# Patient Record
Sex: Female | Born: 1965
Health system: Southern US, Community
[De-identification: ages and names within clinical notes are randomized; demographics above are authoritative.]

## PROBLEM LIST (undated history)

## (undated) DIAGNOSIS — Z87442 Personal history of urinary calculi: Secondary | ICD-10-CM

## (undated) DIAGNOSIS — H269 Unspecified cataract: Secondary | ICD-10-CM

## (undated) DIAGNOSIS — N189 Chronic kidney disease, unspecified: Secondary | ICD-10-CM

## (undated) DIAGNOSIS — R112 Nausea with vomiting, unspecified: Secondary | ICD-10-CM

## (undated) DIAGNOSIS — Z973 Presence of spectacles and contact lenses: Secondary | ICD-10-CM

## (undated) DIAGNOSIS — C801 Malignant (primary) neoplasm, unspecified: Secondary | ICD-10-CM

## (undated) DIAGNOSIS — G971 Other reaction to spinal and lumbar puncture: Secondary | ICD-10-CM

## (undated) DIAGNOSIS — R55 Syncope and collapse: Secondary | ICD-10-CM

## (undated) DIAGNOSIS — K589 Irritable bowel syndrome without diarrhea: Secondary | ICD-10-CM

## (undated) DIAGNOSIS — J42 Unspecified chronic bronchitis: Secondary | ICD-10-CM

## (undated) DIAGNOSIS — E119 Type 2 diabetes mellitus without complications: Secondary | ICD-10-CM

## (undated) DIAGNOSIS — Z8739 Personal history of other diseases of the musculoskeletal system and connective tissue: Secondary | ICD-10-CM

## (undated) DIAGNOSIS — D509 Iron deficiency anemia, unspecified: Secondary | ICD-10-CM

## (undated) DIAGNOSIS — E7800 Pure hypercholesterolemia, unspecified: Secondary | ICD-10-CM

## (undated) DIAGNOSIS — F419 Anxiety disorder, unspecified: Secondary | ICD-10-CM

## (undated) DIAGNOSIS — G6181 Chronic inflammatory demyelinating polyneuritis: Secondary | ICD-10-CM

## (undated) DIAGNOSIS — G4730 Sleep apnea, unspecified: Secondary | ICD-10-CM

## (undated) DIAGNOSIS — E669 Obesity, unspecified: Secondary | ICD-10-CM

## (undated) DIAGNOSIS — R0600 Dyspnea, unspecified: Secondary | ICD-10-CM

## (undated) DIAGNOSIS — M797 Fibromyalgia: Secondary | ICD-10-CM

## (undated) DIAGNOSIS — E039 Hypothyroidism, unspecified: Secondary | ICD-10-CM

## (undated) DIAGNOSIS — I209 Angina pectoris, unspecified: Secondary | ICD-10-CM

## (undated) DIAGNOSIS — K219 Gastro-esophageal reflux disease without esophagitis: Secondary | ICD-10-CM

## (undated) DIAGNOSIS — Z9889 Other specified postprocedural states: Secondary | ICD-10-CM

## (undated) DIAGNOSIS — I10 Essential (primary) hypertension: Secondary | ICD-10-CM

## (undated) DIAGNOSIS — R000 Tachycardia, unspecified: Secondary | ICD-10-CM

## (undated) DIAGNOSIS — R011 Cardiac murmur, unspecified: Secondary | ICD-10-CM

## (undated) DIAGNOSIS — Z8719 Personal history of other diseases of the digestive system: Secondary | ICD-10-CM

## (undated) DIAGNOSIS — D134 Benign neoplasm of liver: Secondary | ICD-10-CM

## (undated) DIAGNOSIS — G43909 Migraine, unspecified, not intractable, without status migrainosus: Secondary | ICD-10-CM

## (undated) DIAGNOSIS — R809 Proteinuria, unspecified: Secondary | ICD-10-CM

## (undated) HISTORY — PX: OTHER SURGICAL HISTORY: SHX169

## (undated) HISTORY — PX: ABDOMINAL HYSTERECTOMY: SHX81

## (undated) HISTORY — PX: BACK SURGERY: SHX140

## (undated) HISTORY — PX: CYSTOSCOPY WITH URETEROSCOPY, STONE BASKETRY AND STENT PLACEMENT: SHX6378

---

## 1998-06-15 ENCOUNTER — Ambulatory Visit (HOSPITAL_COMMUNITY): Admission: RE | Admit: 1998-06-15 | Discharge: 1998-06-15 | Payer: Self-pay | Admitting: Nephrology

## 1999-01-25 ENCOUNTER — Ambulatory Visit (HOSPITAL_COMMUNITY): Admission: RE | Admit: 1999-01-25 | Discharge: 1999-01-25 | Payer: Self-pay | Admitting: Nephrology

## 1999-01-25 ENCOUNTER — Encounter: Payer: Self-pay | Admitting: Nephrology

## 1999-05-29 ENCOUNTER — Encounter: Admission: RE | Admit: 1999-05-29 | Discharge: 1999-07-24 | Payer: Self-pay | Admitting: *Deleted

## 1999-07-10 ENCOUNTER — Encounter: Payer: Self-pay | Admitting: Nephrology

## 1999-07-10 ENCOUNTER — Ambulatory Visit (HOSPITAL_COMMUNITY): Admission: RE | Admit: 1999-07-10 | Discharge: 1999-07-10 | Payer: Self-pay | Admitting: Critical Care Medicine

## 1999-08-15 ENCOUNTER — Other Ambulatory Visit: Admission: RE | Admit: 1999-08-15 | Discharge: 1999-08-15 | Payer: Self-pay | Admitting: Obstetrics and Gynecology

## 1999-11-05 ENCOUNTER — Emergency Department (HOSPITAL_COMMUNITY): Admission: EM | Admit: 1999-11-05 | Discharge: 1999-11-05 | Payer: Self-pay | Admitting: Emergency Medicine

## 1999-11-05 ENCOUNTER — Encounter: Payer: Self-pay | Admitting: Emergency Medicine

## 1999-11-13 ENCOUNTER — Ambulatory Visit (HOSPITAL_COMMUNITY): Admission: RE | Admit: 1999-11-13 | Discharge: 1999-11-13 | Payer: Self-pay | Admitting: *Deleted

## 1999-11-21 ENCOUNTER — Encounter: Admission: RE | Admit: 1999-11-21 | Discharge: 1999-12-06 | Payer: Self-pay | Admitting: *Deleted

## 2000-06-10 ENCOUNTER — Encounter: Payer: Self-pay | Admitting: Emergency Medicine

## 2000-06-10 ENCOUNTER — Emergency Department (HOSPITAL_COMMUNITY): Admission: EM | Admit: 2000-06-10 | Discharge: 2000-06-10 | Payer: Self-pay | Admitting: Emergency Medicine

## 2000-08-14 ENCOUNTER — Other Ambulatory Visit: Admission: RE | Admit: 2000-08-14 | Discharge: 2000-08-14 | Payer: Self-pay | Admitting: Obstetrics and Gynecology

## 2000-10-23 ENCOUNTER — Encounter: Payer: Self-pay | Admitting: Nephrology

## 2000-10-23 ENCOUNTER — Encounter: Admission: RE | Admit: 2000-10-23 | Discharge: 2000-10-23 | Payer: Self-pay | Admitting: Nephrology

## 2001-10-27 ENCOUNTER — Encounter: Payer: Self-pay | Admitting: Nephrology

## 2001-10-27 ENCOUNTER — Encounter: Admission: RE | Admit: 2001-10-27 | Discharge: 2001-10-27 | Payer: Self-pay | Admitting: Nephrology

## 2002-02-08 ENCOUNTER — Emergency Department (HOSPITAL_COMMUNITY): Admission: EM | Admit: 2002-02-08 | Discharge: 2002-02-08 | Payer: Self-pay | Admitting: Emergency Medicine

## 2003-07-07 ENCOUNTER — Encounter: Payer: Self-pay | Admitting: Nephrology

## 2003-07-07 ENCOUNTER — Encounter: Admission: RE | Admit: 2003-07-07 | Discharge: 2003-07-07 | Payer: Self-pay | Admitting: Nephrology

## 2004-05-10 HISTORY — PX: LAPAROSCOPIC CHOLECYSTECTOMY: SUR755

## 2004-05-26 ENCOUNTER — Encounter (INDEPENDENT_AMBULATORY_CARE_PROVIDER_SITE_OTHER): Payer: Self-pay | Admitting: Specialist

## 2004-05-26 ENCOUNTER — Ambulatory Visit (HOSPITAL_COMMUNITY): Admission: RE | Admit: 2004-05-26 | Discharge: 2004-05-27 | Payer: Self-pay | Admitting: General Surgery

## 2004-07-17 ENCOUNTER — Encounter: Admission: RE | Admit: 2004-07-17 | Discharge: 2004-07-17 | Payer: Self-pay | Admitting: Nephrology

## 2005-01-20 ENCOUNTER — Encounter: Admission: RE | Admit: 2005-01-20 | Discharge: 2005-01-20 | Payer: Self-pay | Admitting: Nephrology

## 2005-09-27 ENCOUNTER — Ambulatory Visit (HOSPITAL_COMMUNITY): Admission: RE | Admit: 2005-09-27 | Discharge: 2005-09-27 | Payer: Self-pay | Admitting: Nephrology

## 2005-10-30 ENCOUNTER — Ambulatory Visit: Payer: Self-pay | Admitting: Internal Medicine

## 2006-03-08 ENCOUNTER — Inpatient Hospital Stay (HOSPITAL_COMMUNITY): Admission: AD | Admit: 2006-03-08 | Discharge: 2006-03-10 | Payer: Self-pay | Admitting: Nephrology

## 2006-07-13 ENCOUNTER — Inpatient Hospital Stay (HOSPITAL_COMMUNITY): Admission: EM | Admit: 2006-07-13 | Discharge: 2006-07-17 | Payer: Self-pay | Admitting: Family Medicine

## 2006-07-16 ENCOUNTER — Ambulatory Visit: Payer: Self-pay | Admitting: Infectious Diseases

## 2006-09-16 ENCOUNTER — Ambulatory Visit: Payer: Self-pay | Admitting: Oncology

## 2006-11-13 ENCOUNTER — Encounter (HOSPITAL_COMMUNITY): Admission: RE | Admit: 2006-11-13 | Discharge: 2006-11-13 | Payer: Self-pay | Admitting: Endocrinology

## 2008-06-09 ENCOUNTER — Ambulatory Visit (HOSPITAL_BASED_OUTPATIENT_CLINIC_OR_DEPARTMENT_OTHER): Admission: RE | Admit: 2008-06-09 | Discharge: 2008-06-09 | Payer: Self-pay | Admitting: Neurology

## 2008-06-23 ENCOUNTER — Ambulatory Visit: Payer: Self-pay | Admitting: Pulmonary Disease

## 2008-08-05 ENCOUNTER — Encounter: Admission: RE | Admit: 2008-08-05 | Discharge: 2008-08-05 | Payer: Self-pay | Admitting: Occupational Medicine

## 2008-08-10 ENCOUNTER — Encounter: Admission: RE | Admit: 2008-08-10 | Discharge: 2008-08-10 | Payer: Self-pay | Admitting: Internal Medicine

## 2008-08-11 ENCOUNTER — Ambulatory Visit (HOSPITAL_COMMUNITY): Admission: RE | Admit: 2008-08-11 | Discharge: 2008-08-11 | Payer: Self-pay | Admitting: Nephrology

## 2008-08-12 ENCOUNTER — Encounter: Admission: RE | Admit: 2008-08-12 | Discharge: 2008-09-06 | Payer: Self-pay | Admitting: Internal Medicine

## 2009-03-14 ENCOUNTER — Encounter: Payer: Self-pay | Admitting: Cardiology

## 2009-04-04 ENCOUNTER — Encounter: Payer: Self-pay | Admitting: Cardiology

## 2009-04-09 HISTORY — PX: CARDIAC CATHETERIZATION: SHX172

## 2009-04-14 ENCOUNTER — Ambulatory Visit: Payer: Self-pay | Admitting: Cardiology

## 2009-04-14 ENCOUNTER — Encounter (INDEPENDENT_AMBULATORY_CARE_PROVIDER_SITE_OTHER): Payer: Self-pay

## 2009-04-14 DIAGNOSIS — J449 Chronic obstructive pulmonary disease, unspecified: Secondary | ICD-10-CM | POA: Insufficient documentation

## 2009-04-14 DIAGNOSIS — F341 Dysthymic disorder: Secondary | ICD-10-CM | POA: Insufficient documentation

## 2009-04-14 DIAGNOSIS — I10 Essential (primary) hypertension: Secondary | ICD-10-CM | POA: Insufficient documentation

## 2009-04-14 DIAGNOSIS — J4530 Mild persistent asthma, uncomplicated: Secondary | ICD-10-CM | POA: Insufficient documentation

## 2009-04-14 DIAGNOSIS — E119 Type 2 diabetes mellitus without complications: Secondary | ICD-10-CM | POA: Insufficient documentation

## 2009-04-14 DIAGNOSIS — R079 Chest pain, unspecified: Secondary | ICD-10-CM | POA: Insufficient documentation

## 2009-04-14 DIAGNOSIS — E669 Obesity, unspecified: Secondary | ICD-10-CM | POA: Insufficient documentation

## 2009-04-14 DIAGNOSIS — Z8679 Personal history of other diseases of the circulatory system: Secondary | ICD-10-CM | POA: Insufficient documentation

## 2009-04-14 DIAGNOSIS — E1129 Type 2 diabetes mellitus with other diabetic kidney complication: Secondary | ICD-10-CM | POA: Insufficient documentation

## 2009-04-14 DIAGNOSIS — E785 Hyperlipidemia, unspecified: Secondary | ICD-10-CM | POA: Insufficient documentation

## 2009-04-20 ENCOUNTER — Inpatient Hospital Stay (HOSPITAL_BASED_OUTPATIENT_CLINIC_OR_DEPARTMENT_OTHER): Admission: RE | Admit: 2009-04-20 | Discharge: 2009-04-20 | Payer: Self-pay | Admitting: Cardiology

## 2009-04-20 ENCOUNTER — Ambulatory Visit: Payer: Self-pay | Admitting: Cardiology

## 2009-04-28 ENCOUNTER — Ambulatory Visit: Payer: Self-pay | Admitting: Cardiology

## 2009-05-26 ENCOUNTER — Ambulatory Visit (HOSPITAL_COMMUNITY): Admission: RE | Admit: 2009-05-26 | Discharge: 2009-05-26 | Payer: Self-pay | Admitting: Rheumatology

## 2009-07-01 ENCOUNTER — Inpatient Hospital Stay (HOSPITAL_COMMUNITY): Admission: EM | Admit: 2009-07-01 | Discharge: 2009-07-04 | Payer: Self-pay | Admitting: Emergency Medicine

## 2009-07-13 ENCOUNTER — Ambulatory Visit: Payer: Self-pay | Admitting: Internal Medicine

## 2009-07-14 ENCOUNTER — Ambulatory Visit: Payer: Self-pay | Admitting: Cardiology

## 2010-02-09 ENCOUNTER — Emergency Department (HOSPITAL_COMMUNITY): Admission: EM | Admit: 2010-02-09 | Discharge: 2010-02-09 | Payer: Self-pay | Admitting: Family Medicine

## 2010-06-22 ENCOUNTER — Ambulatory Visit (HOSPITAL_COMMUNITY): Admission: RE | Admit: 2010-06-22 | Discharge: 2010-06-22 | Payer: Self-pay | Admitting: Physical Therapy

## 2010-08-02 IMAGING — CR DG CHEST 1V PORT
1 series · 1 of 1 positions shown · non-contrast
Comparison: 07/14/2006.

CLINICAL DATA: Tachycardia.  Diabetic.  High blood pressure.

PORTABLE CHEST - 1 VIEW

[AP]
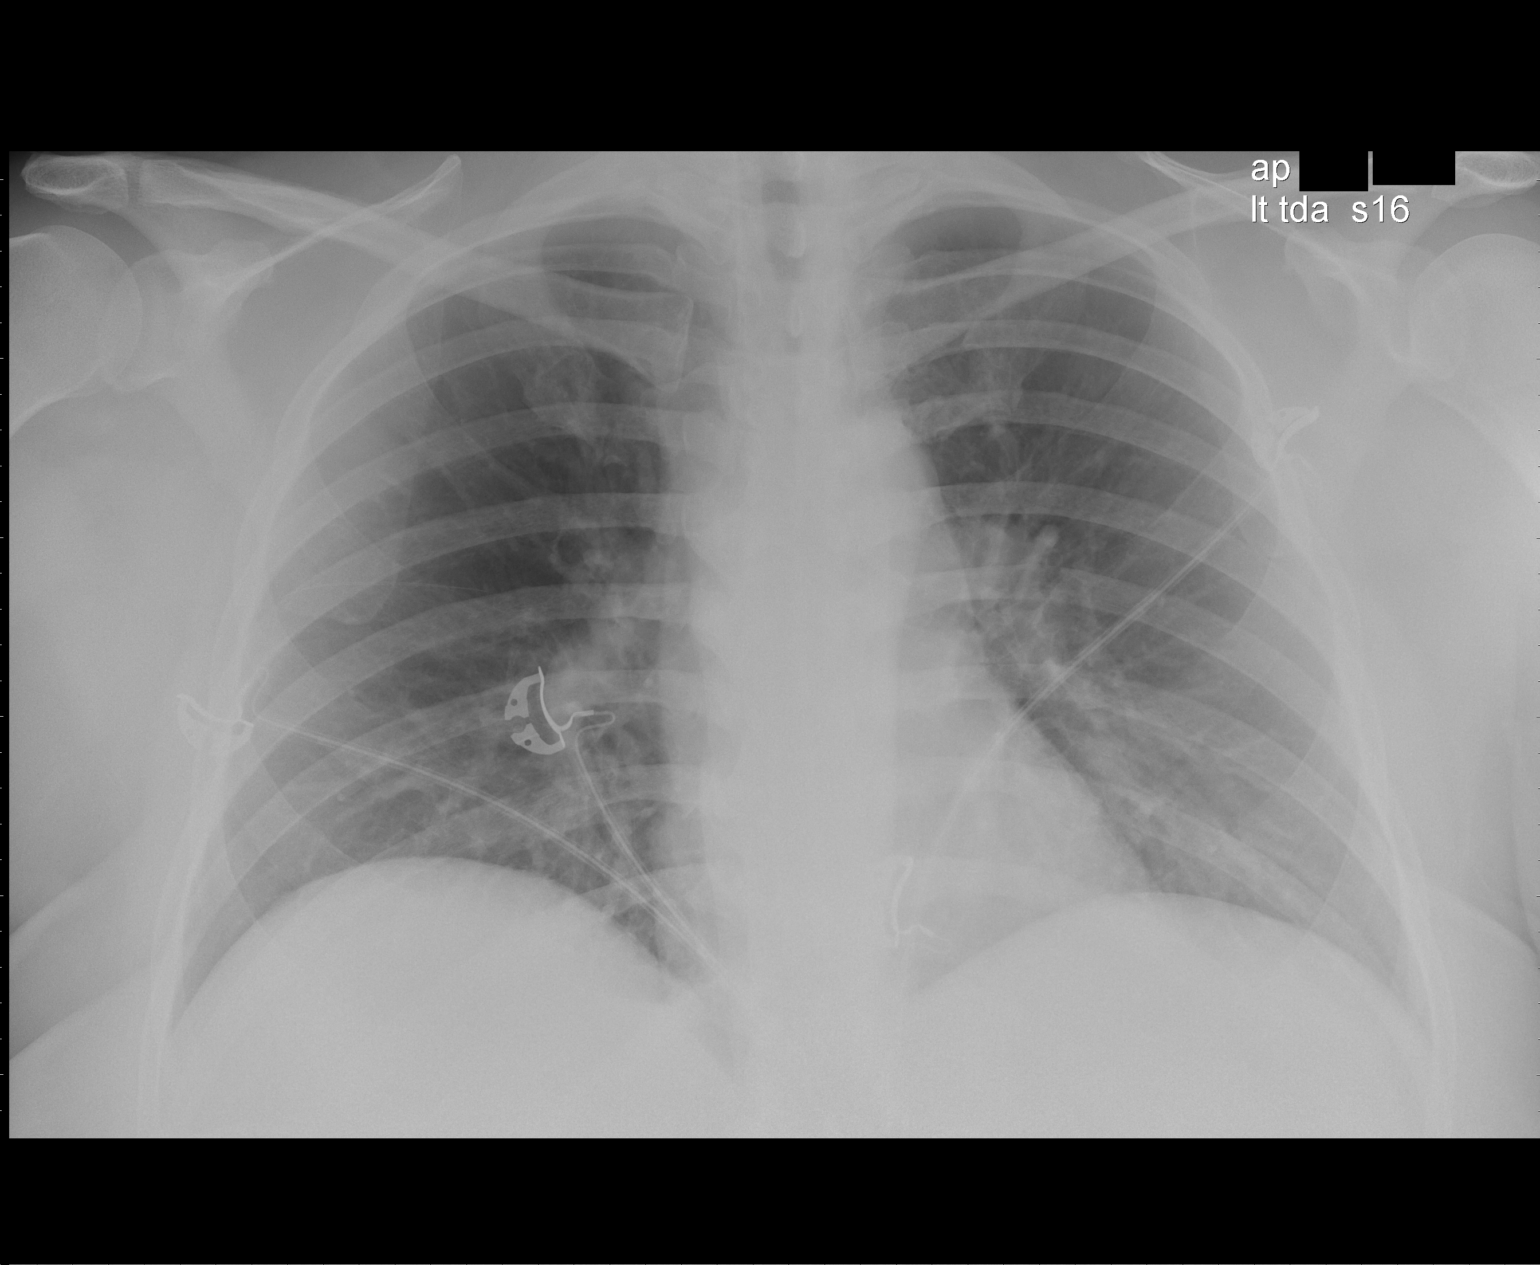

[1 of 1 positions shown; findings below may reference images not displayed]

FINDINGS: Central pulmonary vascular congestion. No infiltrate,
congestive heart failure or pneumothorax.  Heart size within
normal limits. The patient would eventually benefit from follow-up
two-view chest with cardiac leads removed.  Mild prominence of the
aortic knob.
IMPRESSION: Mild central pulmonary vascular prominence.

## 2010-09-08 ENCOUNTER — Ambulatory Visit (HOSPITAL_COMMUNITY): Admission: RE | Admit: 2010-09-08 | Discharge: 2010-09-08 | Payer: Self-pay | Admitting: Nephrology

## 2010-11-25 ENCOUNTER — Emergency Department (HOSPITAL_COMMUNITY)
Admission: EM | Admit: 2010-11-25 | Discharge: 2010-11-25 | Payer: Self-pay | Source: Home / Self Care | Admitting: Family Medicine

## 2010-11-27 ENCOUNTER — Emergency Department (HOSPITAL_COMMUNITY)
Admission: EM | Admit: 2010-11-27 | Discharge: 2010-11-27 | Payer: Self-pay | Source: Home / Self Care | Admitting: Emergency Medicine

## 2010-12-31 ENCOUNTER — Encounter: Payer: Self-pay | Admitting: Nephrology

## 2011-01-07 LAB — CONVERTED CEMR LAB
BUN: 10 mg/dL (ref 6–23)
Basophils Absolute: 0 10*3/uL (ref 0.0–0.1)
Basophils Relative: 0.6 % (ref 0.0–3.0)
CO2: 27 meq/L (ref 19–32)
Calcium: 9.6 mg/dL (ref 8.4–10.5)
Chloride: 104 meq/L (ref 96–112)
Creatinine, Ser: 0.7 mg/dL (ref 0.4–1.2)
Eosinophils Absolute: 0.1 10*3/uL (ref 0.0–0.7)
Eosinophils Relative: 1.3 % (ref 0.0–5.0)
GFR calc non Af Amer: 97.73 mL/min (ref >60)
Glucose, Bld: 60 mg/dL — ABNORMAL LOW (ref 70–99)
HCT: 34.3 % — ABNORMAL LOW (ref 36.0–46.0)
Hemoglobin: 11.4 g/dL — ABNORMAL LOW (ref 12.0–15.0)
INR: 1 (ref 0.8–1.0)
Lymphocytes Relative: 34.3 % (ref 12.0–46.0)
Lymphs Abs: 1.7 10*3/uL (ref 0.7–4.0)
MCHC: 33.3 g/dL (ref 30.0–36.0)
MCV: 79.1 fL (ref 78.0–100.0)
Monocytes Absolute: 0.5 10*3/uL (ref 0.1–1.0)
Monocytes Relative: 10 % (ref 3.0–12.0)
Neutro Abs: 2.7 10*3/uL (ref 1.4–7.7)
Neutrophils Relative %: 53.8 % (ref 43.0–77.0)
Platelets: 278 10*3/uL (ref 150.0–400.0)
Potassium: 3.5 meq/L (ref 3.5–5.1)
Prothrombin Time: 11.3 s (ref 10.9–13.3)
RBC: 4.33 M/uL (ref 3.87–5.11)
RDW: 14.6 % (ref 11.5–14.6)
Sodium: 140 meq/L (ref 135–145)
WBC: 5 10*3/uL (ref 4.5–10.5)

## 2011-01-11 NOTE — Assessment & Plan Note (Signed)
Summary: f21m   Visit Type:  Follow-up  CC:  Post-hospital.  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  Felt like she would pass out.  BP was 115/70, but HR was 150.  Had temp of 99.  Took Toprol, and called out at work.  She went to the ED at [facility] and spiked a temp to 101.8.  BP was 90/50 and 80/50, but got fluids.  Got tylenol, and fluids,  Was on 2600 on Friday until Monday.  Was on service of Dr. Harvie Junior.  She was doing well until this.  Was signed out as possibly having RMSF.  Treated with antibiotics.  Current Medications (verified): 1)  Aspirin 81 Mg Tbec (Aspirin) .... Take One Tablet By Mouth Daily 2)  Lasix 40 Mg Tabs (Furosemide) .... Take 1 Tablet By Mouth Once A Day 3)  Toprol Xl 50 Mg Xr24h-Tab (Metoprolol Succinate) .... Take 1 Tablet By Mouth Two Times A Day 4)  Prinivil 40mg  Tablet .... Take 1 Tablet By Mouth Two Times A Day 5)  Glucophage 1000 Mg Tabs (Metformin Hcl) .... Take 1 Tablet By Mouth Two Times A Day 6)  Actos 45 Mg Tabs (Pioglitazone Hcl) .... Take 1 Tablet By Mouth Once A Day 7)  Victoza 1.8 Mcg .... Subcutaneously Daily 8)  Humulin R U-500 (Concentrated) 500 Unit/ml Soln (Insulin Regular Human) .... As Directed 9)  Tricor 145 Mg Tabs (Fenofibrate) .... Take 1 Tablet By Mouth Once A Day 10)  Lovaza 1 Gm Caps (Omega-3-Acid Ethyl Esters) .... 4 Capsules Daily 11)  Crestor 20 Mg Tabs (Rosuvastatin Calcium) .... Take One Tablet By Mouth Daily. 12)  Welchol 625 Mg Tabs (Colesevelam Hcl) .... 6 Tablets Daily 13)  Vitamin D 50,000 Units Cap .... One Capsule 3 Times A Week 14)  Nexium 40 Mg Cpdr (Esomeprazole Magnesium) .... Take 1 Capsule By Mouth Once A Day 15)  Bentyl 10 Mg Caps (Dicyclomine Hcl) .... Take 1 Cap Two Times A Day or Three Times A Day 16)  Levsin 0.5 Mg/ml Soln (Hyoscyamine Sulfate) .... 2 Tablets Two Times A Day or Qid 17)  Prozac 40 Mg Caps (Fluoxetine Hcl) .... Take 1 Capsule By Mouth Once A Day 18)   Zyrtec Allergy 10 Mg Tabs (Cetirizine Hcl) .... Take 1 Tablet By Mouth Once A Day At Bedtime 19)  Symbicort 160-4.5 Mcg/act Aero (Budesonide-Formoterol Fumarate) .Marland Kitchen.. 1 Puff Two Times A Day 20)  Xopenex Hfa 45 Mcg/act Aero (Levalbuterol Tartrate) .... 2 Puffs Q. 4-6 Hours As Needed 21)  Tums Ultra 1000 1000 Mg Chew (Calcium Carbonate Antacid) .... 2 Tabs At Bedtime 22)  L-Lysine Hcl 500 Mg Caps (Lysine Hcl) .... Take 1 Capsule By Mouth Once A Day At Bedtime 23)  Centrum Silver  Tabs (Multiple Vitamins-Minerals) .... Take 1 Tablet By Mouth Once A Day At Bedtime  Allergies: 1)  ! Penicillin 2)  ! * Avelox 3)  ! Biaxin 4)  ! * Latex 5)  ! * Milk  Vital Signs:  Patient profile:   45 year old female Height:      66 inches Weight:      264 pounds BMI:     42.76 Pulse rate:   95 / minute Pulse rhythm:   regular Resp:     20 per minute BP sitting:   128 / 60  (left arm) Cuff size:   large  Vitals Entered By: Vikki Ports (July 14, 2009  2:29 PM)  Physical Exam  General:  Well developed, well nourished, in no acute distress. Head:  normocephalic and atraumatic Lungs:  Clear bilaterally to auscultation and percussion. Heart:  Non-displaced PMI, chest non-tender; regular rate and rhythm, S1, S2 without murmurs, rubs or gallops. Carotid upstroke normal, no bruit. Normal abdominal aortic size, no bruits. Femorals normal pulses, no bruits. Pedals normal pulses. No edema, no varicosities. Extremities:  No clubbing or cyanosis.   EKG  Procedure date:  07/14/2009  Findings:      NSR.  Slight QT prolongation.  Impression & Recommendations:  Problem # 1:  CHEST PAIN (ICD-786.50)  Non further.  Stable. Her updated medication list for this problem includes:    Aspirin 81 Mg Tbec (Aspirin) .Marland Kitchen... Take one tablet by mouth daily    Toprol Xl 50 Mg Xr24h-tab (Metoprolol succinate) .Marland Kitchen... Take 1 tablet by mouth two times a day    Prinivil 20 Mg Tabs (Lisinopril) .Marland Kitchen... Take one tablet by  mouth daily  Her updated medication list for this problem includes:    Aspirin 81 Mg Tbec (Aspirin) .Marland Kitchen... Take one tablet by mouth daily    Toprol Xl 50 Mg Xr24h-tab (Metoprolol succinate) .Marland Kitchen... Take 1 tablet by mouth two times a day    Prinivil 20 Mg Tabs (Lisinopril) .Marland Kitchen... Take one tablet by mouth daily  Orders: EKG w/ Interpretation (93000)  Problem # 2:  HYPERTENSION (ICD-401.9)  Meds held in hospital.  Can restart Prinivil, and then check BP daily, with slow uptitration as needed.  Suggested followup with Dr. Darrick Penna. Her updated medication list for this problem includes:    Aspirin 81 Mg Tbec (Aspirin) .Marland Kitchen... Take one tablet by mouth daily    Lasix 40 Mg Tabs (Furosemide) .Marland Kitchen... Take 1/2 tablet by mouth once a day    Toprol Xl 50 Mg Xr24h-tab (Metoprolol succinate) .Marland Kitchen... Take 1 tablet by mouth two times a day    Prinivil 20 Mg Tabs (Lisinopril) .Marland Kitchen... Take one tablet by mouth daily  Orders: EKG w/ Interpretation (93000)  Patient Instructions: 1)  Your physician recommends that you schedule a follow-up appointment as needed. 2)  Your physician has recommended you make the following change in your medication: Decrease Prinivil to 20mg  once a day, Cut current Furosemide dose in half 3)  Your physician has requested that you regularly monitor and record your blood pressure readings at home.  Please use the same machine at the same time of day to check your readings and record them to bring to your follow-up visit.

## 2011-01-11 NOTE — Letter (Signed)
Summary: Cardiac Catheterization Instructions- [facility]  [facility], Main Office  [address]   [place], [place] [address]   Phone: [phone]  Fax: [phone]     04/14/2009 MRN: [id number]  [facility] [address] [place], [place]  [address]  Dear [name],   You are scheduled for a Cardiac Catheterization on Wednesday Apr 20, 2009 with Dr.Stuckey.  Please arrive to the 1st floor of the Heart and Vascular Center at [facility] at 9:30 am on the day of your procedure. Please do not arrive before 6:30 a.m. Call the Heart and Vascular Center at [phone] if you are unable to make your appointmnet. The Code to get into the parking garage under the building is 0070. Take the elevators to the 1st floor. You must have someone to drive you home. Someone must be with you for the first 24 hours after you arrive home. Please wear clothes that are easy to get on and off and wear slip-on shoes. Do not eat or drink after midnight except water with your medications that morning. Bring all your medications and current insurance cards with you.  _x__ DO NOT take these medications before your procedure: DO NOT TAKE LASIX, ACTOS THE MORNING OF PROCEDURE. DO NOT TAKE GLUCOPHAGE THE NIGHT BEFORE, MORNING OF, OR 48 HOURS AFTER PROCEDURE. DO NOT TAKE INSULIN THE MORNING OF PROCEDURE, IF YOU TAKE INSULIN AT NIGHT TAKE HALF OF YOUR NORMAL DOSE.  __X_ Make sure you take your aspirin.  __X_ You may take ALL of your medications with water that morning. ________________________________________________________________________________________________________________________________  ___ DO NOT take ANY medications before your procedure.  ___ Pre-med instructions:  ________________________________________________________________________________________________________________________________  The usual length of stay after your procedure is 2 to 3 hours. This can  vary.  If you have any questions, please call the office at the number listed above.   Julieta Gutting, RN, BSN

## 2011-01-11 NOTE — Assessment & Plan Note (Signed)
Summary: f30m   Visit Type:  Follow-up  CC:  Chest pains and Sob.  History of Present Illness: Still has some chest pain and is short of breath.  Patient is less anxious about the test.  Study reviewed in the office today in detail anatomically.  Notices chest discomfort more when she is really stressed.  She pulled three twelves, and pain was worse at work.  Alot of it is stress related.  She started paying attention to the pattern, and noticed it was associated with this.  Now improved.  Cath revealed no significant evidence of CAD.  Current Medications (verified): 1)  Aspirin 81 Mg Tbec (Aspirin) .... Take One Tablet By Mouth Daily 2)  Lasix 40 Mg Tabs (Furosemide) .... Take 1 Tablet By Mouth Once A Day 3)  Toprol Xl 50 Mg Xr24h-Tab (Metoprolol Succinate) .... Take 1 Tablet By Mouth Two Times A Day 4)  Prinivil 40mg  Tablet .... Take 1 Tablet By Mouth Two Times A Day 5)  Glucophage 1000 Mg Tabs (Metformin Hcl) .... Take 1 Tablet By Mouth Two Times A Day 6)  Actos 45 Mg Tabs (Pioglitazone Hcl) .... Take 1 Tablet By Mouth Once A Day 7)  Victoza 1.8 Mcg .... Subcutaneously Daily 8)  Humulin R U-500 (Concentrated) 500 Unit/ml Soln (Insulin Regular Human) .... As Directed 9)  Tricor 145 Mg Tabs (Fenofibrate) .... Take 1 Tablet By Mouth Once A Day 10)  Lovaza 1 Gm Caps (Omega-3-Acid Ethyl Esters) .... 4 Capsules Daily 11)  Crestor 20 Mg Tabs (Rosuvastatin Calcium) .... Take One Tablet By Mouth Daily. 12)  Welchol 625 Mg Tabs (Colesevelam Hcl) .... 6 Tablets Daily 13)  Vitamin D 50,000 Units Cap .... One Capsule 3 Times A Week 14)  Nexium 40 Mg Cpdr (Esomeprazole Magnesium) .... Take 1 Capsule By Mouth Once A Day 15)  Bentyl 10 Mg Caps (Dicyclomine Hcl) .... Take 1 Cap Two Times A Day or Three Times A Day 16)  Levsin 0.5 Mg/ml Soln (Hyoscyamine Sulfate) .... 2 Tablets Two Times A Day or Qid 17)  Prozac 40 Mg Caps (Fluoxetine Hcl) .... Take 1 Capsule By Mouth Once A Day 18)  Zyrtec Allergy 10 Mg  Tabs (Cetirizine Hcl) .... Take 1 Tablet By Mouth Once A Day At Bedtime 19)  Symbicort 160-4.5 Mcg/act Aero (Budesonide-Formoterol Fumarate) .Marland Kitchen.. 1 Puff Two Times A Day 20)  Xopenex Hfa 45 Mcg/act Aero (Levalbuterol Tartrate) .... 2 Puffs Q. 4-6 Hours As Needed 21)  Tums Ultra 1000 1000 Mg Chew (Calcium Carbonate Antacid) .... 2 Tabs At Bedtime 22)  L-Lysine Hcl 500 Mg Caps (Lysine Hcl) .... Take 1 Capsule By Mouth Once A Day At Bedtime 23)  Centrum Silver  Tabs (Multiple Vitamins-Minerals) .... Take 1 Tablet By Mouth Once A Day At Bedtime  Allergies: 1)  ! Penicillin 2)  ! * Avelox 3)  ! Biaxin 4)  ! * Latex 5)  ! * Milk  Vital Signs:  Patient profile:   45 year old female Height:      66 inches Weight:      255.25 pounds BMI:     41.35 Pulse rate:   104 / minute Pulse rhythm:   irregular Resp:     18 per minute BP sitting:   110 / 70  (left arm) Cuff size:   large  Vitals Entered By: Vikki Ports (Apr 28, 2009 5:10 PM)  Physical Exam  General:  Well developed, well nourished, in no acute distress. Lungs:  Clear  to a and p Heart:  Non-displaced PMI, chest non-tender; regular rate and rhythm, S1, S2 without murmurs, rubs or gallops. Carotid upstroke normal, no bruit. Normal abdominal aortic size, no bruits. Femorals normal pulses, no bruits. Pedals normal pulses. No edema, no varicosities. Extremities:  No clubbing or cyanosis.  Groin stable   EKG  Procedure date:  04/28/2009  Findings:      Sinus tachycardia  Impression & Recommendations:  Problem # 1:  CHEST PAIN (ICD-786.50) Continue current medications.  Cath study with no definite evidence of CAD.  See report.  Did encourage followup with GI, discussion about various potential causes, we instructed her to reduce her [facility] use.  Encouarged regular exercise with parameters, and 2 month followup.  Followup also with Dr. Darrick Penna.  EKG w/ Interpretation (93000)  Problem # 2:  TACHYCARDIA, HX OF  (ICD-V12.50) As above. Orders: EKG w/ Interpretation (93000)  Patient Instructions: 1)  Your physician recommends that you schedule a follow-up appointment in: 2 months 2)  Your physician recommends that you continue on your current medications as directed. Please refer to the Current Medication list given to you today.  3)  Weigh daily and keep a record.  Bring to your next appt. 4)  Stop [facility]. This will help tachycardia and reflux symptoms.

## 2011-01-11 NOTE — Letter (Signed)
Summary: [facility] Cardiopulmonary Note  [facility] Cardiopulmonary Note   Imported By: Roderic Ovens 07/01/2009 13:05:01  _____________________________________________________________________  External Attachment:    Type:   Image     Comment:   External Document

## 2011-01-11 NOTE — Medication Information (Signed)
Summary: Medication List  Medication List   Imported By: Roderic Ovens 07/01/2009 12:48:28  _____________________________________________________________________  External Attachment:    Type:   Image     Comment:   External Document

## 2011-01-11 NOTE — Letter (Signed)
Summary: [facility] Note  [facility] Note   Imported By: Roderic Ovens 07/01/2009 13:02:06  _____________________________________________________________________  External Attachment:    Type:   Image     Comment:   External Document

## 2011-01-11 NOTE — Assessment & Plan Note (Signed)
Summary: np6/dx:chest pain/high risk/appt @12 :30/g   Visit Type:  Initial Consult  CC:  Chest pains- Sob- palpitations.  History of Present Illness: [name] is a 45 year old female referred by Dr. Darrick Penna for evaluation of chest pain.  She has had a history of multiple medical issues, followed by a variety of MDs.  She has a three month history of gradually progressive chest discomfort associated with exertion.  She gets on a treadmill and will have pain.  Over the holiday weekend of Easter, the pain became more intense with some radiation and shortness of breath and she was admitted to [facility].  Enzymes were negative.  CT angio was negative for PE.  Radionuclide imaging without regular stress was negative for ischemia with an EF of 63%.  Echo was normal.  They discussed a CT angio of the coronaries, but she cancelled the study.  A calcium score for CAD was 0.  She saw Dr. Darrick Penna, complained of continued symptoms, and now is referred for further evaluation.  The pain is central, with some radiation at times.  She has shortness of breath.  She denies vomiting.  The symptoms have progressed.    She demands to know why.    Current Medications (verified): 1)  Aspirin 81 Mg Tbec (Aspirin) .... Take One Tablet By Mouth Daily 2)  Lasix 40 Mg Tabs (Furosemide) .... Take 1 Tablet By Mouth Once A Day 3)  Toprol Xl 50 Mg Xr24h-Tab (Metoprolol Succinate) .... Take 1 Tablet By Mouth Two Times A Day 4)  Prinivil 40mg  Tablet .... Take 1 Tablet By Mouth Two Times A Day 5)  Glucophage 1000 Mg Tabs (Metformin Hcl) .... Take 1 Tablet By Mouth Two Times A Day 6)  Actos 45 Mg Tabs (Pioglitazone Hcl) .... Take 1 Tablet By Mouth Once A Day 7)  Victoza 1.8 Mcg .... Subcutaneously Daily 8)  Humulin R U-500 (Concentrated) 500 Unit/ml Soln (Insulin Regular Human) .... As Directed 9)  Tricor 145 Mg Tabs (Fenofibrate) .... Take 1 Tablet By Mouth Once A Day 10)  Lovaza 1 Gm Caps (Omega-3-Acid Ethyl  Esters) .... 4 Capsules Daily 11)  Crestor 20 Mg Tabs (Rosuvastatin Calcium) .... Take One Tablet By Mouth Daily. 12)  Welchol 625 Mg Tabs (Colesevelam Hcl) .... 6 Tablets Daily 13)  Vitamin D 50,000 Units Cap .... One Capsule 3 Times A Week 14)  Nexium 40 Mg Cpdr (Esomeprazole Magnesium) .... Take 1 Capsule By Mouth Once A Day 15)  Bentyl 10 Mg Caps (Dicyclomine Hcl) .... Take 1 Cap Two Times A Day or Three Times A Day 16)  Levsin 0.5 Mg/ml Soln (Hyoscyamine Sulfate) .... 2 Tablets Two Times A Day or Qid 17)  Prozac 40 Mg Caps (Fluoxetine Hcl) .... Take 1 Capsule By Mouth Once A Day 18)  Zyrtec Allergy 10 Mg Tabs (Cetirizine Hcl) .... Take 1 Tablet By Mouth Once A Day At Bedtime 19)  Symbicort 160-4.5 Mcg/act Aero (Budesonide-Formoterol Fumarate) .Marland Kitchen.. 1 Puff Two Times A Day 20)  Xopenex Hfa 45 Mcg/act Aero (Levalbuterol Tartrate) .... 2 Puffs Q. 4-6 Hours As Needed 21)  Tums Ultra 1000 1000 Mg Chew (Calcium Carbonate Antacid) .... 2 Tabs At Bedtime 22)  L-Lysine Hcl 500 Mg Caps (Lysine Hcl) .... Take 1 Capsule By Mouth Once A Day At Bedtime 23)  Centrum Silver  Tabs (Multiple Vitamins-Minerals) .... Take 1 Tablet By Mouth Once A Day At Bedtime  Allergies (verified): 1)  ! Penicillin 2)  ! * Avelox 3)  !  Biaxin 4)  ! * Latex 5)  ! 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  Review of Systems       The patient complains of weight gain, chest pain, muscle weakness, and unusual weight change.    Vital  Signs:  Patient profile:   44 year old female Height:      66 inches Weight:      253 pounds BMI:     40.98 Pulse rate:   103 / minute Pulse rhythm:   regular Resp:     18 per minute BP sitting:   122 / 50  (left arm) Cuff size:   large  Vitals Entered By: Vikki Ports (Apr 14, 2009 12:30 PM)  Physical Exam  General:  Well developed, well nourished, in no acute distress. Head:  normocephalic and atraumatic Eyes:  PERRLA/EOM intact; conjunctiva and lids normal. Lungs:  Clear bilaterally to auscultation and percussion. Heart:  Non-displaced PMI, chest non-tender; regular rate and rhythm, S1, S2 without murmurs, rubs or gallops. Carotid upstroke normal, no bruit. Normal abdominal aortic size, no bruits. Femorals normal pulses, no bruits. Pedals normal pulses. No edema, no varicosities. Pulses:  pulses normal in all 4 extremities Extremities:  No clubbing or cyanosis. Psych:  Seems anxious.   Impression & Recommendations:  Problem # 1:  CHEST PAIN (ICD-786.50) Symptoms are anginal in nature.  Occur with exertion.  Nuc study negative, calcium score normal, low likelihood of CAD, but continued chest pain, and needs to know.  Options and alternatives discussed with patient in great detail.  Based on risk factors, continued and progressive symptomatology, risks of catheterization appear appropriate.  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  Patient agreeable to proceed.   Orders: EKG w/ Interpretation (93000) Cardiac Catheterization (Cardiac Cath) TLB-PT (Protime) (85610-PTP) TLB-BMP (Basic Metabolic Panel-BMET) (80048-METABOL) TLB-CBC Platelet - w/Differential (85025-CBCD)  Problem # 2:  HYPERTENSION (ICD-401.9) Continue current medications. Her updated medication list for this problem includes:    Aspirin 81 Mg Tbec (Aspirin) .Marland Kitchen... Take one tablet by mouth daily    Lasix 40 Mg Tabs (Furosemide) .Marland Kitchen... Take 1 tablet by mouth once a  day    Toprol Xl 50 Mg Xr24h-tab (Metoprolol succinate) .Marland Kitchen... Take 1 tablet by mouth two times a day  Orders: EKG w/ Interpretation (93000) Cardiac Catheterization (Cardiac Cath) TLB-PT (Protime) (85610-PTP) TLB-BMP (Basic Metabolic Panel-BMET) (80048-METABOL) TLB-CBC Platelet - w/Differential (85025-CBCD)  Problem # 3:  HYPERLIPIDEMIA (ICD-272.4) Continue same for now. Her updated medication list for this problem includes:    Tricor 145 Mg Tabs (Fenofibrate) .Marland Kitchen... Take 1 tablet by mouth once a day    Lovaza 1 Gm Caps (Omega-3-acid ethyl esters) .Marland KitchenMarland KitchenMarland KitchenMarland Kitchen 4 capsules daily    Crestor 20 Mg Tabs (Rosuvastatin calcium) .Marland KitchenMarland KitchenMarland KitchenMarland Kitchen  Take one tablet by mouth daily.    Welchol 625 Mg Tabs (Colesevelam hcl) .Marland KitchenMarland KitchenMarland KitchenMarland Kitchen 6 tablets daily  Problem # 4:  TACHYCARDIA, HX OF (ICD-V12.50) Evaluate potential etiologies after catheterization procedure.  Patient Instructions: 1)  Your physician has requested that you have a cardiac catheterization.  Cardiac catheterization is used to diagnose and/or treat various heart conditions. Doctors may recommend this procedure for a number of different reasons. The most common reason is to evaluate chest pain. Chest pain can be a symptom of coronary artery disease (CAD), and cardiac catheterization can show whether plaque is narrowing or blocking your heart's arteries. This procedure is also used to evaluate the valves, as well as measure the blood flow and oxygen levels in different parts of your heart.  For further information please visit [email or web address]  Please follow instruction sheet, as given.

## 2011-01-11 NOTE — Letter (Signed)
Summary: [facility] Note  [facility] Note   Imported By: Roderic Ovens 07/01/2009 12:53:39  _____________________________________________________________________  External Attachment:    Type:   Image     Comment:   External Document

## 2011-01-11 NOTE — Cardiovascular Report (Signed)
Summary: Pre Cath Orders  Pre Cath Orders   Imported By: Roderic Ovens 05/23/2009 15:52:10  _____________________________________________________________________  External Attachment:    Type:   Image     Comment:   External Document

## 2011-01-11 NOTE — Letter (Signed)
Summary: [facility] Note  [facility] Note   Imported By: Roderic Ovens 07/01/2009 12:52:59  _____________________________________________________________________  External Attachment:    Type:   Image     Comment:   External Document

## 2011-02-19 LAB — GLUCOSE, CAPILLARY: Glucose-Capillary: 62 mg/dL — ABNORMAL LOW (ref 70–99)

## 2011-03-18 LAB — BASIC METABOLIC PANEL
BUN: 5 mg/dL — ABNORMAL LOW (ref 6–23)
BUN: 6 mg/dL (ref 6–23)
BUN: 8 mg/dL (ref 6–23)
CO2: 23 mEq/L (ref 19–32)
CO2: 24 mEq/L (ref 19–32)
CO2: 25 mEq/L (ref 19–32)
Calcium: 7.8 mg/dL — ABNORMAL LOW (ref 8.4–10.5)
Calcium: 8.2 mg/dL — ABNORMAL LOW (ref 8.4–10.5)
Calcium: 8.5 mg/dL (ref 8.4–10.5)
Chloride: 106 mEq/L (ref 96–112)
Chloride: 108 mEq/L (ref 96–112)
Chloride: 108 mEq/L (ref 96–112)
Creatinine, Ser: 0.61 mg/dL (ref 0.4–1.2)
Creatinine, Ser: 0.62 mg/dL (ref 0.4–1.2)
Creatinine, Ser: 0.7 mg/dL (ref 0.4–1.2)
GFR calc Af Amer: >60 mL/min (ref >60)
GFR calc Af Amer: >60 mL/min (ref >60)
GFR calc Af Amer: >60 mL/min (ref >60)
GFR calc non Af Amer: >60 mL/min (ref >60)
GFR calc non Af Amer: >60 mL/min (ref >60)
GFR calc non Af Amer: >60 mL/min (ref >60)
Glucose, Bld: 64 mg/dL — ABNORMAL LOW (ref 70–99)
Glucose, Bld: 68 mg/dL — ABNORMAL LOW (ref 70–99)
Glucose, Bld: 94 mg/dL (ref 70–99)
Potassium: 3.2 mEq/L — ABNORMAL LOW (ref 3.5–5.1)
Potassium: 3.4 mEq/L — ABNORMAL LOW (ref 3.5–5.1)
Potassium: 3.7 mEq/L (ref 3.5–5.1)
Sodium: 136 mEq/L (ref 135–145)
Sodium: 137 mEq/L (ref 135–145)
Sodium: 139 mEq/L (ref 135–145)

## 2011-03-18 LAB — URINE MICROSCOPIC-ADD ON

## 2011-03-18 LAB — PREGNANCY, URINE: Preg Test, Ur: NEGATIVE

## 2011-03-18 LAB — CBC
HCT: 27.8 % — ABNORMAL LOW (ref 36.0–46.0)
HCT: 29.3 % — ABNORMAL LOW (ref 36.0–46.0)
HCT: 30.3 % — ABNORMAL LOW (ref 36.0–46.0)
HCT: 30.9 % — ABNORMAL LOW (ref 36.0–46.0)
Hemoglobin: 10.3 g/dL — ABNORMAL LOW (ref 12.0–15.0)
Hemoglobin: 9.3 g/dL — ABNORMAL LOW (ref 12.0–15.0)
Hemoglobin: 9.7 g/dL — ABNORMAL LOW (ref 12.0–15.0)
Hemoglobin: 9.9 g/dL — ABNORMAL LOW (ref 12.0–15.0)
MCHC: 32.7 g/dL (ref 30.0–36.0)
MCHC: 33.1 g/dL (ref 30.0–36.0)
MCHC: 33.3 g/dL (ref 30.0–36.0)
MCHC: 33.3 g/dL (ref 30.0–36.0)
MCV: 74.1 fL — ABNORMAL LOW (ref 78.0–100.0)
MCV: 74.6 fL — ABNORMAL LOW (ref 78.0–100.0)
MCV: 74.7 fL — ABNORMAL LOW (ref 78.0–100.0)
MCV: 74.8 fL — ABNORMAL LOW (ref 78.0–100.0)
Platelets: 172 10*3/uL (ref 150–400)
Platelets: 174 10*3/uL (ref 150–400)
Platelets: 185 10*3/uL (ref 150–400)
Platelets: 234 10*3/uL (ref 150–400)
RBC: 3.72 MIL/uL — ABNORMAL LOW (ref 3.87–5.11)
RBC: 3.96 MIL/uL (ref 3.87–5.11)
RBC: 4.05 MIL/uL (ref 3.87–5.11)
RBC: 4.14 MIL/uL (ref 3.87–5.11)
RDW: 17.8 % — ABNORMAL HIGH (ref 11.5–15.5)
RDW: 17.9 % — ABNORMAL HIGH (ref 11.5–15.5)
RDW: 18.3 % — ABNORMAL HIGH (ref 11.5–15.5)
RDW: 18.4 % — ABNORMAL HIGH (ref 11.5–15.5)
WBC: 3.5 10*3/uL — ABNORMAL LOW (ref 4.0–10.5)
WBC: 3.7 10*3/uL — ABNORMAL LOW (ref 4.0–10.5)
WBC: 5.6 10*3/uL (ref 4.0–10.5)
WBC: 9.8 10*3/uL (ref 4.0–10.5)

## 2011-03-18 LAB — GLUCOSE, CAPILLARY
Glucose-Capillary: 106 mg/dL — ABNORMAL HIGH (ref 70–99)
Glucose-Capillary: 107 mg/dL — ABNORMAL HIGH (ref 70–99)
Glucose-Capillary: 111 mg/dL — ABNORMAL HIGH (ref 70–99)
Glucose-Capillary: 123 mg/dL — ABNORMAL HIGH (ref 70–99)
Glucose-Capillary: 127 mg/dL — ABNORMAL HIGH (ref 70–99)
Glucose-Capillary: 149 mg/dL — ABNORMAL HIGH (ref 70–99)
Glucose-Capillary: 170 mg/dL — ABNORMAL HIGH (ref 70–99)
Glucose-Capillary: 48 mg/dL — ABNORMAL LOW (ref 70–99)
Glucose-Capillary: 49 mg/dL — ABNORMAL LOW (ref 70–99)
Glucose-Capillary: 55 mg/dL — ABNORMAL LOW (ref 70–99)
Glucose-Capillary: 62 mg/dL — ABNORMAL LOW (ref 70–99)
Glucose-Capillary: 67 mg/dL — ABNORMAL LOW (ref 70–99)
Glucose-Capillary: 77 mg/dL (ref 70–99)
Glucose-Capillary: 77 mg/dL (ref 70–99)
Glucose-Capillary: 79 mg/dL (ref 70–99)
Glucose-Capillary: 83 mg/dL (ref 70–99)
Glucose-Capillary: 86 mg/dL (ref 70–99)
Glucose-Capillary: 86 mg/dL (ref 70–99)
Glucose-Capillary: 86 mg/dL (ref 70–99)

## 2011-03-18 LAB — POCT I-STAT, CHEM 8
BUN: 12 mg/dL (ref 6–23)
Calcium, Ion: 1.12 mmol/L (ref 1.12–1.32)
Chloride: 102 mEq/L (ref 96–112)
Creatinine, Ser: 0.5 mg/dL (ref 0.4–1.2)
Glucose, Bld: 140 mg/dL — ABNORMAL HIGH (ref 70–99)
HCT: 32 % — ABNORMAL LOW (ref 36.0–46.0)
Hemoglobin: 10.9 g/dL — ABNORMAL LOW (ref 12.0–15.0)
Potassium: 3.7 mEq/L (ref 3.5–5.1)
Sodium: 135 mEq/L (ref 135–145)
TCO2: 21 mmol/L (ref 0–100)

## 2011-03-18 LAB — LIPID PANEL
Cholesterol: 162 mg/dL (ref 0–200)
HDL: 56 mg/dL (ref >39)
LDL Cholesterol: 85 mg/dL (ref 0–99)
Total CHOL/HDL Ratio: 2.9 RATIO
Triglycerides: 103 mg/dL (ref <150)
VLDL: 21 mg/dL (ref 0–40)

## 2011-03-18 LAB — IRON AND TIBC
Iron: 10 ug/dL — ABNORMAL LOW (ref 42–135)
Saturation Ratios: 3 % — ABNORMAL LOW (ref 20–55)
TIBC: 359 ug/dL (ref 250–470)
UIBC: 349 ug/dL

## 2011-03-18 LAB — TSH: TSH: 0.857 u[IU]/mL (ref 0.350–4.500)

## 2011-03-18 LAB — COMPREHENSIVE METABOLIC PANEL
ALT: 32 U/L (ref 0–35)
AST: 47 U/L — ABNORMAL HIGH (ref 0–37)
Albumin: 3.2 g/dL — ABNORMAL LOW (ref 3.5–5.2)
Alkaline Phosphatase: 39 U/L (ref 39–117)
BUN: 11 mg/dL (ref 6–23)
CO2: 23 mEq/L (ref 19–32)
Calcium: 8.4 mg/dL (ref 8.4–10.5)
Chloride: 104 mEq/L (ref 96–112)
Creatinine, Ser: 0.71 mg/dL (ref 0.4–1.2)
GFR calc Af Amer: >60 mL/min (ref >60)
GFR calc non Af Amer: >60 mL/min (ref >60)
Glucose, Bld: 140 mg/dL — ABNORMAL HIGH (ref 70–99)
Potassium: 3.7 mEq/L (ref 3.5–5.1)
Sodium: 133 mEq/L — ABNORMAL LOW (ref 135–145)
Total Bilirubin: 1.1 mg/dL (ref 0.3–1.2)
Total Protein: 6 g/dL (ref 6.0–8.3)

## 2011-03-18 LAB — DIFFERENTIAL
Basophils Absolute: 0 10*3/uL (ref 0.0–0.1)
Basophils Relative: 0 % (ref 0–1)
Eosinophils Absolute: 0 10*3/uL (ref 0.0–0.7)
Eosinophils Relative: 0 % (ref 0–5)
Lymphocytes Relative: 1 % — ABNORMAL LOW (ref 12–46)
Lymphs Abs: 0.1 10*3/uL — ABNORMAL LOW (ref 0.7–4.0)
Monocytes Absolute: 0 10*3/uL — ABNORMAL LOW (ref 0.1–1.0)
Monocytes Relative: 0 % — ABNORMAL LOW (ref 3–12)
Neutro Abs: 9.7 10*3/uL — ABNORMAL HIGH (ref 1.7–7.7)
Neutrophils Relative %: 99 % — ABNORMAL HIGH (ref 43–77)

## 2011-03-18 LAB — URINALYSIS, ROUTINE W REFLEX MICROSCOPIC
Bilirubin Urine: NEGATIVE
Glucose, UA: NEGATIVE mg/dL
Hgb urine dipstick: NEGATIVE
Ketones, ur: NEGATIVE mg/dL
Leukocytes, UA: NEGATIVE
Nitrite: NEGATIVE
Protein, ur: 30 mg/dL — AB
Specific Gravity, Urine: 1.025 (ref 1.005–1.030)
Urobilinogen, UA: 0.2 mg/dL (ref 0.0–1.0)
pH: 6 (ref 5.0–8.0)

## 2011-03-18 LAB — CULTURE, BLOOD (ROUTINE X 2)
Culture: NO GROWTH
Culture: NO GROWTH

## 2011-03-18 LAB — HIV ANTIBODY (ROUTINE TESTING W REFLEX): HIV: NONREACTIVE

## 2011-03-18 LAB — HEMOCCULT GUIAC POC 1CARD (OFFICE): Fecal Occult Bld: NEGATIVE

## 2011-03-18 LAB — DRUGS OF ABUSE SCREEN W/O ALC, ROUTINE URINE
Amphetamine Screen, Ur: NEGATIVE
Barbiturate Quant, Ur: NEGATIVE
Benzodiazepines.: NEGATIVE
Cocaine Metabolites: NEGATIVE
Creatinine,U: 150.4 mg/dL
Marijuana Metabolite: NEGATIVE
Methadone: NEGATIVE
Opiate Screen, Urine: POSITIVE — AB
Phencyclidine (PCP): NEGATIVE
Propoxyphene: POSITIVE — AB

## 2011-03-18 LAB — PROPOXYPHENE, CONFIRMATION
Propoxyphene Metabolite: 2600 ng/mL
Propoxyphene or Metab. GC/MS: NEGATIVE

## 2011-03-18 LAB — METHYLMALONIC ACID, SERUM: Methylmalonic Acid, Quantitative: 181 nmol/L (ref 87–318)

## 2011-03-18 LAB — URINE CULTURE
Colony Count: NO GROWTH
Culture: NO GROWTH
Special Requests: NEGATIVE

## 2011-03-18 LAB — PROTIME-INR
INR: 1.2 (ref 0.00–1.49)
Prothrombin Time: 15.1 seconds (ref 11.6–15.2)

## 2011-03-18 LAB — CORTISOL: Cortisol, Plasma: 13.2 ug/dL

## 2011-03-18 LAB — RETICULOCYTES
RBC.: 4.02 MIL/uL (ref 3.87–5.11)
Retic Count, Absolute: 52.3 10*3/uL (ref 19.0–186.0)
Retic Ct Pct: 1.3 % (ref 0.4–3.1)

## 2011-03-18 LAB — VITAMIN B12: Vitamin B-12: 273 pg/mL (ref 211–911)

## 2011-03-18 LAB — PATHOLOGIST SMEAR REVIEW

## 2011-03-18 LAB — D-DIMER, QUANTITATIVE: D-Dimer, Quant: 1.4 ug/mL-FEU — ABNORMAL HIGH (ref 0.00–0.48)

## 2011-03-18 LAB — CRYPTOCOCCAL ANTIGEN: Crypto Ag: NEGATIVE

## 2011-03-18 LAB — SEDIMENTATION RATE: Sed Rate: 19 mm/hr (ref 0–22)

## 2011-03-18 LAB — APTT: aPTT: 31 seconds (ref 24–37)

## 2011-03-18 LAB — HEPATITIS PANEL, ACUTE
HCV Ab: NEGATIVE
Hep A IgM: NEGATIVE
Hep B C IgM: NEGATIVE
Hepatitis B Surface Ag: NEGATIVE

## 2011-03-18 LAB — ANA: Anti Nuclear Antibody(ANA): NEGATIVE

## 2011-03-18 LAB — FERRITIN: Ferritin: 56 ng/mL (ref 10–291)

## 2011-03-18 LAB — RPR: RPR Ser Ql: NONREACTIVE

## 2011-03-18 LAB — FOLATE: Folate: 11.7 ng/mL

## 2011-03-18 LAB — TECHNOLOGIST SMEAR REVIEW: Path Review: INCREASED

## 2011-03-18 LAB — LIPASE, BLOOD: Lipase: 16 U/L (ref 11–59)

## 2011-03-20 LAB — POCT I-STAT GLUCOSE
Glucose, Bld: 54 mg/dL — ABNORMAL LOW (ref 70–99)
Operator id: 221371

## 2011-04-24 NOTE — Procedures (Signed)
[name], [name]               ACCOUNT NO.:  [id number]   MEDICAL RECORD NO.:  [id number]          PATIENT TYPE:  OUT   LOCATION:  SLEEP CENTER                 FACILITY:  [facility]   PHYSICIAN:  Barbaraann Share, MD,FCCPDATE OF BIRTH:  1967-11-[birth year]   DATE OF STUDY:  06/09/2008                            NOCTURNAL POLYSOMNOGRAM   REFERRING PHYSICIAN:   LOCATION:  Sleep lab.   REFERRING PHYSICIAN:  Melvyn Novas, M.D.   INDICATION FOR STUDY:  Hypersomnia with sleep apnea.   EPWORTH SLEEPINESS SCORE:  6.   SLEEP ARCHITECTURE:  The patient had a total sleep time of 286 minutes  with decreased quantity of REM and no slow wave sleep noted.  Sleep  onset latency was normal at 24 minutes, and REM onset was very prolonged  at 267 minutes.  Sleep efficiency was decreased at 78%.   RESPIRATORY DATA:  Patient was found to have 30 hypopneas and no apneas  for an apnea-hypopnea index of 6 events per hour.  She was also noted to  have 19 respiratory-effort-related arousals, giving her a Respiratory  Disturbance Index of 10 events per hour.  The events were not  positional, and there was moderate-to-loud snoring noted throughout.   OXYGEN DATA:  There was O2 desaturation as low as 82% with the patient's  obstructive events.   CARDIAC DATA:  No clinically significant arrhythmias were noted.   MOVEMENT-PARASOMNIA:  The patient was found to have 48 periodic limb  movements with 2 per hour, resulting in arousal or awakening.  There  were no abnormal behaviors noted.   IMPRESSIONS-RECOMMENDATIONS:  1. Mild obstructive sleep apnea/hypopnea syndrome with an apnea-      hypopnea index of 6 events per hour and an Respiratory Disturbance      Index of 10 events per hour.  There was oxygen desaturation as low      as 88%.  Treatment for this degree of sleep apnea can include      weight loss alone, if applicable, upper airway surgery,      oral appliance, and also CPAP.  2. Small numbers of  periodic limb movements with minimal sleep      disruption.      Barbaraann Share, MD,FCCP  Diplomate, American Board of Sleep  Medicine  Electronically Signed     [facility]/MEDQ  D:  06/23/2008 21:18:35  T:  06/23/2008 21:54:25  Job:  [id number]

## 2011-04-24 NOTE — Discharge Summary (Signed)
NAMESHLEY, DOLBY               ACCOUNT NO.:  [id number]   MEDICAL RECORD NO.:  [id number]          PATIENT TYPE:  INP   LOCATION:  3741                         FACILITY:  [facility]   PHYSICIAN:  Alvester Morin, M.D.  DATE OF BIRTH:  Apr 20, 1966   DATE OF ADMISSION:  07/01/2009  DATE OF DISCHARGE:  07/04/2009                               DISCHARGE SUMMARY   DISCHARGE DIAGNOSES:  1. Hypotension/fever/tachycardia, unclear etiology, possibly RMSF.  2. Diabetes mellitus.  3. Anemia.  4. Hyperlipidemia.  5. Hypertension.  6. Fibromyalgia.   DISCHARGE MEDICATIONS:  1. Doxycycline 100 mg by mouth twice daily for 7 days.  2. Valtrex 1000 mg by mouth twice daily for 7 days.  3. Aspirin 81 mg by mouth daily.  4. Toprol-XL 50 mg by mouth twice daily.  5. Actos 45 mg by mouth daily.  6. Metformin 1000 mg by mouth twice daily.  7. Victoza 1.8 mcg injected daily.  8. WelChol 6 tablets by mouth daily.  9. Lovaza 4 tablets by mouth daily.  10.Crestor 70 mg by mouth daily.  11.Prozac 40 mg by mouth at bedtime.  12.Trizir ? 145 mg by mouth daily.  13.Vicodin 5/325 every 6 hours as needed for pain.  14.Phenergan 12.5 mg as needed for nausea.  15.Insulin pump.  16.Beutyl 10 mg by mouth twice daily.  17.L-Lysine 500 mg by mouth daily.  18.Symbicort 2 puffs twice daily.  19.Xopenex as needed.  20.Levacet 2 tablets twice daily.  21.Multivitamin one tablet daily.   DISPOSITION AND FOLLOWUP:  The patient was discharged to home in stable  condition.  She is to follow up with Dr. Nedra Hai, her primary care Sudie Bandel  on August 3 at 11:00 a.m.Marland Kitchen  At this time, a basic metabolic panel should  be checked as the patient had mild hypokalemia during hospitalization.  Her tachycardia should be evaluated, and it should be determined whether  her Lasix and Prinivil should be restarted at that time.   No procedures were performed on this patient.   No consults were done on this patient.   ADMISSION HISTORY  AND PHYSICAL:  Ms. Alford is a 45 year old female  with a past medical history of diabetes mellitus complicated by a  history of nephrotic syndrome, history of chest pain with a negative  cath, hypertension, hyperlipidemia and two prior hospitalizations  secondary to viral illnesses who presented to the ED after going to  work this morning.  She is a Engineer, civil (consulting) in [place] and noted she was in  her usual state of health until several hours ago when she arrived to  the floor and started feeling like she was going to faint.  At that time  she took her blood sugar and it was in the 150s, she subsequently took  her blood pressure and heart rate and noted that her heart rate was  noted to be in the 150s, so she was brought to the emergency department.  She does note some nausea when she started feeling faint and was febrile  in the ED to 101.7, but has not had any fevers prior.  She denies cough,  shortness of breath, chest pain, abdominal pain, headache, rashes,  dysuria, increased urinary frequency, sick contacts, or other problems.  She was diagnosed with sinusitis about 4 days ago and was started on  doxycycline at that time but feels that her sinusitis symptoms have  subsequently resolved 2 days ago.   ADMISSION MEDICATIONS:  1. Doxycycline 100 mg b.i.d. started 4 days ago.  2. Aspirin 81 mg by mouth daily.  3. Toprol-XL 50 mg b.i.d.  4. Prinivil 40 mg b.i.d.  5. Lasix 40 mg daily.  6. Actos 45 mg daily.  7. Metformin 1000 mg b.i.d.  8. Victoza 1.8 mcg subcutaneous daily.  9. Insulin pump - Humulin U 500.  10.WelChol 6 tablets daily.  11.Lovaza 4 tablets by mouth daily.  12.Trizir? 145 mg by mouth daily.  13.Crestor 20 mg by mouth daily.  14.Prozac 40 mg by mouth at bedtime.  15.Vicodin as needed for pain.  16.Phenergan as needed for nausea.  17.Beutyl 10 mg by mouth b.i.d.  18.Levsin 2 tablets by mouth twice daily.  19.A multivitamin once daily.  20.L-Lysine 500 mg by mouth  daily.  21.Symbicort 2 puffs twice daily.  22.Xopenex as needed.   REVIEW OF SYSTEMS:  As per HPI.   PHYSICAL EXAMINATION:  ADMISSION VITALS:  Temperature 101.0, blood  pressure 141/66, pulse 138, respiration rate 20, oxygen saturation 90%  on room air.  GENERAL:  She is alert and oriented x3, obese, mildly diaphoretic,  pleasant, and in no acute distress.  EYES:  PERRLA, anicteric.  ENT:  MMM.  NECK:  No lymphadenopathy, no nuchal rigidity.  LUNGS:  Normal effort.  Clear to auscultation bilaterally.  HEART:  Regular rate and rhythm, distant, no rubs, murmurs or gallop.  GI:  Obese, soft, nontender, nondistended, positive bowel sounds.  EXTREMITIES:  No edema, no calf tenderness.  SKIN:  Mildly diaphoretic, no rashes or abscesses noted anywhere.  LYMPH:  No axillary, supraclavicular or cervical lymphadenopathy.  No  inguinal lymphadenopathy as well.  NEUROLOGIC:  Cranial nerves II through XII intact, strength intact.  PSYCHIATRIC:  Appropriate.   LABORATORY DATA:  Complete metabolic panel:  Sodium 133, potassium 3.7,  chloride 104, bicarb 23, BUN 11, creatinine 0.71, glucose 140, bilirubin  1.1, alk phos 39, AST 47, ALT 32, protein 6.0, albumin 3.2, calcium 8.4.  Complete blood count:  White blood cells 9.8, hemoglobin 9.9, platelets  234, ANC 9.7, MCV 75.  Urine pregnancy negative.  Urinalysis showed 30  micrograms of protein.  A chest x-ray showed pulmonary vascular  prominence and no acute pulmonary disease.   HOSPITAL COURSE:  1. Hypotension/fever/tachycardia.  The etiology of her presenting      symptoms is still unclear at discharge.  Given her symptoms, sepsis      was high on the differential, however, no source of infection could      be found as the urinalysis showed no signs of infection, and her      chest x-ray showed no signs of pneumonia, and her skin did not have      any abscesses or open wounds visible.  She was initially      hypertensive when she arrived at  the emergency department at 140      systolic, and then progressively became hypotensive to 80/60      following 3 liters of fluid, however, after she was hydrated and      was admitted to the intensive care unit, her blood pressures  remained normal with systolics above 100.  The patient has known      hepatic cysts/adenomas, and it was thought that perhaps one of      these had burst causing an infection.  However, this was highly      unlikely given the lack of abdominal pain and the fact that her      abdomen was completely nontender to palpation.  Pulmonary embolism      was also considered to be an etiology of this patient's      presentation.  A D-dimer was obtained and was positive at 1.4.  A      CT angiogram was performed and did not demonstrate a large      pulmonary embolism.  However, the distal branches were unable to be      evaluated properly given motion artifacts.  She later developed a      headache and given her related symptoms and an unclear source of      infection, [facility] spotted fever was considered as a      diagnosis.  She was already on doxycycline for her sinusitis 4 days      prior to admission, and this was continued throughout      hospitalization.  The patient was discharged on doxycycline for      another 7 days to complete a 2-week course.   The patient remained tachycardiac throughout hospitalization.  She was  initially in the 150s and towards the end of hospitalization was in the  low 100s.  It is unclear why she continued to be tachycardiac given the  fact that she was hydrated well and remained afebrile following  admission to the intensive care unit.  She was continued on her Toprol  and her tachycardia was thought to be associated perhaps with [facility] spotted fever.  She will have close followup next week with her  primary care Levorn Oleski, at which time, an evaluation of her tachycardia  is appropriate.  1. Anemia.  The patient  has a history of menorrhagia.  Her MCV was 75      suggesting iron deficiency anemia.  An anemia panel further      supported this with normal B12 and normal folate levels as well as      normal total iron binding capacity and normal ferritin.  Her iron      and her percent saturation were low consistent with iron deficiency      anemia.  A pathology smear suggested that we consider beta      thalassemia, however, iron deficiency anemia due to menorrhagia is      most likely.  [name] hemoglobin remained stable in the 9-10      range throughout hospitalization, and no further workup was done.  2. Diabetes mellitus.  The patient was continued on her insulin pump      per her outpatient regimen.  She did have a few episodes of      hypoglycemia which may be related to this infection.  Overall,      however, her blood sugar remained in good control throughout      hospitalization.  3. Hypertension.  Her hypertensive medications were all held given her      initial presentation of hypotension.  On discharge, her Toprol was      continued given her tachycardia, however, her Lasix and Prinivil      were held.  It is suggested  to Dr. Nedra Hai that on followup, he      evaluate whether or not she should be restarted on these      medications.  4. Hyperlipidemia.  The patient was continued on her home regimen of      Crestor.  5. Fibromyalgia.  Vicodin was continued during this hospitalization to      control her pain.  Her pain was well controlled throughout her      hospitalization.  6. Mouth lesions (herpetic).  On the day of discharge the patient      complained of painful mouth lesions that she said were consistent      with her last outbreak of herpes.  She is allergic to ACYCLOVIR and      said that Valtrex worked well for her last time.  She was      discharged on 1000 mg of Valtrex b.i.d. for 7 days.   DISCHARGE LABORATORY DATA AND VITAL:  Discharged Vitals:  Temperature  97.7, blood  pressure 121/73, pulse 99, respirations 18, oxygen  saturation 94% on room air.  Discharge labs:  Sodium 139, potassium 3.4, chloride 108, bicarb 25, BUN  6, creatinine 0.61, glucose 94, calcium 8.5.  CBC:  White blood cells  3.5, hemoglobin 9.7, platelet count 185.      Silvestre Gunner, MD  Electronically Signed      Alvester Morin, M.D.  Electronically Signed    AR/MEDQ  D:  07/04/2009  T:  07/05/2009  Job:  [id number]   cc:   Simone Curia

## 2011-04-24 NOTE — Cardiovascular Report (Signed)
[name], [name]               ACCOUNT NO.:  [id number]   MEDICAL RECORD NO.:  [id number]          PATIENT TYPE:  OIB   LOCATION:  1963                         FACILITY:  [facility]   PHYSICIAN:  Arturo Morton. Riley Kill, MD, FACCDATE OF BIRTH:  1966/10/25   DATE OF PROCEDURE:  04/20/2009  DATE OF DISCHARGE:                            CARDIAC CATHETERIZATION   INDICATIONS:  [name] is a 45 year old female referred by Dr.  Darrick Penna for evaluation of chest pain.  She presents with a fairly  extensive workup, was continued to have some discomfort.  She is  familiar with medical things.  She was scheduled to have a CT angio of  the coronaries, but cancelled the study at [facility] where the  echocardiogram was unremarkable.  The current study was done to assess  coronary anatomy after risks, benefits, and alternatives were discussed  with the patient in detail.   PROCEDURES:  1. Left heart catheterization.  2. Selective coronary arteriography.  3. Selective left ventriculography.   DESCRIPTION OF PROCEDURE:  The patient was brought to the  Catheterization Laboratory and prepped and draped in the usual fashion.  Through an anterior puncture, the femoral artery was easily entered and  a 4-French sheath was then placed.  Following this, views of the left  and right coronaries were obtained in multiple angiographic projections.  Central aortic and left ventricular pressures were measured with  pigtail.  Ventriculography was then performed in the RAO projection.  There were no major complications.  The patient tolerated the procedure  well and was taken to the holding area in satisfactory clinical  condition.   HEMODYNAMIC DATA:  1. The central aortic pressure was 123/79, mean 100.  2. Left ventricular pressure 118/15 with an LVEDP of 27.   ANGIOGRAPHIC DATA:  1. Ventriculography was done in the RAO projection.  Overall systolic      function was vigorous.  No definite wall  motion abnormalities were      visualized.  There was a tiny inferior diverticulum.  2. The right coronary artery is without significant calcification.  It      was a moderate-sized vessel with a right ventricular branch, a      posterior descending, and a fairly small posterolateral system.  It      supplies approximately one-half of the inferior wall.  The RCA      appears to be smooth throughout without significant focal      narrowing.  3. The left main is free of critical disease.  4. The left anterior descending artery courses to the apex.  There is      one major diagonal and one smaller diagonal branch.  There is a      proximal septal perforator.  The LAD and its branches appear to be      free of significant disease.  5. There is a small ramus intermedius with minimal irregularity at the      proximal mid junction, but no significant focal stenosis.  6. The circumflex consists predominantly of a moderate-sized first      marginal and  a very large second marginal branch that goes out to      the lateral wall and bifurcates.  The circumflex and its branches      appear to be free of significant disease.   CONCLUSIONS:  1. Preserved overall left ventricular systolic function with minimally      elevated left ventricular end-diastolic pressure.  2. No significant areas of high-grade focal coronary obstruction.   DISPOSITION:  The etiology of the patient's chest pain remains  uncertain.  However, her symptoms are improved.  We will see her back in  the office and review her findings.      Arturo Morton. Riley Kill, MD, [facility]  Electronically Signed     TDS/MEDQ  D:  04/20/2009  T:  04/21/2009  Job:  [id number]   cc:   Fayrene Fearing L. Deterding, M.D.

## 2011-04-27 NOTE — Op Note (Signed)
[name], [name]                 ACCOUNT NO.:  [id number]   MEDICAL RECORD NO.:  [id number]                   PATIENT TYPE:  OIB   LOCATION:  2875                                 FACILITY:  [facility]   PHYSICIAN:  Sharlet Salina T. Hoxworth, M.D.          DATE OF BIRTH:  Oct 03, 1966   DATE OF PROCEDURE:  05/26/2004  DATE OF DISCHARGE:                                 OPERATIVE REPORT   PREOPERATIVE DIAGNOSIS:  Biliary dyskinesia.   POSTOPERATIVE DIAGNOSIS:  Biliary dyskinesia.   OPERATION PERFORMED:  Laparoscopic cholecystectomy.   SURGEON:  Lorne Skeens. Hoxworth, M.D.   ASSISTANT:  Gabrielle Dare. Janee Morn, M.D.   ANESTHESIA:  General.   INDICATIONS FOR PROCEDURE:  [name] is a 45 year old female with  multiple medical problems and known stable multiple hepatic adenomas who  presents with persistent right upper quadrant abdominal pain and nausea.  This has been a new symptom for her, has been significant and persistent  over a number of weeks.  She has had an extensive work-up including negative  gallbladder ultrasound, HIDA scan showing decreased ejection fraction of the  gallbladder and MRI of the liver showing stable hepatic adenomas with no  evidence of bleed or other complications.  After discussion of treatment  options extensively in the office, we have elected to proceed with  laparoscopic cholecystectomy in an effort to relieve her pain with a  presumed diagnosis of biliary dyskinesia.  The nature of the procedure, its  indications and risks of bleeding and infection and increased risk of  bleeding with her hepatic adenomas as well as increased risk for her  comorbidities of hypertension and diabetes were discussed and understood.  We elected to proceed with this procedure at this time.   DESCRIPTION OF PROCEDURE:  The patient was brought to the operating room and  placed in supine position on the operating table and general endotracheal  anesthesia was induced.   She received preoperative antibiotics.  The abdomen  was widely sterilely prepped and draped.  Local anesthesia was used to  infiltrate the trocar sites prior to the incision.  A 1 cm incision was made  above the umbilicus and dissection carried down to the midline fascia above  the umbilicus which was sharply incised for 1 cm and the peritoneum entered  under direct vision.  Through a mattress suture of 2-0 Vicryl, the Hasson  trocar was placed and the pneumoperitoneum was established.  Under direct  vision, a 10 mm trocar was placed in the subxiphoid area and two 5 mm  trocars in the right subcostal margin.  The gallbladder was visualized.  It  was not acutely inflamed but did have some omental adhesions.  The liver  showed extensive fatty infiltration but there was no evidence of any masses  on external exam of the liver.  The fundus of the gallbladder was grasped  and omental adhesions taken down with cautery.  The infundibulum was exposed  and retracted inferolaterally.  Fibrofatty  tissue was stripped off the neck  of the gallbladder toward the porta hepatis.  The distal gallbladder and  Calot's triangle was thoroughly dissected.  The cystic duct was identified  coursing up onto the gallbladder and the cystic duct gallbladder junction,  completely dissected.  The cystic duct was quite small.  It was clipped at  the gallbladder junction.  It was opened and there was a tiny amount of bile  but the lumen was very small and would not accept the [facility] catheter and a  cholangiogram was attempted with the catheter being significantly bigger  then the lumen of the cystic duct.  At this point the cystic duct was triply  clipped proximally and divided.  The cystic artery had been identified  clearly coursing up onto the gallbladder and was divided between two  proximal and one distal clip.  The gallbladder was dissected free from its  bed.  The posterior branch of the cystic artery was controlled  with clips.  The gallbladder was removed intact from the gallbladder bed.  Complete  hemostasis was obtained with cautery and a small Surgicel pack.  The  gallbladder was withdrawn from the midline incision.  The abdomen was  irrigated and suction hemostasis assured.  The mattress suture was secured  at the midline incision.  Trocars removed under direct vision.  All CO2  evacuated.  Skin incisions were closed with interrupted subcuticular 4-0  Monocryl and Steri-Strips.  Sponge and needle counts were correct.  Dry  sterile dressings were applied.  The patient was then transferred to the  recovery room in good condition.                                               Lorne Skeens. Hoxworth, M.D.    Tory Emerald  D:  05/26/2004  T:  05/28/2004  Job:  [id number]

## 2011-04-27 NOTE — Discharge Summary (Signed)
[name], [name]       ACCOUNT NO.:  [id number]   MEDICAL RECORD NO.:  [id number]          PATIENT TYPE:  INP   LOCATION:  5013                         FACILITY:  [facility]   PHYSICIAN:  James L. Deterding, M.D.DATE OF BIRTH:  [birth year]   DATE OF ADMISSION:  03/08/2006  DATE OF DISCHARGE:  03/10/2006                                 DISCHARGE SUMMARY   ADMITTING DIAGNOSES:  1.  Suspected urosepsis.  2.  Insulin-dependent diabetes mellitus.  3.  Hypertension.  4.  Proteinuria with nephrotic syndrome,  5.  Obesity.  6.  Hyperlipidemia.  7.  Plantar fasciitis.  8.  Fibromyalgia.  9.  Status post recent kidney stone extraction from right ureter.   DISCHARGE DIAGNOSES:  1.  Fever, nausea, vomiting and diarrhea suspect secondary to acute viral      gastroenteritis, workup negative for sepsis.  2.  Status post recent right ureteral stone extraction with stenting. No      evidence of recurrent stone obstruction or infection. Cultures negative.  3.  Proteinuria with nephrotic syndrome.  4.  Insulin-dependent diabetes mellitus.  5.  Hypertension.  6.  Hyperlipidemia.  7.  Fibromyalgia.  8.  Plantar fasciitis.  9.  Obesity   BRIEF HISTORY:  A 45 year old white female with diabetic nephropathy and  severe nephrotic syndrome along with severe hypertension, obesity,  fibromyalgia, hyperlipidemia is now admitted with severe flank pain, nausea,  vomiting and fever.  Approximately 1 week ago, the patient had right flank  pain and was found to have a right ureteral obstructing stone.  She was  given oral and IV contrast at the time.  She underwent stone extraction with  stenting and feels that she has passed her stent. She felt well post  procedure until 2 days prior to admission when she started having flank  pain, fevers, sore throat, sores in her mouth, nausea and vomiting.  Her  primary physician put her on doxycycline.  Her symptoms continued to worsen.  Her temperature was  102 at home and 101 on admission.  She denies skin  rashes, itching, ear aches, URI.  She does have myalgias, arthralgias, and a  sore throat.   LABS ON ADMISSION:  Sodium 132, potassium 3.9, chloride 99, CO2 20, glucose  218, BUN 10, creatinine 0.9.  AST 43, ALT 36, total bilirubin 0.8, alk phos  43, calcium 8.0, white count 8700 with 87% segs, 6% lymphocytes, 4%  monocytes, hemoglobin 12.0, platelet 233,000, phosphorus 2.6.  Urinalysis is  yellow, clear, negative nitrite, negative leukocyte esterase.  0-2 white  blood cells and no red blood cells per high-power field.   HOSPITAL COURSE:  The patient was admitted, placed on her usual medications,  given gentle IV hydration and started on vancomycin and Fortaz empirically  after urine and blood cultures were obtained.  CT of the abdomen and pelvis  showed no evidence of urinary tract calculi or hydronephrosis, no acute  abnormal findings seen other than diffuse fatty infiltration of the liver  with multiple lesions as seen on prior MRI evaluation (she has a history of  hepatic adenomas related to estrogen), there was no perinephric  stranding.  Fevers defervesced and her maximum temperature recorded here in the hospital  was 99.3.  Blood cultures remained negative.  Urine culture revealed 5000  colonies of insignificant growth.  Nausea, vomiting, diarrhea, resolved  along with her flank pain.  IV antibiotics were stopped after 48 hours.  The  patient improved with IV hydration and symptomatic relief of her nausea and  vomiting.  On day of discharge, she was improved to baseline.  She was  afebrile.  She was instructed to continue her usual home medicines except to  hold Lasix and Prinivil for 2 days and then resume on April 3 if she was  feeling at her baseline.  She was instructed to stop her doxycycline.  She  was given Phenergan 25 mg 1 every 6 hours p.r.n. nausea prescription. On the  day of discharge, the patient's white count was  5400, hemoglobin 11.0,  platelets 231,000.  Sodium 139, potassium 3.1, chloride 105, CO2 24, glucose  85, BUN 6, creatinine 0.7, calcium 8.1.  She was told to call Dr.  Deterding's office for a follow-up appointment.   DISCHARGE MEDICATIONS:  1.  Glucophage 1000 mg b.i.d.  2.  Crestor 20 mg every evening.  3.  Lasix 80 mg daily to be held x2 more days.  4.  Prinivil 20 mg daily to be held 2 more days and then resume.  5.  Aspirin 81 mg daily.  6.  Prozac 40 mg daily.  7.  Claritin 10 mg q.h.s.  8.  Xanax 0.5 mg q.h.s.  9.  Insulin pump use as instructed.  10. Multivitamin 1 daily.  11. Foltx 1 daily.      [name], P.A.    ______________________________  Llana Aliment. Deterding, M.D.    RRK/MEDQ  D:  03/10/2006  T:  03/11/2006  Job:  [id number]   cc:   Simone Curia  Fax: [phone]   W. Danella Maiers, M.D.  Fax: [phone]   Veverly Fells. Altheimer, M.D.  Fax: [phone]   Cliffton Asters, M.D.  Fax: [phone]

## 2011-04-27 NOTE — H&P (Signed)
[name], [name]       ACCOUNT NO.:  [id number]   MEDICAL RECORD NO.:  [id number]          PATIENT TYPE:  OUT   LOCATION:  MRI                          FACILITY:  [facility]   PHYSICIAN:  James L. Deterding, M.D.DATE OF BIRTH:  Aug 20, 1966   DATE OF ADMISSION:  09/27/2005  DATE OF DISCHARGE:  09/27/2005                                HISTORY & PHYSICAL   ADMISSION DIAGNOSIS:  Sepsis.   HISTORY OF PRESENT ILLNESS:  This is a 45 year old female who we followed  along at [facility] for diabetic nephropathy with severe  nephrotic syndrome.  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  At this  time, she is admitted for severe flank pain.  Approximately one week ago she  had severe flank pain and was found to have an obstructing stone in her  right ureter.  She was given contrast oral and IV at that time, and the  contrast has not gone through that kidney, and had not gone through the  bowel.  On Monday, she had a stent and a stone removal, apparently a  basketing by Dr. __________ in [place].  She felt good for two days, and  then on Wednesday, starting having flank pain, fevers, but also had a sore  throat and sores in her mouth.  She saw her primary care physician who put  her on doxycycline.  She has gotten worse instead of better, more flank  pain.  Still has a sore throat, but no cough.  She has had worsening flank  pain and abdominal pain and nausea.  She has not had any diarrhea.  She has  ongoing chills.  Had a temperature of 102 at home, and is 101 here.  She has  had no skin rash, itching.  She does ache in her muscles and joints.  She  has had no earaches, but she does have the sore throat.   MEDICATIONS AT HOME:  1.  Insulin pump with __________.  2.  Crestor 20 mg daily.  3.  Glucophage 1 g b.i.d.  4.  Lasix 160 mg b.i.d.  5.  Baby aspirin once daily.  6.  Prinivil 40 mg  b.i.d.  7.  Prozac 40 mg daily.  8.  Zyrtec 10 mg h.s.  9.  Darvocet-N 100 one b.i.d. p.r.n.  10. Flexeril 10 mg q.h.s. p.r.n.  11. Tylenol p.r.n.  12. Januvia 100 mg daily.  13. Hydrocodone p.r.n.  14. Percocet 5/325 mg p.r.n.  15. Magic mouthwash q.4h.  16. Doxycycline 100 mg daily.  17. Vitamin B complex.  18. One Source vitamin and Biotin vitamin once a day.   ALLERGIES:  1.  PENICILLIN.  2.  FLORABID.  3.  BIAXIN.  4.  ELAVIL.   PAST MEDICAL HISTORY:  As listed above.   PAST SURGICAL HISTORY:  1.  Cholecystectomy about a year ago.  2.  She had biopsy-proven hepatic adenoma.  She has had followup MRI both      here in [place] and at [place].  3.  Wisdom teeth pulled.  4.  EGD in the  past.   REVIEW OF SYSTEMS:  HEENT:  The sore throat, film in her mouth.  No  earaches.  Just feels bad.  NECK:  Soreness in the neck.  PULMONARY:  No  cough or sputum production.  No asthma or hay fever.  GASTROINTESTINAL:  Just nausea, no diarrhea.  She has the right flank and right abdominal pain.  NEUROLOGIC:  Negative.  MUSCULOSKELETAL:  Aches in her feet as usual for  plantar fasciitis.  Diffuse aches and pains with fever.  SKIN:  Negative.   SOCIAL HISTORY:  She works as a Charity fundraiser at [employer].  She is married.  Does  not have any children.   FAMILY HISTORY:  Father has renal disease from nephrosclerosis.  Father also  had severe cardiac disease.   OBJECTIVE:  GENERAL:  This is a grossly obese female who appears  significantly ill.  She has a plethoric malar rash.  VITAL SIGNS:  Temperature is 100.9, blood pressure is 126/86 supine, going  to 124/60 standing.  Heart rate goes from 120 to 140.  HEENT:  Whitish film in her mouth.  Ears unremarkable.  NECK:  Significant posterior and anterior cervical adenopathy that is  tender.  No thyromegaly.  LUNGS:  No wheezes, rhonchi, or rales.  Slightly decreased resonance to  percussion.  Normal expansion, normal breath sounds.  ABDOMEN:   Obese, positive bowel sounds, tender right upper quadrant.  Liver  is down 3 cm and irregular.  There is no active skin rash, but she does have  the malar changes on her face.  It seems this is a little bit of acne.  MUSCULOSKELETAL:  Tenderness in her feet.  Otherwise, no significant  changes.  NEUROLOGIC:  Cranial nerves II-XII intact.  Motor is 5/5 and symmetric.  Deep tendon reflexes are 2+/4+.  Toes are downgoing.   She cannot make urine despite a lot of fluid intake today.   ASSESSMENT:  1.  Sepsis, probably urine in origin.  Use intravenous antibiotics,      intravenous fluids.  Imaging of her right kidney.  Cannot rule out the      fact that she may have acute renal failure at this point from dye volume      depletion, as well as having an ACE inhibitor.  2.  Diabetes.  Tight control.  3.  Hypertension.  Not an issue at this point.  4.  Proteinuria.  We will get back.  That is a chronic issue.  5.  Obesity.  6.  __________.  7.  Hyperlipidemia.  8.  Plantar fascitis.   PLAN:  1.  Admit.  2.  Intravenous fluids.  3.  Intravenous antibiotics.  4.  Cultures.  5.  Image her kidney.           ______________________________  Llana Aliment Deterding, M.D.     JLD/MEDQ  D:  03/08/2006  T:  03/09/2006  Job:  [id number]

## 2011-04-27 NOTE — Discharge Summary (Signed)
[name], [name]       ACCOUNT NO.:  [id number]   MEDICAL RECORD NO.:  [id number]          PATIENT TYPE:  INP   LOCATION:  3732                         FACILITY:  [facility]   PHYSICIAN:  Maree Krabbe, M.D.DATE OF BIRTH:  1966-05-15   DATE OF ADMISSION:  07/13/2006  DATE OF DISCHARGE:  07/17/2006                                 DISCHARGE SUMMARY   DISCHARGE DIAGNOSES:  1. Volume depletion.  2. Viral syndrome.  3. Diabetic nephropathy.  4. Insulin-dependent diabetes mellitus.  5. Hypertension.  6. Hypokalemia.  7. Hypophosphatemia.   CONSULTATIONS:  Dr. Lenn Sink, Montez Hageman., with infectious disease   HISTORY OF PRESENT ILLNESS:  This is a 45 year old Caucasian female who  presented in the emergency department today complaining of nausea, vomiting  and some lightheadedness with standing over the past 24 hours.  The patient  is under great stress with the failing health of her father.  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  The patient has  diabetic nephropathy and is currently on high-dose Lasix therapy at home.  She is being admitted for further evaluation.  The patient denies any cough,  dysuria or diarrhea at this time.  Her creatinine in April2007 was 0.8.   ADMISSION LABORATORY DATA:  BNP less than 30.  WBC 12.3, hemoglobin 13.7,  hematocrit 39.8, platelet count 278.  Sodium 135, potassium 3.2, chloride  98, CO2 23, glucose 132, BUN 19, creatinine 1.3, total bilirubin 1.0,  alkaline phos 44, SGOT 66, SGPT 52, total protein 6.3, albumin 3.5, and  calcium 7.9.  UA had 250 of urine glucose, 15 ketones, small blood, 30 of  protein.  Micro revealed few squamous epithelials and some hyaline casts.   DIAGNOSTIC RADIOLOGICAL EXAMINATIONS:  August5,2007, two-view chest x-ray,  impression:  1. No pneumonia.  2. Scarring versus atelectasis left base.   HOSPITAL COURSE:  #1 - VOLUME DEPLETION.  The patient was admitted which was  felt  to be at that time secondary to volume depletion due to over-diuresis  at home and/or polyuria secondary to her diabetes.  The patient was  initially started on IV fluid for rehydration.  A urine culture was also  obtained which was negative for infection.  The patient seemed to tolerate  IV fluid hydration well; however, approximately 36 hours after her admission  she began to have new bilateral lower extremity edema and it was felt at  that time that the patient may be getting fluid overloaded.  Consequently,  the IV fluid therapy was stopped at that time and she was restarted on low-  dose diuretic therapy which she seemed to tolerate well without difficulty  and had gradual resolution of the pitting lower extremity edema   #2 - VIRAL SYNDROME.  The patient at the time of admission did have some  nausea and vomiting.  During her hospital stay she also became febrile and  complained of a sore throat with presence of oral blisters.  The patient  states she had previous episode in the fall of 2006 which at that time she  was diagnosed with a viral syndrome after  extensive workup.  The patient was  pancultured with symptom onset.  As stated above, the urine culture remained  negative.  Preliminary blood culture results were also negative.  A chest x-  ray was performed at that time as well with results as stated above.  The  patient was started on Rocephin therapy empirically and infectious disease  was consulted.  During the patient's hospital stay she had gradual  resolution in her fever.  Antibiotic therapy was discontinued.  Infectious  disease felt that the oral blisters were stress-induced aphthous ulcers and  recommended Mycelex troches for that.  No further intervention was done.  At  the time of discharge the patient's white blood cell count had also returned  to normal as well.   #3 - DIABETIC NEPHROPATHY.  During the patient's hospitalization her BUN and  serum creatinine levels  remained stable.  Please refer to #1 for further  information.   #4 - INSULIN-DEPENDENT DIABETES MELLITUS.  The patient continued using her  insulin pump throughout her hospitalization.  The patient did have some  episodes of hypoglycemia which was felt secondary to her decreased p.o.  intake due to mouth pain.  The patient simply adjusted her insulin pump  accordingly and the hypoglycemia episodes did resolve.  The patient  otherwise remained stable.   #5 - HYPERTENSION.  Upon admission the patient did have some hypotension and  tachycardia which was felt secondary to volume depletion.  Again, as stated  above, the patient received some rehydration with IV fluids and her diuretic  and beta blocker therapy were initially held.  [name] blood pressure  did improve as well as her heart rate and she was gradually restarted on her  beta blocker as well as low-dose diuretic therapy which the patient  tolerated well and without difficulty.  At the time of discharge the  patient's heart rate was in the low 100s and systolic blood pressure ranged  from 120-150.   #6 - HYPOKALEMIA.  [name] potassium level was decreased at the time  of admission and remained slightly low throughout her hospital stay.  The  patient received p.o. supplementation which she tolerated well and will be  discharged for continuous daily p.o. supplementation.  At the time of  discharge the patient's potassium was 3.3.   #7 - HYPOPHOSPHATEMIA.  [name] phosphorus level was also found to be  decreased as well during her hospital stay.  She was encouraged to increase  her dairy product intake the patient was agreeable.  She does like cheese.  The patient was not placed on any supplementation and this will be followed  up closely as an outpatient.   DISCHARGE MEDICATIONS:  1. Aspirin 81 mg daily.  2. Multivitamin one p.o. daily.  3. Nexium 40 mg one p.o. daily. 4. Vitamin B complex one p.o. daily.   5. Crestor 20 mg one p.o. daily.  6. Toprol-XL 25 mg p.o. daily.  7. Glucophage 1000 mg one p.o. b.i.d.  8. [place] 100 mg one p.o. daily.  9. Xanax 0.5 mg one p.o. q.h.s. p.r.n.  10.Prozac 40 mg one p.o. daily.  11.Flexeril 10 mg one p.o. q.h.s. p.r.n.  12.Darvocet one p.o. q.6h. p.r.n. pain.  13.Lasix 20 mg one p.o. daily.  14.K-Dur 40 mEq one p.o. daily.  15.Mycelex troches 10 mg one p.o. up to five times p.r.n.  16.The patient has been instructed to hold her Prinivil therapy at this      time.  DISCHARGE INSTRUCTIONS:  The patient will resume her diabetic diet.  She  will report to [facility] office Friday, August10,2007, at  11 a.m. for blood pressure, heart rate, weight check, as well as a CBC and  renal profile collection.  The patient then has one followup appointment on  Friday, August17,2007, at 8:30 a.m. with the nurse practitioner, and then  she also has one followup appointment on September4,2007, at 3:15 p.m. with  her primary nephrologist, Dr. Darrick Penna.  The patient has been instructed to  call [facility] sooner should she begin to have any  problems.  The patient will also be set up with an immunology clinic for  further evaluation of this viral syndrome.  The office will let her know  when and where the appointment has been scheduled.      Tracey P. Sherrod, NP      Maree Krabbe, M.D.  Electronically Signed    TPS/MEDQ  D:  07/19/2006  T:  07/19/2006  Job:  [id number]   cc:   Fayrene Fearing L. Deterding, M.D.  Rockey Situ. Flavia Shipper., M.D.

## 2011-08-08 ENCOUNTER — Other Ambulatory Visit (HOSPITAL_COMMUNITY): Payer: Self-pay | Admitting: Neurology

## 2011-08-08 DIAGNOSIS — IMO0002 Reserved for concepts with insufficient information to code with codable children: Secondary | ICD-10-CM

## 2011-08-10 ENCOUNTER — Ambulatory Visit (HOSPITAL_COMMUNITY)
Admission: RE | Admit: 2011-08-10 | Discharge: 2011-08-10 | Disposition: A | Discharge status: Home / Self Care | Payer: 59 | Source: Ambulatory Visit | Attending: Neurology | Admitting: Neurology

## 2011-08-10 DIAGNOSIS — M79609 Pain in unspecified limb: Secondary | ICD-10-CM | POA: Insufficient documentation

## 2011-08-10 DIAGNOSIS — M545 Low back pain, unspecified: Secondary | ICD-10-CM | POA: Insufficient documentation

## 2011-08-10 DIAGNOSIS — M5126 Other intervertebral disc displacement, lumbar region: Secondary | ICD-10-CM | POA: Insufficient documentation

## 2011-08-10 DIAGNOSIS — IMO0002 Reserved for concepts with insufficient information to code with codable children: Secondary | ICD-10-CM | POA: Insufficient documentation

## 2011-08-11 HISTORY — PX: LUMBAR DISC SURGERY: SHX700

## 2011-08-22 ENCOUNTER — Ambulatory Visit (HOSPITAL_COMMUNITY): Payer: 59

## 2011-08-22 ENCOUNTER — Ambulatory Visit (HOSPITAL_COMMUNITY)
Admission: RE | Admit: 2011-08-22 | Discharge: 2011-08-23 | Disposition: A | Discharge status: Home / Self Care | Payer: 59 | Source: Ambulatory Visit | Attending: Neurosurgery | Admitting: Neurosurgery

## 2011-08-22 ENCOUNTER — Inpatient Hospital Stay (HOSPITAL_COMMUNITY): Payer: 59

## 2011-08-22 DIAGNOSIS — Z01818 Encounter for other preprocedural examination: Secondary | ICD-10-CM | POA: Insufficient documentation

## 2011-08-22 DIAGNOSIS — G43909 Migraine, unspecified, not intractable, without status migrainosus: Secondary | ICD-10-CM | POA: Insufficient documentation

## 2011-08-22 DIAGNOSIS — E119 Type 2 diabetes mellitus without complications: Secondary | ICD-10-CM | POA: Insufficient documentation

## 2011-08-22 DIAGNOSIS — K589 Irritable bowel syndrome without diarrhea: Secondary | ICD-10-CM | POA: Insufficient documentation

## 2011-08-22 DIAGNOSIS — G4700 Insomnia, unspecified: Secondary | ICD-10-CM | POA: Insufficient documentation

## 2011-08-22 DIAGNOSIS — I10 Essential (primary) hypertension: Secondary | ICD-10-CM | POA: Insufficient documentation

## 2011-08-22 DIAGNOSIS — R5382 Chronic fatigue, unspecified: Secondary | ICD-10-CM | POA: Insufficient documentation

## 2011-08-22 DIAGNOSIS — Z9641 Presence of insulin pump (external) (internal): Secondary | ICD-10-CM | POA: Insufficient documentation

## 2011-08-22 DIAGNOSIS — M5126 Other intervertebral disc displacement, lumbar region: Secondary | ICD-10-CM | POA: Insufficient documentation

## 2011-08-22 DIAGNOSIS — Z7982 Long term (current) use of aspirin: Secondary | ICD-10-CM | POA: Insufficient documentation

## 2011-08-22 DIAGNOSIS — Z01812 Encounter for preprocedural laboratory examination: Secondary | ICD-10-CM | POA: Insufficient documentation

## 2011-08-22 DIAGNOSIS — IMO0001 Reserved for inherently not codable concepts without codable children: Secondary | ICD-10-CM | POA: Insufficient documentation

## 2011-08-22 DIAGNOSIS — E785 Hyperlipidemia, unspecified: Secondary | ICD-10-CM | POA: Insufficient documentation

## 2011-08-22 DIAGNOSIS — K219 Gastro-esophageal reflux disease without esophagitis: Secondary | ICD-10-CM | POA: Insufficient documentation

## 2011-08-22 DIAGNOSIS — G2581 Restless legs syndrome: Secondary | ICD-10-CM | POA: Insufficient documentation

## 2011-08-22 DIAGNOSIS — Z01810 Encounter for preprocedural cardiovascular examination: Secondary | ICD-10-CM | POA: Insufficient documentation

## 2011-08-22 DIAGNOSIS — G9332 Myalgic encephalomyelitis/chronic fatigue syndrome: Secondary | ICD-10-CM | POA: Insufficient documentation

## 2011-08-22 DIAGNOSIS — Z6841 Body Mass Index (BMI) 40.0 and over, adult: Secondary | ICD-10-CM | POA: Insufficient documentation

## 2011-08-22 DIAGNOSIS — E669 Obesity, unspecified: Secondary | ICD-10-CM | POA: Insufficient documentation

## 2011-08-22 DIAGNOSIS — Z794 Long term (current) use of insulin: Secondary | ICD-10-CM | POA: Insufficient documentation

## 2011-08-22 LAB — GLUCOSE, CAPILLARY
Glucose-Capillary: 44 mg/dL — CL (ref 70–99)
Glucose-Capillary: 70 mg/dL (ref 70–99)
Glucose-Capillary: 59 mg/dL — ABNORMAL LOW (ref 70–99)
Glucose-Capillary: 72 mg/dL (ref 70–99)
Glucose-Capillary: 57 mg/dL — ABNORMAL LOW (ref 70–99)
Glucose-Capillary: 79 mg/dL (ref 70–99)
Glucose-Capillary: 74 mg/dL (ref 70–99)
Glucose-Capillary: 50 mg/dL — ABNORMAL LOW (ref 70–99)
Glucose-Capillary: 110 mg/dL — ABNORMAL HIGH (ref 70–99)
Glucose-Capillary: 70 mg/dL (ref 70–99)

## 2011-08-22 LAB — BASIC METABOLIC PANEL
BUN: 26 mg/dL — ABNORMAL HIGH (ref 6–23)
CO2: 27 mEq/L (ref 19–32)
Calcium: 9.7 mg/dL (ref 8.4–10.5)
Chloride: 102 mEq/L (ref 96–112)
Creatinine, Ser: 0.73 mg/dL (ref 0.50–1.10)
GFR calc Af Amer: >60 mL/min (ref >60)
GFR calc non Af Amer: >60 mL/min (ref >60)
Glucose, Bld: 84 mg/dL (ref 70–99)
Potassium: 4 mEq/L (ref 3.5–5.1)
Sodium: 140 mEq/L (ref 135–145)

## 2011-08-22 LAB — HCG, SERUM, QUALITATIVE: Preg, Serum: NEGATIVE

## 2011-08-22 LAB — CBC
HCT: 42.3 % (ref 36.0–46.0)
Hemoglobin: 14.1 g/dL (ref 12.0–15.0)
MCH: 29.2 pg (ref 26.0–34.0)
MCHC: 33.3 g/dL (ref 30.0–36.0)
MCV: 87.6 fL (ref 78.0–100.0)
Platelets: 227 10*3/uL (ref 150–400)
RBC: 4.83 MIL/uL (ref 3.87–5.11)
RDW: 14.7 % (ref 11.5–15.5)
WBC: 15.1 10*3/uL — ABNORMAL HIGH (ref 4.0–10.5)

## 2011-08-22 LAB — SURGICAL PCR SCREEN
MRSA, PCR: POSITIVE — AB
Staphylococcus aureus: POSITIVE — AB

## 2011-08-23 LAB — POCT I-STAT GLUCOSE
Glucose, Bld: 57 mg/dL — ABNORMAL LOW (ref 70–99)
Glucose, Bld: 96 mg/dL (ref 70–99)
Operator id: 156951
Operator id: 230421

## 2011-08-23 LAB — GLUCOSE, CAPILLARY
Glucose-Capillary: 133 mg/dL — ABNORMAL HIGH (ref 70–99)
Glucose-Capillary: 171 mg/dL — ABNORMAL HIGH (ref 70–99)

## 2011-08-26 NOTE — Op Note (Signed)
[name], [name]       ACCOUNT NO.:  [id number]  MEDICAL RECORD NO.:  [id number]  LOCATION:  3031                         FACILITY:  [facility]  PHYSICIAN:  Cristi Loron, M.D.DATE OF BIRTH:  [birth year]  DATE OF PROCEDURE:  08/22/2011 DATE OF DISCHARGE:                              OPERATIVE REPORT   BRIEF HISTORY:  The patient is a 45 year old white female who has developed severe back and left leg pain with left foot drop.  She has worked up with a lumbar MRI which demonstrated large herniated disk at L5-S1 on the left.  I discussed the various treatment with the patient and recommended she undergo a left L5-S1 diskectomy to decompress the left L5 as well as left S1 nerve root.  The patient has weighed the risks, benefits and alternatives of surgery and decided to proceed with the operation.  PREOPERATIVE DIAGNOSES:  Left L5-S1 herniated nucleus pulposus, spinal stenosis, lumbar radiculopathy/myelopathy, lumbago.  POSTOPERATIVE DIAGNOSES:  Left L5-S1 herniated nucleus pulposus, spinal stenosis, lumbar radiculopathy/myelopathy, lumbago.  PROCEDURE:  Left L5 hemilaminectomy with left L5-S1 diskectomy to decompress the left L5 as well as left S1 nerve root using microdissection.  SURGEON:  Cristi Loron, MD  ASSISTANT:  Stefani Dama, MD  ANESTHESIA:  General endotracheal.  ESTIMATED BLOOD LOSS:  75 mL.  SPECIMENS:  None.  DRAINS:  None.  COMPLICATIONS:  None.  DESCRIPTION OF PROCEDURE:  The patient was brought to the operating room by the anesthesia team.  General endotracheal anesthesia was induced. The patient was turned to prone position on Wilson frame.  Lumbosacral region was then prepared with Betadine scrub and Betadine solution. Sterile drapes were applied.  I then injected the area to be incised with Marcaine with epinephrine solution.  I then used scalpel to make a linear midline incision over the L5-S1 interspace.  I used  the electrocautery to perform a left-sided subperiosteal section exposing left spinous process and lamina of L5 and upper sacrum.  We obtained intraoperative radiograph to confirm location and inserted the [facility] retractor for exposure.  We then used high-speed drill to perform a left L5 laminotomy.  I completed left L5 hemilaminectomy with Kerrison punch removing L4-5 ligamentum flavum and L5-S1 ligamentum flavum.  We then used microdissection to identify and free up the thecal sac in the left L5 and S1 nerve roots.  We encountered a huge ruptured disks sandwiched between the L5 and S1 nerve roots.  We used microdissection to free up the disk herniation and removed it in multiple fragments using the pituitary forceps decompressing the left L5 and S1 nerve roots.  We then inspected the L5-S1 intervertebral disk.  There was large hole in the annulus.  We therefore incised at the annulus and performed a partial intervertebral bursectomy using pituitary forceps and Epstein curettes. After we satisfied with the intervertebral bursectomy,  we used osteophyte tool to remove some redundant posterior longitudinal ligament from the vertebral endplates L5-S1 further decompressing the neural elements.  We then obtained hemostasis using bipolar electrocautery.  We irrigated the wound out with bacitracin solution.  We then palpated along the ventral surface of the thecal sac along the exit route of the left L5 as well as left S1 nerve  roots and noted they were well decompressed.  I then removed the retractor and reapproximated the patient's thoracolumbar fascia with interrupted #1 Vicryl suture, subcutaneous tissue with 2-0 Vicryl suture and skin with Steri-Strips and Benzoin.  The wound was then coated with bacitracin ointment and sterile dressing was applied.  Drapes were removed and the patient was subsequently returned to supine position where she was extubated by the anesthesia team and  transported to post anesthesia care unit in stable condition.  All sponge, instrument, and needle counts were correct at the end of this case.     Cristi Loron, M.D.     JDJ/MEDQ  D:  08/22/2011  T:  08/23/2011  Job:  [id number]  Electronically Signed by Tressie Stalker M.D. on 08/26/2011 09:05:34 AM

## 2011-08-28 ENCOUNTER — Ambulatory Visit: Payer: Commercial Managed Care - PPO

## 2011-11-03 ENCOUNTER — Emergency Department (HOSPITAL_COMMUNITY)
Admission: EM | Admit: 2011-11-03 | Discharge: 2011-11-03 | Disposition: A | Discharge status: Home / Self Care | Payer: 59 | Source: Home / Self Care | Attending: Family Medicine | Admitting: Family Medicine

## 2011-11-03 ENCOUNTER — Encounter: Payer: Self-pay | Admitting: Emergency Medicine

## 2011-11-03 DIAGNOSIS — J069 Acute upper respiratory infection, unspecified: Secondary | ICD-10-CM

## 2011-11-03 DIAGNOSIS — J40 Bronchitis, not specified as acute or chronic: Secondary | ICD-10-CM

## 2011-11-03 HISTORY — DX: Fibromyalgia: M79.7

## 2011-11-03 HISTORY — DX: Essential (primary) hypertension: I10

## 2011-11-03 HISTORY — DX: Proteinuria, unspecified: R80.9

## 2011-11-03 HISTORY — DX: Gastro-esophageal reflux disease without esophagitis: K21.9

## 2011-11-03 NOTE — ED Notes (Signed)
Cough, sorethroat, runny nose, chest soreness, headache

## 2011-11-03 NOTE — ED Provider Notes (Signed)
History     CSN: [id number] Arrival date & time: 11/03/2011  9:10 AM   First MD Initiated Contact with Patient 11/03/11 [phone]      Chief Complaint  Patient presents with  . URI    (Consider location/radiation/quality/duration/timing/severity/associated sxs/prior treatment) HPI Comments: [name] presents for evaluation of week hx of cough, sore throat, congestion, and nasal congestion. She denies any fever. She already takes Xopenex, [employer], Symbicort. She has already had a Z-pak this week as well. Her husband is sick with similar symptoms. She has been using her Xopenex every 6 hrs as needed but not consistently.   Patient is a 45 y.o. female presenting with URI. The history is provided by the patient.  URI The primary symptoms include fatigue, sore throat, cough, wheezing and myalgias. Primary symptoms do not include fever. The current episode started more than 1 week ago. This is a new problem. The problem has not changed since onset. The cough began more than 1 week ago. The cough is productive. The sputum is clear.  Symptoms associated with the illness include congestion and rhinorrhea.    No past medical history on file.  No past surgical history on file.  No family history on file.  History  Substance Use Topics  . Smoking status: Not on file  . Smokeless tobacco: Not on file  . Alcohol Use: Not on file    OB History    No data available      Review of Systems  Constitutional: Positive for fatigue. Negative for fever.  HENT: Positive for congestion, sore throat and rhinorrhea.   Eyes: Negative.   Respiratory: Positive for cough and wheezing.   Genitourinary: Negative.   Musculoskeletal: Positive for myalgias.  Neurological: Negative.     Allergies  Clarithromycin; Latex; Milk-related compounds; Moxifloxacin; and Penicillins  Home Medications  No current outpatient prescriptions on file.  BP 130/85  Pulse 87  Temp(Src) 97.2 F (36.2 C) (Oral)  Resp 19   SpO2 100%  Physical Exam  Constitutional: She is oriented to person, place, and time. She appears well-developed and well-nourished.  HENT:  Head: Normocephalic and atraumatic.  Right Ear: Tympanic membrane and external ear normal.  Left Ear: Tympanic membrane and external ear normal.  Nose: Nose normal.  Mouth/Throat: Uvula is midline, oropharynx is clear and moist and mucous membranes are normal.  Eyes: EOM are normal. Pupils are equal, round, and reactive to light.  Neck: Normal range of motion.  Cardiovascular: Normal rate and regular rhythm.   Pulmonary/Chest: Effort normal. She has wheezes in the right lower field, the left upper field and the left lower field. She has no rhonchi.  Neurological: She is alert and oriented to person, place, and time.  Skin: Skin is warm and dry.    ED Course  Procedures (including critical care time)  Labs Reviewed - No data to display No results found.   No diagnosis found.    MDM  Already had azithromycin, treated for atypicals; advised continued use of albuterol for bronchitis; no suspicion for pneumonia        Richardo Priest, MD 11/03/11 [phone]

## 2012-11-11 ENCOUNTER — Emergency Department (HOSPITAL_COMMUNITY)
Admission: EM | Admit: 2012-11-11 | Discharge: 2012-11-11 | Disposition: A | Discharge status: Home / Self Care | Payer: 59 | Source: Home / Self Care | Attending: Emergency Medicine | Admitting: Emergency Medicine

## 2012-11-11 ENCOUNTER — Encounter (HOSPITAL_COMMUNITY): Payer: Self-pay

## 2012-11-11 DIAGNOSIS — B9789 Other viral agents as the cause of diseases classified elsewhere: Secondary | ICD-10-CM

## 2012-11-11 DIAGNOSIS — B349 Viral infection, unspecified: Secondary | ICD-10-CM

## 2012-11-11 MED ORDER — ONDANSETRON HCL 4 MG PO TABS: 4.0000 mg | ORAL_TABLET | Freq: Four times a day (QID) | ORAL | Status: DC

## 2012-11-11 MED ORDER — ONDANSETRON 4 MG PO TBDP
ORAL_TABLET | ORAL | Status: AC
  Filled 2012-11-11: qty 2

## 2012-11-11 MED ORDER — ONDANSETRON 4 MG PO TBDP
8.0000 mg | ORAL_TABLET | Freq: Once | ORAL | Status: AC
  Administered 2012-11-11: 8 mg via ORAL

## 2012-11-11 NOTE — ED Provider Notes (Signed)
History     CSN: [id number]  Arrival date & time 11/11/12  1731   First MD Initiated Contact with Patient 11/11/12 1831      Chief Complaint  Patient presents with  . Nausea    (Consider location/radiation/quality/duration/timing/severity/associated sxs/prior treatment) Patient is a 46 y.o. female presenting with URI. The history is provided by the patient.  URI The primary symptoms include fatigue, headaches, sore throat and nausea. Primary symptoms do not include abdominal pain. The current episode started today. This is a new problem.  The onset of the illness is associated with exposure to sick contacts. Symptoms associated with the illness include chills and congestion. The following treatments were addressed: Acetaminophen was ineffective. Risk factors for severe complications from URI include diabetes mellitus.  Seen and evaluated by endocrinologist who manages her diabetes today with no change in medications.    Past Medical History  Diagnosis Date  . Diabetes mellitus   . Hypertension   . GERD (gastroesophageal reflux disease)   . Fibromyalgia   . Proteinuria     Past Surgical History  Procedure Date  . Cholecystectomy   . Abdominal hysterectomy   . Lumbar disc surgery     No family history on file.  History  Substance Use Topics  . Smoking status: Never Smoker   . Smokeless tobacco: Not on file  . Alcohol Use: No    OB History    Grav Para Term Preterm Abortions TAB SAB Ect Mult Living                  Review of Systems  Constitutional: Positive for chills and fatigue. Negative for activity change and appetite change.  HENT: Positive for congestion and sore throat.   Respiratory: Positive for shortness of breath.   Gastrointestinal: Positive for nausea. Negative for abdominal pain.  Neurological: Positive for headaches.  All other systems reviewed and are negative.    Allergies  Clarithromycin; Latex; Milk-related compounds; Moxifloxacin; and  Penicillins  Home Medications   Current Outpatient Rx  Name  Route  Sig  Dispense  Refill  . ALPRAZOLAM 0.5 MG PO TABS   Oral   Take 0.5 mg by mouth at bedtime as needed.         . ASPIRIN 81 MG PO TABS   Oral   Take 81 mg by mouth daily.           . BUDESONIDE-FORMOTEROL FUMARATE 160-4.5 MCG/ACT IN AERO   Inhalation   Inhale 1 puff into the lungs 2 (two) times daily.           . CYCLOBENZAPRINE HCL 10 MG PO TABS   Oral   Take 10 mg by mouth 3 (three) times daily as needed.         Marland Kitchen DICYCLOMINE HCL 10 MG PO CAPS   Oral   Take 10 mg by mouth 2 (two) times daily.           . ERGOCALCIFEROL 50000 UNITS PO CAPS   Oral   Take 50,000 Units by mouth 3 (three) times a week.           . ESOMEPRAZOLE MAGNESIUM 40 MG PO CPDR   Oral   Take 40 mg by mouth 2 (two) times daily.           Marland Kitchen FLUTICASONE PROPIONATE 50 MCG/ACT NA SUSP   Nasal   Place 2 sprays into the nose daily.           Marland Kitchen  FUROSEMIDE 40 MG PO TABS   Oral   Take 40 mg by mouth 2 (two) times daily.           Marland Kitchen HYDROCODONE-ACETAMINOPHEN 7.5-500 MG PO TABS   Oral   Take 1 tablet by mouth every 6 (six) hours as needed.         . INSULIN REGULAR HUMAN (CONC) 500 UNIT/ML [place] SOLN   Subcutaneous   Inject 2 Units into the skin. 2.0 units /hour 0500-2359, 1.5units [id number]          . LEVALBUTEROL TARTRATE 45 MCG/ACT IN AERO   Inhalation   Inhale 1-2 puffs into the lungs every 4 (four) hours as needed.           Marland Kitchen LISINOPRIL 40 MG PO TABS   Oral   Take 40 mg by mouth daily.           Marland Kitchen GLUCOPHAGE PO   Oral   Take 1,000 mg by mouth 2 (two) times daily.           Marland Kitchen METOPROLOL SUCCINATE ER 50 MG PO TB24   Oral   Take 50 mg by mouth daily.           . MULTIVITAMIN PO   Oral   Take by mouth daily.         Marland Kitchen PHENYLEPH-PROMETHAZINE-COD 5-6.25-10 MG/5ML PO SYRP   Oral   Take by mouth as needed. 1 teaspoon every 8 hours as needed for pain         . PROMETHAZINE HCL 25 MG PO  TABS   Oral   Take 25 mg by mouth every 6 (six) hours as needed.         Marland Kitchen ROSUVASTATIN CALCIUM 40 MG PO TABS   Oral   Take 40 mg by mouth daily.         Marland Kitchen ONDANSETRON HCL 4 MG PO TABS   Oral   Take 1 tablet (4 mg total) by mouth every 6 (six) hours.   12 tablet   0   . ROSUVASTATIN CALCIUM 20 MG PO TABS   Oral   Take 20 mg by mouth daily.             BP 139/78  Pulse 125  Temp 98.6 F (37 C) (Oral)  Resp 20  SpO2 98%  Physical Exam  Nursing note and vitals reviewed. Constitutional: She is oriented to person, place, and time. Vital signs are normal. She appears well-developed and well-nourished. She is active and cooperative.  HENT:  Head: Normocephalic.  Right Ear: External ear normal.  Left Ear: External ear normal.  Mouth/Throat: Oropharynx is clear and moist.       Left tm fluid  Eyes: Conjunctivae normal are normal. Pupils are equal, round, and reactive to light. Right eye exhibits no discharge. Left eye exhibits no discharge. No scleral icterus.  Neck: Trachea normal and normal range of motion. Neck supple.  Cardiovascular: Regular rhythm, normal heart sounds, intact distal pulses and normal pulses.  Tachycardia present.   Pulmonary/Chest: Effort normal and breath sounds normal.  Abdominal: Soft. Bowel sounds are increased. There is no tenderness. There is no rebound and no CVA tenderness.  Lymphadenopathy:       Head (right side): No submental, no submandibular, no tonsillar, no preauricular, no posterior auricular and no occipital adenopathy present.       Head (left side): No submental, no submandibular, no tonsillar, no preauricular, no posterior auricular and no occipital  adenopathy present.    She has no cervical adenopathy.  Neurological: She is alert and oriented to person, place, and time. No cranial nerve deficit or sensory deficit.  Skin: Skin is warm and dry. No rash noted.  Psychiatric: She has a normal mood and affect. Her speech is normal and  behavior is normal. Judgment and thought content normal. Cognition and memory are normal.    ED Course  Procedures (including critical care time)  Labs Reviewed - No data to display No results found.   1. Acute viral syndrome       MDM  Increase fluids, zofran for nausea, follow up with pcp as needed.       Johnsie Kindred, NP 11/19/12 1303

## 2012-11-11 NOTE — ED Notes (Signed)
Patient complains of nausea, headache, body aches and fatigue , sx started this morning

## 2012-11-18 ENCOUNTER — Ambulatory Visit (INDEPENDENT_AMBULATORY_CARE_PROVIDER_SITE_OTHER): Payer: Self-pay | Admitting: Family Medicine

## 2012-11-18 DIAGNOSIS — E119 Type 2 diabetes mellitus without complications: Secondary | ICD-10-CM

## 2012-11-18 NOTE — Progress Notes (Signed)
Patient presents today for 3 month DM follow-up as part of the employer-sponsored Link to Wellness program.  Medications, insulin regimen, and glucose monitoring have been reviewed.  I have also discussed with patient lifestyle interventions including diet and exercise.  Details of this visit may be found in [employer] documenting program through [employer] [facility]).  Patient has set a series of personal goals and will follow-up in 3 months for further review of DM.

## 2012-11-19 NOTE — ED Provider Notes (Signed)
Medical screening examination/treatment/procedure(s) were performed by non-physician practitioner and as supervising physician I was immediately available for consultation/collaboration.  Alvera Tourigny, M.D.   Dayna Geurts C Haakon Titsworth, MD 11/19/12 2024 

## 2012-11-26 ENCOUNTER — Encounter: Payer: Self-pay | Admitting: Family Medicine

## 2012-11-26 NOTE — Progress Notes (Signed)
Patient ID: [name], female   DOB: 25-Feb-1966, 46 y.o.   MRN: [id number] Reviewed: Agree with documentation and management.

## 2012-12-24 ENCOUNTER — Encounter (HOSPITAL_COMMUNITY): Payer: Self-pay | Admitting: Emergency Medicine

## 2012-12-24 ENCOUNTER — Emergency Department (HOSPITAL_COMMUNITY)
Admission: EM | Admit: 2012-12-24 | Discharge: 2012-12-24 | Disposition: A | Discharge status: Home / Self Care | Payer: 59 | Attending: Emergency Medicine | Admitting: Emergency Medicine

## 2012-12-24 DIAGNOSIS — M7989 Other specified soft tissue disorders: Secondary | ICD-10-CM

## 2012-12-24 DIAGNOSIS — I10 Essential (primary) hypertension: Secondary | ICD-10-CM | POA: Insufficient documentation

## 2012-12-24 DIAGNOSIS — L03119 Cellulitis of unspecified part of limb: Secondary | ICD-10-CM

## 2012-12-24 DIAGNOSIS — E119 Type 2 diabetes mellitus without complications: Secondary | ICD-10-CM | POA: Insufficient documentation

## 2012-12-24 DIAGNOSIS — Z794 Long term (current) use of insulin: Secondary | ICD-10-CM | POA: Insufficient documentation

## 2012-12-24 DIAGNOSIS — M79609 Pain in unspecified limb: Secondary | ICD-10-CM

## 2012-12-24 DIAGNOSIS — Z7982 Long term (current) use of aspirin: Secondary | ICD-10-CM | POA: Insufficient documentation

## 2012-12-24 DIAGNOSIS — K219 Gastro-esophageal reflux disease without esophagitis: Secondary | ICD-10-CM | POA: Insufficient documentation

## 2012-12-24 DIAGNOSIS — Z8739 Personal history of other diseases of the musculoskeletal system and connective tissue: Secondary | ICD-10-CM | POA: Insufficient documentation

## 2012-12-24 DIAGNOSIS — L02419 Cutaneous abscess of limb, unspecified: Secondary | ICD-10-CM | POA: Insufficient documentation

## 2012-12-24 LAB — CBC WITH DIFFERENTIAL/PLATELET
Basophils Absolute: 0 10*3/uL (ref 0.0–0.1)
Basophils Relative: 0 % (ref 0–1)
Eosinophils Absolute: 0.1 10*3/uL (ref 0.0–0.7)
Eosinophils Relative: 1 % (ref 0–5)
HCT: 42.4 % (ref 36.0–46.0)
Hemoglobin: 14.5 g/dL (ref 12.0–15.0)
Lymphocytes Relative: 36 % (ref 12–46)
Lymphs Abs: 2.9 10*3/uL (ref 0.7–4.0)
MCH: 29.8 pg (ref 26.0–34.0)
MCHC: 34.2 g/dL (ref 30.0–36.0)
MCV: 87.1 fL (ref 78.0–100.0)
Monocytes Absolute: 0.7 10*3/uL (ref 0.1–1.0)
Monocytes Relative: 8 % (ref 3–12)
Neutro Abs: 4.4 10*3/uL (ref 1.7–7.7)
Neutrophils Relative %: 54 % (ref 43–77)
Platelets: 279 10*3/uL (ref 150–400)
RBC: 4.87 MIL/uL (ref 3.87–5.11)
RDW: 13.8 % (ref 11.5–15.5)
WBC: 8 10*3/uL (ref 4.0–10.5)

## 2012-12-24 LAB — BASIC METABOLIC PANEL
BUN: 14 mg/dL (ref 6–23)
CO2: 27 mEq/L (ref 19–32)
Calcium: 10.2 mg/dL (ref 8.4–10.5)
Chloride: 102 mEq/L (ref 96–112)
Creatinine, Ser: 0.67 mg/dL (ref 0.50–1.10)
GFR calc Af Amer: >90 mL/min (ref >90)
GFR calc non Af Amer: >90 mL/min (ref >90)
Glucose, Bld: 76 mg/dL (ref 70–99)
Potassium: 3.8 mEq/L (ref 3.5–5.1)
Sodium: 142 mEq/L (ref 135–145)

## 2012-12-24 MED ORDER — SULFAMETHOXAZOLE-TRIMETHOPRIM 800-160 MG PO TABS: 1.0000 | ORAL_TABLET | Freq: Two times a day (BID) | ORAL | Status: DC

## 2012-12-24 NOTE — ED Notes (Signed)
Pt given discharge paperwork; pt verbalized understanding of discharge; prescriptions discussed with pt; pt had no additional questions; e-signature obtained;

## 2012-12-24 NOTE — ED Notes (Signed)
Ultrasound at bedside

## 2012-12-24 NOTE — ED Provider Notes (Signed)
History   This chart was scribed for non-physician practitioner working with Flint Melter, MD by Gerlean Ren, ED Scribe. This patient was seen in room TR11C/TR11C and the patient's care was started at 6:09 PM.    CSN: [id number]  Arrival date & time 12/24/12  1646   First MD Initiated Contact with Patient 12/24/12 1808      Chief Complaint  Patient presents with  . Leg Pain     The history is provided by the patient. No language interpreter was used.   [name] is a 47 y.o. female with h/o HTN, DM, and fibromyalgia who presents to the Emergency Department complaining of left lower leg redness with associated pain and swelling over area of redness.  Pt has had ongoing infection in left great toe but has no associated left foot pain.  Pt reported to physician for leg pain and was told it was infection related to infection in left great toe.  Pt then reported to podiatrist Dr. Almedia Balls that she has seen regularly for infection in left great toe and was told that leg pain may be related to DVT and was sent here.  No pain in left thigh.  Pt denies any problems with right leg, chest pain, back pain, or associated dyspnea.  Pt had cath within past 5 years for recurrent chest pain and was tachycardic at time.  Pt denies tobacco and alcohol use.   Past Medical History  Diagnosis Date  . Diabetes mellitus   . Hypertension   . GERD (gastroesophageal reflux disease)   . Fibromyalgia   . Proteinuria     Past Surgical History  Procedure Date  . Cholecystectomy   . Abdominal hysterectomy   . Lumbar disc surgery     History reviewed. No pertinent family history.  History  Substance Use Topics  . Smoking status: Never Smoker   . Smokeless tobacco: Not on file  . Alcohol Use: No    No OB history provided.   Review of Systems  Respiratory: Negative for shortness of breath.   Cardiovascular: Negative for chest pain.  Gastrointestinal: Negative for abdominal pain.    Musculoskeletal: Negative for back pain.       Left lower extremity pain and redness, worse over calf of the leg.   Neurological: Negative for numbness.    Allergies  Clarithromycin; Latex; Milk-related compounds; Moxifloxacin; and Penicillins  Home Medications   Current Outpatient Rx  Name  Route  Sig  Dispense  Refill  . ALPRAZOLAM 0.5 MG PO TABS   Oral   Take 0.5 mg by mouth at bedtime as needed.         . ASPIRIN 81 MG PO TABS   Oral   Take 81 mg by mouth daily.           . BUDESONIDE-FORMOTEROL FUMARATE 160-4.5 MCG/ACT IN AERO   Inhalation   Inhale 1 puff into the lungs 2 (two) times daily.           . CYCLOBENZAPRINE HCL 10 MG PO TABS   Oral   Take 10 mg by mouth 3 (three) times daily as needed.         Marland Kitchen DICYCLOMINE HCL 10 MG PO CAPS   Oral   Take 10 mg by mouth 2 (two) times daily.           . ERGOCALCIFEROL 50000 UNITS PO CAPS   Oral   Take 50,000 Units by mouth 3 (three) times a week.           Marland Kitchen  ESOMEPRAZOLE MAGNESIUM 40 MG PO CPDR   Oral   Take 40 mg by mouth 2 (two) times daily.           Marland Kitchen FLUTICASONE PROPIONATE 50 MCG/ACT NA SUSP   Nasal   Place 2 sprays into the nose daily.           . FUROSEMIDE 40 MG PO TABS   Oral   Take 40 mg by mouth 2 (two) times daily.           Marland Kitchen HYDROCODONE-ACETAMINOPHEN 7.5-500 MG PO TABS   Oral   Take 1 tablet by mouth every 6 (six) hours as needed.         . INSULIN REGULAR HUMAN (CONC) 500 UNIT/ML [place] SOLN   Subcutaneous   Inject 2 Units into the skin. 2.0 units /hour 0500-2359, 1.5units [id number]          . LEVALBUTEROL TARTRATE 45 MCG/ACT IN AERO   Inhalation   Inhale 1-2 puffs into the lungs every 4 (four) hours as needed.           Marland Kitchen LISINOPRIL 40 MG PO TABS   Oral   Take 40 mg by mouth daily.           Marland Kitchen GLUCOPHAGE PO   Oral   Take 1,000 mg by mouth 2 (two) times daily.           Marland Kitchen METOPROLOL SUCCINATE ER 50 MG PO TB24   Oral   Take 50 mg by mouth daily.            . MULTIVITAMIN PO   Oral   Take by mouth daily.         Marland Kitchen ONDANSETRON HCL 4 MG PO TABS   Oral   Take 1 tablet (4 mg total) by mouth every 6 (six) hours.   12 tablet   0   . PHENYLEPH-PROMETHAZINE-COD 5-6.25-10 MG/5ML PO SYRP   Oral   Take by mouth as needed. 1 teaspoon every 8 hours as needed for pain         . PROMETHAZINE HCL 25 MG PO TABS   Oral   Take 25 mg by mouth every 6 (six) hours as needed.         Marland Kitchen ROSUVASTATIN CALCIUM 20 MG PO TABS   Oral   Take 20 mg by mouth daily.           Marland Kitchen ROSUVASTATIN CALCIUM 40 MG PO TABS   Oral   Take 40 mg by mouth daily.           BP 160/119  Pulse 127  Temp 98.4 F (36.9 C) (Oral)  Resp 22  SpO2 97%  Physical Exam  Nursing note and vitals reviewed. Constitutional: She is oriented to person, place, and time. She appears well-developed and well-nourished. No distress.  HENT:  Head: Normocephalic and atraumatic.  Eyes: EOM are normal.  Neck: Neck supple. No tracheal deviation present.  Cardiovascular: Regular rhythm and normal heart sounds.        Tachycardic  Pulmonary/Chest: Effort normal and breath sounds normal. No respiratory distress. She has no wheezes.  Abdominal: Soft. There is no tenderness.  Musculoskeletal: Normal range of motion.       Left lower extremity circumferential redness of distal third, no warmth, mild swelling, distal pulses intact, no redness or swelling noted to the foot, generalized tenderness of left lower leg worst posteriorly   Neurological: She is alert and oriented to person, place,  and time.  Skin: Skin is warm and dry.  Psychiatric: She has a normal mood and affect. Her behavior is normal.    ED Course  Procedures (including critical care time) DIAGNOSTIC STUDIES: Oxygen Saturation is 97% on room air, adequate by my interpretation.    COORDINATION OF CARE: 6:16 PM- Patient informed of clinical course, understands medical decision-making process, and agrees with  plan.   Results for orders placed during the hospital encounter of 12/24/12  CBC WITH DIFFERENTIAL      Component Value Range   WBC 8.0  4.0 - 10.5 K/uL   RBC 4.87  3.87 - 5.11 MIL/uL   Hemoglobin 14.5  12.0 - 15.0 g/dL   HCT 40.9  81.1 - 91.4 %   MCV 87.1  78.0 - 100.0 fL   MCH 29.8  26.0 - 34.0 pg   MCHC 34.2  30.0 - 36.0 g/dL   RDW 78.2  95.6 - 21.3 %   Platelets 279  150 - 400 K/uL   Neutrophils Relative 54  43 - 77 %   Neutro Abs 4.4  1.7 - 7.7 K/uL   Lymphocytes Relative 36  12 - 46 %   Lymphs Abs 2.9  0.7 - 4.0 K/uL   Monocytes Relative 8  3 - 12 %   Monocytes Absolute 0.7  0.1 - 1.0 K/uL   Eosinophils Relative 1  0 - 5 %   Eosinophils Absolute 0.1  0.0 - 0.7 K/uL   Basophils Relative 0  0 - 1 %   Basophils Absolute 0.0  0.0 - 0.1 K/uL  BASIC METABOLIC PANEL      Component Value Range   Sodium 142  135 - 145 mEq/L   Potassium 3.8  3.5 - 5.1 mEq/L   Chloride 102  96 - 112 mEq/L   CO2 27  19 - 32 mEq/L   Glucose, Bld 76  70 - 99 mg/dL   BUN 14  6 - 23 mg/dL   Creatinine, Ser 0.86  0.50 - 1.10 mg/dL   Calcium 57.8  8.4 - 46.9 mg/dL   GFR calc non Af Amer >90  >90 mL/min   GFR calc Af Amer >90  >90 mL/min      No diagnosis found. 1. Lower extremity cellulitis   MDM  Doppler negative for DVT. Dr. Effie Shy in to see patient. Will treat for cellulitis with Septra DS. She declines pain medications. At the patient's request, discussed findings with Dr. Ralene Cork who is aware of disposition.  I personally performed the services described in this documentation, which was scribed in my presence. The recorded information has been reviewed and is accurate.         Arnoldo Hooker, PA-C 12/24/12 2051

## 2012-12-24 NOTE — Progress Notes (Signed)
VASCULAR LAB PRELIMINARY  PRELIMINARY  PRELIMINARY  PRELIMINARY  Left lower extremity venous duplex completed.    Preliminary report:  Left:  No evidence of DVT, superficial thrombosis, or Baker's cyst.  [name], RVS 12/24/2012, 7:07 PM

## 2012-12-24 NOTE — ED Notes (Signed)
Pt c/o left leg pain and swelling; pt sent here by PCP for eval for DVT

## 2012-12-24 NOTE — ED Notes (Signed)
[name] is a 47 y.o. female was being evaluated for her leg swelling, and redness. She has an ulcer on her left great toe. That is being treated with local wound care. She's never had a previously. She denies fever, chills, nausea, vomiting, weakness, or dizziness.  Evaluation: Left lower leg swollen, as compared to the right. There is indistinct, erythema anteriorly, semi-confluent, from the area above the sock to the upper shin. No proximal streaking. Left great toe has a bandage, on an ulcer. No left foot, swelling. Patient has tachycardia that has improved, since arrival.  Medical decision making- left lower leg cellulitis, likely from toe source. The patient, stable for discharge, and can be treated with oral medications. She will require leg elevation, and close followup.   Medical screening examination/treatment/procedure(s) were conducted as a shared visit with non-physician practitioner(s) and myself.  I personally evaluated the patient during the encounter     Flint Melter, MD 12/24/12 [phone]

## 2013-01-24 ENCOUNTER — Other Ambulatory Visit: Payer: Self-pay

## 2013-03-10 ENCOUNTER — Ambulatory Visit (INDEPENDENT_AMBULATORY_CARE_PROVIDER_SITE_OTHER): Payer: Self-pay | Admitting: Family Medicine

## 2013-03-10 VITALS — BP 132/92 | Wt 280.0 lb

## 2013-03-10 DIAGNOSIS — E119 Type 2 diabetes mellitus without complications: Secondary | ICD-10-CM

## 2013-03-10 NOTE — Progress Notes (Signed)
Patient presents today for 3 month DM follow-up as part of the employer-sponsored Link to Wellness program. Current regimen includes Humulin U-500 and Metformin. Patient had recent follow-up with Dr. Leslie Dales on 03/02/13. At this time, no new medications were added. There were small adjustments made to patients sensitivity factors. No changes to basal rates at this time. Patient was given information on new Dexcom CGM, as MD would like her to consider using this. She will consider this and will be in touch with Dr. Leslie Dales regarding this possibility.   Diabetes Assessment: MD managing Diabetes Dr. Leslie Dales, endo; checks feet daily Pt is being monitored by a podiatrist every 3 weeks for an ulcer; takes an aspirin a day; takes medications as prescribed; uses glucometer; checks blood glucose 3-4 times a day; Lowest CBG 57; Highest CBG 202; A1c -  Other Diabetes History: Patient continues using Humulin U-500 via pump. No pump setting changes have been made at this time. Patient did have an adjustment made to sensitivity factor in an effort to reduce hypoglycemic episodes. She reports this has been effective. Patient did not bring meter today but continues testing 3-4 times daily. She did bring printed report of glucose readings. Readings range from 57-202. Patient has had multiple hypoglycemic episodes over the previous month, mainly in the 60s. She continues using Verio IQ meter and finds this helpful for tracking trends, given she is not using a CGM at this time. MD has requested that patient increase testing to at least 6 times per day and patient will make an effort to do this. In the past, patient has had difficulty remembering to bolus for each meal. Her pump has now been programmed to audibly remind her to bolus if she has not do so. This has helped her better adhere to bolus schedule.   Patient has had significant issues with slow-healing toe ulcer. This ulcer started in November and has been difficult  to treat. She is now finishing up a course of Septra DS and toe is improving but not 100% healed. Patient is being followed closely by Dr. Ralene Cork for this infection.   Lifestyle Factors: Exercise - Very little routine exercise at this time due to non-healing wound on great toe, and multiple bouts of cellulitis over the past three months. Patient has also been struggling with myopathies in her arms and legs. Despite these struggles, patient continues using the [place] for 15 min daily. Would like to start walking outside track at [facility] if MD will approve. Dr. Ralene Cork is managing foot at this time.   Diet - Patient is making efforts to eat a more balanced diet including small lunches. These are prepackaged lunches that come in healthy combinations such as [place], cheese, and almonds or carrots, PB, and pretzels OR Hummus, pita chips, and apples. She continues to eat very little breakfast. For supper, patient tries to eat at home with her husband, but depending on her work schedule this could be as late as 9 pm. On these late nights, she and her husband are attempting to choose healthier and lighter options vs pasta, spaghetti, etc.   Assessment: Pt returns today with very similar review of DM. Her biggest challenge at this time is treatment of diabetic foot ulcer. She is being followed closely by MD at this time. Patient has made improvements to diet and is attempting to eat more regular meals. Patient has been unable to exercise due to foot ulcer and as a result has gained ~20 lbs. She is very  discouraged by this and is hopeful that her MD will release her for mild exercise soon. We will follow-up in 3 months.  Plan: 1) Continue to monitor foot closely  2) Attempt to increase exercise as tolerated and as approved by MD 3) Continue to eat healthy snacks throughout the work day 4) Attempt to increase testing to 6 times daily per Dr. Leslie Dales. 5) Follow-up in 3 months on Tuesday July 1st @ 4:00  pm

## 2013-03-25 NOTE — Progress Notes (Signed)
Patient ID: [name], female   DOB: 1966/07/28, 47 y.o.   MRN: [id number] ATTENDING PHYSICIAN NOTE: I have reviewed the chart and agree with the plan as detailed above. Denny Levy MD Pager [phone]

## 2013-06-09 ENCOUNTER — Ambulatory Visit (INDEPENDENT_AMBULATORY_CARE_PROVIDER_SITE_OTHER): Payer: 59 | Admitting: Family Medicine

## 2013-06-09 VITALS — BP 138/88 | Wt 279.0 lb

## 2013-06-09 DIAGNOSIS — E119 Type 2 diabetes mellitus without complications: Secondary | ICD-10-CM

## 2013-06-10 NOTE — Progress Notes (Signed)
Patient presents today for 3 mo diabetes follow-up as part of the employer-sponsored Link to Wellness program. Current DM regimen includes Humulin U-500 via pump and Metformin. Patient's diabetes is currently managed by Dr. Leslie Dales and she was last seen this past June. No med changes were made at this time. She will follow-up again in 3 months. A1c at this time was 7.0 via MD office.   Diabetes Assessment: Type of Diabetes: Type 2; Sees Diabetes provider 3 times per year; MD managing Diabetes Dr. Leslie Dales, endo; checks feet daily Pt is being monitored by a podiatrist every 3 weeks for an ulcer. MD reports it is healing appropriately. ; takes an aspirin a day; takes medications as prescribed; uses glucometer; checks blood glucose 3-4 times a day; Lowest CBG 57; Highest CBG 202; Other Diabetes History: Current med regimen includes Humilin U-500 via pump and Metformin 1000 mg twice daily. ISF was recently decreased from 50 to 45 to reduce hypoglycemia. Episodes have resolved and patient is pleased. No changes to basal rate or carb ratio at this time. Patient does maintain good medication compliance. Patient did not bring meter today but is currently testing 3-4 times per day. Per patient recall, glucose is running in 90s fasting with highest reading of 162 and lowest of 68. She does test more regularly if on steroids or if receiving steroid injection for joint pain. Glucose monitoring occurs fasting/pre-prandial/post-prandial/bedtime/when symptomatic. Hypoglycemia frequency is rare after adjustment to ISF. Patient does demonstrate appropriate correction of hypoglycemia. Patient reports signs and symptoms of neuropathy including numbness/tingling/burning and symptoms of foot infection. She continues to struggle with right great toe infection and is still followed closely by Dr. Ralene Cork. She is now on Septra and is hoping to see considerable healing soon. The wound is improved, but not yet closed. Patient has been  instructed to limit time on her feet. Patient is not due for yearly eye exam.  Lifestyle Factors: Exercise - Very little routine exercise at this time due to non-healing wound on great toe. Patient has also been struggling with arthritis pains in multipe joints. Patient was using her gazelle at home despite toe wound, but has been instructed by MD to limit use of this foot as the activity was re-opening the wound. We have discussed sitercise exercises including resistance bands and free weights. Patient reports she had not thought of this as an option and will try this very soon.  Diet - Patient has made efforts to eat a much more balanced diet. She is now drinking a protein shake on way to work, having a mid morning snack, and is now packing a lunch. Lunch often consists of a sandwich or chef boyardee (which could be improved upon, but we will discuss t his later). She is also consuming a mid day snack and preparing a healthier supper with more vegetables.   Assessment: Pt returns today with very similar review of DM. Her biggest challenge continues to be treatment of diabetic foot ulcer. She is being followed closely by MD at this time. Patient has made improvements to diet and is attempting to eat more regular meals. Patient has been unable to exercise due to foot ulcer and continues to struggle with this. She is very discouraged by this and is hopeful that her MD will release her for mild exercise soon. We will follow-up in 3 months.  Plan: 1) Continue making healthy dietary choices 2) Attempt to resume mild exercise, try exercise band 3) Continue testing regularly 4) Follow-up in 3  months on Tuesday October 7th @ 3:00 pm

## 2013-07-17 ENCOUNTER — Other Ambulatory Visit (HOSPITAL_COMMUNITY): Payer: Self-pay | Admitting: Nephrology

## 2013-07-17 DIAGNOSIS — D134 Benign neoplasm of liver: Secondary | ICD-10-CM

## 2013-07-28 ENCOUNTER — Ambulatory Visit (HOSPITAL_COMMUNITY)
Admission: RE | Admit: 2013-07-28 | Discharge: 2013-07-28 | Disposition: A | Discharge status: Home / Self Care | Payer: 59 | Source: Ambulatory Visit | Attending: Nephrology | Admitting: Nephrology

## 2013-07-28 ENCOUNTER — Other Ambulatory Visit: Payer: Self-pay | Admitting: Nephrology

## 2013-07-28 DIAGNOSIS — D134 Benign neoplasm of liver: Secondary | ICD-10-CM

## 2013-07-28 MED ORDER — GADOBENATE DIMEGLUMINE 529 MG/ML IV SOLN: 20.0000 mL | Freq: Once | INTRAVENOUS | Status: DC

## 2013-08-03 ENCOUNTER — Ambulatory Visit
Admission: RE | Admit: 2013-08-03 | Discharge: 2013-08-03 | Disposition: A | Discharge status: Home / Self Care | Payer: 59 | Source: Ambulatory Visit | Attending: Nephrology | Admitting: Nephrology

## 2013-08-03 ENCOUNTER — Other Ambulatory Visit: Payer: Self-pay | Admitting: Nephrology

## 2013-08-03 DIAGNOSIS — D134 Benign neoplasm of liver: Secondary | ICD-10-CM

## 2013-08-03 MED ORDER — GADOBENATE DIMEGLUMINE 529 MG/ML IV SOLN
20.0000 mL | Freq: Once | INTRAVENOUS | Status: AC | PRN
  Administered 2013-08-03: 20 mL via INTRAVENOUS

## 2013-08-31 NOTE — Progress Notes (Signed)
Patient ID: [name], female   DOB: 11/13/1966, 47 y.o.   MRN: [id number] ATTENDING PHYSICIAN NOTE: I have reviewed the chart and agree with the plan as detailed above. Denny Levy MD Pager [phone]

## 2013-10-06 ENCOUNTER — Ambulatory Visit (INDEPENDENT_AMBULATORY_CARE_PROVIDER_SITE_OTHER): Payer: 59

## 2013-10-06 VITALS — BP 126/83 | HR 111 | Temp 98.3°F | Resp 20

## 2013-10-06 DIAGNOSIS — E1140 Type 2 diabetes mellitus with diabetic neuropathy, unspecified: Secondary | ICD-10-CM

## 2013-10-06 DIAGNOSIS — L02619 Cutaneous abscess of unspecified foot: Secondary | ICD-10-CM

## 2013-10-06 DIAGNOSIS — L97509 Non-pressure chronic ulcer of other part of unspecified foot with unspecified severity: Secondary | ICD-10-CM

## 2013-10-06 DIAGNOSIS — E1149 Type 2 diabetes mellitus with other diabetic neurological complication: Secondary | ICD-10-CM

## 2013-10-06 DIAGNOSIS — L03119 Cellulitis of unspecified part of limb: Secondary | ICD-10-CM

## 2013-10-06 DIAGNOSIS — E1142 Type 2 diabetes mellitus with diabetic polyneuropathy: Secondary | ICD-10-CM

## 2013-10-06 MED ORDER — ONDANSETRON HCL 4 MG PO TABS: 4.0000 mg | ORAL_TABLET | Freq: Three times a day (TID) | ORAL | Status: DC | PRN

## 2013-10-06 MED ORDER — SULFAMETHOXAZOLE-TRIMETHOPRIM 800-160 MG PO TABS: 1.0000 | ORAL_TABLET | Freq: Two times a day (BID) | ORAL | Status: DC

## 2013-10-06 NOTE — Progress Notes (Signed)
  Subjective:    Patient ID: [name], female    DOB: [birth year], 47 y.o.   MRN: [id number]  HPI Comments: "My toe has flared back up with this callus"  1st toe left - medial  N - throbbing L - 1st toe left D - couple days O - sudden C - toe started to hurt few days ago, went back into boot, kicked hospital bed at work and now it has          opened up, draining, red, swollen, anterior shin has redness, worse A - walking T - wrapping, silvadene and bandage, continued boot wear      Review of Systems  Constitutional: Positive for fever and fatigue.       Temp this AM: 99.5, better with Lortab  Musculoskeletal: Positive for myalgias.       Fibromyalgia  All other systems reviewed and are negative.       Objective:   Physical Exam  Constitutional: She is oriented to person, place, and time. She appears well-developed and well-nourished.  Cardiovascular:  Pulses:      Dorsalis pedis pulses are 2+ on the right side, and 2+ on the left side.       Posterior tibial pulses are 1+ on the right side, and 1+ on the left side.  Capillary refill time 3 seconds all digits. Skin temperature warm turgor normal + edema bilateral. No varicosities noted.  Musculoskeletal:  Rectus foot type noted bilateral mild flexible digital contractures 2 through 5 bilateral. Hallux interphalangeus left with history of ulceration.  Neurological: She is alert and oriented to person, place, and time. She has normal strength and normal reflexes.  Epicritic sensations diminished on [facility] testing consistent with diabetic neuropathy. Patient has acute pain from an injury trauma to the left great  Skin: Skin is warm and dry. No cyanosis. Nails show no clubbing.  Skin color pigment normal hair growth absent there is an erythematous blistered lesion hallux IP joint left secondary to contusion. Diabetic ulceration of left hallux secondary to neuropathy. Is also some areas cellulitis increased  temperature of the left shin history of recurrence of cellulitis. On debridement there is approximately a 1.5 x 1.8 cm ulceration down to dermal subdermal junction left hallux IPJ  Psychiatric: She has a normal mood and affect. Her behavior is normal.          Assessment & Plan:  Diabetes with neuropathy and neuropathic ulceration proximal the associated contusion or trauma to the left hallux toe as well. The ulcer site is debrided Silvadene and gauze dressing is applied and will continue with Silvadene gauze dressings daily prescriptions for Septra DS and Zofran are called in for patient's this time to recheck in 2 weeks for followup. Maintain dressing changes as instructed. Patient to contact the visiting changes or exacerbations were to occur or any fever chills were to initiate.  Alvan Dame DPM

## 2013-10-06 NOTE — Patient Instructions (Addendum)
Instructions for Wound Care  The most important step to healing a foot wound is to reduce the pressure on your foot - it is extremely important to stay off your foot as much as possible and wear the shoe/boot as instructed.  Cleanse your foot with saline wash or warm soapy water (dial antibacterial soap or similar).  Blot dry.  Apply prescribed medication to your wound and cover with gauze and a bandage.  May hold bandage in place with Coban (self sticky wrap), Ace bandage or tape.  You may find dressing supplies at your local [employer], [employer], drug store or medical supply store.  Your prescribed topical medication is :  Silvadene Cream (twice daily)  [employer] is a mail order medical supply company that we use to provide some of our would care products.  If we use their service of you, you will receive the product by mail.  If you have not received the medication in 3 business days, please call our office.  If you notice any foul odor, increase in pain, pus, increased swelling, red streaks or generalized redness occurring in your foot or leg-Call our office immediately to be seen.  This may be a sign of a limb or life threatening infection that will need prompt attention.  Kahlel Peake, DPM  [facility]  [phone] [place]  [phone] [place] 

## 2013-10-15 ENCOUNTER — Other Ambulatory Visit: Payer: Self-pay

## 2013-10-20 ENCOUNTER — Ambulatory Visit (INDEPENDENT_AMBULATORY_CARE_PROVIDER_SITE_OTHER): Payer: 59

## 2013-10-20 VITALS — BP 138/84 | HR 88 | Temp 97.5°F | Resp 16

## 2013-10-20 DIAGNOSIS — E1140 Type 2 diabetes mellitus with diabetic neuropathy, unspecified: Secondary | ICD-10-CM

## 2013-10-20 DIAGNOSIS — E1142 Type 2 diabetes mellitus with diabetic polyneuropathy: Secondary | ICD-10-CM

## 2013-10-20 DIAGNOSIS — E1149 Type 2 diabetes mellitus with other diabetic neurological complication: Secondary | ICD-10-CM

## 2013-10-20 DIAGNOSIS — L02619 Cutaneous abscess of unspecified foot: Secondary | ICD-10-CM

## 2013-10-20 DIAGNOSIS — L03119 Cellulitis of unspecified part of limb: Secondary | ICD-10-CM

## 2013-10-20 DIAGNOSIS — L97509 Non-pressure chronic ulcer of other part of unspecified foot with unspecified severity: Secondary | ICD-10-CM

## 2013-10-20 MED ORDER — DOXYCYCLINE HYCLATE 100 MG PO TABS: 100.0000 mg | ORAL_TABLET | Freq: Two times a day (BID) | ORAL | Status: DC

## 2013-10-20 NOTE — Patient Instructions (Signed)
Instructions for Wound Care  The most important step to healing a foot wound is to reduce the pressure on your foot - it is extremely important to stay off your foot as much as possible and wear the shoe/boot as instructed.  Cleanse your foot with saline wash or warm soapy water (dial antibacterial soap or similar).  Blot dry.  Apply prescribed medication to your wound and cover with gauze and a bandage.  May hold bandage in place with Coban (self sticky wrap), Ace bandage or tape.  You may find dressing supplies at your local [employer], [employer], drug store or medical supply store.  Your prescribed topical medication is :  Silvadene Cream (twice daily)  [employer] is a mail order medical supply company that we use to provide some of our would care products.  If we use their service of you, you will receive the product by mail.  If you have not received the medication in 3 business days, please call our office.  If you notice any foul odor, increase in pain, pus, increased swelling, red streaks or generalized redness occurring in your foot or leg-Call our office immediately to be seen.  This may be a sign of a limb or life threatening infection that will need prompt attention.  Tristyn Demarest, DPM  [facility]  [phone] [place]  [phone] [place] 

## 2013-10-20 NOTE — Progress Notes (Signed)
  Subjective:    Patient ID: [name], female    DOB: 1966/06/09, 47 y.o.   MRN: [id number] "It's better than it was last time but I know he has to trim it."  HPI patient had flareups also some localized cellulitis ulceration of her left great toe seems to be improving since her last visit to    Review of Systems     Objective:   Physical Exam Neurovascular status is intact pedal pulses palpable bilateral continues to have some pain and some ecchymosis of her left shin area consistent with history of cellulitis in the left leg. The hallux itself is reduced swelling pain or edema no discharge or drainage noted at this time. The result ulceration no active discharge or drainage keratoses is debrided down to the dermal level area this time suggested maintain Silvadene and gauze dressing as there is some slight pinpoint bleeding debridement treated with some Silvadene and gauze dressing was reapplied maintained at this time.       Assessment & Plan:  Healing a resolving ulceration or diabetic ulceration of left great toe. Patient's neuropathy and vascular status unchanged. Still some history of cellulitis left leg which continue to be somewhat tender to touch and palpation although the temperature is back to normal. At this time suggested continues antibiotics however because of the side effects patient has severe nausea with the Septra DS and Zofran and we'll switch over to doxycycline which she has used successfully in the past prescription for doxycycline 100 mg twice a day x10 days is given at this time followup in the next 10 days or less keep appointment starting made in the ask your office for followup.  Alvan Dame DPM

## 2013-10-29 ENCOUNTER — Ambulatory Visit (INDEPENDENT_AMBULATORY_CARE_PROVIDER_SITE_OTHER): Payer: 59

## 2013-10-29 ENCOUNTER — Ambulatory Visit: Payer: Self-pay

## 2013-10-29 VITALS — BP 113/68 | HR 97 | Resp 18

## 2013-10-29 DIAGNOSIS — L97509 Non-pressure chronic ulcer of other part of unspecified foot with unspecified severity: Secondary | ICD-10-CM

## 2013-10-29 DIAGNOSIS — M79609 Pain in unspecified limb: Secondary | ICD-10-CM

## 2013-10-29 DIAGNOSIS — E1140 Type 2 diabetes mellitus with diabetic neuropathy, unspecified: Secondary | ICD-10-CM

## 2013-10-29 DIAGNOSIS — E1149 Type 2 diabetes mellitus with other diabetic neurological complication: Secondary | ICD-10-CM

## 2013-10-29 DIAGNOSIS — E1142 Type 2 diabetes mellitus with diabetic polyneuropathy: Secondary | ICD-10-CM

## 2013-10-29 MED ORDER — GABAPENTIN 300 MG PO CAPS: 300.0000 mg | ORAL_CAPSULE | Freq: Every day | ORAL | Status: DC

## 2013-10-29 NOTE — Progress Notes (Signed)
  Subjective:    Patient ID: [name], female    DOB: November 13, 1966, 47 y.o.   MRN: [id number]  HPI My left toe is doing better and my leg is what is hurting and the bone feels like a deep throbing pain   Review of Systems no changes or. symptoms noted other than continued pain left leg no increased temperature     Objective:   Physical Exam Neurovascular status intact and unchanged pedal pulses palpable epicritic and proprioceptive sensations diminished on [facility] testing to the forefoot and digits. Still concerned hyperesthesia to the mid leg left with some edema is wearing her compression stockings as instructed however by the end of the day is having a lot of pain burning and stinging sensation in the leg consistent with her neuropathy again patient had fibromyalgia has had neuropathy secondary back surgery as well as diabetic neuropathy. Patient is also history cellulitis of his left leg there is no fever no chills no ascending cellulitis or lymphangitis. The ulcer distal aspect of the left great toe is down to about 2-3 mm to dermal level only no purulent discharge or drainage no ascending cellulitis noted       Assessment & Plan:  Diabetic neuropathic ulcer left hallux is debrided Silvadene and gauze dressing applied continue with Silvadene dressing changes daily as instructed. Patient is also prescribed gabapentin 3 mg each bedtime as a trial to help with a diabetic neuropathic pain monitor for any changes or exacerbations of her left leg as far as edema temperature change or discolorations. Recheck in 2-3 weeks for followup patient is completing her and bike regimen and we'll and a gabapentin at this time 18 Silvadene gauze dressing changes to the left hallux ulcer  Alvan Dame DPM

## 2013-10-29 NOTE — Patient Instructions (Signed)
Diabetes and Foot Care Diabetes may cause you to have problems because of poor blood supply (circulation) to your feet and legs. This may cause the skin on your feet to become thinner, break easier, and heal more slowly. Your skin may become dry, and the skin may peel and crack. You may also have nerve damage in your legs and feet causing decreased feeling in them. You may not notice minor injuries to your feet that could lead to infections or more serious problems. Taking care of your feet is one of the most important things you can do for yourself.  HOME CARE INSTRUCTIONS  Wear shoes at all times, even in the house. Do not go barefoot. Bare feet are easily injured.  Check your feet daily for blisters, cuts, and redness. If you cannot see the bottom of your feet, use a mirror or ask someone for help.  Wash your feet with warm water (do not use hot water) and mild soap. Then pat your feet and the areas between your toes until they are completely dry. Do not soak your feet as this can dry your skin.  Apply a moisturizing lotion or petroleum jelly (that does not contain alcohol and is unscented) to the skin on your feet and to dry, brittle toenails. Do not apply lotion between your toes.  Trim your toenails straight across. Do not dig under them or around the cuticle. File the edges of your nails with an emery board or nail file.  Do not cut corns or calluses or try to remove them with medicine.  Wear clean socks or stockings every day. Make sure they are not too tight. Do not wear knee-high stockings since they may decrease blood flow to your legs.  Wear shoes that fit properly and have enough cushioning. To break in new shoes, wear them for just a few hours a day. This prevents you from injuring your feet. Always look in your shoes before you put them on to be sure there are no objects inside.  Do not cross your legs. This may decrease the blood flow to your feet.  If you find a minor scrape,  cut, or break in the skin on your feet, keep it and the skin around it clean and dry. These areas may be cleansed with mild soap and water. Do not cleanse the area with peroxide, alcohol, or iodine.  When you remove an adhesive bandage, be sure not to damage the skin around it.  If you have a wound, look at it several times a day to make sure it is healing.  Do not use heating pads or hot water bottles. They may burn your skin. If you have lost feeling in your feet or legs, you may not know it is happening until it is too late.  Make sure your health care provider performs a complete foot exam at least annually or more often if you have foot problems. Report any cuts, sores, or bruises to your health care provider immediately. SEEK MEDICAL CARE IF:   You have an injury that is not healing.  You have cuts or breaks in the skin.  You have an ingrown nail.  You notice redness on your legs or feet.  You feel burning or tingling in your legs or feet.  You have pain or cramps in your legs and feet.  Your legs or feet are numb.  Your feet always feel cold. SEEK IMMEDIATE MEDICAL CARE IF:   There is increasing redness,   swelling, or pain in or around a wound.  There is a red line that goes up your leg.  Pus is coming from a wound.  You develop a fever or as directed by your health care provider.  You notice a bad smell coming from an ulcer or wound. Document Released: 11/23/2000 Document Revised: 07/29/2013 Document Reviewed: 05/05/2013 [employer] Patient Information 2014 [employer].  

## 2013-11-10 ENCOUNTER — Ambulatory Visit (INDEPENDENT_AMBULATORY_CARE_PROVIDER_SITE_OTHER): Payer: Self-pay | Admitting: Family Medicine

## 2013-11-10 VITALS — BP 146/86 | Wt 269.0 lb

## 2013-11-10 DIAGNOSIS — E119 Type 2 diabetes mellitus without complications: Secondary | ICD-10-CM

## 2013-11-10 NOTE — Progress Notes (Signed)
Patient presents today for 3 month diabetes follow-up as part of the employer-sponsored Link to Wellness program. Current diabetes regimen includes Humulin U-500 and Metformin. Patient also continues on daily ASA, ACEi, and statin. Most recent MD follow-up was June 2014. She is due for a follow-up with endocrinologist, but has not been able to schedule an appt yet. Will do so soon.   Patient has had a couple of med changes since last visit. These include increase in Metoprolol dose due to tachycardia. Addition of gabapentin for worsened neuropathy. And recent doxycycline course for cellulitis flare.   Diabetes Assessment: Type of Diabetes: Type 2; Sees Diabetes provider 3 times per year; MD managing Diabetes Dr. Leslie Dales, endo; takes an aspirin a day; takes medications as prescribed; uses glucometer; checks blood glucose 3-4 times a day; checks feet daily Pt is being monitored by a podiatrist every 2-3 weeks for recurrent ulcer of toe, and recurrent lower extremity cellulitis; A1c 8.1 Other Diabetes History: Current med regimen includes Humilin U-500 via pump and Metformin 1000 mg twice daily. ISF was remains 1:50 during day and 1:45 overnight to reduce hypoglycemia at night starting at 10 pm. Carb ratio of 1:50. Nighttime basal decreased to 1.9 to prevent AM hypo. Daytime rate is 2.0 units of U-500 per hour. Patient is comfortable adjusting basal rates and bolus dosing and will make adjustments between now and endocrinologist visit. Patient does maintain good medication compliance. Patient did not bring meter today but is currently testing 3-4 times per day. Glucose monitoring occurs fasting/pre-prandial/post-prandial/bedtime/when symptomatic. Hypoglycemia frequency is rare. Patient does demonstrate appropriate correction of hypoglycemia. Patient reports signs and symptoms of neuropathy including numbness/tingling/burning and symptoms of foot infection. She continues to struggle with right great toe  infection and is still followed closely by Dr. Ralene Cork. Toe wound has "closed at this time", but is still monitored closely by MD. At this time she is struggling with cellulitis and is currently being treated with doxycycline. Redness remains but pain and warmth have resolved. She will complete doxy course in ~2 days. Patient is also now wearing compression stockings and reports that these help relieve pain in feet and lower legs. However, she does report intense pain in left lower leg upon removal of stocking. Pain lasts for about 15 minutes and then decreases to a throb that remains until replacing the stocking. Patient also reports worsening neuropathy that is now extending up lower leg. She has been prescribed gabapentin for this. Patient is not due for yearly eye exam.  Lifestyle Factors: Exercise - No recent exercise due to cellulitis of lower extremety. MD, Dr. Ralene Cork continues to see patient regularly and will release her for mild exercise when ready. Patient is hopeful this will be soon as she is struggling with inability to exercise and lose weight. She is hopeful he will release her for mild walking. She would like to walk track at [facility]. She does have appropriate shoes for walking and has two pair that she alternates.  Diet - Patient has made efforts to eat a much more balanced diet. She is now a small serving of grits or oatmeal for breakfast, having a mid morning snack, and is now packing a lunch. Lunch often consists of a sandwich meat/cheese roll. She has stopped eating chef boyardee, which I am glad of. She is also consuming a mid day snack and preparing a healthier supper with more vegetables. Patient does report eating more beans lately, but is watching portion size. She limits corn to twice per  week. Patients husband does not like lettuce, so they are now trying to eat more spinach salads. Patient has reduced cereal including honey nut cheerios. Patient is now snacking on Almonds (roasted or  sea salt) and pre-cut vegetable (brocolli, snap peas, cauliflower, carrots, etc) as a snack. Patient is also trying to eat yogurt daily in order to replace flora potentially altered by frequent antibiotics.  Assessment: Pt returns today with very similar review of DM. Her biggest challenge continues to be treatment of diabetic foot ulcer and cellulitis. These things greatly limit her mobility. She is being followed closely by MD at this time. Patient has made improvements to diet and is attempting to eat more regular meals. She is very discouraged by this and is hopeful that her MD will release her for mild exercise soon. We will follow-up in 3 months.  Plan: 1) Continue to make healthy dietary choices, increase vegetables 2) After talking with MD, attempt to resume mild exercise 3) Continue testing regularly, especially after meals in order to adjust  4) Follow-up with Dr. Leslie Dales when schedule allows 5) Follow-up with [facility] in 3 months on Tuesday March 3rd @ 9:30 am

## 2013-11-12 ENCOUNTER — Ambulatory Visit (INDEPENDENT_AMBULATORY_CARE_PROVIDER_SITE_OTHER): Payer: 59

## 2013-11-12 VITALS — BP 112/64 | HR 102 | Resp 18

## 2013-11-12 DIAGNOSIS — E1140 Type 2 diabetes mellitus with diabetic neuropathy, unspecified: Secondary | ICD-10-CM

## 2013-11-12 DIAGNOSIS — M79609 Pain in unspecified limb: Secondary | ICD-10-CM

## 2013-11-12 DIAGNOSIS — E1142 Type 2 diabetes mellitus with diabetic polyneuropathy: Secondary | ICD-10-CM

## 2013-11-12 DIAGNOSIS — E1149 Type 2 diabetes mellitus with other diabetic neurological complication: Secondary | ICD-10-CM

## 2013-11-12 DIAGNOSIS — L97509 Non-pressure chronic ulcer of other part of unspecified foot with unspecified severity: Secondary | ICD-10-CM

## 2013-11-12 NOTE — Progress Notes (Signed)
   Subjective:    Patient ID: [name], female    DOB: [birth year], 47 y.o.   MRN: [id number]  HPI it is better and i did finish my doxycycline and it is good at night the pain is better with the neurontin On my left foot    Review of Systems no new changes or findings at this time     Objective:   Physical Exam Neurovascular status is intact and unchanged. The ulcer of the left hallux has resolved completely there is no discharge or drainage some mild hemorrhage keratoses is debrided with no active bleeding or discharge. Patient continues to have profound neuropathy with paresthesias the Neurontin has been helping patient been taking gabapentin 3 mg each bedtime. Patient also maintain compression stockings monitoring her work activities. Maintain appropriate accommodative shoes and insoles at all times. The area of cellulitis in the left shin has improved no increased temperature no breakdown in the skin noted current time       Assessment & Plan:  Assessment this time is resolved in stable ulceration left great toe secondary to diabetic neuropathy and arthropathy. Patient also has some back or lumbar radiculopathy symptoms which may be contributing to muscle weakness of her left foot and leg as well. We'll continue to monitor for any gait abnormalities and difficulties recheck in one month for long-term followup the hallux site is cleansed and maintain Silvadene and gauze wrap to keep the area cushioned and prevent further breakdown.  Alvan Dame DPM

## 2013-11-12 NOTE — Patient Instructions (Signed)
Diabetes and Foot Care Diabetes may cause you to have problems because of poor blood supply (circulation) to your feet and legs. This may cause the skin on your feet to become thinner, break easier, and heal more slowly. Your skin may become dry, and the skin may peel and crack. You may also have nerve damage in your legs and feet causing decreased feeling in them. You may not notice minor injuries to your feet that could lead to infections or more serious problems. Taking care of your feet is one of the most important things you can do for yourself.  HOME CARE INSTRUCTIONS  Wear shoes at all times, even in the house. Do not go barefoot. Bare feet are easily injured.  Check your feet daily for blisters, cuts, and redness. If you cannot see the bottom of your feet, use a mirror or ask someone for help.  Wash your feet with warm water (do not use hot water) and mild soap. Then pat your feet and the areas between your toes until they are completely dry. Do not soak your feet as this can dry your skin.  Apply a moisturizing lotion or petroleum jelly (that does not contain alcohol and is unscented) to the skin on your feet and to dry, brittle toenails. Do not apply lotion between your toes.  Trim your toenails straight across. Do not dig under them or around the cuticle. File the edges of your nails with an emery board or nail file.  Do not cut corns or calluses or try to remove them with medicine.  Wear clean socks or stockings every day. Make sure they are not too tight. Do not wear knee-high stockings since they may decrease blood flow to your legs.  Wear shoes that fit properly and have enough cushioning. To break in new shoes, wear them for just a few hours a day. This prevents you from injuring your feet. Always look in your shoes before you put them on to be sure there are no objects inside.  Do not cross your legs. This may decrease the blood flow to your feet.  If you find a minor scrape,  cut, or break in the skin on your feet, keep it and the skin around it clean and dry. These areas may be cleansed with mild soap and water. Do not cleanse the area with peroxide, alcohol, or iodine.  When you remove an adhesive bandage, be sure not to damage the skin around it.  If you have a wound, look at it several times a day to make sure it is healing.  Do not use heating pads or hot water bottles. They may burn your skin. If you have lost feeling in your feet or legs, you may not know it is happening until it is too late.  Make sure your health care provider performs a complete foot exam at least annually or more often if you have foot problems. Report any cuts, sores, or bruises to your health care provider immediately. SEEK MEDICAL CARE IF:   You have an injury that is not healing.  You have cuts or breaks in the skin.  You have an ingrown nail.  You notice redness on your legs or feet.  You feel burning or tingling in your legs or feet.  You have pain or cramps in your legs and feet.  Your legs or feet are numb.  Your feet always feel cold. SEEK IMMEDIATE MEDICAL CARE IF:   There is increasing redness,   swelling, or pain in or around a wound.  There is a red line that goes up your leg.  Pus is coming from a wound.  You develop a fever or as directed by your health care provider.  You notice a bad smell coming from an ulcer or wound. Document Released: 11/23/2000 Document Revised: 07/29/2013 Document Reviewed: 05/05/2013 [employer] Patient Information 2014 [employer].  

## 2014-01-21 ENCOUNTER — Ambulatory Visit (INDEPENDENT_AMBULATORY_CARE_PROVIDER_SITE_OTHER): Payer: 59

## 2014-01-21 VITALS — BP 109/71 | HR 81 | Resp 16

## 2014-01-21 DIAGNOSIS — L97509 Non-pressure chronic ulcer of other part of unspecified foot with unspecified severity: Secondary | ICD-10-CM

## 2014-01-21 DIAGNOSIS — E1149 Type 2 diabetes mellitus with other diabetic neurological complication: Secondary | ICD-10-CM

## 2014-01-21 DIAGNOSIS — E1142 Type 2 diabetes mellitus with diabetic polyneuropathy: Secondary | ICD-10-CM

## 2014-01-21 DIAGNOSIS — E1140 Type 2 diabetes mellitus with diabetic neuropathy, unspecified: Secondary | ICD-10-CM

## 2014-01-21 NOTE — Patient Instructions (Signed)
ANTIBACTERIAL SOAP INSTRUCTIONS  THE DAY AFTER PROCEDURE  Please follow the instructions your doctor has marked.   Shower as usual. Before getting out, place a drop of antibacterial liquid soap (Dial) on a wet, clean washcloth.  Gently wipe washcloth over affected area.  Afterward, rinse the area with warm water.  Blot the area dry with a soft cloth and cover with antibiotic ointment (neosporin, polysporin, bacitracin) and band aid or gauze and tape  Place 3-4 drops of antibacterial liquid soap in a quart of warm tap water.  Submerge foot into water for 20 minutes.  If bandage was applied after your procedure, leave on to allow for easy lift off, then remove and continue with soak for the remaining time.  Next, blot area dry with a soft cloth and cover with a bandage.  Apply other medications as directed by your doctor, after cleansing apply Silvadene and gauze dressing daily

## 2014-01-21 NOTE — Progress Notes (Signed)
   Subjective:    Patient ID: [name], female    DOB: [birth year], 48 y.o.   MRN: [id number]  HPI The base of the big toe on the back side has a crack in it that came up this morning and no draining on the left     Review of Systems no new changes or findings     Objective:   Physical Exam Neurovascular status is intact with pedal pulses palpable DP postal for PT plus one over 4 left foot patient developed some keratoses and a skin fissure and very superficial ulcer down to dermal subdermal junction of the hallux sulcus area plantar left great toe. There was some slight bloody drainage that you notice this morning and call immediately to be seen. There is no increased temperature no ascending cellulitis no infection no purulent discharge or drainage no malodor. Patient continues to have profound diabetic neuropathy absent sensation of the toe there is history of ulceration there is some mild hemorrhage a keratosis around the hallux IP joint plantar left hallux however no other active ulcer noted except a small skin fissure proximally 3 mm x 1 mm. At this time the keratosis around the ulcer site is debrided Silvadene and gauze dressings applied and will maintain Silvadene gauze dressings daily reappointed 3 for follow       Assessment & Plan:  Diabetes with peripheral neuropathy and ulcer of hallux actually skin fissure secondary dry callused skin. Will initiate Silvadene and gauze dressings daily debridement keratoses recheck in 2-3 for followup contact us with any exacerbations fever chills or any other symptomology were to develop requiring antibiotic next [name] DPM

## 2014-02-04 ENCOUNTER — Ambulatory Visit: Payer: 59

## 2014-02-23 ENCOUNTER — Ambulatory Visit (INDEPENDENT_AMBULATORY_CARE_PROVIDER_SITE_OTHER): Payer: 59 | Admitting: Podiatrist

## 2014-02-23 ENCOUNTER — Encounter: Payer: Self-pay | Admitting: Podiatrist

## 2014-02-23 VITALS — BP 125/86 | HR 86 | Temp 97.9°F | Resp 18

## 2014-02-23 DIAGNOSIS — E1142 Type 2 diabetes mellitus with diabetic polyneuropathy: Secondary | ICD-10-CM

## 2014-02-23 DIAGNOSIS — E1149 Type 2 diabetes mellitus with other diabetic neurological complication: Secondary | ICD-10-CM

## 2014-02-23 DIAGNOSIS — L97509 Non-pressure chronic ulcer of other part of unspecified foot with unspecified severity: Secondary | ICD-10-CM

## 2014-02-23 DIAGNOSIS — E1140 Type 2 diabetes mellitus with diabetic neuropathy, unspecified: Secondary | ICD-10-CM

## 2014-02-23 MED ORDER — CLINDAMYCIN HCL 300 MG PO CAPS: 300.0000 mg | ORAL_CAPSULE | Freq: Three times a day (TID) | ORAL | Status: DC

## 2014-02-23 NOTE — Progress Notes (Signed)
   Chief Complaint  Patient presents with  . Foot Ulcer    left big toe     HPI: Patient is 48 y.o. female who presents today for a new onset of an ulceration on the left great toe.  [name] states she was at a conference for 2 days where she sat for 8 hours in her compression stockings each day. She states after this conference she noticed pain in her left big toe. She took off the compression stockings and a Band-Aid covering the previous fissure and stated that a lot of fluid came out of the toe and she had a blood blister.. She states that the toe became red and swollen and draining She became concerned and contacted our office.     Physical Exam Vascular status continues to reveal palpable pedal pulses at 2/4 DP and PT left. Capillary refill time is within normal limits left. Neurological sensation is decreased bilateral profound peripheral neuropathy especially to the left great toe.. Dermatological examination reveals an ulceration on the plantar medial aspect of the left hallux. A blister has also occurred on the dorsal and plantar aspect of the toe which tunnels superficially and plantarly. Once debrided pus is found to be within the tunneling which is easily removed. The central area of ulceration measures 6 mm in diameter and has a fibro granular base. No probing to bone, no active drainage, no redness beyond the toe itself. No cellulitis or lymphangitis present.  Assessment: Abscess, ulceration left great toe  Plan: A thorough debridement was carried out today without complication. The area was cleansed with wound wash and Iodosorb and a dry sterile compressive dressing was applied. The patient was instructed to be out of work for the next 4 days in order to elevate her foot consistently as a preventative for cellulitis which she has experienced multiple times. I also started her on clindamycin to start taking today. She be seen back in one week for recheck if she notices any increase in  redness, swelling or worsening of the ulceration she is instructed to call immediately.

## 2014-03-03 ENCOUNTER — Ambulatory Visit (INDEPENDENT_AMBULATORY_CARE_PROVIDER_SITE_OTHER): Payer: 59

## 2014-03-03 VITALS — BP 119/78 | HR 90 | Resp 18

## 2014-03-03 DIAGNOSIS — L97509 Non-pressure chronic ulcer of other part of unspecified foot with unspecified severity: Secondary | ICD-10-CM

## 2014-03-03 DIAGNOSIS — E1140 Type 2 diabetes mellitus with diabetic neuropathy, unspecified: Secondary | ICD-10-CM

## 2014-03-03 DIAGNOSIS — M79609 Pain in unspecified limb: Secondary | ICD-10-CM

## 2014-03-03 NOTE — Progress Notes (Signed)
   Subjective:    Patient ID: [name], female    DOB: 10/31/1966, 48 y.o.   MRN: [id number]  HPI I was here last week and Dr. Valentina Lucks trimmed on it and it feels like a 6 out of 10 and there is some discoloration on my leg again and throbs in my big toe    Review of Systems  Constitutional: Negative.   Cardiovascular: Positive for leg swelling.  Skin: Positive for wound.  Neurological: Positive for numbness.  All other systems reviewed and are negative.       Objective:   Physical Exam Asked her status is intact DP pulse +2 for PT posterior were for bilateral Refill time 3 seconds all digits neurologically grossly diminished epicritic and profound loss of sensation noted patient is a peripheral neuropathy there is neurologically there is some dry scaling skin in blistering ulceration with some loose fragments in layers the skin and the plantar and plantar medial aspect of left hallux at the IP joint. This is a residual blistering which occurred there's some mild bloody drainage no purulent discharge or drainage noted this time there is no increased temperature no ascending cellulitis lymphangitis the leg is also normal temperature this is improved since patient been on clindamycin antibiotic prescribed by Dr. Osie Bond at last visit. There still some tenderness and bruising sensation with throbbing of her left great toe there is no deep sinus tract does not probe down to bone or capsule the ulcers on the dermal subdermal junction only less than half centimeter in diameter plantar medial aspect of the hallux IP joint.       Assessment & Plan:  The superficial ulcer blister with resolving cellulitis left great toe with continued antibiotics osseous and no further pain recommend maintain Darco shoe to offload continue daily dressing changes Silvadene and gauze dressing daily after cleansing with soap and water medial and occasional Epson salt soaks. Patient recheck in 2 weeks for  followup complete her antibiotic regimen as instructed and offload may return to work next coming Monday. Recheck in 2 weeks for followup  [name] DPM

## 2014-03-03 NOTE — Patient Instructions (Signed)
ANTIBACTERIAL SOAP INSTRUCTIONS  THE DAY AFTER PROCEDURE  Please follow the instructions your doctor has marked.   Shower as usual. Before getting out, place a drop of antibacterial liquid soap (Dial) on a wet, clean washcloth.  Gently wipe washcloth over affected area.  Afterward, rinse the area with warm water.  Blot the area dry with a soft cloth and cover with antibiotic ointment (neosporin, polysporin, bacitracin) and band aid or gauze and tape  Place 3-4 drops of antibacterial liquid soap in a quart of warm tap water.  Submerge foot into water for 20 minutes.  If bandage was applied after your procedure, leave on to allow for easy lift off, then remove and continue with soak for the remaining time.  Next, blot area dry with a soft cloth and cover with a bandage.  After cleansing thoroughly and drying apply Silvadene and gauze dressing as instructed daily.    Maintain Darco surgical shoe until completely resolved, suggest likely return to work activities this coming Monday 03/08/14

## 2014-03-16 ENCOUNTER — Ambulatory Visit (INDEPENDENT_AMBULATORY_CARE_PROVIDER_SITE_OTHER): Payer: Self-pay | Admitting: Family Medicine

## 2014-03-16 VITALS — BP 138/86 | Wt 262.0 lb

## 2014-03-16 DIAGNOSIS — E119 Type 2 diabetes mellitus without complications: Secondary | ICD-10-CM

## 2014-03-17 ENCOUNTER — Ambulatory Visit (INDEPENDENT_AMBULATORY_CARE_PROVIDER_SITE_OTHER): Payer: 59

## 2014-03-17 VITALS — BP 104/71 | HR 99 | Resp 18

## 2014-03-17 DIAGNOSIS — M797 Fibromyalgia: Secondary | ICD-10-CM

## 2014-03-17 DIAGNOSIS — E1142 Type 2 diabetes mellitus with diabetic polyneuropathy: Secondary | ICD-10-CM

## 2014-03-17 DIAGNOSIS — E1140 Type 2 diabetes mellitus with diabetic neuropathy, unspecified: Secondary | ICD-10-CM

## 2014-03-17 DIAGNOSIS — IMO0001 Reserved for inherently not codable concepts without codable children: Secondary | ICD-10-CM

## 2014-03-17 DIAGNOSIS — E1149 Type 2 diabetes mellitus with other diabetic neurological complication: Secondary | ICD-10-CM

## 2014-03-17 DIAGNOSIS — M79609 Pain in unspecified limb: Secondary | ICD-10-CM

## 2014-03-17 DIAGNOSIS — L97509 Non-pressure chronic ulcer of other part of unspecified foot with unspecified severity: Secondary | ICD-10-CM

## 2014-03-17 MED ORDER — AMMONIUM LACTATE 12 % EX CREA: TOPICAL_CREAM | CUTANEOUS | Status: DC | PRN

## 2014-03-17 NOTE — Progress Notes (Signed)
   Subjective:    Patient ID: [name], female    DOB: [birth year], 48 y.o.   MRN: [id number]  HPI it does look better but it still hurts on my left big toe    Review of Systems nothing systemic changes or findings at this time.     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 Neurovascular status is unchanged pedal pulses are palpable DP +2/4 PT plus one over 4. Capillary refill time 3 seconds all digits blistering have resolved patient profound loss of epicritic sensation Harris having pain throbbing aching pain of her left great toe patient does have fibromyalgia this may be contributing factor to her persistent symptomology which is exacerbated by the recent infection the toe. The second infection is resolved there is very superficial ulceration and skin fissure down to dermal level only does not go to subcutaneous tissue level this time this is debrided of any eschar tissue and keratoses from the hallux plantar and medial surface of the is debrided down and dermal level Silvadene and gauze dressing is applied and will be maintained patient continues to have fissuring and dry no skin at this time dispensed a prescription for Lac-Hydrin cream to be applied to 3 times daily as instructed help with the dry skin secondary diabetes neuropathy.       Assessment & Plan:  Ulcer site is improving still ulcer down to dermal level debridement at this time Silvadene gauze dressing applied recheck in 2-3 for further followup will initiate topical  Lac-Hydrin application to help prevent further breakdown and dry and fissuring the skin reappointed 3 weeks next progress [name] DP

## 2014-03-17 NOTE — Patient Instructions (Signed)
ANTIBACTERIAL SOAP INSTRUCTIONS  THE DAY AFTER PROCEDURE  Please follow the instructions your doctor has marked.   Shower as usual. Before getting out, place a drop of antibacterial liquid soap (Dial) on a wet, clean washcloth.  Gently wipe washcloth over affected area.  Afterward, rinse the area with warm water.  Blot the area dry with a soft cloth and cover with antibiotic ointment (neosporin, polysporin, bacitracin) and band aid or gauze and tape  Place 3-4 drops of antibacterial liquid soap in a quart of warm tap water.  Submerge foot into water for 20 minutes.  If bandage was applied after your procedure, leave on to allow for easy lift off, then remove and continue with soak for the remaining time.  Next, blot area dry with a soft cloth and cover with a bandage. After washing and drying thoroughly applying Silvadene and gauze dressing as instructed

## 2014-03-17 NOTE — Progress Notes (Signed)
Patient presents today for 3 month diabetes follow-up as part of the employer-sponsored [facility] program. Current diabetes regimen includes Humulin U-500 via pump and Metformin. Patient also continues on daily ASA, ACEi, and statin. Most recent MD follow-up was June 2014 with Dr. Elyse Hsu, endo. Patient has a pending appt for this week with MD. No med changes this time.  Of note, patient recently finished a course of clindamycin for treatment of toe ulcer. Left great toe is infected and has been lanced and drained. Patient has a recurring infection on that specific toe. She is now wearing an open toe surgical shoe until infection clears. Patient is very disappointed that infection has returned once again. She reports the toe "reopened" while she was attending a conference.   Also,paitent is now experiencing a local skin reaction. These present at red welps and occur randomly, lasting from several minutes to several hours. No itching or pain associated, only redness.  Diabetes Assessment: Type of Diabetes: Type 2; Sees Diabetes provider 3 times per year; MD managing Diabetes Dr. Elyse Hsu, endo; takes an aspirin a day; takes medications as prescribed; uses glucometer; checks feet daily Pt is being monitored by a podiatrist every 2-3 weeks for recurrent ulcer of toe, and recurrent lower extremity cellulitis; checks blood glucose 2 times a day; A1c 8.5 (prev 8.1) Other Diabetes History: Current med regimen includes Humilin U-500 via pump and Metformin 1000 mg twice daily. ISF was remains 1:50 during day and 1:45 overnight to reduce hypoglycemia at night starting at 10 pm. Carb ratio of 1:50. Patient remains comfortable adjusting basal rates and bolus dosing and will make adjustments as needed. However, I have recommended patient begin testing more often so she can more accurately adjust her basal rates. Patient does maintain good medication compliance. Patient did not bring meter today but is currently  testing 2-3 times per day, she does admit to not testing as regularly as she should. She is very frustrated with her DM care and often just doesn't take the time to manage it as she should. Glucose monitoring occurs fasting/pre-prandial/post-prandial/bedtime/when symptomatic. Hypoglycemia frequency is rare. Patient does demonstrate appropriate correction of hypoglycemia. Patient reports signs and symptoms of neuropathy including numbness/tingling/burning and symptoms of foot infection. She continues to struggle with left great toe infection and is still followed closely by Dr. Blenda Mounts. Toe wound opened at a recent conference and patient underwent debridement of the toe recently, completed a course of antibiotic, and is wearing a surgical shoe until total healing occurs. Patient is not due for yearly eye exam. A1c has worsened at this time and is now 8.5 (prev 8.1).  Lifestyle Factors: Exercise - No recent exercise due to left toe ulcer. Patient has been instructed to remain off foot as much as possible until complete healing occurs. MD, Dr. Blenda Mounts continues to see patient regularly and will release her for mild exercise when able. Patient continues to be frustrated with her inability to exercise. I have encouraged her to try chair exercises for now.  Diet - Patient has made some dietary improvements. She is now eating 3 meals per day plus two small snacks between meals. She is now eating a protein shake at the beginning of her work day (14 carbs per serving). She often has a mid-morning snack of either an applesauce or jello cup.  Lunch consists of a [place] sandwich (eats only half of the bread). She has a mid-afternoon snack of either jello cup or craisins. Supper is often a meat and  two vegetables, often chicken tenders with spinach, collards, beans, brussel sprouts, asparagus, etc.   Assessment: Pt returns today with very similar review of DM. A1c has increased from 8.1 to 8.5. Her biggest challenge  continues to be treatment of diabetic foot ulcer. I am confident her infection will not adequately clear without improvement in diabetes management. At this time patient is very frustrated with her recurrent infection and limited mobility. She is being followed closely by podiatrist at this time. Patient has made improvements to diet and is attempting to eat more regular meals. We will follow-up in 3 months.  Plan: 1) Continue making healthy choices  2) Attempt to try chair exercises twice per week 3) Continue testing and attempt to increase to testing before each meal 4) Follow-up with [place] on 15th 5) Follow-up in 3 months with [facility] on Tuesday July 7th @ 4:00 pm

## 2014-03-30 ENCOUNTER — Emergency Department (INDEPENDENT_AMBULATORY_CARE_PROVIDER_SITE_OTHER)
Admission: EM | Admit: 2014-03-30 | Discharge: 2014-03-30 | Disposition: A | Discharge status: Home / Self Care | Payer: Worker's Compensation | Source: Home / Self Care | Attending: Emergency Medicine | Admitting: Emergency Medicine

## 2014-03-30 ENCOUNTER — Encounter (HOSPITAL_COMMUNITY): Payer: Self-pay | Admitting: Emergency Medicine

## 2014-03-30 DIAGNOSIS — S335XXA Sprain of ligaments of lumbar spine, initial encounter: Secondary | ICD-10-CM

## 2014-03-30 DIAGNOSIS — S39012A Strain of muscle, fascia and tendon of lower back, initial encounter: Secondary | ICD-10-CM

## 2014-03-30 DIAGNOSIS — Y9210 Unspecified residential institution as the place of occurrence of the external cause: Secondary | ICD-10-CM

## 2014-03-30 DIAGNOSIS — Y93F2 Activity, caregiving, lifting: Secondary | ICD-10-CM

## 2014-03-30 MED ORDER — KETOROLAC TROMETHAMINE 60 MG/2ML IM SOLN
60.0000 mg | Freq: Once | INTRAMUSCULAR | Status: AC
  Administered 2014-03-30: 60 mg via INTRAMUSCULAR

## 2014-03-30 MED ORDER — KETOROLAC TROMETHAMINE 60 MG/2ML IM SOLN
INTRAMUSCULAR | Status: AC
  Filled 2014-03-30: qty 2

## 2014-03-30 MED ORDER — OXYCODONE-ACETAMINOPHEN 5-325 MG PO TABS: ORAL_TABLET | ORAL | Status: DC

## 2014-03-30 MED ORDER — CYCLOBENZAPRINE HCL 5 MG PO TABS: 5.0000 mg | ORAL_TABLET | Freq: Three times a day (TID) | ORAL | Status: DC | PRN

## 2014-03-30 MED ORDER — MELOXICAM 15 MG PO TABS: 15.0000 mg | ORAL_TABLET | Freq: Every day | ORAL | Status: DC

## 2014-03-30 NOTE — ED Provider Notes (Signed)
Chief Complaint   Chief Complaint  Patient presents with  . 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 She felt a sudden onset of a steering pain in the lower midline lumbar spine. This did not radiate into her legs. The pain was worse if she would stand up from a sitting position and better if she would walk. She denies any numbness, tingling, weakness in her legs. She's had no bladder or bowel dysfunction or saddle anesthesia. She has had a history of back surgery in the past with an L5-S1 disc done by Dr. Arnoldo Morale to 3 years ago. She's also had a bulging disc and had epidurals.  Review of Systems   Other than as noted above, the patient denies any of the following symptoms: Systemic:  No fever, chills, or unexplained weight loss. GI:  No abdominal painor incontinence of bowel. GU:  No dysuria, frequency, urgency, or hematuria. No incontinence of urine or urinary retention.  M-S:  No neck pain or arthritis. Neuro:  No paresthesias, headache, saddle anesthesia, muscular weakness, or progressive neurological deficit.  [place]   Past medical history, family history, social history, meds, and allergies were reviewed. Specifically, there is no history of cancer, major trauma, osteoporosis, immunosuppression, or HIV infection. She's allergic to penicillin. She is on many medications including alprazolam, aspirin, Lipitor, Flexeril, until, vitamin D, Flonase, Lasix, Neurontin, insulin, metoprolol, metformin, meloxicam, lisinopril, Zofran, and promethazine. She has a history of diabetes, hypertension, gastroesophageal reflux, hyperlipidemia, allergies, IBS, and fibromyalgia.  Physical Examination    Vital signs:  BP 166/65  Pulse 118  Temp(Src) 97.8 F (36.6 C) (Oral)  Resp 17  SpO2 100% General:  Alert, oriented, in no distress. Abdomen:  Soft,  non-tender.  No organomegaly or mass.  No pulsatile midline abdominal mass or bruit. Back:  Lower back was tender to palpation bilaterally at the midline. Her back had almost 0 range of motion with pain in all directions. Straight leg raising was negative. Neuro:  Normal muscle strength, sensations and DTRs. Extremities: Pedal pulses were full, there was no edema. Skin:  Clear, warm and dry.  No rash.   [facility]   Given Toradol 60 mg IM.    Assessment   The encounter diagnosis was Lumbar strain.  Plan     1.  Meds:  The following meds were prescribed:   Discharge Medication List as of 03/30/2014  7:02 PM    START taking these medications   Details  !! cyclobenzaprine (FLEXERIL) 5 MG tablet Take 1 tablet (5 mg total) by mouth 3 (three) times daily as needed for muscle spasms., Starting 03/30/2014, Until Discontinued, Normal    meloxicam (MOBIC) 15 MG tablet Take 1 tablet (15 mg total) by mouth daily., Starting 03/30/2014, Until Discontinued, Normal    oxyCODONE-acetaminophen (PERCOCET) 5-325 MG per tablet 1 to 2 tablets every 6 hours as needed for pain., Print     !! - Potential duplicate medications found. Please discuss with provider.      2.  Patient Education/Counseling:  The patient was given appropriate handouts, self care instructions, and instructed in symptomatic relief. The patient was encouraged to try to be as active as possible and given some exercises to do followed by moist heat. No work until released by occupational medicine. This is a workers comp case.  3.  Follow up:  The patient was told to follow up here if no better in 3 to 4 days, or sooner if becoming worse in any way, and given some red flag symptoms such as worsening pain or new neurological symptoms which would prompt immediate return.  Follow up with occupational medicine in 2 days.     Harden Mo, MD 03/30/14 [phone]

## 2014-03-30 NOTE — Discharge Instructions (Signed)
Do exercises twice daily followed by moist heat for 15 minutes. ° ° ° ° ° °Try to be as active as possible. ° °If no better in 2 weeks, follow up with orthopedist. ° ° °

## 2014-03-30 NOTE — ED Notes (Signed)
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

## 2014-04-01 ENCOUNTER — Ambulatory Visit: Payer: 59

## 2014-04-14 ENCOUNTER — Ambulatory Visit (INDEPENDENT_AMBULATORY_CARE_PROVIDER_SITE_OTHER): Payer: 59

## 2014-04-14 VITALS — BP 113/70 | HR 100 | Resp 18

## 2014-04-14 DIAGNOSIS — M79609 Pain in unspecified limb: Secondary | ICD-10-CM

## 2014-04-14 DIAGNOSIS — L97509 Non-pressure chronic ulcer of other part of unspecified foot with unspecified severity: Secondary | ICD-10-CM

## 2014-04-14 DIAGNOSIS — E1140 Type 2 diabetes mellitus with diabetic neuropathy, unspecified: Secondary | ICD-10-CM

## 2014-04-14 NOTE — Patient Instructions (Signed)
ANTIBACTERIAL SOAP INSTRUCTIONS  THE DAY AFTER PROCEDURE  Please follow the instructions your doctor has marked.   Shower as usual. Before getting out, place a drop of antibacterial liquid soap (Dial) on a wet, clean washcloth.  Gently wipe washcloth over affected area.  Afterward, rinse the area with warm water.  Blot the area dry with a soft cloth and cover with antibiotic ointment (neosporin, polysporin, bacitracin) and band aid or gauze and tape  Place 3-4 drops of antibacterial liquid soap in a quart of warm tap water.  Submerge foot into water for 20 minutes.  If bandage was applied after your procedure, leave on to allow for easy lift off, then remove and continue with soak for the remaining time.  Next, blot area dry with a soft cloth and cover with a bandage.  Apply other medications as directed by your doctor, such as cortisporin otic solution (eardrops) or neosporin antibiotic ointment  Continued cleansing daily and applying Silvadene and gauze dressing as instructed

## 2014-04-14 NOTE — Progress Notes (Signed)
   Subjective:    Patient ID: [name], female    DOB: [birth year], 48 y.o.   MRN: [id number]  HPI I hurt my back at work and my toe looks bad and there is no draining on my left big toe    Review of Systems no systemic changes or findings noted    Objective:   Physical Exam No new changes or findings patient does have intact vascular status DP +2/4 PT one over 4 recently did injure her back in last active is wearing her Darco shoe the left hallux IP joint does have a hemorrhage a keratoses with ulceration to a pinpoint ulcer spots overall a centimeter in half and maceration with the proximal the half centimeter ulceration central portion plantar medial IP joint of the left great toe. No pain or symptomology profound neuropathy there is no apparent discharge drainage no malodor relatively superficial ulceration of the dermal subdermal junction only. No other changes in health or status or medications at this time has been applying Lac-Hydrin cream and with good success improvement we'll continue to do so       Assessment & Plan:  Assessment this time diabetic neuropathic ulcer down and dermal level ulcer is debrided Silvadene gauze dressing applied recheck in 2-3 for further followup continued lotion for the remaining skin areas as instructed  [name] DPM

## 2014-04-20 ENCOUNTER — Ambulatory Visit: Payer: PRIVATE HEALTH INSURANCE | Attending: Family Medicine

## 2014-04-20 DIAGNOSIS — M545 Low back pain, unspecified: Secondary | ICD-10-CM | POA: Insufficient documentation

## 2014-04-20 DIAGNOSIS — M6281 Muscle weakness (generalized): Secondary | ICD-10-CM | POA: Diagnosis not present

## 2014-04-20 DIAGNOSIS — IMO0001 Reserved for inherently not codable concepts without codable children: Secondary | ICD-10-CM | POA: Insufficient documentation

## 2014-04-28 ENCOUNTER — Ambulatory Visit (INDEPENDENT_AMBULATORY_CARE_PROVIDER_SITE_OTHER): Payer: 59

## 2014-04-28 ENCOUNTER — Ambulatory Visit: Payer: PRIVATE HEALTH INSURANCE | Attending: Internal Medicine | Admitting: Physical Therapy

## 2014-04-28 VITALS — BP 113/75 | HR 102 | Resp 18

## 2014-04-28 DIAGNOSIS — M545 Low back pain, unspecified: Secondary | ICD-10-CM | POA: Diagnosis not present

## 2014-04-28 DIAGNOSIS — IMO0001 Reserved for inherently not codable concepts without codable children: Secondary | ICD-10-CM | POA: Insufficient documentation

## 2014-04-28 DIAGNOSIS — E1140 Type 2 diabetes mellitus with diabetic neuropathy, unspecified: Secondary | ICD-10-CM

## 2014-04-28 DIAGNOSIS — M6281 Muscle weakness (generalized): Secondary | ICD-10-CM | POA: Insufficient documentation

## 2014-04-28 DIAGNOSIS — L97509 Non-pressure chronic ulcer of other part of unspecified foot with unspecified severity: Secondary | ICD-10-CM

## 2014-04-28 DIAGNOSIS — E1149 Type 2 diabetes mellitus with other diabetic neurological complication: Secondary | ICD-10-CM

## 2014-04-28 DIAGNOSIS — L03119 Cellulitis of unspecified part of limb: Secondary | ICD-10-CM

## 2014-04-28 DIAGNOSIS — L02619 Cutaneous abscess of unspecified foot: Secondary | ICD-10-CM

## 2014-04-28 MED ORDER — CLINDAMYCIN HCL 300 MG PO CAPS: 300.0000 mg | ORAL_CAPSULE | Freq: Three times a day (TID) | ORAL | Status: DC

## 2014-04-28 NOTE — Patient Instructions (Signed)
ANTIBACTERIAL SOAP INSTRUCTIONS  THE DAY AFTER PROCEDURE  Please follow the instructions your doctor has marked.   Shower as usual. Before getting out, place a drop of antibacterial liquid soap (Dial) on a wet, clean washcloth.  Gently wipe washcloth over affected area.  Afterward, rinse the area with warm water.  Blot the area dry with a soft cloth and cover with antibiotic ointment (neosporin, polysporin, bacitracin) and band aid or gauze and tape  Place 3-4 drops of antibacterial liquid soap in a quart of warm tap water.  Submerge foot into water for 20 minutes.  If bandage was applied after your procedure, leave on to allow for easy lift off, then remove and continue with soak for the remaining time.  Next, blot area dry with a soft cloth and cover with a bandage.  Apply other medications as directed by your doctor, such as cortisporin otic solution (eardrops) or neosporin antibiotic ointment  After soaking dried thoroughly apply Silvadene gauze wrap to the left great toe ulcer

## 2014-04-28 NOTE — Progress Notes (Signed)
   Subjective:    Patient ID: [name], female    DOB: 1966/06/27, 48 y.o.   MRN: [id number]  HPI It has been acting up again and now it goes up my leg and is red color on my big toe on left great toe and is draining    Review of Systems no new systemic changes or findings noted     Objective:   Physical Exam  neurovascular status is intact and unchanged pedal pulses palpable DP postal for PT plus one over 4 bilateral Refill time 3 seconds there is a blistered or fissure skin lesion plantar hallux IP joint left great toe with some mild serous and blood discharge drainage patient cases started hurting about 2 days ago she is also having some redness and tenderness in her anterior left shin in the past is an issue cellulitis affecting her left leg and initial hallmarks were pain prior to any edema erythema or cellulitis developing. As a precaution patient placed back on a regimen of antibiotic clindamycin 100 mg 3 times a day x10 days. The ulcer site is debrided this time down to some pinpoint bleeding to with lumicain Silvadene and gauze dressing wrapped maintain Silvadene gauze wraps daily as instructed recheck in one to 2 weeks for followup and reevaluation contact us immediately if there's any exacerbations fever chills malodor root or worsening of the wound.       Assessment & Plan:  Assessment this time his diabetes with profound peripheral neuropathy recurrent ulceration of the left hallux ulcer or possibly early cellulitis of the hallux is identified at this time down to the dermal subdermal junction small and a half centimeter ulceration down to subcutaneous tissue level hallux IP joint of the ulcer is debrided Silvadene gauze dressing applied following lumicain treatment patient will recheck in 2 weeks for followup and reevaluation prescription for clindamycin given x10 days 150 mg  [name] DPM

## 2014-04-29 ENCOUNTER — Ambulatory Visit: Payer: PRIVATE HEALTH INSURANCE | Admitting: Rehabilitation

## 2014-04-29 DIAGNOSIS — IMO0001 Reserved for inherently not codable concepts without codable children: Secondary | ICD-10-CM | POA: Diagnosis not present

## 2014-05-05 ENCOUNTER — Ambulatory Visit: Payer: PRIVATE HEALTH INSURANCE | Admitting: Physical Therapy

## 2014-05-05 DIAGNOSIS — IMO0001 Reserved for inherently not codable concepts without codable children: Secondary | ICD-10-CM | POA: Diagnosis not present

## 2014-05-06 ENCOUNTER — Encounter: Payer: Self-pay | Admitting: Physical Therapy

## 2014-05-12 ENCOUNTER — Ambulatory Visit (INDEPENDENT_AMBULATORY_CARE_PROVIDER_SITE_OTHER): Payer: 59

## 2014-05-12 VITALS — BP 138/87 | HR 92 | Resp 18

## 2014-05-12 DIAGNOSIS — M79609 Pain in unspecified limb: Secondary | ICD-10-CM

## 2014-05-12 DIAGNOSIS — L97509 Non-pressure chronic ulcer of other part of unspecified foot with unspecified severity: Secondary | ICD-10-CM

## 2014-05-12 DIAGNOSIS — E1140 Type 2 diabetes mellitus with diabetic neuropathy, unspecified: Secondary | ICD-10-CM

## 2014-05-12 DIAGNOSIS — E1149 Type 2 diabetes mellitus with other diabetic neurological complication: Secondary | ICD-10-CM

## 2014-05-12 DIAGNOSIS — IMO0002 Reserved for concepts with insufficient information to code with codable children: Secondary | ICD-10-CM

## 2014-05-12 NOTE — Patient Instructions (Signed)
ANTIBACTERIAL SOAP INSTRUCTIONS  THE DAY AFTER PROCEDURE  Please follow the instructions your doctor has marked.   Shower as usual. Before getting out, place a drop of antibacterial liquid soap (Dial) on a wet, clean washcloth.  Gently wipe washcloth over affected area.  Afterward, rinse the area with warm water.  Blot the area dry with a soft cloth and cover with antibiotic ointment (neosporin, polysporin, bacitracin) and band aid or gauze and tape  Place 3-4 drops of antibacterial liquid soap in a quart of warm tap water.  Submerge foot into water for 20 minutes.  If bandage was applied after your procedure, leave on to allow for easy lift off, then remove and continue with soak for the remaining time.  Next, blot area dry with a soft cloth and cover with a bandage.  Apply other medications as directed by your doctor, such as cortisporin otic solution (eardrops) or neosporin antibiotic ointment  Cleanse the ulcer or wound site with antibacterial soap and warm water dried thoroughly apply Silvadene and gauze dressing as instructed

## 2014-05-12 NOTE — Progress Notes (Deleted)
° °  Subjective:  ° ° Patient ID: [name], female    DOB: 04/30/1966, 47 y.o.   MRN: [id number] ° °HPI ° ° ° °Review of Systems ° °   °Objective:  ° Physical Exam ° ° ° ° °   °Assessment & Plan:  ° °

## 2014-05-12 NOTE — Progress Notes (Signed)
   Subjective:    Patient ID: [name], female    DOB: February 14, 1966, 48 y.o.   MRN: [id number]  HPI I have a new blister on the inside of the left foot and has been there since monday and my big toe on my left foot is no better looks worse     Review of Systems no new systemic changes or findings to     Objective:   Physical Exam Neurovascular status is intact with pedal pulses palpable and unchanged since last visit DP and PT palpable pulses bilateral epicritic and proprioceptive sensations diminished on [employer] testing confirmed to the left forefoot there is new blister over the medial talonavicular area on the left foot posse since he was wearing the boot her Cam Walker may cause irritation from the strap of the half centimeter diameter blister which is not opened no secondary infection noted patient case with dark however there is no hyperpigmentation no hemorrhage identified no discharge or drainage. You sterile Kling blister over the talonavicular joint. The hallux of left foot continues to have ulceration proximally 0.5 cm x 0.8 cm ulceration with some surrounding hemorrhage a keratoses and maceration and does appear to be any proud discharge drainage no malodor noted no ascending cellulitis or lymphangitis. It clean and dry ulcer which seems because they're consolidating slowly at this time some hemorrhage a keratosis surrounding the periphery the ulcer is debrided patient completed her antibiotics last couple days of clindamycin left with best to complete her in a bike regimen as instructed and monitor for any changes any recurrence or exacerbations contact us immediately       Assessment & Plan:  Assessment new blister left foot a medial talonavicular area will maintain Neosporin her Silvadene and gauze bandage to protect the area and avoid pressure on the navicular bone.  Assessment #2 is continued ulceration of left hallux appears to be stable and closings down  to 0.5 ice or funny in diameter down to subcutaneous dermal junction mild serous drainage is noted no malodor no secondary infection Silvadene gauze dressings applied and will continue change daily as instructed with cleansing with soap and water recheck in 2-3 weeks for further followup maintain moderate activities whenever possible contact us if any exacerbation occurs  [name] DPM

## 2014-05-17 ENCOUNTER — Ambulatory Visit: Payer: PRIVATE HEALTH INSURANCE | Attending: Internal Medicine

## 2014-05-17 DIAGNOSIS — IMO0001 Reserved for inherently not codable concepts without codable children: Secondary | ICD-10-CM | POA: Insufficient documentation

## 2014-05-17 DIAGNOSIS — M6281 Muscle weakness (generalized): Secondary | ICD-10-CM | POA: Insufficient documentation

## 2014-05-17 DIAGNOSIS — M545 Low back pain, unspecified: Secondary | ICD-10-CM | POA: Insufficient documentation

## 2014-05-19 ENCOUNTER — Ambulatory Visit: Payer: PRIVATE HEALTH INSURANCE

## 2014-05-20 ENCOUNTER — Other Ambulatory Visit: Payer: Self-pay

## 2014-05-24 ENCOUNTER — Ambulatory Visit: Payer: PRIVATE HEALTH INSURANCE | Admitting: Rehabilitation

## 2014-05-26 ENCOUNTER — Other Ambulatory Visit: Payer: Self-pay

## 2014-05-27 ENCOUNTER — Ambulatory Visit: Payer: PRIVATE HEALTH INSURANCE | Admitting: Physical Therapy

## 2014-05-27 ENCOUNTER — Ambulatory Visit (INDEPENDENT_AMBULATORY_CARE_PROVIDER_SITE_OTHER): Payer: 59

## 2014-05-27 VITALS — BP 128/84 | HR 79 | Resp 18

## 2014-05-27 DIAGNOSIS — E1140 Type 2 diabetes mellitus with diabetic neuropathy, unspecified: Secondary | ICD-10-CM

## 2014-05-27 DIAGNOSIS — L97509 Non-pressure chronic ulcer of other part of unspecified foot with unspecified severity: Secondary | ICD-10-CM

## 2014-05-27 MED ORDER — CLINDAMYCIN HCL 300 MG PO CAPS: 300.0000 mg | ORAL_CAPSULE | Freq: Three times a day (TID) | ORAL | Status: DC

## 2014-05-27 NOTE — Progress Notes (Deleted)
° °  Subjective:    Patient ID: [name], female    DOB: [birth year], 48 y.o.   MRN: [id number]  HPI    Review of Systems     Objective:   Physical Exam        Assessment & Plan:

## 2014-05-27 NOTE — Patient Instructions (Signed)
ANTIBACTERIAL SOAP INSTRUCTIONS  THE DAY AFTER PROCEDURE  Please follow the instructions your doctor has marked.   Shower as usual. Before getting out, place a drop of antibacterial liquid soap (Dial) on a wet, clean washcloth.  Gently wipe washcloth over affected area.  Afterward, rinse the area with warm water.  Blot the area dry with a soft cloth and cover with antibiotic ointment (neosporin, polysporin, bacitracin) and band aid or gauze and tape  Place 3-4 drops of antibacterial liquid soap in a quart of warm tap water.  Submerge foot into water for 20 minutes.  If bandage was applied after your procedure, leave on to allow for easy lift off, then remove and continue with soak for the remaining time.  Next, blot area dry with a soft cloth and cover with a bandage.  Apply other medications as directed by your doctor, such as cortisporin otic solution (eardrops) or neosporin antibiotic ointment  Right foot thoroughly after washing apply Silvadene and gauze dressing

## 2014-05-27 NOTE — Progress Notes (Signed)
   Subjective:    Patient ID: [name], female    DOB: May 20, 1966, 48 y.o.   MRN: [id number]  HPI It is worse and this place on the side of my left foot is no better      Review of Systems no new findings or systemic changes noted     Objective:   Physical Exam Patient presents time for followup ulcerations on the hallux IP joint left great toe distal proxy 1 cm ulceration with some surrounding maceration and hyperhidrosis is been draining more in the last couple days or week patient's left foot leg may have some increased warmth in temperature has had history of cellulitis in her left leg and the area of redness at the midshin area on the left leg is somewhat warm to touch at this time patient cases had been improving when she was on clindamycin and she finished that over a week ago and since that time she may if having some recurrence of the redness and in increase in drainage the last couple days. The ulcer site shows no malodor there is no ascending cellulitis or lymphangitis noted at this time patient has not had any fevers or chills no other changes in activity is noted patient wearing appropriate coming shoes also continues to have irritation over the medial navicular tuberosity dispensed some doughnut pads or Shields to protect the area from rubbing against her shoes.       Assessment & Plan:  Assessment this time his diabetes with history peripheral neuropathy ulceration left great toe the ulcer is cleansed with all cleansed Iodosorb and gauze dressing applied to help with drying the area will resume Silvadene and gauze dressings daily and new prescription for clindamycin 300 mg 3 times a day x10 days is issued at this time recheck in 2 weeks for followup contact us visiting changes or exacerbations in the interim with Silvadene gauze dressings daily as instructed  [name] DPM

## 2014-05-31 ENCOUNTER — Ambulatory Visit: Payer: PRIVATE HEALTH INSURANCE | Admitting: Physical Therapy

## 2014-06-02 ENCOUNTER — Encounter: Payer: Self-pay | Admitting: Physical Therapy

## 2014-06-07 ENCOUNTER — Ambulatory Visit: Payer: PRIVATE HEALTH INSURANCE

## 2014-06-08 ENCOUNTER — Ambulatory Visit: Payer: PRIVATE HEALTH INSURANCE | Admitting: Rehabilitation

## 2014-06-10 ENCOUNTER — Ambulatory Visit: Payer: PRIVATE HEALTH INSURANCE | Attending: Internal Medicine | Admitting: Rehabilitation

## 2014-06-10 ENCOUNTER — Ambulatory Visit: Payer: 59

## 2014-06-10 DIAGNOSIS — M545 Low back pain, unspecified: Secondary | ICD-10-CM | POA: Insufficient documentation

## 2014-06-10 DIAGNOSIS — M6281 Muscle weakness (generalized): Secondary | ICD-10-CM | POA: Insufficient documentation

## 2014-06-10 DIAGNOSIS — IMO0001 Reserved for inherently not codable concepts without codable children: Secondary | ICD-10-CM | POA: Insufficient documentation

## 2014-06-15 ENCOUNTER — Ambulatory Visit (INDEPENDENT_AMBULATORY_CARE_PROVIDER_SITE_OTHER): Payer: Self-pay | Admitting: Family Medicine

## 2014-06-15 VITALS — BP 138/86 | Wt 262.0 lb

## 2014-06-15 DIAGNOSIS — E118 Type 2 diabetes mellitus with unspecified complications: Secondary | ICD-10-CM

## 2014-06-16 ENCOUNTER — Ambulatory Visit: Payer: PRIVATE HEALTH INSURANCE

## 2014-06-16 NOTE — Progress Notes (Signed)
Patient presents today for 3 month diabetes follow-up as part of the employer-sponsored [employer] program. Current diabetes regimen includes Humulin U-500 via pump, Metformin, and Invokana (added at last endo visit). Patient also continues on daily ASA, ACEi, and statin. MD would like to switch pt to Invokamet with coupon card to reduce pill burden. Patient will finish current supply of Invokana and Metformin and will then consider this switch. Most recent MD follow-up was today, July 2015 with Dr. Forde Dandy, endo. Patient has a pending appt for follow-up with Dr. Forde Dandy. Patient started Invokana per Dr. Forde Dandy in June and is tolerating well. Patient has now transitioned to endo care with Dr. Forde Dandy. Patient also recently started a new medtronic pump and CGM, issued by Dr. Elyse Hsu.   Paitent continues experiencing the same skin reaction, presenting as red welps and occur randomly, lasting from several minutes to several hours. No itching or pain associated, only redness. Dr. Forde Dandy recommended she have rheumatologist look at this as it may be an autoimmune reaction. She has appt schedule with Dr. Ouida Sills in August. Pt has upcoming appts with Dr. Jimmy Footman (nephrologist). She had recent GI appt with Dr. Lyndel Safe at which time dicyclomine was added for IBS. She is also due for colonoscopy (8 years since last).  Diabetes Assessment: Type of Diabetes: Type 2; takes an aspirin a day; takes medications as prescribed; uses glucometer; checks feet daily Pt is being monitored by a podiatrist every 2-3 weeks for recurrent ulcer of toe, and recurrent lower extremity cellulitis. ; 14 day CBG average 131; Highest CBG 377; Lowest CBG 81; MD managing Diabetes Dr. Forde Dandy, endo; checks blood glucose 3-4 times a day; A1c 7.0 (prev 8.5)  Other Diabetes History: Current med regimen includes Humulin U-500 via pump, Metformin 1,000 twice daily, and Invokana 100 mg daily. Invokana was added by Dr. Forde Dandy in June. Patient is tolerating  well and will switch to Invokamet when current Invokana supply runs out, this is to reduce pill burden. Patient also recently started on new Medtronic pump and CGM, which she enjoys using. She did have an allergic reaction to the drape covering over CGM transmitter and is working to try other coverings that she can tolerate better. Pt wears CGM sensor for about 4 days at a time. Each time a new sensor is placed, pateint is required to calibrate it by testing manual glucose at 2, 4, 8, and then q12 hours. If this is done properly, the CGM will read every 5 minutes. Pt has found the sensor helpful for detecting hypoglycemia and is also able to use sensor readings for pre-meal glucose. While wearing the sensor, she is testing only 2-3 times daily. She also has ability to utilize dual wave bolusing on new pump and finds this helpful. Patient does maintain good medication compliance. Patient did bring meter today and is now using Contour Next glucometer. Hypoglycemia frequency is rare. Patient does demonstrate appropriate correction of hypoglycemia. She continues struggling with left great toe ulcer (see explanation below). Patient is due for yearly eye exam and is scheduled for July. A1c has improved significantly as of today's appt with Dr. Forde Dandy. After being on Invokana for approximately 4 weeks, A1c has decreased from 8.5 to 7.0. Patient and MD are very pleased with improvement.  Patient recently finished clindamycin for treatment of toe ulcer. Left great toe remains open and toenail has recently fallen off, otherwise toe seems to be healing appropriately and appears healthy according to Dr. Forde Dandy. Tissue is pink and  draining. Patient continues to struggle with this and is disappointed that she will not be able to paint her toenails this summer or wear her pretty summer sandals. She is embarrassed by her toes, especially the ulcerated toe. She will follow-up with Dr. Blenda Mounts (podiatrist) soon, and needs to  reschedule appt after office had to cancel current appt.   Lifestyle Factors: Diet - Patient continues to improve diet. She has noticed a smaller appetite since starting Invokana, to the point that she sometimes forgets to eat while at work. She continues packing lunch, unless she is running behind schedule and in that case she will get chicken salad from cafeteria. If she packs lunch, she eats rev wraps, craising, jello cup, fruit (banana or red plum), almonds or walnuts. Beverages include water (sometimes with flavor pack) and one diet mtn dew per day. She is now trying to eat a green vegetable with every supper including asparagus, brocolli, brussel sprouts, etc. Patient did discover that corn causes glucose to spike with PPG up to 225 after eating corn.  Exercise - Patient has been limited in exercise due to toe ulcer. MD has instructed her to stay off feet as much as possible to encourage healing. Patient is frustrated with weight gain and says she feels much better when she is able to walk. 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 Her toe has handled this well and she wants to start mild, slow walking a few times per week. She will monitor toe closely for signs of worsened infection. She will discuss with Dr. Blenda Mounts. Also, patient recently suffered back injury on the job and has been attending physical therapy twice weekly and doing assigned back strengthening exercises 2-3 times daily while at work. She will continue this.   Assessment: Pt returns today with improved control of DM. A1c has decreased from 8.5 to 7.0. Her biggest challenge continues to be treatment of diabetic foot ulcer, but she was given a good report at today's endo visit. I am hopeful her infection will clear with improvement in diabetes management and improved A1c. At this time patient is very frustrated with her recurrent infection and limited mobility. She is being followed closely by podiatrist at this time. Patient continues  to make improvements to diet. She also wants to start mild exercise including slow pace walking in an effort at weight loss. We will follow-up in 3 months.  Plan: 1) Continue making healthy dietary choices, continue increasing vegetables 2) Attempt mild walking as much as tolerated, monitor foot closely 3) Continue testing regularly 4) Follow-up with [facility] in 3 months on Tuesday October 6th @ 8:30 am

## 2014-06-17 ENCOUNTER — Ambulatory Visit (INDEPENDENT_AMBULATORY_CARE_PROVIDER_SITE_OTHER): Payer: 59

## 2014-06-17 VITALS — BP 136/87 | HR 99 | Resp 18

## 2014-06-17 DIAGNOSIS — M79606 Pain in leg, unspecified: Secondary | ICD-10-CM

## 2014-06-17 DIAGNOSIS — E1140 Type 2 diabetes mellitus with diabetic neuropathy, unspecified: Secondary | ICD-10-CM

## 2014-06-17 DIAGNOSIS — L97509 Non-pressure chronic ulcer of other part of unspecified foot with unspecified severity: Secondary | ICD-10-CM

## 2014-06-17 NOTE — Patient Instructions (Signed)
ANTIBACTERIAL SOAP INSTRUCTIONS  THE DAY AFTER PROCEDURE  Please follow the instructions your doctor has marked.   Shower as usual. Before getting out, place a drop of antibacterial liquid soap (Dial) on a wet, clean washcloth.  Gently wipe washcloth over affected area.  Afterward, rinse the area with warm water.  Blot the area dry with a soft cloth and cover with antibiotic ointment (neosporin, polysporin, bacitracin) and band aid or gauze and tape  Place 3-4 drops of antibacterial liquid soap in a quart of warm tap water.  Submerge foot into water for 20 minutes.  If bandage was applied after your procedure, leave on to allow for easy lift off, then remove and continue with soak for the remaining time.  Next, blot area dry with a soft cloth and cover with a bandage.  Apply other medications as directed by your doctor, such as cortisporin otic solution (eardrops) or neosporin antibiotic ointment  Continue with Silvadene and dressing changes

## 2014-06-17 NOTE — Progress Notes (Signed)
   Subjective:    Patient ID: [name], female    DOB: 08-30-1966, 48 y.o.   MRN: [id number]  HPI my left big toe is doing ok and the big toenail on the right came off on Friday and there is some swelling on my left leg Patient had a recent contusion to her right great toe which developed hematoma and eventual avulsion of the nail the nailbed appears to be clean slight eschar noted no discharge or drainage of the right great toe however left hallux these have ulceration proximal the centimeter in diameter sub-hallux IP joint also a slight skin tear the nailbed of the left hallux is noted possibly due to Band-Aid irritation   Review of Systems no new findings or systemic changes noted     Objective:   Physical Exam Lower extremity objective findings unchanged pedal pulses palpable DP postal for PT +2/4 bilateral capillary fill time 3 seconds all digits epicritic and proprioceptive sensations grossly diminished on [employer] testing. Ulcer down to subcutaneous tissue with a good pink granular base however some macerated keratosis around the periphery which is debrided at this time of the left great toe mild serous drainage is noted has no increased pain no fever no chills no other systemic signs of infection has completed her antibiotic regimen appears to be clean and stable at this time. She switched over to see Dr. Forde Dandy as her endocrinologist and they've modified to adjust her medications and are going to do some additional adjustments to her medications the future her A1c is improved from an 8 down to a low 7 and hopefully will continue down into the 6 range       Assessment & Plan:  Assessment diabetes with peripheral neuropathy ulceration left great toe contusion to right hallux appears to be healing well no signs of infection noted left hallux is debrided Silvadene and gauze dressing applied recheck in 2-3 weeks for followup discuss possibilities of using a bio healing  agent such as at the fracture/skin graft substitute type product to speed the healing of this ulcer we'll obtain authorization is in the interim and if not completely resolved are healed patient may be candidate for epi-fix application at next visit  [name] DPM

## 2014-06-18 ENCOUNTER — Encounter: Payer: Self-pay | Admitting: Physical Therapy

## 2014-06-22 ENCOUNTER — Encounter: Payer: Self-pay | Admitting: Physical Therapy

## 2014-06-22 ENCOUNTER — Ambulatory Visit: Payer: PRIVATE HEALTH INSURANCE | Admitting: Rehabilitation

## 2014-06-24 ENCOUNTER — Ambulatory Visit: Payer: PRIVATE HEALTH INSURANCE | Admitting: Rehabilitation

## 2014-06-29 ENCOUNTER — Ambulatory Visit: Payer: PRIVATE HEALTH INSURANCE | Admitting: Physical Therapy

## 2014-07-01 ENCOUNTER — Ambulatory Visit (INDEPENDENT_AMBULATORY_CARE_PROVIDER_SITE_OTHER): Payer: 59 | Admitting: Podiatrist

## 2014-07-01 ENCOUNTER — Ambulatory Visit (INDEPENDENT_AMBULATORY_CARE_PROVIDER_SITE_OTHER): Payer: 59

## 2014-07-01 VITALS — BP 118/78 | HR 107 | Resp 16

## 2014-07-01 DIAGNOSIS — S92919A Unspecified fracture of unspecified toe(s), initial encounter for closed fracture: Secondary | ICD-10-CM

## 2014-07-01 DIAGNOSIS — S92912A Unspecified fracture of left toe(s), initial encounter for closed fracture: Secondary | ICD-10-CM

## 2014-07-01 DIAGNOSIS — E119 Type 2 diabetes mellitus without complications: Secondary | ICD-10-CM

## 2014-07-01 NOTE — Progress Notes (Signed)
   Subjective:  Patient presents today for a complaint of severe pain to the left foot.  She states she doesn't believe it is from the ulceration on the great toe but that she developed severe discomfort when she puts her foot down.  She states it just started today and she denies any trauma or injury to the foot.  She has a history of cellulitus and states it does not appear to her to be cellulitis.  Objective: The patient is favoring her left foot and appears to be in pain with ambulation.  Profound peripheral neuropathy is present.  And vascular status is intact.  No streaking or lymphangitis on the foot or up the leg is noted.  Ulceration is present but not infected left hallux.  It appears stable at 2cm in diameter with a red, granular base.  No pin point area of pain is noted.  Generalized pain and discomfort with weight bearing is subjectively noted.    xrays reveal what appears to be a fracture of the proximal phalanx of the left hallux and distal portion of the proximal phalanx of the 2nd toe.  Possible fracture but not definitive is on the 5th metatarsal neck as well.  Assessment:  Concern for new onset fractures left foot in the absence of injury and presence of diabetes-- ? Early charcot?  Plan:  Dressed the toe ulceration with medihoney and a dressing and dispensed a tube of medihoney for her to use on the ulceration to see how it does.  A short air fracture walker was dispensed and she was instructed to do thorough skin inspections to make sure the boot is not rubbing her skin and causing any type of pressure injury.  She is instructed to wear a thick heavy sock with the boot to prevent injury.  We will order an MRI to figure out the extent of the foot injury and to assess for fractures.  At this time no antibiotics are necessary.  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  I will see her back on Tuesday to make sure the foot is improving over the weekend.

## 2014-07-02 ENCOUNTER — Telehealth: Payer: Self-pay | Admitting: *Deleted

## 2014-07-02 DIAGNOSIS — S92912A Unspecified fracture of left toe(s), initial encounter for closed fracture: Secondary | ICD-10-CM

## 2014-07-02 DIAGNOSIS — E11610 Type 2 diabetes mellitus with diabetic neuropathic arthropathy: Secondary | ICD-10-CM

## 2014-07-02 NOTE — Telephone Encounter (Signed)
Order was put in for MRI and faxed to [place].

## 2014-07-02 NOTE — Telephone Encounter (Signed)
Message copied by Lolita Rieger on Fri Jul 02, 2014  1:31 PM ------      Message from: Bronson Ing      Created: Thu Jul 01, 2014  3:01 PM      Regarding: mri       Can you order an MRI for this patient-- left foot.  ? Fracture of hallux and 2nd toe and 5th met?  ? Charcot changes ------

## 2014-07-05 ENCOUNTER — Ambulatory Visit: Payer: PRIVATE HEALTH INSURANCE | Admitting: Rehabilitation

## 2014-07-07 ENCOUNTER — Other Ambulatory Visit: Payer: Self-pay

## 2014-07-07 ENCOUNTER — Ambulatory Visit
Admission: RE | Admit: 2014-07-07 | Discharge: 2014-07-07 | Disposition: A | Discharge status: Home / Self Care | Payer: 59 | Source: Ambulatory Visit | Attending: Podiatrist | Admitting: Podiatrist

## 2014-07-07 DIAGNOSIS — S92912A Unspecified fracture of left toe(s), initial encounter for closed fracture: Secondary | ICD-10-CM

## 2014-07-07 DIAGNOSIS — E11610 Type 2 diabetes mellitus with diabetic neuropathic arthropathy: Secondary | ICD-10-CM

## 2014-07-08 ENCOUNTER — Ambulatory Visit (INDEPENDENT_AMBULATORY_CARE_PROVIDER_SITE_OTHER): Payer: 59

## 2014-07-08 VITALS — BP 117/72 | HR 93 | Resp 18

## 2014-07-08 DIAGNOSIS — E1140 Type 2 diabetes mellitus with diabetic neuropathy, unspecified: Secondary | ICD-10-CM

## 2014-07-08 DIAGNOSIS — M79606 Pain in leg, unspecified: Secondary | ICD-10-CM

## 2014-07-08 DIAGNOSIS — L97509 Non-pressure chronic ulcer of other part of unspecified foot with unspecified severity: Secondary | ICD-10-CM

## 2014-07-08 DIAGNOSIS — M109 Gout, unspecified: Secondary | ICD-10-CM

## 2014-07-08 DIAGNOSIS — M10072 Idiopathic gout, left ankle and foot: Secondary | ICD-10-CM

## 2014-07-08 DIAGNOSIS — E11610 Type 2 diabetes mellitus with diabetic neuropathic arthropathy: Secondary | ICD-10-CM

## 2014-07-08 DIAGNOSIS — M79609 Pain in unspecified limb: Secondary | ICD-10-CM

## 2014-07-08 NOTE — Progress Notes (Signed)
   Subjective:    Patient ID: [name], female    DOB: [birth year], 48 y.o.   MRN: [id number]  HPI  I AM FEELING A LOT BETTER WITH THE LEFT FOOT AND THE BOOT HAS HELPED A LOT BUT IT STILL HURTS SOME TO PUT WEIGHT ON IT    Review of Systems no new findings or systemic changes noted     Objective:   Physical Exam 48 year old white female well-developed considerably overweight oriented x3 presents this time for followup of pain possible stress fractures of the phalanges of the hallux and second toe were noted on previous x-rays and MRI no acute fractures were identified on those x-rays however also cannot rule out Charcot arthropathy or changes. Indicates her foot got painful and she was sleeping walker and heard in the foot ankle and lower leg. It was red for a while the lump no redness no increased temperature currently that seems to resolve patient cases been feeling better since she's been the fracture boot. Lower extremity objective findings unchanged pedal pulses palpable DP postal for PT one over 4 bilateral Refill time 3 seconds all digits epicritic sensations grossly diminished on [employer] testing there is normal plantar response DTRs not elicited dermatologically skin color pigment normal hair growth absent results are about half by 1 cm ulceration hallux IP joint left great toe with some mild keratoses no maceration no purulence no discharge drainage no ascending cellulitis noted MRI ruled out no signs of osteomyelitis were noted diffuse muscle atrophy of fat atrophy of the muscle was noted and mild peripheral edema was noted. Patient had blood work done recently with her primary physician Dr. Alfonse Ras has not had an arthritis panel possible uric acid level was checked      Assessment & Plan:  Assessment possible fractures of the phalanges of the left foot secondary osteopenia and diabetes with peripheral neuropathy is may be an early Charcot-type change cannot be  ruled out however also need to consider the possibility of a gouty attack or gouty arthropathy. At this time continues to have ulceration which is debrided times dermal subcutaneous junction Silvadene and gauze dressing reapplied maintain Silvadene gauze dressing daily patient indicates the boot as a been causing irritation to her anterior shin and we substituted boot from a low boot to high flow BK boot. Maintain air fracture boot for the next 3-4 weeks followup in 3 weeks for further ulcer care and reevaluation. At this time also did provide patient with the lab request for arthritis panel including uric acid levels. Recheck in 3 weeks for followup

## 2014-07-08 NOTE — Patient Instructions (Signed)

## 2014-07-09 ENCOUNTER — Ambulatory Visit: Payer: PRIVATE HEALTH INSURANCE | Admitting: Physical Therapy

## 2014-07-09 LAB — ARTHRITIS PANEL: Anti Nuclear Antibody(ANA): NEGATIVE

## 2014-07-12 ENCOUNTER — Encounter: Payer: Self-pay | Admitting: Family Medicine

## 2014-07-12 NOTE — Progress Notes (Signed)
Patient ID: [name], female   DOB: [birth year], 48 y.o.   MRN: [id number] Reviewed: Agree with our Pharmacologist's documentation and management.

## 2014-07-12 NOTE — Progress Notes (Signed)
Patient ID: [name], female   DOB: 14-Mar-1966, 48 y.o.   MRN: [id number] Reviewed: Agree with our Pharmacologist's documentation and management.

## 2014-07-14 NOTE — Progress Notes (Signed)
Called patient and left a message on cell phone and stated that her uric acid was elevated and that dr Blenda Mounts wanted to keep you on the gout diet and if any changes or no improvement to call our office and to let your medical doctor know as well. lisa

## 2014-07-29 ENCOUNTER — Ambulatory Visit (INDEPENDENT_AMBULATORY_CARE_PROVIDER_SITE_OTHER): Payer: 59

## 2014-07-29 VITALS — BP 127/100 | HR 78 | Resp 18

## 2014-07-29 DIAGNOSIS — E1142 Type 2 diabetes mellitus with diabetic polyneuropathy: Secondary | ICD-10-CM

## 2014-07-29 DIAGNOSIS — L97509 Non-pressure chronic ulcer of other part of unspecified foot with unspecified severity: Secondary | ICD-10-CM

## 2014-07-29 DIAGNOSIS — E1140 Type 2 diabetes mellitus with diabetic neuropathy, unspecified: Secondary | ICD-10-CM

## 2014-07-29 DIAGNOSIS — E1149 Type 2 diabetes mellitus with other diabetic neurological complication: Secondary | ICD-10-CM

## 2014-07-29 NOTE — Progress Notes (Signed)
   Subjective:    Patient ID: [name], female    DOB: [birth year], 48 y.o.   MRN: [id number]  HPI I AM DOING BETTER ON MY LEFT BIG TOE AND I HAVE BEEN USING ROSE BUD CREAM ON IT    Review of Systems no new findings or systemic changes noted     Objective:   Physical Exam Lower extremity objective findings as follows vascular status is intact pedal pulses palpable DP +2/4 bilateral PT one over 4. Capillary refill time 3 seconds notable neuropathy distal forefoot digits the ulcer of left great toe is greatly improved is actually 2 small pinpoint areas of about a millimeter down to dermal level only of hemorrhage a keratoses there is new epithelialization of the majority the ulcer began to 1 mm pinpoint areas at the IP joint of the left great toe noted no pain or discomfort also recently bumped her right great toe against the bed and Dr. nail off a slight laceration the right hallux nail bed which is treated with peroxide Silvadene gauze dressing being applied at this time. The ulcer is debrided again down to dermal level only and Silvadene and gauze dressing reapplied and we maintained for at least a couple of weeks. Remainder of exam unremarkable patient has been wearing the boot up until today she takes first day she went without the air fracture walker for possible fracture of phalanges on her toes on the theater been feeling much better no pain no discomfort on range of motion palpation or shoe wear she can return to work without her BK boot at this time fractures or contusion the toes have resolved at this time suggested return to regular work activities as previously left.       Assessment & Plan:  Assessment improving resolving ulcer left great toe done to the 21 mm spots dermal level these are debrided Silvadene and gauze dressing reapplied. Recheck in 3-4 weeks for further followup and ulcer check also at this time discontinue the PT fracture boot and Silvadene gauze dressing  applied to the laceration the right hallux nail bed monitor for any changes or exacerbations no new signs of infection no cellulitis no other complaints patient appears to be functioning better maintain crocs around the house not going barefoot or wearing flip-flop reevaluate in 4 weeks  [name] DPM

## 2014-07-29 NOTE — Patient Instructions (Signed)
ANTIBACTERIAL SOAP INSTRUCTIONS  THE DAY AFTER PROCEDURE  Please follow the instructions your doctor has marked.   Shower as usual. Before getting out, place a drop of antibacterial liquid soap (Dial) on a wet, clean washcloth.  Gently wipe washcloth over affected area.  Afterward, rinse the area with warm water.  Blot the area dry with a soft cloth and cover with antibiotic ointment (neosporin, polysporin, bacitracin) and band aid or gauze and tape  Place 3-4 drops of antibacterial liquid soap in a quart of warm tap water.  Submerge foot into water for 20 minutes.  If bandage was applied after your procedure, leave on to allow for easy lift off, then remove and continue with soak for the remaining time.  Next, blot area dry with a soft cloth and cover with a bandage.  Apply other medications as directed by your doctor, such as cortisporin otic solution (eardrops) or neosporin antibiotic ointment  Wash with soap and water as instructed dried thoroughly apply Silvadene and gauze dressing daily

## 2014-08-05 ENCOUNTER — Encounter: Payer: Self-pay | Admitting: Family Medicine

## 2014-08-05 NOTE — Progress Notes (Signed)
Patient ID: [name], female   DOB: 05-03-1966, 48 y.o.   MRN: [id number] Reviewed: Agree with the documentation and management by [facility].

## 2014-10-14 ENCOUNTER — Ambulatory Visit (INDEPENDENT_AMBULATORY_CARE_PROVIDER_SITE_OTHER): Payer: Self-pay | Admitting: Family Medicine

## 2014-10-14 VITALS — BP 122/86 | Wt 262.0 lb

## 2014-10-14 DIAGNOSIS — E118 Type 2 diabetes mellitus with unspecified complications: Secondary | ICD-10-CM

## 2014-10-20 NOTE — Progress Notes (Signed)
Patient presents today for 3 month diabetes follow-up as part of the employer-sponsored [employer] program. Current diabetes regimen includes Humulin U-500 via pump, and Invokamet. Patient also continues on daily ASA, ACEi, and statin. Most recent MD follow-up was Sept 2015 with Dr. Forde Dandy, endo. Patient has a pending appt for follow-up with Dr. Forde Dandy. No med changes or pump setting changes at last endo appt. Dr. Forde Dandy did order a thyroid ultrasound revealing nodules of various sizes according to patient but MD has not discussed results with patient yet. Also, of note, patient has recently suffered a viral flare presenting as severe fatigue, nausea/vomiting, etc and lasting approximately 5-7 days. These flares occur 2-3 times per year and are of unknown origin. Symptoms have now resolved and patient is back to normal.  Left great toe ulcer has healed and is no longer open or draining. Toenail has not returned, but patient is aware this will take several more months to determine if toenail will regrow. She is able to exercise again and has had no antibiotic courses since June. Patient will follow-up with Dr. Blenda Mounts in late November.  Patient is responding well to dicyclomine, last prescribed by Dr. Lyndel Safe. Dr. Lyndel Safe has ordered a routine colonoscopy, although patient has not yet scheduled appt but plans to soon.  Patient continues being followed by Dr. Ouida Sills for fibromyalgia and unknown rash/hives. No answers at this time regarding rash and hives, MD believes it is an unknown histamine response and does not believe it is inflammatory given normal sed rate. Patient has been taking low dose naltrexone compound and feels it has helped reduce fatigue and helps her improve sleep patterns.  Diabetes Assessment: Type of Diabetes: Type 2; Sees Diabetes provider 3 times per year; takes an aspirin a day; takes medications as prescribed; uses glucometer; checks feet daily Pt is being monitored by a podiatrist  every 2-3 weeks for recurrent ulcer of toe, and recurrent lower extremity cellulitis. ; 14 day CBG average 131; MD managing Diabetes Dr. Forde Dandy, endo; checks blood glucose 3-4 times a day; Highest CBG 255; Lowest CBG 47 Other Diabetes History: Current med regimen includes Humulin U-500 via pump and Invokamet 50/1000 mg twice daily. Patient was previously taking individual Invokana and Metformin but wanted to reduce number of pills per day so will now switch to Invokamet. She has been provided samples by her MD but will get a prescription once samples run out. Patient continues on [employer] pump. She was using CGM but is no longer doing so (except on occasion) due to allergic reaction to the drape covering over CGM transmitter. Pt has found the sensor helpful for detecting hypoglycemia and would like to use it regularly again once she finds an adhesive covering that does not cause skin irritation. She will contact [employer] to inquire. While not wearing the sensor, she is testing 3-4 times daily. Per patient report glucose is generally low 100s, with highest reading of 225. Pt did not bring meter today and is still using [employer]. She has had two hypoglycemic episodes recently, both occurring at night (1:30 pm - 47, and 2:45 - 52), these were easily corrected with juice. Patient does maintain good medication compliance. Patient is not due for yearly eye exam and had one recently in July 2015. A1c remains improved and was 7.0 at Dr. Baldwin Crown follow-up in Sept.   Lifestyle Factors: Diet - Appetite remains the same but patient has noticed she is full much faster, especially after starting daily low dose naltrexone  for fibromyalgia. She is attempting to maintain healthy snack choices including apple sauce, greek yogurt, apple slices, ham and cheese slices, almonds. She continues eating at least one serving of veg each night at supper. Beverages include water (sometimes with flavor pack) and one diet mtn dew per  day.  Exercise - Patient is now exercising several times per week since toe ulcer has healed. She walks the [employer] walking path two laps three times per week with husband. 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   Assessment: Pt returns today under good glycemic control with A1c of 7.0. Diabetic foot ulcer has resolved after several months of treatment and patient has been able to resume exercise. Patient continues to struggle with fibromyalgia and fatigue and is being treated for this. She continues being followed by several specialists. Patient continues to make improvements to diet. We will follow-up in 3 months.  Plan: 1) Continue making healthy dietary choices, continue increasing vegetables 2) Continue walking regularly, at least three times per week 3) Weight loss goal of 4 lbs over next three months 3) Continue testing regularly 4) Follow-up with [facility] in 3 months on Tues February 2nd @ 8:30 am

## 2014-11-11 ENCOUNTER — Ambulatory Visit (INDEPENDENT_AMBULATORY_CARE_PROVIDER_SITE_OTHER): Payer: 59

## 2014-11-11 VITALS — BP 129/75 | HR 102 | Resp 18

## 2014-11-11 DIAGNOSIS — M79606 Pain in leg, unspecified: Secondary | ICD-10-CM

## 2014-11-11 DIAGNOSIS — L97521 Non-pressure chronic ulcer of other part of left foot limited to breakdown of skin: Secondary | ICD-10-CM

## 2014-11-11 DIAGNOSIS — E11610 Type 2 diabetes mellitus with diabetic neuropathic arthropathy: Secondary | ICD-10-CM

## 2014-11-11 DIAGNOSIS — E1140 Type 2 diabetes mellitus with diabetic neuropathy, unspecified: Secondary | ICD-10-CM

## 2014-11-11 MED ORDER — CLINDAMYCIN HCL 300 MG PO CAPS: 300.0000 mg | ORAL_CAPSULE | Freq: Three times a day (TID) | ORAL | Status: DC

## 2014-11-11 NOTE — Progress Notes (Signed)
   Subjective:    Patient ID: [name], female    DOB: 02-23-1966, 48 y.o.   MRN: [id number]  HPI MY LEFT BIG TOE IS PURPLE COLOR AND HURTS AND CAME UP YESTERDAY AND THERE IS NO DRAINING    Review of Systems no new findings or systemic changes noted     Objective:   Physical Exam 48 year old female since this time is nearly 4 months since she was last seen does have history of diabetes with history of chronic ulceration of the left great toe this time having pain in the left toe and foot for foot dorsal the foot with aching sensation patient is developed appears to be like a blood blood blister distal tuft of the left hallux proximally 3 x 8 mm in overall diameter physical down to the dermal subdermal junction partial-thickness skin only does not go down to fat or subcutaneous tissue. There is localized erythema of the toe only ascending psoas lymphangitis no fever or chills no other systemic signs of infection however patient has had his episodes of cellulitis extending from the hallux cannot rule out a recent contusion or bump on the toe which is really exacerbated a blister/superficial ulcer of the left great toe. Remainder the exam unremarkable pedal pulses are palpable DP +2 PT plus one over 4 Refill time 3 seconds mild +1 edema on the left no other open wounds no ulcers no discharge or drainage noted the ulcers are debrided at this time and Silvadene and gauze dressing are applied.       Assessment & Plan:  Assessment diabetes history peripheral neuropathy and neuritis and neuralgia effecting both lower extremities with Charcot-type changes. Patient has a 3 by a millimeter ulceration which is debrided down to the dermal level full-thickness of the skin is identified at this time mild serous bloody drainage is noted no culture obtained at this time is extremely contaminated patient is placed empirically on clindamycin 3 mg 3 times a day reappointed in one to 2 weeks for  follow-up and nail check her toe or ulcer check. Maintain antibiotic regimen as instructed offload the foot whenever possible. Patient does work as a Marine scientist at [employer] and will be off the next couple of days allowing this to heal or resolve. Contact us visiting exacerbations in the interim.  [name] DPM

## 2014-11-11 NOTE — Patient Instructions (Signed)
ANTIBACTERIAL SOAP INSTRUCTIONS  THE DAY AFTER PROCEDURE  Please follow the instructions your doctor has marked.   Shower as usual. Before getting out, place a drop of antibacterial liquid soap (Dial) on a wet, clean washcloth.  Gently wipe washcloth over affected area.  Afterward, rinse the area with warm water.  Blot the area dry with a soft cloth and cover with antibiotic ointment (neosporin, polysporin, bacitracin) and band aid or gauze and tape  Place 3-4 drops of antibacterial liquid soap in a quart of warm tap water.  Submerge foot into water for 20 minutes.  If bandage was applied after your procedure, leave on to allow for easy lift off, then remove and continue with soak for the remaining time.  Next, blot area dry with a soft cloth and cover with a bandage.  Apply other medications as directed by your doctor, such as cortisporin otic solution (eardrops) or neosporin antibiotic ointment  T Silvadene and gauze dressing changes daily until resolved

## 2014-11-29 ENCOUNTER — Encounter: Payer: Self-pay | Admitting: Family Medicine

## 2014-11-29 ENCOUNTER — Ambulatory Visit: Payer: 59

## 2014-11-29 NOTE — Progress Notes (Signed)
Patient ID: [name], female   DOB: 04-Oct-1966, 48 y.o.   MRN: [id number] Reviewed: Agree with the documentation and management of our [place].

## 2014-12-28 ENCOUNTER — Other Ambulatory Visit: Payer: Self-pay | Admitting: Endocrinology

## 2014-12-28 DIAGNOSIS — E041 Nontoxic single thyroid nodule: Secondary | ICD-10-CM

## 2014-12-29 ENCOUNTER — Other Ambulatory Visit: Payer: Self-pay | Admitting: Endocrinology

## 2014-12-29 DIAGNOSIS — E042 Nontoxic multinodular goiter: Secondary | ICD-10-CM

## 2014-12-30 ENCOUNTER — Ambulatory Visit
Admission: RE | Admit: 2014-12-30 | Discharge: 2014-12-30 | Disposition: A | Discharge status: Home / Self Care | Payer: PRIVATE HEALTH INSURANCE | Source: Ambulatory Visit | Attending: Endocrinology | Admitting: Endocrinology

## 2014-12-30 ENCOUNTER — Other Ambulatory Visit (HOSPITAL_COMMUNITY)
Admission: RE | Admit: 2014-12-30 | Discharge: 2014-12-30 | Disposition: A | Discharge status: Home / Self Care | Payer: 59 | Source: Ambulatory Visit | Attending: Interventional Radiology | Admitting: Interventional Radiology

## 2014-12-30 DIAGNOSIS — E041 Nontoxic single thyroid nodule: Secondary | ICD-10-CM | POA: Insufficient documentation

## 2014-12-30 DIAGNOSIS — E042 Nontoxic multinodular goiter: Secondary | ICD-10-CM

## 2015-01-12 HISTORY — PX: COLONOSCOPY: SHX174

## 2015-01-26 ENCOUNTER — Ambulatory Visit (INDEPENDENT_AMBULATORY_CARE_PROVIDER_SITE_OTHER): Payer: Self-pay | Admitting: Surgery

## 2015-02-06 ENCOUNTER — Emergency Department (INDEPENDENT_AMBULATORY_CARE_PROVIDER_SITE_OTHER): Payer: 59

## 2015-02-06 ENCOUNTER — Emergency Department (HOSPITAL_COMMUNITY)
Admission: EM | Admit: 2015-02-06 | Discharge: 2015-02-06 | Disposition: A | Discharge status: Home / Self Care | Payer: 59 | Source: Home / Self Care | Attending: Emergency Medicine | Admitting: Emergency Medicine

## 2015-02-06 ENCOUNTER — Encounter (HOSPITAL_COMMUNITY): Payer: Self-pay | Admitting: Emergency Medicine

## 2015-02-06 DIAGNOSIS — S80212A Abrasion, left knee, initial encounter: Secondary | ICD-10-CM | POA: Diagnosis not present

## 2015-02-06 DIAGNOSIS — S8002XA Contusion of left knee, initial encounter: Secondary | ICD-10-CM

## 2015-02-06 DIAGNOSIS — S93402A Sprain of unspecified ligament of left ankle, initial encounter: Secondary | ICD-10-CM

## 2015-02-06 DIAGNOSIS — S63501A Unspecified sprain of right wrist, initial encounter: Secondary | ICD-10-CM | POA: Diagnosis not present

## 2015-02-06 NOTE — ED Notes (Signed)
Fell yesterday.  Stepped off curb and stepped on bottle cap and fell: left knee and left ankle and right hand. These areas "throb".  She also has soreness in right chest and neck, right knee and ankle.  Bruising to right hand, bilateral knees

## 2015-02-06 NOTE — ED Provider Notes (Signed)
CSN: [id number]     Arrival date & time 02/06/15  1617 History   First MD Initiated Contact with Patient 02/06/15 1705     Chief Complaint  Patient presents with  . Fall   (Consider location/radiation/quality/duration/timing/severity/associated sxs/prior Treatment) HPI Comments: Patient states she stumbled stepping down a curb yesterday and fell. Injured right wrist, left knee and left ankle. Has been treating areas of discomfort with ice, elevation and pain medication at home with little improvement. Denies head injury or LOC.   The history is provided by the patient.    Past Medical History  Diagnosis Date  . Diabetes mellitus   . Hypertension   . GERD (gastroesophageal reflux disease)   . Fibromyalgia   . Proteinuria    Past Surgical History  Procedure Laterality Date  . Cholecystectomy    . Abdominal hysterectomy    . Lumbar disc surgery     No family history on file. History  Substance Use Topics  . Smoking status: Never Smoker   . Smokeless tobacco: Never Used  . Alcohol Use: No   OB History    No data available     Review of Systems  All other systems reviewed and are negative.   Allergies  Clarithromycin; Latex; Milk-related compounds; Moxifloxacin; and Penicillins  Home Medications   Prior to Admission medications   Medication Sig Start Date End Date Taking? Authorizing Provider  ALPRAZolam Duanne Moron) 0.5 MG tablet Take 0.5 mg by mouth at bedtime as needed. For anxiety    Historical Provider, MD  ammonium lactate (LAC-HYDRIN) 12 % cream Apply topically as needed for dry skin. 03/17/14   [name], DPM  aspirin 81 MG tablet Take 81 mg by mouth daily.      Historical Provider, MD  atorvastatin (LIPITOR) 20 MG tablet Take 20 mg by mouth daily.    Historical Provider, MD  BAYER CONTOUR NEXT TEST test strip  08/06/14   Historical Provider, MD  clindamycin (CLEOCIN) 300 MG capsule Take 1 capsule (300 mg total) by mouth 3 (three) times daily. 11/11/14   [name], DPM  cyclobenzaprine (FLEXERIL) 10 MG tablet Take 10 mg by mouth 3 (three) times daily as needed. For muscle spasms    Historical Provider, MD  cyclobenzaprine (FLEXERIL) 5 MG tablet Take 1 tablet (5 mg total) by mouth 3 (three) times daily as needed for muscle spasms. 03/30/14   Harden Mo, MD  D3-50 50000 UNITS capsule  10/14/14   Historical Provider, MD  dicyclomine (BENTYL) 10 MG capsule Take 10 mg by mouth 2 (two) times daily.      Historical Provider, MD  ergocalciferol (VITAMIN D2) 50000 UNITS capsule Take 50,000 Units by mouth 3 (three) times a week.      Historical Provider, MD  fluticasone (FLONASE) 50 MCG/ACT nasal spray Place 2 sprays into the nose daily.      Historical Provider, MD  furosemide (LASIX) 40 MG tablet Take 40 mg by mouth 2 (two) times daily.      Historical Provider, MD  gabapentin (NEURONTIN) 300 MG capsule TAKE 1 CAPSULE (300 MG TOTAL) BY MOUTH AT BEDTIME.    [name], DPM  HYDROcodone-acetaminophen (LORTAB 7.5) 7.5-500 MG per tablet Take 1 tablet by mouth every 6 (six) hours as needed. For pain    Historical Provider, MD  insulin regular human CONCENTRATED (HUMULIN R) 500 UNIT/ML SOLN injection Inject 2 Units into the skin. 2.0 units /hour 3557-3220, 1.5units 2542-7062     Historical Provider, MD  INVOKAMET 50-1000 MG TABS  10/20/14   Historical Provider, MD  lisinopril (PRINIVIL,ZESTRIL) 40 MG tablet Take 40 mg by mouth 2 (two) times daily.     Historical Provider, MD  meloxicam (MOBIC) 15 MG tablet Take 1 tablet (15 mg total) by mouth daily. 03/30/14   Harden Mo, MD  MetFORMIN HCl (GLUCOPHAGE PO) Take 1,000 mg by mouth 2 (two) times daily.      Historical Provider, MD  metoprolol (TOPROL-XL) 50 MG 24 hr tablet Take 50 mg by mouth 2 (two) times daily.     Historical Provider, MD  Multiple Vitamins-Minerals (MULTIVITAMIN PO) Take 2 tablets by mouth daily. gummies    Historical Provider, MD  ondansetron (ZOFRAN) 4 MG tablet Take 1 tablet (4 mg total)  by mouth every 6 (six) hours. 11/11/12   Awilda Metro, NP  ondansetron (ZOFRAN) 4 MG tablet Take 1 tablet (4 mg total) by mouth every 8 (eight) hours as needed for nausea. 10/06/13   [name], DPM  OVER THE COUNTER MEDICATION Take 3 tablets by mouth daily. [place]    Historical Provider, MD  oxyCODONE-acetaminophen (PERCOCET) 5-325 MG per tablet 1 to 2 tablets every 6 hours as needed for pain. 03/30/14   Harden Mo, MD  pantoprazole (PROTONIX) 40 MG tablet Take 40 mg by mouth daily.    Historical Provider, MD  promethazine (PHENERGAN) 25 MG tablet Take 25 mg by mouth every 6 (six) hours as needed.    Historical Provider, MD   BP 126/88 mmHg  Pulse 92  Temp(Src) 97.4 F (36.3 C) (Oral)  Resp 16  SpO2 96% Physical Exam  Constitutional: She is oriented to person, place, and time. She appears well-developed and well-nourished. No distress.  +obese  HENT:  Head: Normocephalic and atraumatic.  Cardiovascular: Normal rate.   Pulmonary/Chest: Effort normal.  Musculoskeletal:       Right wrist: She exhibits decreased range of motion, tenderness, bony tenderness and swelling. She exhibits no crepitus and no deformity.       Left ankle: She exhibits decreased range of motion, swelling and ecchymosis. She exhibits no deformity, no laceration and normal pulse. Tenderness. Lateral malleolus tenderness found. No head of 5th metatarsal and no proximal fibula tenderness found. Achilles tendon normal.       Arms:      Legs:      Feet:  Outlined area is region of moderate discomfort with ROM and associated moderate STS  Neurological: She is alert and oriented to person, place, and time.  Skin: Skin is warm and dry.  +intact  Psychiatric: She has a normal mood and affect. Her behavior is normal.  Nursing note and vitals reviewed.   ED Course  Procedures (including critical care time) Labs Review Labs Reviewed - No data to display  Imaging Review Dg Wrist Complete  Right  02/06/2015   CLINICAL DATA:  Fall.  Right wrist pain  EXAM: RIGHT WRIST - COMPLETE 3+ VIEW  COMPARISON:  02/09/2010  FINDINGS: No fracture identified. Scapholunate angle 66 degrees. No obvious scapholunate separation.  IMPRESSION: 1. Borderline elevated scapholunate angle, which could reflect a tear of the scapholunate ligament. Otherwise negative exam.   Electronically Signed   By: Van Clines M.D.   On: 02/06/2015 18:31   Dg Ankle Complete Left  02/06/2015   CLINICAL DATA:  Status post fall while coming out of store; left ankle pain. Initial encounter.  EXAM: LEFT ANKLE COMPLETE - 3+ VIEW  COMPARISON:  None.  FINDINGS: There is no evidence of acute fracture or dislocation. Multiple small osseous fragments arising about the medial malleolus likely reflect remote injury. A small osseous fragment adjacent to the base of fifth metatarsal may reflect remote injury. Plantar and posterior calcaneal spurs are seen. The ankle mortise is intact; the interosseous space is within normal limits. No talar tilt or subluxation is seen.  The joint spaces are preserved. Mild lateral soft tissue swelling is noted.  IMPRESSION: No evidence of acute fracture or dislocation.   Electronically Signed   By: Garald Balding M.D.   On: 02/06/2015 18:34   Dg Knee Complete 4 Views Left  02/06/2015   CLINICAL DATA:  Status post fall while coming out of store. Left knee pain. Initial encounter.  EXAM: LEFT KNEE - COMPLETE 4+ VIEW  COMPARISON:  None.  FINDINGS: There is no evidence of fracture or dislocation. The joint spaces are preserved. No significant degenerative change is seen; the patellofemoral joint is grossly unremarkable in appearance.  No significant joint effusion is seen. The visualized soft tissues are normal in appearance.  IMPRESSION: No evidence of fracture or dislocation.   Electronically Signed   By: Garald Balding M.D.   On: 02/06/2015 18:35     MDM   1. Contusion of left knee, initial encounter    2. Abrasion of left knee, initial encounter   3. Sprain of left ankle, initial encounter   4. Sprain of right wrist, initial encounter    The films of your left knee and left ankle were without evidence of fracture or dislocation. The films of your right wrist were without evidence of bony injury, however, they do suggest the possibility of a torn ligament in your wrist. Please wear ankle and wrist splint during they day for comfort and ice and elevated injured extremities as much as possible. You may use your existing pain medication at home as prescribed. I would advise close follow up with Dr. Burney Gauze or the orthopedic hand surgeon of your choice with regard to your wrist injury. And if left knee and ankle pain do not improve over the next 1-2 weeks, please follow up with Dr. Alain Marion (general orthopedics) or the general orthopedist of your choice for further evaluation.     Lutricia Feil, [place] 02/06/15 1946

## 2015-02-06 NOTE — Discharge Instructions (Signed)
The films of your left knee and left ankle were without evidence of fracture or dislocation. The films of your right wrist were without evidence of bony injury, however, they do suggest the possibility of a torn ligament in your wrist. Please wear ankle and wrist splint during they day for comfort and ice and elevated injured extremities as much as possible. You may use your existing pain medication at home as prescribed. I would advise close follow up with Dr. Burney Gauze or the orthopedic hand surgeon of your choice with regard to your wrist injury. And if left knee and ankle pain do not improve over the next 1-2 weeks, please follow up with Dr. Alain Marion (general orthopedics) or the general orthopedist of your choice for further evaluation.   Abrasion An abrasion is a cut or scrape of the skin. Abrasions do not extend through all layers of the skin and most heal within 10 days. It is important to care for your abrasion properly to prevent infection. CAUSES  Most abrasions are caused by falling on, or gliding across, the ground or other surface. When your skin rubs on something, the outer and inner layer of skin rubs off, causing an abrasion. DIAGNOSIS  Your caregiver will be able to diagnose an abrasion during a physical exam.  TREATMENT  Your treatment depends on how large and deep the abrasion is. Generally, your abrasion will be cleaned with water and a mild soap to remove any dirt or debris. An antibiotic ointment may be put over the abrasion to prevent an infection. A bandage (dressing) may be wrapped around the abrasion to keep it from getting dirty.  You may need a tetanus shot if:  You cannot remember when you had your last tetanus shot.  You have never had a tetanus shot.  The injury broke your skin. If you get a tetanus shot, your arm may swell, get red, and feel warm to the touch. This is common and not a problem. If you need a tetanus shot and you choose not to have one, there is a rare  chance of getting tetanus. Sickness from tetanus can be serious.  HOME CARE INSTRUCTIONS   If a dressing was applied, change it at least once a day or as directed by your caregiver. If the bandage sticks, soak it off with warm water.   Wash the area with water and a mild soap to remove all the ointment 2 times a day. Rinse off the soap and pat the area dry with a clean towel.   Reapply any ointment as directed by your caregiver. This will help prevent infection and keep the bandage from sticking. Use gauze over the wound and under the dressing to help keep the bandage from sticking.   Change your dressing right away if it becomes wet or dirty.   Only take over-the-counter or prescription medicines for pain, discomfort, or fever as directed by your caregiver.   Follow up with your caregiver within 24-48 hours for a wound check, or as directed. If you were not given a wound-check appointment, look closely at your abrasion for redness, swelling, or pus. These are signs of infection. SEEK IMMEDIATE MEDICAL CARE IF:   You have increasing pain in the wound.   You have redness, swelling, or tenderness around the wound.   You have pus coming from the wound.   You have a fever or persistent symptoms for more than 2-3 days.  You have a fever and your symptoms suddenly get  worse.  You have a bad smell coming from the wound or dressing.  MAKE SURE YOU:   Understand these instructions.  Will watch your condition.  Will get help right away if you are not doing well or get worse. Document Released: 09/05/2005 Document Revised: 11/12/2012 Document Reviewed: 10/30/2011 [facility] Patient Information 2015 [place], [place]. This information is not intended to replace advice given to you by your health care provider. Make sure you discuss any questions you have with your health care provider.  Ankle Sprain An ankle sprain is an injury to the strong, fibrous tissues (ligaments) that hold the  bones of your ankle joint together.  CAUSES An ankle sprain is usually caused by a fall or by twisting your ankle. Ankle sprains most commonly occur when you step on the outer edge of your foot, and your ankle turns inward. People who participate in sports are more prone to these types of injuries.  SYMPTOMS   Pain in your ankle. The pain may be present at rest or only when you are trying to stand or walk.  Swelling.  Bruising. Bruising may develop immediately or within 1 to 2 days after your injury.  Difficulty standing or walking, particularly when turning corners or changing directions. DIAGNOSIS  Your caregiver will ask you details about your injury and perform a physical exam of your ankle to determine if you have an ankle sprain. During the physical exam, your caregiver will press on and apply pressure to specific areas of your foot and ankle. Your caregiver will try to move your ankle in certain ways. An X-ray exam may be done to be sure a bone was not broken or a ligament did not separate from one of the bones in your ankle (avulsion fracture).  TREATMENT  Certain types of braces can help stabilize your ankle. Your caregiver can make a recommendation for this. Your caregiver may recommend the use of medicine for pain. If your sprain is severe, your caregiver may refer you to a surgeon who helps to restore function to parts of your skeletal system (orthopedist) or a physical therapist. [place] ice to your injury for 1-2 days or as directed by your caregiver. Applying ice helps to reduce inflammation and pain.  Put ice in a plastic bag.  Place a towel between your skin and the bag.  Leave the ice on for 15-20 minutes at a time, every 2 hours while you are awake.  Only take over-the-counter or prescription medicines for pain, discomfort, or fever as directed by your caregiver.  Elevate your injured ankle above the level of your heart as much as possible for 2-3  days.  If your caregiver recommends crutches, use them as instructed. Gradually put weight on the affected ankle. Continue to use crutches or a cane until you can walk without feeling pain in your ankle.  If you have a plaster splint, wear the splint as directed by your caregiver. Do not rest it on anything harder than a pillow for the first 24 hours. Do not put weight on it. Do not get it wet. You may take it off to take a shower or bath.  You may have been given an elastic bandage to wear around your ankle to provide support. If the elastic bandage is too tight (you have numbness or tingling in your foot or your foot becomes cold and blue), adjust the bandage to make it comfortable.  If you have an air splint, you may  blow more air into it or let air out to make it more comfortable. You may take your splint off at night and before taking a shower or bath. Wiggle your toes in the splint several times per day to decrease swelling. SEEK MEDICAL CARE IF:   You have rapidly increasing bruising or swelling.  Your toes feel extremely cold or you lose feeling in your foot.  Your pain is not relieved with medicine. SEEK IMMEDIATE MEDICAL CARE IF:  Your toes are numb or blue.  You have severe pain that is increasing. MAKE SURE YOU:   Understand these instructions.  Will watch your condition.  Will get help right away if you are not doing well or get worse. Document Released: 11/26/2005 Document Revised: 08/20/2012 Document Reviewed: 12/08/2011 [facility] Patient Information 2015 [place], [place]. This information is not intended to replace advice given to you by your health care provider. Make sure you discuss any questions you have with your health care provider.  Contusion A contusion is a deep bruise. Contusions are the result of an injury that caused bleeding under the skin. The contusion may turn blue, purple, or yellow. Minor injuries will give you a painless contusion, but more severe  contusions may stay painful and swollen for a few weeks.  CAUSES  A contusion is usually caused by a blow, trauma, or direct force to an area of the body. SYMPTOMS   Swelling and redness of the injured area.  Bruising of the injured area.  Tenderness and soreness of the injured area.  Pain. DIAGNOSIS  The diagnosis can be made by taking a history and physical exam. An X-ray, CT scan, or MRI may be needed to determine if there were any associated injuries, such as fractures. TREATMENT  Specific treatment will depend on what area of the body was injured. In general, the best treatment for a contusion is resting, icing, elevating, and applying cold compresses to the injured area. Over-the-counter medicines may also be recommended for pain control. Ask your caregiver what the best treatment is for your contusion. HOME CARE INSTRUCTIONS   Put ice on the injured area.  Put ice in a plastic bag.  Place a towel between your skin and the bag.  Leave the ice on for 15-20 minutes, 3-4 times a day, or as directed by your health care provider.  Only take over-the-counter or prescription medicines for pain, discomfort, or fever as directed by your caregiver. Your caregiver may recommend avoiding anti-inflammatory medicines (aspirin, ibuprofen, and naproxen) for 48 hours because these medicines may increase bruising.  Rest the injured area.  If possible, elevate the injured area to reduce swelling. SEEK IMMEDIATE MEDICAL CARE IF:   You have increased bruising or swelling.  You have pain that is getting worse.  Your swelling or pain is not relieved with medicines. MAKE SURE YOU:   Understand these instructions.  Will watch your condition.  Will get help right away if you are not doing well or get worse. Document Released: 09/05/2005 Document Revised: 12/01/2013 Document Reviewed: 10/01/2011 [facility] Patient Information 2015 [place], [place]. This information is not intended to replace  advice given to you by your health care provider. Make sure you discuss any questions you have with your health care provider.  Wrist Splint A wrist splint is a brace that holds your wrist in a fixed position. It can be used to stabilize your wrist so that broken bones and sprains can heal faster, with less pain. It can also help to  relieve pressure on the nerve that runs down the middle of your arm (median nerve). Splints are available in drugstores without a prescription. They are also available by prescription from orthopedic and medical supply stores. Custom splints made from lightweight materials can be made by physical or occupational therapist. [place] your splint as instructed by your caregiver. It may be worn while you sleep.  Your caregiver may instruct you how to perform certain exercises at home. These exercises help maintain muscle strength in your hand and wrist. They also help to maintain motion in your fingers. SEEK MEDICAL CARE IF:  You start to lose feeling in your hand or fingers.  Your skin or fingernails turn blue or gray, or they feel cold. MAKE SURE YOU:   Understand these instructions.  Will watch your condition.  Will get help right away if you are not doing well or get worse. Document Released: 11/08/2006 Document Revised: 02/18/2012 Document Reviewed: 03/09/2014 [facility] Patient Information 2015 [place], [place]. This information is not intended to replace advice given to you by your health care provider. Make sure you discuss any questions you have with your health care provider.

## 2015-02-14 NOTE — Pre-Procedure Instructions (Signed)
[name]  02/14/2015   Your procedure is scheduled on:  March 11  Report to [facility] at [phone].  Call this number if you have problems the morning of surgery: [phone]   Remember:   Do not eat food or drink liquids after midnight.   Take these medicines the morning of surgery with A SIP OF WATER: dexlansoprazole (Dexilant), Flonase nasal spray, Metoprolol (Toprol-XL), Ondansetron (Zofran) if needed, Hydrocodone-acetaminiophen (norco) if needed  Stop taking Mobic,  Asprin, Ibuprofen, Aleve, BC's, Goody's, Herbal medications, and Fish Oil  Do not wear jewelry, make-up or nail polish.  Do not wear lotions, powders, or perfumes. You may wear deodorant.  Do not shave 48 hours prior to surgery. Men may shave face and neck.  Do not bring valuables to the hospital.  [facility] is not responsible  for any belongings or valuables.               Contacts, dentures or bridgework may not be worn into surgery.  Leave suitcase in the car. After surgery it may be brought to your room.  For patients admitted to the hospital, discharge time is determined by your  treatment team.               Patients discharged the day of surgery will not be allowed to drive home.    Special Instructions:  - Preparing for Surgery  Before surgery, you can play an important role.  Because skin is not sterile, your skin needs to be as free of germs as possible.  You can reduce the number of germs on you skin by washing with CHG (chlorahexidine gluconate) soap before surgery.  CHG is an antiseptic cleaner which kills germs and bonds with the skin to continue killing germs even after washing.  Please DO NOT use if you have an allergy to CHG or antibacterial soaps.  If your skin becomes reddened/irritated stop using the CHG and inform your nurse when you arrive at Short Stay.  Do not shave (including legs and underarms) for at least 48 hours prior to the first CHG shower.   You may shave your face.  Please follow these instructions carefully:   1.  Shower with CHG Soap the night before surgery and the morning of Surgery.  2.  If you choose to wash your hair, wash your hair first as usual with your  normal shampoo.  3.  After you shampoo, rinse your hair and body thoroughly to remove the  Shampoo.  4.  Use CHG as you would any other liquid soap.  You can apply chg directly  to the skin and wash gently with scrungie or a clean washcloth.  5.  Apply the CHG Soap to your body ONLY FROM THE NECK DOWN.  Do not use on open wounds or open sores.  Avoid contact with your eyes,  ears, mouth and genitals (private parts).  Wash genitals (private parts)  with your normal soap.  6.  Wash thoroughly, paying special attention to the area where your surgery will be performed.  7.  Thoroughly rinse your body with warm water from the neck down.  8.  DO NOT shower/wash with your normal soap after using and rinsing off  the CHG Soap.  9.  Pat yourself dry with a clean towel.            10.  Wear clean pajamas.  11.  Place clean sheets on your bed the night of your first shower and do not        sleep with pets.  Day of Surgery  Do not apply any lotions/deoderants the morning of surgery.  Please wear clean clothes to the hospital/surgery center.      Please read over the following fact sheets that you were given: Pain Booklet, Coughing and Deep Breathing and Surgical Site Infection Prevention

## 2015-02-15 ENCOUNTER — Encounter (HOSPITAL_COMMUNITY): Payer: Self-pay

## 2015-02-15 ENCOUNTER — Encounter (HOSPITAL_COMMUNITY)
Admission: RE | Admit: 2015-02-15 | Discharge: 2015-02-15 | Disposition: A | Discharge status: Home / Self Care | Payer: 59 | Source: Ambulatory Visit | Attending: Surgery | Admitting: Surgery

## 2015-02-15 ENCOUNTER — Ambulatory Visit (HOSPITAL_COMMUNITY)
Admission: RE | Admit: 2015-02-15 | Discharge: 2015-02-15 | Disposition: A | Discharge status: Home / Self Care | Payer: 59 | Source: Ambulatory Visit | Attending: Anesthesiology | Admitting: Anesthesiology

## 2015-02-15 DIAGNOSIS — K449 Diaphragmatic hernia without obstruction or gangrene: Secondary | ICD-10-CM | POA: Diagnosis not present

## 2015-02-15 DIAGNOSIS — Z01811 Encounter for preprocedural respiratory examination: Secondary | ICD-10-CM

## 2015-02-15 DIAGNOSIS — E119 Type 2 diabetes mellitus without complications: Secondary | ICD-10-CM | POA: Diagnosis not present

## 2015-02-15 DIAGNOSIS — Z01812 Encounter for preprocedural laboratory examination: Secondary | ICD-10-CM | POA: Insufficient documentation

## 2015-02-15 DIAGNOSIS — K219 Gastro-esophageal reflux disease without esophagitis: Secondary | ICD-10-CM | POA: Insufficient documentation

## 2015-02-15 DIAGNOSIS — I10 Essential (primary) hypertension: Secondary | ICD-10-CM | POA: Diagnosis not present

## 2015-02-15 DIAGNOSIS — Z01810 Encounter for preprocedural cardiovascular examination: Secondary | ICD-10-CM | POA: Diagnosis not present

## 2015-02-15 DIAGNOSIS — Z01818 Encounter for other preprocedural examination: Secondary | ICD-10-CM | POA: Insufficient documentation

## 2015-02-15 HISTORY — DX: Other specified postprocedural states: R11.2

## 2015-02-15 HISTORY — DX: Personal history of other diseases of the digestive system: Z87.19

## 2015-02-15 HISTORY — DX: Other specified postprocedural states: Z98.890

## 2015-02-15 HISTORY — DX: Angina pectoris, unspecified: I20.9

## 2015-02-15 LAB — CBC
HCT: 42.4 % (ref 36.0–46.0)
Hemoglobin: 14.3 g/dL (ref 12.0–15.0)
MCH: 31.4 pg (ref 26.0–34.0)
MCHC: 33.7 g/dL (ref 30.0–36.0)
MCV: 93.2 fL (ref 78.0–100.0)
Platelets: 238 10*3/uL (ref 150–400)
RBC: 4.55 MIL/uL (ref 3.87–5.11)
RDW: 13.1 % (ref 11.5–15.5)
WBC: 8.6 10*3/uL (ref 4.0–10.5)

## 2015-02-15 LAB — SURGICAL PCR SCREEN
MRSA, PCR: POSITIVE — AB
Staphylococcus aureus: POSITIVE — AB

## 2015-02-15 LAB — BASIC METABOLIC PANEL
Anion gap: 13 (ref 5–15)
BUN: 13 mg/dL (ref 6–23)
CO2: 22 mmol/L (ref 19–32)
Calcium: 9.4 mg/dL (ref 8.4–10.5)
Chloride: 103 mmol/L (ref 96–112)
Creatinine, Ser: 0.86 mg/dL (ref 0.50–1.10)
GFR calc Af Amer: >90 mL/min (ref >90)
GFR calc non Af Amer: 79 mL/min — ABNORMAL LOW (ref >90)
Glucose, Bld: 161 mg/dL — ABNORMAL HIGH (ref 70–99)
Potassium: 4.1 mmol/L (ref 3.5–5.1)
Sodium: 138 mmol/L (ref 135–145)

## 2015-02-15 NOTE — Progress Notes (Addendum)
Anesthesia Chart Review:  Pt is 49 year old female scheduled for total thyroidectomy on 02/18/2015 with Dr. Harlow Asa.   PMH includes: HTN, DM, GERD. Never smoker. BMI 44.  Medications include: insulin pump. Per DM coordinator, pt to stop pump when she arrives [facility] and she will be placed on insulin drip.   Preoperative labs reviewed.  Glucose 161.   Chest x-ray reviewed. No acute abnormalities.   EKG: NSR. Inferior infarct, age undetermined. Anteroseptal infarct, age undetermined. Appears unchanged from previous tracing dated 08/23/2011.  Cardiac cath 04/20/2009: 1.Preserved overall left ventricular systolic function with minimally  elevated left ventricular end-diastolic pressure. 2. No significant areas of high-grade focal coronary obstruction.  If no changes, I anticipate pt can proceed with surgery as scheduled.   Willeen Cass, FNP-BC [facility]/Anesthesiology Phone: [phone] 02/15/2015 4:27 PM

## 2015-02-15 NOTE — Progress Notes (Signed)
Quick Note:  Pre-operative chest x-ray is acceptable for scheduled surgery.  Dakwan Pridgen M. Haydon Dorris, MD, FACS [facility] Office: [phone]   ______ 

## 2015-02-15 NOTE — Progress Notes (Signed)
Mupirocin script called into [facility]

## 2015-02-15 NOTE — Progress Notes (Signed)
Quick Note:  EKG is acceptable for scheduled surgery.  Nobie Alleyne M. Tayvia Faughnan, MD, FACS [facility] Office: [phone]   ______ 

## 2015-02-15 NOTE — Progress Notes (Signed)
PCP is Dr Forde Dandy, he also treats her DM. States that she saw a Cardiologist years ago Dr Hyman Hopes.  Card cath and stress test noted from 2010. Denies having a recent EKG or CXR. Denies ever having an echo.Reports her last HgbA1c was 8.0. She has an insulin pump U500 basel rate. [name] DM coor called and informed. [name] recommends turning the pump off when she gets here on DOS and starting her on an insulin Drip. Pt reports her CBGs run 100 or less in the morning.

## 2015-02-17 MED ORDER — SODIUM CHLORIDE 0.9 % IV SOLN
1500.0000 mg | INTRAVENOUS | Status: AC
  Administered 2015-02-18: 1500 mg via INTRAVENOUS
  Filled 2015-02-17: qty 1500

## 2015-02-18 ENCOUNTER — Encounter (HOSPITAL_COMMUNITY)
Admission: RE | Disposition: A | Discharge status: Home / Self Care | Payer: Self-pay | Source: Ambulatory Visit | Attending: Surgery

## 2015-02-18 ENCOUNTER — Encounter (HOSPITAL_COMMUNITY): Payer: Self-pay | Admitting: Surgery

## 2015-02-18 ENCOUNTER — Observation Stay (HOSPITAL_COMMUNITY)
Admission: RE | Admit: 2015-02-18 | Discharge: 2015-02-19 | Disposition: A | Discharge status: Home / Self Care | Payer: 59 | Source: Ambulatory Visit | Attending: Surgery | Admitting: Surgery

## 2015-02-18 ENCOUNTER — Ambulatory Visit (HOSPITAL_COMMUNITY): Payer: 59 | Admitting: Anesthesiology

## 2015-02-18 ENCOUNTER — Ambulatory Visit (HOSPITAL_COMMUNITY): Payer: 59 | Admitting: Emergency Medicine

## 2015-02-18 DIAGNOSIS — M797 Fibromyalgia: Secondary | ICD-10-CM | POA: Diagnosis not present

## 2015-02-18 DIAGNOSIS — Z91040 Latex allergy status: Secondary | ICD-10-CM | POA: Insufficient documentation

## 2015-02-18 DIAGNOSIS — J449 Chronic obstructive pulmonary disease, unspecified: Secondary | ICD-10-CM | POA: Insufficient documentation

## 2015-02-18 DIAGNOSIS — D440 Neoplasm of uncertain behavior of thyroid gland: Principal | ICD-10-CM | POA: Insufficient documentation

## 2015-02-18 DIAGNOSIS — Z87442 Personal history of urinary calculi: Secondary | ICD-10-CM | POA: Diagnosis not present

## 2015-02-18 DIAGNOSIS — Z881 Allergy status to other antibiotic agents status: Secondary | ICD-10-CM | POA: Diagnosis not present

## 2015-02-18 DIAGNOSIS — Z91018 Allergy to other foods: Secondary | ICD-10-CM | POA: Insufficient documentation

## 2015-02-18 DIAGNOSIS — G43909 Migraine, unspecified, not intractable, without status migrainosus: Secondary | ICD-10-CM | POA: Insufficient documentation

## 2015-02-18 DIAGNOSIS — I10 Essential (primary) hypertension: Secondary | ICD-10-CM | POA: Insufficient documentation

## 2015-02-18 DIAGNOSIS — Z90710 Acquired absence of both cervix and uterus: Secondary | ICD-10-CM | POA: Diagnosis not present

## 2015-02-18 DIAGNOSIS — Z86010 Personal history of colonic polyps: Secondary | ICD-10-CM | POA: Diagnosis not present

## 2015-02-18 DIAGNOSIS — E039 Hypothyroidism, unspecified: Secondary | ICD-10-CM | POA: Diagnosis not present

## 2015-02-18 DIAGNOSIS — K219 Gastro-esophageal reflux disease without esophagitis: Secondary | ICD-10-CM | POA: Insufficient documentation

## 2015-02-18 DIAGNOSIS — R1310 Dysphagia, unspecified: Secondary | ICD-10-CM | POA: Insufficient documentation

## 2015-02-18 DIAGNOSIS — Z9049 Acquired absence of other specified parts of digestive tract: Secondary | ICD-10-CM | POA: Diagnosis not present

## 2015-02-18 DIAGNOSIS — E785 Hyperlipidemia, unspecified: Secondary | ICD-10-CM | POA: Diagnosis not present

## 2015-02-18 DIAGNOSIS — Z880 Allergy status to penicillin: Secondary | ICD-10-CM | POA: Insufficient documentation

## 2015-02-18 DIAGNOSIS — F1590 Other stimulant use, unspecified, uncomplicated: Secondary | ICD-10-CM | POA: Diagnosis not present

## 2015-02-18 DIAGNOSIS — Z794 Long term (current) use of insulin: Secondary | ICD-10-CM | POA: Insufficient documentation

## 2015-02-18 DIAGNOSIS — E119 Type 2 diabetes mellitus without complications: Secondary | ICD-10-CM | POA: Insufficient documentation

## 2015-02-18 DIAGNOSIS — E041 Nontoxic single thyroid nodule: Secondary | ICD-10-CM | POA: Diagnosis present

## 2015-02-18 HISTORY — PX: THYROIDECTOMY: SHX17

## 2015-02-18 HISTORY — DX: Hypothyroidism, unspecified: E03.9

## 2015-02-18 HISTORY — DX: Migraine, unspecified, not intractable, without status migrainosus: G43.909

## 2015-02-18 HISTORY — DX: Unspecified chronic bronchitis: J42

## 2015-02-18 HISTORY — PX: TOTAL THYROIDECTOMY: SHX2547

## 2015-02-18 HISTORY — DX: Pure hypercholesterolemia, unspecified: E78.00

## 2015-02-18 HISTORY — DX: Type 2 diabetes mellitus without complications: E11.9

## 2015-02-18 HISTORY — DX: Iron deficiency anemia, unspecified: D50.9

## 2015-02-18 HISTORY — DX: Personal history of other diseases of the musculoskeletal system and connective tissue: Z87.39

## 2015-02-18 LAB — GLUCOSE, CAPILLARY
Glucose-Capillary: 125 mg/dL — ABNORMAL HIGH (ref 70–99)
Glucose-Capillary: 128 mg/dL — ABNORMAL HIGH (ref 70–99)
Glucose-Capillary: 124 mg/dL — ABNORMAL HIGH (ref 70–99)
Glucose-Capillary: 146 mg/dL — ABNORMAL HIGH (ref 70–99)
Glucose-Capillary: 161 mg/dL — ABNORMAL HIGH (ref 70–99)
Glucose-Capillary: 175 mg/dL — ABNORMAL HIGH (ref 70–99)
Glucose-Capillary: 149 mg/dL — ABNORMAL HIGH (ref 70–99)
Glucose-Capillary: 140 mg/dL — ABNORMAL HIGH (ref 70–99)

## 2015-02-18 SURGERY — THYROIDECTOMY
Anesthesia: General | Site: Neck

## 2015-02-18 MED ORDER — NEOSTIGMINE METHYLSULFATE 10 MG/10ML IV SOLN
INTRAVENOUS | Status: DC | PRN
  Administered 2015-02-18: 4 mg via INTRAVENOUS

## 2015-02-18 MED ORDER — MIDAZOLAM HCL 5 MG/5ML IJ SOLN
INTRAMUSCULAR | Status: DC | PRN
  Administered 2015-02-18 (×2): 1 mg via INTRAVENOUS

## 2015-02-18 MED ORDER — MIDAZOLAM HCL 2 MG/2ML IJ SOLN
INTRAMUSCULAR | Status: AC
  Filled 2015-02-18: qty 2

## 2015-02-18 MED ORDER — LACTATED RINGERS IV SOLN: INTRAVENOUS | Status: DC

## 2015-02-18 MED ORDER — LEVOTHYROXINE SODIUM 88 MCG PO TABS: 88.0000 ug | ORAL_TABLET | Freq: Every day | ORAL | Status: DC

## 2015-02-18 MED ORDER — KCL IN DEXTROSE-NACL 20-5-0.45 MEQ/L-%-% IV SOLN
INTRAVENOUS | Status: DC
  Administered 2015-02-18: 18:00:00 via INTRAVENOUS
  Filled 2015-02-18 (×2): qty 1000

## 2015-02-18 MED ORDER — LIDOCAINE HCL (CARDIAC) 20 MG/ML IV SOLN
INTRAVENOUS | Status: DC | PRN
  Administered 2015-02-18: 100 mg via INTRAVENOUS

## 2015-02-18 MED ORDER — HYDROMORPHONE HCL 1 MG/ML IJ SOLN: 1.0000 mg | INTRAMUSCULAR | Status: DC | PRN

## 2015-02-18 MED ORDER — FENTANYL CITRATE 0.05 MG/ML IJ SOLN
INTRAMUSCULAR | Status: AC
  Filled 2015-02-18: qty 5

## 2015-02-18 MED ORDER — GABAPENTIN 300 MG PO CAPS
300.0000 mg | ORAL_CAPSULE | Freq: Every day | ORAL | Status: DC
  Administered 2015-02-18: 300 mg via ORAL
  Filled 2015-02-18: qty 1

## 2015-02-18 MED ORDER — HYDROMORPHONE HCL 1 MG/ML IJ SOLN
0.2500 mg | INTRAMUSCULAR | Status: DC | PRN
  Administered 2015-02-18: 0.25 mg via INTRAVENOUS

## 2015-02-18 MED ORDER — PROMETHAZINE HCL 25 MG/ML IJ SOLN: 6.2500 mg | INTRAMUSCULAR | Status: DC | PRN

## 2015-02-18 MED ORDER — PROPOFOL 10 MG/ML IV BOLUS
INTRAVENOUS | Status: DC | PRN
  Administered 2015-02-18: 200 mg via INTRAVENOUS

## 2015-02-18 MED ORDER — GLYCOPYRROLATE 0.2 MG/ML IJ SOLN
INTRAMUSCULAR | Status: DC | PRN
  Administered 2015-02-18: 0.6 mg via INTRAVENOUS

## 2015-02-18 MED ORDER — SODIUM CHLORIDE 0.9 % IV SOLN: INTRAVENOUS | Status: DC

## 2015-02-18 MED ORDER — INSULIN REGULAR HUMAN 100 UNIT/ML IJ SOLN
250.0000 [IU] | INTRAMUSCULAR | Status: DC | PRN
  Administered 2015-02-18: .6 [IU]/h via INTRAVENOUS

## 2015-02-18 MED ORDER — SODIUM CHLORIDE 0.9 % IV SOLN
INTRAVENOUS | Status: DC
  Filled 2015-02-18: qty 2.5

## 2015-02-18 MED ORDER — ACETAMINOPHEN 325 MG PO TABS: 650.0000 mg | ORAL_TABLET | ORAL | Status: DC | PRN

## 2015-02-18 MED ORDER — ONDANSETRON HCL 4 MG/2ML IJ SOLN
4.0000 mg | Freq: Four times a day (QID) | INTRAMUSCULAR | Status: DC | PRN
  Administered 2015-02-18: 4 mg via INTRAVENOUS
  Filled 2015-02-18: qty 2

## 2015-02-18 MED ORDER — OXYCODONE HCL 5 MG PO TABS: 5.0000 mg | ORAL_TABLET | Freq: Four times a day (QID) | ORAL | Status: DC | PRN

## 2015-02-18 MED ORDER — LACTATED RINGERS IV SOLN
INTRAVENOUS | Status: DC
  Administered 2015-02-18: 08:00:00 via INTRAVENOUS

## 2015-02-18 MED ORDER — ROCURONIUM BROMIDE 50 MG/5ML IV SOLN
INTRAVENOUS | Status: AC
  Filled 2015-02-18: qty 1

## 2015-02-18 MED ORDER — LIDOCAINE HCL (CARDIAC) 20 MG/ML IV SOLN
INTRAVENOUS | Status: AC
  Filled 2015-02-18: qty 5

## 2015-02-18 MED ORDER — DEXTROSE 50 % IV SOLN
25.0000 mL | INTRAVENOUS | Status: DC | PRN
  Filled 2015-02-18: qty 50

## 2015-02-18 MED ORDER — FENTANYL CITRATE 0.05 MG/ML IJ SOLN
INTRAMUSCULAR | Status: DC | PRN
  Administered 2015-02-18 (×5): 50 ug via INTRAVENOUS

## 2015-02-18 MED ORDER — PHENYLEPHRINE HCL 10 MG/ML IJ SOLN
INTRAMUSCULAR | Status: DC | PRN
  Administered 2015-02-18: 40 ug via INTRAVENOUS
  Administered 2015-02-18 (×4): 80 ug via INTRAVENOUS

## 2015-02-18 MED ORDER — INSULIN REGULAR BOLUS VIA INFUSION
0.0000 [IU] | Freq: Three times a day (TID) | INTRAVENOUS | Status: DC
  Administered 2015-02-19: 1.1 [IU] via INTRAVENOUS
  Filled 2015-02-18: qty 10

## 2015-02-18 MED ORDER — 0.9 % SODIUM CHLORIDE (POUR BTL) OPTIME
TOPICAL | Status: DC | PRN
  Administered 2015-02-18: 1000 mL

## 2015-02-18 MED ORDER — HYDROMORPHONE HCL 1 MG/ML IJ SOLN
INTRAMUSCULAR | Status: AC
  Filled 2015-02-18: qty 1

## 2015-02-18 MED ORDER — PHENYLEPHRINE 40 MCG/ML (10ML) SYRINGE FOR IV PUSH (FOR BLOOD PRESSURE SUPPORT)
PREFILLED_SYRINGE | INTRAVENOUS | Status: AC
  Filled 2015-02-18: qty 10

## 2015-02-18 MED ORDER — FLUTICASONE PROPIONATE 50 MCG/ACT NA SUSP
1.0000 | Freq: Every day | NASAL | Status: DC
  Administered 2015-02-18: 1 via NASAL
  Filled 2015-02-18: qty 16

## 2015-02-18 MED ORDER — ALPRAZOLAM 0.5 MG PO TABS: 0.5000 mg | ORAL_TABLET | Freq: Every evening | ORAL | Status: DC | PRN

## 2015-02-18 MED ORDER — ONDANSETRON HCL 4 MG/2ML IJ SOLN
INTRAMUSCULAR | Status: DC | PRN
  Administered 2015-02-18: 4 mg via INTRAVENOUS

## 2015-02-18 MED ORDER — ROCURONIUM BROMIDE 100 MG/10ML IV SOLN
INTRAVENOUS | Status: DC | PRN
  Administered 2015-02-18: 50 mg via INTRAVENOUS
  Administered 2015-02-18: 10 mg via INTRAVENOUS

## 2015-02-18 MED ORDER — PHENYLEPHRINE HCL 10 MG/ML IJ SOLN
10.0000 mg | INTRAVENOUS | Status: DC | PRN
  Administered 2015-02-18: 15 ug/min via INTRAVENOUS

## 2015-02-18 MED ORDER — CALCIUM CARBONATE-VITAMIN D 500-200 MG-UNIT PO TABS: 2.0000 | ORAL_TABLET | Freq: Three times a day (TID) | ORAL | Status: DC

## 2015-02-18 MED ORDER — HYDROCODONE-ACETAMINOPHEN 5-325 MG PO TABS
1.0000 | ORAL_TABLET | ORAL | Status: DC | PRN
  Administered 2015-02-18: 1 via ORAL
  Administered 2015-02-19: 2 via ORAL
  Filled 2015-02-18: qty 2
  Filled 2015-02-18: qty 1

## 2015-02-18 MED ORDER — HEMOSTATIC AGENTS (NO CHARGE) OPTIME
TOPICAL | Status: DC | PRN
Start: 2015-02-18 — End: 2015-02-18
  Administered 2015-02-18: 1 via TOPICAL

## 2015-02-18 MED ORDER — ARTIFICIAL TEARS OP OINT
TOPICAL_OINTMENT | OPHTHALMIC | Status: AC
  Filled 2015-02-18: qty 3.5

## 2015-02-18 MED ORDER — ONDANSETRON HCL 4 MG PO TABS
4.0000 mg | ORAL_TABLET | Freq: Four times a day (QID) | ORAL | Status: DC | PRN
  Administered 2015-02-18: 4 mg via ORAL
  Filled 2015-02-18: qty 1

## 2015-02-18 MED ORDER — CALCIUM CARBONATE 1250 (500 CA) MG PO TABS
1.0000 | ORAL_TABLET | Freq: Three times a day (TID) | ORAL | Status: DC
  Administered 2015-02-18 – 2015-02-19 (×2): 500 mg via ORAL
  Filled 2015-02-18 (×2): qty 1

## 2015-02-18 MED ORDER — LACTATED RINGERS IV SOLN
INTRAVENOUS | Status: DC | PRN
  Administered 2015-02-18: 09:00:00 via INTRAVENOUS

## 2015-02-18 MED ORDER — SCOPOLAMINE 1 MG/3DAYS TD PT72
MEDICATED_PATCH | TRANSDERMAL | Status: AC
  Administered 2015-02-18: 1.5 mg
  Filled 2015-02-18: qty 1

## 2015-02-18 MED ORDER — DEXTROSE-NACL 5-0.45 % IV SOLN: INTRAVENOUS | Status: DC

## 2015-02-18 MED ORDER — LISINOPRIL 40 MG PO TABS
40.0000 mg | ORAL_TABLET | Freq: Two times a day (BID) | ORAL | Status: DC
  Administered 2015-02-18 (×2): 40 mg via ORAL
  Filled 2015-02-18: qty 2
  Filled 2015-02-18: qty 1

## 2015-02-18 MED ORDER — FUROSEMIDE 40 MG PO TABS
40.0000 mg | ORAL_TABLET | Freq: Two times a day (BID) | ORAL | Status: DC
  Administered 2015-02-18 – 2015-02-19 (×2): 40 mg via ORAL
  Filled 2015-02-18 (×2): qty 1

## 2015-02-18 MED ORDER — SCOPOLAMINE 1 MG/3DAYS TD PT72
1.0000 | MEDICATED_PATCH | TRANSDERMAL | Status: DC
  Administered 2015-02-18: 1 via TRANSDERMAL
  Filled 2015-02-18: qty 1

## 2015-02-18 MED ORDER — INSULIN PUMP
Freq: Three times a day (TID) | SUBCUTANEOUS | Status: DC
  Administered 2015-02-18: 1.9 via SUBCUTANEOUS
  Filled 2015-02-18: qty 1

## 2015-02-18 MED ORDER — METOPROLOL SUCCINATE ER 50 MG PO TB24
50.0000 mg | ORAL_TABLET | Freq: Two times a day (BID) | ORAL | Status: DC
  Administered 2015-02-18: 100 mg via ORAL
  Filled 2015-02-18: qty 2

## 2015-02-18 MED ORDER — SUCCINYLCHOLINE CHLORIDE 20 MG/ML IJ SOLN
INTRAMUSCULAR | Status: DC | PRN
  Administered 2015-02-18: 140 mg via INTRAVENOUS

## 2015-02-18 SURGICAL SUPPLY — 51 items
ATTRACTOMAT 16X20 MAGNETIC DRP (DRAPES) ×2 IMPLANT
BLADE SURG 10 STRL SS (BLADE) ×2 IMPLANT
BLADE SURG 15 STRL LF DISP TIS (BLADE) ×1 IMPLANT
BLADE SURG 15 STRL SS (BLADE) ×2
BLADE SURG ROTATE 9660 (MISCELLANEOUS) IMPLANT
CANISTER SUCTION 2500CC (MISCELLANEOUS) ×2 IMPLANT
CHLORAPREP W/TINT 10.5 ML (MISCELLANEOUS) ×2 IMPLANT
CLIP TI MEDIUM 24 (CLIP) ×2 IMPLANT
CLIP TI WIDE RED SMALL 24 (CLIP) ×2 IMPLANT
CONT SPEC 4OZ CLIKSEAL STRL BL (MISCELLANEOUS) IMPLANT
COVER SURGICAL LIGHT HANDLE (MISCELLANEOUS) ×2 IMPLANT
CRADLE DONUT ADULT HEAD (MISCELLANEOUS) ×2 IMPLANT
DRAPE PED LAPAROTOMY (DRAPES) ×2 IMPLANT
DRAPE UTILITY XL STRL (DRAPES) ×2 IMPLANT
ELECT CAUTERY BLADE 6.4 (BLADE) ×2 IMPLANT
ELECT REM PT RETURN 9FT ADLT (ELECTROSURGICAL) ×2
ELECTRODE REM PT RTRN 9FT ADLT (ELECTROSURGICAL) ×1 IMPLANT
GAUZE SPONGE 4X4 12PLY STRL (GAUZE/BANDAGES/DRESSINGS) ×2 IMPLANT
GAUZE SPONGE 4X4 16PLY XRAY LF (GAUZE/BANDAGES/DRESSINGS) ×2 IMPLANT
GLOVE BIOGEL PI IND STRL 7.0 (GLOVE) IMPLANT
GLOVE BIOGEL PI IND STRL 8 (GLOVE) IMPLANT
GLOVE BIOGEL PI IND STRL 8.5 (GLOVE) IMPLANT
GLOVE BIOGEL PI INDICATOR 7.0 (GLOVE) ×1
GLOVE BIOGEL PI INDICATOR 8 (GLOVE) ×1
GLOVE BIOGEL PI INDICATOR 8.5 (GLOVE) ×1
GLOVE SURG ORTHO 8.0 STRL STRW (GLOVE) ×1 IMPLANT
GLOVE SURG SS PI 7.0 STRL IVOR (GLOVE) ×1 IMPLANT
GLOVE SURG SS PI 8.0 STRL IVOR (GLOVE) ×3 IMPLANT
GOWN STRL REUS W/ TWL LRG LVL3 (GOWN DISPOSABLE) ×2 IMPLANT
GOWN STRL REUS W/ TWL XL LVL3 (GOWN DISPOSABLE) ×1 IMPLANT
GOWN STRL REUS W/TWL LRG LVL3 (GOWN DISPOSABLE) ×4
GOWN STRL REUS W/TWL XL LVL3 (GOWN DISPOSABLE) ×2
HEMOSTAT SURGICEL 2X4 FIBR (HEMOSTASIS) ×2 IMPLANT
KIT BASIN OR (CUSTOM PROCEDURE TRAY) ×2 IMPLANT
KIT ROOM TURNOVER OR (KITS) ×2 IMPLANT
LIQUID BAND (GAUZE/BANDAGES/DRESSINGS) ×2 IMPLANT
NS IRRIG 1000ML POUR BTL (IV SOLUTION) ×2 IMPLANT
PACK SURGICAL SETUP 50X90 (CUSTOM PROCEDURE TRAY) ×2 IMPLANT
PAD ARMBOARD 7.5X6 YLW CONV (MISCELLANEOUS) ×2 IMPLANT
PENCIL BUTTON HOLSTER BLD 10FT (ELECTRODE) ×2 IMPLANT
SHEARS HARMONIC 9CM CVD (BLADE) ×2 IMPLANT
SPECIMEN JAR MEDIUM (MISCELLANEOUS) IMPLANT
SPONGE INTESTINAL PEANUT (DISPOSABLE) ×2 IMPLANT
SUT MNCRL AB 4-0 PS2 18 (SUTURE) ×2 IMPLANT
SUT SILK 2 0 (SUTURE) ×2
SUT SILK 2-0 18XBRD TIE 12 (SUTURE) ×1 IMPLANT
SUT VIC AB 3-0 SH 18 (SUTURE) ×2 IMPLANT
SYR BULB 3OZ (MISCELLANEOUS) ×2 IMPLANT
TOWEL OR 17X24 6PK STRL BLUE (TOWEL DISPOSABLE) ×2 IMPLANT
TOWEL OR 17X26 10 PK STRL BLUE (TOWEL DISPOSABLE) ×2 IMPLANT
TUBE CONNECTING 12X1/4 (SUCTIONS) ×2 IMPLANT

## 2015-02-18 NOTE — Progress Notes (Addendum)
Dr. Algis Liming had paged the Diabetes Coordinator on call to have patient start the insulin pump once in room on 6N.  Spoke with patient in room [phone] and got the settings on her pump: (U 500 insulin in pump) Basal rates: 12:00 am 1.85 units/hr.   5:30 am 2.00 units/hr. Total: 47.2 units  (U 500 insulin)  CHO ratio: 1:50 Sensitivity: 12:00 am =50   9:30 pm = 45 Blood glucose target range=90-115 mg/dl  Patient alert, but nauseated.  Site of insertion catheter is Left lower abdomen. Site was changed last PM. Insulin pump started today at 1:45 pm. Continuing IV insulin drip at 0.6 units/hr for one more hour before discontinuing drip.  Insulin pump order set entered and patient contract to be signed by patient.  Requests a Regular diet because she will count her CHO. Will continue to follow while in hospital. Harvel Ricks RN BSN CDE

## 2015-02-18 NOTE — Progress Notes (Signed)
Spoke with Dr Oletta Lamas and informed her of DM coor recommendations of stopping insulin pump and starting glucostabilzer.  New orders received.

## 2015-02-18 NOTE — Anesthesia Postprocedure Evaluation (Signed)
  Anesthesia Post-op Note  Patient: [name]  Procedure(s) Performed: Procedure(s): TOTAL THYROIDECTOMY (N/A)  Patient Location: PACU  Anesthesia Type:General  Level of Consciousness: awake, alert  and oriented  Airway and Oxygen Therapy: Patient Spontanous Breathing and Patient connected to nasal cannula oxygen  Post-op Pain: mild  Post-op Assessment: Post-op Vital signs reviewed, Patient's Cardiovascular Status Stable, Respiratory Function Stable, RESPIRATORY FUNCTION UNSTABLE and Pain level controlled  Post-op Vital Signs: stable  Last Vitals:  Filed Vitals:   02/18/15 1328  BP: 125/69  Pulse: 90  Temp: 36.4 C  Resp: 16    Complications: No apparent anesthesia complications

## 2015-02-18 NOTE — Interval H&P Note (Signed)
History and Physical Interval Note:  02/18/2015 9:26 AM  [name]  has presented today for surgery, with the diagnosis of THYROID NEOPLASM OF UNCERTAIN BEHAVIOR.  The various methods of treatment have been discussed with the patient and family. After consideration of risks, benefits and other options for treatment, the patient has consented to    Procedure(s): TOTAL THYROIDECTOMY (N/A) as a surgical intervention .    [name] history has been reviewed, patient examined, no change in status, stable for surgery.  I have reviewed the patient's chart and labs.  Questions were answered to the patient's satisfaction.    [name], MD, [facility] Office: [place]

## 2015-02-18 NOTE — Progress Notes (Signed)
Arabella Merles, RN provided pt with peppermint and orange-ginger aromatherapy to help decrease post op nausea. Provided information on how to self-administer. Will monitor for any questions patient might have.

## 2015-02-18 NOTE — Anesthesia Procedure Notes (Signed)
Procedure Name: Intubation Date/Time: 02/18/2015 9:50 AM Performed by: Scheryl Darter Pre-anesthesia Checklist: Patient identified, Emergency Drugs available, Suction available, Patient being monitored and Timeout performed Patient Re-evaluated:Patient Re-evaluated prior to inductionOxygen Delivery Method: Circle system utilized Preoxygenation: Pre-oxygenation with 100% oxygen Intubation Type: Rapid sequence and IV induction Ventilation: Mask ventilation without difficulty Laryngoscope Size: Miller and 2 Grade View: Grade I Tube type: Oral Tube size: 7.5 mm Number of attempts: 1 Airway Equipment and Method: Stylet Placement Confirmation: ETT inserted through vocal cords under direct vision,  positive ETCO2 and breath sounds checked- equal and bilateral Secured at: 22 cm Tube secured with: Tape Dental Injury: Teeth and Oropharynx as per pre-operative assessment

## 2015-02-18 NOTE — Anesthesia Preprocedure Evaluation (Signed)
Anesthesia Evaluation  Patient identified by MRN, date of birth, ID band Patient awake    Reviewed: Allergy & Precautions, NPO status   History of Anesthesia Complications (+) PONV  Airway Mallampati: II  TM Distance: >3 FB Neck ROM: Full    Dental   Pulmonary COPD breath sounds clear to auscultation        Cardiovascular hypertension, + angina Rhythm:Regular Rate:Normal     Neuro/Psych    GI/Hepatic hiatal hernia, History noted. CE   Endo/Other  diabetes  Renal/GU Renal disease     Musculoskeletal   Abdominal   Peds  Hematology   Anesthesia Other Findings   Reproductive/Obstetrics                             Anesthesia Physical Anesthesia Plan  ASA: III  Anesthesia Plan: General   Post-op Pain Management:    Induction: Intravenous  Airway Management Planned: Oral ETT  Additional Equipment:   Intra-op Plan:   Post-operative Plan: Possible Post-op intubation/ventilation  Informed Consent: I have reviewed the patients History and Physical, chart, labs and discussed the procedure including the risks, benefits and alternatives for the proposed anesthesia with the patient or authorized representative who has indicated his/her understanding and acceptance.   Dental advisory given  Plan Discussed with: CRNA and Anesthesiologist  Anesthesia Plan Comments:         Anesthesia Quick Evaluation

## 2015-02-18 NOTE — Consult Note (Signed)
[facility] Medical Consultation  [name] Villarreal:[id number] DOB: [birth year] DOA: 02/18/2015 PCP: [name], MD   Requesting physician: Dr. Darene Lamer Date of consultation: 02/18/2015 Reason for consultation: Postoperative evaluation and management of IDDM.  Impression/Recommendations Principal Problem:   Neoplasm of uncertain behavior of thyroid gland, right lobe    1. IDDM/DM2 controlled: Patient is on insulin pump at home. Preoperatively, surgeons discussed with the diabetes coordinator and patient was transitioned to glucose stabilized her/insulin drip. Her CBGs intra-and immediate postoperatively have been well controlled (124-146). Patient is being resumed on a regular consistency diet. As discussed with patient's PCP, will resume insulin pump-requested diabetes coordinator to assist. Check A1C 2. Thyroid nodules: Suspicion for malignancy. Status post thyroidectomy. Follow pathology results. Management per primary service. 3. Essential hypertension: Controlled. Continue home medications-lisinopril and metoprolol. 4. Hyperlipidemia: Continue statins. 5. Hypothyroid: Continue levothyroxin  TRH will followup again tomorrow. Please contact me if I can be of assistance in the meanwhile. Thank you for this consultation.  Chief Complaint: None  HPI:  49 year old female patient with history of IDDM/DM2 on insulin pump, bilateral thyroid nodules, recent FNAC of right thyroid nodule showed cytologic atypia, HTN, GERD, HLD, electively underwent total thyroidectomy to evaluate and treat for malignancy, on 02/18/15. TRH consulted for postoperative DM/insulin management. Patient seen in PACU. Complained of obvious throat pain but denied any other complaints. Denied chest pain or dyspnea. Tolerating ice chips and sips of water. Recently sustained a mechanical fall and sprained her right wrist which is in a splint.  Review of Systems:  All systems reviewed and  apart from history of presenting illness, are negative.  Past Medical History  Diagnosis Date  . Diabetes mellitus   . Hypertension   . GERD (gastroesophageal reflux disease)   . Fibromyalgia   . Proteinuria   . PONV (postoperative nausea and vomiting)   . Anginal pain     chest pain  heart cath done  . Kidney stones   . Headache     migraines  . History of hiatal hernia    Past Surgical History  Procedure Laterality Date  . Cholecystectomy    . Abdominal hysterectomy    . Lumbar disc surgery    . Cardiac catheterization    . Colonoscopy    . Esophagogastroduodenoscopy     Social History:  reports that she has never smoked. She has never used smokeless tobacco. She reports that she does not drink alcohol or use illicit drugs. IADL  Allergies  Allergen Reactions  . Adhesive [Tape]   . Clarithromycin     REACTION: GI upset  . Latex     REACTION: rash  . Milk-Related Compounds     REACTION: GI upset- diarrhea  . Moxifloxacin     REACTION: GI upset  . Penicillins     REACTION: Sob- Palpitations- swelling   History reviewed. No pertinent family history.  Prior to Admission medications   Medication Sig Start Date End Date Taking? Authorizing Provider  ALPRAZolam [name]) 0.5 MG tablet Take 0.5 mg by mouth at bedtime as needed. For anxiety   Yes Historical Provider, MD  aspirin EC 81 MG tablet Take 81 mg by mouth daily.   Yes Historical Provider, MD  atorvastatin (LIPITOR) 20 MG tablet Take 20 mg by mouth daily.   Yes Historical Provider, MD  cetirizine (ZYRTEC) 10 MG tablet Take 10 mg by mouth at bedtime.   Yes Historical Provider, MD  cyclobenzaprine (FLEXERIL) 10 MG tablet Take 10 mg  by mouth 3 (three) times daily as needed.    Yes Historical Provider, MD  cyclobenzaprine (FLEXERIL) 10 MG tablet Take 10 mg by mouth at bedtime.   Yes Historical Provider, MD  D3-50 50000 UNITS capsule Take 50,000 Units by mouth once a week. Saturday 10/14/14  Yes Historical Provider, MD   dexlansoprazole (DEXILANT) 60 MG capsule Take 60 mg by mouth daily.   Yes Historical Provider, MD  fluticasone (FLONASE) 50 MCG/ACT nasal spray Place 1 spray into the nose daily.    Yes Historical Provider, MD  furosemide (LASIX) 40 MG tablet Take 40 mg by mouth 2 (two) times daily.     Yes Historical Provider, MD  gabapentin (NEURONTIN) 300 MG capsule TAKE 1 CAPSULE (300 MG TOTAL) BY MOUTH AT BEDTIME.   Yes [name], DPM  HYDROcodone-acetaminophen (NORCO) 7.5-325 MG per tablet Take 1 tablet by mouth every 6 (six) hours as needed for moderate pain.   Yes Historical Provider, MD  HYDROcodone-acetaminophen (NORCO) 7.5-325 MG per tablet Take 1 tablet by mouth at bedtime.   Yes Historical Provider, MD  ibuprofen (ADVIL,MOTRIN) 200 MG tablet Take 600-800 mg by mouth every 8 (eight) hours as needed for moderate pain.   Yes Historical Provider, MD  insulin regular human CONCENTRATED (HUMULIN R) 500 UNIT/ML SOLN injection Inject 2 Units into the skin. 2.0 units /hour 0500-2359, 1.5units 0737-1062    Yes Historical Provider, MD  INVOKAMET 50-1000 MG TABS Take 1 tablet by mouth 2 (two) times daily.  10/20/14  Yes Historical Provider, MD  lisinopril (PRINIVIL,ZESTRIL) 40 MG tablet Take 40 mg by mouth 2 (two) times daily.    Yes Historical Provider, MD  metoprolol (TOPROL-XL) 50 MG 24 hr tablet Take 50-100 mg by mouth 2 (two) times daily. Takes 50 mg in the morning and 100 mg in the evening   Yes Historical Provider, MD  Multiple Vitamins-Minerals (MULTIVITAMIN PO) Take 2 tablets by mouth daily. gummies   Yes Historical Provider, MD  ammonium lactate (LAC-HYDRIN) 12 % cream Apply topically as needed for dry skin. 03/17/14   [name], DPM  BAYER CONTOUR NEXT TEST test strip  08/06/14   Historical Provider, MD  calcium-vitamin D (OSCAL WITH D) 500-200 MG-UNIT per tablet Take 2 tablets by mouth 3 (three) times daily. 02/18/15   Jenna Gemma, MD  clindamycin (CLEOCIN) 300 MG capsule Take 1 capsule (300 mg  total) by mouth 3 (three) times daily. Patient not taking: Reported on 02/14/2015 11/11/14   [name], DPM  cyclobenzaprine (FLEXERIL) 5 MG tablet Take 1 tablet (5 mg total) by mouth 3 (three) times daily as needed for muscle spasms. Patient not taking: Reported on 02/14/2015 03/30/14   Harden Mo, MD  dicyclomine (BENTYL) 10 MG capsule Take 10 mg by mouth 2 (two) times daily as needed for spasms (For IBS).     Historical Provider, MD  levothyroxine (SYNTHROID) 88 MCG tablet Take 1 tablet (88 mcg total) by mouth daily before breakfast. 02/18/15   Jenna Gemma, MD  meloxicam (MOBIC) 15 MG tablet Take 1 tablet (15 mg total) by mouth daily. Patient not taking: Reported on 02/14/2015 03/30/14   Harden Mo, MD  ondansetron (ZOFRAN) 4 MG tablet Take 1 tablet (4 mg total) by mouth every 6 (six) hours. Patient not taking: Reported on 02/14/2015 11/11/12   Awilda Metro, NP  ondansetron (ZOFRAN) 4 MG tablet Take 1 tablet (4 mg total) by mouth every 8 (eight) hours as needed for nausea. Patient not taking: Reported  on 02/14/2015 10/06/13   [name], DPM  ondansetron (ZOFRAN-ODT) 4 MG disintegrating tablet Take 4 mg by mouth every 8 (eight) hours as needed for nausea or vomiting.    Historical Provider, MD  oxyCODONE (OXY IR/ROXICODONE) 5 MG immediate release tablet Take 1-2 tablets (5-10 mg total) by mouth every 6 (six) hours as needed for moderate pain. 02/18/15   Jenna Gemma, MD  oxyCODONE-acetaminophen (PERCOCET) 5-325 MG per tablet 1 to 2 tablets every 6 hours as needed for pain. Patient not taking: Reported on 02/14/2015 03/30/14   Harden Mo, MD   Physical Exam: Blood pressure 125/69, pulse 90, temperature 97.6 F (36.4 C), temperature source Oral, resp. rate 16, height 5\' 6"  (1.676 m), weight 122.925 kg (271 lb), SpO2 96 %. Filed Vitals:   02/18/15 1328  BP: 125/69  Pulse: 90  Temp: 97.6 F (36.4 C)  Resp: 16     General:  Moderately built and obese young female sitting up  comfortably on gurney and in no obvious distress.  Eyes: PERTLA  ENT: No acute findings.  Neck: Horizontal crease line surgical wound at base of neck-clean and dry. No JVD or carotid bruit. Supple.  Cardiovascular: S1 and S2 heard, RRR. No JVD, murmurs, clicks or pedal edema.  Respiratory: Clear to auscultation. No increased work of breathing.  Abdomen: Obese/nondistended, soft and nontender. Normal bowel sounds heard. No organomegaly or masses appreciated.  Skin: No acute findings.  Musculoskeletal: No acute findings  Psychiatric: Pleasant and cooperative.  Neurologic: Alert and oriented 4. No focal deficits.  Labs on Admission:  Basic Metabolic Panel:  Recent Labs Lab 02/15/15 0940  NA 138  K 4.1  CL 103  CO2 22  GLUCOSE 161*  BUN 13  CREATININE 0.86  CALCIUM 9.4   Liver Function Tests: No results for input(s): AST, ALT, ALKPHOS, BILITOT, PROT, ALBUMIN in the last 168 hours. No results for input(s): LIPASE, AMYLASE in the last 168 hours. No results for input(s): AMMONIA in the last 168 hours. CBC:  Recent Labs Lab 02/15/15 0940  WBC 8.6  HGB 14.3  HCT 42.4  MCV 93.2  PLT 238   Cardiac Enzymes: No results for input(s): CKTOTAL, CKMB, CKMBINDEX, TROPONINI in the last 168 hours. BNP: Invalid input(s): POCBNP CBG:  Recent Labs Lab 02/18/15 0730 02/18/15 0828 02/18/15 0928 02/18/15 1205  GLUCAP 128* 125* 124* 146*    Radiological Exams on Admission: No results found.  EKG: None seen in epic for today.  Time spent: 60 minutes.  [facility] Pager [phone].  If 7PM-7AM, please contact night-coverage [email or web address] Password [email or web address] 02/18/2015, 1:34 PM

## 2015-02-18 NOTE — Op Note (Signed)
Procedure Note  Pre-operative Diagnosis:  Thyroid neoplasm of uncertain behavior, bilateral thyroid nodules  Post-operative Diagnosis:  same  Surgeon:  [name], MD, FACS  Assistant:  Jackolyn Confer, MD, FACS   Procedure:  Total thyroidectomy  Anesthesia:  General  Estimated Blood Loss:  minimal  Drains: none         Specimen: thyroid to pathology  Indications:  The patient is a 49 year old female who presents with a thyroid nodule. Patient is referred by Dr. Carrolyn Meiers. Patient has bilateral thyroid nodules. These were diagnosed several years ago as an incidental finding on CT scan. She underwent fine-needle aspiration biopsy at [facility] with reportedly benign cytopathology. Patient has recently developed dysphagia. She has undergone evaluation by her gastroenterologist and found to have significant reflux. Ultrasound however demonstrated bilateral thyroid nodules with a dominant nodule on the right measuring 2.9 cm and the dominant nodule on the left measuring 3.7 cm. Right function is normal with a TSH level of 1.55. The patient underwent ultrasound-guided fine-needle aspiration biopsy on December 30, 2014. The right thyroid nodule shows cytologic atypia, Bethesda category 3, consistent with a follicular lesion of undetermined significance with micro-follicles and nuclear overlap. The left thyroid nodule was a benign follicular nodule measuring 3.7 cm. Patient is now referred to discuss thyroidectomy for definitive diagnosis of the right thyroid nodule and for possible relief of her dysphagia symptoms.   Procedure Details: Procedure was done in OR #2 at the [facility].  The patient was brought to the operating room and placed in a supine position on the operating room table.  Following administration of general anesthesia, the patient was positioned and then prepped and draped in the usual aseptic fashion.  After ascertaining that an adequate level of  anesthesia had been achieved, a Kocher incision was made with #15 blade.  Dissection was carried through subcutaneous tissues and platysma. Hemostasis was achieved with the electrocautery.  Skin flaps were elevated cephalad and caudad from the thyroid notch to the sternal notch.  The Mahorner self-retaining retractor was placed for exposure.  Strap muscles were incised in the midline and dissection was begun on the left side.  Strap muscles were reflected laterally.  Left thyroid lobe was enlarged and multinodular.  The left lobe was gently mobilized with blunt dissection.  Superior pole vessels were dissected out and divided individually between small and medium Ligaclips with the Harmonic scalpel.  The thyroid lobe was rolled anteriorly.  Branches of the inferior thyroid artery were divided between small Ligaclips with the Harmonic scalpel.  Inferior venous tributaries were divided between Ligaclips.  Both the superior and inferior parathyroid glands were identified and preserved on their vascular pedicles.  The recurrent laryngeal nerve was identified and preserved along its course.  The ligament of Gwenlyn Found was released with the electrocautery and the gland was mobilized onto the anterior trachea. Isthmus was mobilized across the midline.  There was large pyramidal lobe present. This was dissected off of the thyroid cartilage and resected with the isthmus.  Dry pack was placed in the left neck.  Next, the right thyroid lobe was gently mobilized with blunt dissection.  Right thyroid lobe was also multinodular.  Superior pole vessels were dissected out and divided between small and medium Ligaclips with the Harmonic scalpel.  Superior parathyroid was identified and preserved.  Inferior venous tributaries were divided between medium Ligaclips with the Harmonic scalpel.  The right thyroid lobe was rolled anteriorly and the branches of the  inferior thyroid artery divided between small Ligaclips.  The right recurrent  laryngeal nerve was identified and preserved along its course.  The ligament of Gwenlyn Found was released with the electrocautery.  The right thyroid lobe was mobilized onto the anterior trachea and the remainder of the thyroid was dissected off the anterior trachea and the thyroid was completely excised.  A single suture was used to mark the left superior pole. A double suture was used to mark the right superior pole.  The entire thyroid gland was submitted to pathology for review.  The neck was irrigated with warm saline.  Fibular was placed throughout the operative field.  Strap muscles were reapproximated in the midline with interrupted 3-0 Vicryl sutures.  Platysma was closed with interrupted 3-0 Vicryl sutures.  Skin was closed with a running 4-0 Monocryl subcuticular suture.  Wound was washed and dried and topical glue (Dermabond) was applied to the skin as dressing.  The patient was awakened from anesthesia and brought to the recovery room.  The patient tolerated the procedure well.   [name], MD, [facility] Office: [phone]

## 2015-02-18 NOTE — Transfer of Care (Signed)
Immediate Anesthesia Transfer of Care Note  Patient: [name]  Procedure(s) Performed: Procedure(s): TOTAL THYROIDECTOMY (N/A)  Patient Location: PACU  Anesthesia Type:General  Level of Consciousness: awake, alert , oriented and sedated  Airway & Oxygen Therapy: Patient Spontanous Breathing and Patient connected to nasal cannula oxygen  Post-op Assessment: Report given to RN, Post -op Vital signs reviewed and stable and Patient moving all extremities  Post vital signs: Reviewed and stable  Last Vitals:  Filed Vitals:   02/18/15 0723  BP: 126/71  Pulse: 91  Temp: 36.3 C  Resp: 18    Complications: No apparent anesthesia complications

## 2015-02-18 NOTE — H&P (Signed)
General Surgery [facility]  [name] 01/26/2015 10:44 AM Location: [facility] Patient #: [id number] DOB: [birth year] Married / Language: [name] / Race: White Female  History of Present Illness Jenna Regal MD; 01/26/2015 11:17 AM) Patient words: thyroid.  The patient is a 49 year old female who presents with a thyroid nodule. Patient is referred by Dr. Carrolyn Villarreal. Patient has bilateral thyroid nodules. These were diagnosed several years ago as an incidental finding on CT scan. She underwent fine-needle aspiration biopsy at [facility] with reportedly benign cytopathology. Patient has recently developed dysphagia. She has undergone evaluation by her gastroenterologist and found to have significant reflux. Ultrasound however demonstrated bilateral thyroid nodules with a dominant nodule on the right measuring 2.9 cm and the dominant nodule on the left measuring 3.7 cm. Right function is normal with a TSH level of 1.55. The patient underwent ultrasound-guided fine-needle aspiration biopsy on December 30, 2014. The right thyroid nodule shows cytologic atypia, Bethesda category 3, consistent with a follicular lesion of undetermined significance with micro-follicles and nuclear overlap. The left thyroid nodule was a benign follicular nodule measuring 3.7 cm. Patient is now referred to discuss thyroidectomy for definitive diagnosis of the right thyroid nodule and for possible relief of her dysphagia symptoms. Patient has no prior history of thyroid disease other than thyroid nodules. She has never been on thyroid medication. There is no immediate family history of thyroid disease or thyroid cancer. There is no family history of other endocrinopathies. Patient denies tremor. She denies palpitations.   Other Problems Elbert Ewings, CMA; 01/26/2015 10:44 AM) Diabetes Mellitus Gastroesophageal Reflux Disease High blood pressure Kidney  Stone Migraine Headache Other disease, cancer, significant illness  Past Surgical History Elbert Ewings, CMA; 01/26/2015 10:44 AM) Colon Polyp Removal - Colonoscopy Gallbladder Surgery - Laparoscopic Hysterectomy (not due to cancer) - Partial Oral Surgery Spinal Surgery - Lower Back  Diagnostic Studies History Elbert Ewings, CMA; 01/26/2015 10:44 AM) Colonoscopy within last year Mammogram within last year Pap Smear 1-5 years ago  Allergies Elbert Ewings, CMA; 01/26/2015 10:46 AM) Clarithromycin *CHEMICALS* Moxifloxacin HCl *Fluoroquinolones** Penicillin G Benzathine & Proc *PENICILLINS* Latex Milk  Medication History Elbert Ewings, CMA; 01/26/2015 10:52 AM) Xanax (0.5MG  Tablet, Oral) Active. Lac-Hydrin (12% Cream, External) Active. Aspirin EC (81MG  Tablet DR, Oral) Active. [employer] (In Vitro) Active. Flexeril (10MG  Tablet, Oral) Active. D3-50 (50000UNIT Capsule, Oral) Active. Bentyl (10MG  Capsule, Oral) Active. Flonase Allergy Relief (50MCG/ACT Suspension, Nasal) Active. Lasix (40MG  Tablet, Oral) Active. Neurontin (300MG  Capsule, Oral) Active. Lortab (7.5-325MG  Tablet, Oral) Active. HumuLIN R U-500 (CONCENTRATED) (500UNIT/ML Solution, Subcutaneous) Active. Invokamet (50-1000MG  Tablet, Oral) Active. Lisinopril (40MG  Tablet, Oral) Active. MetFORMIN & Diet Manage Prod (500MG  Misc, Oral) Active. Metoprolol Tartrate (50MG  Tablet, Oral) Active. Multivitamins (Oral) Active.  Social History Elbert Ewings, [place]; 01/26/2015 10:44 AM) Caffeine use Carbonated beverages. No alcohol use No drug use Tobacco use Never smoker.  Family History Elbert Ewings, [place]; 01/26/2015 10:44 AM) Arthritis Father, Mother. Heart disease in female family member before age 60 Heart disease in female family member before age 80 Hypertension Brother. Kidney Disease Father. Migraine Headache Mother. Prostate Cancer Father. Respiratory Condition  Mother. Thyroid problems Family Members In General.  Pregnancy / Birth History Elbert Ewings, [place]; 01/26/2015 10:44 AM) Age at menarche 9 years. Gravida 0 Para 0  Review of Systems Elbert Ewings CMA; 01/26/2015 10:44 AM) General Present- Appetite Loss. Not Present- Chills, Fatigue, Fever, Night Sweats, Weight Gain and Weight Loss. Skin Not Present-  Change in Wart/Mole, Dryness, Hives, Jaundice, New Lesions, Non-Healing Wounds, Rash and Ulcer. HEENT Present- Hoarseness and Sore Throat. Not Present- Earache, Hearing Loss, Nose Bleed, Oral Ulcers, Ringing in the Ears, Seasonal Allergies, Sinus Pain, Visual Disturbances, Wears glasses/contact lenses and Yellow Eyes. Respiratory Not Present- Bloody sputum, Chronic Cough, Difficulty Breathing, Snoring and Wheezing. Breast Not Present- Breast Mass, Breast Pain, Nipple Discharge and Skin Changes. Cardiovascular Not Present- Chest Pain, Difficulty Breathing Lying Down, Leg Cramps, Palpitations, Rapid Heart Rate, Shortness of Breath and Swelling of Extremities. Gastrointestinal Present- Difficulty Swallowing and Indigestion. Not Present- Abdominal Pain, Bloating, Bloody Stool, Change in Bowel Habits, Chronic diarrhea, Constipation, Excessive gas, Gets full quickly at meals, Hemorrhoids, Nausea, Rectal Pain and Vomiting. Female Genitourinary Not Present- Frequency, Nocturia, Painful Urination, Pelvic Pain and Urgency. Musculoskeletal Not Present- Back Pain, Joint Pain, Joint Stiffness, Muscle Pain, Muscle Weakness and Swelling of Extremities. Neurological Not Present- Decreased Memory, Fainting, Headaches, Numbness, Seizures, Tingling, Tremor, Trouble walking and Weakness. Psychiatric Not Present- Anxiety, Bipolar, Change in Sleep Pattern, Depression, Fearful and Frequent crying. Endocrine Not Present- Cold Intolerance, Excessive Hunger, Hair Changes, Heat Intolerance, Hot flashes and New Diabetes. Hematology Not Present- Easy Bruising, Excessive  bleeding, Gland problems, HIV and Persistent Infections.   Vitals Elbert Ewings CMA; 01/26/2015 10:53 AM) 01/26/2015 10:52 AM Weight: 271 lb Height: 66in Body Surface Area: 2.39 m Body Mass Index: 43.74 kg/m Temp.: 96.16F(Temporal)  Pulse: 72 (Regular)  Resp.: 16 (Unlabored)  BP: 130/72 (Sitting, Left Arm, Standard)    Physical Exam Jenna Regal MD; 01/26/2015 11:18 AM)  General - appears comfortable, no distress; not diaphorectic  HEENT - normocephalic; sclerae clear, gaze conjugate; mucous membranes moist, dentition good; voice normal  Neck - symmetric on extension; no palpable anterior or posterior cervical adenopathy; dominant nodule left thyroid lobe, approximately 3 cm, mobile with swallowing, nontender; right thyroid lobe with fullness but not a discrete nodule palpable on exam  Chest - clear bilaterally with rhonchi, rales, or wheeze  Cor - regular rhythm with normal rate; no significant murmur  Ext - non-tender without significant edema or lymphedema  Neuro - grossly intact; no tremor    Assessment & Plan Jenna Regal MD; 01/26/2015 11:20 AM)  NEOPLASM OF UNCERTAIN BEHAVIOR OF THYROID GLAND (237.4  D44.0)  I discussed the above findings at length with the patient and her husband. We reviewed her ultrasound report and her cytopathology report. I provided them with written literature regarding thyroid surgery to review at home.  Patient has bilateral dominant thyroid nodules which may be contributing somewhat to her symptoms of dysphagia. However, the right thyroid nodule on fine-needle aspiration biopsy has atypia and is consistent with a follicular lesion of undetermined significance with nuclear changes. After lengthy discussion, I have recommended total thyroidectomy for management of bilateral thyroid nodules and for definitive diagnosis of the right thyroid nodule with atypia. We have discussed risk and benefits of the procedure. We have discussed  the potential for recurrent laryngeal nerve injury and injury to parathyroid glands. We have discussed the hospital stay to be anticipated. There is approximately a 20-25% risk of malignancy which would require subsequent treatment with radioactive iodine. Patient and her husband understand these issues and wish to proceed with surgery in the near future. We will make arrangements for her procedure.  The risks and benefits of the procedure have been discussed at length with the patient. The patient understands the proposed procedure, potential alternative treatments, and the course of recovery to be expected. All  of the patient's questions have been answered at this time. The patient wishes to proceed with surgery.  Jenna Regal, MD, [facility] Office: [phone]

## 2015-02-19 DIAGNOSIS — D440 Neoplasm of uncertain behavior of thyroid gland: Secondary | ICD-10-CM | POA: Diagnosis not present

## 2015-02-19 LAB — BASIC METABOLIC PANEL
Anion gap: 8 (ref 5–15)
BUN: 10 mg/dL (ref 6–23)
CO2: 32 mmol/L (ref 19–32)
Calcium: 9.3 mg/dL (ref 8.4–10.5)
Chloride: 98 mmol/L (ref 96–112)
Creatinine, Ser: 0.92 mg/dL (ref 0.50–1.10)
GFR calc Af Amer: 85 mL/min — ABNORMAL LOW (ref >90)
GFR calc non Af Amer: 73 mL/min — ABNORMAL LOW (ref >90)
Glucose, Bld: 101 mg/dL — ABNORMAL HIGH (ref 70–99)
Potassium: 3.6 mmol/L (ref 3.5–5.1)
Sodium: 138 mmol/L (ref 135–145)

## 2015-02-19 LAB — GLUCOSE, CAPILLARY
Glucose-Capillary: 155 mg/dL — ABNORMAL HIGH (ref 70–99)
Glucose-Capillary: 109 mg/dL — ABNORMAL HIGH (ref 70–99)

## 2015-02-19 MED ORDER — ACETAMINOPHEN 325 MG PO TABS: 650.0000 mg | ORAL_TABLET | ORAL | Status: DC | PRN

## 2015-02-19 MED ORDER — LEVOTHYROXINE SODIUM 88 MCG PO TABS: 88.0000 ug | ORAL_TABLET | Freq: Every day | ORAL | Status: DC

## 2015-02-19 NOTE — Progress Notes (Signed)
Utilization Review completed.  

## 2015-02-19 NOTE — Discharge Summary (Signed)
Physician Discharge Summary  [name] QZR:[id number] DOB: 1966/05/15 DOA: 02/18/2015  PCP: [name], MD  Consultation:  Admit date: 02/18/2015 Discharge date: 02/19/2015  Recommendations for Outpatient Follow-up:   Follow-up Information    Follow up with [name], MD. Schedule an appointment as soon as possible for a visit in 2 weeks.   Specialty:  General Surgery   Contact information:   [address] [place] [address] [phone]      Discharge Diagnoses:  Thyroid neoplasm of uncertain behavior, bilateral thyroid nodules    Surgical Procedure: total thyroidectomy---Dr. Harlow Asa   Discharge Condition: stable Disposition: home  Diet recommendation: regular  Filed Weights   02/18/15 0723 02/18/15 0738  Weight: 271 lb (122.925 kg) 271 lb (122.925 kg)    Filed Vitals:   02/19/15 0633  BP: 118/60  Pulse: 88  Temp: 98 F (36.7 C)  Resp: 16      Hospital Course:  The patient underwent an elective total thyroidectomy.  She tolerated the procedure well and was transferred to the floor.  She remained stable overnight.  On POD#1 the patient was tolerating a diet, ambulating, pain well controlled and voiding.  She was therefore felt stable for discharge.  We reviewed her home meds.  I gave her an Rx for synthroid because her pharmacy is closed on the weekends.  Medication risks, benefits and therapeutic alternatives were reviewed with the patient.  She verbalizes understanding. Warning signs that warrant immediate attention were discussed.  She has a follow up scheduled with Dr. Harlow Asa.  She was encouraged to call with questions or concerns.    Physical Exam: General appearance: alert and oriented. Calm and cooperative No acute distress. VSS. Afebrile.  Resp: clear to auscultation bilaterally  Neck: incision-no drainage, dermabond in place.  Cardio: S1S1 RRR without murmurs or gallops. No edema. GI: soft round and nontender. +BS  x4 quadrants. No organomegaly, hernias or masses.     Discharge Instructions     Medication List    STOP taking these medications        clindamycin 300 MG capsule  Commonly known as:  CLEOCIN     meloxicam 15 MG tablet  Commonly known as:  MOBIC     ondansetron 4 MG disintegrating tablet  Commonly known as:  ZOFRAN-ODT     oxyCODONE-acetaminophen 5-325 MG per tablet  Commonly known as:  PERCOCET      TAKE these medications        acetaminophen 325 MG tablet  Commonly known as:  TYLENOL  Take 2 tablets (650 mg total) by mouth every 4 (four) hours as needed for mild pain, fever or headache.     ALPRAZolam 0.5 MG tablet  Commonly known as:  XANAX  Take 0.5 mg by mouth at bedtime as needed. For anxiety     ammonium lactate 12 % cream  Commonly known as:  LAC-HYDRIN  Apply topically as needed for dry skin.     aspirin EC 81 MG tablet  Take 81 mg by mouth daily.     atorvastatin 20 MG tablet  Commonly known as:  LIPITOR  Take 20 mg by mouth daily.     BAYER CONTOUR NEXT TEST test strip  Generic drug:  glucose blood     calcium-vitamin D 500-200 MG-UNIT per tablet  Commonly known as:  OSCAL WITH D  Take 2 tablets by mouth 3 (three) times daily.     cetirizine 10 MG tablet  Commonly known  as:  ZYRTEC  Take 10 mg by mouth at bedtime.     D3-50 50000 UNITS capsule  Generic drug:  Cholecalciferol  Take 50,000 Units by mouth once a week. Saturday     dexlansoprazole 60 MG capsule  Commonly known as:  DEXILANT  Take 60 mg by mouth daily.     dicyclomine 10 MG capsule  Commonly known as:  BENTYL  Take 10 mg by mouth 2 (two) times daily as needed for spasms (For IBS).     cyclobenzaprine 10 MG tablet  Commonly known as:  FLEXERIL  Take 10 mg by mouth at bedtime.     FLEXERIL 10 MG tablet  Generic drug:  cyclobenzaprine  Take 10 mg by mouth 3 (three) times daily as needed.     cyclobenzaprine 5 MG tablet  Commonly known as:  FLEXERIL  Take 1 tablet (5  mg total) by mouth 3 (three) times daily as needed for muscle spasms.     fluticasone 50 MCG/ACT nasal spray  Commonly known as:  FLONASE  Place 1 spray into the nose daily.     furosemide 40 MG tablet  Commonly known as:  LASIX  Take 40 mg by mouth 2 (two) times daily.     gabapentin 300 MG capsule  Commonly known as:  NEURONTIN  TAKE 1 CAPSULE (300 MG TOTAL) BY MOUTH AT BEDTIME.     HUMULIN R 500 UNIT/ML Soln injection  Generic drug:  insulin regular human CONCENTRATED  Inject 2 Units into the skin. 2.0 units /hour 0500-2359, 1.5units 0000-0459     HYDROcodone-acetaminophen 7.5-325 MG per tablet  Commonly known as:  NORCO  Take 1 tablet by mouth every 6 (six) hours as needed for moderate pain.     HYDROcodone-acetaminophen 7.5-325 MG per tablet  Commonly known as:  NORCO  Take 1 tablet by mouth at bedtime.     ibuprofen 200 MG tablet  Commonly known as:  ADVIL,MOTRIN  Take 600-800 mg by mouth every 8 (eight) hours as needed for moderate pain.     INVOKAMET 50-1000 MG Tabs  Generic drug:  Canagliflozin-Metformin HCl  Take 1 tablet by mouth 2 (two) times daily.     levothyroxine 88 MCG tablet  Commonly known as:  SYNTHROID  Take 1 tablet (88 mcg total) by mouth daily before breakfast.     lisinopril 40 MG tablet  Commonly known as:  PRINIVIL,ZESTRIL  Take 40 mg by mouth 2 (two) times daily.     metoprolol succinate 50 MG 24 hr tablet  Commonly known as:  TOPROL-XL  Take 50-100 mg by mouth 2 (two) times daily. Takes 50 mg in the morning and 100 mg in the evening     MULTIVITAMIN PO  Take 2 tablets by mouth daily. gummies     ondansetron 4 MG tablet  Commonly known as:  ZOFRAN  Take 1 tablet (4 mg total) by mouth every 6 (six) hours.     ondansetron 4 MG tablet  Commonly known as:  ZOFRAN  Take 1 tablet (4 mg total) by mouth every 8 (eight) hours as needed for nausea.     oxyCODONE 5 MG immediate release tablet  Commonly known as:  Oxy IR/ROXICODONE  Take 1-2  tablets (5-10 mg total) by mouth every 6 (six) hours as needed for moderate pain.           Follow-up Information    Follow up with [name], MD. Schedule an appointment as soon as possible for  a visit in 2 weeks.   Specialty:  General Surgery   Contact information:   [address] [place] [address] [phone]        The results of significant diagnostics from this hospitalization (including imaging, microbiology, ancillary and laboratory) are listed below for reference.    Significant Diagnostic Studies: Dg Chest 2 View  02/15/2015   CLINICAL DATA:  Preoperative assessment for thyroidectomy, history hypertension, type 2 diabetes, GERD, hiatal hernia  EXAM: CHEST  2 VIEW  COMPARISON:  11/25/2012  FINDINGS: Normal heart size, mediastinal contours, and pulmonary vascularity.  Lungs clear.  No pleural effusion or pneumothorax.  Bones unremarkable.  IMPRESSION: No acute abnormalities.   Electronically Signed   By: Lavonia Dana M.D.   On: 02/15/2015 10:00   Dg Wrist Complete Right  02/06/2015   CLINICAL DATA:  Fall.  Right wrist pain  EXAM: RIGHT WRIST - COMPLETE 3+ VIEW  COMPARISON:  02/09/2010  FINDINGS: No fracture identified. Scapholunate angle 66 degrees. No obvious scapholunate separation.  IMPRESSION: 1. Borderline elevated scapholunate angle, which could reflect a tear of the scapholunate ligament. Otherwise negative exam.   Electronically Signed   By: Van Clines M.D.   On: 02/06/2015 18:31   Dg Ankle Complete Left  02/06/2015   CLINICAL DATA:  Status post fall while coming out of store; left ankle pain. Initial encounter.  EXAM: LEFT ANKLE COMPLETE - 3+ VIEW  COMPARISON:  None.  FINDINGS: There is no evidence of acute fracture or dislocation. Multiple small osseous fragments arising about the medial malleolus likely reflect remote injury. A small osseous fragment adjacent to the base of fifth metatarsal may reflect remote injury. Plantar and posterior  calcaneal spurs are seen. The ankle mortise is intact; the interosseous space is within normal limits. No talar tilt or subluxation is seen.  The joint spaces are preserved. Mild lateral soft tissue swelling is noted.  IMPRESSION: No evidence of acute fracture or dislocation.   Electronically Signed   By: Garald Balding M.D.   On: 02/06/2015 18:34   Dg Knee Complete 4 Views Left  02/06/2015   CLINICAL DATA:  Status post fall while coming out of store. Left knee pain. Initial encounter.  EXAM: LEFT KNEE - COMPLETE 4+ VIEW  COMPARISON:  None.  FINDINGS: There is no evidence of fracture or dislocation. The joint spaces are preserved. No significant degenerative change is seen; the patellofemoral joint is grossly unremarkable in appearance.  No significant joint effusion is seen. The visualized soft tissues are normal in appearance.  IMPRESSION: No evidence of fracture or dislocation.   Electronically Signed   By: Garald Balding M.D.   On: 02/06/2015 18:35    Microbiology: Recent Results (from the past 240 hour(s))  Surgical pcr screen     Status: Abnormal   Collection Time: 02/15/15  9:40 AM  Result Value Ref Range Status   MRSA, PCR POSITIVE (A) NEGATIVE Final   Staphylococcus aureus POSITIVE (A) NEGATIVE Final    Comment:        The Xpert SA Assay (FDA approved for NASAL specimens in patients over 15 years of age), is one component of a comprehensive surveillance program.  Test performance has been validated by [facility] for patients greater than or equal to 85 year old. It is not intended to diagnose infection nor to guide or monitor treatment.      Labs: Basic Metabolic Panel:  Recent Labs Lab 02/15/15 0940 02/19/15 0600  NA  138 138  K 4.1 3.6  CL 103 98  CO2 22 32  GLUCOSE 161* 101*  BUN 13 10  CREATININE 0.86 0.92  CALCIUM 9.4 9.3   Liver Function Tests: No results for input(s): AST, ALT, ALKPHOS, BILITOT, PROT, ALBUMIN in the last 168 hours. No results for  input(s): LIPASE, AMYLASE in the last 168 hours. No results for input(s): AMMONIA in the last 168 hours. CBC:  Recent Labs Lab 02/15/15 0940  WBC 8.6  HGB 14.3  HCT 42.4  MCV 93.2  PLT 238   Cardiac Enzymes: No results for input(s): CKTOTAL, CKMB, CKMBINDEX, TROPONINI in the last 168 hours. BNP: BNP (last 3 results) No results for input(s): BNP in the last 8760 hours.  ProBNP (last 3 results) No results for input(s): PROBNP in the last 8760 hours.  CBG:  Recent Labs Lab 02/18/15 1448 02/18/15 1659 02/18/15 2148 02/19/15 0145 02/19/15 0715  GLUCAP 175* 149* 140* 155* 109*    Principal Problem:   Neoplasm of uncertain behavior of thyroid gland, right lobe    Signed:  Dariella Gillihan, ANP-BC

## 2015-02-19 NOTE — Discharge Instructions (Signed)
[facility]  THYROID & PARATHYROID SURGERY:  POST-OP INSTRUCTIONS  Always review your discharge instruction sheet from the facility where your surgery was performed.  A prescription for pain medication may be given to you upon discharge.  Take your pain medication as prescribed.  If narcotic pain medicine is not needed, then you may take acetaminophen (Tylenol) or ibuprofen (Advil) as needed.  Take your usually prescribed medications unless otherwise directed.  If you need a refill on your pain medication, please contact your pharmacy. They will contact our office to request authorization.  Prescriptions will not be processed after 5 pm or on weekends.  Start with a light diet upon arrival home, such as soup and crackers or toast.  Be sure to drink plenty of fluids daily.  Resume your normal diet the day after surgery.  Most patients will experience some swelling and bruising on the chest and neck area.  Ice packs will help.  Swelling and bruising can take several days to resolve.   It is common to experience some constipation after surgery.  Increasing fluid intake and taking a stool softener will usually help or prevent this problem.  A mild laxative (Milk of Magnesia or Miralax) should be taken according to package directions if there has been no bowel movement after 48 hours.  If you have steri-strips and a gauze dressing over your incision, you may remove the gauze bandage on the second day after surgery, and you may shower at that time.  Leave your steri-strips (small skin tapes) in place directly over the incision.  These strips should remain on the skin for 7-10 days and then be removed.  You may get them wet in the shower and pat them dry.  If you have Dermabond (topical glue) over your incision, you may shower at any time following your procedure.  Leave the glue in place for 10-14 days.  When it begins to flake off around the edges, you may remove it.  You may  resume regular (light) daily activities beginning the next day--such as daily self-care, walking, climbing stairs--gradually increasing activities as tolerated.  You may have sexual intercourse when it is comfortable.  Refrain from any heavy lifting or straining until approved by your doctor.  You may drive when you no longer are taking prescription pain medication, you can comfortably wear a seatbelt, and you can safely maneuver your car and apply brakes.  You should see your doctor in the office for a follow-up appointment approximately two to three weeks after your surgery.  Make sure that you call for this appointment within a day or two after you arrive home to insure a convenient appointment time.  WHEN TO CALL YOUR DOCTOR: -- Fever greater than 101.5 -- Inability to urinate -- Nausea and/or vomiting - persistent -- Extreme swelling or bruising -- Continued bleeding from incision -- Increased pain, redness, or drainage from the incision -- Difficulty swallowing or breathing -- Muscle cramping or spasms -- Numbness or tingling in hands or around lips  The clinic staff is available to answer your questions during regular business hours.  Please dont hesitate to call and ask to speak to one of the nurses if you have concerns.  [name], MD, [facility] Office: [phone]  Website: [email or web address]

## 2015-02-19 NOTE — Progress Notes (Signed)
Discharge instructions gone over with patient. Home medications gone over. Patient will take her medications due at 10:00 a.m., today,  at home.  Prescriptions given. Follow up appointment to be made. Signs and symptoms of tetany gone over. Diet, activity, and incisional care gone over. Reasons to call the doctor discussed. Patient verbalized understanding of instructions. She has My chart already established. Patient was discharged.

## 2015-02-20 ENCOUNTER — Encounter (HOSPITAL_COMMUNITY): Payer: Self-pay | Admitting: Surgery

## 2015-02-21 LAB — HEMOGLOBIN A1C
Hgb A1c MFr Bld: 7.7 % — ABNORMAL HIGH (ref 4.8–5.6)
Mean Plasma Glucose: 174 mg/dL

## 2015-03-01 ENCOUNTER — Other Ambulatory Visit (HOSPITAL_COMMUNITY): Payer: Self-pay | Admitting: Endocrinology

## 2015-03-01 ENCOUNTER — Other Ambulatory Visit: Payer: Self-pay | Admitting: Endocrinology

## 2015-03-01 DIAGNOSIS — C73 Malignant neoplasm of thyroid gland: Secondary | ICD-10-CM

## 2015-03-02 ENCOUNTER — Encounter (HOSPITAL_COMMUNITY)
Admission: RE | Admit: 2015-03-02 | Discharge: 2015-03-02 | Disposition: A | Discharge status: Home / Self Care | Payer: 59 | Source: Ambulatory Visit | Attending: Endocrinology | Admitting: Endocrinology

## 2015-03-02 DIAGNOSIS — C73 Malignant neoplasm of thyroid gland: Secondary | ICD-10-CM | POA: Insufficient documentation

## 2015-03-02 MED ORDER — THYROTROPIN ALFA 1.1 MG IM SOLR
0.9000 mg | INTRAMUSCULAR | Status: AC
Start: 2015-03-02 — End: 2015-03-02
  Administered 2015-03-02: 0.9 mg via INTRAMUSCULAR

## 2015-03-03 ENCOUNTER — Other Ambulatory Visit (HOSPITAL_COMMUNITY): Payer: Self-pay | Admitting: Endocrinology

## 2015-03-03 ENCOUNTER — Encounter (HOSPITAL_COMMUNITY)
Admission: RE | Admit: 2015-03-03 | Discharge: 2015-03-03 | Disposition: A | Discharge status: Home / Self Care | Payer: 59 | Source: Ambulatory Visit | Attending: Endocrinology | Admitting: Endocrinology

## 2015-03-03 DIAGNOSIS — C73 Malignant neoplasm of thyroid gland: Secondary | ICD-10-CM | POA: Diagnosis not present

## 2015-03-03 MED ORDER — THYROTROPIN ALFA 1.1 MG IM SOLR
0.9000 mg | INTRAMUSCULAR | Status: AC
Start: 2015-03-03 — End: 2015-03-03
  Administered 2015-03-03: 0.9 mg via INTRAMUSCULAR

## 2015-03-04 ENCOUNTER — Encounter (HOSPITAL_COMMUNITY)
Admission: RE | Admit: 2015-03-04 | Discharge: 2015-03-04 | Disposition: A | Discharge status: Home / Self Care | Payer: 59 | Source: Ambulatory Visit | Attending: Endocrinology | Admitting: Endocrinology

## 2015-03-04 DIAGNOSIS — C73 Malignant neoplasm of thyroid gland: Secondary | ICD-10-CM | POA: Diagnosis not present

## 2015-03-04 LAB — HCG, SERUM, QUALITATIVE: Preg, Serum: NEGATIVE

## 2015-03-04 MED ORDER — SODIUM IODIDE I 131 CAPSULE
77.5000 | Freq: Once | INTRAVENOUS | Status: AC | PRN
  Administered 2015-03-04: 77.5 via ORAL

## 2015-03-14 ENCOUNTER — Encounter (HOSPITAL_COMMUNITY)
Admission: RE | Admit: 2015-03-14 | Discharge: 2015-03-14 | Disposition: A | Discharge status: Home / Self Care | Payer: 59 | Source: Ambulatory Visit | Attending: Endocrinology | Admitting: Endocrinology

## 2015-03-14 DIAGNOSIS — C73 Malignant neoplasm of thyroid gland: Secondary | ICD-10-CM | POA: Diagnosis not present

## 2015-03-24 ENCOUNTER — Encounter (HOSPITAL_COMMUNITY): Payer: Self-pay

## 2015-03-24 ENCOUNTER — Ambulatory Visit (HOSPITAL_COMMUNITY)
Admission: RE | Admit: 2015-03-24 | Discharge: 2015-03-24 | Disposition: A | Discharge status: Home / Self Care | Payer: 59 | Source: Ambulatory Visit | Attending: Endocrinology | Admitting: Endocrinology

## 2015-03-24 ENCOUNTER — Other Ambulatory Visit (HOSPITAL_COMMUNITY): Payer: Self-pay | Admitting: Endocrinology

## 2015-03-24 DIAGNOSIS — R1112 Projectile vomiting: Secondary | ICD-10-CM | POA: Diagnosis not present

## 2015-03-24 HISTORY — DX: Malignant (primary) neoplasm, unspecified: C80.1

## 2015-03-24 MED ORDER — IOHEXOL 300 MG/ML  SOLN
100.0000 mL | Freq: Once | INTRAMUSCULAR | Status: AC | PRN
  Administered 2015-03-24: 100 mL via INTRAVENOUS

## 2015-04-13 ENCOUNTER — Ambulatory Visit: Payer: Self-pay | Admitting: Pharmacist

## 2015-04-13 ENCOUNTER — Ambulatory Visit (INDEPENDENT_AMBULATORY_CARE_PROVIDER_SITE_OTHER): Payer: Self-pay | Admitting: Family Medicine

## 2015-04-13 DIAGNOSIS — E118 Type 2 diabetes mellitus with unspecified complications: Secondary | ICD-10-CM

## 2015-04-13 NOTE — Progress Notes (Signed)
Subjective:  Patient is a 49 yo female with type 2 diabetes who presents today for 3 month follow-up as part of the employer-sponsored [facility] program. Current diabetes regimen includes Humulin U-500 via pump (approximately 60 units per day) and Invokamet 50-1000 mg twice daily.  Patient also continues on daily ASA, ACEi, and statin. Most recent MD follow-up was April 2016.  Patient has a pending appt for May 2016.    Patient has had significant health changes since last visit including a fall in late February injuring left knee, left ankle, and right wrist.   In March patient underwent thyroidectomy and has healed well, but is having a very difficult time regulating thyroid levels since the surgery.  She is working closely with Dr. Forde Dandy, endo to manage this issue.  Patient has had full work up, several dose adjustments to thryroid supplement, and a series of labs and continues to have dyspnea and tachycardia upon exertion, cramping severe at times, and severe and frequent nausea and vomiting.  She will follow-up with endo office again today.     Levothyroxine 88 mcg daily 3/11-3/23 Levothyroxine 125 mcg daily started 3/24 Increased to Levothyroxine 150 mcg (combination of Synthroid 125 mcg samples + Tirosint 25 mcg samples) Increased to levothyroxine 175 mcg (combination of Synthroid samples 125 mcg + Tirosint 50 mcg samples) Will start Levothyroxine 200 mcg on 5/9   Diabetes Assessment:  Changes to diabetes regimen include increased use of bolus insulin via pump as a result of uncontrolled glucose.  No changes to basal rates at this time given several other factors that could be affecting glucose readings.  Patient does maintain good medication compliance.  Most recent A1c was 7.1% (April 2016) which is slightly above goal of less than 7%.  Weight has increased slightly since last visit, likely due to patient holding several doses of furosemide.  Patient did not bring meter today but is  currently testing 1-6 times per day (4-6 times per day normally, but less often lately due to illness and patient lack of motivation).  We have discussed using CGM at this time to relieve burden of finger sticks and to improve glucose control.  Hypoglycemia is occassional and has been as low as 74.  Patient does demonstrate appropriate correction of hypoglycemia.  Glucose has been as high as 400s recently.  Patient reports signs and symptoms of neuropathy including numbness and tingling in hands and feet.  No sores at this time.    Lifestyle Assessment:  Diet - Less than optimal due to severe and frequent nausea vomitting and diarrhea since thyroidectomy.  This is being evaluated and patient is working with endo to regulate thyroid levels.  She will attempt to make healthy dietary choices despite these issues.    Exercise - Walking daily, takes breaks often, walks at slow pace due to recent surgery and unregulated thyroid.  Patient will continue and increase as tolerated.    Plan and Goals: 1)  Continue testing regularly, consider wearing CGM in an effort to reduce finger sticks and for better glucose control 2)  Speak with Dr. Forde Dandy or PA regarding switch to brand Synthroid and consider this option for next prescription 3)  Great job with exercise despite your thyroid issues, continue as tolerated 4)  Follow-up in 3 months on Wednesday August 10 @ 4:30 pm   Tilman Neat, PharmD [facility] [place]  [phone]

## 2015-04-13 NOTE — Patient Instructions (Signed)
1)  Continue testing regularly, consider wearing CGM in an effort to reduce finger sticks and for better glucose control 2)  Speak with Dr. Forde Dandy or PA regarding switch to brand Synthroid and consider this option for next prescription 3)  Great job with exercise despite your thyroid issues, continue as tolerated 4)  Follow-up in 3 months on Wednesday August 10 @ 4:30 pm  Great to see you today hope you feel better soon!

## 2015-04-21 NOTE — Progress Notes (Signed)
ATTENDING PHYSICIAN NOTE: I have reviewed the chart and agree with the plan as detailed above. Sopheap Basic MD Pager [phone]  

## 2015-05-18 ENCOUNTER — Encounter: Payer: Self-pay | Admitting: Podiatry

## 2015-05-18 ENCOUNTER — Ambulatory Visit (INDEPENDENT_AMBULATORY_CARE_PROVIDER_SITE_OTHER): Payer: 59

## 2015-05-18 ENCOUNTER — Ambulatory Visit (INDEPENDENT_AMBULATORY_CARE_PROVIDER_SITE_OTHER): Payer: 59 | Admitting: Podiatry

## 2015-05-18 VITALS — BP 128/76 | HR 95 | Resp 18

## 2015-05-18 DIAGNOSIS — R52 Pain, unspecified: Secondary | ICD-10-CM

## 2015-05-18 DIAGNOSIS — S93602A Unspecified sprain of left foot, initial encounter: Secondary | ICD-10-CM

## 2015-05-19 NOTE — Progress Notes (Signed)
Subjective:     Patient ID: [name], female   DOB: Feb 24, 1966, 49 y.o.   MRN: [id number]  HPI  This patient presents to my office with severe pain in her left foot.  She states she has been treated successfully by Dr. Blenda Mounts for closing of ulcer on her left foot.  She says her left foot is swollen and painful making it difficult to walk without a limp.  She has had history of multiple medical problems preventing her from returning to work for months and now her foot is painful.   Review of Systems     Objective:   Physical Exam Objective: Review of past medical history, medications, social history and allergies were performed.  Vascular: Dorsalis pedis and posterior tibial pulses were palpable B/L, capillary refill was  WNL B/L, temperature gradient wasdiminished and she has cold purplish color feet left foot.   Skin:  No signs of symptoms of infection or ulcers on both feet.  Previous ulcer has healed on big toe.  Nails: appear healthy with no signs of mycosis or infections  Sensory: Semmes Weinstein monifilament WNL   Orthopedic: Orthopedic evaluation demonstrates all joints distal to ankle have full ROM without crepitus, muscle power WNL B/L.  Patient has pain and swelling at the 4,5 metabase left foot.  Pain to her left heel is noted.      Assessment:     Foot Sprain     Plan:     ROV  X-ray taken .  Patient was given a cam walker and told her to wear the walker to help relieve her pain.

## 2015-06-06 ENCOUNTER — Other Ambulatory Visit: Payer: Self-pay

## 2015-06-06 ENCOUNTER — Ambulatory Visit: Payer: 59 | Admitting: Podiatry

## 2015-06-23 ENCOUNTER — Other Ambulatory Visit (HOSPITAL_COMMUNITY): Payer: Self-pay | Admitting: Orthopedic Surgery

## 2015-06-23 DIAGNOSIS — M25572 Pain in left ankle and joints of left foot: Secondary | ICD-10-CM

## 2015-07-04 ENCOUNTER — Ambulatory Visit (HOSPITAL_COMMUNITY)
Admission: RE | Admit: 2015-07-04 | Discharge: 2015-07-04 | Disposition: A | Discharge status: Home / Self Care | Payer: 59 | Source: Ambulatory Visit | Attending: Orthopedic Surgery | Admitting: Orthopedic Surgery

## 2015-07-04 DIAGNOSIS — M25572 Pain in left ankle and joints of left foot: Secondary | ICD-10-CM

## 2015-07-04 DIAGNOSIS — M79672 Pain in left foot: Secondary | ICD-10-CM | POA: Insufficient documentation

## 2015-07-04 DIAGNOSIS — M7989 Other specified soft tissue disorders: Secondary | ICD-10-CM | POA: Diagnosis not present

## 2015-07-20 ENCOUNTER — Ambulatory Visit: Payer: Self-pay | Admitting: Pharmacist

## 2015-07-21 ENCOUNTER — Other Ambulatory Visit: Payer: Self-pay

## 2015-07-21 HISTORY — PX: ESOPHAGOGASTRODUODENOSCOPY: SHX1529

## 2015-08-04 ENCOUNTER — Ambulatory Visit: Payer: Self-pay | Admitting: Pharmacist

## 2015-09-14 ENCOUNTER — Ambulatory Visit: Payer: Self-pay | Admitting: Pharmacist

## 2015-09-14 ENCOUNTER — Encounter: Payer: Self-pay | Admitting: Pharmacist

## 2015-09-14 VITALS — BP 122/80 | Wt 270.0 lb

## 2015-09-14 DIAGNOSIS — E118 Type 2 diabetes mellitus with unspecified complications: Secondary | ICD-10-CM

## 2015-09-14 NOTE — Progress Notes (Unsigned)
Subjective:  Patient is a 49 yo female with type 2 diabetes who presents today for 3 month follow-up as part of the employer-sponsored [facility] program. Current diabetes regimen includes Humulin U-500 via pump (approximately 60 units per day) and Invokamet 50-1000 mg twice daily.  Patient also continues on daily ASA, ACEi, and statin. Most recent MD follow-up was Oct 2016.    Most recent appt with Dr. Forde Dandy included labwork.  Pt was hypotensive that day resulting in instruction to reduce lisinopril from 40 mg twice daily to 20 mg once daily.   Patient has had significant health changes since last visit including a fall in late February injuring left knee, left ankle, and right wrist.   In March patient underwent thyroidectomy and had a very difficult time regulating thyroid levels but has since been able to get this under control with help of endo.   Patient has also had several issues, starting in Feb, with esophageal stricture requiring EGD and dilation twice, most recently in August.  Also, patient will be starting PT for ankle this month as a result of previous ankle injury and continued problems with this foot.    Levothyroxine 88 mcg daily 3/11-3/23 Levothyroxine 125 mcg daily started 3/24 Increased to Levothyroxine 150 mcg (combination of Synthroid 125 mcg samples + Tirosint 25 mcg samples) Increased to levothyroxine 175 mcg (combination of Synthroid samples 125 mcg + Tirosint 50 mcg samples) Started Levothyroxine 200 mcg on 5/9 Currently on Levothyroxine 125 mcg - two tabs daily  Physicians: Doran Durand (ortho), [place] (PCP and endo), [employer] (podiatrist), [place] (renal)  Lab Report as of 08/30/15: TC-164, TG-297, HDL-50, LDL-55 BUN-20, SCr-0.75, Na-138, CO2-27, Ca-10.1, AST and ALT-both elevated   Diabetes Assessment:     Changes to diabetes regimen include minor adjustment to basal dosing, no change to bolus dosing.  Patient does maintain good medication compliance.  Most  recent A1c was 7.5% (Oct 2016 with Dr. Forde Dandy) which is improved vs 7.8% previously, but remaining above goal of less than 7%.  Weight has remained unchanged since last visit.  Patient did not bring meter today but is currently testing 2-3 times per day on average.  She is testing less often because she is bolusing less often due to dietary changes.  Patient did try CGM for several days but had significant trouble keeping sensors in place and is working with DME supplier to get a new set.  Glucose is 100-170 on average per pt report. Hypoglycemia is occassional.  Patient does demonstrate appropriate correction of hypoglycemia.  Glucose was elevated significantly for approximately two weeks after steroid injection to injured foot/ankle, but this has since resolved.  Patient reports signs and symptoms of neuropathy including numbness and tingling in hands and feet.  She continues to struggle with neuropathy in feet.  Recently suffered a left great toe injury but this has healed appropriately.  She does have a split in her left food under next to last toe due to difficulty walking on this injured foot.  She is being followed by ortho and podiatry.    Lifestyle Assessment:  Diet - Less than optimal due to significant taste disturbances that have developed recently along with esophageal stricture/dilation.  She has been on a largely "soft foods" diet due to esophageal issues, but has recently started trying more solid food (since dilation).  She also has significant taste disturbances and reports an ash tray taste accompanied by texture issues with certain meats that she cannot tolerate.  She is  supplementing with diabetic friendly protein shakes.    Exercise - Pt is on walking restrictions due to multiple issues with left foot/ankle.  She is allowed to use a pedal machine.  She is also using exercise band and chair exercises.  She is unable to tolerate much activity due to lack of stamina, but she is hoping this  will be improved as ankle heals and she does PT.     Plan and Goals: 1)  Continue testing regularly, consider wearing CGM in an effort to reduce finger sticks and for better glucose control 2)  Great job with exercise despite your thyroid issues, continue as tolerated 3)  Continue to work toward a healthy diet 4)  Follow-up in 3 months on Wednesday January 11th @ 10:00 am  Great to see you today!  Tilman Neat, PharmD [facility]  [phone]

## 2015-09-14 NOTE — Patient Instructions (Signed)
1)  Continue testing regularly, consider wearing CGM in an effort to reduce finger sticks and for better glucose control 2)  Great job with exercise despite your thyroid issues, continue as tolerated 3)  Continue to work toward a healthy diet 4)  Follow-up in 3 months on Wednesday January 11th @ 10:00 am  Great to see you today!

## 2015-09-26 ENCOUNTER — Ambulatory Visit (INDEPENDENT_AMBULATORY_CARE_PROVIDER_SITE_OTHER): Payer: 59 | Admitting: Podiatry

## 2015-09-26 ENCOUNTER — Encounter: Payer: Self-pay | Admitting: Podiatry

## 2015-09-26 VITALS — BP 105/67 | HR 83 | Resp 14

## 2015-09-26 DIAGNOSIS — L97521 Non-pressure chronic ulcer of other part of left foot limited to breakdown of skin: Secondary | ICD-10-CM | POA: Diagnosis not present

## 2015-09-26 NOTE — Progress Notes (Signed)
Patient ID: [name], female   DOB: 01/20/1966, 49 y.o.   MRN: [id number]  Subjective: 49 year old female presents the office today for concerns of a possible wound the bottom of her left big toe couple weeks. She is unsure of how the wound started. She denies any surrounding redness or red streaks. Denies any drainage or purulence. At the last appointment she also has followed up with Dr. Doran Durand for left foot pain. She will be starting physical therapy today. She is still under the treatment Dr. Doran Durand for the left foot pain which is significantly improved she states. No other complaints at this time in no acute changes since last appointment.  Objective: AAO 3, NAD DP pulses palpable 2/4, PT pulses 1/4, CRT less than 3 seconds Protective sensation decreased with Sims once the monofilament On the plantar aspect of left hallux is a superficial abrasion-type lesion with a hyperkeratotic tissue surrounding the area. Upon debridement there is no drainage or purulence. There is no swelling erythema. There is no probing, undermining, tunneling the wound appears to be very superficial. There are areas of fluctuance or crepitus. No malodor. No other open lesions or pre-ulcer lesions identified bilaterally. The left hallux toenail has pretty fallen off his scabs overlying the area. The nail on the right hallux toenail is very short. The nails otherwise appear to be well manicured and there is no surrounding redness or drainage. No areas of tenderness to bilateral lower extremity. No overlying edema, erythema, increase in warmth. No calf Compression, swelling, warmth, erythema.  Assessment: 49 year old female left plantar hallux ulceration, superficial without signs of infection  Plan: -Treatment options discussed including all alternatives, risks, and complications -Lesion debridement of hyperkeratotic tissue plantar left hallux. Continue with antibiotic ointment dressing changes daily.  Monitor for any clinical signs or symptoms of infection and directed to call the office immediately should any occur or go to the ER. -continue follow Dr. Doran Durand in physical therapy for left foot pain as she is currently under the treatment of them. -Follow-up in 3 weeks for wound check or sooner if any problems arise. In the meantime, encouraged to call the office with any questions, concerns, change in symptoms.   Celesta Gentile, DPM

## 2015-10-03 ENCOUNTER — Ambulatory Visit: Payer: 59 | Attending: Orthopedic Surgery | Admitting: Physical Therapy

## 2015-10-03 DIAGNOSIS — M25572 Pain in left ankle and joints of left foot: Secondary | ICD-10-CM | POA: Insufficient documentation

## 2015-10-03 DIAGNOSIS — M79672 Pain in left foot: Secondary | ICD-10-CM | POA: Diagnosis present

## 2015-10-03 DIAGNOSIS — R296 Repeated falls: Secondary | ICD-10-CM | POA: Diagnosis present

## 2015-10-03 DIAGNOSIS — R262 Difficulty in walking, not elsewhere classified: Secondary | ICD-10-CM | POA: Insufficient documentation

## 2015-10-03 DIAGNOSIS — Z9181 History of falling: Secondary | ICD-10-CM

## 2015-10-03 NOTE — Patient Instructions (Signed)
Plantar Flexion: Isometric   Press left foot into ball or rolled pillow against wall. Hold _5___ seconds. Relax. Repeat __10__ times per set. Do __2__ sets per session. Do _2-3__ sessions per day.  http://orth.exer.us/0   Inversion: Isometric   Press inner borders of feet into ball or rolled pillow between feet. Hold _5___ seconds. Relax. Repeat _10___ times per set. Do _2___ sets per session. Do __2-3__ sessions per day.  http://orth.exer.us/6   Pillow Squeeze (Isometric Dorsiflexion)   Lying with pillow between feet, one foot on top, squeeze feet together, bringing bottom foot up while pushing top one down. Hold _5___ seconds. Repeat with other foot on top. Repeat _10 x 2___ times. Do _2-3___ sessions per day.  http://[email or web address].exer.us/421   Eversion: Isometric   Press outer border of right foot into ball or rolled pillow against wall. Hold __5__ seconds. Relax. Repeat __10__ times per set. Do 2___ sets per session. Do __2-3__ sessions per day.  http://orth.exer.us/4   Copyright  VHI. All rights reserved.  Use cane to walk for safety and to decrease risk of fall on left week ankle,  Gastroc / Heel Cord Stretch - Seated With Towel   Sit on floor, towel around ball of foot. Gently pull foot in toward body, stretching heel cord and calf. Hold for 30-60___ seconds. Repeat on involved leg. Repeat _2-3__ times. Do 2-3__ times per day.        Voncille Lo, PT 10/03/2015 12:17 PM Phone: [phone] Fax: [phone]

## 2015-10-03 NOTE — Therapy (Signed)
[place], [place], [address] Phone: [phone]   Fax:  [phone]  Physical Therapy Evaluation  Patient Details  Name: [name] MRN: [id number] Date of Birth: 04-01-1966 Referring Provider: Wylene Simmer MD  Encounter Date: 10/03/2015      PT End of Session - 10/03/15 1228    Visit Number 1   Number of Visits 16   Date for PT Re-Evaluation 11/28/15   Authorization Type UMR   PT Start Time 1140   PT Stop Time 1230   PT Time Calculation (min) 50 min   Activity Tolerance Patient tolerated treatment well   Behavior During Therapy [facility] for tasks assessed/performed      Past Medical History  Diagnosis Date  . Hypertension   . GERD (gastroesophageal reflux disease)   . Fibromyalgia   . Proteinuria   . PONV (postoperative nausea and vomiting)   . Anginal pain ([place])     chest pain  heart cath done  . History of hiatal hernia   . Kidney stones   . High cholesterol   . Chronic bronchitis ([place])     "get it q yr"  . Hypothyroidism   . Type II diabetes mellitus ([place])   . Iron deficiency anemia     "comes and goes"  . Migraine     "@ least once/month" (02/18/2015)  . History of gout   . Cancer [facility])     Past Surgical History  Procedure Laterality Date  . Lumbar disc surgery  08/2011  . Colonoscopy    . Esophagogastroduodenoscopy    . Total thyroidectomy  02/18/2015  . Back surgery    . Cystoscopy with ureteroscopy, stone basketry and stent placement  ~ 2006  . Abdominal hysterectomy  ~2012    "lap"  . Cardiac catheterization  04/2009    "clean"  . Laparoscopic cholecystectomy  05/2004  . Thyroidectomy N/A 02/18/2015    Procedure: TOTAL THYROIDECTOMY;  Surgeon: [name], MD;  Location: [facility];  Service: General;  Laterality: N/A;    There were no vitals filed for this visit.  Visit Diagnosis:  Pain in left foot  Left ankle pain  Difficulty walking  Falls infrequently -  due to left ankle instablity      Subjective Assessment - 10/03/15 1143    Subjective I originally felll the last weekend in February and hurtt  right hand and left ankle/foot.  I have had thyroidectomy ( February 18, 2015) in between and regulating thyroid.  I was cleared to walk by Dr. Alfonso Ramus.   I was trying to walk for 1 and 1/2 mile.  but I  keep tripping in my home because my ankle rolls over.  I am weak   Pertinent History DM, thyroidectomy in March 2016, HBP, GERD, Fibromyalgia   Limitations Walking;Standing;House hold activities;Other (comment)  working as a Marine scientist,  Inpatient rehab   How long can you sit comfortably? unlimited   How long can you stand comfortably? 15 min   How long can you walk comfortably? 15 min   Patient Stated Goals walk without pain, return to work,  start exercise.   Currently in Pain? Yes   Pain Score 5    Pain Location Ankle   Pain Orientation Left   Pain Descriptors / Indicators Aching;Sore;Throbbing  spongy feeling   Pain Type Chronic pain   Pain Onset Other (comment)  February 2016   Aggravating Factors  standing. moving   Pain Relieving  Factors elevate, ice   Multiple Pain Sites Yes   Pain Score 6   Pain Location Foot  ball of foot   Pain Orientation Left   Pain Descriptors / Indicators Aching;Sharp   Pain Type Chronic pain   Pain Onset More than a month ago   Aggravating Factors  stand up , transfers            [facility] PT Assessment - 10/03/15 1147    Assessment   Medical Diagnosis Left ankle instability and pain and synovitis.   also left great toe laceration of great toe   Referring Provider hewitt, John MD   Onset Date/Surgical Date 02/05/15  begain walking for exercise June and foot pain increased   Hand Dominance Right   Prior Therapy none  Pt initially had eval at [facility]   Precautions   Precautions Fall   Required Braces or Orthoses Other Brace/Splint   Other Brace/Splint soft high ankle brace   Restrictions    Weight Bearing Restrictions No   Balance Screen   Has the patient fallen in the past 6 months Yes  last time on wednesday and rolled ankle   How many times? 3   Has the patient had a decrease in activity level because of a fear of falling?  No   Is the patient reluctant to leave their home because of a fear of falling?  No   Home Environment   Living Environment Private residence   Living Arrangements Spouse/significant other   Home Access Stairs to enter   Entrance Stairs-Number of Steps 3  husband stands with her to go up and down steps.   Entrance Stairs-Rails None   Home Layout One level   Prior Function   Level of Independence Independent   Cognition   Overall Cognitive Status Within Functional Limits for tasks assessed   Observation/Other Assessments   Skin Integrity Laceration on left big toe from  walking    Lower Extremity Functional Scale  67% limitation   Observation/Other Assessments-Edema    Edema Circumferential   around heads of metatarsal   Circumferential Edema   Circumferential - Right 22 cm   Circumferential - Left  24 cm   Posture/Postural Control   Posture Comments pes planus   AROM   Right Ankle Dorsiflexion 9   Right Ankle Plantar Flexion 55   Right Ankle Inversion 21   Right Ankle Eversion 15   Left Ankle Dorsiflexion -10   Left Ankle Plantar Flexion 39   Left Ankle Inversion 21   Left Ankle Eversion 10   Strength   Right Hip Flexion 4-/5   Right Hip ABduction 4-/5   Left Hip Flexion 4-/5   Left Hip ABduction 4-/5   Left Ankle Dorsiflexion 4/5   Left Ankle Plantar Flexion 2/5   Palpation   Palpation comment Pt with tenderness over metatarsal head of 4th and 5th digits.     Transfers   Transfers Sit to Stand   Sit to Stand With upper extremity assist;6: Modified independent (Device/Increase time)   Ambulation/Gait   Assistive device None   Gait Pattern Decreased weight shift to left;Antalgic   Ambulation Surface Level   Gait velocity 1.76  ft/sec    Pre-Gait Activities SLS  Right 12/20, Left 0/20                   [facility] Adult PT Treatment/Exercise - 10/03/15 1147    Knee/Hip Exercises: Stretches   Other Knee/Hip Stretches Dorsiflexion  with towel in long sitting for increased dorsiflexion   Ankle Exercises: Supine   Isometrics All planes with ball/pillow 10 x each                PT Education - 10/03/15 1231    Education provided Yes   Education Details Pt educated on POC, explanation of finding.  use of cane necessity for prevention of falls, and HEP for ankle isometrics   Person(s) Educated Patient   Methods Explanation;Demonstration;Verbal cues;Handout   Comprehension Verbalized understanding;Returned demonstration          PT Short Term Goals - 10/03/15 1815    PT SHORT TERM GOAL #1   Title Pt will be independent with initial HEP for ankle    Time 4   Period Weeks   Status New   PT SHORT TERM GOAL #2   Title Pt will utilize a straight cane for safety with ambulation to reduce falls to zero   Time 4   Period Weeks   Status New   PT SHORT TERM GOAL #3   Title Pt will be able to regain AROM of Left dorsiflexion to begin standing strengthening exercises/ weight shifting with UE support   Time 4   Period Weeks   Status New           PT Long Term Goals - 10/03/15 1816    PT LONG TERM GOAL #1   Title Pt will be independent with advanced HEP to address core and LE strength   Time 8   Period Weeks   Status New   PT LONG TERM GOAL #2   Title Pt will reduce LEFS limiation from 67 % to at leat 35% or less   Time 8   Period Weeks   Status New   PT LONG TERM GOAL #3   Title Pt will be able to perform heel raises bilaterally 25 /25 to show increase Plantarflexion strength   Time 8   Period Weeks   Status New   PT LONG TERM GOAL #4   Title Pt will be 2/10 or less in left lower extremity for all functional activities.   Time 8   Period Weeks   Status New   PT LONG TERM GOAL #5    Title Pt will negotiate steps independently without use of assistive device   Time 8   Period Weeks   PT LONG TERM GOAL #6   Title Pt will be able to return to walking program for 1 mile in order to return to work as rehabilitation nurse   Time 8   Period Weeks   Status New   PT LONG TERM GOAL #7   Title Pt will utilized orthotic to improve pes planus and provide at least 50% increased comfort with standing activites for longer than 1 hour   Time 8   Period Weeks   Status New               Plan - 10/03/15 1228    Clinical Impression Statement 49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  Pt is morbidly obese and had fall in February 2016 and thyroidectomy in March 2016 .  Pt began walking program for weight loss and then developed friction sore on Great toe and  pain in  metatarsal heads of left 4th and 5th digits. Pt  presents with decreased AROM of left ankle as well as strtength deficits.  Pt has received on  cortisone injection and recently bought  studier shoes with better arch support.  Pt may benefti from  more solid orthotic if over the counter versions do not  maiintain arch for pes planus bil feet.  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  Pt states she fell last week when  left ankle " rolled over "as she was walking.  Pt  demonstrates core weakness and LE deconditioning due to time in CAM walking boot  x 2  and hard cast. as well as ankle instability and pain.  Pt will benefit  from skilled PT to address these deficits and use modalities, manual therapy to address pain issues    Pt will benefit from skilled therapeutic intervention in order to improve on the following deficits Abnormal gait;Decreased balance;Decreased mobility;Decreased endurance;Decreased range of motion;Decreased  strength;Increased edema;Difficulty walking;Increased fascial restricitons;Postural dysfunction;Improper body mechanics;Pain;Obesity   Rehab Potential Good   PT Frequency 2x / week   PT Duration 8 weeks   PT Treatment/Interventions ADLs/Self Care Home Management;Cryotherapy;Electrical Stimulation;Iontophoresis 4mg /ml Dexamethasone;Moist Heat;Therapeutic exercise;Functional mobility training;Stair training;Gait training;Ultrasound;Neuromuscular re-education;Patient/family education;Orthotic Fit/Training;Manual techniques;Taping;Dry needling   PT Next Visit Plan progress ankle stability in standing and holdin onto counter for safety.   PT Home Exercise Plan Isometric exercise,  use a cane for walking    Consulted and Agree with Plan of Care Patient         Problem List Patient Active Problem List   Diagnosis Date Noted  . Neoplasm of uncertain behavior of thyroid gland, right lobe 02/18/2015  . DIABETES MELLITUS, TYPE II 04/14/2009  . DIABETES MELLITUS, WITH RENAL COMPLICATIONS 67/89/3810  . HYPERLIPIDEMIA 04/14/2009  . OBESITY 04/14/2009  . ANXIETY DEPRESSION 04/14/2009  . HYPERTENSION 04/14/2009  . COPD 04/14/2009  . CHEST PAIN 04/14/2009  . TACHYCARDIA, HX OF 04/14/2009    Voncille Lo, PT 10/03/2015 6:22 PM Phone: [phone] Fax: [phone]  By signing I understand that I am ordering/authorizing the use of Iontophoresis using 4 mg/mL of dexamethasone as a component of this plan of care. [place], [place], [address] Phone: [phone]   Fax:  [phone]  Name: [name] MRN: [id number] Date of Birth: Sep 19, 1966

## 2015-10-06 ENCOUNTER — Ambulatory Visit: Payer: 59 | Admitting: Physical Therapy

## 2015-10-06 DIAGNOSIS — M25572 Pain in left ankle and joints of left foot: Secondary | ICD-10-CM

## 2015-10-06 DIAGNOSIS — R262 Difficulty in walking, not elsewhere classified: Secondary | ICD-10-CM

## 2015-10-06 DIAGNOSIS — M79672 Pain in left foot: Secondary | ICD-10-CM

## 2015-10-06 DIAGNOSIS — Z9181 History of falling: Secondary | ICD-10-CM

## 2015-10-06 NOTE — Therapy (Signed)
[name], [place], [address] Phone: [phone]   Fax:  [phone]  Physical Therapy Treatment  Patient Details  Name: [name] MRN: [id number] Date of Birth: [birth year] Referring Provider: Wylene Simmer MD  Encounter Date: 10/06/2015      PT End of Session - 10/06/15 1229    Visit Number 2   Number of Visits 16   Date for PT Re-Evaluation 11/28/15   Authorization Type UMR   PT Start Time 1147   PT Stop Time 1230   PT Time Calculation (min) 43 min   Activity Tolerance Patient tolerated treatment well   Behavior During Therapy [name] for tasks assessed/performed      Past Medical History  Diagnosis Date  . Hypertension   . GERD (gastroesophageal reflux disease)   . Fibromyalgia   . Proteinuria   . PONV (postoperative nausea and vomiting)   . Anginal pain ([place])     chest pain  heart cath done  . History of hiatal hernia   . Kidney stones   . High cholesterol   . Chronic bronchitis ([place])     "get it q yr"  . Hypothyroidism   . Type II diabetes mellitus ([place])   . Iron deficiency anemia     "comes and goes"  . Migraine     "@ least once/month" (02/18/2015)  . History of gout   . Cancer [facility])     Past Surgical History  Procedure Laterality Date  . Lumbar disc surgery  08/2011  . Colonoscopy    . Esophagogastroduodenoscopy    . Total thyroidectomy  02/18/2015  . Back surgery    . Cystoscopy with ureteroscopy, stone basketry and stent placement  ~ 2006  . Abdominal hysterectomy  ~2012    "lap"  . Cardiac catheterization  04/2009    "clean"  . Laparoscopic cholecystectomy  05/2004  . Thyroidectomy N/A 02/18/2015    Procedure: TOTAL THYROIDECTOMY;  Surgeon: [name], MD;  Location: [place];  Service: General;  Laterality: N/A;    There were no vitals filed for this visit.  Visit Diagnosis:  Pain in left foot  Left ankle pain  Difficulty walking  Falls infrequently       Subjective Assessment - 10/06/15 1149    Subjective Pt presents to clinic with cane and wearing high ankle brace   Pertinent History DM, thyroidectomy in March 2016, HBP, GERD, Fibromyalgia   Limitations Walking;Standing;House hold activities;Other (comment)   How long can you sit comfortably? unlimited   How long can you stand comfortably? 15 min   How long can you walk comfortably? 15 min   Patient Stated Goals walk without pain, return to work,  start exercise.   Pain Score 5    Pain Location Ankle   Pain Orientation Left   Pain Descriptors / Indicators Aching;Sore;Throbbing   Pain Type Chronic pain   Pain Score 3   Pain Location Foot   Pain Orientation Left   Pain Descriptors / Indicators Aching;Sharp   Pain Type Chronic pain   Pain Onset More than a month ago                         [facility] Adult PT Treatment/Exercise - 10/06/15 1205    Ambulation/Gait   Stairs Yes   Stairs Assistance 6: Modified independent (Device/Increase time)   Number of Stairs 4   Height of Stairs 6   Gait Comments  instruction on 6 inch step with gait education with straight point cane   Lumbar Exercises: Supine   Clam 15 reps   Clam Limitations with red t band    Bridge 15 reps   Bridge Limitations Pt unable to lift full AROM in supine   Other Supine Lumbar Exercises supine hooklying  ball squeeze with proper breathing/ transabdomiis x 15   Knee/Hip Exercises: Stretches   Other Knee/Hip Stretches Dorsiflexion with towel in long sitting for increased dorsiflexion   Knee/Hip Exercises: Standing   Lateral Step Up 15 reps;Hand Hold: 2  hand hold at counter   Lateral Step Up Limitations VC for technique   Forward Step Up 15 reps;Hand Hold: 2   Forward Step Up Limitations VC for technique   Other Standing Knee Exercises wt shifting side to side and diagonal x15   Other Standing Knee Exercises sit to stand with limited UE support x 15 chair in front.    Ankle Exercises: Standing    Heel Raises 15 reps  bil hand hold   Heel Raises Limitations pt raises 1/4 inche only   Toe Raise 15 reps  bil                PT Education - 10/06/15 1225    Education provided Yes   Education Details progress exercise, gait training for stairs and cane    Person(s) Educated Patient   Methods Explanation;Demonstration;Verbal cues;Handout   Comprehension Verbalized understanding;Returned demonstration          PT Short Term Goals - 10/06/15 1148    PT SHORT TERM GOAL #1   Title Pt will be independent with initial HEP for ankle    Time 4   Period Weeks   Status On-going   PT SHORT TERM GOAL #2   Title Pt will utilize a straight cane for safety with ambulation to reduce falls to zero   Time 4   Period Weeks   Status On-going   PT SHORT TERM GOAL #3   Title Pt will be able to regain AROM of Left dorsiflexion to begin standing strengthening exercises/ weight shifting with UE support   Time 4   Period Weeks   Status On-going           PT Long Term Goals - 10/06/15 1148    PT LONG TERM GOAL #1   Title Pt will be independent with advanced HEP to address core and LE strength   Time 8   Period Weeks   Status On-going   PT LONG TERM GOAL #2   Title Pt will reduce LEFS limiation from 67 % to at leat 35% or less   Time 8   Period Weeks   Status On-going   PT LONG TERM GOAL #3   Title Pt will be able to perform heel raises bilaterally 25 /25 to show increase Plantarflexion strength   Time 8   Period Weeks   Status On-going   PT LONG TERM GOAL #4   Title Pt will be 2/10 or less in left lower extremity for all functional activities.   Time 8   Period Weeks   Status On-going   PT LONG TERM GOAL #5   Title Pt will negotiate steps independently without use of assistive device   Time 8   Period Weeks   Status On-going   PT LONG TERM GOAL #6   Title Pt will be able to return to walking program for 1 mile in order to  return to work as rehabilitation nurse   Time  8   Period Weeks   Status On-going   PT [place] #7   Title Pt will utilized orthotic to improve pes planus and provide at least 50% increased comfort with standing activites for longer than 1 hour   Time 8   Period Weeks   Status On-going               Plan - 10/06/15 1202    Clinical Impression Statement Pt returns after evaluation and ankle 5/10 and foot 3/10.  Pt using straight cane and using high ankle brace.  Pt is able to rise from sitting with reduced UE support but does need it.  Pt educated on gait for stairs with cane.  Pt demonstrates poor core control and unable to perform bridge in supine for full  AROM.  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   Pt will benefit from skilled therapeutic intervention in order to improve on the following deficits Abnormal gait;Decreased balance;Decreased mobility;Decreased endurance;Decreased range of motion;Decreased strength;Increased edema;Difficulty walking;Increased fascial restricitons;Postural dysfunction;Improper body mechanics;Pain;Obesity   Rehab Potential Good   PT Frequency 2x / week   PT Duration 8 weeks   PT Next Visit Plan Progress Core exercises and ankle stability exercise   PT Home Exercise Plan bridging, supine clamshell, review home exercises   Consulted and Agree with Plan of Care Patient        Problem List Patient Active Problem List   Diagnosis Date Noted  . Neoplasm of uncertain behavior of thyroid gland, right lobe 02/18/2015  . DIABETES MELLITUS, TYPE II 04/14/2009  . DIABETES MELLITUS, WITH RENAL COMPLICATIONS 78/67/5449  . HYPERLIPIDEMIA 04/14/2009  . OBESITY 04/14/2009  . ANXIETY DEPRESSION 04/14/2009  . HYPERTENSION 04/14/2009  . COPD 04/14/2009  . CHEST PAIN 04/14/2009  . TACHYCARDIA, HX OF 04/14/2009  Voncille Lo, PT 10/06/2015 1:08 PM Phone: [phone] Fax: [facility] [place], [place], [address] Phone: [phone]   Fax:  [phone]  Name: [name] MRN: [id number] Date of Birth: 06/13/1966

## 2015-10-06 NOTE — Patient Instructions (Signed)
Pelvic Squeeze / Protected Hip: Adduction / Abduction    Adduct( bring hips together and squeeze ball 15 x , then push knees out with red t band 15 x.  Bring belly button to spine and exhale to engage abdominals. Hold _3__ seconds. Repeat _15__ times. Do 1-2___ sessions per day.  Copyright  VHI. All rights reserved.  Bridge    Lie back, legs bent. Inhale, pressing hips up. Keeping ribs in, lengthen lower back. Exhale, rolling down along spine from top. Repeat _15 x2___ times. Do __1-2__ sessions per day.  http://pm.exer.us/55   Copyright  VHI. All rights reserved.   Voncille Lo, PT 10/06/2015 12:25 PM Phone: [phone] Fax: [phone]

## 2015-10-14 ENCOUNTER — Ambulatory Visit: Payer: 59 | Attending: Orthopedic Surgery | Admitting: Physical Therapy

## 2015-10-14 DIAGNOSIS — Z9181 History of falling: Secondary | ICD-10-CM

## 2015-10-14 DIAGNOSIS — M25572 Pain in left ankle and joints of left foot: Secondary | ICD-10-CM | POA: Diagnosis present

## 2015-10-14 DIAGNOSIS — M79672 Pain in left foot: Secondary | ICD-10-CM | POA: Insufficient documentation

## 2015-10-14 DIAGNOSIS — R262 Difficulty in walking, not elsewhere classified: Secondary | ICD-10-CM | POA: Insufficient documentation

## 2015-10-14 DIAGNOSIS — R600 Localized edema: Secondary | ICD-10-CM | POA: Insufficient documentation

## 2015-10-14 DIAGNOSIS — R296 Repeated falls: Secondary | ICD-10-CM | POA: Diagnosis present

## 2015-10-14 NOTE — Patient Instructions (Signed)
Weight shift forward and back/ side to side  X 10 2 x per day Perform circles, DF/PF, circles each way x 10 each, 2 x per day

## 2015-10-14 NOTE — Therapy (Signed)
[place], [place], [address] Phone: [phone]   Fax:  [phone]  Physical Therapy Treatment  Patient Details  Name: [name] MRN: [id number] Date of Birth: 03/08/1966 Referring Provider: Wylene Simmer MD  Encounter Date: 10/14/2015      PT End of Session - 10/14/15 1037    Visit Number 3   Number of Visits 16   Date for PT Re-Evaluation 11/28/15   PT Start Time 0930   PT Stop Time 1030   PT Time Calculation (min) 60 min      Past Medical History  Diagnosis Date  . Hypertension   . GERD (gastroesophageal reflux disease)   . Fibromyalgia   . Proteinuria   . PONV (postoperative nausea and vomiting)   . Anginal pain ([place])     chest pain  heart cath done  . History of hiatal hernia   . Kidney stones   . High cholesterol   . Chronic bronchitis ([place])     "get it q yr"  . Hypothyroidism   . Type II diabetes mellitus ([place])   . Iron deficiency anemia     "comes and goes"  . Migraine     "@ least once/month" (02/18/2015)  . History of gout   . Cancer [facility])     Past Surgical History  Procedure Laterality Date  . Lumbar disc surgery  08/2011  . Colonoscopy    . Esophagogastroduodenoscopy    . Total thyroidectomy  02/18/2015  . Back surgery    . Cystoscopy with ureteroscopy, stone basketry and stent placement  ~ 2006  . Abdominal hysterectomy  ~2012    "lap"  . Cardiac catheterization  04/2009    "clean"  . Laparoscopic cholecystectomy  05/2004  . Thyroidectomy N/A 02/18/2015    Procedure: TOTAL THYROIDECTOMY;  Surgeon: [name], MD;  Location: [place];  Service: General;  Laterality: N/A;    There were no vitals filed for this visit.  Visit Diagnosis:  No diagnosis found.      Subjective Assessment - 10/14/15 0937    Subjective Hurts a lot when I do exercises without brace. Thinks that her brace is causing the open places. Patient is very frustrated with her progress and  upset that her ankle won't do what she wants it to do. She has a blister on medial distal leg as well as on open wound on lateral side. She will see MD monday to discuss other bracing options.    Pain Score 3    Pain Location Ankle   Pain Orientation Left   Pain Descriptors / Indicators Aching   Aggravating Factors  standing   Pain Relieving Factors ice, elevate                         [facility] Adult PT Treatment/Exercise - 10/14/15 1139    Knee/Hip Exercises: Stretches   Other Knee/Hip Stretches Passive ankle stretching 4 way   Knee/Hip Exercises: Standing   Forward Step Up 15 reps;Hand Hold: 2   Other Standing Knee Exercises wt shifting side to side and forward and back   Modalities   Modalities Cryotherapy   Cryotherapy   Number Minutes Cryotherapy 15 Minutes   Cryotherapy Location Ankle   Type of Cryotherapy Ice pack  attempted vaso, did not feel cold so DC   Ankle Exercises: Standing   Heel Raises 15 reps  bil hand hold   Toe Raise 15  reps  bil   Ankle Exercises: Seated   Ankle Circles/Pumps AROM;Both;15 reps   BAPS Sitting;Level 2   BAPS Limitations tactile cues required                PT Education - 10/14/15 1152    Education provided Yes   Education Details Weight shifts, AROM   Person(s) Educated Patient   Methods Explanation   Comprehension Verbalized understanding;Returned demonstration          PT Short Term Goals - 10/06/15 1148    PT SHORT TERM GOAL #1   Title Pt will be independent with initial HEP for ankle    Time 4   Period Weeks   Status On-going   PT SHORT TERM GOAL #2   Title Pt will utilize a straight cane for safety with ambulation to reduce falls to zero   Time 4   Period Weeks   Status On-going   PT SHORT TERM GOAL #3   Title Pt will be able to regain AROM of Left dorsiflexion to begin standing strengthening exercises/ weight shifting with UE support   Time 4   Period Weeks   Status On-going           PT  Long Term Goals - 10/06/15 1148    PT LONG TERM GOAL #1   Title Pt will be independent with advanced HEP to address core and LE strength   Time 8   Period Weeks   Status On-going   PT LONG TERM GOAL #2   Title Pt will reduce LEFS limiation from 67 % to at leat 35% or less   Time 8   Period Weeks   Status On-going   PT LONG TERM GOAL #3   Title Pt will be able to perform heel raises bilaterally 25 /25 to show increase Plantarflexion strength   Time 8   Period Weeks   Status On-going   PT LONG TERM GOAL #4   Title Pt will be 2/10 or less in left lower extremity for all functional activities.   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 She reports difficulty with isometrics. Instructed pt in AROM exercises with verbal  and tactile cues. Also instucted pt in standing weight shifting side to side and forward and back with good DF/PF ROM. Advised pt to continue AROM and weight shifting as part of HEP.    PT Next Visit Plan Pt may be ready for 4 way ankle, continue standing exercises as tolerated.         Problem List Patient  Active Problem List   Diagnosis Date Noted  . Neoplasm of uncertain behavior of thyroid gland, right lobe 02/18/2015  . DIABETES MELLITUS, TYPE II 04/14/2009  . DIABETES  MELLITUS, WITH RENAL COMPLICATIONS 42/87/6811  . HYPERLIPIDEMIA 04/14/2009  . OBESITY 04/14/2009  . ANXIETY DEPRESSION 04/14/2009  . HYPERTENSION 04/14/2009  . COPD 04/14/2009  . CHEST PAIN 04/14/2009  . TACHYCARDIA, HX OF 04/14/2009    Dorene Ar, PTA 10/14/2015, 11:54 AM  [facility] [address] [place], [place], [address] Phone: [phone]   Fax:  [phone]  Name: [name] MRN: [id number] Date of Birth: 1966-02-19

## 2015-10-17 ENCOUNTER — Ambulatory Visit (INDEPENDENT_AMBULATORY_CARE_PROVIDER_SITE_OTHER): Payer: 59 | Admitting: Podiatry

## 2015-10-17 ENCOUNTER — Encounter: Payer: Self-pay | Admitting: Podiatry

## 2015-10-17 ENCOUNTER — Ambulatory Visit: Payer: 59 | Admitting: Physical Therapy

## 2015-10-17 VITALS — BP 114/63 | HR 88 | Resp 18

## 2015-10-17 DIAGNOSIS — E1140 Type 2 diabetes mellitus with diabetic neuropathy, unspecified: Secondary | ICD-10-CM | POA: Diagnosis not present

## 2015-10-17 DIAGNOSIS — Z9181 History of falling: Secondary | ICD-10-CM

## 2015-10-17 DIAGNOSIS — L97521 Non-pressure chronic ulcer of other part of left foot limited to breakdown of skin: Secondary | ICD-10-CM | POA: Diagnosis not present

## 2015-10-17 DIAGNOSIS — M79672 Pain in left foot: Secondary | ICD-10-CM

## 2015-10-17 DIAGNOSIS — M25572 Pain in left ankle and joints of left foot: Secondary | ICD-10-CM

## 2015-10-17 DIAGNOSIS — R262 Difficulty in walking, not elsewhere classified: Secondary | ICD-10-CM

## 2015-10-17 NOTE — Progress Notes (Signed)
Patient ID: [name], female   DOB: [birth year], 49 y.o.   MRN: [id number]  Subjective: 49 year old female presents the office they for follow-up with vibration of ulceration left big toe. She states that she's been doing well and she has been applying antibiotic ointment followed by a dressing. She has been wearing an ankle brace on left side for which she was being treated for left foot pain with Dr. Doran Durand. She's been undergoing physical therapy. The brace and the left side appears to be rubbing the skin. No other complaints at this time in no acute changes. She denies any systemic complaints such as fevers, chills, nausea, vomiting. No calf pain, chest pain, shortness of breath.  Objective: AAO 3, NAD DP/PT pulses palpable, CRT less than 3 seconds Protective sensation decreased with Simms Weinstein monofilament On the plantar aspect of left hallux is a hyperkeratotic lesion. Upon debridement there is 2 small pinpoint openings. There is no probing, undermining, tunneling. There is no surrounding erythema, ascending cellulitis, fluctuance, crepitus, malodor, drainage/purulence. No edema to the hallux. On the lateral aspect of the ankle there does appear to be a superficial abrasion for the brace is rubbing. There is no swelling erythema or signs of infection. No other open lesions or pre-ulcerative lesions. There is no pain with calf compression, swelling, warmth, erythema.  Assessment: 49 year old female left healing plantar hallux ulceration  Plan: Lesion was sharply debrided without Complications. There is 2 small openings. Recommended continued antibiotic ointment and a dressing change daily. Recommended to wear thick sock with her brace. Hold off on wearing the brace for now until the skin heals. It has not healed within the next week or so to call the office or to discuss with Dr. Doran Durand. Monitor for signs or symptoms of infection. Follow-up with me in 4 weeks or sooner if any  problems are to arise. Call any questions or concerns.  Celesta Gentile, DPM

## 2015-10-17 NOTE — Therapy (Signed)
[place], [place], [address] Phone: [phone]   Fax:  [phone]  Physical Therapy Treatment  Patient Details  Name: [name] MRN: [id number] Date of Birth: 03-Aug-1966 Referring Provider: Wylene Simmer MD  Encounter Date: 10/17/2015      PT End of Session - 10/17/15 1052    Visit Number 4   Number of Visits 16   Date for PT Re-Evaluation 11/28/15   Authorization Type UMR   PT Start Time 31      Past Medical History  Diagnosis Date  . Hypertension   . GERD (gastroesophageal reflux disease)   . Fibromyalgia   . Proteinuria   . PONV (postoperative nausea and vomiting)   . Anginal pain ([place])     chest pain  heart cath done  . History of hiatal hernia   . Kidney stones   . High cholesterol   . Chronic bronchitis ([place])     "get it q yr"  . Hypothyroidism   . Type II diabetes mellitus ([place])   . Iron deficiency anemia     "comes and goes"  . Migraine     "@ least once/month" (02/18/2015)  . History of gout   . Cancer [facility])     Past Surgical History  Procedure Laterality Date  . Lumbar disc surgery  08/2011  . Colonoscopy    . Esophagogastroduodenoscopy    . Total thyroidectomy  02/18/2015  . Back surgery    . Cystoscopy with ureteroscopy, stone basketry and stent placement  ~ 2006  . Abdominal hysterectomy  ~2012    "lap"  . Cardiac catheterization  04/2009    "clean"  . Laparoscopic cholecystectomy  05/2004  . Thyroidectomy N/A 02/18/2015    Procedure: TOTAL THYROIDECTOMY;  Surgeon: [name], MD;  Location: [facility];  Service: General;  Laterality: N/A;    There were no vitals filed for this visit.  Visit Diagnosis:  Pain in left foot  Left ankle pain  Difficulty walking  Falls infrequently      Subjective Assessment - 10/17/15 1054    Currently in Pain? Yes   Pain Score 4    Pain Location Ankle   Pain Orientation Left   Pain Descriptors / Indicators  Aching   Aggravating Factors  standing   Pain Relieving Factors ice elevate                         [facility] Adult PT Treatment/Exercise - 10/17/15 1057    Modalities   Modalities Cryotherapy   Cryotherapy   Number Minutes Cryotherapy 15 Minutes   Cryotherapy Location Ankle   Type of Cryotherapy --  vaso no pressure   Ankle Exercises: Seated   Ankle Circles/Pumps AROM;Both;15 reps   Other Seated Ankle Exercises seated towel inversion/eversion,    Other Seated Ankle Exercises seated pronation/supination AROM on rocker baord and on floor, Heel raises, toe raises seated, left sidelying ankle inversion with DF, Pro stretch DF/PF rocking.                   PT Short Term Goals - 10/17/15 1053    PT SHORT TERM GOAL #1   Title Pt will be independent with initial HEP for ankle    Time 4   Period Weeks   Status Achieved   PT SHORT TERM GOAL #2   Title Pt will utilize a straight cane for safety with ambulation to reduce  falls to zero   Time 4   Period Weeks   Status On-going   PT SHORT TERM GOAL #3   Title Pt will be able to regain AROM of Left dorsiflexion to begin standing strengthening exercises/ weight shifting with UE support   Time 4   Period Weeks   Status Partially Met           PT Long Term Goals - 10/06/15 1148    PT LONG TERM GOAL #1   Title Pt will be independent with advanced HEP to address core and LE strength   Time 8   Period Weeks   Status On-going   PT LONG TERM GOAL #2   Title Pt will reduce LEFS limiation from 67 % to at leat 35% or less   Time 8   Period Weeks   Status On-going   PT LONG TERM GOAL #3   Title Pt will be able to perform heel raises bilaterally 25 /25 to show increase Plantarflexion strength   Time 8   Period Weeks   Status On-going   PT LONG TERM GOAL #4   Title Pt will be 2/10 or less in left lower extremity for all functional activities.   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 MD asked pt to remove brace for the day and wear wool socks with brace for cushioning due to open would on lateral lower leg. Instructed pt in seated AROM exercises with most difficulty with inversion pronation as well as difficulty maintaining neutral during DF/PF exercises. She walks in ER and is trying to correct. Vaso used for cryotherapy at end of session due to pain increase to 6.5/10.    PT Next Visit Plan focus seated AROM, maybe ready for resisted 4 way ankle, standing exercises as tolerated.         Problem List Patient Active Problem List   Diagnosis Date Noted  . Neoplasm of uncertain behavior of thyroid gland, right lobe 02/18/2015  . DIABETES MELLITUS, TYPE II 04/14/2009  . DIABETES MELLITUS, WITH RENAL COMPLICATIONS 29/24/4628  . HYPERLIPIDEMIA 04/14/2009  . OBESITY 04/14/2009  . ANXIETY DEPRESSION 04/14/2009  . HYPERTENSION 04/14/2009  . COPD 04/14/2009  . CHEST PAIN 04/14/2009  . TACHYCARDIA, HX OF 04/14/2009    Dorene Ar, PTA 10/17/2015, 12:04 PM  [place], [place], [address] Phone: [phone]   Fax:  [phone]  Name: [name] MRN: [id number] Date of Birth: Jun 12, 1966

## 2015-10-19 ENCOUNTER — Ambulatory Visit: Payer: 59 | Admitting: Physical Therapy

## 2015-10-19 DIAGNOSIS — M79672 Pain in left foot: Secondary | ICD-10-CM | POA: Diagnosis not present

## 2015-10-19 DIAGNOSIS — Z9181 History of falling: Secondary | ICD-10-CM

## 2015-10-19 DIAGNOSIS — R262 Difficulty in walking, not elsewhere classified: Secondary | ICD-10-CM

## 2015-10-19 DIAGNOSIS — M25572 Pain in left ankle and joints of left foot: Secondary | ICD-10-CM

## 2015-10-19 NOTE — Patient Instructions (Signed)
   Copyright  VHI. All rights reserved.  Eversion: Resisted   With right foot in tubing loop, hold tubing around other foot to resist and turn foot out. Repeat _10___ times per set. Do __2__ sets per session. Do _2___ sessions per day.  http://orth.exer.us/14   Copyright  VHI. All rights reserved.  Plantar Flexion: Resisted   Anchor behind, tubing around left foot, press down. Repeat _10___ times per set. Do _2___ sets per session. Do ___2_ sessions per day.  http://orth.exer.us/10   Copyright  VHI. All rights reserved.  Dorsiflexion: Resisted   Facing anchor, tubing around left foot, pull toward face.  Repeat __10__ times per set. Do __2__ sets per session. Do _2___ sessions per day.  http://orth.exer.us/8   Copyright  VHI. All rights reserved.

## 2015-10-20 NOTE — Therapy (Signed)
[place], [place], [address] Phone: [phone]   Fax:  [phone]  Physical Therapy Treatment  Patient Details  Name: [name] MRN: [id number] Date of Birth: [birth year] Referring Provider: Wylene Simmer MD  Encounter Date: 10/19/2015      PT End of Session - 10/19/15 0934    Visit Number 5   Number of Visits 16   Date for PT Re-Evaluation 11/28/15   Authorization Type UMR   PT Start Time 0930   PT Stop Time 1015   PT Time Calculation (min) 45 min      Past Medical History  Diagnosis Date  . Hypertension   . GERD (gastroesophageal reflux disease)   . Fibromyalgia   . Proteinuria   . PONV (postoperative nausea and vomiting)   . Anginal pain ([place])     chest pain  heart cath done  . History of hiatal hernia   . Kidney stones   . High cholesterol   . Chronic bronchitis ([place])     "get it q yr"  . Hypothyroidism   . Type II diabetes mellitus ([place])   . Iron deficiency anemia     "comes and goes"  . Migraine     "@ least once/month" (02/18/2015)  . History of gout   . Cancer [facility])     Past Surgical History  Procedure Laterality Date  . Lumbar disc surgery  08/2011  . Colonoscopy    . Esophagogastroduodenoscopy    . Total thyroidectomy  02/18/2015  . Back surgery    . Cystoscopy with ureteroscopy, stone basketry and stent placement  ~ 2006  . Abdominal hysterectomy  ~2012    "lap"  . Cardiac catheterization  04/2009    "clean"  . Laparoscopic cholecystectomy  05/2004  . Thyroidectomy N/A 02/18/2015    Procedure: TOTAL THYROIDECTOMY;  Surgeon: [name], MD;  Location: [place];  Service: General;  Laterality: N/A;    There were no vitals filed for this visit.  Visit Diagnosis:  Left ankle pain  Difficulty walking  Pain in left foot  Falls infrequently                       [facility] Adult PT Treatment/Exercise - 10/19/15 0947    Knee/Hip Exercises:  Stretches   Other Knee/Hip Stretches Passive knee to opposite shoulder/ IR hip stretching   Knee/Hip Exercises: Standing   Heel Raises 10 reps   Heel Raises Limitations toe raises x 10, then left only x 10   Rocker Board 2 minutes   SLS with bil UE support    Manual Therapy   Manual Therapy Joint mobilization;Passive ROM;Taping   Joint Mobilization gentle proximal and distal fibular mobs   Passive ROM Passive pronation/eversion stretching, DF stretching   McConnell Peroneal tendon tape   Ankle Exercises: Supine   T-Band yellow band x 15 each excluding inversion for now- focused eversion/pronation                  PT Short Term Goals - 10/19/15 0948    PT SHORT TERM GOAL #1   Title Pt will be independent with initial HEP for ankle    Time 4   Period Weeks   Status Achieved   PT SHORT TERM GOAL #2   Title Pt will utilize a straight cane for safety with ambulation to reduce falls to zero   Time 4   Period Weeks  Status Achieved   PT SHORT TERM GOAL #3   Title Pt will be able to regain AROM of Left dorsiflexion to begin standing strengthening exercises/ weight shifting with UE support   Time 4   Period Weeks   Status Partially Met           PT Long Term Goals - 10/06/15 1148    PT LONG TERM GOAL #1   Title Pt will be independent with advanced HEP to address core and LE strength   Time 8   Period Weeks   Status On-going   PT LONG TERM GOAL #2   Title Pt will reduce LEFS limiation from 67 % to at leat 35% or less   Time 8   Period Weeks   Status On-going   PT LONG TERM GOAL #3   Title Pt will be able to perform heel raises bilaterally 25 /25 to show increase Plantarflexion strength   Time 8   Period Weeks   Status On-going   PT LONG TERM GOAL #4   Title Pt will be 2/10 or less in left lower extremity for all functional activities.   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 She also reports new knee pain. She attributes the pain to her trying to improve her gait pattern and decreasing compensations with her HEP. At rest her ankle sits in supination / inversion. She has pain with active and passive pronation/ eversion. Trial of kinesiotape and issued yellow band ankle for HEP. Will send prescription to Dr Doran Durand for signature to get orthotics.    PT Next Visit Plan check yellow band exercises, check ROM and strength, check for signed orthotic order.         Problem List Patient Active Problem List   Diagnosis Date Noted  . Neoplasm of uncertain behavior of thyroid gland, right lobe 02/18/2015  . DIABETES MELLITUS, TYPE II 04/14/2009  . DIABETES MELLITUS, WITH RENAL COMPLICATIONS 61/22/4497  . HYPERLIPIDEMIA 04/14/2009  . OBESITY 04/14/2009  . ANXIETY DEPRESSION 04/14/2009  . HYPERTENSION 04/14/2009  . COPD 04/14/2009  . CHEST PAIN 04/14/2009  . TACHYCARDIA, HX OF 04/14/2009    Dorene Ar, PTA 10/20/2015, 9:50 AM  [place], [place], [address] Phone: [phone]   Fax:  [phone]  Name: [name] MRN: [id number] Date of Birth: [birth year]

## 2015-10-24 ENCOUNTER — Ambulatory Visit: Payer: 59 | Admitting: Physical Therapy

## 2015-10-24 DIAGNOSIS — M79672 Pain in left foot: Secondary | ICD-10-CM

## 2015-10-24 DIAGNOSIS — M25572 Pain in left ankle and joints of left foot: Secondary | ICD-10-CM

## 2015-10-24 DIAGNOSIS — R262 Difficulty in walking, not elsewhere classified: Secondary | ICD-10-CM

## 2015-10-24 DIAGNOSIS — Z9181 History of falling: Secondary | ICD-10-CM

## 2015-10-24 NOTE — Therapy (Signed)
[place], [place], [address] Phone: [phone]   Fax:  [phone]  Physical Therapy Treatment  Patient Details  Name: [name] MRN: [id number] Date of Birth: Oct 01, 1966 Referring Provider: Wylene Simmer MD  Encounter Date: 10/24/2015      PT End of Session - 10/24/15 1205    Visit Number 6   Number of Visits 16   Date for PT Re-Evaluation 11/28/15   PT Start Time 1100   PT Stop Time 1200   PT Time Calculation (min) 60 min      Past Medical History  Diagnosis Date  . Hypertension   . GERD (gastroesophageal reflux disease)   . Fibromyalgia   . Proteinuria   . PONV (postoperative nausea and vomiting)   . Anginal pain ([place])     chest pain  heart cath done  . History of hiatal hernia   . Kidney stones   . High cholesterol   . Chronic bronchitis ([place])     "get it q yr"  . Hypothyroidism   . Type II diabetes mellitus ([place])   . Iron deficiency anemia     "comes and goes"  . Migraine     "@ least once/month" (02/18/2015)  . History of gout   . Cancer [facility])     Past Surgical History  Procedure Laterality Date  . Lumbar disc surgery  08/2011  . Colonoscopy    . Esophagogastroduodenoscopy    . Total thyroidectomy  02/18/2015  . Back surgery    . Cystoscopy with ureteroscopy, stone basketry and stent placement  ~ 2006  . Abdominal hysterectomy  ~2012    "lap"  . Cardiac catheterization  04/2009    "clean"  . Laparoscopic cholecystectomy  05/2004  . Thyroidectomy N/A 02/18/2015    Procedure: TOTAL THYROIDECTOMY;  Surgeon: [name], MD;  Location: [place];  Service: General;  Laterality: N/A;    There were no vitals filed for this visit.  Visit Diagnosis:  Left ankle pain  Difficulty walking  Pain in left foot  Falls infrequently      Subjective Assessment - 10/24/15 1349    Subjective Patient continues to be frustrated with her pain and lack of ROM in her ankle. She  said she went to the doctor and he is ordering an MRI of her ankle. She said that the taping was effective in reducing her pain a little.   Currently in Pain? Yes   Pain Score 4    Pain Location Ankle   Pain Type Chronic pain   Aggravating Factors  standing, AROM   Pain Relieving Factors Ice            [facility] PT Assessment - 10/24/15 0001    AROM   Left Ankle Dorsiflexion 6   Left Ankle Plantar Flexion 42   Strength   Left Ankle Dorsiflexion 4-/5   Left Ankle Plantar Flexion 2/5                     [facility] Adult PT Treatment/Exercise - 10/24/15 1404    Ankle Exercises: Stretches   Other Stretch dorsiflexor stretch 3 x 30 seconds,  evertor stretch 3 x 30 seconds invertor stretch 3 x 30 seconds   Ankle Exercises: Supine   T-Band yellow band 4way series x 15 each (excluding inversion for now) - focused eversion/pronation   Ankle Exercises: Seated   Ankle Circles/Pumps AROM;Both;15 reps   BAPS Sitting;Level 2  BAPS Limitations 3 minutes dorsiflexion and plantar flexion; 3 minutes eversion and inversion   Ankle Exercises: Standing   Heel Raises 10 reps 2 sets with bilateral hand hold bil hand hold;with weight shift to left foot   Cryotherapy Left ankle 15 minutes Vaso with no pressure                   PT Short Term Goals - 10/24/15 1401    PT SHORT TERM GOAL #1   Title Pt will be independent with initial HEP for ankle    Time 4   Period Weeks   Status Achieved   PT SHORT TERM GOAL #2   Title Pt will utilize a straight cane for safety with ambulation to reduce falls to zero   Time 4   Period Weeks   Status Achieved   PT SHORT TERM GOAL #3   Title Pt will be able to regain AROM of Left dorsiflexion to begin standing strengthening exercises/ weight shifting with UE support   Time 4   Period Weeks   Status Achieved           PT Long Term Goals - 10/24/15 1402    PT LONG TERM GOAL #1   Title Pt will be independent with advanced HEP to  address core and LE strength   Time 8   Period Weeks   Status On-going   PT LONG TERM GOAL #2   Title Pt will reduce LEFS limiation from 67 % to at leat 35% or less   Time 8   Period Weeks   Status On-going   PT LONG TERM GOAL #3   Title Pt will be able to perform heel raises bilaterally 25 /25 to show increase Plantarflexion strength   Time 8   Period Weeks   Status On-going   PT LONG TERM GOAL #4   Title Pt will be 2/10 or less in left lower extremity for all functional activities.   Time 8   Period Weeks   Status On-going   PT LONG TERM GOAL #5   Title Pt will negotiate steps independently without use of assistive device   Time 8   Period Weeks   Status On-going   PT LONG TERM GOAL #6   Time 8   Period Weeks   Status On-going   PT LONG TERM GOAL #7   Title Pt will utilized orthotic to improve pes planus and provide at least 50% increased comfort with standing activites for longer than 1 hour   Time 8   Period Weeks   Status On-going               Plan - 10/24/15 1353    Clinical Impression Statement Patient continues to present with lateral ankle pain, but today it was reduced to a 4/10. Patient is compliant with HEP at home and is able to perform the yellow band multiplanar ankle exercises with the help of her husband at home. [name] ROM is improving as she has gone from -10 degrees L ankle dorsiflexion to 6 degrees (and as a result has met STG # 3) and from 39 degrees L ankle plantar flexion to 42 degrees . She was given a green band to take home for dorsiflexion and plantarflexion exercises to increase her strength and progress.   PT Next Visit Plan check green band exercises and tolerance to increased resistance, work on isolating left foot during bilateral standing strengthening (heel raises, etc.)  Problem List Patient Active Problem List   Diagnosis Date Noted  . Neoplasm of uncertain behavior of thyroid gland, right lobe 02/18/2015  .  DIABETES MELLITUS, TYPE II 04/14/2009  . DIABETES MELLITUS, WITH RENAL COMPLICATIONS 87/57/9728  . HYPERLIPIDEMIA 04/14/2009  . OBESITY 04/14/2009  . ANXIETY DEPRESSION 04/14/2009  . HYPERTENSION 04/14/2009  . COPD 04/14/2009  . CHEST PAIN 04/14/2009  . TACHYCARDIA, HX OF 04/14/2009   Laury Axon, [place] 10/24/2015 2:08 PM PHONE:[phone] [facility] [place], [place], [address] Phone: [phone]   Fax:  [phone]  Name: [name] MRN: [id number] Date of Birth: [birth year]

## 2015-10-27 ENCOUNTER — Other Ambulatory Visit (HOSPITAL_COMMUNITY): Payer: Self-pay | Admitting: Orthopedic Surgery

## 2015-10-27 DIAGNOSIS — M25572 Pain in left ankle and joints of left foot: Secondary | ICD-10-CM

## 2015-10-28 ENCOUNTER — Ambulatory Visit: Payer: 59 | Admitting: Physical Therapy

## 2015-10-28 DIAGNOSIS — M79672 Pain in left foot: Secondary | ICD-10-CM | POA: Diagnosis not present

## 2015-10-28 DIAGNOSIS — Z9181 History of falling: Secondary | ICD-10-CM

## 2015-10-28 DIAGNOSIS — R262 Difficulty in walking, not elsewhere classified: Secondary | ICD-10-CM

## 2015-10-28 DIAGNOSIS — M25572 Pain in left ankle and joints of left foot: Secondary | ICD-10-CM

## 2015-10-28 NOTE — Therapy (Signed)
[place], [place], [address] Phone: [phone]   Fax:  [phone]  Physical Therapy Treatment  Patient Details  Name: [name] MRN: [id number] Date of Birth: 1966-03-28 Referring Provider: Wylene Simmer MD  Encounter Date: 10/28/2015      PT End of Session - 10/28/15 1021    Visit Number 7   Number of Visits 16   Date for PT Re-Evaluation 11/28/15   PT Start Time 1019   PT Stop Time 1100   PT Time Calculation (min) 41 min      Past Medical History  Diagnosis Date  . Hypertension   . GERD (gastroesophageal reflux disease)   . Fibromyalgia   . Proteinuria   . PONV (postoperative nausea and vomiting)   . Anginal pain ([place])     chest pain  heart cath done  . History of hiatal hernia   . Kidney stones   . High cholesterol   . Chronic bronchitis ([place])     "get it q yr"  . Hypothyroidism   . Type II diabetes mellitus ([place])   . Iron deficiency anemia     "comes and goes"  . Migraine     "@ least once/month" (02/18/2015)  . History of gout   . Cancer [facility])     Past Surgical History  Procedure Laterality Date  . Lumbar disc surgery  08/2011  . Colonoscopy    . Esophagogastroduodenoscopy    . Total thyroidectomy  02/18/2015  . Back surgery    . Cystoscopy with ureteroscopy, stone basketry and stent placement  ~ 2006  . Abdominal hysterectomy  ~2012    "lap"  . Cardiac catheterization  04/2009    "clean"  . Laparoscopic cholecystectomy  05/2004  . Thyroidectomy N/A 02/18/2015    Procedure: TOTAL THYROIDECTOMY;  Surgeon: [name], MD;  Location: [place];  Service: General;  Laterality: N/A;    There were no vitals filed for this visit.  Visit Diagnosis:  Left ankle pain  Difficulty walking  Pain in left foot  Falls infrequently      Subjective Assessment - 10/28/15 1027    Subjective I am doing better today   Currently in Pain? Yes   Pain Score 4    Pain Location  Ankle   Pain Orientation Left   Pain Descriptors / Indicators Sore   Aggravating Factors  walking prolonged                         [facility] Adult PT Treatment/Exercise - 10/28/15 1037    Manual Therapy   Manual Therapy Soft tissue mobilization;Taping   Soft tissue mobilization along peroneals lateral leg avoiding open wounds   Kinesiotex Facilitate Muscle   Kinesiotix   Facilitate Muscle  peroneal tendon tape as previos with clear skin bandaid applied over wound   Ankle Exercises: Seated   Heel Raises 20 reps   Toe Raise 20 reps   Other Seated Ankle Exercises seated inv ever with yellow band x 20 each                   PT Short Term Goals - 10/24/15 1401    PT SHORT TERM GOAL #1   Title Pt will be independent with initial HEP for ankle    Time 4   Period Weeks   Status Achieved   PT SHORT TERM GOAL #2   Title Pt will utilize a  straight cane for safety with ambulation to reduce falls to zero   Time 4   Period Weeks   Status Achieved   PT SHORT TERM GOAL #3   Title Pt will be able to regain AROM of Left dorsiflexion to begin standing strengthening exercises/ weight shifting with UE support   Time 4   Period Weeks   Status Achieved           PT Long Term Goals - 10/24/15 1402    PT LONG TERM GOAL #1   Title Pt will be independent with advanced HEP to address core and LE strength   Time 8   Period Weeks   Status On-going   PT LONG TERM GOAL #2   Title Pt will reduce LEFS limiation from 67 % to at leat 35% or less   Time 8   Period Weeks   Status On-going   PT LONG TERM GOAL #3   Title Pt will be able to perform heel raises bilaterally 25 /25 to show increase Plantarflexion strength   Time 8   Period Weeks   Status On-going   PT LONG TERM GOAL #4   Title Pt will be 2/10 or less in left lower extremity for all functional activities.   Time 8   Period Weeks   Status On-going   PT LONG TERM GOAL #5   Title Pt will negotiate steps  independently without use of assistive device   Time 8   Period Weeks   Status On-going   PT LONG TERM GOAL #6   Time 8   Period Weeks   Status On-going   PT LONG TERM GOAL #7   Title Pt will utilized orthotic to improve pes planus and provide at least 50% increased comfort with standing activites for longer than 1 hour   Time 8   Period Weeks   Status On-going               Plan - 10/28/15 1204    Clinical Impression Statement Having MRI on Monday. Independent with HEP. Trial of lateral lower leg STW to tender areas and tape reapplied.    PT Home Exercise Plan asses benefit of tabe and manual, did she have MRI?        Problem List Patient Active Problem List   Diagnosis Date Noted  . Neoplasm of uncertain behavior of thyroid gland, right lobe 02/18/2015  . DIABETES MELLITUS, TYPE II 04/14/2009  . DIABETES MELLITUS, WITH RENAL COMPLICATIONS [id number]  . HYPERLIPIDEMIA 04/14/2009  . OBESITY 04/14/2009  . ANXIETY DEPRESSION 04/14/2009  . HYPERTENSION 04/14/2009  . COPD 04/14/2009  . CHEST PAIN 04/14/2009  . TACHYCARDIA, HX OF 04/14/2009    Dorene Ar, PTA 10/28/2015, 12:07 PM  [place], [place], [address] Phone: [phone]   Fax:  [phone]  Name: [name] MRN: [id number] Date of Birth: 1966-09-28

## 2015-10-31 ENCOUNTER — Ambulatory Visit: Payer: 59 | Admitting: Physical Therapy

## 2015-10-31 ENCOUNTER — Ambulatory Visit (HOSPITAL_COMMUNITY)
Admission: RE | Admit: 2015-10-31 | Discharge: 2015-10-31 | Disposition: A | Discharge status: Home / Self Care | Payer: 59 | Source: Ambulatory Visit | Attending: Orthopedic Surgery | Admitting: Orthopedic Surgery

## 2015-10-31 DIAGNOSIS — R262 Difficulty in walking, not elsewhere classified: Secondary | ICD-10-CM

## 2015-10-31 DIAGNOSIS — M722 Plantar fascial fibromatosis: Secondary | ICD-10-CM | POA: Insufficient documentation

## 2015-10-31 DIAGNOSIS — M25572 Pain in left ankle and joints of left foot: Secondary | ICD-10-CM

## 2015-10-31 DIAGNOSIS — M79672 Pain in left foot: Secondary | ICD-10-CM | POA: Diagnosis not present

## 2015-10-31 DIAGNOSIS — Z9181 History of falling: Secondary | ICD-10-CM

## 2015-10-31 DIAGNOSIS — M25372 Other instability, left ankle: Secondary | ICD-10-CM | POA: Diagnosis not present

## 2015-10-31 DIAGNOSIS — R600 Localized edema: Secondary | ICD-10-CM

## 2015-10-31 NOTE — Patient Instructions (Signed)

## 2015-10-31 NOTE — Therapy (Signed)
[place], [place], [address] Phone: [phone]   Fax:  [phone]  Physical Therapy Treatment  Patient Details  Name: [name] MRN: [id number] Date of Birth: [birth year] Referring Provider: Wylene Simmer MD  Encounter Date: 10/31/2015      PT End of Session - 10/31/15 1244    Visit Number 8   Number of Visits 16   Date for PT Re-Evaluation 11/28/15   Authorization Type UMR   PT Start Time 1140   PT Stop Time 1235   PT Time Calculation (min) 55 min   Activity Tolerance Patient tolerated treatment well   Behavior During Therapy [facility] for tasks assessed/performed      Past Medical History  Diagnosis Date  . Hypertension   . GERD (gastroesophageal reflux disease)   . Fibromyalgia   . Proteinuria   . PONV (postoperative nausea and vomiting)   . Anginal pain ([place])     chest pain  heart cath done  . History of hiatal hernia   . Kidney stones   . High cholesterol   . Chronic bronchitis ([place])     "get it q yr"  . Hypothyroidism   . Type II diabetes mellitus ([place])   . Iron deficiency anemia     "comes and goes"  . Migraine     "@ least once/month" (02/18/2015)  . History of gout   . Cancer [facility])     Past Surgical History  Procedure Laterality Date  . Lumbar disc surgery  08/2011  . Colonoscopy    . Esophagogastroduodenoscopy    . Total thyroidectomy  02/18/2015  . Back surgery    . Cystoscopy with ureteroscopy, stone basketry and stent placement  ~ 2006  . Abdominal hysterectomy  ~2012    "lap"  . Cardiac catheterization  04/2009    "clean"  . Laparoscopic cholecystectomy  05/2004  . Thyroidectomy N/A 02/18/2015    Procedure: TOTAL THYROIDECTOMY;  Surgeon: [name], MD;  Location: [place];  Service: General;  Laterality: N/A;    There were no vitals filed for this visit.  Visit Diagnosis:  Left ankle pain  Difficulty walking  Pain in left foot  Falls  infrequently  Localized edema      Subjective Assessment - 10/31/15 1142    Subjective I am going tonight at 9 PM for MRI.  At first I didn't think the tape help   Pertinent History DM, thyroidectomy in March 2016, HBP, GERD, Fibromyalgia   How long can you sit comfortably? unlimited   How long can you stand comfortably? 30 min   How long can you walk comfortably? 30 min   Diagnostic tests MRI scheduled   Patient Stated Goals walk without pain, return to work,  start exercise.   Currently in Pain? Yes   Pain Score 4   after treatment 5/10 with mobilization   Pain Location Ankle   Pain Orientation Left   Pain Descriptors / Indicators Sore   Pain Type Chronic pain   Pain Onset More than a month ago   Pain Frequency Intermittent   Aggravating Factors  walking longer than 30 minutes   Pain Relieving Factors ICE   Pain Score 0   Pain Location Foot   Pain Orientation Left   Pain Type Chronic pain            [facility] PT Assessment - 10/31/15 0001    AROM   Left Ankle Dorsiflexion 6  Left Ankle Plantar Flexion 42   Left Ankle Inversion --   Left Ankle Eversion --                     [facility] Adult PT Treatment/Exercise - 10/31/15 1139    Knee/Hip Exercises: Stretches   Other Knee/Hip Stretches IT band stretch in supine with strap for 30 seconds  x2   Knee/Hip Exercises: Machines for Strengthening   Other Machine stationary bike 7 minutes    Knee/Hip Exercises: Standing   Heel Raises 15 reps   Heel Raises Limitations toe raises x 10, then left only x 5   Lateral Step Up 15 reps;Hand Hold: 2  hand hold at counter   Lateral Step Up Limitations VC for technique   Forward Step Up 15 reps;Hand Hold: 2   Other Standing Knee Exercises wt shifting side to side and forward and back plus SLS on left with Right hip flex with add and abd for wt shifting in SLS ankle x 10   Iontophoresis   Type of Iontophoresis Dexamethasone   Location Left anterior ankle   Dose 1cc    Time 4-6 hours   Manual Therapy   Manual Therapy Soft tissue mobilization;Taping   Joint Mobilization Ankle talocrural and DF/PF   IN/EV grade 3    Soft tissue mobilization along peroneals lateral leg avoiding open wounds   Kinesiotex Facilitate Muscle   Kinesiotix   Facilitate Muscle  peroneal tendon tape as previos with clear skin bandaid applied over wound   Ankle Exercises: Seated   Heel Raises 20 reps   Toe Raise 20 reps   Ankle Exercises: Stretches   Other Educational psychologist board 20 times PF/DF                PT Education - 10/31/15 1207    Education provided Yes   Education Details Iontophoresis instructions.  Reviewed standing gastroc/soeus, Educated on interim over the [employer]) Educated Patient   Methods Explanation   Comprehension Verbalized understanding          PT Short Term Goals - 10/24/15 1401    PT SHORT TERM GOAL #1   Title Pt will be independent with initial HEP for ankle    Time 4   Period Weeks   Status Achieved   PT SHORT TERM GOAL #2   Title Pt will utilize a straight cane for safety with ambulation to reduce falls to zero   Time 4   Period Weeks   Status Achieved   PT SHORT TERM GOAL #3   Title Pt will be able to regain AROM of Left dorsiflexion to begin standing strengthening exercises/ weight shifting with UE support   Time 4   Period Weeks   Status Achieved           PT Long Term Goals - 10/24/15 1402    PT LONG TERM GOAL #1   Title Pt will be independent with advanced HEP to address core and LE strength   Time 8   Period Weeks   Status On-going   PT LONG TERM GOAL #2   Title Pt will reduce LEFS limiation from 67 % to at leat 35% or less   Time 8   Period Weeks   Status On-going   PT LONG TERM GOAL #3   Title Pt will be able to perform heel raises bilaterally 25 /25 to show increase Plantarflexion strength   Time 8  Period Weeks   Status On-going   PT LONG TERM GOAL #4   Title Pt will be 2/10 or  less in left lower extremity for all functional activities.   Time 8   Period Weeks   Status On-going   PT LONG TERM GOAL #5   Title Pt will negotiate steps independently without use of assistive device   Time 8   Period Weeks   Status On-going   PT LONG TERM GOAL #6   Time 8   Period Weeks   Status On-going   PT LONG TERM GOAL #7   Title Pt will utilized orthotic to improve pes planus and provide at least 50% increased comfort with standing activites for longer than 1 hour   Time 8   Period Weeks   Status On-going               Plan - 10/31/15 1220    Clinical Impression Statement Pt having MRI on Monday.  Independent with HEP.  given instruction for iontophoreisis patch and IT band stretch in supine.  Pt given order from MD for custom orthotics order from MD to have for records.  Pt educated on over the counter interim orthotics.  Will conitnue to progress with standing strengthening after MRI.   Pt will benefit from skilled therapeutic intervention in order to improve on the following deficits Abnormal gait;Decreased balance;Decreased mobility;Decreased endurance;Decreased range of motion;Decreased strength;Increased edema;Difficulty walking;Increased fascial restricitons;Postural dysfunction;Improper body mechanics;Pain;Obesity   Rehab Potential Good   PT Frequency 2x / week   PT Duration 8 weeks   PT Treatment/Interventions ADLs/Self Care Home Management;Cryotherapy;Electrical Stimulation;Iontophoresis 4mg /ml Dexamethasone;Moist Heat;Therapeutic exercise;Functional mobility training;Stair training;Gait training;Ultrasound;Neuromuscular re-education;Patient/family education;Orthotic Fit/Training;Manual techniques;Taping;Dry needling   PT Next Visit Plan check green band exercises and tolerance to increased resistance, work on isolating left foot during bilateral standing strengthening (heel raises, etc.)  Assess ionto and goals after MRI taken   PT Home Exercise Plan added IT  band verbally and had pt demonstrate with strap        Problem List Patient Active Problem List   Diagnosis Date Noted  . Neoplasm of uncertain behavior of thyroid gland, right lobe 02/18/2015  . DIABETES MELLITUS, TYPE II 04/14/2009  . DIABETES MELLITUS, WITH RENAL COMPLICATIONS [id number]  . HYPERLIPIDEMIA 04/14/2009  . OBESITY 04/14/2009  . ANXIETY DEPRESSION 04/14/2009  . HYPERTENSION 04/14/2009  . COPD 04/14/2009  . CHEST PAIN 04/14/2009  . TACHYCARDIA, HX OF 04/14/2009    Voncille Lo, PT 10/31/2015 12:44 PM Phone: [phone] Fax: [facility] [address] [place], [place], [address] Phone: [phone]   Fax:  [phone]  Name: [name] MRN: [id number] Date of Birth: 22-May-1966

## 2015-11-02 ENCOUNTER — Ambulatory Visit: Payer: 59 | Admitting: Physical Therapy

## 2015-11-02 DIAGNOSIS — R262 Difficulty in walking, not elsewhere classified: Secondary | ICD-10-CM

## 2015-11-02 DIAGNOSIS — R600 Localized edema: Secondary | ICD-10-CM

## 2015-11-02 DIAGNOSIS — M79672 Pain in left foot: Secondary | ICD-10-CM | POA: Diagnosis not present

## 2015-11-02 DIAGNOSIS — M25572 Pain in left ankle and joints of left foot: Secondary | ICD-10-CM

## 2015-11-02 DIAGNOSIS — Z9181 History of falling: Secondary | ICD-10-CM

## 2015-11-02 NOTE — Therapy (Signed)
[place], [place], [address] Phone: [phone]   Fax:  [phone]  Physical Therapy Treatment  Patient Details  Name: INETTA FRANSSEN MRN: [id number] Date of Birth: 1966/08/15 Referring Provider: Wylene Simmer MD  Encounter Date: 11/02/2015      PT End of Session - 11/02/15 0934    Visit Number 9   Number of Visits 16   Date for PT Re-Evaluation 11/28/15   PT Start Time 0930   PT Stop Time 1030   PT Time Calculation (min) 60 min   Activity Tolerance Patient tolerated treatment well   Behavior During Therapy [facility] for tasks assessed/performed      Past Medical History  Diagnosis Date  . Hypertension   . GERD (gastroesophageal reflux disease)   . Fibromyalgia   . Proteinuria   . PONV (postoperative nausea and vomiting)   . Anginal pain ([place])     chest pain  heart cath done  . History of hiatal hernia   . Kidney stones   . High cholesterol   . Chronic bronchitis ([place])     "get it q yr"  . Hypothyroidism   . Type II diabetes mellitus ([place])   . Iron deficiency anemia     "comes and goes"  . Migraine     "@ least once/month" (02/18/2015)  . History of gout   . Cancer [facility])     Past Surgical History  Procedure Laterality Date  . Lumbar disc surgery  08/2011  . Colonoscopy    . Esophagogastroduodenoscopy    . Total thyroidectomy  02/18/2015  . Back surgery    . Cystoscopy with ureteroscopy, stone basketry and stent placement  ~ 2006  . Abdominal hysterectomy  ~2012    "lap"  . Cardiac catheterization  04/2009    "clean"  . Laparoscopic cholecystectomy  05/2004  . Thyroidectomy N/A 02/18/2015    Procedure: TOTAL THYROIDECTOMY;  Surgeon: [name], MD;  Location: [facility];  Service: General;  Laterality: N/A;    There were no vitals filed for this visit.  Visit Diagnosis:  Left ankle pain  Difficulty walking  Pain in left foot  Falls infrequently  Localized  edema                       [facility] Adult PT Treatment/Exercise - 11/02/15 1023    Knee/Hip Exercises: Stretches   Other Knee/Hip Stretches active IT band stretch in supine hold/relax for 30 seconds  x3   Iontophoresis   Type of Iontophoresis Dexamethasone   Location Left anterior ankle   Dose 1cc   Time 4-6 hours   Vasopneumatic   Number Minutes Vasopneumatic  15 minutes   Vasopnuematic Location  Ankle   Vasopneumatic Pressure Medium   Vasopneumatic Temperature  32   Ankle Exercises: Stretches   Other Stretch dorsiflexor stretch 3 x 30 seconds,  eversion and inversion stretch 3 x 30 second    Other Stretch Rocker board x 25 DF/PF   Ankle Exercises: Standing   Step Ups Heel Raises X 10 with forward weight shift 10 reps  bil hand hold; 2 sets, with weight shift to left foot   Toe Raise  SLS with vectors Weight sifting 15 reps  bil All 4 direction x 10 rt (at counter) X 10 (at counter)    Ankle Exercises: Seated   Heel Raises 20 reps   Toe Raise 20 reps  PT Short Term Goals - 10/24/15 1401    PT SHORT TERM GOAL #1   Title Pt will be independent with initial HEP for ankle    Time 4   Period Weeks   Status Achieved   PT SHORT TERM GOAL #2   Title Pt will utilize a straight cane for safety with ambulation to reduce falls to zero   Time 4   Period Weeks   Status Achieved   PT SHORT TERM GOAL #3   Title Pt will be able to regain AROM of Left dorsiflexion to begin standing strengthening exercises/ weight shifting with UE support   Time 4   Period Weeks   Status Achieved           PT Long Term Goals - 10/24/15 1402    PT LONG TERM GOAL #1   Title Pt will be independent with advanced HEP to address core and LE strength   Time 8   Period Weeks   Status On-going   PT LONG TERM GOAL #2   Title Pt will reduce LEFS limiation from 67 % to at leat 35% or less   Time 8   Period Weeks   Status On-going   PT LONG TERM GOAL  #3   Title Pt will be able to perform heel raises bilaterally 25 /25 to show increase Plantarflexion strength   Time 8   Period Weeks   Status On-going   PT LONG TERM GOAL #4   Title Pt will be 2/10 or less in left lower extremity for all functional activities.   Time 8   Period Weeks   Status On-going   PT LONG TERM GOAL #5   Title Pt will negotiate steps independently without use of assistive device   Time 8   Period Weeks   Status On-going   PT LONG TERM GOAL #6   Time 8   Period Weeks   Status On-going   PT LONG TERM GOAL #7   Title Pt will utilized orthotic to improve pes planus and provide at least 50% increased comfort with standing activites for longer than 1 hour   Time 8   Period Weeks   Status On-going               Plan - 11/02/15 0939    Clinical Impression Statement PT still has not recieved results of MRI from Monday. Pt. is making progress with excercises and is now able to get some clearance with her right heal in standing with heal raises and weight shifting. Tx. comtinues to focus on stretching and strengthening with more emphasis now being placed on CKC excercises.   PT Next Visit Plan check green band exercises and tolerance to increased resistance, work on isolating left foot during bilateral standing strengthening (heel raises, etc.)  Assess ionto and goals after MRI taken        Problem List Patient Active Problem List   Diagnosis Date Noted  . Neoplasm of uncertain behavior of thyroid gland, right lobe 02/18/2015  . DIABETES MELLITUS, TYPE II 04/14/2009  . DIABETES MELLITUS, WITH RENAL COMPLICATIONS [id number]  . HYPERLIPIDEMIA 04/14/2009  . OBESITY 04/14/2009  . ANXIETY DEPRESSION 04/14/2009  . HYPERTENSION 04/14/2009  . COPD 04/14/2009  . CHEST PAIN 04/14/2009  . TACHYCARDIA, HX OF 04/14/2009   Laury Axon, [place] 11/02/2015 10:47 AM [phone] [facility]  [place], [place], [address] Phone: [phone]   Fax:  [phone]  Name: KIMLA ISGRO MRN: [id number] Date of Birth: 01-Dec-1966

## 2015-11-07 ENCOUNTER — Ambulatory Visit: Payer: 59 | Admitting: Podiatry

## 2015-11-10 ENCOUNTER — Ambulatory Visit: Payer: 59 | Attending: Orthopedic Surgery | Admitting: Physical Therapy

## 2015-11-10 DIAGNOSIS — R296 Repeated falls: Secondary | ICD-10-CM | POA: Insufficient documentation

## 2015-11-10 DIAGNOSIS — M25572 Pain in left ankle and joints of left foot: Secondary | ICD-10-CM | POA: Insufficient documentation

## 2015-11-10 DIAGNOSIS — R262 Difficulty in walking, not elsewhere classified: Secondary | ICD-10-CM | POA: Insufficient documentation

## 2015-11-10 DIAGNOSIS — M79672 Pain in left foot: Secondary | ICD-10-CM | POA: Insufficient documentation

## 2015-11-10 DIAGNOSIS — Z9181 History of falling: Secondary | ICD-10-CM

## 2015-11-10 DIAGNOSIS — R600 Localized edema: Secondary | ICD-10-CM | POA: Diagnosis present

## 2015-11-10 NOTE — Therapy (Signed)
[place], [place], [address] Phone: [phone]   Fax:  [phone]  Physical Therapy Treatment  Patient Details  Name: [name] MRN: [id number] Date of Birth: 04-01-1966 Referring Provider: Wylene Simmer MD  Encounter Date: 11/10/2015      PT End of Session - 11/10/15 1035    Visit Number 10   Number of Visits 16   Date for PT Re-Evaluation 11/28/15   PT Start Time [id number]   PT Stop Time 1115   PT Time Calculation (min) 60 min      Past Medical History  Diagnosis Date  . Hypertension   . GERD (gastroesophageal reflux disease)   . Fibromyalgia   . Proteinuria   . PONV (postoperative nausea and vomiting)   . Anginal pain ([place])     chest pain  heart cath done  . History of hiatal hernia   . Kidney stones   . High cholesterol   . Chronic bronchitis ([place])     "get it q yr"  . Hypothyroidism   . Type II diabetes mellitus ([place])   . Iron deficiency anemia     "comes and goes"  . Migraine     "@ least once/month" (02/18/2015)  . History of gout   . Cancer [facility])     Past Surgical History  Procedure Laterality Date  . Lumbar disc surgery  08/2011  . Colonoscopy    . Esophagogastroduodenoscopy    . Total thyroidectomy  02/18/2015  . Back surgery    . Cystoscopy with ureteroscopy, stone basketry and stent placement  ~ 2006  . Abdominal hysterectomy  ~2012    "lap"  . Cardiac catheterization  04/2009    "clean"  . Laparoscopic cholecystectomy  05/2004  . Thyroidectomy N/A 02/18/2015    Procedure: TOTAL THYROIDECTOMY;  Surgeon: [name], MD;  Location: [place];  Service: General;  Laterality: N/A;    There were no vitals filed for this visit.  Visit Diagnosis:  Left ankle pain  Difficulty walking  Pain in left foot  Falls infrequently  Localized edema      Subjective Assessment - 11/10/15 1037    Subjective My MRI said that I have a ligament tear and a bunch of other  issues.   Currently in Pain? Yes   Pain Score 3    Pain Location Ankle                         [facility] Adult PT Treatment/Exercise - 11/10/15 1248    Self-Care   Self-Care Other Self-Care Comments   Other Self-Care Comments  Discussed MRI and looked at pictures of what her diagnoses was. Also talked about conservative management of the issues   Vasopneumatic   Number Minutes Vasopneumatic  15 minutes   Vasopnuematic Location  Ankle   Vasopneumatic Pressure Medium   Vasopneumatic Temperature  32   Ankle Exercises: Standing   SLS SLS with vectors all 4 directions x10 rt (at counter); weight shifting x 10 (at counter)   Heel Raises 10 reps  bil hand hold; 2 sets, with weight shift to left foot   Toe Raise 15 reps  bil   Other Standing Ankle Exercises Rocker board x 25 DF/PF;   Other Standing Ankle Exercises Heel and toe raises x20 with weight shift over to left foot x20   Ankle Exercises: Stretches   Other Stretch dorsiflexor stretch 3 x 30 seconds, eversion  and inversion stretch 3 x 30 second    Other Stretch Rocker board x 20 DF/PF                  PT Short Term Goals - 10/24/15 1401    PT SHORT TERM GOAL #1   Title Pt will be independent with initial HEP for ankle    Time 4   Period Weeks   Status Achieved   PT SHORT TERM GOAL #2   Title Pt will utilize a straight cane for safety with ambulation to reduce falls to zero   Time 4   Period Weeks   Status Achieved   PT SHORT TERM GOAL #3   Title Pt will be able to regain AROM of Left dorsiflexion to begin standing strengthening exercises/ weight shifting with UE support   Time 4   Period Weeks   Status Achieved           PT Long Term Goals - 10/24/15 1402    PT LONG TERM GOAL #1   Title Pt will be independent with advanced HEP to address core and LE strength   Time 8   Period Weeks   Status On-going   PT LONG TERM GOAL #2   Title Pt will reduce LEFS limiation from 67 % to at leat 35% or  less   Time 8   Period Weeks   Status On-going   PT LONG TERM GOAL #3   Title Pt will be able to perform heel raises bilaterally 25 /25 to show increase Plantarflexion strength   Time 8   Period Weeks   Status On-going   PT LONG TERM GOAL #4   Title Pt will be 2/10 or less in left lower extremity for all functional activities.   Time 8   Period Weeks   Status On-going   PT LONG TERM GOAL #5   Title Pt will negotiate steps independently without use of assistive device   Time 8   Period Weeks   Status On-going   PT LONG TERM GOAL #6   Time 8   Period Weeks   Status On-going   PT LONG TERM GOAL #7   Title Pt will utilized orthotic to improve pes planus and provide at least 50% increased comfort with standing activites for longer than 1 hour   Time 8   Period Weeks   Status On-going               Plan - 11/10/15 1026    Clinical Impression Statement got results of MRI and has a ligament tear. Pt. is concerned about tightening a ligament that is already torn. Pt. also has several other issues that were revealed by the MRI but conservative treatment was recommended by the physician.  Discussed with her what  conservative treatment meant and reviewed the anatomy of the ankle with her so she could understand the doctors information. PT given signed script for custom orthotics.    PT Next Visit Plan , work on isolating left foot during bilateral standing strengthening (heel raises, etc.), continue ankle strengthening and CKC there ex and stretching        Problem List Patient Active Problem List   Diagnosis Date Noted  . Neoplasm of uncertain behavior of thyroid gland, right lobe 02/18/2015  . DIABETES MELLITUS, TYPE II 04/14/2009  . DIABETES MELLITUS, WITH RENAL COMPLICATIONS [id number]  . HYPERLIPIDEMIA 04/14/2009  . OBESITY 04/14/2009  . ANXIETY DEPRESSION 04/14/2009  . HYPERTENSION 04/14/2009  .  COPD 04/14/2009  . CHEST PAIN 04/14/2009  . TACHYCARDIA, HX OF  04/14/2009   Laury Axon, SPTA 11/10/2015 12:55 PM PHONE:[phone] [phone]  Hessie Diener, PTA 11/10/2015 2:36 PM Phone: [phone] Fax: [phone]  During this treatment session, the therapist was present, participating in and directing the treatment.  [place], [place], [address] Phone: [phone]   Fax:  [phone]  Name: SEERIT VILE MRN: [id number] Date of Birth: [birth year]

## 2015-11-11 ENCOUNTER — Encounter: Payer: Self-pay | Admitting: Podiatry

## 2015-11-11 ENCOUNTER — Ambulatory Visit (INDEPENDENT_AMBULATORY_CARE_PROVIDER_SITE_OTHER): Payer: 59 | Admitting: Podiatry

## 2015-11-11 ENCOUNTER — Ambulatory Visit: Payer: 59 | Admitting: Physical Therapy

## 2015-11-11 VITALS — BP 123/72 | HR 96 | Resp 12

## 2015-11-11 DIAGNOSIS — E1140 Type 2 diabetes mellitus with diabetic neuropathy, unspecified: Secondary | ICD-10-CM | POA: Diagnosis not present

## 2015-11-11 DIAGNOSIS — Z9181 History of falling: Secondary | ICD-10-CM

## 2015-11-11 DIAGNOSIS — R262 Difficulty in walking, not elsewhere classified: Secondary | ICD-10-CM

## 2015-11-11 DIAGNOSIS — R600 Localized edema: Secondary | ICD-10-CM

## 2015-11-11 DIAGNOSIS — L97521 Non-pressure chronic ulcer of other part of left foot limited to breakdown of skin: Secondary | ICD-10-CM

## 2015-11-11 DIAGNOSIS — M79672 Pain in left foot: Secondary | ICD-10-CM

## 2015-11-11 DIAGNOSIS — M25572 Pain in left ankle and joints of left foot: Secondary | ICD-10-CM | POA: Diagnosis not present

## 2015-11-11 NOTE — Therapy (Signed)
[place], [place], [address] Phone: [phone]   Fax:  [phone]  Physical Therapy Treatment  Patient Details  Name: [name] MRN: [id number] Date of Birth: 07-31-1966 Referring Provider: Wylene Simmer MD  Encounter Date: 11/11/2015      PT End of Session - 11/11/15 1223    Visit Number 11   Number of Visits 16   Date for PT Re-Evaluation 11/28/15   Authorization Type UMR   PT Start Time 0931   PT Stop Time 1015   PT Time Calculation (min) 44 min   Activity Tolerance Patient tolerated treatment well   Behavior During Therapy [facility] for tasks assessed/performed      Past Medical History  Diagnosis Date  . Hypertension   . GERD (gastroesophageal reflux disease)   . Fibromyalgia   . Proteinuria   . PONV (postoperative nausea and vomiting)   . Anginal pain ([place])     chest pain  heart cath done  . History of hiatal hernia   . Kidney stones   . High cholesterol   . Chronic bronchitis ([place])     "get it q yr"  . Hypothyroidism   . Type II diabetes mellitus ([place])   . Iron deficiency anemia     "comes and goes"  . Migraine     "@ least once/month" (02/18/2015)  . History of gout   . Cancer [facility])     Past Surgical History  Procedure Laterality Date  . Lumbar disc surgery  08/2011  . Colonoscopy    . Esophagogastroduodenoscopy    . Total thyroidectomy  02/18/2015  . Back surgery    . Cystoscopy with ureteroscopy, stone basketry and stent placement  ~ 2006  . Abdominal hysterectomy  ~2012    "lap"  . Cardiac catheterization  04/2009    "clean"  . Laparoscopic cholecystectomy  05/2004  . Thyroidectomy N/A 02/18/2015    Procedure: TOTAL THYROIDECTOMY;  Surgeon: [name], MD;  Location: [place];  Service: General;  Laterality: N/A;    There were no vitals filed for this visit.  Visit Diagnosis:  Difficulty walking  Pain in left foot  Falls infrequently  Localized edema       Subjective Assessment - 11/11/15 1216    Subjective Doing okay I guess, I need to get this foot going. Last time she saw the doctor they decided to continue with conservative treatment.   Limitations Walking;Standing;House hold activities;Other (comment)   How long can you sit comfortably? unlimited   How long can you stand comfortably? 45 minutes in the morning   How long can you walk comfortably? 45 minutes   Patient Stated Goals walk without pain, return to work                         [facility] Adult PT Treatment/Exercise - 11/11/15 0001    Ambulation/Gait   Gait Comments Antalgic pattern, decreased stance time on LLE   Self-Care   Other Self-Care Comments  education on orthotics answering question regarding off the shelf vs custom. Discussing use of ankle brace with activity. To use as directed by physician   Knee/Hip Exercises: Aerobic   Nustep L1X 6 min   Knee/Hip Exercises: Standing   Heel Raises Both;Other (comment)  unable to perform   Lateral Step Up 2 sets;10 reps  hand hold at counter   Lateral Step Up Limitations VC for technique   Ankle  Exercises: Stretches   Other Stretch dorsiflexion 5X 30 seconds   Ankle Exercises: Supine   T-Band blue band resist plantar flexion 1X15   Other Supine Ankle Exercises active ankle eversion/inversion - verbal and tactile cues needed for eversion motion   Other Supine Ankle Exercises attempted band resisted EV, unable to perform due to inability to perform motion consistently                PT Education - 11/11/15 1222    Education provided Yes   Education Details ankle brace use, custom vs off the shelf orthotics and purpose of orthotics   Person(s) Educated Patient   Methods Explanation   Comprehension Verbalized understanding          PT Short Term Goals - 10/24/15 1401    PT SHORT TERM GOAL #1   Title Pt will be independent with initial HEP for ankle    Time 4   Period Weeks   Status Achieved    PT SHORT TERM GOAL #2   Title Pt will utilize a straight cane for safety with ambulation to reduce falls to zero   Time 4   Period Weeks   Status Achieved   PT SHORT TERM GOAL #3   Title Pt will be able to regain AROM of Left dorsiflexion to begin standing strengthening exercises/ weight shifting with UE support   Time 4   Period Weeks   Status Achieved           PT Long Term Goals - 10/24/15 1402    PT LONG TERM GOAL #1   Title Pt will be independent with advanced HEP to address core and LE strength   Time 8   Period Weeks   Status On-going   PT LONG TERM GOAL #2   Title Pt will reduce LEFS limiation from 67 % to at leat 35% or less   Time 8   Period Weeks   Status On-going   PT LONG TERM GOAL #3   Title Pt will be able to perform heel raises bilaterally 25 /25 to show increase Plantarflexion strength   Time 8   Period Weeks   Status On-going   PT LONG TERM GOAL #4   Title Pt will be 2/10 or less in left lower extremity for all functional activities.   Time 8   Period Weeks   Status On-going   PT LONG TERM GOAL #5   Title Pt will negotiate steps independently without use of assistive device   Time 8   Period Weeks   Status On-going   PT LONG TERM GOAL #6   Time 8   Period Weeks   Status On-going   PT LONG TERM GOAL #7   Title Pt will utilized orthotic to improve pes planus and provide at least 50% increased comfort with standing activites for longer than 1 hour   Time 8   Period Weeks   Status On-going               Plan - 11/11/15 1224    Clinical Impression Statement Patient with decreased strenght and ROM through the Lt ankle which has impacted her functional mobiity and gait. Currently the patient is unable to perform body weight heel raises and has poor motor control regarding ankle eversion. She is motivated and cooperative during session and confirms that she is working on he HEP regularly at home. Gait remains antalgic and decreased in  velocity. Patent is appropriate to continue  with PT sessions to address these defecits to increase her functional tolerance and safety with mobility.    PT Next Visit Plan continue with CKC exercises as tolerated, reinforce motor control for ankle eversion and progress with band exercises as able.    PT Home Exercise Plan Work on resisted band exercises with emphasis on eversion, stepup technique.    Consulted and Agree with Plan of Care Patient        Problem List Patient Active Problem List   Diagnosis Date Noted  . Neoplasm of uncertain behavior of thyroid gland, right lobe 02/18/2015  . DIABETES MELLITUS, TYPE II 04/14/2009  . DIABETES MELLITUS, WITH RENAL COMPLICATIONS [id number]  . HYPERLIPIDEMIA 04/14/2009  . OBESITY 04/14/2009  . ANXIETY DEPRESSION 04/14/2009  . HYPERTENSION 04/14/2009  . COPD 04/14/2009  . CHEST PAIN 04/14/2009  . TACHYCARDIA, HX OF 04/14/2009    Linard Millers, PT 11/11/2015, 12:31 PM  [place], [place], [address] Phone: [phone]   Fax:  [phone]  Name: AVERLEIGH BIRCH MRN: [id number] Date of Birth: 09-03-1966

## 2015-11-15 ENCOUNTER — Ambulatory Visit: Payer: 59 | Admitting: Physical Therapy

## 2015-11-15 DIAGNOSIS — R262 Difficulty in walking, not elsewhere classified: Secondary | ICD-10-CM

## 2015-11-15 DIAGNOSIS — Z9181 History of falling: Secondary | ICD-10-CM

## 2015-11-15 DIAGNOSIS — M25572 Pain in left ankle and joints of left foot: Secondary | ICD-10-CM | POA: Diagnosis not present

## 2015-11-15 DIAGNOSIS — R600 Localized edema: Secondary | ICD-10-CM

## 2015-11-15 DIAGNOSIS — M79672 Pain in left foot: Secondary | ICD-10-CM

## 2015-11-15 NOTE — Therapy (Signed)
[place], [place], [address] Phone: [phone]   Fax:  [phone]  Physical Therapy Treatment  Patient Details  Name: [name] MRN: [id number] Date of Birth: Mar 22, 1966 Referring Provider: Wylene Simmer MD  Encounter Date: 11/15/2015      PT End of Session - 11/15/15 0937    Visit Number 12   Number of Visits 16   PT Start Time 0930   PT Stop Time 1030   PT Time Calculation (min) 60 min      Past Medical History  Diagnosis Date  . Hypertension   . GERD (gastroesophageal reflux disease)   . Fibromyalgia   . Proteinuria   . PONV (postoperative nausea and vomiting)   . Anginal pain ([place])     chest pain  heart cath done  . History of hiatal hernia   . Kidney stones   . High cholesterol   . Chronic bronchitis ([place])     "get it q yr"  . Hypothyroidism   . Type II diabetes mellitus ([place])   . Iron deficiency anemia     "comes and goes"  . Migraine     "@ least once/month" (02/18/2015)  . History of gout   . Cancer [facility])     Past Surgical History  Procedure Laterality Date  . Lumbar disc surgery  08/2011  . Colonoscopy    . Esophagogastroduodenoscopy    . Total thyroidectomy  02/18/2015  . Back surgery    . Cystoscopy with ureteroscopy, stone basketry and stent placement  ~ 2006  . Abdominal hysterectomy  ~2012    "lap"  . Cardiac catheterization  04/2009    "clean"  . Laparoscopic cholecystectomy  05/2004  . Thyroidectomy N/A 02/18/2015    Procedure: TOTAL THYROIDECTOMY;  Surgeon: [name], MD;  Location: [place];  Service: General;  Laterality: N/A;    There were no vitals filed for this visit.  Visit Diagnosis:  Difficulty walking  Pain in left foot  Falls infrequently  Localized edema  Left ankle pain      Subjective Assessment - 11/15/15 0934    Subjective The injection hasn't helped the pain at all. Going to talk to the doctor today. Went to the [facility] but  had to get a medical release/clearance from the Dr. Forde Dandy.   Currently in Pain? Yes   Pain Score 4    Pain Location Ankle   Pain Orientation Left   Pain Descriptors / Indicators Throbbing   Pain Type Chronic pain   Pain Onset More than a month ago   Pain Frequency Intermittent   Aggravating Factors  walking, standing   Pain Relieving Factors brace, motrin                         [facility] Adult PT Treatment/Exercise - 11/15/15 1308    Knee/Hip Exercises: Aerobic   Nustep L1X 9 min   Knee/Hip Exercises: Standing   Heel Raises 15 reps   Heel Raises Limitations toe raises x 10, then left only x 5   Lateral Step Up 15 reps;Hand Hold: 2  Hand hold on bar   Lateral Step Up Limitations VC for technique   Forward Step Up 15 reps;Hand Hold: 2   Other Standing Knee Exercises wt shifting side to side and forward and back x 2 minutes each   Vasopneumatic   Number Minutes Vasopneumatic  15 minutes   Vasopnuematic Location  Ankle  Vasopneumatic Pressure Medium   Vasopneumatic Temperature  32   Ankle Exercises: Standing   SLS SLS with vectors all 4 directions x10 rt (at counter); weight shifting x 10 (at counter)   Heel Raises 10 reps  bil hand hold; 2 sets, with weight shift to left foot   Toe Raise 15 reps  bil   Other Standing Ankle Exercises Rocker board x 40 DF/PF;   Other Standing Ankle Exercises Heel and toe raises x20 with weight shift over to left foot x20   Ankle Exercises: Seated   Heel Raises 20 reps   Toe Raise 20 reps   Ankle Exercises: Stretches   Other Stretch dorsiflexor stretch 3 x 30 seconds, eversion and inversion stretch 3 x 30 second    Other Stretch Rocker board x 20 DF/PF                  PT Short Term Goals - 11/15/15 1036    PT SHORT TERM GOAL #1   Title Pt will be independent with initial HEP for ankle    Time 4   Period Weeks   Status Achieved   PT SHORT TERM GOAL #2   Title Pt will utilize a straight cane for safety with  ambulation to reduce falls to zero   Time 4   Period Weeks   Status Achieved   PT SHORT TERM GOAL #3   Title Pt will be able to regain AROM of Left dorsiflexion to begin standing strengthening exercises/ weight shifting with UE support   Time 4   Period Weeks   Status Achieved           PT Long Term Goals - 11/15/15 1037    PT LONG TERM GOAL #1   Title Pt will be independent with advanced HEP to address core and LE strength   Time 8   Period Weeks   Status On-going   PT LONG TERM GOAL #2   Title Pt will reduce LEFS limiation from 67 % to at leat 35% or less   Time 8   Period Weeks   Status On-going   PT LONG TERM GOAL #3   Title Pt will be able to perform heel raises bilaterally 25 /25 to show increase Plantarflexion strength   Time 8   Period Weeks   Status On-going   PT LONG TERM GOAL #4   Title Pt will be 2/10 or less in left lower extremity for all functional activities.   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 She is motivated and compliant with  HEP but still cannot do a full heel raise, and has poor motor control of the ankle secondary to physiological limitations and pain. Patient would benefit from therapy but progress continues to be slow.   PT Next Visit Plan continue with CKC  exercises as tolerated, reinforce motor control for ankle eversion and progress with band exercises as able.         Problem List Patient Active Problem List   Diagnosis Date Noted  . Neoplasm of uncertain behavior of thyroid gland, right lobe 02/18/2015  . DIABETES MELLITUS, TYPE II 04/14/2009  . DIABETES MELLITUS, WITH RENAL COMPLICATIONS [id number]  . HYPERLIPIDEMIA 04/14/2009  . OBESITY 04/14/2009  . ANXIETY DEPRESSION 04/14/2009  . HYPERTENSION 04/14/2009  . COPD 04/14/2009  . CHEST PAIN 04/14/2009  . TACHYCARDIA, HX OF 04/14/2009   Laury Axon, [place] 11/15/2015 1:11 PM PHONE:[phone] [place], [place], [address] Phone: [phone]   Fax:  [phone]  Name: [name] MRN: [id number] Date of Birth: 03/20/1966

## 2015-11-17 ENCOUNTER — Ambulatory Visit: Payer: 59 | Admitting: Physical Therapy

## 2015-11-17 DIAGNOSIS — Z9181 History of falling: Secondary | ICD-10-CM

## 2015-11-17 DIAGNOSIS — M25572 Pain in left ankle and joints of left foot: Secondary | ICD-10-CM | POA: Diagnosis not present

## 2015-11-17 DIAGNOSIS — R262 Difficulty in walking, not elsewhere classified: Secondary | ICD-10-CM

## 2015-11-17 DIAGNOSIS — R600 Localized edema: Secondary | ICD-10-CM

## 2015-11-17 DIAGNOSIS — M79672 Pain in left foot: Secondary | ICD-10-CM

## 2015-11-17 NOTE — Patient Instructions (Signed)
Continue doing HEP.  Voncille Lo, PT 11/17/2015 8:17 AM Phone: [phone] Fax: [phone]

## 2015-11-17 NOTE — Therapy (Signed)
[place], [place], [address] Phone: [phone]   Fax:  [phone]  Physical Therapy Treatment  Patient Details  Name: [name] MRN: [id number] Date of Birth: [birth year] Referring Provider: Wylene Simmer MD  Encounter Date: 11/17/2015      PT End of Session - 11/17/15 0759    Visit Number 13   Number of Visits 16   Date for PT Re-Evaluation 11/28/15   Authorization Type UMR   PT Start Time 0800   PT Stop Time 0846   PT Time Calculation (min) 46 min   Activity Tolerance Patient tolerated treatment well   Behavior During Therapy [facility] for tasks assessed/performed      Past Medical History  Diagnosis Date  . Hypertension   . GERD (gastroesophageal reflux disease)   . Fibromyalgia   . Proteinuria   . PONV (postoperative nausea and vomiting)   . Anginal pain ([place])     chest pain  heart cath done  . History of hiatal hernia   . Kidney stones   . High cholesterol   . Chronic bronchitis ([place])     "get it q yr"  . Hypothyroidism   . Type II diabetes mellitus ([place])   . Iron deficiency anemia     "comes and goes"  . Migraine     "@ least once/month" (02/18/2015)  . History of gout   . Cancer [facility])     Past Surgical History  Procedure Laterality Date  . Lumbar disc surgery  08/2011  . Colonoscopy    . Esophagogastroduodenoscopy    . Total thyroidectomy  02/18/2015  . Back surgery    . Cystoscopy with ureteroscopy, stone basketry and stent placement  ~ 2006  . Abdominal hysterectomy  ~2012    "lap"  . Cardiac catheterization  04/2009    "clean"  . Laparoscopic cholecystectomy  05/2004  . Thyroidectomy N/A 02/18/2015    Procedure: TOTAL THYROIDECTOMY;  Surgeon: [name], MD;  Location: [place];  Service: General;  Laterality: N/A;    There were no vitals filed for this visit.  Visit Diagnosis:  Difficulty walking  Pain in left foot  Falls infrequently  Localized  edema  Left ankle pain      Subjective Assessment - 11/17/15 0802    Subjective I am discouraged because the injection did not work,  I need to talk to [place] about scheduling a surgery..  unable to stand for 45 min in order to shop and I must sit down.  Sometimes my pain is 7/10 but shoots up to 10/10 especially later in the day.  I am  frustrated with my pain not getting better.  I really want to return to the job I love.   Pertinent History DM, thyroidectomy in March 2016, HBP, GERD, Fibromyalgia   Limitations Walking;Standing;House hold activities;Other (comment)   How long can you sit comfortably? unlimited   How long can you stand comfortably? 45 minutes in the morning max before elevating   How long can you walk comfortably? 45 minutes   Diagnostic tests MRI  sinus tarsi syndrome and torn ATFL on left   Currently in Pain? Yes   Pain Score 7   can go to 10/10 while doing functional activities   Pain Location Ankle   Pain Orientation Left   Pain Descriptors / Indicators Throbbing   Pain Type Chronic pain   Pain Onset More than a month ago   Pain Frequency Intermittent  Pain Score 0            [facility] PT Assessment - 11/17/15 0807    Observation/Other Assessments   Lower Extremity Functional Scale  69% limtation  67% limitation   AROM   Left Ankle Dorsiflexion 6   Left Ankle Plantar Flexion 48   Left Ankle Inversion 21   Left Ankle Eversion 10   Strength   Right Hip Flexion 5/5   Right Hip ABduction 4/5   Left Hip Flexion 4-/5   Left Hip ABduction 4-/5   Left Ankle Dorsiflexion 4-/5   Left Ankle Plantar Flexion 3-/5  in standing.  pt compensates with momentum and UE support   Palpation   Palpation comment Pt with tendernees over left ATFL ligament                      [facility] Adult PT Treatment/Exercise - 11/17/15 0807    Ambulation/Gait   Stairs Yes   Stairs Assistance 6: Modified independent (Device/Increase time)   Stair Management Technique  Two rails  step to pattern and then step over step for 16 steps   Gait Comments antalgic gait using straight cane   Knee/Hip Exercises: Stretches   Other Knee/Hip Stretches DF stretch with band 2 x 30 seconds   Knee/Hip Exercises: Aerobic   Nustep L5X 9 min   Knee/Hip Exercises: Machines for Strengthening   Cybex Leg Press bil PF with one plate x 15 and then sLs with left x 10 with no plates   Knee/Hip Exercises: Standing   Heel Raises 15 reps  x2   Heel Raises Limitations toe raises x 10, left limited in range   Lateral Step Up 15 reps;Hand Hold: 2  hand hold at counter   Lateral Step Up Limitations VC for technique   Forward Step Up 15 reps;Hand Hold: 2   Functional Squat 15 reps   Functional Squat Limitations to 60 degrees bil VC for execution   Other Standing Knee Exercises wt shifting side to side and forward and back plus SLS on left with Right hip flex with add and abd for wt shifting in SLS ankle x 10   Vasopneumatic   Number Minutes Vasopneumatic  15 minutes   Vasopnuematic Location  Ankle   Vasopneumatic Pressure Medium   Vasopneumatic Temperature  32   Ankle Exercises: Standing   SLS SLS with vectors all 4 directions x10 rt (at counter); weight shifting x 10 (at counter)   Heel Raises 10 reps  bil hand hold; 2 sets, with weight shift to left foot   Toe Raise 15 reps  bil   Other Standing Ankle Exercises Rocker board x 25 DF/PF;   Other Standing Ankle Exercises Heel and toe raises x20 with weight shift over to left foot x20   Ankle Exercises: Seated   Heel Raises 20 reps   Toe Raise 20 reps   Ankle Exercises: Stretches   Other Stretch dorsiflexor stretch 3 x 30 seconds, eversion and inversion stretch 3 x 30 second    Other Stretch Rocker board x 20 DF/PF                PT Education - 11/17/15 0817    Education provided Yes   Education Details reviewed HEP . and discussed need for core exercise for proximal stability.     Person(s) Educated Patient    Methods Explanation;Demonstration;Verbal cues   Comprehension Verbalized understanding;Returned demonstration  PT Short Term Goals - 11/15/15 1036    PT SHORT TERM GOAL #1   Title Pt will be independent with initial HEP for ankle    Time 4   Period Weeks   Status Achieved   PT SHORT TERM GOAL #2   Title Pt will utilize a straight cane for safety with ambulation to reduce falls to zero   Time 4   Period Weeks   Status Achieved   PT SHORT TERM GOAL #3   Title Pt will be able to regain AROM of Left dorsiflexion to begin standing strengthening exercises/ weight shifting with UE support   Time 4   Period Weeks   Status Achieved           PT Long Term Goals - 11/17/15 0905    PT LONG TERM GOAL #1   Title Pt will be independent with advanced HEP to address core and LE strength   Time 8   Period Weeks   Status On-going   PT LONG TERM GOAL #2   Title Pt will reduce LEFS limiation from 67 % to at leat 35% or less   Baseline Pt LEFS 69%   Time 8   Period Weeks   Status On-going   PT LONG TERM GOAL #3   Title Pt will be able to perform heel raises bilaterally 25 /25 to show increase Plantarflexion strength   Baseline PT with left PF weakness 3-/5  unable to clear full AROM in standing   Time 8   Period Weeks   Status On-going   PT LONG TERM GOAL #4   Title Pt will be 2/10 or less in left lower extremity for all functional activities.   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 Pain limitation   Time 8   Period Weeks   Status On-going   PT LONG TERM GOAL #7   Title Pt will utilized orthotic to improve pes planus and provide at least 50% increased comfort with standing activites for longer than 1 hour   Baseline Pt still with pain but utilizes OTC orthotic   Time 8   Period Weeks   Status Partially Met               Plan - 11/17/15 0844    Clinical Impression Statement Pt continues iwth decreased strength and ROM and progress is slow and pt is now at pain from 7/10 to 10/10 wiith functional activities, negotiating stairs etc.   Pain at Left  ATFL and sinus tarsi . Pt with 3-/5 plantarflex and pt utlizes cane for ambulation but still exhibits antalgic gait.. AROM of L Dorsiflexion is 6 and PF is 48. 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 Doran Durand  for possible surgical interventrion.  Pt  is compliant with all exercise but  it would be difficult to return to job as Patent examiner.  Pt desperately wants to return to work which she loves.  Will  continue for 3 more visits to advance HEP and return pt to Dr Doran Durand for further workup.    Pt will benefit from skilled therapeutic intervention in order to improve on the following deficits Abnormal gait;Decreased balance;Decreased mobility;Decreased endurance;Decreased range of motion;Decreased strength;Increased edema;Difficulty walking;Increased fascial restricitons;Postural dysfunction;Improper body mechanics;Pain;Obesity   PT Next Visit Plan PLease given core clams/ abd and hip extension / abdominal exercise for proximal stability for HEP for DC  pt sent for further  workup by DR. Hewitt/    PT Home Exercise Plan Note to Dr. Doran Durand and LEFS done.  Continue  HEP and prepare for DC and return to DR Doran Durand . 3 visits left for advancing HEP   Consulted and Agree with Plan of Care Patient        Problem List Patient Active Problem List   Diagnosis Date Noted  . Neoplasm of uncertain behavior of thyroid gland, right lobe 02/18/2015  . DIABETES MELLITUS, TYPE II 04/14/2009  . DIABETES MELLITUS, WITH RENAL COMPLICATIONS 44/92/5241  . HYPERLIPIDEMIA 04/14/2009  . OBESITY 04/14/2009  . ANXIETY DEPRESSION 04/14/2009  . HYPERTENSION 04/14/2009  . COPD 04/14/2009  . CHEST PAIN 04/14/2009  . TACHYCARDIA, HX OF 04/14/2009   Voncille Lo, PT 11/17/2015 9:09 AM Phone: [phone] Fax: [address] [place], [place], [address] Phone: [phone]   Fax:  [phone]  Name: [name] MRN: [id number] Date of Birth: 11/19/1966

## 2015-11-17 NOTE — Progress Notes (Signed)
Patient ID: [name], female   DOB: [birth year], 49 y.o.   MRN: [id number]  Subjective: 49 year old female presents the office they for follow-up evaluation of ulceration left big toe. She states that since last appointment her left big toe has been looking much better. She denies any drainage or pus. Denies any swelling or redness or red streaks. She is still undergoing physical therapy and she is being treated by Dr. Doran Durand for left ankle pain. She states that she is concerned surgical intervention with upper [place] for left ankle.  She denies any systemic complaints such as fevers, chills, nausea, vomiting. No calf pain, chest pain, shortness of breath.  Objective: AAO 3, NAD DP/PT pulses palpable, CRT less than 3 seconds Protective sensation decreased with Simms Weinstein monofilament On the plantar aspect of left hallux is a hyperkeratotic lesion. Upon debridement there is a. Small superficial pinpoint granular wound present. There is no probing, undermining, tunneling. There is no edema, erythema, increased warmth of the hallux. There is no other open lesions or pre-ulcer lesions identified bilaterally. There is no conical signs of infection at this time. The area on the ankle from with the brace is rubbing has healed there is no other open lesions identified at this time. There does continue be tenderness along the lateral aspect of the ankle course the ATFL mostly. There is no specific area pinpoint bony tenderness or pain the vibratory sensation. No pain with calf compression swelling, warmth, erythema.  Assessment: 49 year old female left healing plantar hallux ulceration  Plan:  -Treatment options discussed including all alternatives, risks, and complications -Hyperkeratotic lesions/wound left hallux debridement without complication. Continued antibiotic ointment over on the area daily and a dressing. Monitor for any clinical signs or symptoms of infection and directed to call  the office immediately should any occur or go to the ER. -Continue physical therapy and following up with Dr. Doran Durand for left ankle pain. -Monitor for any clinical signs or symptoms of infection and directed to call the office immediately should any occur or go to the ER. -Follow-up in 4 weeks or sooner if any problems arise. In the meantime, encouraged to call the office with any questions, concerns, change in symptoms.   Celesta Gentile, DPM

## 2015-11-18 ENCOUNTER — Ambulatory Visit: Payer: 59 | Admitting: Podiatry

## 2015-11-22 ENCOUNTER — Ambulatory Visit: Payer: 59 | Admitting: Physical Therapy

## 2015-11-22 DIAGNOSIS — R262 Difficulty in walking, not elsewhere classified: Secondary | ICD-10-CM

## 2015-11-22 DIAGNOSIS — M25572 Pain in left ankle and joints of left foot: Secondary | ICD-10-CM | POA: Diagnosis not present

## 2015-11-22 DIAGNOSIS — Z9181 History of falling: Secondary | ICD-10-CM

## 2015-11-22 DIAGNOSIS — R600 Localized edema: Secondary | ICD-10-CM

## 2015-11-22 DIAGNOSIS — M79672 Pain in left foot: Secondary | ICD-10-CM

## 2015-11-22 NOTE — Patient Instructions (Signed)
   PELVIC TILT  Lie on back, legs bent. Exhale, tilting top of pelvis back, pubic bone up, to flatten lower back. Inhale, rolling pelvis opposite way, top forward, pubic bone down, arch in back. Repeat __10__ times. Do __2__ sessions per day. Copyright  VHI. All rights reserved.    Isometric Hold With Pelvic Floor (Hook-Lying)  Lie with hips and knees bent. Slowly inhale, and then exhale. Pull navel toward spine and tighten pelvic floor. Hold for __10_ seconds. Continue to breathe in and out during hold. Rest for _10__ seconds. Repeat __10_ times. Do __2-3_ times a day.   Knee Fold  Lie on back, legs bent, arms by sides. Exhale, lifting knee to chest. Inhale, returning. Keep abdominals flat, navel to spine. Repeat __10__ times, alternating legs. Do __2__ sessions per day.  Knee Drop  Keep pelvis stable. Without rotating hips, slowly drop knee to side, pause, return to center, bring knee across midline toward opposite hip. Feel obliques engaging. Repeat for ___10_ times each leg.      Heel Slide to Straight   Slide one leg down to straight. Return. Be sure pelvis does not rock forward, tilt, rotate, or tip to side. Do _10__ times. Restabilize pelvis. Repeat with other leg. Do __1-2_ sets, __2_ times per day.  http://ss.exer.us/16   Copyright  VHI. All rights reserved.  Bridge    Lie back, legs bent. Inhale, pressing hips up. Keeping ribs in, lengthen lower back. Exhale, rolling down along spine from top. Repeat __10-15__ times. Do _2___ sessions per day.  http://pm.exer.us/55   Copyright  VHI. All rights reserved.  Abduction: Clam (Eccentric) - Side-Lying    Lie on side with knees bent. Lift top knee, keeping feet together. Keep trunk steady. Slowly lower for 3-5 seconds. _15__ reps per set, __2_ sets per day, _7__ days per week.

## 2015-11-23 NOTE — Therapy (Signed)
[facility] [address] [place], [place], [address] Phone: [phone]   Fax:  [phone]  Physical Therapy Treatment  Patient Details  Name: [name] MRN: [id number] Date of Birth: 12/29/1965 Referring Provider: hewitt, John MD  Encounter Date: 11/22/2015      PT End of Session - 11/22/15 0922    Visit Number 14   Number of Visits 16   Date for PT Re-Evaluation 11/28/15   Authorization Type UMR   PT Start Time 0920   PT Stop Time 1025   PT Time Calculation (min) 65 min      Past Medical History  Diagnosis Date  . Hypertension   . GERD (gastroesophageal reflux disease)   . Fibromyalgia   . Proteinuria   . PONV (postoperative nausea and vomiting)   . Anginal pain (HCC)     chest pain  heart cath done  . History of hiatal hernia   . Kidney stones   . High cholesterol   . Chronic bronchitis (HCC)     "get it q yr"  . Hypothyroidism   . Type II diabetes mellitus (HCC)   . Iron deficiency anemia     "comes and goes"  . Migraine     "@ least once/month" (02/18/2015)  . History of gout   . Cancer (HCC)     Past Surgical History  Procedure Laterality Date  . Lumbar disc surgery  08/2011  . Colonoscopy    . Esophagogastroduodenoscopy    . Total thyroidectomy  02/18/2015  . Back surgery    . Cystoscopy with ureteroscopy, stone basketry and stent placement  ~ 2006  . Abdominal hysterectomy  ~2012    "lap"  . Cardiac catheterization  04/2009    "clean"  . Laparoscopic cholecystectomy  05/2004  . Thyroidectomy N/A 02/18/2015    Procedure: TOTAL THYROIDECTOMY;  Surgeon: Todd Gerkin, MD;  Location: [facility] OR;  Service: General;  Laterality: N/A;    There were no vitals filed for this visit.  Visit Diagnosis:  Difficulty walking  Pain in left foot  Falls infrequently  Localized edema  Left ankle pain      Subjective Assessment - 11/22/15 0921    Currently in Pain? Yes   Pain Score 4    Pain  Location Ankle   Pain Orientation Left                         [facility] Adult PT Treatment/Exercise - 11/22/15 0937    Lumbar Exercises: Supine   Ab Set 10 reps   Clam 10 reps   Clam Limitations bilateral and unilateral   Heel Slides 10 reps   Bent Knee Raise 10 reps   Bridge 15 reps   Bridge Limitations shoulder bridge   Lumbar Exercises: Sidelying   Clam 15 reps   Clam Limitations bilateral   Knee/Hip Exercises: Standing   Heel Raises 15 reps   Heel Raises Limitations on edge of step   Lateral Step Up 15 reps;Step Height: 6"   Forward Step Up 15 reps   Vasopneumatic   Number Minutes Vasopneumatic  15 minutes   Vasopnuematic Location  Ankle   Vasopneumatic Pressure Medium   Vasopneumatic Temperature  32   Ankle Exercises: Stretches   Gastroc Stretch 4 reps;30 seconds  edge of step                PT Education - 11/22/15 0956    Education   provided Yes   Education Details Prepilates, bridge, side clam   Person(s) Educated Patient   Methods Explanation;Handout   Comprehension Verbalized understanding          PT Short Term Goals - 11/15/15 1036    PT SHORT TERM GOAL #1   Title Pt will be independent with initial HEP for ankle    Time 4   Period Weeks   Status Achieved   PT SHORT TERM GOAL #2   Title Pt will utilize a straight cane for safety with ambulation to reduce falls to zero   Time 4   Period Weeks   Status Achieved   PT SHORT TERM GOAL #3   Title Pt will be able to regain AROM of Left dorsiflexion to begin standing strengthening exercises/ weight shifting with UE support   Time 4   Period Weeks   Status Achieved           PT Long Term Goals - 11/17/15 0905    PT LONG TERM GOAL #1   Title Pt will be independent with advanced HEP to address core and LE strength   Time 8   Period Weeks   Status On-going   PT LONG TERM GOAL #2   Title Pt will reduce LEFS limiation from 67 % to at leat 35% or less   Baseline Pt LEFS 69%    Time 8   Period Weeks   Status On-going   PT LONG TERM GOAL #3   Title Pt will be able to perform heel raises bilaterally 25 /25 to show increase Plantarflexion strength   Baseline PT with left PF weakness 3-/5  unable to clear full AROM in standing   Time 8   Period Weeks   Status On-going   PT LONG TERM GOAL #4   Title Pt will be 2/10 or less in left lower extremity for all functional activities.   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 Pain limitation   Time 8   Period Weeks   Status On-going   PT LONG TERM GOAL #7   Title Pt will utilized orthotic to improve pes planus and provide at least 50% increased comfort with standing activites for longer than 1 hour   Baseline Pt still with pain but utilizes OTC orthotic   Time 8   Period Weeks   Status Partially Met               Plan - 11/22/15 4656    Clinical Impression Statement Pt reports she is seeing Dr Doran Durand  this Friday to discuss surgery scheduling. Pt instructed in core strengthening as recommended by PT last visit and given HEP.   PT Next Visit Plan Review Core HEP        Problem List Patient Active Problem List  Diagnosis Date Noted  . Neoplasm of uncertain behavior of thyroid gland, right lobe 02/18/2015  . DIABETES MELLITUS, TYPE II 04/14/2009  . DIABETES MELLITUS, WITH RENAL COMPLICATIONS 88/28/0034  . HYPERLIPIDEMIA 04/14/2009  . OBESITY 04/14/2009  .  ANXIETY DEPRESSION 04/14/2009  . HYPERTENSION 04/14/2009  . COPD 04/14/2009  . CHEST PAIN 04/14/2009  . TACHYCARDIA, HX OF 04/14/2009    Dorene Ar, PTA 11/23/2015, 8:38 AM  [place], [place], [address] Phone: [phone]   Fax:  [phone]  Name: [name] MRN: [id number] Date of Birth: [birth year]

## 2015-11-24 ENCOUNTER — Ambulatory Visit: Payer: 59 | Admitting: Physical Therapy

## 2015-11-24 DIAGNOSIS — M25572 Pain in left ankle and joints of left foot: Secondary | ICD-10-CM

## 2015-11-24 DIAGNOSIS — R262 Difficulty in walking, not elsewhere classified: Secondary | ICD-10-CM

## 2015-11-24 DIAGNOSIS — Z9181 History of falling: Secondary | ICD-10-CM

## 2015-11-24 DIAGNOSIS — M79672 Pain in left foot: Secondary | ICD-10-CM

## 2015-11-24 DIAGNOSIS — R600 Localized edema: Secondary | ICD-10-CM

## 2015-11-24 NOTE — Therapy (Signed)
[place], [place], [address] Phone: [phone]   Fax:  [phone]  Physical Therapy Treatment  Patient Details  Name: [name] MRN: [id number] Date of Birth: 1965/12/21 Referring Provider: Wylene Simmer MD  Encounter Date: 11/24/2015      PT End of Session - 11/24/15 0926    Visit Number 15   Number of Visits 16   Date for PT Re-Evaluation 11/28/15   Authorization Type UMR   PT Start Time 0925   PT Stop Time 1025   PT Time Calculation (min) 60 min      Past Medical History  Diagnosis Date  . Hypertension   . GERD (gastroesophageal reflux disease)   . Fibromyalgia   . Proteinuria   . PONV (postoperative nausea and vomiting)   . Anginal pain ([facility])     chest pain  heart cath done  . History of hiatal hernia   . Kidney stones   . High cholesterol   . Chronic bronchitis ([facility])     "get it q yr"  . Hypothyroidism   . Type II diabetes mellitus ([facility])   . Iron deficiency anemia     "comes and goes"  . Migraine     "@ least once/month" (02/18/2015)  . History of gout   . Cancer [facility])     Past Surgical History  Procedure Laterality Date  . Lumbar disc surgery  08/2011  . Colonoscopy    . Esophagogastroduodenoscopy    . Total thyroidectomy  02/18/2015  . Back surgery    . Cystoscopy with ureteroscopy, stone basketry and stent placement  ~ 2006  . Abdominal hysterectomy  ~2012    "lap"  . Cardiac catheterization  04/2009    "clean"  . Laparoscopic cholecystectomy  05/2004  . Thyroidectomy N/A 02/18/2015    Procedure: TOTAL THYROIDECTOMY;  Surgeon: [name], MD;  Location: [place];  Service: General;  Laterality: N/A;    There were no vitals filed for this visit.  Visit Diagnosis:  Difficulty walking  Pain in left foot  Falls infrequently  Localized edema  Left ankle pain      Subjective Assessment - 11/24/15 0927    Subjective My abs and inner thighs are sore from  the core exercises. My foot hurts. My head hurts.    Currently in Pain? Yes   Pain Score 3   or 4    Pain Location Ankle   Pain Orientation Left   Pain Descriptors / Indicators Throbbing   Aggravating Factors  walking/standing   Pain Relieving Factors brace, motrin                         [facility] Adult PT Treatment/Exercise - 11/24/15 0931    Ambulation/Gait   Stairs Yes   Stairs Assistance 6: Modified independent (Device/Increase time)   Stair Management Technique One rail Right;Alternating pattern   Number of Stairs 20   Height of Stairs 6   Gait Comments pt able to perform reciprocal strairs with one hand rail and min-moderate increase in pain safely.    Lumbar Exercises: Supine   Ab Set 10 reps   Clam 10 reps   Clam Limitations bilateral and unilateral   Heel Slides 20 reps   Bent Knee Raise 20 reps   Bridge 15 reps   Bridge Limitations shoulder bridge   Lumbar Exercises: Sidelying   Clam 15 reps   Clam Limitations bilateral  Hip Abduction 15 reps   Hip Abduction Weights (lbs) with abset   Knee/Hip Exercises: Machines for Strengthening   Cybex Leg Press bil PF with one plate x 15 and then sLs with left x 10 with no plates   Knee/Hip Exercises: Standing   Heel Raises 15 reps   Heel Raises Limitations on edge of step   Functional Squat 15 reps   Functional Squat Limitations to 60 degrees bil VC for execution   Other Standing Knee Exercises wt shifting side to side and forward and back plus SLS on left with Right hip flex with add and abd for wt shifting in SLS ankle x 10   Vasopneumatic   Number Minutes Vasopneumatic  15 minutes   Vasopnuematic Location  Ankle   Vasopneumatic Pressure Medium   Vasopneumatic Temperature  32                  PT Short Term Goals - 11/15/15 1036    PT SHORT TERM GOAL #1   Title Pt will be independent with initial HEP for ankle    Time 4   Period Weeks   Status Achieved   PT SHORT TERM GOAL #2   Title Pt  will utilize a straight cane for safety with ambulation to reduce falls to zero   Time 4   Period Weeks   Status Achieved   PT SHORT TERM GOAL #3   Title Pt will be able to regain AROM of Left dorsiflexion to begin standing strengthening exercises/ weight shifting with UE support   Time 4   Period Weeks   Status Achieved           PT Long Term Goals - 11/24/15 1037    PT LONG TERM GOAL #1   Title Pt will be independent with advanced HEP to address core and LE strength   Time 8   Period Weeks   Status On-going   PT LONG TERM GOAL #2   Title Pt will reduce LEFS limiation from 67 % to at leat 35% or less   Baseline Pt LEFS 69%   Time 8   Period Weeks   Status On-going   PT LONG TERM GOAL #3   Title Pt will be able to perform heel raises bilaterally 25 /25 to show increase Plantarflexion strength   Baseline PT with left PF weakness 3-/5  unable to clear full AROM in standing   Time 8   Period Weeks   Status On-going   PT LONG TERM GOAL #4   Title Pt will be 2/10 or less in left lower extremity for all functional activities.   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 Pain limitation   Time 8   Period Weeks   Status On-going   PT LONG TERM GOAL #7   Title Pt will utilized orthotic to  improve pes planus and provide at least 50% increased comfort with standing activites for longer than 1 hour   Baseline has order for custom orthotics   Time 8   Period Weeks   Status Partially  Met               Plan - 11/24/15 1035    Clinical Impression Statement Review of Core/Hip strenthening HEP issued last visit. Pt sees Surgeon tomorrow to discuss options. Pt able to negotiate steps with one hadrail step over step safely with mild increase in pain. LTG# 5 met.    PT Next Visit Plan Review and DC next visit- see what surgeon said        Problem List Patient Active Problem List   Diagnosis Date Noted  . Neoplasm of uncertain behavior of thyroid gland, right lobe 02/18/2015  . DIABETES MELLITUS, TYPE II 04/14/2009  . DIABETES MELLITUS, WITH RENAL COMPLICATIONS 71/24/5809  . HYPERLIPIDEMIA 04/14/2009  . OBESITY 04/14/2009  . ANXIETY DEPRESSION 04/14/2009  . HYPERTENSION 04/14/2009  . COPD 04/14/2009  . CHEST PAIN 04/14/2009  . TACHYCARDIA, HX OF 04/14/2009    Dorene Ar, PTA 11/24/2015, 10:46 AM  [facility] [address] [place], [place], [address] Phone: [phone]   Fax:  [phone]  Name: [name] MRN: [id number] Date of Birth: Aug 25, 1966

## 2015-11-29 ENCOUNTER — Ambulatory Visit: Payer: 59 | Admitting: Physical Therapy

## 2015-11-29 DIAGNOSIS — Z9181 History of falling: Secondary | ICD-10-CM

## 2015-11-29 DIAGNOSIS — M79672 Pain in left foot: Secondary | ICD-10-CM

## 2015-11-29 DIAGNOSIS — R600 Localized edema: Secondary | ICD-10-CM

## 2015-11-29 DIAGNOSIS — R262 Difficulty in walking, not elsewhere classified: Secondary | ICD-10-CM

## 2015-11-29 DIAGNOSIS — M25572 Pain in left ankle and joints of left foot: Secondary | ICD-10-CM

## 2015-11-29 NOTE — Patient Instructions (Signed)
Pt given all previous HEP.   Voncille Lo, PT 11/29/2015 2:47 PM Phone: [phone] Fax: [phone]

## 2015-11-29 NOTE — Therapy (Signed)
[place], [place], [address] Phone: [phone]   Fax:  [phone]  Physical Therapy Treatmen/Discharge Note  Patient Details  Name: [name] MRN: [id number] Date of Birth: 11/22/1966 Referring Provider: Wylene Simmer MD  Encounter Date: 11/29/2015      PT End of Session - 11/29/15 0757    Visit Number 16   Number of Visits 16   Date for PT Re-Evaluation 11/28/15   Authorization Type UMR   PT Start Time 0758   PT Stop Time 0840   PT Time Calculation (min) 42 min   Activity Tolerance Patient tolerated treatment well   Behavior During Therapy [facility] for tasks assessed/performed      Past Medical History  Diagnosis Date  . Hypertension   . GERD (gastroesophageal reflux disease)   . Fibromyalgia   . Proteinuria   . PONV (postoperative nausea and vomiting)   . Anginal pain ([place])     chest pain  heart cath done  . History of hiatal hernia   . Kidney stones   . High cholesterol   . Chronic bronchitis ([place])     "get it q yr"  . Hypothyroidism   . Type II diabetes mellitus ([place])   . Iron deficiency anemia     "comes and goes"  . Migraine     "@ least once/month" (02/18/2015)  . History of gout   . Cancer [facility])     Past Surgical History  Procedure Laterality Date  . Lumbar disc surgery  08/2011  . Colonoscopy    . Esophagogastroduodenoscopy    . Total thyroidectomy  02/18/2015  . Back surgery    . Cystoscopy with ureteroscopy, stone basketry and stent placement  ~ 2006  . Abdominal hysterectomy  ~2012    "lap"  . Cardiac catheterization  04/2009    "clean"  . Laparoscopic cholecystectomy  05/2004  . Thyroidectomy N/A 02/18/2015    Procedure: TOTAL THYROIDECTOMY;  Surgeon: [name], MD;  Location: [place];  Service: General;  Laterality: N/A;    There were no vitals filed for this visit.  Visit Diagnosis:  Difficulty walking  Pain in left foot  Falls  infrequently  Localized edema  Left ankle pain      Subjective Assessment - 11/29/15 0800    Subjective I went to the Dr. Doran Durand and I had an injection in my sinus tarsi.  We may have to do surgery but we will try injections first . I need a copy of my home exericises just to make sure I have them all.  I am doing exericises every day.   Pertinent History DM, thyroidectomy in March 2016, HBP, GERD, Fibromyalgia   Limitations Walking;Standing;House hold activities;Other (comment)   How long can you sit comfortably? unlimited   How long can you stand comfortably? 30 to 45 min   How long can you walk comfortably? 30 to 45 min    Diagnostic tests MRI  sinus tarsi syndrome and torn ATFL on left   Patient Stated Goals walk without pain, return to work   Currently in Pain? Yes   Pain Score 5    Pain Location Ankle   Pain Orientation Left   Pain Descriptors / Indicators Throbbing   Pain Type Chronic pain   Pain Onset More than a month ago   Pain Frequency Intermittent   Aggravating Factors  walking/standing   Pain Relieving Factors ice    Pain Score 0  Pain Location Foot   Pain Orientation Left   Pain Descriptors / Indicators Aching;Sharp   Pain Type Chronic pain            [facility] PT Assessment - 11/29/15 0825    Observation/Other Assessments   Lower Extremity Functional Scale  30/80  62,5/5 limitation   AROM   Left Ankle Dorsiflexion 2   Left Ankle Plantar Flexion 48   Left Ankle Inversion 21   Left Ankle Eversion 10   Strength   Right Hip Flexion 5/5   Right Hip ABduction 4+/5   Left Hip Flexion 4-/5   Left Hip ABduction 4-/5   Left Ankle Dorsiflexion 4-/5   Left Ankle Plantar Flexion 3-/5  in standing.  pt compensates with momentum and UE support   Palpation   Palpation comment Pt with tendernees over left ATFL ligament                      [facility] Adult PT Treatment/Exercise - 11/29/15 0825    Posture/Postural Control   Posture Comments Pt with  navicular drop on left foot with pesplanus    Lumbar Exercises: Supine   Ab Set 10 reps   Clam 10 reps   Clam Limitations bilateral and unilateral   Heel Slides 20 reps   Bent Knee Raise 20 reps   Bridge 15 reps   Bridge Limitations shoulder bridge   Other Supine Lumbar Exercises sitting with red t band for strengthening for DF and PF x 15   Lumbar Exercises: Sidelying   Clam 15 reps   Clam Limitations bilateral   Hip Abduction 15 reps   Hip Abduction Weights (lbs) with abset   Knee/Hip Exercises: Standing   Heel Raises 15 reps   Heel Raises Limitations on edge of step   Lateral Step Up 15 reps;Step Height: 6"   Lateral Step Up Limitations Pt with eccentric lowering   Forward Step Up 15 reps   Functional Squat 15 reps   Functional Squat Limitations to 60 degrees bil VC for execution   Other Standing Knee Exercises wt shifting side to side and forward and back plus SLS on left with Right hip flex with add and abd for wt shifting in SLS ankle x 10   Vasopneumatic   Number Minutes Vasopneumatic  --   Vasopnuematic Location  --   Vasopneumatic Pressure --   Vasopneumatic Temperature  --   Ankle Exercises: Standing   Heel Raises 10 reps  bil hand hold; 2 sets,weakness on left ankle/notfull ROM                PT Education - 11/29/15 0830    Education provided Yes   Education Details Reviewed HEP and theraband with progression for ankle strength red,green and blue, safety on steps,     Person(s) Educated Patient   Methods Explanation;Demonstration   Comprehension Verbalized understanding;Returned demonstration          PT Short Term Goals - 11/29/15 0814    PT SHORT TERM GOAL #1   Title Pt will be independent with initial HEP for ankle    Time 4   Period Weeks   Status Achieved   PT SHORT TERM GOAL #2   Title Pt will utilize a straight cane for safety with ambulation to reduce falls to zero   Time 4   Period Weeks   Status Achieved   PT SHORT TERM GOAL #3    Title Pt will be able  to regain AROM of Left dorsiflexion to begin standing strengthening exercises/ weight shifting with UE support   Time 4   Period Weeks   Status Achieved           PT Long Term Goals - 11/29/15 0815    PT LONG TERM GOAL #1   Title Pt will be independent with advanced HEP to address core and LE strength   Time 8   Period Weeks   Status Achieved   PT LONG TERM GOAL #2   Title Pt will reduce LEFS limiation from 67 % to at leat 35% or less   Baseline Pt LEFS eval 69%  DC  62.5%    Time 8   Period Weeks   Status Not Met   PT LONG TERM GOAL #3   Title Pt will be able to perform heel raises bilaterally 25 /25 to show increase Plantarflexion strength   Baseline PT with left PF weakness 3-/5  unable to clear full AROM in standing   Time 8   Period Weeks   Status Not Met   PT LONG TERM GOAL #4   Title Pt will be 2/10 or less in left lower extremity for all functional activities.   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 limiteid by weakness and pain   Time 8   Period Weeks   Status Not Met   PT LONG TERM GOAL #7   Title Pt will utilized orthotic to improve pes planus and provide at least 50% increased comfort with standing activites for longer than 1 hour   Baseline Pt has over the counter orthotic for support for arch but pain is still 5/10 in standing.  No longer than 30 minutes   Time 8   Period Weeks   Status Partially Met                Plan - 11/29/15 0810    Clinical Impression Statement Pt returned to Dr. Victorino Dike and recieved injection in the sinus tarsi of left foot. Pt pain levele today is 5/10.  Pt with functional weakness in standing unable to heel raise on left due to pain and weakness. LTG not met are #2,3,4 and 6.  LEFS at evaluation was 69%, today pt limitation is minimially improved to 62.5%.  Pt has been compliant with exercise and HEP. 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  Pt is independent with HEP but must have someone present for steps due to left ankle instablity. Pt with 4-/5 dorsiflexion and 3-/5  plantarflexion. on left ankle.  pt with left navicular drop in standing and pain at 5/10.  Pt unalble to walk for longer than 30 minutes without pain limiting.Marland Kitchen Pt AROM of left DF 2/ PF  48 due to weakness/limtied by pain.  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  Pt  will continue working with Dr. Doran Durand and continue HEP for strengthening at home.  She has decreased Active AROM of left DF   PT Next Visit Plan DC   PT Home Exercise Plan Continue HEP and continue being monitored by Dr. Doran Durand for pain control, further intervention with injection/possible surgical intervention   Consulted and Agree with Plan of Care Patient        Problem List Patient Active Problem List   Diagnosis Date Noted  . Neoplasm of uncertain behavior of thyroid gland, right lobe 02/18/2015  . DIABETES MELLITUS, TYPE II 04/14/2009  . DIABETES MELLITUS, WITH RENAL COMPLICATIONS 78/55/4768  . HYPERLIPIDEMIA 04/14/2009  . OBESITY 04/14/2009  . ANXIETY DEPRESSION 04/14/2009  . HYPERTENSION 04/14/2009  . COPD 04/14/2009  . CHEST PAIN 04/14/2009  . TACHYCARDIA, HX OF 04/14/2009    Voncille Lo, PT 11/29/2015 3:01 PM Phone: [phone] Fax: [place], [place], [address] Phone: [phone]   Fax:  [phone]  Name: [name] MRN: [id number] Date of Birth: Apr 24, 1966   PHYSICAL THERAPY DISCHARGE SUMMARY  Visits from Start of Care: 16  Current functional level related to goals / functional outcomes: See above,  LTGs # 2,3,4 and 6 not met,  LTG for HEP met , all others partially met   Remaining deficits: Left ankle strength decreased DF 4-5/ and PF 3-/5   AROM DF 2, PF 48   Education / Equipment: HEP  Plan: Patient agrees to discharge.  Patient goals were not met. Patient is being discharged due to lack of progress.  ?????   And pt limited by pain and unable to functionally stand greater than 30 minutes .  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  Returning to Dr. Doran Durand for possible further medical intervention.  Conservative PT has not been able to alleviate pain for pt.          Voncille Lo, PT 11/29/2015 3:01 PM Phone: [phone] Fax: [phone]

## 2015-12-01 ENCOUNTER — Encounter: Payer: Self-pay | Admitting: Physical Therapy

## 2015-12-09 ENCOUNTER — Ambulatory Visit: Payer: 59 | Admitting: Podiatry

## 2015-12-21 ENCOUNTER — Ambulatory Visit: Payer: 59 | Admitting: Pharmacist

## 2015-12-23 DIAGNOSIS — M25572 Pain in left ankle and joints of left foot: Secondary | ICD-10-CM | POA: Diagnosis not present

## 2015-12-27 ENCOUNTER — Ambulatory Visit (INDEPENDENT_AMBULATORY_CARE_PROVIDER_SITE_OTHER): Payer: Self-pay | Admitting: Family Medicine

## 2015-12-27 VITALS — BP 110/88 | Wt 277.0 lb

## 2015-12-27 DIAGNOSIS — E119 Type 2 diabetes mellitus without complications: Secondary | ICD-10-CM

## 2015-12-27 NOTE — Patient Instructions (Signed)
1)  Continue testing regularly, consider wearing CGM in an effort to reduce finger sticks and for better glucose control 2)  Resume mild exercise if approved by MD 3)  Continue to work toward a healthy diet as foods are tolerated 4)  Follow-up in 5 months on Tuesday June 27th @ 10:00 am   Great to see you today!

## 2015-12-27 NOTE — Progress Notes (Signed)
Subjective:  Patient is a 50 yo female with type 2 diabetes who presents today for 3 month follow-up as part of the employer-sponsored Link to [employer] program. Current diabetes regimen includes Humulin U-500 via pump (approximately 60 units per day) and Invokamet 50-1000 mg twice daily.  Patient also continues on daily ASA, ACEi, and statin.  Patient will follow-up with Dr. Forde Dandy for [facility] and diabetes review Jan 2017.  Patient continues to struggle with ankle/foot pain and has been out of work due to physician orders for limited mobility.  She continues to have this monitored closely.  She has also underwent several steroid injections to foot and ankle causing significant glucose fluctuation.  Physicians: Doran Durand (ortho), [place] (PCP and endo), [place] (podiatrist), [place] (renal)   Diabetes Assessment:     Changes to diabetes regimen include minor self adjustment to both basal and bolus dosing.  She is giving an extra unit at meal time regardless of glucose and is giving a correction dose in addition to this.  Patient does maintain good medication compliance.  Most recent A1c was 6.8% (prev 7.5% Oct 2016 with Dr. Forde Dandy.  Weight has remained unchanged since last visit.  Patient did not bring meter today but is currently testing 2-3 times per day on average.  See below for glucose report by patient. No hypoglycemia reported.  Hyperglycemia as a result of steroid injections for ankle/foot.  Highest readings have been >400.  She continues to struggle with neuropathy in feet along with ankle/foot injury.  She is being followed by ortho and podiatry and has been taken out of work with orders for limited mobility.    Running 106-165 before lunch or supper.  Fasting 101-112  Highest 435 after ankle injection No hypoglycemia.  Lifestyle Assessment:  Diet - Patient continues to struggle with esophageal issues making diet less than optimal. She continues to eat mainly soft foods and ground meat such  as ground beef, chicken, or fish.  She cannot tolerate rice and certain breads such as rolls. Patient continues to be very frustrated with dietary/esophageal issues.     Exercise - Pt is on walking restrictions due to multiple issues with left foot/ankle.  Per MD patient is not to stand or walk for any more than 15 minutes every hour.  She is now also using a cane as needed.     Plan and Goals: 1)  Continue testing regularly, especially after steroid injections 2)  Resume mild exercise if approved by MD 3)  Continue to work toward a healthy diet as foods are tolerated 4)  Follow-up in 5 months on Tuesday June 27th @ 10:00 am   Great to see you today!  Tilman Neat, PharmD [facility]  [phone]

## 2015-12-28 DIAGNOSIS — R740 Nonspecific elevation of levels of transaminase and lactic acid dehydrogenase [LDH]: Secondary | ICD-10-CM | POA: Diagnosis not present

## 2015-12-28 DIAGNOSIS — E11621 Type 2 diabetes mellitus with foot ulcer: Secondary | ICD-10-CM | POA: Diagnosis not present

## 2015-12-28 DIAGNOSIS — S93492A Sprain of other ligament of left ankle, initial encounter: Secondary | ICD-10-CM | POA: Diagnosis not present

## 2015-12-28 DIAGNOSIS — E1129 Type 2 diabetes mellitus with other diabetic kidney complication: Secondary | ICD-10-CM | POA: Diagnosis not present

## 2015-12-28 DIAGNOSIS — R269 Unspecified abnormalities of gait and mobility: Secondary | ICD-10-CM | POA: Diagnosis not present

## 2015-12-28 DIAGNOSIS — F5104 Psychophysiologic insomnia: Secondary | ICD-10-CM | POA: Diagnosis not present

## 2015-12-28 DIAGNOSIS — E890 Postprocedural hypothyroidism: Secondary | ICD-10-CM | POA: Diagnosis not present

## 2015-12-28 DIAGNOSIS — C73 Malignant neoplasm of thyroid gland: Secondary | ICD-10-CM | POA: Diagnosis not present

## 2015-12-28 DIAGNOSIS — Z6841 Body Mass Index (BMI) 40.0 and over, adult: Secondary | ICD-10-CM | POA: Diagnosis not present

## 2015-12-30 ENCOUNTER — Ambulatory Visit: Payer: 59 | Admitting: Podiatry

## 2016-01-02 ENCOUNTER — Ambulatory Visit (INDEPENDENT_AMBULATORY_CARE_PROVIDER_SITE_OTHER): Payer: 59 | Admitting: Podiatry

## 2016-01-02 ENCOUNTER — Encounter: Payer: Self-pay | Admitting: Podiatry

## 2016-01-02 VITALS — BP 131/86 | HR 92 | Resp 18

## 2016-01-02 DIAGNOSIS — T148 Other injury of unspecified body region: Secondary | ICD-10-CM | POA: Diagnosis not present

## 2016-01-02 DIAGNOSIS — L97521 Non-pressure chronic ulcer of other part of left foot limited to breakdown of skin: Secondary | ICD-10-CM

## 2016-01-02 DIAGNOSIS — T148XXA Other injury of unspecified body region, initial encounter: Secondary | ICD-10-CM

## 2016-01-02 DIAGNOSIS — E1140 Type 2 diabetes mellitus with diabetic neuropathy, unspecified: Secondary | ICD-10-CM

## 2016-01-02 NOTE — Progress Notes (Signed)
Patient ID: [name], female   DOB: [birth year], 50 y.o.   MRN: [id number]  Subjective: 50 year old female presents the office they for follow-up evaluation of ulceration left big toe. She states that her primary care physician to trim some callus over the area. She denies any pus or drainage denies any redness or red streaks.  She is currently seeing orthopedics for her left ankle. She states the pain is 1 to the outside aspect of the left ankle. She's had steroid injections was has not helped. She is going to [facility] for second opinion.  She denies any systemic complaints such as fevers, chills, nausea, vomiting. No calf pain, chest pain, shortness of breath.  Objective: AAO 3, NAD DP/PT pulses palpable, CRT less than 3 seconds Protective sensation decreased with Simms Weinstein monofilament On the plantar aspect of left hallux is a hyperkeratotic lesion. Upon debridement there is a small superficial granular wound present which appears to be more of an abrasion type wound. There is no probing, undermining, tunneling. There is no edema, erythema, increased warmth of the hallux. There is no other open lesions or pre-ulcer lesions identified bilaterally. There is no conical signs of infection at this time. The area on the ankle from with the brace is rubbing has healed there is no other open lesions identified at this time. There does continue be tenderness along the lateral aspect of the ankle course the ATFL mostly. Today there is also tenderness along the course the peroneal tendons posterior to lateral malleolus. Peroneal tendons appear to be intact. There is discomfort with subtotal joint range of motion. No other areas of tenderness to bilateral lower extremities. No pain with calf compression swelling, warmth, erythema.  Assessment: 50 year old female left healing plantar hallux ulceration; lateral left ankle pain  Plan:  -Treatment options discussed including all  alternatives, risks, and complications -Hyperkeratotic lesions/wound left hallux debridement without complication. Continued antibiotic ointment over on the area daily and a dressing. Offloading pads. Monitor for any clinical signs or symptoms of infection and directed to call the office immediately should any occur or go to the ER. -At this time she is attended multiple conservative treatments her left ankle including physical therapy, injections, bracing without any relief of symptoms. She has one to [facility] for second opinion. I also discussed surgery with her as well. She'll call me after her evaluation with [facility]. In the meantime I encouraged her to call the office with any questions, concerns or any changes symptoms. -Follow up in 4 weeks for the wound.  Celesta Gentile, DPM

## 2016-01-06 DIAGNOSIS — E108 Type 1 diabetes mellitus with unspecified complications: Secondary | ICD-10-CM | POA: Diagnosis not present

## 2016-01-12 DIAGNOSIS — M25572 Pain in left ankle and joints of left foot: Secondary | ICD-10-CM | POA: Diagnosis not present

## 2016-01-12 DIAGNOSIS — M7732 Calcaneal spur, left foot: Secondary | ICD-10-CM | POA: Diagnosis not present

## 2016-01-12 DIAGNOSIS — M79672 Pain in left foot: Secondary | ICD-10-CM | POA: Diagnosis not present

## 2016-01-12 DIAGNOSIS — G8929 Other chronic pain: Secondary | ICD-10-CM | POA: Diagnosis not present

## 2016-01-12 DIAGNOSIS — M25372 Other instability, left ankle: Secondary | ICD-10-CM | POA: Diagnosis not present

## 2016-01-12 DIAGNOSIS — M19072 Primary osteoarthritis, left ankle and foot: Secondary | ICD-10-CM | POA: Diagnosis not present

## 2016-01-13 NOTE — Progress Notes (Signed)
I have reviewed this pharmacist's note and agree  

## 2016-01-30 ENCOUNTER — Telehealth: Payer: Self-pay | Admitting: *Deleted

## 2016-01-30 ENCOUNTER — Encounter: Payer: Self-pay | Admitting: Podiatry

## 2016-01-30 ENCOUNTER — Ambulatory Visit (INDEPENDENT_AMBULATORY_CARE_PROVIDER_SITE_OTHER): Payer: 59 | Admitting: Podiatry

## 2016-01-30 DIAGNOSIS — E1140 Type 2 diabetes mellitus with diabetic neuropathy, unspecified: Secondary | ICD-10-CM

## 2016-01-30 DIAGNOSIS — S61209A Unspecified open wound of unspecified finger without damage to nail, initial encounter: Secondary | ICD-10-CM

## 2016-01-30 DIAGNOSIS — T148XXA Other injury of unspecified body region, initial encounter: Secondary | ICD-10-CM

## 2016-01-30 DIAGNOSIS — T148 Other injury of unspecified body region: Secondary | ICD-10-CM | POA: Diagnosis not present

## 2016-01-30 DIAGNOSIS — IMO0002 Reserved for concepts with insufficient information to code with codable children: Secondary | ICD-10-CM

## 2016-01-30 DIAGNOSIS — L97521 Non-pressure chronic ulcer of other part of left foot limited to breakdown of skin: Secondary | ICD-10-CM | POA: Diagnosis not present

## 2016-01-30 DIAGNOSIS — R0989 Other specified symptoms and signs involving the circulatory and respiratory systems: Secondary | ICD-10-CM

## 2016-01-30 NOTE — Progress Notes (Signed)
Patient ID: [name], female   DOB: 05/24/1966, 50 y.o.   MRN: [id number]  Subjective: 50 year old female presents the office they for follow-up evaluation of ulceration left big toe. She continues to change the pain is daily and she has not is any drainage or pus coming from the area and denies any redness. She also says that her right big toenail did fall off without any injury. There is a wound where the nail used to be denies any drainage or surrounding redness or pus.  Since last appointment she is also followed up with Dr. Nicki Reaper from [facility] in regards to her ankle. He was apparently concerned about CRPS and referred her to pain management for evaluation. She called her insurance and she needs to be referred in network. She does get pain most to the also asked the left ankle. She is more an ankle brace. She has pain on daily basis after she walks or stands for a period time.  She denies any systemic complaints such as fevers, chills, nausea, vomiting. No calf pain, chest pain, shortness of breath.  Objective: AAO 3, NAD DP/PT pulses palpable on the PT somewhat decreased, CRT less than 3 seconds Protective sensation decreased with Simms Weinstein monofilament On the plantar aspect of left hallux is a hyperkeratotic lesion. Upon debridement there is a small superficial granular wound present which appears to be more of an abrasion type wound. The wound appears be somewhat smaller compared to last appointment. There is no probing, undermining, tunneling. There is no edema, erythema, increased warmth of the hallux.  On the right hallux nailbed is a granular wound from the nailbed is been removed. There is no probing, undermining tunneling. No edema, erythema, drainage or pus. There does continue be tenderness along the lateral aspect of the ankle course the ATFL mostly. Today there is also tenderness along the course the peroneal tendons posterior to lateral malleolus. Peroneal tendons  appear to be intact. There is discomfort with subtotal joint range of motion. No other areas of tenderness to bilateral lower extremities. No pain with calf compression swelling, warmth, erythema.  Assessment: 50 year old female left healing plantar hallux ulceration; lateral left ankle pain from ATFL tear  Plan:  -Treatment options discussed including all alternatives, risks, and complications -Hyperkeratotic lesions/wound left hallux debridement without complication. Continued antibiotic ointment over on the area daily and a dressing. Offloading pads. Monitor for any clinical signs or symptoms of infection and directed to call the office immediately should any occur or go to the ER. -I discussed with her surgical intervention for left lateral ankle pain. Discussed the ATFL repair and possible peroneal tendon exploration and repair. She is attended multiple conservative treatments for any resolution of symptoms likely that surgery is a viable option. I discussed that this is not a guarantee resolution of symptoms. Given her medical condition she is at increased risk of complications and she is aware of this. In preparation for the surgery I would like her to get vascular studies to ensure postoperative healing as well as pain management referral to rule out CRPS although I do not think that she has clinical symptoms at this time. However orthopedics were concerned about this.  -She is appointment with her primary care physician March 1. Recommended for her to get medical clearance that time and to have copies of the blood work sent to me as well. -Follow up in 2 weeks or sooner if needed.  Celesta Gentile, DPM

## 2016-01-30 NOTE — Telephone Encounter (Addendum)
-----   Message from Trula Slade, DPM sent at 01/30/2016  8:38 AM EST ----- Can you order arterial studies for decreased pulse and also pain management consult to rule out CRPS. She was seen by ortho at [facility] and they ordered pain management but it was out of network.   Arterial dopplers orders faxed to [facility]  Pain management orders, required form, clinicals and demographics faxed to [facility] [phone].

## 2016-02-03 ENCOUNTER — Telehealth: Payer: Self-pay | Admitting: *Deleted

## 2016-02-03 ENCOUNTER — Other Ambulatory Visit: Payer: Self-pay | Admitting: Podiatry

## 2016-02-03 DIAGNOSIS — R0989 Other specified symptoms and signs involving the circulatory and respiratory systems: Secondary | ICD-10-CM

## 2016-02-03 NOTE — Telephone Encounter (Signed)
Pt states she has received an appt for the circulation test but no one has called about the Pain Mgmt.  I told pt Pain Mgmt would review her records and call her in about 7-14 days.  Pt states understanding.

## 2016-02-07 DIAGNOSIS — N182 Chronic kidney disease, stage 2 (mild): Secondary | ICD-10-CM | POA: Diagnosis not present

## 2016-02-07 DIAGNOSIS — N181 Chronic kidney disease, stage 1: Secondary | ICD-10-CM | POA: Diagnosis not present

## 2016-02-07 DIAGNOSIS — R809 Proteinuria, unspecified: Secondary | ICD-10-CM | POA: Diagnosis not present

## 2016-02-08 ENCOUNTER — Ambulatory Visit (HOSPITAL_COMMUNITY)
Admission: RE | Admit: 2016-02-08 | Discharge: 2016-02-08 | Disposition: A | Discharge status: Home / Self Care | Payer: 59 | Source: Ambulatory Visit | Attending: Cardiology | Admitting: Cardiology

## 2016-02-08 ENCOUNTER — Telehealth: Payer: Self-pay | Admitting: *Deleted

## 2016-02-08 DIAGNOSIS — I10 Essential (primary) hypertension: Secondary | ICD-10-CM | POA: Diagnosis not present

## 2016-02-08 DIAGNOSIS — D126 Benign neoplasm of colon, unspecified: Secondary | ICD-10-CM | POA: Diagnosis not present

## 2016-02-08 DIAGNOSIS — E119 Type 2 diabetes mellitus without complications: Secondary | ICD-10-CM | POA: Insufficient documentation

## 2016-02-08 DIAGNOSIS — R740 Nonspecific elevation of levels of transaminase and lactic acid dehydrogenase [LDH]: Secondary | ICD-10-CM | POA: Diagnosis not present

## 2016-02-08 DIAGNOSIS — E890 Postprocedural hypothyroidism: Secondary | ICD-10-CM | POA: Diagnosis not present

## 2016-02-08 DIAGNOSIS — E559 Vitamin D deficiency, unspecified: Secondary | ICD-10-CM | POA: Diagnosis not present

## 2016-02-08 DIAGNOSIS — R0989 Other specified symptoms and signs involving the circulatory and respiratory systems: Secondary | ICD-10-CM | POA: Insufficient documentation

## 2016-02-08 DIAGNOSIS — E7800 Pure hypercholesterolemia, unspecified: Secondary | ICD-10-CM | POA: Insufficient documentation

## 2016-02-08 DIAGNOSIS — S93492A Sprain of other ligament of left ankle, initial encounter: Secondary | ICD-10-CM | POA: Diagnosis not present

## 2016-02-08 DIAGNOSIS — D509 Iron deficiency anemia, unspecified: Secondary | ICD-10-CM | POA: Diagnosis not present

## 2016-02-08 DIAGNOSIS — C73 Malignant neoplasm of thyroid gland: Secondary | ICD-10-CM | POA: Diagnosis not present

## 2016-02-08 DIAGNOSIS — E1129 Type 2 diabetes mellitus with other diabetic kidney complication: Secondary | ICD-10-CM | POA: Diagnosis not present

## 2016-02-08 DIAGNOSIS — F329 Major depressive disorder, single episode, unspecified: Secondary | ICD-10-CM | POA: Diagnosis not present

## 2016-02-08 NOTE — Telephone Encounter (Addendum)
-----   Message from Trula Slade, DPM sent at 02/08/2016 11:21 AM EST ----- Please let her know that her vascular studies were normal. 02/08/2016-DR. WAGONER'S REVIEW OF ARTERIAL DOPPLER states are within normal limits. Pt request results to be sent to Dr. Reynold Bowen.  Faxed 02/08/2016 dopplers to Dr. Forde Dandy.

## 2016-02-20 ENCOUNTER — Encounter: Payer: Self-pay | Admitting: Podiatry

## 2016-02-20 ENCOUNTER — Ambulatory Visit (INDEPENDENT_AMBULATORY_CARE_PROVIDER_SITE_OTHER): Payer: 59 | Admitting: Podiatry

## 2016-02-20 DIAGNOSIS — L97521 Non-pressure chronic ulcer of other part of left foot limited to breakdown of skin: Secondary | ICD-10-CM | POA: Diagnosis not present

## 2016-02-20 DIAGNOSIS — E1140 Type 2 diabetes mellitus with diabetic neuropathy, unspecified: Secondary | ICD-10-CM

## 2016-02-20 DIAGNOSIS — T148XXA Other injury of unspecified body region, initial encounter: Secondary | ICD-10-CM

## 2016-02-20 DIAGNOSIS — T148 Other injury of unspecified body region: Secondary | ICD-10-CM | POA: Diagnosis not present

## 2016-02-20 NOTE — Progress Notes (Signed)
Patient ID: [name], female   DOB: 1966/11/25, 50 y.o.   MRN: [id number]  Subjective: 50 year old female presents the office they for follow-up evaluation of ulceration left big toe. Overall she feels is improving some but she still has a wound to the area. She's been applying Silvadene dressing  To the wound daily. Denies any drainage or pus recently redness or red streaks. No malodor.  She did have vascular studies performed his last appointment.  She has an upcoming appointment with Dr. Doran Durand in regards to left ankle surgery. She is also awaiting response with pain management at [facility] regards to appointment to well CRPS which is a concern from Dr. Nicki Reaper at [facility].  She denies any systemic complaints such as fevers, chills, nausea, vomiting. No calf pain, chest pain, shortness of breath.  Objective: AAO 3, NAD DP/PT pulses palpable on the PT somewhat decreased, CRT less than 3 seconds Protective sensation decreased with Simms Weinstein monofilament On the plantar aspect of left hallux is a hyperkeratotic lesion. Upon debridement there is a small superficial granular wound present which appears to be more of an abrasion type wound. The wound appears be somewhat larger compared to last appointment measuring 0.5 x 0.5 cm today. There is no probing, undermining, tunneling. There is no edema, erythema, increased warmth of the hallux.  There does continue be tenderness along the lateral aspect of the ankle course the ATFL mostly. Today there is also tenderness along the course the peroneal tendons posterior to lateral malleolus. Peroneal tendons appear to be intact. There is discomfort with subtotal joint range of motion. No other areas of tenderness to bilateral lower extremities. No pain with calf compression swelling, warmth, erythema.  Assessment: 50 year old female left healing plantar hallux ulceration; lateral left ankle pain from ATFL tear  Plan:  -Treatment options  discussed including all alternatives, risks, and complications -Hyperkeratotic lesions/wound left hallux debridement without complication. Continued antibiotic ointment over on the area daily and a dressing. Offloading pads. Monitor for any clinical signs or symptoms of infection and directed to call the office immediately should any occur or go to the ER. -I discussed with her surgical intervention for left lateral ankle pain. She has an upcoming appointment with Dr. Doran Durand at the end of the month. Pain management referral pending. Continue brace and exercises for now.  -Follow-up as scheduled or sooner if any problems arise. In the meantime, encouraged to call the office with any questions, concerns, change in symptoms.   Celesta Gentile, DPM

## 2016-02-23 ENCOUNTER — Encounter: Payer: 59 | Attending: Physical Medicine & Rehabilitation | Admitting: Physical Medicine & Rehabilitation

## 2016-02-23 ENCOUNTER — Encounter: Payer: Self-pay | Admitting: Physical Medicine & Rehabilitation

## 2016-02-23 VITALS — BP 125/76 | HR 104 | Resp 14

## 2016-02-23 DIAGNOSIS — F419 Anxiety disorder, unspecified: Secondary | ICD-10-CM | POA: Diagnosis not present

## 2016-02-23 DIAGNOSIS — M25572 Pain in left ankle and joints of left foot: Secondary | ICD-10-CM | POA: Diagnosis not present

## 2016-02-23 DIAGNOSIS — M797 Fibromyalgia: Secondary | ICD-10-CM | POA: Diagnosis not present

## 2016-02-23 DIAGNOSIS — G894 Chronic pain syndrome: Secondary | ICD-10-CM | POA: Diagnosis not present

## 2016-02-23 DIAGNOSIS — Z5181 Encounter for therapeutic drug level monitoring: Secondary | ICD-10-CM | POA: Diagnosis not present

## 2016-02-23 DIAGNOSIS — M109 Gout, unspecified: Secondary | ICD-10-CM | POA: Insufficient documentation

## 2016-02-23 DIAGNOSIS — D509 Iron deficiency anemia, unspecified: Secondary | ICD-10-CM | POA: Insufficient documentation

## 2016-02-23 DIAGNOSIS — Z7982 Long term (current) use of aspirin: Secondary | ICD-10-CM | POA: Insufficient documentation

## 2016-02-23 DIAGNOSIS — R269 Unspecified abnormalities of gait and mobility: Secondary | ICD-10-CM | POA: Insufficient documentation

## 2016-02-23 DIAGNOSIS — E039 Hypothyroidism, unspecified: Secondary | ICD-10-CM | POA: Diagnosis not present

## 2016-02-23 DIAGNOSIS — E119 Type 2 diabetes mellitus without complications: Secondary | ICD-10-CM | POA: Insufficient documentation

## 2016-02-23 DIAGNOSIS — R079 Chest pain, unspecified: Secondary | ICD-10-CM | POA: Diagnosis not present

## 2016-02-23 DIAGNOSIS — K219 Gastro-esophageal reflux disease without esophagitis: Secondary | ICD-10-CM | POA: Diagnosis not present

## 2016-02-23 DIAGNOSIS — R531 Weakness: Secondary | ICD-10-CM | POA: Insufficient documentation

## 2016-02-23 DIAGNOSIS — Z87442 Personal history of urinary calculi: Secondary | ICD-10-CM | POA: Insufficient documentation

## 2016-02-23 DIAGNOSIS — I10 Essential (primary) hypertension: Secondary | ICD-10-CM | POA: Insufficient documentation

## 2016-02-23 DIAGNOSIS — G43909 Migraine, unspecified, not intractable, without status migrainosus: Secondary | ICD-10-CM | POA: Insufficient documentation

## 2016-02-23 DIAGNOSIS — E7800 Pure hypercholesterolemia, unspecified: Secondary | ICD-10-CM | POA: Insufficient documentation

## 2016-02-23 DIAGNOSIS — R252 Cramp and spasm: Secondary | ICD-10-CM | POA: Insufficient documentation

## 2016-02-23 DIAGNOSIS — Z79899 Other long term (current) drug therapy: Secondary | ICD-10-CM | POA: Diagnosis not present

## 2016-02-23 NOTE — Progress Notes (Addendum)
Subjective:    Patient ID: [name], female    DOB: 1966-01-31, 50 y.o.   MRN: [id number]  HPI  50 y/o female with pmh of DM, migraines, hypothyroidism, fibromyalgia and psh of back surgery ~2012 presents with left ankle pain.  Started ~05/2015.  She denies inciting events. Pressure relief improves the pain.  Standing on her leg increases the pain.  Sharp, throbbing pain.  She denies radiation.  It constant.  Severity 9/10 today.  She has associated weakness.  She denies skin changes, color changes, hair loss.  No hypersensitivity/allodynia. No changes in tempuratures in b/l feet.  No burning/tingling sensation. The pain is localized. No changes in skin texture. No sweating. She does have loss of her 1st toe nail, however, this happened before her current symptoms.  She went to see a podiatrist, who diagnosed with 2 fractures in her foot who placed her in a boot. She later saw Ortho who put pt in a cast.  She was taken out of the cast and rolled her ankle.  She was later put in a brace.  She received injections in her foot with improved the pain.  She had PT.  She later had an ankle injection x 2 (which only helped for a day).  She received a second opinion at [facility], who did not believe it was an Orthopedic issue.  Her Ortho physician wanted to rule out CRPS.  She is scheduled to see Ortho for potential surgery.   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    Pain Inventory Average Pain 9 Pain Right Now 9 My pain is constant, sharp, stabbing and aching  In the last 24 hours, has pain interfered with the following? General activity 10 Relation with others 9 Enjoyment of life 10 What TIME of day is your pain at its worst? all Sleep (in general) Poor  Pain is worse with: walking, bending, standing and some activites Pain improves with: rest and medication Relief from Meds: 5  Mobility walk with assistance use a cane how many minutes can you walk? 15-30 ability to climb  steps?  yes do you drive?  yes transfers alone Do you have any goals in this area?  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 Forde Dandy Orthopedist Dr. Doran Durand podiatrist- Dr. Earleen Newport  History reviewed. No pertinent family history. Social History   Social History  . Marital Status: Married    Spouse Name: N/A  . Number of Children: N/A  . Years of Education: N/A   Social History Main Topics  . Smoking status: Never Smoker   . Smokeless tobacco: Never Used  . Alcohol Use: No  . Drug Use: No  . Sexual Activity: Yes   Other Topics Concern  . None   Social History Narrative   Past Surgical History  Procedure Laterality Date  . Lumbar disc surgery  08/2011  . Colonoscopy    . Esophagogastroduodenoscopy    . Total thyroidectomy  02/18/2015  . Back surgery    . Cystoscopy with ureteroscopy, stone basketry and stent placement  ~ 2006  . Abdominal hysterectomy  ~2012    "lap"  . Cardiac catheterization  04/2009    "clean"  . Laparoscopic cholecystectomy  05/2004  .  Thyroidectomy N/A 02/18/2015    Procedure: TOTAL THYROIDECTOMY;  Surgeon: [name], MD;  Location: [place];  Service: General;  Laterality: N/A;   Past Medical History  Diagnosis Date  . Hypertension   . GERD (gastroesophageal reflux disease)   . Fibromyalgia   . Proteinuria   . PONV (postoperative nausea and vomiting)   . Anginal pain ([place])     chest pain  heart cath done  . History of hiatal hernia   . Kidney stones   . High cholesterol   . Chronic bronchitis ([place])     "get it q yr"  . Hypothyroidism   . Type II diabetes mellitus ([place])   . Iron deficiency anemia     "comes and goes"  . Migraine     "@ least once/month" (02/18/2015)  . History of  gout   . Cancer (HCC)    BP 125/76 mmHg  Pulse 104  Resp 14  SpO2 96%  Opioid Risk Score:   Fall Risk Score:  `1  Depression screen PHQ 2/9  Depression screen PHQ 2/9 02/23/2016  Decreased Interest 2  Down, Depressed, Hopeless 0  PHQ - 2 Score 2  Altered sleeping 3  Tired, decreased energy 3  Change in appetite 1  Feeling bad or failure about yourself  2  Trouble concentrating 0  Moving slowly or fidgety/restless 3  Suicidal thoughts 0  PHQ-9 Score 14  Difficult doing work/chores Extremely dIfficult   Current Outpatient Prescriptions on File Prior to Visit  Medication Sig Dispense Refill  . acetaminophen (TYLENOL) 325 MG tablet Take 2 tablets (650 mg total) by mouth every 4 (four) hours as needed for mild pain, fever or headache.    . ALPRAZolam (XANAX) 0.5 MG tablet Take 0.5 mg by mouth at bedtime as needed. For anxiety    . aspirin EC 81 MG tablet Take 81 mg by mouth daily.    Marland Kitchen atorvastatin (LIPITOR) 20 MG tablet Take 20 mg by mouth daily.    Marland Kitchen BAYER CONTOUR NEXT TEST test strip   4  . cetirizine (ZYRTEC) 10 MG tablet Take 10 mg by mouth at bedtime. Reported on 12/27/2015    . cyclobenzaprine (FLEXERIL) 10 MG tablet Take 10 mg by mouth at bedtime.    . D3-50 50000 UNITS capsule Take 50,000 Units by mouth once a week. Saturday  11  . dexlansoprazole (DEXILANT) 60 MG capsule Take 60 mg by mouth daily.    Marland Kitchen dicyclomine (BENTYL) 10 MG capsule Take 10 mg by mouth 2 (two) times daily as needed for spasms (For IBS). Reported on 12/27/2015    . fluticasone (FLONASE) 50 MCG/ACT nasal spray Place 1 spray into the nose daily. Reported on 12/27/2015    . furosemide (LASIX) 40 MG tablet Take 40 mg by mouth 2 (two) times daily.      Marland Kitchen gabapentin (NEURONTIN) 300 MG capsule TAKE 1 CAPSULE (300 MG TOTAL) BY MOUTH AT BEDTIME. 30 capsule 3  . HYDROcodone-acetaminophen (NORCO) 7.5-325 MG per tablet Take 1 tablet by mouth every 6 (six) hours as needed for moderate pain.    Marland Kitchen ibuprofen  (ADVIL,MOTRIN) 200 MG tablet Take 600-800 mg by mouth every 8 (eight) hours as needed for moderate pain.    Marland Kitchen insulin regular human CONCENTRATED (HUMULIN R) 500 UNIT/ML SOLN injection Inject 2 Units into the skin. 2.0 units /hour 0500-2359, 1.5units [id number]     . INVOKAMET 50-1000 MG TABS Take 1 tablet by mouth 2 (two) times daily.  5  . levothyroxine (SYNTHROID, LEVOTHROID) 125 MCG tablet Take 250 mcg by mouth daily before breakfast. Take 2 tablets daily    . lisinopril (PRINIVIL,ZESTRIL) 40 MG tablet Take 40 mg by mouth 2 (two) times daily.     . metoprolol (TOPROL-XL) 50 MG 24 hr tablet Take 50-100 mg by mouth 2 (two) times daily. Takes 50 mg in the morning and 100 mg in the evening    . Multiple Vitamins-Minerals (MULTIVITAMIN PO) Take 2 tablets by mouth daily. gummies    . ondansetron (ZOFRAN) 4 MG tablet Take 1 tablet (4 mg total) by mouth every 6 (six) hours. 12 tablet 0  . ondansetron (ZOFRAN) 4 MG tablet Take 1 tablet (4 mg total) by mouth every 8 (eight) hours as needed for nausea. 20 tablet 0   No current facility-administered medications on file prior to visit.   Review of Systems  Constitutional: Positive for appetite change and unexpected weight change.  Cardiovascular: Positive for leg swelling.  Skin: Positive for rash.  Hematological: Bruises/bleeds easily.  All other systems reviewed and are negative.     Objective:   Physical Exam HENT: Normocephalic, Atraumatic Eyes: EOMI, Conj WNL Cardio: S1, S2 normal, RRR Pulm: B/l clear to auscultation.  Effort normal Abd: Soft, non-distended, non-tender, BS+ MSK:  Gait antalgic.   TTP left ankle, greatest on lateral side.    Mild edema left foot.   Pain with rotation of left foot Neuro: CN II-XII grossly intact.    Sensation intact to light touch in all LE dermatomes  Reflexes 2+ throughout  Strength  5/5 in all RLE myotomes    4/5 in all LLE hip flexion, knee extension. 2/5 dorsiflexion  Skin:  No atrophic changes in  b/l feet.    Ulcer of 1st digit of LLE.    Loss of 1st toe nail of LLE (recently manipulated by podiatry)    Assessment & Plan:  50 y/o female with pmh of DM, migraines, hypothyroidism, fibromyalgia and psh of back surgery ~2012 presents with left ankle pain to rule out CRPS.    1. 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  She also had ABI, which were normal, per pt.    She cannot do pool therapy at present due to her toe ulcer  Cont meds per PCP, Podiatry, Ortho  This does not appear to be CRPS: She denies skin changes, color changes, hair loss.  No hypersensitivity/allodynia. No changes in tempuratures in b/l feet.  No burning/tingling sensation. The pain is localized. No changes in skin texture. No sweating. She does have loss of her 1st toe nail, however, this happened before her current symptoms. No MRI mention of bone resorption.  2. Abnormality of gait  Cont cane for safety  3. Chronic pain syndrome  Cont meds per physicians  Pt to have ?surgery  RTC: PRN

## 2016-02-29 DIAGNOSIS — M25572 Pain in left ankle and joints of left foot: Secondary | ICD-10-CM | POA: Diagnosis not present

## 2016-02-29 DIAGNOSIS — M25372 Other instability, left ankle: Secondary | ICD-10-CM | POA: Diagnosis not present

## 2016-02-29 LAB — TOXASSURE SELECT,+ANTIDEPR,UR: PDF: 0

## 2016-03-01 NOTE — Progress Notes (Signed)
Urine drug screen for this encounter is consistent for prescribed medication.  Alprazolam reported but not detected, though last taken day before test and so may be considered consistent.

## 2016-03-12 ENCOUNTER — Ambulatory Visit (INDEPENDENT_AMBULATORY_CARE_PROVIDER_SITE_OTHER): Payer: 59 | Admitting: Podiatry

## 2016-03-12 ENCOUNTER — Encounter: Payer: Self-pay | Admitting: Podiatry

## 2016-03-12 ENCOUNTER — Ambulatory Visit (INDEPENDENT_AMBULATORY_CARE_PROVIDER_SITE_OTHER): Payer: 59

## 2016-03-12 VITALS — BP 106/76 | HR 90 | Resp 18

## 2016-03-12 DIAGNOSIS — R52 Pain, unspecified: Secondary | ICD-10-CM | POA: Diagnosis not present

## 2016-03-12 DIAGNOSIS — M545 Low back pain: Secondary | ICD-10-CM | POA: Diagnosis not present

## 2016-03-12 DIAGNOSIS — L97521 Non-pressure chronic ulcer of other part of left foot limited to breakdown of skin: Secondary | ICD-10-CM

## 2016-03-12 DIAGNOSIS — L03032 Cellulitis of left toe: Secondary | ICD-10-CM

## 2016-03-12 DIAGNOSIS — Z6841 Body Mass Index (BMI) 40.0 and over, adult: Secondary | ICD-10-CM | POA: Diagnosis not present

## 2016-03-12 DIAGNOSIS — L89891 Pressure ulcer of other site, stage 1: Secondary | ICD-10-CM

## 2016-03-12 MED ORDER — DOXYCYCLINE HYCLATE 100 MG PO TABS: 100.0000 mg | ORAL_TABLET | Freq: Two times a day (BID) | ORAL | Status: DC

## 2016-03-15 DIAGNOSIS — L03032 Cellulitis of left toe: Secondary | ICD-10-CM | POA: Insufficient documentation

## 2016-03-15 DIAGNOSIS — L97509 Non-pressure chronic ulcer of other part of unspecified foot with unspecified severity: Secondary | ICD-10-CM | POA: Insufficient documentation

## 2016-03-15 NOTE — Progress Notes (Signed)
Patient ID: [name], female   DOB: 20-Oct-1966, 50 y.o.   MRN: [id number]   Subjective: 50 year old female presents the office they for follow-up evaluation of ulceration left big toe. He feels this worsened somewhat since last appointment. Her left third toenail did follow-up since last appointment she feels that the second one is about a fall off. She has noticed some redness around the big toe and the second toe. Denies any drainage or pus from the wound around the toenails. Denies any red streaks.  She did have vascular studies performed his last appointment.  She has an upcoming appointment with Dr. Doran Durand in regards to left ankle surgery. She did follow-up with pain management has no signs of CRPS.  She denies any systemic complaints such as fevers, chills, nausea, vomiting. No calf pain, chest pain, shortness of breath.  Objective: AAO 3, NAD DP/PT pulses palpable on the PT somewhat decreased, CRT less than 3 seconds Protective sensation decreased with Simms Weinstein monofilament Ulceration present on the plantar aspect left hallux that measures 1 x 1 x 0.5 cm with a granular wound base. It does not probe to bone there is no undermining or tunneling. There is a faint amount of erythema around the end of the toe without any ascending synovitis. There is no fluctuance or crepitus. No malodor. No drainage or pus. Small amount of erythema around the left second toenail the toenail is loose. There is no ascending cellulitis. There is no drainage or pus. No significant incurvation at this time. Toenails left third toe has fallen off. There is no redness or swelling. No drainage. No signs of infection. No other open lesions or pre-ulcerative lesions identified at this time. There does continue be tenderness along the lateral aspect of the ankle course the ATFL mostly. Today there is also tenderness along the course the peroneal tendons posterior to lateral malleolus. Peroneal  tendons appear to be intact. There is discomfort with subtotal joint range of motion. No other areas of tenderness to bilateral lower extremities. No pain with calf compression, swelling, warmth, erythema.  Assessment: 50 year old female left healing plantar hallux ulceration, toe cellulitis,  lateral left ankle pain from ATFL tear  Plan:  -Treatment options discussed including all alternatives, risks, and complications -Wound debrided left hallux without complications to healthy, granular, bleeding wound base. Also the second digit toenail is debrided. There is no drainage or pus. -Will start doxycycline due to cellulitis. -Follow-up with Dr. Doran Durand for left ankle -Monitor for any clinical signs or symptoms of infection and directed to call the office immediately should any occur or go to the ER. -Follow-up as scheduled or sooner if any problems arise. In the meantime, encouraged to call the office with any questions, concerns, change in symptoms.   Celesta Gentile, DPM

## 2016-03-16 ENCOUNTER — Other Ambulatory Visit: Payer: Self-pay | Admitting: Orthopedic Surgery

## 2016-03-18 IMAGING — CR DG CHEST 2V
2 series · 2 of 2 positions shown · non-contrast
Comparison: 11/25/2012

CLINICAL DATA: Preoperative assessment for thyroidectomy, history
hypertension, type 2 diabetes, GERD, hiatal hernia

EXAM:
CHEST  2 VIEW

[w chest pa]
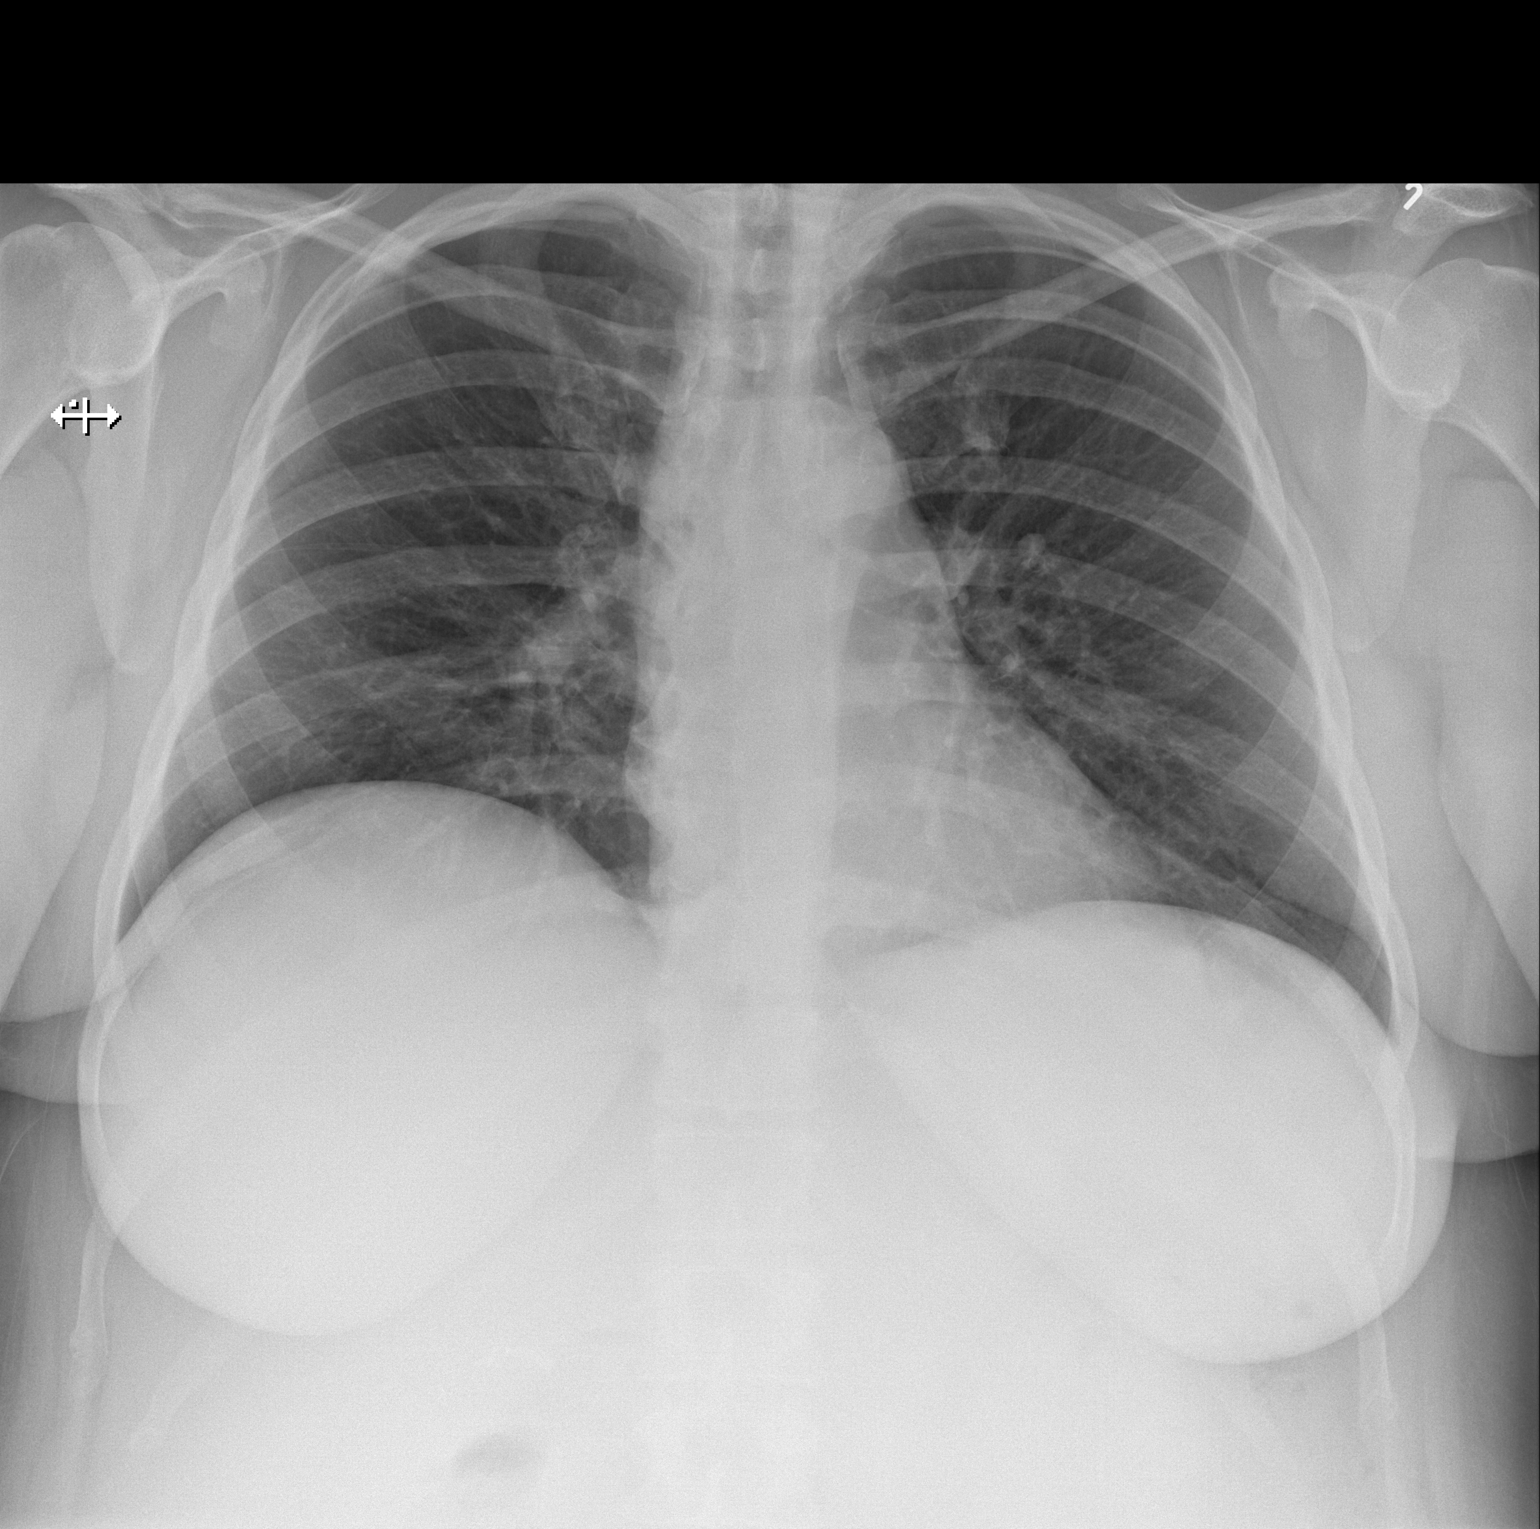

[w chest lat]
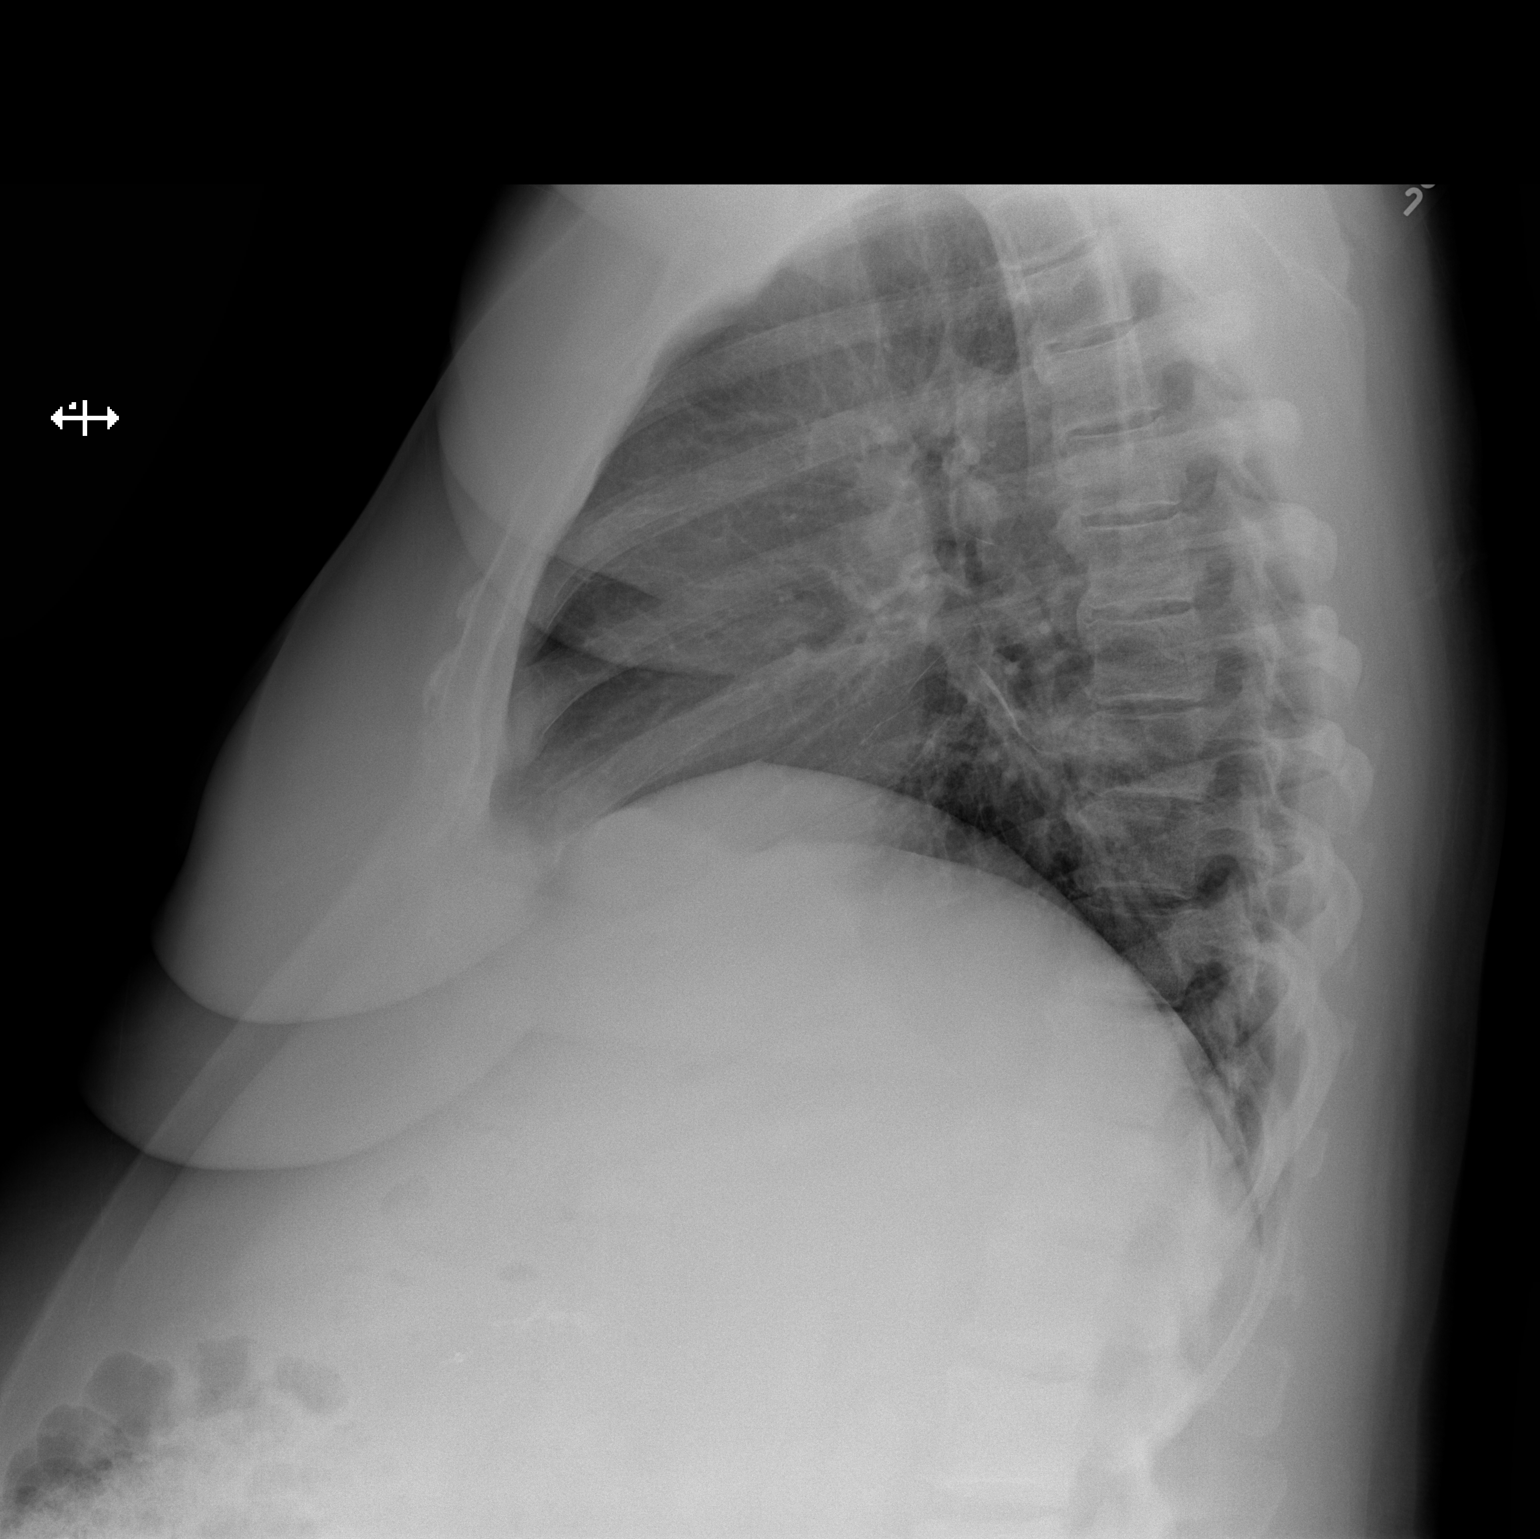

[2 of 2 positions shown; findings below may reference images not displayed]

FINDINGS: Normal heart size, mediastinal contours, and pulmonary vascularity.

Lungs clear.

No pleural effusion or pneumothorax.

Bones unremarkable.
IMPRESSION: No acute abnormalities.

## 2016-03-19 DIAGNOSIS — E108 Type 1 diabetes mellitus with unspecified complications: Secondary | ICD-10-CM | POA: Diagnosis not present

## 2016-03-26 ENCOUNTER — Ambulatory Visit: Payer: 59 | Admitting: Podiatry

## 2016-03-26 ENCOUNTER — Other Ambulatory Visit: Payer: Self-pay

## 2016-03-26 ENCOUNTER — Encounter (HOSPITAL_BASED_OUTPATIENT_CLINIC_OR_DEPARTMENT_OTHER)
Admission: RE | Admit: 2016-03-26 | Discharge: 2016-03-26 | Disposition: A | Discharge status: Home / Self Care | Payer: 59 | Source: Ambulatory Visit | Attending: Orthopedic Surgery | Admitting: Orthopedic Surgery

## 2016-03-26 DIAGNOSIS — Z880 Allergy status to penicillin: Secondary | ICD-10-CM | POA: Diagnosis not present

## 2016-03-26 DIAGNOSIS — Z79899 Other long term (current) drug therapy: Secondary | ICD-10-CM | POA: Diagnosis not present

## 2016-03-26 DIAGNOSIS — E119 Type 2 diabetes mellitus without complications: Secondary | ICD-10-CM | POA: Diagnosis not present

## 2016-03-26 DIAGNOSIS — I10 Essential (primary) hypertension: Secondary | ICD-10-CM | POA: Diagnosis not present

## 2016-03-26 DIAGNOSIS — G8929 Other chronic pain: Secondary | ICD-10-CM | POA: Diagnosis not present

## 2016-03-26 DIAGNOSIS — Z7982 Long term (current) use of aspirin: Secondary | ICD-10-CM | POA: Diagnosis not present

## 2016-03-26 DIAGNOSIS — Z6841 Body Mass Index (BMI) 40.0 and over, adult: Secondary | ICD-10-CM | POA: Diagnosis not present

## 2016-03-26 DIAGNOSIS — M797 Fibromyalgia: Secondary | ICD-10-CM | POA: Diagnosis not present

## 2016-03-26 DIAGNOSIS — E039 Hypothyroidism, unspecified: Secondary | ICD-10-CM | POA: Diagnosis not present

## 2016-03-26 DIAGNOSIS — M7672 Peroneal tendinitis, left leg: Secondary | ICD-10-CM | POA: Diagnosis not present

## 2016-03-26 DIAGNOSIS — K219 Gastro-esophageal reflux disease without esophagitis: Secondary | ICD-10-CM | POA: Diagnosis not present

## 2016-03-26 DIAGNOSIS — M25372 Other instability, left ankle: Secondary | ICD-10-CM | POA: Diagnosis not present

## 2016-03-26 DIAGNOSIS — Z794 Long term (current) use of insulin: Secondary | ICD-10-CM | POA: Diagnosis not present

## 2016-03-26 LAB — BASIC METABOLIC PANEL
Anion gap: 13 (ref 5–15)
BUN: 9 mg/dL (ref 6–20)
CO2: 25 mmol/L (ref 22–32)
Calcium: 9.6 mg/dL (ref 8.9–10.3)
Chloride: 105 mmol/L (ref 101–111)
Creatinine, Ser: 0.77 mg/dL (ref 0.44–1.00)
GFR calc Af Amer: >60 mL/min (ref >60)
GFR calc non Af Amer: >60 mL/min (ref >60)
Glucose, Bld: 63 mg/dL — ABNORMAL LOW (ref 65–99)
Potassium: 4 mmol/L (ref 3.5–5.1)
Sodium: 143 mmol/L (ref 135–145)

## 2016-03-26 NOTE — Progress Notes (Signed)
Anesthesia consult by Dr Lauretta Grill feels pt should be done at main hospital due to her diabetes. 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  Called Dr Doran Durand office spoke with Haskell Riling she will reschedule pt at main and will call pt with changes

## 2016-03-26 NOTE — Consult Note (Signed)
Called by preop nurse to evaluate patient for procedure at day surgery. Multiple medical problems including morbid obesity, chronic tachycardia on metoprolol, fluid overload on daily lasix, hypothyroidism and poorly controlled diabetes requiring insulin pump and invokamet. She was scheduled for left ankle ligament reconstruction by Dr. Doran Durand midday on Thursday. I reviewed her last anesthetic record which required an insulin infusion for management as well being followed by [facility] diabetes care team during her hospitalization. I was told this was not possible here at day surgery. She reports a hgb A1c at 6.8 but that she is also prone to large swings in her BS from the 50s up to the 350s. I spoke with site director Dr. Al Corpus, and we both agree that given the limited diabetic resources available here and her possible need for an insulin infusion or postoperative diabetes management, it would be best to perform her surgery at [facility]. Schedulers to be contacted and Dr. Doran Durand.

## 2016-03-28 ENCOUNTER — Encounter (HOSPITAL_COMMUNITY): Payer: Self-pay | Admitting: *Deleted

## 2016-03-28 NOTE — Progress Notes (Signed)
Pt denies SOB, chest pain, and being under the care of a cardiologist. Pt denies having an echo but stated that a stress test was completed > 7 years ago. Spoke with Sonia Baller, Diabetic Coordinator, regarding pt insulin pump ( U500 ) and pre- op insulin instructions; advised to instruct pt to decrease basal rate of 2 units/hr by 20% ( 1.60). Pt made aware of diabetes protocol to check BS upon waking and every 2 hours prior to arrival , interventions for a BS < 70 and >220 and SS phone #. Pt made aware to stop  taking Aspirin, vitamins, fish oil and herbal medications. Do not take any NSAIDs ie: Ibuprofen, Advil, Naproxen, BC and Goody Powder or any medication containing Aspirin. Pt chart forwarded to anesthesia for review ( see note). Pt verbalized understanding of all pre-op instructions.

## 2016-03-28 NOTE — Progress Notes (Signed)
Anesthesia Chart Review:  Pt is a 50 year old female scheduled for L ankle lateral ligament reconstruction and possible peroneal tendon repair or tenodesis on 03/29/2016 with Dr. Doran Durand.    Pt is a same day work up.   PMH includes: HTN, DM, iron deficiency anemia, post-op N/V, GERD. Never smoker. BMI 44.5. S/p total thyroidectomy 02/18/15.  Medications include: ASA, lipitor, dexilant, lasix, invokamet, insulin on pump, levothyroxine, lisinopril, metoprolol.   BMET 03/26/16 reviewed. Glucose 63. Remaining labs will need to be obtained DOS   EKG 03/26/16: NSR. Cannot rule out Anterior infarct, old. No significant change since 02/15/15 per Dr. Doug Sou interpretation. EKG appears stable dating back to 08/23/11.    Cardiac cath 04/20/2009: 1.Preserved overall left ventricular systolic function with minimally elevated left ventricular end-diastolic pressure. 2. No significant areas of high-grade focal coronary obstruction.  Pt was originally scheduled for surgery at Day surgery, but was moved to main OR as pt required insulin infusion perioperatively and post-op Diabetes Care team management for thyroidectomy in 2016.   If labs acceptable DOS, I anticipate pt can proceed as scheduled.   Willeen Cass, FNP-BC [facility]/Anesthesiology Phone: [phone] 03/28/2016 11:52 AM

## 2016-03-29 ENCOUNTER — Ambulatory Visit (HOSPITAL_COMMUNITY): Payer: 59 | Admitting: Anesthesiology

## 2016-03-29 ENCOUNTER — Encounter (HOSPITAL_COMMUNITY)
Admission: RE | Disposition: A | Discharge status: Home / Self Care | Payer: Self-pay | Source: Ambulatory Visit | Attending: Orthopedic Surgery

## 2016-03-29 ENCOUNTER — Ambulatory Visit (HOSPITAL_COMMUNITY)
Admission: RE | Admit: 2016-03-29 | Discharge: 2016-03-29 | Disposition: A | Discharge status: Home / Self Care | Payer: 59 | Source: Ambulatory Visit | Attending: Orthopedic Surgery | Admitting: Orthopedic Surgery

## 2016-03-29 ENCOUNTER — Encounter (HOSPITAL_COMMUNITY): Payer: Self-pay | Admitting: Certified Registered Nurse Anesthetist

## 2016-03-29 DIAGNOSIS — M797 Fibromyalgia: Secondary | ICD-10-CM | POA: Insufficient documentation

## 2016-03-29 DIAGNOSIS — M7672 Peroneal tendinitis, left leg: Secondary | ICD-10-CM | POA: Diagnosis not present

## 2016-03-29 DIAGNOSIS — E039 Hypothyroidism, unspecified: Secondary | ICD-10-CM | POA: Diagnosis not present

## 2016-03-29 DIAGNOSIS — M25372 Other instability, left ankle: Secondary | ICD-10-CM | POA: Insufficient documentation

## 2016-03-29 DIAGNOSIS — G8929 Other chronic pain: Secondary | ICD-10-CM | POA: Diagnosis not present

## 2016-03-29 DIAGNOSIS — Z880 Allergy status to penicillin: Secondary | ICD-10-CM | POA: Diagnosis not present

## 2016-03-29 DIAGNOSIS — E119 Type 2 diabetes mellitus without complications: Secondary | ICD-10-CM | POA: Insufficient documentation

## 2016-03-29 DIAGNOSIS — G8918 Other acute postprocedural pain: Secondary | ICD-10-CM | POA: Diagnosis not present

## 2016-03-29 DIAGNOSIS — Z6841 Body Mass Index (BMI) 40.0 and over, adult: Secondary | ICD-10-CM | POA: Insufficient documentation

## 2016-03-29 DIAGNOSIS — K219 Gastro-esophageal reflux disease without esophagitis: Secondary | ICD-10-CM | POA: Insufficient documentation

## 2016-03-29 DIAGNOSIS — Z7982 Long term (current) use of aspirin: Secondary | ICD-10-CM | POA: Diagnosis not present

## 2016-03-29 DIAGNOSIS — I10 Essential (primary) hypertension: Secondary | ICD-10-CM | POA: Diagnosis not present

## 2016-03-29 DIAGNOSIS — M65872 Other synovitis and tenosynovitis, left ankle and foot: Secondary | ICD-10-CM | POA: Diagnosis not present

## 2016-03-29 DIAGNOSIS — Z794 Long term (current) use of insulin: Secondary | ICD-10-CM | POA: Insufficient documentation

## 2016-03-29 DIAGNOSIS — Z79899 Other long term (current) drug therapy: Secondary | ICD-10-CM | POA: Insufficient documentation

## 2016-03-29 HISTORY — DX: Tachycardia, unspecified: R00.0

## 2016-03-29 HISTORY — PX: ANKLE RECONSTRUCTION: SHX1151

## 2016-03-29 HISTORY — DX: Benign neoplasm of liver: D13.4

## 2016-03-29 LAB — GLUCOSE, CAPILLARY
Glucose-Capillary: 67 mg/dL (ref 65–99)
Glucose-Capillary: 69 mg/dL (ref 65–99)
Glucose-Capillary: 89 mg/dL (ref 65–99)

## 2016-03-29 LAB — BASIC METABOLIC PANEL
Anion gap: 14 (ref 5–15)
BUN: 9 mg/dL (ref 6–20)
CO2: 24 mmol/L (ref 22–32)
Calcium: 9.5 mg/dL (ref 8.9–10.3)
Chloride: 105 mmol/L (ref 101–111)
Creatinine, Ser: 0.84 mg/dL (ref 0.44–1.00)
GFR calc Af Amer: >60 mL/min (ref >60)
GFR calc non Af Amer: >60 mL/min (ref >60)
Glucose, Bld: 73 mg/dL (ref 65–99)
Potassium: 3.9 mmol/L (ref 3.5–5.1)
Sodium: 143 mmol/L (ref 135–145)

## 2016-03-29 LAB — CBC
HCT: 40.5 % (ref 36.0–46.0)
Hemoglobin: 13.4 g/dL (ref 12.0–15.0)
MCH: 30.7 pg (ref 26.0–34.0)
MCHC: 33.1 g/dL (ref 30.0–36.0)
MCV: 92.7 fL (ref 78.0–100.0)
Platelets: 194 10*3/uL (ref 150–400)
RBC: 4.37 MIL/uL (ref 3.87–5.11)
RDW: 13.1 % (ref 11.5–15.5)
WBC: 6.1 10*3/uL (ref 4.0–10.5)

## 2016-03-29 LAB — SURGICAL PCR SCREEN
MRSA, PCR: NEGATIVE
Staphylococcus aureus: NEGATIVE

## 2016-03-29 SURGERY — RECONSTRUCTION, ANKLE
Anesthesia: General | Site: Ankle | Laterality: Left

## 2016-03-29 MED ORDER — LIDOCAINE-EPINEPHRINE (PF) 1.5 %-1:200000 IJ SOLN
INTRAMUSCULAR | Status: DC | PRN
  Administered 2016-03-29: 15 mL via PERINEURAL

## 2016-03-29 MED ORDER — PHENYLEPHRINE 40 MCG/ML (10ML) SYRINGE FOR IV PUSH (FOR BLOOD PRESSURE SUPPORT)
PREFILLED_SYRINGE | INTRAVENOUS | Status: AC
  Filled 2016-03-29: qty 10

## 2016-03-29 MED ORDER — ARTIFICIAL TEARS OP OINT
TOPICAL_OINTMENT | OPHTHALMIC | Status: DC | PRN
  Administered 2016-03-29: 1 via OPHTHALMIC

## 2016-03-29 MED ORDER — VANCOMYCIN HCL 500 MG IV SOLR
INTRAVENOUS | Status: DC | PRN
  Administered 2016-03-29: 500 mg via TOPICAL

## 2016-03-29 MED ORDER — PHENYLEPHRINE HCL 10 MG/ML IJ SOLN
INTRAMUSCULAR | Status: DC | PRN
  Administered 2016-03-29 (×3): 120 ug via INTRAVENOUS
  Administered 2016-03-29: 40 ug via INTRAVENOUS

## 2016-03-29 MED ORDER — PROPOFOL 10 MG/ML IV BOLUS
INTRAVENOUS | Status: AC
  Filled 2016-03-29: qty 20

## 2016-03-29 MED ORDER — ACETAMINOPHEN 10 MG/ML IV SOLN
INTRAVENOUS | Status: DC | PRN
  Administered 2016-03-29: 1000 mg via INTRAVENOUS

## 2016-03-29 MED ORDER — HYDROMORPHONE HCL 1 MG/ML IJ SOLN: 0.2500 mg | INTRAMUSCULAR | Status: DC | PRN

## 2016-03-29 MED ORDER — SUCCINYLCHOLINE CHLORIDE 20 MG/ML IJ SOLN
INTRAMUSCULAR | Status: DC | PRN
  Administered 2016-03-29: 140 mg via INTRAVENOUS

## 2016-03-29 MED ORDER — GLYCOPYRROLATE 0.2 MG/ML IJ SOLN
INTRAMUSCULAR | Status: DC | PRN
  Administered 2016-03-29: 0.2 mg via INTRAVENOUS

## 2016-03-29 MED ORDER — MIDAZOLAM HCL 2 MG/2ML IJ SOLN
INTRAMUSCULAR | Status: DC | PRN
Start: 2016-03-29 — End: 2016-03-29
  Administered 2016-03-29: 2 mg via INTRAVENOUS

## 2016-03-29 MED ORDER — MUPIROCIN 2 % EX OINT
TOPICAL_OINTMENT | CUTANEOUS | Status: AC
  Administered 2016-03-29: 1
  Filled 2016-03-29: qty 22

## 2016-03-29 MED ORDER — FENTANYL CITRATE (PF) 250 MCG/5ML IJ SOLN
INTRAMUSCULAR | Status: AC
  Filled 2016-03-29: qty 5

## 2016-03-29 MED ORDER — SUCCINYLCHOLINE CHLORIDE 20 MG/ML IJ SOLN
INTRAMUSCULAR | Status: AC
  Filled 2016-03-29: qty 1

## 2016-03-29 MED ORDER — 0.9 % SODIUM CHLORIDE (POUR BTL) OPTIME
TOPICAL | Status: DC | PRN
  Administered 2016-03-29: 1000 mL

## 2016-03-29 MED ORDER — VANCOMYCIN HCL IN DEXTROSE 1-5 GM/200ML-% IV SOLN
1000.0000 mg | INTRAVENOUS | Status: AC
  Administered 2016-03-29: 1000 mg via INTRAVENOUS
  Filled 2016-03-29: qty 200

## 2016-03-29 MED ORDER — LACTATED RINGERS IV SOLN
INTRAVENOUS | Status: DC
  Administered 2016-03-29 (×2): via INTRAVENOUS

## 2016-03-29 MED ORDER — FENTANYL CITRATE (PF) 100 MCG/2ML IJ SOLN
50.0000 ug | INTRAMUSCULAR | Status: DC | PRN
  Filled 2016-03-29: qty 2

## 2016-03-29 MED ORDER — PROPOFOL 500 MG/50ML IV EMUL
INTRAVENOUS | Status: DC | PRN
  Administered 2016-03-29: 25 ug/kg/min via INTRAVENOUS
  Administered 2016-03-29: 09:00:00 via INTRAVENOUS

## 2016-03-29 MED ORDER — GLYCOPYRROLATE 0.2 MG/ML IJ SOLN
INTRAMUSCULAR | Status: AC
  Filled 2016-03-29: qty 1

## 2016-03-29 MED ORDER — DOCUSATE SODIUM 100 MG PO CAPS: 100.0000 mg | ORAL_CAPSULE | Freq: Two times a day (BID) | ORAL | Status: DC

## 2016-03-29 MED ORDER — VANCOMYCIN HCL 500 MG IV SOLR
INTRAVENOUS | Status: AC
Start: 2016-03-29 — End: 2016-03-29
  Filled 2016-03-29: qty 500

## 2016-03-29 MED ORDER — METOPROLOL TARTRATE 1 MG/ML IV SOLN
INTRAVENOUS | Status: AC
  Filled 2016-03-29: qty 5

## 2016-03-29 MED ORDER — METOPROLOL TARTRATE 1 MG/ML IV SOLN
INTRAVENOUS | Status: DC | PRN
  Administered 2016-03-29: 2 mg via INTRAVENOUS
  Administered 2016-03-29 (×3): 1 mg via INTRAVENOUS

## 2016-03-29 MED ORDER — ONDANSETRON HCL 4 MG/2ML IJ SOLN
INTRAMUSCULAR | Status: AC
  Filled 2016-03-29: qty 2

## 2016-03-29 MED ORDER — FENTANYL CITRATE (PF) 250 MCG/5ML IJ SOLN
INTRAMUSCULAR | Status: DC | PRN
  Administered 2016-03-29: 50 ug via INTRAVENOUS
  Administered 2016-03-29: 100 ug via INTRAVENOUS
  Administered 2016-03-29 (×2): 50 ug via INTRAVENOUS

## 2016-03-29 MED ORDER — ACETAMINOPHEN 10 MG/ML IV SOLN
INTRAVENOUS | Status: AC
Start: 2016-03-29 — End: 2016-03-29
  Filled 2016-03-29: qty 100

## 2016-03-29 MED ORDER — ROCURONIUM BROMIDE 50 MG/5ML IV SOLN
INTRAVENOUS | Status: AC
  Filled 2016-03-29: qty 1

## 2016-03-29 MED ORDER — GLYCOPYRROLATE 0.2 MG/ML IJ SOLN: 0.2000 mg | Freq: Once | INTRAMUSCULAR | Status: DC | PRN

## 2016-03-29 MED ORDER — LIDOCAINE HCL (CARDIAC) 20 MG/ML IV SOLN
INTRAVENOUS | Status: AC
  Filled 2016-03-29: qty 5

## 2016-03-29 MED ORDER — MIDAZOLAM HCL 2 MG/2ML IJ SOLN
INTRAMUSCULAR | Status: AC
  Filled 2016-03-29: qty 2

## 2016-03-29 MED ORDER — MIDAZOLAM HCL 2 MG/2ML IJ SOLN
1.0000 mg | INTRAMUSCULAR | Status: DC | PRN
  Filled 2016-03-29: qty 2

## 2016-03-29 MED ORDER — ONDANSETRON HCL 4 MG/2ML IJ SOLN
INTRAMUSCULAR | Status: DC | PRN
  Administered 2016-03-29: 4 mg via INTRAVENOUS

## 2016-03-29 MED ORDER — OXYCODONE HCL 5 MG PO TABS: 5.0000 mg | ORAL_TABLET | ORAL | Status: DC | PRN

## 2016-03-29 MED ORDER — BUPIVACAINE HCL (PF) 0.5 % IJ SOLN
INTRAMUSCULAR | Status: DC | PRN
  Administered 2016-03-29: 25 mL via PERINEURAL

## 2016-03-29 MED ORDER — PROPOFOL 10 MG/ML IV BOLUS
INTRAVENOUS | Status: DC | PRN
  Administered 2016-03-29: 200 mg via INTRAVENOUS
  Administered 2016-03-29: 100 mg via INTRAVENOUS

## 2016-03-29 MED ORDER — SODIUM CHLORIDE 0.9 % IV SOLN: INTRAVENOUS | Status: DC

## 2016-03-29 MED ORDER — SCOPOLAMINE 1 MG/3DAYS TD PT72
1.0000 | MEDICATED_PATCH | Freq: Once | TRANSDERMAL | Status: DC | PRN
  Administered 2016-03-29: 1.5 mg via TRANSDERMAL
  Filled 2016-03-29: qty 1

## 2016-03-29 MED ORDER — OXYCODONE HCL 5 MG PO TABS
ORAL_TABLET | ORAL | Status: AC
  Filled 2016-03-29: qty 2

## 2016-03-29 MED ORDER — SENNA 8.6 MG PO TABS: 2.0000 | ORAL_TABLET | Freq: Two times a day (BID) | ORAL | Status: DC

## 2016-03-29 MED ORDER — ARTIFICIAL TEARS OP OINT
TOPICAL_OINTMENT | OPHTHALMIC | Status: AC
  Filled 2016-03-29: qty 3.5

## 2016-03-29 MED ORDER — LIDOCAINE HCL (CARDIAC) 20 MG/ML IV SOLN
INTRAVENOUS | Status: DC | PRN
  Administered 2016-03-29: 60 mg via INTRATRACHEAL

## 2016-03-29 MED ORDER — PROPOFOL 10 MG/ML IV BOLUS
INTRAVENOUS | Status: AC
Start: 2016-03-29 — End: 2016-03-29
  Filled 2016-03-29: qty 20

## 2016-03-29 MED ORDER — ASPIRIN EC 81 MG PO TBEC: 81.0000 mg | DELAYED_RELEASE_TABLET | Freq: Two times a day (BID) | ORAL | Status: DC

## 2016-03-29 MED ORDER — CHLORHEXIDINE GLUCONATE 4 % EX LIQD: 60.0000 mL | Freq: Once | CUTANEOUS | Status: DC

## 2016-03-29 MED ORDER — OXYCODONE HCL 5 MG PO TABS
5.0000 mg | ORAL_TABLET | Freq: Once | ORAL | Status: AC
  Administered 2016-03-29: 10 mg via ORAL

## 2016-03-29 SURGICAL SUPPLY — 57 items
ANCH SUT 1 SHRT SM RGD INSRTR (Anchor) ×2 IMPLANT
ANCHOR SUT 1.45 SZ 1 SHORT (Anchor) ×2 IMPLANT
BANDAGE ESMARK 6X9 LF (GAUZE/BANDAGES/DRESSINGS) ×1 IMPLANT
BLADE SURG 10 STRL SS (BLADE) ×2 IMPLANT
BNDG CMPR 9X6 STRL LF SNTH (GAUZE/BANDAGES/DRESSINGS) ×1
BNDG COHESIVE 4X5 TAN STRL (GAUZE/BANDAGES/DRESSINGS) ×2 IMPLANT
BNDG COHESIVE 6X5 TAN STRL LF (GAUZE/BANDAGES/DRESSINGS) ×2 IMPLANT
BNDG ESMARK 6X9 LF (GAUZE/BANDAGES/DRESSINGS) ×2
CANISTER SUCT 3000ML PPV (MISCELLANEOUS) ×2 IMPLANT
CHLORAPREP W/TINT 26ML (MISCELLANEOUS) ×2 IMPLANT
COVER SURGICAL LIGHT HANDLE (MISCELLANEOUS) ×2 IMPLANT
CUFF TOURNIQUET SINGLE 34IN LL (TOURNIQUET CUFF) ×2 IMPLANT
CUFF TOURNIQUET SINGLE 44IN (TOURNIQUET CUFF) IMPLANT
DRAPE C-ARM 42X72 X-RAY (DRAPES) ×2 IMPLANT
DRSG ADAPTIC 3X8 NADH LF (GAUZE/BANDAGES/DRESSINGS) IMPLANT
DRSG PAD ABDOMINAL 8X10 ST (GAUZE/BANDAGES/DRESSINGS) ×2 IMPLANT
ELECT REM PT RETURN 9FT ADLT (ELECTROSURGICAL) ×2
ELECTRODE REM PT RTRN 9FT ADLT (ELECTROSURGICAL) ×1 IMPLANT
GAUZE SPONGE 4X4 12PLY STRL (GAUZE/BANDAGES/DRESSINGS) ×1 IMPLANT
GLOVE BIO SURGEON STRL SZ7 (GLOVE) ×4 IMPLANT
GLOVE BIO SURGEON STRL SZ8 (GLOVE) ×2 IMPLANT
GLOVE BIOGEL PI IND STRL 7.5 (GLOVE) ×1 IMPLANT
GLOVE BIOGEL PI IND STRL 8 (GLOVE) ×1 IMPLANT
GLOVE BIOGEL PI INDICATOR 7.5 (GLOVE) ×1
GLOVE BIOGEL PI INDICATOR 8 (GLOVE) ×1
GOWN STRL REUS W/ TWL LRG LVL3 (GOWN DISPOSABLE) ×2 IMPLANT
GOWN STRL REUS W/ TWL XL LVL3 (GOWN DISPOSABLE) ×1 IMPLANT
GOWN STRL REUS W/TWL LRG LVL3 (GOWN DISPOSABLE) ×4
GOWN STRL REUS W/TWL XL LVL3 (GOWN DISPOSABLE) ×2
KIT BASIN OR (CUSTOM PROCEDURE TRAY) ×2 IMPLANT
KIT ROOM TURNOVER OR (KITS) ×2 IMPLANT
NEEDLE 22X1 1/2 (OR ONLY) (NEEDLE) IMPLANT
NS IRRIG 1000ML POUR BTL (IV SOLUTION) ×2 IMPLANT
PACK ORTHO EXTREMITY (CUSTOM PROCEDURE TRAY) ×2 IMPLANT
PAD ARMBOARD 7.5X6 YLW CONV (MISCELLANEOUS) ×4 IMPLANT
PAD CAST 4YDX4 CTTN HI CHSV (CAST SUPPLIES) ×1 IMPLANT
PADDING CAST COTTON 4X4 STRL (CAST SUPPLIES) ×4
PADDING CAST COTTON 6X4 STRL (CAST SUPPLIES) ×1 IMPLANT
SOAP 2 % CHG 4 OZ (WOUND CARE) ×2 IMPLANT
SPLINT PLASTER CAST XFAST 5X30 (CAST SUPPLIES) IMPLANT
SPLINT PLASTER XFAST SET 5X30 (CAST SUPPLIES) ×1
STAPLER VISISTAT 35W (STAPLE) IMPLANT
SUCTION FRAZIER HANDLE 10FR (MISCELLANEOUS) ×1
SUCTION TUBE FRAZIER 10FR DISP (MISCELLANEOUS) ×1 IMPLANT
SUT ETHILON 3 0 PS 1 (SUTURE) ×1 IMPLANT
SUT PROLENE 3 0 PS 2 (SUTURE) ×2 IMPLANT
SUT VIC AB 0 CT1 27 (SUTURE) ×2
SUT VIC AB 0 CT1 27XBRD ANBCTR (SUTURE) IMPLANT
SUT VIC AB 2-0 CT1 27 (SUTURE) ×4
SUT VIC AB 2-0 CT1 TAPERPNT 27 (SUTURE) ×2 IMPLANT
SUT VIC AB 3-0 PS2 18 (SUTURE) ×2
SUT VIC AB 3-0 PS2 18XBRD (SUTURE) ×1 IMPLANT
SYR CONTROL 10ML LL (SYRINGE) IMPLANT
TOWEL OR 17X24 6PK STRL BLUE (TOWEL DISPOSABLE) ×2 IMPLANT
TOWEL OR 17X26 10 PK STRL BLUE (TOWEL DISPOSABLE) ×2 IMPLANT
TUBE CONNECTING 12X1/4 (SUCTIONS) ×2 IMPLANT
WATER STERILE IRR 1000ML POUR (IV SOLUTION) ×2 IMPLANT

## 2016-03-29 NOTE — Progress Notes (Signed)
Report given to maryann shaver rn as caregiver 

## 2016-03-29 NOTE — H&P (Signed)
[name] is an 50 y.o. female.   Chief Complaint: left ankle pain HPI: 50 y/o female with pmh of diabetes c/o chronic left ankle pain and instability for about a year.  She has failed extensive treatment with PT, bracing, pain meds and immobilization.  She presents now for operative treatment of the left ankle.  Past Medical History  Diagnosis Date  . Hypertension   . GERD (gastroesophageal reflux disease)   . Fibromyalgia   . Proteinuria   . PONV (postoperative nausea and vomiting)   . Anginal pain ([place])     chest pain  heart cath done  . History of hiatal hernia   . Kidney stones   . High cholesterol   . Chronic bronchitis ([place])     "get it q yr"  . Hypothyroidism   . Type II diabetes mellitus ([place])   . Iron deficiency anemia     "comes and goes"  . Migraine     "@ least once/month" (02/18/2015)  . History of gout   . Cancer ([place])   . Tachycardia   . Hepatic adenoma     Past Surgical History  Procedure Laterality Date  . Lumbar disc surgery  08/2011  . Colonoscopy    . Esophagogastroduodenoscopy    . Total thyroidectomy  02/18/2015  . Back surgery    . Cystoscopy with ureteroscopy, stone basketry and stent placement  ~ 2006  . Abdominal hysterectomy  ~2012    "lap"  . Cardiac catheterization  04/2009    "clean"  . Laparoscopic cholecystectomy  05/2004  . Thyroidectomy N/A 02/18/2015    Procedure: TOTAL THYROIDECTOMY;  Surgeon: [name], MD;  Location: [facility] OR;  Service: General;  Laterality: N/A;    Family History  Problem Relation Age of Onset  . Hypertension Mother   . Emphysema Mother   . AAA (abdominal aortic aneurysm) Mother   . Hypertension Father   . Coronary artery disease Father   . Cancer - Prostate Father   . Renal Disease Father   . Hypertension Brother    Social History:  reports that she has never smoked. She has never used smokeless tobacco. She reports that she does not drink alcohol or use illicit drugs.  Allergies:  Allergies   Allergen Reactions  . Penicillins Anaphylaxis, Shortness Of Breath, Swelling and Palpitations    Has patient had a PCN reaction causing immediate rash, facial/tongue/throat swelling, SOB or lightheadedness with hypotension: Yes Has patient had a PCN reaction causing severe rash involving mucus membranes or skin necrosis: No Has patient had a PCN reaction that required hospitalization No Has patient had a PCN reaction occurring within the last 10 years: No If all of the above answers are "NO", then may proceed with Cephalosporin use.   . Adhesive [Tape] Itching    Redness  . Clarithromycin Nausea And Vomiting    Any antibiotic (doxyclycine, etc)  . Milk-Related Compounds Other (See Comments)    Patient preference, does not like  . Moxifloxacin Nausea And Vomiting    Needs Zofran or Phenergan  . Latex Itching and Rash    Blisters with prolonged contact    Medications Prior to Admission  Medication Sig Dispense Refill  . acetaminophen (TYLENOL) 500 MG tablet Take 1,000 mg by mouth every 6 (six) hours as needed for mild pain.    Marland Kitchen ALPRAZolam (XANAX) 0.5 MG tablet Take 0.5 mg by mouth every 6 (six) hours as needed for anxiety or sleep.     Marland Kitchen  aspirin EC 81 MG tablet Take 81 mg by mouth every morning.     Marland Kitchen atorvastatin (LIPITOR) 20 MG tablet Take 20 mg by mouth daily at 6 PM.     . BAYER CONTOUR NEXT TEST test strip   4  . Biotin (BIOTIN 5000) 5 MG CAPS Take 5 mg by mouth at bedtime.    . calcium elemental as carbonate (TUMS ULTRA 1000) 400 MG chewable tablet Chew 2,000 mg by mouth at bedtime as needed for heartburn.    . cyclobenzaprine (FLEXERIL) 10 MG tablet Take 10 mg by mouth at bedtime as needed for muscle spasms.     . D3-50 50000 UNITS capsule Take 50,000 Units by mouth once a week. Saturday  11  . Dermatological Products, Misc. (HYLATOPIC PLUS) CREA Apply 1 application topically 4 (four) times daily.    Marland Kitchen dexlansoprazole (DEXILANT) 60 MG capsule Take 60 mg by mouth every morning.  0600    . fluticasone (FLONASE) 50 MCG/ACT nasal spray Place 1 spray into the nose daily. Reported on 12/27/2015    . furosemide (LASIX) 40 MG tablet Take 40 mg by mouth 2 (two) times daily.      Marland Kitchen gabapentin (NEURONTIN) 300 MG capsule TAKE 1 CAPSULE (300 MG TOTAL) BY MOUTH AT BEDTIME. 30 capsule 3  . halobetasol (ULTRAVATE) 0.05 % cream Apply 1 application topically 2 (two) times daily.    Marland Kitchen HYDROcodone-acetaminophen (NORCO) 7.5-325 MG per tablet Take 1 tablet by mouth every 6 (six) hours as needed for moderate pain.    Marland Kitchen ibuprofen (ADVIL,MOTRIN) 200 MG tablet Take 600-800 mg by mouth every 8 (eight) hours as needed for moderate pain.    Marland Kitchen insulin regular human CONCENTRATED (HUMULIN R) 500 UNIT/ML SOLN injection Inject 2 Units into the skin. 2.0 units /hour 0500-2359, 1.5units 8887-5797     . INVOKAMET 50-1000 MG TABS Take 1 tablet by mouth 2 (two) times daily.   5  . levothyroxine (SYNTHROID, LEVOTHROID) 125 MCG tablet Take 250 mcg by mouth daily before breakfast. 0600    . lisinopril (PRINIVIL,ZESTRIL) 40 MG tablet Take 40 mg by mouth 2 (two) times daily.     . methocarbamol (ROBAXIN) 500 MG tablet Take 500 mg by mouth 2 (two) times daily.    . metoprolol (TOPROL-XL) 50 MG 24 hr tablet Take 100 mg by mouth 2 (two) times daily.     . Multiple Vitamins-Minerals (MULTIVITAMIN PO) Take 2 tablets by mouth at bedtime. gummies    . ondansetron (ZOFRAN-ODT) 4 MG disintegrating tablet Take 4 mg by mouth every 6 (six) hours as needed for nausea or vomiting.    . dicyclomine (BENTYL) 10 MG capsule Take 10 mg by mouth 2 (two) times daily as needed (for IBS flare).   11  . PROCTOSOL HC 2.5 % rectal cream Apply 1 application topically daily as needed for hemorrhoids.   2    Results for orders placed or performed during the hospital encounter of 03/29/16 (from the past 48 hour(s))  Glucose, capillary     Status: None   Collection Time: 03/29/16  6:30 AM  Result Value Ref Range   Glucose-Capillary 67 65 -  99 mg/dL  Basic metabolic panel     Status: None   Collection Time: 03/29/16  7:07 AM  Result Value Ref Range   Sodium 143 135 - 145 mmol/L   Potassium 3.9 3.5 - 5.1 mmol/L   Chloride 105 101 - 111 mmol/L   CO2 24 22 - 32 mmol/L  Glucose, Bld 73 65 - 99 mg/dL   BUN 9 6 - 20 mg/dL   Creatinine, Ser 0.84 0.44 - 1.00 mg/dL   Calcium 9.5 8.9 - 10.3 mg/dL   GFR calc non Af Amer >60 >60 mL/min   GFR calc Af Amer >60 >60 mL/min    Comment: (NOTE) The eGFR has been calculated using the CKD EPI equation. This calculation has not been validated in all clinical situations. eGFR's persistently <60 mL/min signify possible Chronic Kidney Disease.    Anion gap 14 5 - 15  CBC     Status: None   Collection Time: Apr 25, 2016  7:07 AM  Result Value Ref Range   WBC 6.1 4.0 - 10.5 K/uL   RBC 4.37 3.87 - 5.11 MIL/uL   Hemoglobin 13.4 12.0 - 15.0 g/dL   HCT 40.5 36.0 - 46.0 %   MCV 92.7 78.0 - 100.0 fL   MCH 30.7 26.0 - 34.0 pg   MCHC 33.1 30.0 - 36.0 g/dL   RDW 13.1 11.5 - 15.5 %   Platelets 194 150 - 400 K/uL  Glucose, capillary     Status: None   Collection Time: Apr 25, 2016  7:22 AM  Result Value Ref Range   Glucose-Capillary 69 65 - 99 mg/dL   No results found.  ROS  No recent f/c/n/v/wt loss  Blood pressure 118/72, pulse 96, temperature 97.3 F (36.3 C), temperature source Oral, resp. rate 20, height '5\' 6"'  (1.676 m), weight 125.193 kg (276 lb), SpO2 98 %. Physical Exam  wn wd woman in nad.  A and O x 4.  Mood and affect normal.  EOMI.  resp unlabored.  L ankle with slight swelling laterally.  No lymphadenopathy.  5/5 strength in PF and DF.  Sens to LT diminshed at the forefoot.  Pulses palpable.  Assessment/Plan L ankle chronic instability and pain - to OR for left ankle lateral ligament reconstruction and possible peroneal tendon repair.  The risks and benefits of the alternative treatment options have been discussed in detail.  The patient wishes to proceed with surgery and specifically  understands risks of bleeding, infection, nerve damage, blood clots, need for additional surgery, amputation and death.   Wylene Simmer, MD 04/25/16, 8:00 AM

## 2016-03-29 NOTE — Anesthesia Postprocedure Evaluation (Signed)
Anesthesia Post Note  Patient: [name]  Procedure(s) Performed: Procedure(s) (LRB): LEFT ANKLE LATERAL LIGAMENT RECONSTRUCTION AND PERONEAL TENDON REPAIR OR TENOLYSIS (Left)  Patient location during evaluation: PACU Anesthesia Type: General and Regional Level of consciousness: awake and alert Pain management: pain level controlled Vital Signs Assessment: post-procedure vital signs reviewed and stable Respiratory status: spontaneous breathing, nonlabored ventilation, respiratory function stable and patient connected to nasal cannula oxygen Cardiovascular status: blood pressure returned to baseline and stable Postop Assessment: no signs of nausea or vomiting Anesthetic complications: no    Last Vitals:  Filed Vitals:   03/29/16 0933 03/29/16 0945  BP: 127/50 102/54  Pulse: 95 96  Temp: 36.7 C   Resp: 16     Last Pain:  Filed Vitals:   03/29/16 0959  PainSc: 0-No pain                 Salik Grewell S

## 2016-03-29 NOTE — Transfer of Care (Signed)
Immediate Anesthesia Transfer of Care Note  Patient: [name]  Procedure(s) Performed: Procedure(s): LEFT ANKLE LATERAL LIGAMENT RECONSTRUCTION AND PERONEAL TENDON REPAIR OR TENOLYSIS (Left)  Patient Location: PACU  Anesthesia Type:General and Regional  Level of Consciousness: awake, alert , oriented and patient cooperative  Airway & Oxygen Therapy: Patient Spontanous Breathing and Patient connected to face mask oxygen  Post-op Assessment: Report given to RN, Post -op Vital signs reviewed and stable, Patient moving all extremities X 4 and Patient able to stick tongue midline  Post vital signs: Reviewed and stable  Last Vitals:  Filed Vitals:   03/29/16 0658  BP: 118/72  Pulse: 96  Temp: 36.3 C  Resp: 20    Complications: No apparent anesthesia complications

## 2016-03-29 NOTE — Progress Notes (Signed)
Care of pt assumed by MA Justo Hengel RN 

## 2016-03-29 NOTE — Discharge Instructions (Signed)
Jenna Hewitt, MD °[facility] ° °Please read the following information regarding your care after surgery. ° °Medications  °You only need a prescription for the narcotic pain medicine (ex. oxycodone, Percocet, Norco).  All of the other medicines listed below are available over the counter. °X acetominophen (Tylenol) 650 mg every 4-6 hours as you need for minor pain °X oxycodone as prescribed for moderate to severe pain °?  ° °Narcotic pain medicine (ex. oxycodone, Percocet, Vicodin) will cause constipation.  To prevent this problem, take the following medicines while you are taking any pain medicine. °X docusate sodium (Colace) 100 mg twice a day X senna (Senokot) 2 tablets twice a day ° °X To help prevent blood clots, take a baby aspirin (81 mg) twice a day after surgery until you are allowed to initiate weightbearing on your operative extremity.  You should also get up every hour while you are awake to move around.   ° °Weight Bearing °X Do not bear any weight on the operated leg or foot. ° °Cast / Splint / Dressing °X Keep your splint or cast clean and dry.  Don’t put anything (coat hanger, pencil, etc) down inside of it.  If it gets damp, use a hair dryer on the cool setting to dry it.  If it gets soaked, call the office to schedule an appointment for a cast change. °  ° °After your dressing, cast or splint is removed; you may shower, but do not soak or scrub the wound.  Allow the water to run over it, and then gently pat it dry. ° °Swelling °It is normal for you to have swelling where you had surgery.  To reduce swelling and pain, keep your toes above your nose for at least 3 days after surgery.  It may be necessary to keep your foot or leg elevated for several weeks.  If it hurts, it should be elevated. ° °Follow Up °Call my office at [phone] when you are discharged from the hospital or surgery center to schedule an appointment to be seen two weeks after surgery. ° °Call my office at  [phone] if you develop a fever >101.5° F, nausea, vomiting, bleeding from the surgical site or severe pain.   ° ° °

## 2016-03-29 NOTE — Op Note (Signed)
NAMEKATHI, [name]       ACCOUNT NO.:  [id number]  MEDICAL RECORD NO.:  [id number]  LOCATION:  [facility]                         FACILITY:  [facility]  PHYSICIAN:  Wylene Simmer, MD        DATE OF BIRTH:  08-29-1966  DATE OF PROCEDURE:  03/29/2016 DATE OF DISCHARGE:                              OPERATIVE REPORT   PREOPERATIVE DIAGNOSES: 1. Left ankle chronic instability. 2. Left peroneal tendinitis.  POSTOPERATIVE DIAGNOSES: 1. Left ankle chronic instability. 2. Left peroneal tendinitis.  PROCEDURE: 1. Left ankle lateral ligament reconstruction. 2. Left peroneus brevis tenolysis. 3. Left peroneus longus tenolysis.  SURGEON:  Wylene Simmer, M.D.  ASSISTANT:  Mechele Claude, PA-C.  ANESTHESIA:  General, regional.  ESTIMATED BLOOD LOSS:  Minimal.  TOURNIQUET TIME:  46 minutes at 350 mmHg.  COMPLICATIONS:  None apparent.  DISPOSITION:  Extubated, awake, and stable to recovery.  INDICATION FOR PROCEDURE:  The patient is a 50 year old woman with past medical history significant for diabetes and peripheral neuropathy.  She had an injury to her left ankle over a year ago.  This has resulted in chronic pain due to lateral ankle instability.  She has failed nonoperative treatment to date including activity modification, oral anti-inflammatories, shoe wear modification, custom orthotics, and ankle bracing, as well as extensive physical therapy, and use of assistive devices.  She presents now for operative treatment of this painful condition.  She understands the risks and benefits, the alternative treatment options, and elects surgical treatment.  She specifically understands risks of bleeding, infection, nerve damage, blood clots, need for additional surgery, continued pain, recurrence of her instability, amputation, and death.  PROCEDURE IN DETAIL:  After preoperative consent was obtained and the correct operative site was identified, the patient was brought to the operating  room and placed supine on the operating table.  General anesthesia was induced.  Preoperative antibiotics were administered. Surgical time-out was taken.  Left lower extremity was prepped and draped in standard sterile fashion with tourniquet around the thigh. The extremity was exsanguinated, and the tourniquet was inflated to 350 mmHg.  A curvilinear incision was then made along the lateral aspect of the ankle adjacent to the peroneal tendons.  Sharp dissection was carried down through the skin and subcutaneous tissue.  The peroneal tendon sheath was incised.  Superior peroneal retinaculum was elevated from its insertion.  The peroneus longus and brevis tendons were then carefully inspected.  They were both noted to have extensive tenosynovitis.  No significant tear was noted.  Careful tenolysis was then performed with scalpel and scissors removing all of the tenosynovitis from both the tendons and the tendon sheath.  Attention was then turned to the lateral aspect of the fibula.  The anterior talofibular ligament and calcaneofibular ligament origins were identified.  These were released from the anterior and inferior aspects of the fibula in subperiosteal fashion.  The lateral gutter was inspected and was noted to have healthy and intact cartilage.  There were a couple of small bone spurs that were removed with a rongeur as well as some lateral gutter synovitis.  The lateral aspect of the posterior facet of the subtalar joint was then inspected, and there was no evidence of any degenerative changes.  The anterior-inferior aspect of the distal fibula was then decorticated with a rongeur.  Two Biomet juggernaut anchors were inserted and both were noted to have excellent purchase.  The hindfoot was then held in a neutral and everted position and the calcaneofibular and anterior talofibular ligaments were repaired back to the appropriate origin at the anterior aspect of the fibula and they  were tightened appropriately.  The extensor retinaculum was then advanced onto the anterior fibula with horizontal mattress sutures using the same anchors.  Suture was then passed beneath the periosteal flap of the fibula and it was pulled down over the repair.  The superior peroneal retinaculum was then repaired through drill holes in the trailing edge of the fibula with 0 Vicryl horizontal mattress sutures. The peroneal tendon sheath was closed with simple sutures of 0 Vicryl. The subcutaneous tissues were approximated with inverted simple sutures of 2-0 Vicryl after vancomycin powder was sprinkled in the wound.  Skin incision was closed with a running 3-0 nylon.  Sterile dressings were applied followed by a well-padded short-leg splint with the hindfoot positioned in eversion.  Tourniquet was released after application of the dressings at 46 minutes.  The patient was awakened from anesthesia and transported to the recovery room in stable condition.  FOLLOWUP PLAN:  The patient will be nonweightbearing in the left lower extremity.  She will follow up with me in the office in 2 weeks for suture removal and conversion to a walking cast.  She will take aspirin for DVT prophylaxis while she is nonweightbearing.  Mechele Claude, PA-C, was present and scrubbed for the duration of the case.  His assistance was essential in positioning the patient, prepping and draping, gaining and maintaining exposure, performing the operation, closing and dressing the wounds, and applying the splint.     Wylene Simmer, MD     JH/MEDQ  D:  03/29/2016  T:  03/29/2016  Job:  [id number]

## 2016-03-29 NOTE — Anesthesia Procedure Notes (Addendum)
Anesthesia Regional Block:  Popliteal block  Pre-Anesthetic Checklist: ,, timeout performed, Correct Patient, Correct Site, Correct Laterality, Correct Procedure, Correct Position, site marked, Risks and benefits discussed,  Surgical consent,  Pre-op evaluation,  At surgeon's request and post-op pain management  Laterality: Left  Prep: chloraprep       Needles:  Injection technique: Single-shot  Needle Type: Echogenic Stimulator Needle     Needle Length: 9cm 9 cm Needle Gauge: 21 and 21 G    Additional Needles:  Procedures: ultrasound guided (picture in chart) Popliteal block Narrative:  Injection made incrementally with aspirations every 5 mL.  Performed by: Personally   Additional Notes: Patient tolerated the procedure well without complications   Procedure Name: Intubation Date/Time: 03/29/2016 8:15 AM Performed by: Collier Bullock Pre-anesthesia Checklist: Patient identified, Emergency Drugs available, Suction available and Patient being monitored Patient Re-evaluated:Patient Re-evaluated prior to inductionOxygen Delivery Method: Circle system utilized Preoxygenation: Pre-oxygenation with 100% oxygen Intubation Type: IV induction Ventilation: Mask ventilation without difficulty Laryngoscope Size: Mac and 3 Grade View: Grade I Tube type: Oral Tube size: 7.0 mm Airway Equipment and Method: Stylet Placement Confirmation: ETT inserted through vocal cords under direct vision,  positive ETCO2 and breath sounds checked- equal and bilateral Secured at: 21 cm Tube secured with: Tape Dental Injury: Teeth and Oropharynx as per pre-operative assessment

## 2016-03-29 NOTE — Brief Op Note (Signed)
03/29/2016  9:52 AM  PATIENT:  [name]  50 y.o. female  PRE-OPERATIVE DIAGNOSIS: 1.  Left ankle chronic instability      2.  Left peroneal tendonitis  POST-OPERATIVE DIAGNOSIS: Same  Procedure(s): 1.  LEFT ANKLE LATERAL LIGAMENT RECONSTRUCTION 2.  Left peroneus brevis tenolysis 3.  Left peroneus longus tenolysis  SURGEON:  Wylene Simmer, MD  ASSISTANT: Mechele Claude, PA-C  ANESTHESIA:   General, regional  EBL:  minimal   TOURNIQUET:  46 min at [id number] mm Hg  COMPLICATIONS:  None apparent  DISPOSITION:  Extubated, awake and stable to recovery.  DICTATION ID:

## 2016-03-29 NOTE — Anesthesia Preprocedure Evaluation (Signed)
Anesthesia Evaluation  Patient identified by MRN, date of birth, ID band Patient awake    Reviewed: Allergy & Precautions, NPO status , Patient's Chart, lab work & pertinent test results  Airway Mallampati: II  TM Distance: <3 FB Neck ROM: Full    Dental no notable dental hx.    Pulmonary neg pulmonary ROS,    breath sounds clear to auscultation + decreased breath sounds      Cardiovascular hypertension, Pt. on medications Normal cardiovascular exam Rhythm:Regular Rate:Normal     Neuro/Psych negative neurological ROS  negative psych ROS   GI/Hepatic Neg liver ROS, GERD  Medicated,  Endo/Other  diabetes, Insulin DependentHypothyroidism Morbid obesity  Renal/GU negative Renal ROS  negative genitourinary   Musculoskeletal negative musculoskeletal ROS (+)   Abdominal   Peds negative pediatric ROS (+)  Hematology negative hematology ROS (+)   Anesthesia Other Findings   Reproductive/Obstetrics negative OB ROS                             Anesthesia Physical Anesthesia Plan  ASA: III  Anesthesia Plan: General   Post-op Pain Management:    Induction: Intravenous  Airway Management Planned: Oral ETT  Additional Equipment:   Intra-op Plan:   Post-operative Plan: Extubation in OR  Informed Consent: I have reviewed the patients History and Physical, chart, labs and discussed the procedure including the risks, benefits and alternatives for the proposed anesthesia with the patient or authorized representative who has indicated his/her understanding and acceptance.   Dental advisory given  Plan Discussed with: CRNA and Surgeon  Anesthesia Plan Comments:         Anesthesia Quick Evaluation

## 2016-03-30 ENCOUNTER — Encounter (HOSPITAL_COMMUNITY): Payer: Self-pay | Admitting: Orthopedic Surgery

## 2016-03-30 LAB — HEMOGLOBIN A1C
Hgb A1c MFr Bld: 6.8 % — ABNORMAL HIGH (ref 4.8–5.6)
Mean Plasma Glucose: 148 mg/dL

## 2016-04-06 DIAGNOSIS — E108 Type 1 diabetes mellitus with unspecified complications: Secondary | ICD-10-CM | POA: Diagnosis not present

## 2016-04-13 ENCOUNTER — Other Ambulatory Visit: Payer: Self-pay | Admitting: Neurosurgery

## 2016-04-13 DIAGNOSIS — M5416 Radiculopathy, lumbar region: Secondary | ICD-10-CM | POA: Diagnosis not present

## 2016-04-13 DIAGNOSIS — Z4789 Encounter for other orthopedic aftercare: Secondary | ICD-10-CM | POA: Diagnosis not present

## 2016-04-13 DIAGNOSIS — M25572 Pain in left ankle and joints of left foot: Secondary | ICD-10-CM | POA: Diagnosis not present

## 2016-04-22 ENCOUNTER — Inpatient Hospital Stay: Admission: RE | Admit: 2016-04-22 | Payer: Self-pay | Source: Ambulatory Visit

## 2016-04-27 ENCOUNTER — Other Ambulatory Visit (HOSPITAL_COMMUNITY): Payer: Self-pay | Admitting: Neurosurgery

## 2016-04-27 DIAGNOSIS — M5416 Radiculopathy, lumbar region: Secondary | ICD-10-CM

## 2016-05-08 DIAGNOSIS — D508 Other iron deficiency anemias: Secondary | ICD-10-CM | POA: Diagnosis not present

## 2016-05-08 DIAGNOSIS — E1142 Type 2 diabetes mellitus with diabetic polyneuropathy: Secondary | ICD-10-CM | POA: Diagnosis not present

## 2016-05-08 DIAGNOSIS — R740 Nonspecific elevation of levels of transaminase and lactic acid dehydrogenase [LDH]: Secondary | ICD-10-CM | POA: Diagnosis not present

## 2016-05-08 DIAGNOSIS — E784 Other hyperlipidemia: Secondary | ICD-10-CM | POA: Diagnosis not present

## 2016-05-08 DIAGNOSIS — E668 Other obesity: Secondary | ICD-10-CM | POA: Diagnosis not present

## 2016-05-08 DIAGNOSIS — C73 Malignant neoplasm of thyroid gland: Secondary | ICD-10-CM | POA: Diagnosis not present

## 2016-05-08 DIAGNOSIS — E890 Postprocedural hypothyroidism: Secondary | ICD-10-CM | POA: Diagnosis not present

## 2016-05-08 DIAGNOSIS — S93492A Sprain of other ligament of left ankle, initial encounter: Secondary | ICD-10-CM | POA: Diagnosis not present

## 2016-05-08 DIAGNOSIS — E11621 Type 2 diabetes mellitus with foot ulcer: Secondary | ICD-10-CM | POA: Diagnosis not present

## 2016-05-08 DIAGNOSIS — K760 Fatty (change of) liver, not elsewhere classified: Secondary | ICD-10-CM | POA: Diagnosis not present

## 2016-05-08 DIAGNOSIS — M545 Low back pain: Secondary | ICD-10-CM | POA: Diagnosis not present

## 2016-05-08 DIAGNOSIS — E559 Vitamin D deficiency, unspecified: Secondary | ICD-10-CM | POA: Diagnosis not present

## 2016-05-08 DIAGNOSIS — E1129 Type 2 diabetes mellitus with other diabetic kidney complication: Secondary | ICD-10-CM | POA: Diagnosis not present

## 2016-05-09 ENCOUNTER — Ambulatory Visit (HOSPITAL_COMMUNITY)
Admission: RE | Admit: 2016-05-09 | Discharge: 2016-05-09 | Disposition: A | Discharge status: Home / Self Care | Payer: 59 | Source: Ambulatory Visit | Attending: Neurosurgery | Admitting: Neurosurgery

## 2016-05-09 DIAGNOSIS — M5126 Other intervertebral disc displacement, lumbar region: Secondary | ICD-10-CM | POA: Diagnosis not present

## 2016-05-09 DIAGNOSIS — M5416 Radiculopathy, lumbar region: Secondary | ICD-10-CM | POA: Diagnosis not present

## 2016-05-09 DIAGNOSIS — M4806 Spinal stenosis, lumbar region: Secondary | ICD-10-CM | POA: Diagnosis not present

## 2016-05-09 MED ORDER — GADOBENATE DIMEGLUMINE 529 MG/ML IV SOLN
20.0000 mL | Freq: Once | INTRAVENOUS | Status: AC | PRN
  Administered 2016-05-09: 20 mL via INTRAVENOUS

## 2016-05-14 DIAGNOSIS — Z4789 Encounter for other orthopedic aftercare: Secondary | ICD-10-CM | POA: Diagnosis not present

## 2016-05-14 DIAGNOSIS — M25572 Pain in left ankle and joints of left foot: Secondary | ICD-10-CM | POA: Diagnosis not present

## 2016-05-22 DIAGNOSIS — L300 Nummular dermatitis: Secondary | ICD-10-CM | POA: Diagnosis not present

## 2016-05-22 DIAGNOSIS — L739 Follicular disorder, unspecified: Secondary | ICD-10-CM | POA: Diagnosis not present

## 2016-05-28 DIAGNOSIS — Z1231 Encounter for screening mammogram for malignant neoplasm of breast: Secondary | ICD-10-CM | POA: Diagnosis not present

## 2016-06-04 ENCOUNTER — Encounter: Payer: Self-pay | Admitting: Physical Therapy

## 2016-06-04 ENCOUNTER — Ambulatory Visit: Payer: 59 | Attending: Endocrinology | Admitting: Physical Therapy

## 2016-06-04 DIAGNOSIS — M25672 Stiffness of left ankle, not elsewhere classified: Secondary | ICD-10-CM | POA: Insufficient documentation

## 2016-06-04 DIAGNOSIS — R262 Difficulty in walking, not elsewhere classified: Secondary | ICD-10-CM | POA: Diagnosis not present

## 2016-06-04 DIAGNOSIS — M6281 Muscle weakness (generalized): Secondary | ICD-10-CM | POA: Diagnosis not present

## 2016-06-04 DIAGNOSIS — M25572 Pain in left ankle and joints of left foot: Secondary | ICD-10-CM | POA: Diagnosis not present

## 2016-06-04 DIAGNOSIS — R600 Localized edema: Secondary | ICD-10-CM | POA: Diagnosis not present

## 2016-06-05 ENCOUNTER — Encounter: Payer: Self-pay | Admitting: Pharmacist

## 2016-06-05 ENCOUNTER — Other Ambulatory Visit: Payer: Self-pay | Admitting: Pharmacist

## 2016-06-05 VITALS — BP 119/84 | HR 90

## 2016-06-05 DIAGNOSIS — E118 Type 2 diabetes mellitus with unspecified complications: Secondary | ICD-10-CM

## 2016-06-05 DIAGNOSIS — Z794 Long term (current) use of insulin: Secondary | ICD-10-CM

## 2016-06-05 NOTE — Patient Outreach (Signed)
[name]) Care Management  06/05/2016  [name] December 03, 1966 [id number]   Subjective:  Patient is a 50 yo female with type 2 diabetes who presents today for 3 month follow-up as part of the employer-sponsored [employer] to Wellness program. Current diabetes regimen includes Humulin U-500 via pump (approximately 60 units per day) and Invokamet 50-1000 mg twice daily. Patient also continues on daily ASA, ACEi, and statin. Patient will follow-up with Dr. Forde Dandy for [facility] and diabetes review.  Patient continues to struggle with ankle/foot pain and has been out of work due to physician orders for limited mobility. She continues to have this monitored closely. She will begin physical therapy soon.    Also, she has recently experienced two episodes of what she describes as vertigo/dizziness with nausea/vomitting.  These have resolved quickly and she is monitoring for causes.    Physicians: Doran Durand (ortho), [place] (PCP and endo), [place] (podiatrist), [place] (renal)   Diabetes Assessment:   No changes to diabetes regimen given recent A1c at goal. Patient does maintain good medication compliance and boluses all meals and snacks. Most recent A1c was 6.7% (prev 6.8). Weight has remained unchanged since last visit. Patient did not bring meter today but is currently testing 2-3 times per day on average, more if symptomatic, and also wears a CGM. Hypoglycemia frequency has increased slightly since last visit. She becomes symptomatic with twitching/nervous feeling at glucose of 75 or below.  She tests and corrects with yogurt or juice and also has glucose gels by her bedside in case of emergency.  She believes increase in frequently of hypoglycemic events is due to recently being released to resume physical activity. She continues to struggle with neuropathy in feet along with ankle/foot injury. She is being followed by ortho and podiatry and has been taken out of  work with orders for limited mobility.    Lifestyle Assessment:  Diet - Patient continues to struggle with esophageal issues making diet less than optimal. She continues to eat mainly soft foods and ground meat such as ground beef, chicken, or fish. She cannot tolerate rice and certain breads such as rolls. Patient continues to be very frustrated with dietary/esophageal issues.  She is making efforts to make healthy dietary choices including celery/carrots with PB/cheese for snacks and is reducing crunchy snack foods such as chips.     Exercise - Pt is on walking restrictions due to multiple issues with left foot/ankle. She has now been released for walking around the house and will start physical therapy soon.  At this time she will be switched from boot to brace and will begin working up on the amount of time she can tolerate brace.    Plan and Goals: 1) Continue testing regularly 2) Continue to stay as active as able, attend physical therapy as instructed 3) Continue to work toward a healthy diet as foods are tolerated 4) Follow-up in 3 months on Tuesday Sept 26th @ 10:00 am   Great to see you today!

## 2016-06-05 NOTE — Therapy (Signed)
[place], [place], [address] Phone: [phone]   Fax:  [phone]  Physical Therapy Evaluation  Patient Details  Name: [name] MRN: [id number] Date of Birth: Apr 21, 1966 Referring Provider: Dr Jeannine Boga  Encounter Date: 06/04/2016      PT End of Session - 06/04/16 1722    Visit Number 1   Number of Visits 16   Date for PT Re-Evaluation 07/30/16   Authorization Type [facility] UMR    PT Start Time 0800   PT Stop Time 0853   PT Time Calculation (min) 53 min   Activity Tolerance Patient tolerated treatment well   Behavior During Therapy [facility] for tasks assessed/performed      Past Medical History  Diagnosis Date  . Hypertension   . GERD (gastroesophageal reflux disease)   . Fibromyalgia   . Proteinuria   . PONV (postoperative nausea and vomiting)   . Anginal pain ([place])     chest pain  heart cath done  . History of hiatal hernia   . Kidney stones   . High cholesterol   . Chronic bronchitis ([place])     "get it q yr"  . Hypothyroidism   . Type II diabetes mellitus ([place])   . Iron deficiency anemia     "comes and goes"  . Migraine     "@ least once/month" (02/18/2015)  . History of gout   . Cancer ([place])   . Tachycardia   . Hepatic adenoma     Past Surgical History  Procedure Laterality Date  . Lumbar disc surgery  08/2011  . Colonoscopy    . Esophagogastroduodenoscopy    . Total thyroidectomy  02/18/2015  . Back surgery    . Cystoscopy with ureteroscopy, stone basketry and stent placement  ~ 2006  . Abdominal hysterectomy  ~2012    "lap"  . Cardiac catheterization  04/2009    "clean"  . Laparoscopic cholecystectomy  05/2004  . Thyroidectomy N/A 02/18/2015    Procedure: TOTAL THYROIDECTOMY;  Surgeon: [name], MD;  Location: [facility];  Service: General;  Laterality: N/A;  . Ankle reconstruction Left 03/29/2016    Procedure: LEFT ANKLE LATERAL LIGAMENT RECONSTRUCTION AND PERONEAL  TENDON REPAIR OR TENOLYSIS;  Surgeon: Wylene Simmer, MD;  Location: [facility];  Service: Orthopedics;  Laterality: Left;    There were no vitals filed for this visit.       Subjective Assessment - 06/04/16 1206    Subjective Patient had a left ankle reconstruction on 03/29/2016. She had a long histroy of left ankle problems. Since that point she has been in a boot. Her pain is undercontrol.    Pertinent History Ankle surgery 03/29/2016, Fibromyalgia, Tachycardia,    Limitations Standing;Walking;House hold activities   How long can you stand comfortably? > 30 minin the brace anfd the shoe    How long can you walk comfortably? with a cane limited community distances    Diagnostic tests Nothing post-op   Patient Stated Goals Patient would like to be able to walk with less pain    Currently in Pain? Yes   Pain Score 8    Pain Location Ankle   Pain Orientation Left   Pain Descriptors / Indicators Throbbing   Pain Type Chronic pain   Pain Onset More than a month ago   Pain Frequency Constant   Aggravating Factors  standin, walking    Pain Relieving Factors rest, elevation    Effect of Pain on  Daily Activities unable to work/ Unable to walk community distances             [facility] PT Assessment - 06/05/16 0001    Assessment   Medical Diagnosis Left ankle reconstruction    Referring Provider Dr Jeannine Boga   Onset Date/Surgical Date 03/29/16   Hand Dominance Right   Next MD Visit 1 month    Prior Therapy yes prior to surgery    Precautions   Precautions None   Required Braces or Orthoses Other Brace/Splint   Other Brace/Splint CAM walker boot    Restrictions   Weight Bearing Restrictions Yes   LLE Weight Bearing Weight bearing as tolerated   Home Environment   Additional Comments Patient has a ramp into her house; A step into [place] door from [place].    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    Cognition   Overall Cognitive Status Within Functional Limits for tasks assessed   Observation/Other Assessments   Observations Well healed surgical scars with no apparent adhsions    Observation/Other Assessments-Edema    Edema Figure 8   Figure 8 Edema   Figure 8 - Right  44.5 cm   Figure 8 - Left  42cm    Sensation   Additional Comments [place] reports some numbness in her left foot    AROM   Right Ankle Dorsiflexion -4   Right Ankle Plantar Flexion 35   Right Ankle Inversion 13   Right Ankle Eversion 8   Left Ankle Dorsiflexion 15   Left Ankle Plantar Flexion 50   Left Ankle Inversion 35   Left Ankle Eversion 20   PROM   Right Ankle Dorsiflexion 0   Right Ankle Plantar Flexion 38   Right Ankle Inversion 15   Right Ankle Eversion 11   Left Ankle Dorsiflexion 22   Left Ankle Plantar Flexion 55   Left Ankle Inversion 35   Left Ankle Eversion 20   Strength   Right Ankle Dorsiflexion 4/5   Right Ankle Plantar Flexion 1/5   Right Ankle Inversion 2/5   Right Ankle Eversion 2/5   Left Ankle Dorsiflexion 5/5   Left Ankle Plantar Flexion 5/5   Left Ankle Inversion 5/5   Left Ankle Eversion 5/5   Palpation   Palpation comment No significant pain in the right ankle    Ambulation/Gait   Gait Comments decreased right single leg stance time; lateral pertabation with gait; Uses a single point cane;    High Level Balance   High Level Balance Comments unable to perfrom single leg stance on the right                    [facility] Adult PT Treatment/Exercise - 06/05/16 0001    Manual Therapy   Manual therapy comments manual therapy to improve dorsi flexion;    Ankle Exercises: Stretches   Gastroc Stretch Limitations seated with strap 2x30sec   Ankle Exercises: Seated   Ankle Circles/Pumps Limitations ankle punp 2x10    Towel Inversion/Eversion Limitations x10 each                 PT Education - 06/04/16 1721    Education provided Yes   Education  Details Patient educated on HEP and the improtance of improving dorsiflexion    Person(s) Educated Patient   Methods Explanation;Demonstration   Comprehension Verbalized understanding;Returned demonstration;Verbal  cues required          PT Short Term Goals - 06/04/16 2016    PT SHORT TERM GOAL #1   Title Patients left figure eight edema will be > 1cm different then her right    Time 4   Period Weeks   Status New   PT SHORT TERM GOAL #2   Title Patient will demsotrate a 5 cm single leg stance time   Time 4   Period Weeks   Status New   PT SHORT TERM GOAL #3   Title Patient will ambulate 300' w/ no device and no walking boot    Time 4   Period Weeks   Status New   PT SHORT TERM GOAL #4   Title Patient will demostrate 4/5 gross left andkle strength    Time 4   Period Weeks   Status New   PT SHORT TERM GOAL #5   Title Patient will be I w./ HEP for strength and ROM    Time 4   Period Weeks   Status New           PT Long Term Goals - 06/04/16 2019    PT LONG TERM GOAL #1   Title Patient will ambualte 3000' with no significant pain in order to go shopping    Time 8   Period Weeks   Status New   PT LONG TERM GOAL #2   Title Patient will stand for 1 hour without increased pin in order to return to work    Time 8   Period Weeks   Status New   PT LONG TERM GOAL #3   Title Patient will go up/down 8 steps with reciprocol gait pattern.    Time 8   Period Weeks   Status New               Plan - 06/04/16 2003    Clinical Impression Statement Patient is a 50 year old female S/P Left ankle reconstruction on 03/29/16. She is currently weight bearing as tolerated. She is walking in a boot and  using a cane. She presents with decreased ankle strength and ROM. 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 She was seen for a low complexity evaluation. She would benefit from furter skilled therapy.    Rehab Potential Good   PT Frequency 2x / week   PT Duration 8 weeks   PT  Treatment/Interventions ADLs/Self Care Home Management;Cryotherapy;Electrical Stimulation;Moist Heat;Therapeutic exercise;Therapeutic activities;Functional mobility training;Stair training;Gait training;Ultrasound;Balance training;Patient/family education;Manual techniques;Passive range of motion;Scar mobilization;Vasopneumatic Device   PT Next Visit Plan continue with manual therapy to improve DF; consider seated heel rasies, Baps board, standing march, standing weight shift, ABC's, consider    PT Home Exercise Plan 4 way anskle ROM, gastroc stretch;    Consulted and Agree with Plan of Care Patient      Patient will benefit from skilled therapeutic intervention in order to improve the following deficits and impairments:  Abnormal gait, Decreased activity tolerance, Decreased balance, Decreased mobility, Decreased knowledge of use of DME, Decreased endurance, Decreased coordination, Decreased skin integrity, Decreased strength, Impaired sensation, Impaired perceived functional ability, Pain  Visit Diagnosis: Pain in left ankle and joints of left foot - Plan: PT plan of care cert/re-cert  Difficulty in walking, not elsewhere classified - Plan: PT plan of care cert/re-cert  Localized edema - Plan: PT plan of care cert/re-cert  Stiffness of left ankle, not elsewhere classified - Plan: PT plan of  care cert/re-cert  Muscle weakness (generalized) - Plan: PT plan of care cert/re-cert     Problem List Patient Active Problem List   Diagnosis Date Noted  . Cellulitis of toe of left foot 03/15/2016  . Toe ulcer ([place]) 03/15/2016  . Neoplasm of uncertain behavior of thyroid gland, right lobe 02/18/2015  . DIABETES MELLITUS, TYPE II 04/14/2009  . DIABETES MELLITUS, WITH RENAL COMPLICATIONS [id number]  . HYPERLIPIDEMIA 04/14/2009  . OBESITY 04/14/2009  . ANXIETY DEPRESSION 04/14/2009  . HYPERTENSION 04/14/2009  . COPD 04/14/2009  . CHEST PAIN 04/14/2009  . TACHYCARDIA, HX OF 04/14/2009     Carney Living PT DPT  06/05/2016, 9:21 AM  [place], [place], [address] Phone: [phone]   Fax:  [phone]  Name: DRUCIE SCALLON MRN: [id number] Date of Birth: 1966-08-11

## 2016-06-07 DIAGNOSIS — R928 Other abnormal and inconclusive findings on diagnostic imaging of breast: Secondary | ICD-10-CM | POA: Diagnosis not present

## 2016-06-08 ENCOUNTER — Ambulatory Visit (INDEPENDENT_AMBULATORY_CARE_PROVIDER_SITE_OTHER): Payer: 59 | Admitting: Podiatry

## 2016-06-08 ENCOUNTER — Encounter: Payer: Self-pay | Admitting: Podiatry

## 2016-06-08 ENCOUNTER — Ambulatory Visit: Payer: 59 | Admitting: Physical Therapy

## 2016-06-08 VITALS — BP 121/72 | HR 99 | Resp 16

## 2016-06-08 DIAGNOSIS — R600 Localized edema: Secondary | ICD-10-CM

## 2016-06-08 DIAGNOSIS — R262 Difficulty in walking, not elsewhere classified: Secondary | ICD-10-CM | POA: Diagnosis not present

## 2016-06-08 DIAGNOSIS — L89891 Pressure ulcer of other site, stage 1: Secondary | ICD-10-CM | POA: Diagnosis not present

## 2016-06-08 DIAGNOSIS — E108 Type 1 diabetes mellitus with unspecified complications: Secondary | ICD-10-CM | POA: Diagnosis not present

## 2016-06-08 DIAGNOSIS — M25572 Pain in left ankle and joints of left foot: Secondary | ICD-10-CM

## 2016-06-08 DIAGNOSIS — M6281 Muscle weakness (generalized): Secondary | ICD-10-CM

## 2016-06-08 DIAGNOSIS — L03116 Cellulitis of left lower limb: Secondary | ICD-10-CM

## 2016-06-08 DIAGNOSIS — L97521 Non-pressure chronic ulcer of other part of left foot limited to breakdown of skin: Secondary | ICD-10-CM

## 2016-06-08 DIAGNOSIS — M25672 Stiffness of left ankle, not elsewhere classified: Secondary | ICD-10-CM

## 2016-06-08 DIAGNOSIS — L84 Corns and callosities: Secondary | ICD-10-CM | POA: Diagnosis not present

## 2016-06-08 MED ORDER — CLINDAMYCIN HCL 300 MG PO CAPS: 300.0000 mg | ORAL_CAPSULE | Freq: Three times a day (TID) | ORAL | Status: DC

## 2016-06-08 NOTE — Therapy (Signed)
[place], [place], [address] Phone: [phone]   Fax:  [phone]  Physical Therapy Treatment  Patient Details  Name: [name] MRN: [id number] Date of Birth: [birth year] Referring Provider: Dr Jeannine Boga  Encounter Date: 06/08/2016      PT End of Session - 06/08/16 1154    Visit Number 2   Number of Visits 16   Date for PT Re-Evaluation 07/30/16   Authorization Type [facility] UMR    PT Start Time 1100   PT Stop Time 1145   PT Time Calculation (min) 45 min      Past Medical History  Diagnosis Date  . Hypertension   . GERD (gastroesophageal reflux disease)   . Fibromyalgia   . Proteinuria   . PONV (postoperative nausea and vomiting)   . Anginal pain ([place])     chest pain  heart cath done  . History of hiatal hernia   . Kidney stones   . High cholesterol   . Chronic bronchitis ([place])     "get it q yr"  . Hypothyroidism   . Type II diabetes mellitus ([place])   . Iron deficiency anemia     "comes and goes"  . Migraine     "@ least once/month" (02/18/2015)  . History of gout   . Cancer ([place])   . Tachycardia   . Hepatic adenoma     Past Surgical History  Procedure Laterality Date  . Lumbar disc surgery  08/2011  . Colonoscopy    . Esophagogastroduodenoscopy    . Total thyroidectomy  02/18/2015  . Back surgery    . Cystoscopy with ureteroscopy, stone basketry and stent placement  ~ 2006  . Abdominal hysterectomy  ~2012    "lap"  . Cardiac catheterization  04/2009    "clean"  . Laparoscopic cholecystectomy  05/2004  . Thyroidectomy N/A 02/18/2015    Procedure: TOTAL THYROIDECTOMY;  Surgeon: [name], MD;  Location: [facility];  Service: General;  Laterality: N/A;  . Ankle reconstruction Left 03/29/2016    Procedure: LEFT ANKLE LATERAL LIGAMENT RECONSTRUCTION AND PERONEAL TENDON REPAIR OR TENOLYSIS;  Surgeon: Wylene Simmer, MD;  Location: [facility];  Service: Orthopedics;  Laterality: Left;     There were no vitals filed for this visit.      Subjective Assessment - 06/08/16 1252    Subjective I have been doing my exercise   Currently in Pain? --  did not rate                                 PT Education - 06/08/16 1226    Education provided Yes   Education Details Seated ankle exercises   Person(s) Educated Patient   Methods Explanation;Handout   Comprehension Verbalized understanding          PT Short Term Goals - 06/04/16 2016    PT SHORT TERM GOAL #1   Title Patients left figure eight edema will be > 1cm different then her right    Time 4   Period Weeks   Status New   PT SHORT TERM GOAL #2   Title Patient will demsotrate a 5 cm single leg stance time   Time 4   Period Weeks   Status New   PT SHORT TERM GOAL #3   Title Patient will ambulate 300' w/ no device and no walking boot    Time 4  Period Weeks   Status New   PT SHORT TERM GOAL #4   Title Patient will demostrate 4/5 gross left andkle strength    Time 4   Period Weeks   Status New   PT SHORT TERM GOAL #5   Title Patient will be I w./ HEP for strength and ROM    Time 4   Period Weeks   Status New           PT Long Term Goals - 06/04/16 2019    PT LONG TERM GOAL #1   Title Patient will ambualte 3000' with no significant pain in order to go shopping    Time 8   Period Weeks   Status New   PT LONG TERM GOAL #2   Title Patient will stand for 1 hour without increased pin in order to return to work    Time 8   Period Weeks   Status New   PT LONG TERM GOAL #3   Title Patient will go up/down 8 steps with reciprocol gait pattern.    Time 8   Period Weeks   Status New               Plan - 06/08/16 1249    Clinical Impression Statement Pt plans to begin walking in chest high water. Asked her to wear her ASO or boot until she is pool side. There will be steps with railing to enter the pool. We began yellow theraband for DF and PF as well as Toe Yoga  and towel exercises for ankle inversion and eversion. Updated HEP. No increased pain.    PT Next Visit Plan continue with manual therapy to improve DF; consider seated heel rasies, Baps board, standing march, standing weight shift, ABC's, review HEP      Patient will benefit from skilled therapeutic intervention in order to improve the following deficits and impairments:  Abnormal gait, Decreased activity tolerance, Decreased balance, Decreased mobility, Decreased knowledge of use of DME, Decreased endurance, Decreased coordination, Decreased skin integrity, Decreased strength, Impaired sensation, Impaired perceived functional ability, Pain  Visit Diagnosis: Pain in left ankle and joints of left foot  Difficulty in walking, not elsewhere classified  Localized edema  Stiffness of left ankle, not elsewhere classified  Muscle weakness (generalized)     Problem List Patient Active Problem List   Diagnosis Date Noted  . Cellulitis of toe of left foot 03/15/2016  . Toe ulcer ([place]) 03/15/2016  . Neoplasm of uncertain behavior of thyroid gland, right lobe 02/18/2015  . DIABETES MELLITUS, TYPE II 04/14/2009  . DIABETES MELLITUS, WITH RENAL COMPLICATIONS [id number]  . HYPERLIPIDEMIA 04/14/2009  . OBESITY 04/14/2009  . ANXIETY DEPRESSION 04/14/2009  . HYPERTENSION 04/14/2009  . COPD 04/14/2009  . CHEST PAIN 04/14/2009  . TACHYCARDIA, HX OF 04/14/2009    Dorene Ar, PTA 06/08/2016, 12:53 PM  [facility] [address] [place], [place], [address] Phone: [phone]   Fax:  [phone]  Name: APREL GLASBY MRN: [id number] Date of Birth: 1966/10/10

## 2016-06-11 DIAGNOSIS — N181 Chronic kidney disease, stage 1: Secondary | ICD-10-CM | POA: Diagnosis not present

## 2016-06-11 DIAGNOSIS — R000 Tachycardia, unspecified: Secondary | ICD-10-CM | POA: Diagnosis not present

## 2016-06-11 DIAGNOSIS — R809 Proteinuria, unspecified: Secondary | ICD-10-CM | POA: Diagnosis not present

## 2016-06-11 DIAGNOSIS — E118 Type 2 diabetes mellitus with unspecified complications: Secondary | ICD-10-CM | POA: Diagnosis not present

## 2016-06-11 DIAGNOSIS — E669 Obesity, unspecified: Secondary | ICD-10-CM | POA: Diagnosis not present

## 2016-06-11 DIAGNOSIS — I129 Hypertensive chronic kidney disease with stage 1 through stage 4 chronic kidney disease, or unspecified chronic kidney disease: Secondary | ICD-10-CM | POA: Diagnosis not present

## 2016-06-11 DIAGNOSIS — E785 Hyperlipidemia, unspecified: Secondary | ICD-10-CM | POA: Diagnosis not present

## 2016-06-11 DIAGNOSIS — D134 Benign neoplasm of liver: Secondary | ICD-10-CM | POA: Diagnosis not present

## 2016-06-11 DIAGNOSIS — J40 Bronchitis, not specified as acute or chronic: Secondary | ICD-10-CM | POA: Diagnosis not present

## 2016-06-14 ENCOUNTER — Ambulatory Visit: Payer: 59 | Attending: Endocrinology | Admitting: Physical Therapy

## 2016-06-14 DIAGNOSIS — M25572 Pain in left ankle and joints of left foot: Secondary | ICD-10-CM | POA: Insufficient documentation

## 2016-06-14 DIAGNOSIS — R262 Difficulty in walking, not elsewhere classified: Secondary | ICD-10-CM | POA: Insufficient documentation

## 2016-06-14 DIAGNOSIS — R1312 Dysphagia, oropharyngeal phase: Secondary | ICD-10-CM | POA: Diagnosis not present

## 2016-06-14 DIAGNOSIS — M6281 Muscle weakness (generalized): Secondary | ICD-10-CM | POA: Diagnosis not present

## 2016-06-14 DIAGNOSIS — M25672 Stiffness of left ankle, not elsewhere classified: Secondary | ICD-10-CM | POA: Insufficient documentation

## 2016-06-14 DIAGNOSIS — R600 Localized edema: Secondary | ICD-10-CM | POA: Diagnosis not present

## 2016-06-14 NOTE — Therapy (Signed)
[place], [place], [address] Phone: [phone]   Fax:  [phone]  Physical Therapy Treatment  Patient Details  Name: [name] MRN: [id number] Date of Birth: [birth year] Referring Provider: Dr Jeannine Boga  Encounter Date: 06/14/2016      PT End of Session - 06/14/16 1026    Visit Number 3   Number of Visits 16   Date for PT Re-Evaluation 07/30/16   Authorization Type [facility] U[facility]    PT Start Time 0845   PT Stop Time 0934   PT Time Calculation (min) 49 min   Activity Tolerance Patient tolerated treatment well   Behavior During Therapy [facility] for tasks assessed/performed      Past Medical History  Diagnosis Date  . Hypertension   . GERD (gastroesophageal reflux disease)   . Fibromyalgia   . Proteinuria   . PONV (postoperative nausea and vomiting)   . Anginal pain ([place])     chest pain  heart cath done  . History of hiatal hernia   . Kidney stones   . High cholesterol   . Chronic bronchitis ([place])     "get it q yr"  . Hypothyroidism   . Type II diabetes mellitus ([place])   . Iron deficiency anemia     "comes and goes"  . Migraine     "@ least once/month" (02/18/2015)  . History of gout   . Cancer ([place])   . Tachycardia   . Hepatic adenoma     Past Surgical History  Procedure Laterality Date  . Lumbar disc surgery  08/2011  . Colonoscopy    . Esophagogastroduodenoscopy    . Total thyroidectomy  02/18/2015  . Back surgery    . Cystoscopy with ureteroscopy, stone basketry and stent placement  ~ 2006  . Abdominal hysterectomy  ~2012    "lap"  . Cardiac catheterization  04/2009    "clean"  . Laparoscopic cholecystectomy  05/2004  . Thyroidectomy N/A 02/18/2015    Procedure: TOTAL THYROIDECTOMY;  Surgeon: [name], MD;  Location: [place];  Service: General;  Laterality: N/A;  . Ankle reconstruction Left 03/29/2016    Procedure: LEFT ANKLE LATERAL LIGAMENT RECONSTRUCTION AND PERONEAL  TENDON REPAIR OR TENOLYSIS;  Surgeon: Wylene Simmer, MD;  Location: [place];  Service: Orthopedics;  Laterality: Left;    There were no vitals filed for this visit.      Subjective Assessment - 06/14/16 1021    Subjective Patient went to the MD who wants her to continue what she has been doing.    Pertinent History Ankle surgery 03/29/2016, Fibromyalgia, Tachycardia,    Limitations Standing;Walking;House hold activities   How long can you stand comfortably? > 30 minin the brace anfd the shoe    How long can you walk comfortably? with a cane limited community distances    Diagnostic tests Nothing post-op   Patient Stated Goals Patient would like to be able to walk with less pain    Pain Score 8    Pain Location Ankle   Pain Orientation Left   Pain Descriptors / Indicators Throbbing   Pain Type Chronic pain   Pain Onset More than a month ago   Pain Frequency Constant   Aggravating Factors  standing, walking    Pain Relieving Factors rest, elevation    Effect of Pain on Daily Activities unable to work/ unable to walk community distances.    Multiple Pain Sites No  [place] Adult PT Treatment/Exercise - 06/14/16 0001    Lumbar Exercises: Seated   LAQ on Chair Limitations 2x10   Hip Flexion on Ball Limitations 2x10   Other Seated Lumbar Exercises seated clamshell 2x10 red    Manual Therapy   Manual therapy comments manual therapy to improve dorsi flexion; Soft tissue mobilization and edema massage to decrease pain and increase mobility.    Ankle Exercises: Seated   Other Seated Ankle Exercises seated heel raise 2x10; seeted toe raise 2x10; windshield wipers 2x10;                 PT Education - 06/14/16 1025    Education provided Yes   Education Details improtance of whole leg strengthening. Patient given standing left leg standing hip exercises to work on in the pool    Person(s) Educated Patient   Methods Explanation;Handout    Comprehension Verbalized understanding          PT Short Term Goals - 06/14/16 1038    PT SHORT TERM GOAL #1   Title Patients left figure eight edema will be > 1cm different then her right    Time 4   Period Weeks   Status On-going   PT SHORT TERM GOAL #2   Title Patient will demsotrate a 5 cm single leg stance time   Time 4   Period Weeks   Status On-going   PT SHORT TERM GOAL #3   Title Patient will ambulate 300' w/ no device and no walking boot    Time 4   Period Weeks   Status On-going   PT SHORT TERM GOAL #4   Title Patient will demostrate 4/5 gross left andkle strength    Time 4   Status On-going   PT SHORT TERM GOAL #5   Title Patient will be I w./ HEP for strength and ROM    Period Weeks   Status On-going           PT Long Term Goals - 06/04/16 2019    PT LONG TERM GOAL #1   Title Patient will ambualte 3000' with no significant pain in order to go shopping    Time 8   Period Weeks   Status New   PT LONG TERM GOAL #2   Title Patient will stand for 1 hour without increased pin in order to return to work    Time 8   Period Weeks   Status New   PT LONG TERM GOAL #3   Title Patient will go up/down 8 steps with reciprocol gait pattern.    Time 8   Period Weeks   Status New               Plan - 06/14/16 1026    Clinical Impression Statement Patient tolerated treatment well but needs cuing to stay on task and perfrom exercises. Per visual inpection her DF has improved. She was given L LE stregthening to work on at home and in the pool [phone]   Rehab Potential Good   PT Frequency 2x / week   PT Duration 8 weeks   PT Treatment/Interventions ADLs/Self Care Home Management;Cryotherapy;Electrical Stimulation;Moist Heat;Therapeutic exercise;Therapeutic activities;Functional mobility training;Stair training;Gait training;Ultrasound;Balance training;Patient/family education;Manual techniques;Passive range of motion;Scar mobilization;Vasopneumatic Device   PT  Next Visit Plan continue with manual therapy to improve DF;, Baps board, standing march, standing weight shift, ABC's, review HEP Continue with seated exercises    PT Home Exercise Plan 4 way anskle ROM, gastroc stretch;  Consulted and Agree with Plan of Care Patient      Patient will benefit from skilled therapeutic intervention in order to improve the following deficits and impairments:  Abnormal gait, Decreased activity tolerance, Decreased balance, Decreased mobility, Decreased knowledge of use of DME, Decreased endurance, Decreased coordination, Decreased skin integrity, Decreased strength, Impaired sensation, Impaired perceived functional ability, Pain  Visit Diagnosis: Pain in left ankle and joints of left foot  Difficulty in walking, not elsewhere classified  Localized edema  Stiffness of left ankle, not elsewhere classified     Problem List Patient Active Problem List   Diagnosis Date Noted  . Cellulitis of toe of left foot 03/15/2016  . Toe ulcer ([place]) 03/15/2016  . Neoplasm of uncertain behavior of thyroid gland, right lobe 02/18/2015  . DIABETES MELLITUS, TYPE II 04/14/2009  . DIABETES MELLITUS, WITH RENAL COMPLICATIONS [id number]  . HYPERLIPIDEMIA 04/14/2009  . OBESITY 04/14/2009  . ANXIETY DEPRESSION 04/14/2009  . HYPERTENSION 04/14/2009  . COPD 04/14/2009  . CHEST PAIN 04/14/2009  . TACHYCARDIA, HX OF 04/14/2009    Carney Living PT DPT  06/14/2016, 10:39 AM  [facility] [address] [place], [place], [address] Phone: [phone]   Fax:  [phone]  Name: [name] MRN: [id number] Date of Birth: 1966-08-30

## 2016-06-15 ENCOUNTER — Other Ambulatory Visit (HOSPITAL_COMMUNITY): Payer: Self-pay | Admitting: Gastroenterology

## 2016-06-15 DIAGNOSIS — R1310 Dysphagia, unspecified: Secondary | ICD-10-CM

## 2016-06-18 NOTE — Progress Notes (Signed)
Patient ID: [name], female   DOB: 03/17/1966, 50 y.o.   MRN: [id number]  Subjective: 50 year old female presents today as a walk-in patient to have her left big toe looked at. She states that she started to have some pain to her toe she's noticed black spot where she had the wound. Denies any drainage or pus. She has a has noticed that her left leg is become warm and red. She did recently have her ankle surgery performed by Dr. Doran Durand and she states this is doing well and she is starting therapy.Denies any systemic complaints such as fevers, chills, nausea, vomiting. No acute changes since last appointment, and no other complaints at this time.   Objective: AAO x3, NAD DP/PT pulses palpable bilaterally, CRT less than 3 seconds Incision from her surgery is healed with a scar. There is no tenderness to palpation. Along the left plantar hallux is a scab overlying with the wound was previously. Upon debridement there is no open sores identified this time however it is pre-ulcerative. There is no drainage or pus. No surrounding erythema. No fluctuance or crepitus or malodor. There is a faint amount of erythema to the anterior portion of the leg and mild increase in warmth. There is no open sores or any drainage. There is no pain with calf compression. No induration. No areas of pinpoint bony tenderness or pain with vibratory sensation. MMT 5/5, ROM WNL. No edema, erythema, increase in warmth to bilateral lower extremities.  No open lesions or pre-ulcerative lesions.  No pain with calf compression, swelling, warmth, erythema  Assessment: Left leg cellulitis; pre-ulcerative lesion to the hallux  Plan: -All treatment options discussed with the patient including all alternatives, risks, complications.  -Lesion was debrided without complications or bleeding. Continue offloading. -Will start clindamycin for cellulitis. If any worsening to go to the ER -Follow-up as scheduled -Patient  encouraged to call the office with any questions, concerns, change in symptoms.   Celesta Gentile, DPM

## 2016-06-19 ENCOUNTER — Ambulatory Visit: Payer: 59 | Admitting: Physical Therapy

## 2016-06-19 DIAGNOSIS — M25672 Stiffness of left ankle, not elsewhere classified: Secondary | ICD-10-CM

## 2016-06-19 DIAGNOSIS — M6281 Muscle weakness (generalized): Secondary | ICD-10-CM

## 2016-06-19 DIAGNOSIS — R262 Difficulty in walking, not elsewhere classified: Secondary | ICD-10-CM | POA: Diagnosis not present

## 2016-06-19 DIAGNOSIS — M5416 Radiculopathy, lumbar region: Secondary | ICD-10-CM | POA: Diagnosis not present

## 2016-06-19 DIAGNOSIS — R600 Localized edema: Secondary | ICD-10-CM

## 2016-06-19 DIAGNOSIS — M25572 Pain in left ankle and joints of left foot: Secondary | ICD-10-CM | POA: Diagnosis not present

## 2016-06-19 NOTE — Therapy (Signed)
[place], [place], [address] Phone: [phone]   Fax:  [phone]  Physical Therapy Treatment  Patient Details  Name: [name] MRN: [id number] Date of Birth: 06/17/1966 Referring Provider: Dr Jeannine Boga  Encounter Date: 06/19/2016      PT End of Session - 06/19/16 0937    Visit Number 4   Number of Visits 16   Date for PT Re-Evaluation 07/30/16   Authorization Type [facility] U[facility]    PT Start Time 0845   PT Stop Time 0940   PT Time Calculation (min) 55 min      Past Medical History  Diagnosis Date  . Hypertension   . GERD (gastroesophageal reflux disease)   . Fibromyalgia   . Proteinuria   . PONV (postoperative nausea and vomiting)   . Anginal pain ([place])     chest pain  heart cath done  . History of hiatal hernia   . Kidney stones   . High cholesterol   . Chronic bronchitis ([place])     "get it q yr"  . Hypothyroidism   . Type II diabetes mellitus ([place])   . Iron deficiency anemia     "comes and goes"  . Migraine     "@ least once/month" (02/18/2015)  . History of gout   . Cancer ([place])   . Tachycardia   . Hepatic adenoma     Past Surgical History  Procedure Laterality Date  . Lumbar disc surgery  08/2011  . Colonoscopy    . Esophagogastroduodenoscopy    . Total thyroidectomy  02/18/2015  . Back surgery    . Cystoscopy with ureteroscopy, stone basketry and stent placement  ~ 2006  . Abdominal hysterectomy  ~2012    "lap"  . Cardiac catheterization  04/2009    "clean"  . Laparoscopic cholecystectomy  05/2004  . Thyroidectomy N/A 02/18/2015    Procedure: TOTAL THYROIDECTOMY;  Surgeon: [name], MD;  Location: [place];  Service: General;  Laterality: N/A;  . Ankle reconstruction Left 03/29/2016    Procedure: LEFT ANKLE LATERAL LIGAMENT RECONSTRUCTION AND PERONEAL TENDON REPAIR OR TENOLYSIS;  Surgeon: Wylene Simmer, MD;  Location: [place];  Service: Orthopedics;  Laterality: Left;     There were no vitals filed for this visit.                       [place] Adult PT Treatment/Exercise - 06/19/16 0001    Ambulation/Gait   Ambulation/Gait Yes   Ambulation/Gait Assistance 6: Modified independent (Device/Increase time);1: +1 Total assist   Ambulation Distance (Feet) 100 Feet   Assistive device Straight cane   Gait Pattern Antalgic;Decreased dorsiflexion - left;Decreased stance time - left   Ambulation Surface Level;Indoor   Pre-Gait Activities staggered stand weight shifting forward and backward focusing on heel strike and toe off   Gait Comments decreased gait deviations with cues for heel strike and toe off.    Ankle Exercises: Aerobic   Stationary Bike Nustep L5 LE only x 5 minutes   Ankle Exercises: Standing   Other Standing Ankle Exercises standing 3 way hip left x 10 x 2 each , h/s curl x 10   Other Standing Ankle Exercises weight shifting 10 sec x 10   Ankle Exercises: Seated   BAPS Level 2;Sitting  DF/PF, Pro/Sup, circles each way                PT Education - 06/19/16 0930  Education provided Yes   Education Details staggered stand weight shifting   Person(s) Educated Patient   Methods Explanation;Handout   Comprehension Verbalized understanding          PT Short Term Goals - 06/14/16 1038    PT SHORT TERM GOAL #1   Title Patients left figure eight edema will be > 1cm different then her right    Time 4   Period Weeks   Status On-going   PT SHORT TERM GOAL #2   Title Patient will demsotrate a 5 cm single leg stance time   Time 4   Period Weeks   Status On-going   PT SHORT TERM GOAL #3   Title Patient will ambulate 300' w/ no device and no walking boot    Time 4   Period Weeks   Status On-going   PT SHORT TERM GOAL #4   Title Patient will demostrate 4/5 gross left andkle strength    Time 4   Status On-going   PT SHORT TERM GOAL #5   Title Patient will be I w./ HEP for strength and ROM    Period Weeks    Status On-going           PT Long Term Goals - 06/04/16 2019    PT LONG TERM GOAL #1   Title Patient will ambualte 3000' with no significant pain in order to go shopping    Time 8   Period Weeks   Status New   PT LONG TERM GOAL #2   Title Patient will stand for 1 hour without increased pin in order to return to work    Time 8   Period Weeks   Status New   PT LONG TERM GOAL #3   Title Patient will go up/down 8 steps with reciprocol gait pattern.    Time 8   Period Weeks   Status New               Plan - 06/19/16 0934    Clinical Impression Statement Pt reports she walked in the pool and did well. Began Nustep which helped reduce ankle stiffness and pt demonstrated improved gait afterward. We worked on pre-gait exercises and added to HEP. Also encouraged no cam boot as tolerated. Gait training with cues for heel strike and toe off improved pt's gait mechanics in clinic. Increased standing exercises today with good tolerance.    PT Next Visit Plan continue with manual therapy to improve DF;, Baps board, standing march, standing weight shift, ABC's, review HEP ; standing exercises as tolerated, rocker board      Patient will benefit from skilled therapeutic intervention in order to improve the following deficits and impairments:  Abnormal gait, Decreased activity tolerance, Decreased balance, Decreased mobility, Decreased knowledge of use of DME, Decreased endurance, Decreased coordination, Decreased skin integrity, Decreased strength, Impaired sensation, Impaired perceived functional ability, Pain  Visit Diagnosis: Pain in left ankle and joints of left foot  Difficulty in walking, not elsewhere classified  Localized edema  Stiffness of left ankle, not elsewhere classified  Muscle weakness (generalized)     Problem List Patient Active Problem List   Diagnosis Date Noted  . Cellulitis of toe of left foot 03/15/2016  . Toe ulcer ([place]) 03/15/2016  . Neoplasm of  uncertain behavior of thyroid gland, right lobe 02/18/2015  . DIABETES MELLITUS, TYPE II 04/14/2009  . DIABETES MELLITUS, WITH RENAL COMPLICATIONS [id number]  . HYPERLIPIDEMIA 04/14/2009  . OBESITY 04/14/2009  . ANXIETY DEPRESSION  04/14/2009  . HYPERTENSION 04/14/2009  . COPD 04/14/2009  . CHEST PAIN 04/14/2009  . TACHYCARDIA, HX OF 04/14/2009    Dorene Ar, PTA 06/19/2016, 9:38 AM  [place], [place], [address] Phone: [phone]   Fax:  [phone]  Name: AIRIS AVINA MRN: [id number] Date of Birth: 1966-05-25

## 2016-06-21 ENCOUNTER — Ambulatory Visit: Payer: 59 | Admitting: Physical Therapy

## 2016-06-21 DIAGNOSIS — R262 Difficulty in walking, not elsewhere classified: Secondary | ICD-10-CM

## 2016-06-21 DIAGNOSIS — M6281 Muscle weakness (generalized): Secondary | ICD-10-CM | POA: Diagnosis not present

## 2016-06-21 DIAGNOSIS — M25572 Pain in left ankle and joints of left foot: Secondary | ICD-10-CM

## 2016-06-21 DIAGNOSIS — R600 Localized edema: Secondary | ICD-10-CM | POA: Diagnosis not present

## 2016-06-21 DIAGNOSIS — M25672 Stiffness of left ankle, not elsewhere classified: Secondary | ICD-10-CM

## 2016-06-25 NOTE — Therapy (Signed)
[place], [place], [address] Phone: [phone]   Fax:  [phone]  Physical Therapy Treatment  Patient Details  Name: [name] MRN: [id number] Date of Birth: [birth year] Referring Provider: Dr Jeannine Boga  Encounter Date: 06/21/2016    Past Medical History  Diagnosis Date  . Hypertension   . GERD (gastroesophageal reflux disease)   . Fibromyalgia   . Proteinuria   . PONV (postoperative nausea and vomiting)   . Anginal pain ([place])     chest pain  heart cath done  . History of hiatal hernia   . Kidney stones   . High cholesterol   . Chronic bronchitis ([place])     "get it q yr"  . Hypothyroidism   . Type II diabetes mellitus ([place])   . Iron deficiency anemia     "comes and goes"  . Migraine     "@ least once/month" (02/18/2015)  . History of gout   . Cancer ([place])   . Tachycardia   . Hepatic adenoma     Past Surgical History  Procedure Laterality Date  . Lumbar disc surgery  08/2011  . Colonoscopy    . Esophagogastroduodenoscopy    . Total thyroidectomy  02/18/2015  . Back surgery    . Cystoscopy with ureteroscopy, stone basketry and stent placement  ~ 2006  . Abdominal hysterectomy  ~2012    "lap"  . Cardiac catheterization  04/2009    "clean"  . Laparoscopic cholecystectomy  05/2004  . Thyroidectomy N/A 02/18/2015    Procedure: TOTAL THYROIDECTOMY;  Surgeon: [name], MD;  Location: [place];  Service: General;  Laterality: N/A;  . Ankle reconstruction Left 03/29/2016    Procedure: LEFT ANKLE LATERAL LIGAMENT RECONSTRUCTION AND PERONEAL TENDON REPAIR OR TENOLYSIS;  Surgeon: Wylene Simmer, MD;  Location: [place];  Service: Orthopedics;  Laterality: Left;    There were no vitals filed for this visit.      Subjective Assessment - 06/25/16 1147    Subjective Patient reports less stiffness since the last visit. She conmes in without her cane because her cane broke   Pertinent History  Ankle surgery 03/29/2016, Fibromyalgia, Tachycardia,    Limitations Standing;Walking;House hold activities   How long can you stand comfortably? > 30 minin the brace anfd the shoe    How long can you walk comfortably? with a cane limited community distances    Diagnostic tests Nothing post-op   Patient Stated Goals Patient would like to be able to walk with less pain    Pain Onset More than a month ago                         [facility] Adult PT Treatment/Exercise - 06/25/16 0001    Lumbar Exercises: Seated   LAQ on Chair Limitations 2x10   Hip Flexion on Ball Limitations 2x10   Other Seated Lumbar Exercises seated clamshell 2x10 red    Manual Therapy   Manual therapy comments manual therapy to improve dorsi flexion; Soft tissue mobilization and edema massage to decrease pain and increase mobility.    Ankle Exercises: Aerobic   Stationary Bike Nustep L5 LE only x 5 minutes   Ankle Exercises: Seated   BAPS Level 2;Sitting  DF/PF, Pro/Sup, circles each way   Other Seated Ankle Exercises seated heel raise 2x10; seeted toe raise 2x10; windshield wipers 2x10;  PT Short Term Goals - 06/14/16 1038    PT SHORT TERM GOAL #1   Title Patients left figure eight edema will be > 1cm different then her right    Time 4   Period Weeks   Status On-going   PT SHORT TERM GOAL #2   Title Patient will demsotrate a 5 cm single leg stance time   Time 4   Period Weeks   Status On-going   PT SHORT TERM GOAL #3   Title Patient will ambulate 300' w/ no device and no walking boot    Time 4   Period Weeks   Status On-going   PT SHORT TERM GOAL #4   Title Patient will demostrate 4/5 gross left andkle strength    Time 4   Status On-going   PT SHORT TERM GOAL #5   Title Patient will be I w./ HEP for strength and ROM    Period Weeks   Status On-going           PT Long Term Goals - 06/04/16 2019    PT LONG TERM GOAL #1   Title Patient will ambualte 3000'  with no significant pain in order to go shopping    Time 8   Period Weeks   Status New   PT LONG TERM GOAL #2   Title Patient will stand for 1 hour without increased pin in order to return to work    Time 8   Period Weeks   Status New   PT LONG TERM GOAL #3   Title Patient will go up/down 8 steps with reciprocol gait pattern.    Time 8   Period Weeks   Status New               Plan - 06/25/16 1147    Clinical Impression Statement No significant pain increase with treatment. She had an increase in her antalgic gait when she left.    Rehab Potential Good   PT Frequency 2x / week   PT Duration 8 weeks   PT Treatment/Interventions ADLs/Self Care Home Management;Cryotherapy;Electrical Stimulation;Moist Heat;Therapeutic exercise;Therapeutic activities;Functional mobility training;Stair training;Gait training;Ultrasound;Balance training;Patient/family education;Manual techniques;Passive range of motion;Scar mobilization;Vasopneumatic Device   PT Next Visit Plan continue with manual therapy to improve DF;, Baps board, standing march, standing weight shift, ABC's, review HEP ; standing exercises as tolerated, rocker board   PT Home Exercise Plan 4 way ankle ROM, gastroc stretch;    Consulted and Agree with Plan of Care Patient      Patient will benefit from skilled therapeutic intervention in order to improve the following deficits and impairments:  Abnormal gait, Decreased activity tolerance, Decreased balance, Decreased mobility, Decreased knowledge of use of DME, Decreased endurance, Decreased coordination, Decreased skin integrity, Decreased strength, Impaired sensation, Impaired perceived functional ability, Pain  Visit Diagnosis: Pain in left ankle and joints of left foot  Difficulty in walking, not elsewhere classified  Localized edema  Stiffness of left ankle, not elsewhere classified  Muscle weakness (generalized)     Problem List Patient Active Problem List    Diagnosis Date Noted  . Cellulitis of toe of left foot 03/15/2016  . Toe ulcer ([place]) 03/15/2016  . Neoplasm of uncertain behavior of thyroid gland, right lobe 02/18/2015  . DIABETES MELLITUS, TYPE II 04/14/2009  . DIABETES MELLITUS, WITH RENAL COMPLICATIONS [id number]  . HYPERLIPIDEMIA 04/14/2009  . OBESITY 04/14/2009  . ANXIETY DEPRESSION 04/14/2009  . HYPERTENSION 04/14/2009  . COPD 04/14/2009  . CHEST PAIN 04/14/2009  .  TACHYCARDIA, HX OF 04/14/2009    Carney Living PT DPT 06/25/2016, 11:52 AM  [facility] [address] [place], [place], [address] Phone: [phone]   Fax:  [phone]  Name: [name] MRN: [id number] Date of Birth: 12/17/1965

## 2016-06-26 ENCOUNTER — Ambulatory Visit: Payer: 59 | Admitting: Physical Therapy

## 2016-06-26 ENCOUNTER — Ambulatory Visit (HOSPITAL_COMMUNITY): Admission: RE | Admit: 2016-06-26 | Payer: Self-pay | Source: Ambulatory Visit

## 2016-06-26 ENCOUNTER — Ambulatory Visit (HOSPITAL_COMMUNITY): Admission: RE | Admit: 2016-06-26 | Payer: 59 | Source: Ambulatory Visit

## 2016-06-26 DIAGNOSIS — R600 Localized edema: Secondary | ICD-10-CM

## 2016-06-26 DIAGNOSIS — M6281 Muscle weakness (generalized): Secondary | ICD-10-CM

## 2016-06-26 DIAGNOSIS — M25572 Pain in left ankle and joints of left foot: Secondary | ICD-10-CM | POA: Diagnosis not present

## 2016-06-26 DIAGNOSIS — M25672 Stiffness of left ankle, not elsewhere classified: Secondary | ICD-10-CM

## 2016-06-26 DIAGNOSIS — R262 Difficulty in walking, not elsewhere classified: Secondary | ICD-10-CM

## 2016-06-26 NOTE — Therapy (Signed)
[place], [place], [address] Phone: [phone]   Fax:  [phone]  Physical Therapy Treatment  Patient Details  Name: [name] MRN: [id number] Date of Birth: 03-Jul-1966 Referring Provider: Dr Jeannine Boga  Encounter Date: 06/26/2016      PT End of Session - 06/26/16 0815    Visit Number 6   Number of Visits 16   Date for PT Re-Evaluation 07/30/16   Authorization Type [facility]    PT Start Time 0800   PT Stop Time 0900   PT Time Calculation (min) 60 min      Past Medical History  Diagnosis Date  . Hypertension   . GERD (gastroesophageal reflux disease)   . Fibromyalgia   . Proteinuria   . PONV (postoperative nausea and vomiting)   . Anginal pain ([place])     chest pain  heart cath done  . History of hiatal hernia   . Kidney stones   . High cholesterol   . Chronic bronchitis ([place])     "get it q yr"  . Hypothyroidism   . Type II diabetes mellitus ([place])   . Iron deficiency anemia     "comes and goes"  . Migraine     "@ least once/month" (02/18/2015)  . History of gout   . Cancer ([place])   . Tachycardia   . Hepatic adenoma     Past Surgical History  Procedure Laterality Date  . Lumbar disc surgery  08/2011  . Colonoscopy    . Esophagogastroduodenoscopy    . Total thyroidectomy  02/18/2015  . Back surgery    . Cystoscopy with ureteroscopy, stone basketry and stent placement  ~ 2006  . Abdominal hysterectomy  ~2012    "lap"  . Cardiac catheterization  04/2009    "clean"  . Laparoscopic cholecystectomy  05/2004  . Thyroidectomy N/A 02/18/2015    Procedure: TOTAL THYROIDECTOMY;  Surgeon: [name], MD;  Location: [facility];  Service: General;  Laterality: N/A;  . Ankle reconstruction Left 03/29/2016    Procedure: LEFT ANKLE LATERAL LIGAMENT RECONSTRUCTION AND PERONEAL TENDON REPAIR OR TENOLYSIS;  Surgeon: Wylene Simmer, MD;  Location: [facility];  Service: Orthopedics;  Laterality: Left;     There were no vitals filed for this visit.      Subjective Assessment - 06/26/16 0807    Subjective  I am sore. I did alot of walking in my brace outdoors at 2 music concerts this weekend.    Currently in Pain? Yes   Pain Score 5    Pain Location Ankle   Pain Orientation Left   Pain Descriptors / Indicators Aching  deep ache, pulsing   Aggravating Factors  unlevel surfaces   Pain Relieving Factors rest elevation                         [facility] Adult PT Treatment/Exercise - 06/26/16 0001    Ambulation/Gait   Pre-Gait Activities staggered stand weight shifting focusing on toe house and heel strike    Gait Comments Forward walking in parallel bars   High Level Balance   High Level Balance Activities Backward walking;Side stepping;Tandem walking   Modalities   Modalities Vasopneumatic   Vasopneumatic   Number Minutes Vasopneumatic  15 minutes   Vasopnuematic Location  Ankle   Vasopneumatic Pressure Low   Vasopneumatic Temperature  32   Ankle Exercises: Aerobic   Stationary Bike Nustep L5 LE only x  7 minutes   Ankle Exercises: Standing   Rocker Board 2 minutes   Heel Raises 10 reps   Toe Raise 10 reps   Ankle Exercises: Seated   BAPS Level 2;Sitting  DF/PF, Pro/Sup, circles each way   Ankle Exercises: Stretches   Slant Board Stretch 60 seconds;2 reps                  PT Short Term Goals - 06/26/16 0839    PT SHORT TERM GOAL #1   Title Patients left figure eight edema will be > 1cm different then her right    Time 4   Period Weeks   Status On-going   PT SHORT TERM GOAL #2   Title Patient will demsotrate a 5 cm single leg stance time   Time 4   Period Weeks   Status On-going   PT SHORT TERM GOAL #3   Title Patient will ambulate 300' w/ no device and no walking boot    Baseline still uses device   Time 4   Period Weeks   Status Partially Met   PT SHORT TERM GOAL #4   Title Patient will demostrate 4/5 gross left andkle strength     Time 4   Period Weeks   Status On-going   PT SHORT TERM GOAL #5   Title Patient will be I w./ HEP for strength and ROM    Time 4   Period Weeks   Status Achieved           PT Long Term Goals - 06/04/16 2019    PT LONG TERM GOAL #1   Title Patient will ambualte 3000' with no significant pain in order to go shopping    Time 8   Period Weeks   Status New   PT LONG TERM GOAL #2   Title Patient will stand for 1 hour without increased pin in order to return to work    Time 8   Period Weeks   Status New   PT LONG TERM GOAL #3   Title Patient will go up/down 8 steps with reciprocol gait pattern.    Time 8   Period Weeks   Status New               Plan - 06/26/16 0840    Clinical Impression Statement STG# 3 partially met. STG#5 met. Pt is independent with initial HEP. Pt reports walking over the weeend to two outdoor concerts with her ASO and SPC. She is sore but she did well. Performed dynamic balance activities in parallel bars progressing from UE support to no UE support.    PT Next Visit Plan continue with manual therapy to improve DF;, Baps board, standing march, standing weight shift, ABC's, review HEP ; standing exercises as tolerated, rocker board   PT Home Exercise Plan 4 way ankle ROM, gastroc stretch; toe yoga      Patient will benefit from skilled therapeutic intervention in order to improve the following deficits and impairments:  Abnormal gait, Decreased activity tolerance, Decreased balance, Decreased mobility, Decreased knowledge of use of DME, Decreased endurance, Decreased coordination, Decreased skin integrity, Decreased strength, Impaired sensation, Impaired perceived functional ability, Pain  Visit Diagnosis: Pain in left ankle and joints of left foot  Difficulty in walking, not elsewhere classified  Localized edema  Muscle weakness (generalized)  Stiffness of left ankle, not elsewhere classified     Problem List Patient Active Problem List    Diagnosis Date Noted  .  Cellulitis of toe of left foot 03/15/2016  . Toe ulcer ([place]) 03/15/2016  . Neoplasm of uncertain behavior of thyroid gland, right lobe 02/18/2015  . DIABETES MELLITUS, TYPE II 04/14/2009  . DIABETES MELLITUS, WITH RENAL COMPLICATIONS 54/24/8144  . HYPERLIPIDEMIA 04/14/2009  . OBESITY 04/14/2009  . ANXIETY DEPRESSION 04/14/2009  . HYPERTENSION 04/14/2009  . COPD 04/14/2009  . CHEST PAIN 04/14/2009  . TACHYCARDIA, HX OF 04/14/2009    Dorene Ar, PTA 06/26/2016, 9:01 AM  [place], [place], [address] Phone: [phone]   Fax:  [phone]  Name: [name] MRN: [id number] Date of Birth: [birth year]

## 2016-06-27 DIAGNOSIS — Z01419 Encounter for gynecological examination (general) (routine) without abnormal findings: Secondary | ICD-10-CM | POA: Diagnosis not present

## 2016-06-28 ENCOUNTER — Ambulatory Visit: Payer: 59 | Admitting: Physical Therapy

## 2016-06-28 DIAGNOSIS — M25672 Stiffness of left ankle, not elsewhere classified: Secondary | ICD-10-CM | POA: Diagnosis not present

## 2016-06-28 DIAGNOSIS — R262 Difficulty in walking, not elsewhere classified: Secondary | ICD-10-CM

## 2016-06-28 DIAGNOSIS — M6281 Muscle weakness (generalized): Secondary | ICD-10-CM

## 2016-06-28 DIAGNOSIS — R600 Localized edema: Secondary | ICD-10-CM

## 2016-06-28 DIAGNOSIS — M25572 Pain in left ankle and joints of left foot: Secondary | ICD-10-CM

## 2016-06-28 NOTE — Therapy (Signed)
[place], [place], [address] Phone: [phone]   Fax:  [phone]  Physical Therapy Treatment  Patient Details  Name: [name] MRN: [id number] Date of Birth: February 06, 1966 Referring Provider: Dr Jeannine Boga  Encounter Date: 06/28/2016      PT End of Session - 06/28/16 0836    Visit Number 7   Number of Visits 16   Date for PT Re-Evaluation 07/30/16   Authorization Type [facility]    PT Start Time 0800   PT Stop Time 0900   PT Time Calculation (min) 60 min   Activity Tolerance Patient tolerated treatment well   Behavior During Therapy [facility] for tasks assessed/performed      Past Medical History  Diagnosis Date  . Hypertension   . GERD (gastroesophageal reflux disease)   . Fibromyalgia   . Proteinuria   . PONV (postoperative nausea and vomiting)   . Anginal pain ([place])     chest pain  heart cath done  . History of hiatal hernia   . Kidney stones   . High cholesterol   . Chronic bronchitis ([place])     "get it q yr"  . Hypothyroidism   . Type II diabetes mellitus ([place])   . Iron deficiency anemia     "comes and goes"  . Migraine     "@ least once/month" (02/18/2015)  . History of gout   . Cancer ([place])   . Tachycardia   . Hepatic adenoma     Past Surgical History  Procedure Laterality Date  . Lumbar disc surgery  08/2011  . Colonoscopy    . Esophagogastroduodenoscopy    . Total thyroidectomy  02/18/2015  . Back surgery    . Cystoscopy with ureteroscopy, stone basketry and stent placement  ~ 2006  . Abdominal hysterectomy  ~2012    "lap"  . Cardiac catheterization  04/2009    "clean"  . Laparoscopic cholecystectomy  05/2004  . Thyroidectomy N/A 02/18/2015    Procedure: TOTAL THYROIDECTOMY;  Surgeon: [name], MD;  Location: [facility];  Service: General;  Laterality: N/A;  . Ankle reconstruction Left 03/29/2016    Procedure: LEFT ANKLE LATERAL LIGAMENT RECONSTRUCTION AND PERONEAL  TENDON REPAIR OR TENOLYSIS;  Surgeon: Wylene Simmer, MD;  Location: [facility];  Service: Orthopedics;  Laterality: Left;    There were no vitals filed for this visit.      Subjective Assessment - 06/28/16 0810    Subjective Patient reports increased soreness over the past few days. She has noted increased swelling. She has been walking in the house without the boot. She also has been walking further outside the house. she was able to go to [employer].    Pertinent History Ankle surgery 03/29/2016, Fibromyalgia, Tachycardia,    Limitations Standing;Walking;House hold activities   How long can you stand comfortably? > 30 minin the brace anfd the shoe    How long can you walk comfortably? with a cane limited community distances    Diagnostic tests Nothing post-op   Patient Stated Goals Patient would like to be able to walk with less pain    Currently in Pain? Yes   Pain Score 5    Pain Location Ankle   Pain Orientation Left   Pain Descriptors / Indicators Aching   Pain Type Chronic pain   Pain Onset More than a month ago   Pain Frequency Constant   Aggravating Factors  unlevel surfaces  Pain Relieving Factors rest, elevation    Effect of Pain on Daily Activities unable to walk                          [facility] Adult PT Treatment/Exercise - 06/28/16 0001    Modalities   Modalities Vasopneumatic   Vasopneumatic   Number Minutes Vasopneumatic  15 minutes   Vasopnuematic Location  Ankle   Vasopneumatic Pressure Low   Vasopneumatic Temperature  32   Manual Therapy   Manual therapy comments manual therapy to improve dorsi flexion; Soft tissue mobilization and edema massage to decrease pain and increase mobility.    Ankle Exercises: Seated   Other Seated Ankle Exercises 2x10 4way ankle t-band    Ankle Exercises: Stretches   Slant Board Stretch 60 seconds;2 reps                  PT Short Term Goals - 06/26/16 0839    PT SHORT TERM GOAL #1   Title  Patients left figure eight edema will be > 1cm different then her right    Time 4   Period Weeks   Status On-going   PT SHORT TERM GOAL #2   Title Patient will demsotrate a 5 cm single leg stance time   Time 4   Period Weeks   Status On-going   PT SHORT TERM GOAL #3   Title Patient will ambulate 300' w/ no device and no walking boot    Baseline still uses device   Time 4   Period Weeks   Status Partially Met   PT SHORT TERM GOAL #4   Title Patient will demostrate 4/5 gross left andkle strength    Time 4   Period Weeks   Status On-going   PT SHORT TERM GOAL #5   Title Patient will be I w./ HEP for strength and ROM    Time 4   Period Weeks   Status Achieved           PT Long Term Goals - 06/04/16 2019    PT LONG TERM GOAL #1   Title Patient will ambualte 3000' with no significant pain in order to go shopping    Time 8   Period Weeks   Status New   PT LONG TERM GOAL #2   Title Patient will stand for 1 hour without increased pin in order to return to work    Time 8   Period Weeks   Status New   PT LONG TERM GOAL #3   Title Patient will go up/down 8 steps with reciprocol gait pattern.    Time 8   Period Weeks   Status New               Plan - 06/28/16 1023    Clinical Impression Statement Therapy focused more on manual therapy to rduce pain and improve movement today. She reported improved pain when she left. Patient remains very moptivated to get stronger. PT advanmced her band for home ankle 4 way  to [place].     PT Frequency 2x / week   PT Duration 8 weeks   PT Treatment/Interventions ADLs/Self Care Home Management;Cryotherapy;Electrical Stimulation;Moist Heat;Therapeutic exercise;Therapeutic activities;Functional mobility training;Stair training;Gait training;Ultrasound;Balance training;Patient/family education;Manual techniques;Passive range of motion;Scar mobilization;Vasopneumatic Device   PT Next Visit Plan continue with manual therapy. Continue working  on Physicist, medical; continue with vasopnematic device to control swelling.    Consulted and Agree with Plan of  Care Patient      Patient will benefit from skilled therapeutic intervention in order to improve the following deficits and impairments:  Abnormal gait, Decreased activity tolerance, Decreased balance, Decreased mobility, Decreased knowledge of use of DME, Decreased endurance, Decreased coordination, Decreased skin integrity, Decreased strength, Impaired sensation, Impaired perceived functional ability, Pain  Visit Diagnosis: Pain in left ankle and joints of left foot  Difficulty in walking, not elsewhere classified  Localized edema  Muscle weakness (generalized)  Stiffness of left ankle, not elsewhere classified     Problem List Patient Active Problem List   Diagnosis Date Noted  . Cellulitis of toe of left foot 03/15/2016  . Toe ulcer ([place]) 03/15/2016  . Neoplasm of uncertain behavior of thyroid gland, right lobe 02/18/2015  . DIABETES MELLITUS, TYPE II 04/14/2009  . DIABETES MELLITUS, WITH RENAL COMPLICATIONS 76/39/4320  . HYPERLIPIDEMIA 04/14/2009  . OBESITY 04/14/2009  . ANXIETY DEPRESSION 04/14/2009  . HYPERTENSION 04/14/2009  . COPD 04/14/2009  . CHEST PAIN 04/14/2009  . TACHYCARDIA, HX OF 04/14/2009    Carney Living PT DPT  06/28/2016, 10:55 AM  [facility] [address] [place], [place], [address] Phone: [phone]   Fax:  [phone]  Name: LENNIE DUNNIGAN MRN: [id number] Date of Birth: 17-Nov-1966

## 2016-06-29 ENCOUNTER — Encounter: Payer: Self-pay | Admitting: Podiatry

## 2016-06-29 ENCOUNTER — Ambulatory Visit (INDEPENDENT_AMBULATORY_CARE_PROVIDER_SITE_OTHER): Payer: 59 | Admitting: Podiatry

## 2016-06-29 VITALS — BP 117/73 | HR 90 | Resp 16

## 2016-06-29 DIAGNOSIS — L03116 Cellulitis of left lower limb: Secondary | ICD-10-CM

## 2016-06-29 DIAGNOSIS — L84 Corns and callosities: Secondary | ICD-10-CM

## 2016-07-03 ENCOUNTER — Ambulatory Visit: Payer: 59 | Admitting: Physical Therapy

## 2016-07-03 DIAGNOSIS — M25672 Stiffness of left ankle, not elsewhere classified: Secondary | ICD-10-CM

## 2016-07-03 DIAGNOSIS — R262 Difficulty in walking, not elsewhere classified: Secondary | ICD-10-CM | POA: Diagnosis not present

## 2016-07-03 DIAGNOSIS — M6281 Muscle weakness (generalized): Secondary | ICD-10-CM

## 2016-07-03 DIAGNOSIS — R600 Localized edema: Secondary | ICD-10-CM

## 2016-07-03 DIAGNOSIS — M25572 Pain in left ankle and joints of left foot: Secondary | ICD-10-CM

## 2016-07-03 NOTE — Therapy (Signed)
[place], [place], [address] Phone: [phone]   Fax:  [phone]  Physical Therapy Treatment  Patient Details  Name: [name] MRN: [id number] Date of Birth: [birth year] Referring Provider: Dr Jeannine Boga  Encounter Date: 07/03/2016      PT End of Session - 07/03/16 0815    Visit Number 8   Number of Visits 16   Date for PT Re-Evaluation 07/30/16   Authorization Type [facility]    PT Start Time 0803   PT Stop Time 0900   PT Time Calculation (min) 57 min      Past Medical History:  Diagnosis Date  . Anginal pain ([place])    chest pain  heart cath done  . Cancer ([place])   . Chronic bronchitis ([place])    "get it q yr"  . Fibromyalgia   . GERD (gastroesophageal reflux disease)   . Hepatic adenoma   . High cholesterol   . History of gout   . History of hiatal hernia   . Hypertension   . Hypothyroidism   . Iron deficiency anemia    "comes and goes"  . Kidney stones   . Migraine    "@ least once/month" (02/18/2015)  . PONV (postoperative nausea and vomiting)   . Proteinuria   . Tachycardia   . Type II diabetes mellitus ([place])     Past Surgical History:  Procedure Laterality Date  . ABDOMINAL HYSTERECTOMY  ~2012   "lap"  . ANKLE RECONSTRUCTION Left 03/29/2016   Procedure: LEFT ANKLE LATERAL LIGAMENT RECONSTRUCTION AND PERONEAL TENDON REPAIR OR TENOLYSIS;  Surgeon: Wylene Simmer, MD;  Location: [facility];  Service: Orthopedics;  Laterality: Left;  . BACK SURGERY    . CARDIAC CATHETERIZATION  04/2009   "clean"  . COLONOSCOPY    . CYSTOSCOPY WITH URETEROSCOPY, STONE BASKETRY AND STENT PLACEMENT  ~ 2006  . ESOPHAGOGASTRODUODENOSCOPY    . LAPAROSCOPIC CHOLECYSTECTOMY  05/2004  . LUMBAR DISC SURGERY  08/2011  . THYROIDECTOMY N/A 02/18/2015   Procedure: TOTAL THYROIDECTOMY;  Surgeon: [name], MD;  Location: [facility];  Service: General;  Laterality: N/A;  . TOTAL THYROIDECTOMY  02/18/2015     There were no vitals filed for this visit.      Subjective Assessment - 07/03/16 0919    Subjective Its not as stiff   Currently in Pain? Yes   Pain Score 3    Pain Orientation Left   Pain Descriptors / Indicators Aching            [facility] PT Assessment - 07/03/16 0001      Ambulation/Gait   Pre-Gait Activities staggered stand weight shift focusing on toe off and heel strike    Gait Comments forward walking in parallel bars focusing on heel strike and toe off                     [facility] Adult PT Treatment/Exercise - 07/03/16 0001      High Level Balance   High Level Balance Activities Backward walking;Tandem walking   High Level Balance Comments Tandem stance with each foot forward, difficult with left LE back      Vasopneumatic   Number Minutes Vasopneumatic  15 minutes   Vasopnuematic Location  Ankle   Vasopneumatic Pressure Low   Vasopneumatic Temperature  32     Ankle Exercises: Standing   SLS with UE support   Heel Raises 10 reps   Toe Raise 10  reps   Other Standing Ankle Exercises standing 3 way hip left and right x 10 x 2 each , h/s curl x 10   Other Standing Ankle Exercises Foam pad wide and narrow balance without UE support adding head turns, nods, trunk rotations      Ankle Exercises: Aerobic   Stationary Bike Nustep L5 LE only x 7 minutes                  PT Short Term Goals - 06/26/16 0839      PT SHORT TERM GOAL #1   Title Patients left figure eight edema will be > 1cm different then her right    Time 4   Period Weeks   Status On-going     PT SHORT TERM GOAL #2   Title Patient will demsotrate a 5 cm single leg stance time   Time 4   Period Weeks   Status On-going     PT SHORT TERM GOAL #3   Title Patient will ambulate 300' w/ no device and no walking boot    Baseline still uses device   Time 4   Period Weeks   Status Partially Met     PT SHORT TERM GOAL #4   Title Patient will demostrate 4/5 gross left andkle  strength    Time 4   Period Weeks   Status On-going     PT SHORT TERM GOAL #5   Title Patient will be I w./ HEP for strength and ROM    Time 4   Period Weeks   Status Achieved           PT Long Term Goals - 06/04/16 2019      PT LONG TERM GOAL #1   Title Patient will ambualte 3000' with no significant pain in order to go shopping    Time 8   Period Weeks   Status New     PT LONG TERM GOAL #2   Title Patient will stand for 1 hour without increased pin in order to return to work    Time 8   Period Weeks   Status New     PT LONG TERM GOAL #3   Title Patient will go up/down 8 steps with reciprocol gait pattern.    Time 8   Period Weeks   Status New               Plan - 07/03/16 0920    Clinical Impression Statement Worked on balance today. Pt did well with narrow and wide base balance on foam. Able to demonstrate decreased gait deviations without brace after pre gait exercises focusing on heel strike, toe off and equal step length. No additional goals met.    PT Next Visit Plan continue with manual therapy. Continue working on gait activiy and balance;  continue with vasopnematic device to control swelling.       Patient will benefit from skilled therapeutic intervention in order to improve the following deficits and impairments:  Abnormal gait, Decreased activity tolerance, Decreased balance, Decreased mobility, Decreased knowledge of use of DME, Decreased endurance, Decreased coordination, Decreased skin integrity, Decreased strength, Impaired sensation, Impaired perceived functional ability, Pain  Visit Diagnosis: Pain in left ankle and joints of left foot  Difficulty in walking, not elsewhere classified  Localized edema  Muscle weakness (generalized)  Stiffness of left ankle, not elsewhere classified     Problem List Patient Active Problem List   Diagnosis Date Noted  .  Cellulitis of toe of left foot 03/15/2016  . Toe ulcer ([place]) 03/15/2016  .  Neoplasm of uncertain behavior of thyroid gland, right lobe 02/18/2015  . DIABETES MELLITUS, TYPE II 04/14/2009  . DIABETES MELLITUS, WITH RENAL COMPLICATIONS 40/97/3532  . HYPERLIPIDEMIA 04/14/2009  . OBESITY 04/14/2009  . ANXIETY DEPRESSION 04/14/2009  . HYPERTENSION 04/14/2009  . COPD 04/14/2009  . CHEST PAIN 04/14/2009  . TACHYCARDIA, HX OF 04/14/2009    Dorene Ar, PTA 07/03/2016, 10:18 AM  [facility] [address] [place], [place], [address] Phone: [phone]   Fax:  [phone]  Name: [name] MRN: [id number] Date of Birth: 1966/11/04

## 2016-07-04 NOTE — Progress Notes (Signed)
Patient ID: SOLENE VASILIOU, female   DOB: 1966/01/10, 50 y.o.   MRN: [id number]  Subjective: 50 year old female presents today for follow-up evaluation of a wound to the left hallux. She states that she's had no drainage, pus, redness or any swelling. The toe is doing good. She does continue to CAM boot status post left ankle surgery. She does feel that she has made some slow improvement since her left ankle surgery and she is continuing to follow Dr. Doran Durand for this. Denies any systemic complaints such as fevers, chills, nausea, vomiting. No acute changes since last appointment, and no other complaints at this time.   Objective: AAO x3, NAD DP/PT pulses palpable bilaterally, CRT less than 3 seconds Incision from her surgery is healed with a scar. There is no tenderness to palpation. Hyperkeratotic lesion left plantar hallux. Upon debridement there is no underlying ulceration, drainage or other signs of infection of the area is pre-ulcerative. There is no edema to the toe. No other open lesions or pre-ulcerative lesions identified at this time. No pain with calf compression, swelling, warmth, erythema  Assessment: Left leg cellulitis; pre-ulcerative lesion to the hallux  Plan: -All treatment options discussed with the patient including all alternatives, risks, complications.  -Lesion was debrided without complications or bleeding. Continue offloading. -Monitor for signs or symptoms of reoccurring infection. Call the office sooner should any occur. Follow-up as scheduled or sooner if needed. Continue follow Dr. Doran Durand for left ankle surgery.  Celesta Gentile, DPM

## 2016-07-05 ENCOUNTER — Ambulatory Visit: Payer: 59 | Admitting: Physical Therapy

## 2016-07-05 DIAGNOSIS — M25572 Pain in left ankle and joints of left foot: Secondary | ICD-10-CM

## 2016-07-05 DIAGNOSIS — R262 Difficulty in walking, not elsewhere classified: Secondary | ICD-10-CM

## 2016-07-05 DIAGNOSIS — M6281 Muscle weakness (generalized): Secondary | ICD-10-CM | POA: Diagnosis not present

## 2016-07-05 DIAGNOSIS — R600 Localized edema: Secondary | ICD-10-CM

## 2016-07-05 DIAGNOSIS — M25672 Stiffness of left ankle, not elsewhere classified: Secondary | ICD-10-CM | POA: Diagnosis not present

## 2016-07-05 NOTE — Therapy (Signed)
[place], [place], [address] Phone: [phone]   Fax:  [phone]  Physical Therapy Treatment  Patient Details  Name: [name] MRN: [id number] Date of Birth: 1966/03/08 Referring Provider: Dr Jeannine Boga  Encounter Date: 07/05/2016      PT End of Session - 07/05/16 0855    Visit Number 9   Number of Visits 16   Date for PT Re-Evaluation 07/30/16   Authorization Type [facility] UMR    PT Start Time 0809   PT Stop Time 0903   PT Time Calculation (min) 54 min   Activity Tolerance Patient tolerated treatment well   Behavior During Therapy [facility] for tasks assessed/performed      Past Medical History:  Diagnosis Date  . Anginal pain ([place])    chest pain  heart cath done  . Cancer ([place])   . Chronic bronchitis ([place])    "get it q yr"  . Fibromyalgia   . GERD (gastroesophageal reflux disease)   . Hepatic adenoma   . High cholesterol   . History of gout   . History of hiatal hernia   . Hypertension   . Hypothyroidism   . Iron deficiency anemia    "comes and goes"  . Kidney stones   . Migraine    "@ least once/month" (02/18/2015)  . PONV (postoperative nausea and vomiting)   . Proteinuria   . Tachycardia   . Type II diabetes mellitus ([place])     Past Surgical History:  Procedure Laterality Date  . ABDOMINAL HYSTERECTOMY  ~2012   "lap"  . ANKLE RECONSTRUCTION Left 03/29/2016   Procedure: LEFT ANKLE LATERAL LIGAMENT RECONSTRUCTION AND PERONEAL TENDON REPAIR OR TENOLYSIS;  Surgeon: Wylene Simmer, MD;  Location: [place];  Service: Orthopedics;  Laterality: Left;  . BACK SURGERY    . CARDIAC CATHETERIZATION  04/2009   "clean"  . COLONOSCOPY    . CYSTOSCOPY WITH URETEROSCOPY, STONE BASKETRY AND STENT PLACEMENT  ~ 2006  . ESOPHAGOGASTRODUODENOSCOPY    . LAPAROSCOPIC CHOLECYSTECTOMY  05/2004  . LUMBAR DISC SURGERY  08/2011  . THYROIDECTOMY N/A 02/18/2015   Procedure: TOTAL THYROIDECTOMY;  Surgeon:  [name], MD;  Location: [place];  Service: General;  Laterality: N/A;  . TOTAL THYROIDECTOMY  02/18/2015    There were no vitals filed for this visit.      Subjective Assessment - 07/05/16 0853    Subjective Patient reports the pain is under control. She was able to go to [employer] to shop without much pain. She is a little more dsore now that she is out of the boot.    Pertinent History Ankle surgery 03/29/2016, Fibromyalgia, Tachycardia,    Limitations Standing;Walking;House hold activities   How long can you stand comfortably? > 30 minin the brace anfd the shoe    How long can you walk comfortably? with a cane limited community distances    Diagnostic tests Nothing post-op   Patient Stated Goals Patient would like to be able to walk with less pain    Currently in Pain? Yes   Pain Score 3    Pain Location Ankle   Pain Orientation Left   Pain Descriptors / Indicators Aching   Pain Type Chronic pain   Pain Onset More than a month ago   Pain Frequency Constant   Aggravating Factors  unlevel surface    Pain Relieving Factors rest, elevation    Effect of Pain on Daily Activities unable to walk long  distances   Multiple Pain Sites No                         [facility] Adult PT Treatment/Exercise - 07/05/16 0815      Ambulation/Gait   Pre-Gait Activities Stagerd stance with weight shift   Gait Comments forward walking in parallel bars focusing on heel strike and toe off     Ankle Exercises: Standing   SLS with UE support 2x10    Toe Raise 20 reps     Ankle Exercises: Aerobic   Stationary Bike Nustep L3 LE only x 7 minutes                PT Education - 07/05/16 0855    Education provided Yes   Education Details continue with current home exercises   Person(s) Educated Patient   Methods Explanation   Comprehension Verbalized understanding          PT Short Term Goals - 06/26/16 0839      PT SHORT TERM GOAL #1   Title Patients left figure eight  edema will be > 1cm different then her right    Time 4   Period Weeks   Status On-going     PT SHORT TERM GOAL #2   Title Patient will demsotrate a 5 cm single leg stance time   Time 4   Period Weeks   Status On-going     PT SHORT TERM GOAL #3   Title Patient will ambulate 300' w/ no device and no walking boot    Baseline still uses device   Time 4   Period Weeks   Status Partially Met     PT SHORT TERM GOAL #4   Title Patient will demostrate 4/5 gross left andkle strength    Time 4   Period Weeks   Status On-going     PT SHORT TERM GOAL #5   Title Patient will be I w./ HEP for strength and ROM    Time 4   Period Weeks   Status Achieved           PT Long Term Goals - 07/05/16 2706      PT LONG TERM GOAL #1   Title Patient will ambualte 3000' with no significant pain in order to go shopping    Baseline ambulated in [employer] over 3000' but continued to have pain    Time 8   Period Weeks   Status New     PT LONG TERM GOAL #2   Title Patient will stand for 1 hour without increased pin in order to return to work    Baseline Stood about a 1/2 hour with some pain and swelling    Time 8   Period Weeks   Status New     PT LONG TERM GOAL #3   Title Patient will go up/down 8 steps with reciprocol gait pattern.    Baseline PT with left PF weakness 3-/5  unable to clear full AROM in standing   Time 8   Period Weeks   Status On-going     PT LONG TERM GOAL #4   Title Pt will be 2/10 or less in left lower extremity for all functional activities.   Baseline Pt 5/10 even post injection   Period Weeks   Status On-going     PT LONG TERM GOAL #5   Title Pt will negotiate steps independently without use of assistive device  Baseline has not started steps yet    Time 8   Period Weeks   Status Partially Met               Plan - 07/05/16 0900    Clinical Impression Statement Patient continues to have decreased balance but she reported feeling a little better  today with her balance exercises. The patient was 9 minuttes late for her appointment 2nd to traffic.   Rehab Potential Good   PT Frequency 2x / week   PT Duration 8 weeks   PT Treatment/Interventions ADLs/Self Care Home Management;Cryotherapy;Electrical Stimulation;Moist Heat;Therapeutic exercise;Therapeutic activities;Functional mobility training;Stair training;Gait training;Ultrasound;Balance training;Patient/family education;Manual techniques;Passive range of motion;Scar mobilization;Vasopneumatic Device   PT Next Visit Plan continue with manual therapy. Continue working on gait activiy and balance;  continue with vasopnematic device to control swelling.    PT Home Exercise Plan 4 way ankle ROM, gastroc stretch; toe yoga   Consulted and Agree with Plan of Care Patient      Patient will benefit from skilled therapeutic intervention in order to improve the following deficits and impairments:  Abnormal gait, Decreased activity tolerance, Decreased balance, Decreased mobility, Decreased knowledge of use of DME, Decreased endurance, Decreased coordination, Decreased skin integrity, Decreased strength, Impaired sensation, Impaired perceived functional ability, Pain  Visit Diagnosis: Pain in left ankle and joints of left foot  Difficulty in walking, not elsewhere classified  Localized edema  Muscle weakness (generalized)  Stiffness of left ankle, not elsewhere classified     Problem List Patient Active Problem List   Diagnosis Date Noted  . Cellulitis of toe of left foot 03/15/2016  . Toe ulcer ([place]) 03/15/2016  . Neoplasm of uncertain behavior of thyroid gland, right lobe 02/18/2015  . DIABETES MELLITUS, TYPE II 04/14/2009  . DIABETES MELLITUS, WITH RENAL COMPLICATIONS 90/38/3338  . HYPERLIPIDEMIA 04/14/2009  . OBESITY 04/14/2009  . ANXIETY DEPRESSION 04/14/2009  . HYPERTENSION 04/14/2009  . COPD 04/14/2009  . CHEST PAIN 04/14/2009  . TACHYCARDIA, HX OF 04/14/2009    Carney Living 07/05/2016, 11:40 AM  [facility] [address] [place], [place], [address] Phone: [phone]   Fax:  [phone]  Name: [name] MRN: [id number] Date of Birth: 06-06-1966

## 2016-07-09 DIAGNOSIS — E119 Type 2 diabetes mellitus without complications: Secondary | ICD-10-CM | POA: Diagnosis not present

## 2016-07-10 ENCOUNTER — Ambulatory Visit: Payer: 59 | Attending: Endocrinology | Admitting: Physical Therapy

## 2016-07-10 DIAGNOSIS — R262 Difficulty in walking, not elsewhere classified: Secondary | ICD-10-CM

## 2016-07-10 DIAGNOSIS — R600 Localized edema: Secondary | ICD-10-CM | POA: Diagnosis not present

## 2016-07-10 DIAGNOSIS — M25672 Stiffness of left ankle, not elsewhere classified: Secondary | ICD-10-CM

## 2016-07-10 DIAGNOSIS — M6281 Muscle weakness (generalized): Secondary | ICD-10-CM

## 2016-07-10 DIAGNOSIS — M25572 Pain in left ankle and joints of left foot: Secondary | ICD-10-CM | POA: Diagnosis not present

## 2016-07-10 NOTE — Therapy (Signed)
[place], [place], [address] Phone: [phone]   Fax:  [phone]  Physical Therapy Treatment  Patient Details  Name: [name] MRN: [id number] Date of Birth: 1966-03-27 Referring Provider: Dr Jeannine Boga  Encounter Date: 07/10/2016      PT End of Session - 07/10/16 0919    Visit Number 10   Number of Visits 16   Date for PT Re-Evaluation 07/30/16   Authorization Type [facility] UMR    PT Start Time 0845   PT Stop Time 0945   PT Time Calculation (min) 60 min      Past Medical History:  Diagnosis Date  . Anginal pain ([place])    chest pain  heart cath done  . Cancer ([place])   . Chronic bronchitis ([place])    "get it q yr"  . Fibromyalgia   . GERD (gastroesophageal reflux disease)   . Hepatic adenoma   . High cholesterol   . History of gout   . History of hiatal hernia   . Hypertension   . Hypothyroidism   . Iron deficiency anemia    "comes and goes"  . Kidney stones   . Migraine    "@ least once/month" (02/18/2015)  . PONV (postoperative nausea and vomiting)   . Proteinuria   . Tachycardia   . Type II diabetes mellitus ([place])     Past Surgical History:  Procedure Laterality Date  . ABDOMINAL HYSTERECTOMY  ~2012   "lap"  . ANKLE RECONSTRUCTION Left 03/29/2016   Procedure: LEFT ANKLE LATERAL LIGAMENT RECONSTRUCTION AND PERONEAL TENDON REPAIR OR TENOLYSIS;  Surgeon: Wylene Simmer, MD;  Location: [place];  Service: Orthopedics;  Laterality: Left;  . BACK SURGERY    . CARDIAC CATHETERIZATION  04/2009   "clean"  . COLONOSCOPY    . CYSTOSCOPY WITH URETEROSCOPY, STONE BASKETRY AND STENT PLACEMENT  ~ 2006  . ESOPHAGOGASTRODUODENOSCOPY    . LAPAROSCOPIC CHOLECYSTECTOMY  05/2004  . LUMBAR DISC SURGERY  08/2011  . THYROIDECTOMY N/A 02/18/2015   Procedure: TOTAL THYROIDECTOMY;  Surgeon: [name], MD;  Location: [place];  Service: General;  Laterality: N/A;  . TOTAL THYROIDECTOMY  02/18/2015     There were no vitals filed for this visit.      Subjective Assessment - 07/10/16 0917    Subjective I made it in the store for an hour but it was painful    Currently in Pain? Yes   Pain Score 7    Pain Location Ankle   Pain Orientation Left   Pain Descriptors / Indicators Aching  deep   Aggravating Factors  prolonged standing/walking    Pain Relieving Factors rest,elevation                         [facility] Adult PT Treatment/Exercise - 07/10/16 0001      High Level Balance   High Level Balance Activities --   High Level Balance Comments Tandem stance with each foot forward,, narrow, wide, staggered standing with pertubations in all directions (in parallel bars)      Knee/Hip Exercises: Supine   Hip Adduction Isometric 10 reps   Straight Leg Raises 10 reps   Other Supine Knee/Hip Exercises Hooklying clam with red Band x 20     Knee/Hip Exercises: Sidelying   Hip ABduction 10 reps     Vasopneumatic   Number Minutes Vasopneumatic  15 minutes   Vasopnuematic Location  Ankle   Vasopneumatic  Pressure Low   Vasopneumatic Temperature  32     Ankle Exercises: Aerobic   Stationary Bike Nustep L4LE only x 7 minutes     Ankle Exercises: Standing   Rocker Board 2 minutes   Heel Raises 20 reps   Toe Raise 20 reps     Ankle Exercises: Stretches   Slant Board Stretch 60 seconds;2 reps                  PT Short Term Goals - 07/10/16 1127      PT SHORT TERM GOAL #1   Title Patients left figure eight edema will be > 1cm different then her right    Time 4   Period Weeks   Status On-going     PT SHORT TERM GOAL #2   Title Patient will demsotrate a 5 cm single leg stance time   Time 4   Period Weeks   Status On-going     PT SHORT TERM GOAL #3   Title Patient will ambulate 300' w/ no device and no walking boot    Baseline still uses device   Time 4   Period Weeks   Status Partially Met     PT SHORT TERM GOAL #4   Title Patient will  demostrate 4/5 gross left andkle strength    Time 4   Period Weeks   Status On-going     PT SHORT TERM GOAL #5   Title Patient will be I w./ HEP for strength and ROM    Time 4   Period Weeks   Status Achieved           PT Long Term Goals - 07/05/16 9191      PT LONG TERM GOAL #1   Title Patient will ambualte 3000' with no significant pain in order to go shopping    Baseline ambulated in [employer] over 3000' but continued to have pain    Time 8   Period Weeks   Status New     PT LONG TERM GOAL #2   Title Patient will stand for 1 hour without increased pin in order to return to work    Baseline Stood about a 1/2 hour with some pain and swelling    Time 8   Period Weeks   Status New     PT LONG TERM GOAL #3   Title Patient will go up/down 8 steps with reciprocol gait pattern.    Baseline PT with left PF weakness 3-/5  unable to clear full AROM in standing   Time 8   Period Weeks   Status On-going     PT LONG TERM GOAL #4   Title Pt will be 2/10 or less in left lower extremity for all functional activities.   Baseline Pt 5/10 even post injection   Period Weeks   Status On-going     PT LONG TERM GOAL #5   Title Pt will negotiate steps independently without use of assistive device   Baseline has not started steps yet    Time 8   Period Weeks   Status Partially Met               Plan - 07/10/16 1127    Clinical Impression Statement Pt reports increasd swelling and pain today that feels deep and achy. We continued ankle balance and strength exercises today with good tolerance. Improved lift with heel raises. Improved stability with doble leg balance exercises. Able to maintain balance with  manual pertubations in various foot positions. Vaso for edema post exercises.    PT Next Visit Plan CHECK MMT; continue with manual therapy. Continue working on gait activiy and balance;  continue with vasopnematic device to control swelling.       Patient will benefit from  skilled therapeutic intervention in order to improve the following deficits and impairments:  Abnormal gait, Decreased activity tolerance, Decreased balance, Decreased mobility, Decreased knowledge of use of DME, Decreased endurance, Decreased coordination, Decreased skin integrity, Decreased strength, Impaired sensation, Impaired perceived functional ability, Pain  Visit Diagnosis: Pain in left ankle and joints of left foot  Difficulty in walking, not elsewhere classified  Localized edema  Stiffness of left ankle, not elsewhere classified  Muscle weakness (generalized)     Problem List Patient Active Problem List   Diagnosis Date Noted  . Cellulitis of toe of left foot 03/15/2016  . Toe ulcer ([place]) 03/15/2016  . Neoplasm of uncertain behavior of thyroid gland, right lobe 02/18/2015  . DIABETES MELLITUS, TYPE II 04/14/2009  . DIABETES MELLITUS, WITH RENAL COMPLICATIONS 58/71/8410  . HYPERLIPIDEMIA 04/14/2009  . OBESITY 04/14/2009  . ANXIETY DEPRESSION 04/14/2009  . HYPERTENSION 04/14/2009  . COPD 04/14/2009  . CHEST PAIN 04/14/2009  . TACHYCARDIA, HX OF 04/14/2009    Dorene Ar, PTA 07/10/2016, 11:33 AM  [facility] [address] [place], [place], [address] Phone: [phone]   Fax:  [phone]  Name: AZZIE THIEM MRN: [id number] Date of Birth: 1966/11/08

## 2016-07-12 ENCOUNTER — Ambulatory Visit: Payer: 59 | Admitting: Physical Therapy

## 2016-07-12 DIAGNOSIS — R600 Localized edema: Secondary | ICD-10-CM | POA: Diagnosis not present

## 2016-07-12 DIAGNOSIS — M25572 Pain in left ankle and joints of left foot: Secondary | ICD-10-CM

## 2016-07-12 DIAGNOSIS — M25672 Stiffness of left ankle, not elsewhere classified: Secondary | ICD-10-CM | POA: Diagnosis not present

## 2016-07-12 DIAGNOSIS — R262 Difficulty in walking, not elsewhere classified: Secondary | ICD-10-CM

## 2016-07-12 DIAGNOSIS — M6281 Muscle weakness (generalized): Secondary | ICD-10-CM

## 2016-07-12 NOTE — Patient Instructions (Signed)
Hip Extension (Prone)   Lift left leg __6__ inches from floor, keeping knee locked. Repeat 10-20____ times per set. Do __2__ sets per session. Do __2__ sessions per day.  http://orth.exer.us/98   Copyright  VHI. All rights reserved.  Hip Adduction: Leg Lift (Eccentric) - Side-Lying   Lie on side with top leg bent, foot flat behind lower leg. Quickly lift lower leg. Slowly lower for 3-5 seconds. _10-20__ reps per set, 2___ sets per day, _7__ days per week. Add ___ lbs when you achieve ___ repetitions.  Copyright  VHI. All rights reserved.  Abduction: Side Leg Lift (Eccentric) - Side-Lying   Lie on side. Lift top leg slightly higher than shoulder level. Keep top leg straight with body, toes pointing forward. Slowly lower for 3-5 seconds. _10-20__ reps per set, _2__ sets per day, _7__ days per week.  Strengthening: Straight Leg Raise (Phase 1)   Tighten muscles on front of right thigh, then lift leg ___12_ inches from surface, keeping knee locked.  Repeat 10-20____ times per set. Do __2__ sets per session. Do _2___ sessions per day.  http://orth.exer.us/614   Copyright  VHI. All rights reserved.

## 2016-07-12 NOTE — Therapy (Signed)
[place], [place], [address] Phone: [phone]   Fax:  [phone]  Physical Therapy Treatment  Patient Details  Name: [name] MRN: [id number] Date of Birth: [birth year] Referring Provider: Dr Jeannine Boga  Encounter Date: 07/12/2016      PT End of Session - 07/12/16 0853    Visit Number 11   Number of Visits 16   Date for PT Re-Evaluation 07/30/16   Authorization Type [facility] UMR    PT Start Time 0845   PT Stop Time 0945   PT Time Calculation (min) 60 min      Past Medical History:  Diagnosis Date  . Anginal pain ([place])    chest pain  heart cath done  . Cancer ([place])   . Chronic bronchitis ([place])    "get it q yr"  . Fibromyalgia   . GERD (gastroesophageal reflux disease)   . Hepatic adenoma   . High cholesterol   . History of gout   . History of hiatal hernia   . Hypertension   . Hypothyroidism   . Iron deficiency anemia    "comes and goes"  . Kidney stones   . Migraine    "@ least once/month" (02/18/2015)  . PONV (postoperative nausea and vomiting)   . Proteinuria   . Tachycardia   . Type II diabetes mellitus ([place])     Past Surgical History:  Procedure Laterality Date  . ABDOMINAL HYSTERECTOMY  ~2012   "lap"  . ANKLE RECONSTRUCTION Left 03/29/2016   Procedure: LEFT ANKLE LATERAL LIGAMENT RECONSTRUCTION AND PERONEAL TENDON REPAIR OR TENOLYSIS;  Surgeon: Wylene Simmer, MD;  Location: [place];  Service: Orthopedics;  Laterality: Left;  . BACK SURGERY    . CARDIAC CATHETERIZATION  04/2009   "clean"  . COLONOSCOPY    . CYSTOSCOPY WITH URETEROSCOPY, STONE BASKETRY AND STENT PLACEMENT  ~ 2006  . ESOPHAGOGASTRODUODENOSCOPY    . LAPAROSCOPIC CHOLECYSTECTOMY  05/2004  . LUMBAR DISC SURGERY  08/2011  . THYROIDECTOMY N/A 02/18/2015   Procedure: TOTAL THYROIDECTOMY;  Surgeon: [name], MD;  Location: [place];  Service: General;  Laterality: N/A;  . TOTAL THYROIDECTOMY  02/18/2015     There were no vitals filed for this visit.      Subjective Assessment - 07/12/16 0853    Subjective I felt very unbalanced while walking in the pool    Pain Score 3    Pain Location Ankle   Pain Orientation Left   Pain Descriptors / Indicators Aching            [facility] PT Assessment - 07/12/16 0001      Strength   Strength Assessment Site Hip   Right/Left Hip Right;Left   Right Hip Flexion 4/5   Right Hip Extension 4+/5   Right Hip ABduction 4+/5   Left Hip Flexion 3+/5   Left Hip Extension 3+/5   Left Hip ABduction 3+/5                     [facility] Adult PT Treatment/Exercise - 07/12/16 0001      High Level Balance   High Level Balance Comments tandem trials, SLS trial (3 sec best), foam pad narrow balance with head turns and nods, marching on foam, alternating and unilateral step taps on and off foam pad, blue rocker board balance shoulder width x 1 minute     Knee/Hip Exercises: Supine   Straight Leg Raises --   Other Supine  Knee/Hip Exercises 4 way hip on mat 10 x 2 each way      Vasopneumatic   Number Minutes Vasopneumatic  15 minutes   Vasopnuematic Location  Ankle   Vasopneumatic Pressure Low   Vasopneumatic Temperature  32     Ankle Exercises: Aerobic   Stationary Bike Nustep L5 LE only x 7 minutes                PT Education - 07/12/16 1229    Education provided Yes   Education Details 4 way hip supine   Person(s) Educated Patient   Methods Explanation;Handout   Comprehension Verbalized understanding          PT Short Term Goals - 07/10/16 1127      PT SHORT TERM GOAL #1   Title Patients left figure eight edema will be > 1cm different then her right    Time 4   Period Weeks   Status On-going     PT SHORT TERM GOAL #2   Title Patient will demsotrate a 5 cm single leg stance time   Time 4   Period Weeks   Status On-going     PT SHORT TERM GOAL #3   Title Patient will ambulate 300' w/ no device and no walking boot     Baseline still uses device   Time 4   Period Weeks   Status Partially Met     PT SHORT TERM GOAL #4   Title Patient will demostrate 4/5 gross left andkle strength    Time 4   Period Weeks   Status On-going     PT SHORT TERM GOAL #5   Title Patient will be I w./ HEP for strength and ROM    Time 4   Period Weeks   Status Achieved           PT Long Term Goals - 07/05/16 1601      PT LONG TERM GOAL #1   Title Patient will ambualte 3000' with no significant pain in order to go shopping    Baseline ambulated in aldi's over 3000' but continued to have pain    Time 8   Period Weeks   Status New     PT LONG TERM GOAL #2   Title Patient will stand for 1 hour without increased pin in order to return to work    Baseline Stood about a 1/2 hour with some pain and swelling    Time 8   Period Weeks   Status New     PT LONG TERM GOAL #3   Title Patient will go up/down 8 steps with reciprocol gait pattern.    Baseline PT with left PF weakness 3-/5  unable to clear full AROM in standing   Time 8   Period Weeks   Status On-going     PT LONG TERM GOAL #4   Title Pt will be 2/10 or less in left lower extremity for all functional activities.   Baseline Pt 5/10 even post injection   Period Weeks   Status On-going     PT LONG TERM GOAL #5   Title Pt will negotiate steps independently without use of assistive device   Baseline has not started steps yet    Time 8   Period Weeks   Status Partially Met               Plan - 07/12/16 1224    Clinical Impression Statement Pt feels better today.  Challenged her with multiple balance exercises in parallel bars. Her SLS is 3 seconds at best on LLE. She complains of burning in lateral L hip with SLS exercises. Hip MMT reveals 3+/5 strength in left and 4+/5 grossly in right. Pt issued supine 4 way hip strengthening for HEP.    PT Next Visit Plan review 4 way hip, begin small step ups;  Continue working on gait activiy and balance;   continue with vasopnematic device to control swelling.    PT Home Exercise Plan 4 way ankle ROM, gastroc stretch; toe yoga, 4 way hip,       Patient will benefit from skilled therapeutic intervention in order to improve the following deficits and impairments:  Abnormal gait, Decreased activity tolerance, Decreased balance, Decreased mobility, Decreased knowledge of use of DME, Decreased endurance, Decreased coordination, Decreased skin integrity, Decreased strength, Impaired sensation, Impaired perceived functional ability, Pain  Visit Diagnosis: Pain in left ankle and joints of left foot  Difficulty in walking, not elsewhere classified  Localized edema  Stiffness of left ankle, not elsewhere classified  Muscle weakness (generalized)     Problem List Patient Active Problem List   Diagnosis Date Noted  . Cellulitis of toe of left foot 03/15/2016  . Toe ulcer ([place]) 03/15/2016  . Neoplasm of uncertain behavior of thyroid gland, right lobe 02/18/2015  . DIABETES MELLITUS, TYPE II 04/14/2009  . DIABETES MELLITUS, WITH RENAL COMPLICATIONS 72/82/0601  . HYPERLIPIDEMIA 04/14/2009  . OBESITY 04/14/2009  . ANXIETY DEPRESSION 04/14/2009  . HYPERTENSION 04/14/2009  . COPD 04/14/2009  . CHEST PAIN 04/14/2009  . TACHYCARDIA, HX OF 04/14/2009    Dorene Ar, PTA 07/12/2016, 12:36 PM  [facility] [address] [place], [place], [address] Phone: [phone]   Fax:  [phone]  Name: [name] MRN: [id number] Date of Birth: 05-11-1966

## 2016-07-17 ENCOUNTER — Ambulatory Visit: Payer: 59 | Admitting: Physical Therapy

## 2016-07-17 DIAGNOSIS — R600 Localized edema: Secondary | ICD-10-CM | POA: Diagnosis not present

## 2016-07-17 DIAGNOSIS — L719 Rosacea, unspecified: Secondary | ICD-10-CM | POA: Diagnosis not present

## 2016-07-17 DIAGNOSIS — M25672 Stiffness of left ankle, not elsewhere classified: Secondary | ICD-10-CM

## 2016-07-17 DIAGNOSIS — M25572 Pain in left ankle and joints of left foot: Secondary | ICD-10-CM | POA: Diagnosis not present

## 2016-07-17 DIAGNOSIS — R262 Difficulty in walking, not elsewhere classified: Secondary | ICD-10-CM

## 2016-07-17 DIAGNOSIS — M6281 Muscle weakness (generalized): Secondary | ICD-10-CM | POA: Diagnosis not present

## 2016-07-17 NOTE — Therapy (Signed)
[place], [place], [address] Phone: [phone]   Fax:  [phone]  Physical Therapy Treatment  Patient Details  Name: [name] MRN: [id number] Date of Birth: 1966/12/01 Referring Provider: Dr Jeannine Boga  Encounter Date: 07/17/2016      PT End of Session - 07/17/16 0906    Visit Number 12   Number of Visits 16   Date for PT Re-Evaluation 07/30/16   Authorization Type [facility] UMR    PT Start Time 0845   PT Stop Time 0940   PT Time Calculation (min) 55 min   Activity Tolerance Patient tolerated treatment well   Behavior During Therapy [facility] for tasks assessed/performed      Past Medical History:  Diagnosis Date  . Anginal pain ([place])    chest pain  heart cath done  . Cancer ([place])   . Chronic bronchitis ([place])    "get it q yr"  . Fibromyalgia   . GERD (gastroesophageal reflux disease)   . Hepatic adenoma   . High cholesterol   . History of gout   . History of hiatal hernia   . Hypertension   . Hypothyroidism   . Iron deficiency anemia    "comes and goes"  . Kidney stones   . Migraine    "@ least once/month" (02/18/2015)  . PONV (postoperative nausea and vomiting)   . Proteinuria   . Tachycardia   . Type II diabetes mellitus ([place])     Past Surgical History:  Procedure Laterality Date  . ABDOMINAL HYSTERECTOMY  ~2012   "lap"  . ANKLE RECONSTRUCTION Left 03/29/2016   Procedure: LEFT ANKLE LATERAL LIGAMENT RECONSTRUCTION AND PERONEAL TENDON REPAIR OR TENOLYSIS;  Surgeon: Wylene Simmer, MD;  Location: [place];  Service: Orthopedics;  Laterality: Left;  . BACK SURGERY    . CARDIAC CATHETERIZATION  04/2009   "clean"  . COLONOSCOPY    . CYSTOSCOPY WITH URETEROSCOPY, STONE BASKETRY AND STENT PLACEMENT  ~ 2006  . ESOPHAGOGASTRODUODENOSCOPY    . LAPAROSCOPIC CHOLECYSTECTOMY  05/2004  . LUMBAR DISC SURGERY  08/2011  . THYROIDECTOMY N/A 02/18/2015   Procedure: TOTAL THYROIDECTOMY;  Surgeon:  [name], MD;  Location: [place];  Service: General;  Laterality: N/A;  . TOTAL THYROIDECTOMY  02/18/2015    There were no vitals filed for this visit.      Subjective Assessment - 07/17/16 0851    Subjective Patient reports her hip has been sore. She was on her feet a lot over the weekend and had to put the boot back on on Saturday and Sunday. She has less pain this morning.    Pertinent History Ankle surgery 03/29/2016, Fibromyalgia, Tachycardia,    Limitations Standing;Walking;House hold activities   How long can you stand comfortably? > 30 minin the brace anfd the shoe    How long can you walk comfortably? with a cane limited community distances    Diagnostic tests Nothing post-op   Patient Stated Goals Patient would like to be able to walk with less pain    Currently in Pain? Yes   Pain Score 4    Pain Location Ankle   Pain Orientation Left   Pain Descriptors / Indicators Aching   Pain Type Chronic pain   Pain Onset More than a month ago   Pain Frequency Constant   Aggravating Factors  prolonged walking and standing    Pain Relieving Factors rest/ elevation    Effect of Pain on Daily Activities  unable to walk long distance    Multiple Pain Sites No                                 PT Education - 07/17/16 0907    Education provided Yes   Education Details continue with current exercises.    Person(s) Educated Patient   Methods Explanation;Handout   Comprehension Verbalized understanding          PT Short Term Goals - 07/10/16 1127      PT SHORT TERM GOAL #1   Title Patients left figure eight edema will be > 1cm different then her right    Time 4   Period Weeks   Status On-going     PT SHORT TERM GOAL #2   Title Patient will demsotrate a 5 cm single leg stance time   Time 4   Period Weeks   Status On-going     PT SHORT TERM GOAL #3   Title Patient will ambulate 300' w/ no device and no walking boot    Baseline still uses device    Time 4   Period Weeks   Status Partially Met     PT SHORT TERM GOAL #4   Title Patient will demostrate 4/5 gross left andkle strength    Time 4   Period Weeks   Status On-going     PT SHORT TERM GOAL #5   Title Patient will be I w./ HEP for strength and ROM    Time 4   Period Weeks   Status Achieved           PT Long Term Goals - 07/05/16 6503      PT LONG TERM GOAL #1   Title Patient will ambualte 3000' with no significant pain in order to go shopping    Baseline ambulated in aldi's over 3000' but continued to have pain    Time 8   Period Weeks   Status New     PT LONG TERM GOAL #2   Title Patient will stand for 1 hour without increased pin in order to return to work    Baseline Stood about a 1/2 hour with some pain and swelling    Time 8   Period Weeks   Status New     PT LONG TERM GOAL #3   Title Patient will go up/down 8 steps with reciprocol gait pattern.    Baseline PT with left PF weakness 3-/5  unable to clear full AROM in standing   Time 8   Period Weeks   Status On-going     PT LONG TERM GOAL #4   Title Pt will be 2/10 or less in left lower extremity for all functional activities.   Baseline Pt 5/10 even post injection   Period Weeks   Status On-going     PT LONG TERM GOAL #5   Title Pt will negotiate steps independently without use of assistive device   Baseline has not started steps yet    Time 8   Period Weeks   Status Partially Met               Plan - 07/17/16 0924    Clinical Impression Statement Patient was sore before her treatment but tolerated treatment well. her hip stregth s still limited. Her ankle was also stiff into dorsi flexion today. She started with her PROm just to neutral but was  able to achieve 5 degrees with stretching.    PT Frequency 2x / week   PT Duration 8 weeks   PT Treatment/Interventions ADLs/Self Care Home Management;Cryotherapy;Electrical Stimulation;Moist Heat;Therapeutic exercise;Therapeutic  activities;Functional mobility training;Stair training;Gait training;Ultrasound;Balance training;Patient/family education;Manual techniques;Passive range of motion;Scar mobilization;Vasopneumatic Device   PT Next Visit Plan review 4 way hip, begin small step ups;  Continue working on gait activiy and balance;  continue with vasopnematic device to control swelling.    PT Home Exercise Plan 4 way ankle ROM, gastroc stretch; toe yoga, 4 way hip,    Consulted and Agree with Plan of Care Patient      Patient will benefit from skilled therapeutic intervention in order to improve the following deficits and impairments:  Abnormal gait, Decreased activity tolerance, Decreased balance, Decreased mobility, Decreased knowledge of use of DME, Decreased endurance, Decreased coordination, Decreased skin integrity, Decreased strength, Impaired sensation, Impaired perceived functional ability, Pain  Visit Diagnosis: Difficulty in walking, not elsewhere classified  Pain in left ankle and joints of left foot  Localized edema  Stiffness of left ankle, not elsewhere classified  Muscle weakness (generalized)     Problem List Patient Active Problem List   Diagnosis Date Noted  . Cellulitis of toe of left foot 03/15/2016  . Toe ulcer ([place]) 03/15/2016  . Neoplasm of uncertain behavior of thyroid gland, right lobe 02/18/2015  . DIABETES MELLITUS, TYPE II 04/14/2009  . DIABETES MELLITUS, WITH RENAL COMPLICATIONS 53/79/4327  . HYPERLIPIDEMIA 04/14/2009  . OBESITY 04/14/2009  . ANXIETY DEPRESSION 04/14/2009  . HYPERTENSION 04/14/2009  . COPD 04/14/2009  . CHEST PAIN 04/14/2009  . TACHYCARDIA, HX OF 04/14/2009    Carney Living PT DPT  07/17/2016, 11:43 AM  [facility] [address] [place], [place], [address] Phone: [phone]   Fax:  [phone]  Name: YANINA KNUPP MRN: [id number] Date of Birth: [birth year]

## 2016-07-20 ENCOUNTER — Ambulatory Visit: Payer: 59 | Admitting: Physical Therapy

## 2016-07-20 DIAGNOSIS — R600 Localized edema: Secondary | ICD-10-CM | POA: Diagnosis not present

## 2016-07-20 DIAGNOSIS — M25672 Stiffness of left ankle, not elsewhere classified: Secondary | ICD-10-CM

## 2016-07-20 DIAGNOSIS — R262 Difficulty in walking, not elsewhere classified: Secondary | ICD-10-CM

## 2016-07-20 DIAGNOSIS — M25572 Pain in left ankle and joints of left foot: Secondary | ICD-10-CM

## 2016-07-20 DIAGNOSIS — M6281 Muscle weakness (generalized): Secondary | ICD-10-CM

## 2016-07-20 NOTE — Therapy (Signed)
[place], [place], [address] Phone: [phone]   Fax:  [phone]  Physical Therapy Treatment  Patient Details  Name: [name] MRN: [id number] Date of Birth: 06/27/1966 Referring Provider: Dr Jeannine Boga  Encounter Date: 07/20/2016      PT End of Session - 07/20/16 0806    Visit Number 13   Number of Visits 16   Date for PT Re-Evaluation 07/30/16   Authorization Type [facility] U[facility]    PT Start Time 0800   PT Stop Time 0845   PT Time Calculation (min) 45 min      Past Medical History:  Diagnosis Date  . Anginal pain ([place])    chest pain  heart cath done  . Cancer ([place])   . Chronic bronchitis ([place])    "get it q yr"  . Fibromyalgia   . GERD (gastroesophageal reflux disease)   . Hepatic adenoma   . High cholesterol   . History of gout   . History of hiatal hernia   . Hypertension   . Hypothyroidism   . Iron deficiency anemia    "comes and goes"  . Kidney stones   . Migraine    "@ least once/month" (02/18/2015)  . PONV (postoperative nausea and vomiting)   . Proteinuria   . Tachycardia   . Type II diabetes mellitus ([place])     Past Surgical History:  Procedure Laterality Date  . ABDOMINAL HYSTERECTOMY  ~2012   "lap"  . ANKLE RECONSTRUCTION Left 03/29/2016   Procedure: LEFT ANKLE LATERAL LIGAMENT RECONSTRUCTION AND PERONEAL TENDON REPAIR OR TENOLYSIS;  Surgeon: Wylene Simmer, MD;  Location: [place];  Service: Orthopedics;  Laterality: Left;  . BACK SURGERY    . CARDIAC CATHETERIZATION  04/2009   "clean"  . COLONOSCOPY    . CYSTOSCOPY WITH URETEROSCOPY, STONE BASKETRY AND STENT PLACEMENT  ~ 2006  . ESOPHAGOGASTRODUODENOSCOPY    . LAPAROSCOPIC CHOLECYSTECTOMY  05/2004  . LUMBAR DISC SURGERY  08/2011  . THYROIDECTOMY N/A 02/18/2015   Procedure: TOTAL THYROIDECTOMY;  Surgeon: [name], MD;  Location: [place];  Service: General;  Laterality: N/A;  . TOTAL THYROIDECTOMY  02/18/2015     There were no vitals filed for this visit.      Subjective Assessment - 07/20/16 0805    Subjective I almost fell getting out of my SUV, my husband caught me.    Currently in Pain? Yes   Pain Location Ankle  lateral lower leg   Pain Orientation Left   Pain Descriptors / Indicators Aching                         [facility] Adult PT Treatment/Exercise - 07/20/16 0001      High Level Balance   High Level Balance Comments SLS 3 sec best right, 2 sec best left, Alternating and unilateral step taps on 8 inch box CGA, gait with head turns and nods multiple tiral, high knee stepping multiple trials. Gait with stepping over tissue box multiple trials, turning 360 x 2 each no LOB, gait with pivot and turn x 4 no LOB     Knee/Hip Exercises: Machines for Strengthening   Cybex Leg Press 1 plate single and bilateral x 10 each, bilateral heel raises x 20      Manual Therapy   Manual Therapy Taping   Kinesiotex Ligament Correction     Kinesiotix   Ligament Correction Peroneal tendonitis tape along lateral  lower leg     Ankle Exercises: Aerobic   Stationary Bike Nustep L5 LE only x 7 minutes               PT Education - 07/20/16 0921    Education provided Yes   Person(s) Educated Patient   Methods Explanation;Handout   Comprehension Verbalized understanding          PT Short Term Goals - 07/10/16 1127      PT SHORT TERM GOAL #1   Title Patients left figure eight edema will be > 1cm different then her right    Time 4   Period Weeks   Status On-going     PT SHORT TERM GOAL #2   Title Patient will demsotrate a 5 cm single leg stance time   Time 4   Period Weeks   Status On-going     PT SHORT TERM GOAL #3   Title Patient will ambulate 300' w/ no device and no walking boot    Baseline still uses device   Time 4   Period Weeks   Status Partially Met     PT SHORT TERM GOAL #4   Title Patient will demostrate 4/5 gross left andkle strength    Time 4    Period Weeks   Status On-going     PT SHORT TERM GOAL #5   Title Patient will be I w./ HEP for strength and ROM    Time 4   Period Weeks   Status Achieved           PT Long Term Goals - 07/05/16 3235      PT LONG TERM GOAL #1   Title Patient will ambualte 3000' with no significant pain in order to go shopping    Baseline ambulated in aldi's over 3000' but continued to have pain    Time 8   Period Weeks   Status New     PT LONG TERM GOAL #2   Title Patient will stand for 1 hour without increased pin in order to return to work    Baseline Stood about a 1/2 hour with some pain and swelling    Time 8   Period Weeks   Status New     PT LONG TERM GOAL #3   Title Patient will go up/down 8 steps with reciprocol gait pattern.    Baseline PT with left PF weakness 3-/5  unable to clear full AROM in standing   Time 8   Period Weeks   Status On-going     PT LONG TERM GOAL #4   Title Pt will be 2/10 or less in left lower extremity for all functional activities.   Baseline Pt 5/10 even post injection   Period Weeks   Status On-going     PT LONG TERM GOAL #5   Title Pt will negotiate steps independently without use of assistive device   Baseline has not started steps yet    Time 8   Period Weeks   Status Partially Met               Plan - 07/20/16 1002    Clinical Impression Statement Gait with head turns CGA assist beginning with multiple staggers progressing to minimal, pt able to always correct independently. Pt c/o lateral foot , ankle and lower leg pain today. Trial of KT tape with pt reports decreased pain upon standing.    PT Next Visit Plan assess benefit of tape; review 4  way hip, begin small step ups;  Continue working on gait activiy and dynamic balance;  continue with vasopnematic device to control swelling.       Patient will benefit from skilled therapeutic intervention in order to improve the following deficits and impairments:  Abnormal gait, Decreased  activity tolerance, Decreased balance, Decreased mobility, Decreased knowledge of use of DME, Decreased endurance, Decreased coordination, Decreased skin integrity, Decreased strength, Impaired sensation, Impaired perceived functional ability, Pain  Visit Diagnosis: Difficulty in walking, not elsewhere classified  Pain in left ankle and joints of left foot  Localized edema  Muscle weakness (generalized)  Stiffness of left ankle, not elsewhere classified  Difficulty walking     Problem List Patient Active Problem List   Diagnosis Date Noted  . Cellulitis of toe of left foot 03/15/2016  . Toe ulcer ([place]) 03/15/2016  . Neoplasm of uncertain behavior of thyroid gland, right lobe 02/18/2015  . DIABETES MELLITUS, TYPE II 04/14/2009  . DIABETES MELLITUS, WITH RENAL COMPLICATIONS 38/23/5105  . HYPERLIPIDEMIA 04/14/2009  . OBESITY 04/14/2009  . ANXIETY DEPRESSION 04/14/2009  . HYPERTENSION 04/14/2009  . COPD 04/14/2009  . CHEST PAIN 04/14/2009  . TACHYCARDIA, HX OF 04/14/2009    Dorene Ar, PTA 07/20/2016, 10:06 AM  [facility] [address] [place], [place], [address] Phone: [phone]   Fax:  [phone]  Name: [name] MRN: [id number] Date of Birth: [birth year]

## 2016-07-24 ENCOUNTER — Ambulatory Visit: Payer: 59 | Admitting: Physical Therapy

## 2016-07-24 DIAGNOSIS — M25572 Pain in left ankle and joints of left foot: Secondary | ICD-10-CM | POA: Diagnosis not present

## 2016-07-24 DIAGNOSIS — R0609 Other forms of dyspnea: Secondary | ICD-10-CM | POA: Diagnosis not present

## 2016-07-24 DIAGNOSIS — R600 Localized edema: Secondary | ICD-10-CM | POA: Diagnosis not present

## 2016-07-24 DIAGNOSIS — E1129 Type 2 diabetes mellitus with other diabetic kidney complication: Secondary | ICD-10-CM | POA: Diagnosis not present

## 2016-07-24 DIAGNOSIS — J209 Acute bronchitis, unspecified: Secondary | ICD-10-CM | POA: Diagnosis not present

## 2016-07-24 DIAGNOSIS — Z6841 Body Mass Index (BMI) 40.0 and over, adult: Secondary | ICD-10-CM | POA: Diagnosis not present

## 2016-07-24 DIAGNOSIS — M6281 Muscle weakness (generalized): Secondary | ICD-10-CM

## 2016-07-24 DIAGNOSIS — M25672 Stiffness of left ankle, not elsewhere classified: Secondary | ICD-10-CM

## 2016-07-24 DIAGNOSIS — R110 Nausea: Secondary | ICD-10-CM | POA: Diagnosis not present

## 2016-07-24 DIAGNOSIS — R262 Difficulty in walking, not elsewhere classified: Secondary | ICD-10-CM | POA: Diagnosis not present

## 2016-07-24 DIAGNOSIS — J029 Acute pharyngitis, unspecified: Secondary | ICD-10-CM | POA: Diagnosis not present

## 2016-07-24 NOTE — Therapy (Signed)
[place], [place], [address] Phone: [phone]   Fax:  [phone]  Physical Therapy Treatment/ Re-cert  Patient Details  Name: [name] MRN: [id number] Date of Birth: 09/28/1966 Referring Provider: Dr Jeannine Boga  Encounter Date: 07/24/2016      PT End of Session - 07/24/16 0915    Visit Number 14   Number of Visits 28   Date for PT Re-Evaluation 09/04/16   Authorization Type [facility] UMR    PT Start Time 0845   PT Stop Time 0927   PT Time Calculation (min) 42 min   Activity Tolerance Patient tolerated treatment well   Behavior During Therapy [facility] for tasks assessed/performed      Past Medical History:  Diagnosis Date  . Anginal pain ([place])    chest pain  heart cath done  . Cancer ([place])   . Chronic bronchitis ([place])    "get it q yr"  . Fibromyalgia   . GERD (gastroesophageal reflux disease)   . Hepatic adenoma   . High cholesterol   . History of gout   . History of hiatal hernia   . Hypertension   . Hypothyroidism   . Iron deficiency anemia    "comes and goes"  . Kidney stones   . Migraine    "@ least once/month" (02/18/2015)  . PONV (postoperative nausea and vomiting)   . Proteinuria   . Tachycardia   . Type II diabetes mellitus ([place])     Past Surgical History:  Procedure Laterality Date  . ABDOMINAL HYSTERECTOMY  ~2012   "lap"  . ANKLE RECONSTRUCTION Left 03/29/2016   Procedure: LEFT ANKLE LATERAL LIGAMENT RECONSTRUCTION AND PERONEAL TENDON REPAIR OR TENOLYSIS;  Surgeon: Wylene Simmer, MD;  Location: [place];  Service: Orthopedics;  Laterality: Left;  . BACK SURGERY    . CARDIAC CATHETERIZATION  04/2009   "clean"  . COLONOSCOPY    . CYSTOSCOPY WITH URETEROSCOPY, STONE BASKETRY AND STENT PLACEMENT  ~ 2006  . ESOPHAGOGASTRODUODENOSCOPY    . LAPAROSCOPIC CHOLECYSTECTOMY  05/2004  . LUMBAR DISC SURGERY  08/2011  . THYROIDECTOMY N/A 02/18/2015   Procedure: TOTAL THYROIDECTOMY;   Surgeon: [name], MD;  Location: [place];  Service: General;  Laterality: N/A;  . TOTAL THYROIDECTOMY  02/18/2015    There were no vitals filed for this visit.       Subjective Assessment - 07/24/16 0855    Subjective Pateint reports she has been sore but she has been walking more. She can walk around the grocery store but she needs to take breaks. She can not bend to pick things off the floor yet. She is also having trouble getting in a nd out of her low car. Shecan do a few steps but her steps are limited.    Pertinent History Ankle surgery 03/29/2016, Fibromyalgia, Tachycardia,    How long can you stand comfortably? > 30 minin the brace anfd the shoe    How long can you walk comfortably? with a cane limited community distances    Diagnostic tests Nothing post-op   Patient Stated Goals Patient would like to be able to walk with less pain    Pain Score 4    Pain Location Ankle   Pain Orientation Left   Pain Descriptors / Indicators Aching   Pain Type Chronic pain   Pain Onset More than a month ago   Pain Frequency Constant   Aggravating Factors  prolonged walking and standing  Pain Relieving Factors rest/ elevation    Effect of Pain on Daily Activities unable to walk long distances             [place] PT Assessment - 07/24/16 0001      Figure 8 Edema   Figure 8 - Right  49   Figure 8 - Left  56.2     AROM   Left Ankle Dorsiflexion 0     PROM   Left Ankle Dorsiflexion 5  8 degrees with knee flexed    Left Ankle Inversion 21   Left Ankle Eversion 13     Strength   Right Hip ABduction 4+/5   Left Hip Flexion 3+/5   Left Hip Extension 3+/5   Left Hip ABduction 3+/5                   [facility] Adult PT Treatment/Exercise - 07/24/16 0001      High Level Balance   High Level Balance Comments SLS 3 sec best right, 3 sec best left, Alternating and unilateral step taps on 8 inch box CGA, gait with head turns and nods multiple trial, high knee stepping multiple  trials., turning 360 x 2 each no LOB,      Knee/Hip Exercises: Machines for Strengthening   Cybex Leg Press 1 plate single and bilateral x 10 each, bilateral heel raises x 20      Knee/Hip Exercises: Supine   Straight Leg Raises Limitations 2x10 in low range      Knee/Hip Exercises: Sidelying   Hip ABduction Limitations 2x10      Ankle Exercises: Aerobic   Stationary Bike Nustep L5 LE only x 7 minutes                PT Education - 07/24/16 0912    Education provided Yes   Education Details continue with hip strengtheing/ ankle stretching/ and stregthening    Person(s) Educated Patient   Methods Explanation;Handout   Comprehension Verbalized understanding          PT Short Term Goals - 07/24/16 0952      PT SHORT TERM GOAL #1   Title Patients left figure eight edema will be < 1cm different then her right    Baseline still 5 cm difference in edema    Time 4   Period Weeks   Status On-going     PT SHORT TERM GOAL #2   Title Patient will demsotrate a 5 sec single leg stance time   Baseline 3 sevcond with bilateral upper extremity support    Time 4   Period Weeks   Status On-going     PT SHORT TERM GOAL #3   Title Patient will ambulate 300' w/ no device and no walking boot    Baseline still using a cane    Time 4   Period Weeks   Status Partially Met     PT SHORT TERM GOAL #4   Title Patient will demostrate 4/5 gross left andkle strength    Baseline still 2/5 PF and 3+/5 DF    Time 4   Period Weeks   Status On-going     PT SHORT TERM GOAL #5   Title Patient will be I w./ HEP for strength and ROM    Time 4   Period Weeks   Status Achieved           PT Long Term Goals - 07/24/16 0954      PT LONG TERM  GOAL #1   Title Patient will ambualte 3000' with no significant pain in order to go shopping    Baseline ambulated in aldi's over 3000' but continued to have pain and needs to stand on the right leg at times per patient to rest    Period Weeks    Status New     PT LONG TERM GOAL #2   Title Patient will stand for 1 hour without increased pin in order to return to work    Baseline Stood about a 1/2 hour with some pain and swelling    Time 8   Period Weeks   Status On-going     PT LONG TERM GOAL #3   Title Patient will go up/down 8 steps with reciprocol gait pattern.    Baseline PT with left PF weakness 3-/5  unable to clear full AROM in standing   Time 8   Period Weeks   Status On-going     PT LONG TERM GOAL #5   Title Pt will negotiate steps independently without use of assistive device   Baseline still requires device   Time 8   Period Weeks   Status Partially Met               Plan - 07/24/16 0950    Clinical Impression Statement Patient declined ice. She demsotrated improve strength and range of motion overall although she is still limited. She has stated doing some walking in the house without the cane with hand held assist. She would benfit from furter therapy to improve her ability to ambulate and transfer off low surfaces.    Rehab Potential Good   PT Frequency 2x / week   PT Duration 8 weeks   PT Treatment/Interventions ADLs/Self Care Home Management;Cryotherapy;Electrical Stimulation;Moist Heat;Therapeutic exercise;Therapeutic activities;Functional mobility training;Stair training;Gait training;Ultrasound;Balance training;Patient/family education;Manual techniques;Passive range of motion;Scar mobilization;Vasopneumatic Device   PT Next Visit Plan assess benefit of tape; review 4 way hip, begin small step ups;  Continue working on gait activiy and dynamic balance;  continue with vasopnematic device to control swelling.    PT Home Exercise Plan 4 way ankle ROM, gastroc stretch; toe yoga, 4 way hip,    Consulted and Agree with Plan of Care Patient      Patient will benefit from skilled therapeutic intervention in order to improve the following deficits and impairments:  Abnormal gait, Decreased activity  tolerance, Decreased balance, Decreased mobility, Decreased knowledge of use of DME, Decreased endurance, Decreased coordination, Decreased skin integrity, Decreased strength, Impaired sensation, Impaired perceived functional ability, Pain  Visit Diagnosis: Difficulty in walking, not elsewhere classified - Plan: PT plan of care cert/re-cert  Pain in left ankle and joints of left foot - Plan: PT plan of care cert/re-cert  Localized edema - Plan: PT plan of care cert/re-cert  Muscle weakness (generalized) - Plan: PT plan of care cert/re-cert  Stiffness of left ankle, not elsewhere classified - Plan: PT plan of care cert/re-cert     Problem List Patient Active Problem List   Diagnosis Date Noted  . Cellulitis of toe of left foot 03/15/2016  . Toe ulcer ([place]) 03/15/2016  . Neoplasm of uncertain behavior of thyroid gland, right lobe 02/18/2015  . DIABETES MELLITUS, TYPE II 04/14/2009  . DIABETES MELLITUS, WITH RENAL COMPLICATIONS 60/63/0160  . HYPERLIPIDEMIA 04/14/2009  . OBESITY 04/14/2009  . ANXIETY DEPRESSION 04/14/2009  . HYPERTENSION 04/14/2009  . COPD 04/14/2009  . CHEST PAIN 04/14/2009  . TACHYCARDIA, HX OF 04/14/2009    Shanon Brow  Robb Matar  PT DPT  07/24/2016, 3:14 PM  [facility] [address] [place], [place], [address] Phone: [phone]   Fax:  [phone]  Name: [name] MRN: [id number] Date of Birth: [birth year]

## 2016-07-25 DIAGNOSIS — M25572 Pain in left ankle and joints of left foot: Secondary | ICD-10-CM | POA: Diagnosis not present

## 2016-07-25 DIAGNOSIS — M25372 Other instability, left ankle: Secondary | ICD-10-CM | POA: Diagnosis not present

## 2016-07-26 ENCOUNTER — Ambulatory Visit: Payer: 59 | Admitting: Physical Therapy

## 2016-07-27 ENCOUNTER — Other Ambulatory Visit (HOSPITAL_COMMUNITY): Payer: Self-pay | Admitting: Pediatrics

## 2016-07-27 ENCOUNTER — Ambulatory Visit (HOSPITAL_COMMUNITY)
Admission: RE | Admit: 2016-07-27 | Discharge: 2016-07-27 | Disposition: A | Discharge status: Home / Self Care | Payer: 59 | Source: Ambulatory Visit | Attending: Pediatrics | Admitting: Pediatrics

## 2016-07-27 ENCOUNTER — Ambulatory Visit (HOSPITAL_COMMUNITY)
Admission: RE | Admit: 2016-07-27 | Discharge: 2016-07-27 | Disposition: A | Discharge status: Home / Self Care | Payer: 59 | Source: Ambulatory Visit | Attending: Gastroenterology | Admitting: Gastroenterology

## 2016-07-27 DIAGNOSIS — R1310 Dysphagia, unspecified: Secondary | ICD-10-CM | POA: Diagnosis not present

## 2016-07-27 NOTE — Progress Notes (Signed)
MBSS complete. Full report located under chart review in imaging section.  Izan Miron Paiewonsky, M.A. CCC-SLP [phone]  

## 2016-07-31 ENCOUNTER — Ambulatory Visit: Payer: 59 | Admitting: Physical Therapy

## 2016-07-31 DIAGNOSIS — M6281 Muscle weakness (generalized): Secondary | ICD-10-CM | POA: Diagnosis not present

## 2016-07-31 DIAGNOSIS — M25572 Pain in left ankle and joints of left foot: Secondary | ICD-10-CM | POA: Diagnosis not present

## 2016-07-31 DIAGNOSIS — R262 Difficulty in walking, not elsewhere classified: Secondary | ICD-10-CM | POA: Diagnosis not present

## 2016-07-31 DIAGNOSIS — M25672 Stiffness of left ankle, not elsewhere classified: Secondary | ICD-10-CM | POA: Diagnosis not present

## 2016-07-31 DIAGNOSIS — R600 Localized edema: Secondary | ICD-10-CM | POA: Diagnosis not present

## 2016-07-31 NOTE — Therapy (Signed)
[place], [place], [address] Phone: [phone]   Fax:  [phone]  Physical Therapy Treatment  Patient Details  Name: [name] MRN: [id number] Date of Birth: December 24, 1965 Referring Provider: Dr Jeannine Boga  Encounter Date: 07/31/2016      PT End of Session - 07/31/16 0952    Visit Number 15   Number of Visits 28   Date for PT Re-Evaluation 09/04/16   Authorization Type [facility] U[facility]    PT Start Time 0846   PT Stop Time 0935   PT Time Calculation (min) 49 min      Past Medical History:  Diagnosis Date  . Anginal pain ([place])    chest pain  heart cath done  . Cancer ([place])   . Chronic bronchitis ([place])    "get it q yr"  . Fibromyalgia   . GERD (gastroesophageal reflux disease)   . Hepatic adenoma   . High cholesterol   . History of gout   . History of hiatal hernia   . Hypertension   . Hypothyroidism   . Iron deficiency anemia    "comes and goes"  . Kidney stones   . Migraine    "@ least once/month" (02/18/2015)  . PONV (postoperative nausea and vomiting)   . Proteinuria   . Tachycardia   . Type II diabetes mellitus ([place])     Past Surgical History:  Procedure Laterality Date  . ABDOMINAL HYSTERECTOMY  ~2012   "lap"  . ANKLE RECONSTRUCTION Left 03/29/2016   Procedure: LEFT ANKLE LATERAL LIGAMENT RECONSTRUCTION AND PERONEAL TENDON REPAIR OR TENOLYSIS;  Surgeon: Wylene Simmer, MD;  Location: [facility];  Service: Orthopedics;  Laterality: Left;  . BACK SURGERY    . CARDIAC CATHETERIZATION  04/2009   "clean"  . COLONOSCOPY    . CYSTOSCOPY WITH URETEROSCOPY, STONE BASKETRY AND STENT PLACEMENT  ~ 2006  . ESOPHAGOGASTRODUODENOSCOPY    . LAPAROSCOPIC CHOLECYSTECTOMY  05/2004  . LUMBAR DISC SURGERY  08/2011  . THYROIDECTOMY N/A 02/18/2015   Procedure: TOTAL THYROIDECTOMY;  Surgeon: [name], MD;  Location: [facility];  Service: General;  Laterality: N/A;  . TOTAL THYROIDECTOMY  02/18/2015     There were no vitals filed for this visit.      Subjective Assessment - 07/31/16 0849    Subjective I have been sick with severe bronchitis. I am hurting all over now due to all the coughing.   Currently in Pain? Yes   Pain Score 5    Pain Location Ankle   Pain Orientation Left   Pain Descriptors / Indicators Aching   Aggravating Factors  prolonged walking and standing   Pain Relieving Factors rest/elevation            [facility] PT Assessment - 07/31/16 0001      Strength   Right Hip External Rotation  5/5   Right Hip Internal Rotation 5/5   Left Hip Flexion 3+/5   Left Hip External Rotation 3+/5   Left Hip Internal Rotation 3+/5   Left Hip ABduction 4-/5                     [facility] Adult PT Treatment/Exercise - 07/31/16 0001      Knee/Hip Exercises: Supine   Straight Leg Raises 2 sets;10 reps   Straight Leg Raises Limitations 2#     Knee/Hip Exercises: Sidelying   Hip ABduction Limitations 2x10   2#   Clams 10 x2, also reverse  clams 10 x 2     Manual Therapy   Kinesiotex Ligament Correction     Kinesiotix   Ligament Correction Peroneal tendonitis tape along lateral lower leg             Balance Exercises - 07/31/16 0954      Balance Exercises: Standing   Tandem Gait Forward;5 reps   Retro Gait 5 reps   Sidestepping 5 reps   Turning Right;Left           PT Education - 07/31/16 0945    Education provided Yes   Education Details Clam and reverse clam   Person(s) Educated Patient   Methods Explanation;Handout   Comprehension Verbalized understanding          PT Short Term Goals - 07/24/16 0952      PT SHORT TERM GOAL #1   Title Patients left figure eight edema will be < 1cm different then her right    Baseline still 5 cm difference in edema    Time 4   Period Weeks   Status On-going     PT SHORT TERM GOAL #2   Title Patient will demsotrate a 5 sec single leg stance time   Baseline 3 sevcond with bilateral upper  extremity support    Time 4   Period Weeks   Status On-going     PT SHORT TERM GOAL #3   Title Patient will ambulate 300' w/ no device and no walking boot    Baseline still using a cane    Time 4   Period Weeks   Status Partially Met     PT SHORT TERM GOAL #4   Title Patient will demostrate 4/5 gross left andkle strength    Baseline still 2/5 PF and 3+/5 DF    Time 4   Period Weeks   Status On-going     PT SHORT TERM GOAL #5   Title Patient will be I w./ HEP for strength and ROM    Time 4   Period Weeks   Status Achieved           PT Long Term Goals - 07/24/16 1610      PT LONG TERM GOAL #1   Title Patient will ambualte 3000' with no significant pain in order to go shopping    Baseline ambulated in [facility] over 3000' but continued to have pain and needs to stand on the right leg at times per patient to rest    Period Weeks   Status New     PT LONG TERM GOAL #2   Title Patient will stand for 1 hour without increased pin in order to return to work    Baseline Stood about a 1/2 hour with some pain and swelling    Time 8   Period Weeks   Status On-going     PT LONG TERM GOAL #3   Title Patient will go up/down 8 steps with reciprocol gait pattern.    Baseline PT with left PF weakness 3-/5  unable to clear full AROM in standing   Time 8   Period Weeks   Status On-going     PT LONG TERM GOAL #5   Title Pt will negotiate steps independently without use of assistive device   Baseline still requires device   Time 8   Period Weeks   Status Partially Met               Plan - 07/31/16 0945  Clinical Impression Statement Pt enters with new MD order to continue PT. We challenged balance with tandem gait, reto gait, dance steps and pivot turns. Pt requires occassional UE touch to stabilize. Added resistance to SLR hip exercises. In supine, pt reports pain with internally rotating leg as well as the same pain in sitting which causes her to sit with hip/ knee and  ankle in ER. MMT revels 3+/5 hip ER and IR on the left and 5/5 on the right. We added sidelying hip IR and ER to HEP. Pt reports mild pain with IR exercise. Reapplied peroneal tendon tape as pt reports it was very helpful.    PT Next Visit Plan peroneal tendon  tape PRN; review sidelying clam and reverse clam( add resistance if toelrated) begin small step ups;  Continue working on gait activiy and dynamic balance; ice/vaso PRN      Patient will benefit from skilled therapeutic intervention in order to improve the following deficits and impairments:  Abnormal gait, Decreased activity tolerance, Decreased balance, Decreased mobility, Decreased knowledge of use of DME, Decreased endurance, Decreased coordination, Decreased skin integrity, Decreased strength, Impaired sensation, Impaired perceived functional ability, Pain  Visit Diagnosis: Difficulty in walking, not elsewhere classified  Pain in left ankle and joints of left foot  Localized edema  Muscle weakness (generalized)  Stiffness of left ankle, not elsewhere classified     Problem List Patient Active Problem List   Diagnosis Date Noted  . Cellulitis of toe of left foot 03/15/2016  . Toe ulcer ([place]) 03/15/2016  . Neoplasm of uncertain behavior of thyroid gland, right lobe 02/18/2015  . DIABETES MELLITUS, TYPE II 04/14/2009  . DIABETES MELLITUS, WITH RENAL COMPLICATIONS 41/44/3601  . HYPERLIPIDEMIA 04/14/2009  . OBESITY 04/14/2009  . ANXIETY DEPRESSION 04/14/2009  . HYPERTENSION 04/14/2009  . COPD 04/14/2009  . CHEST PAIN 04/14/2009  . TACHYCARDIA, HX OF 04/14/2009    Dorene Ar, PTA 07/31/2016, 9:56 AM  [facility] [address] [place], [place], [address] Phone: [phone]   Fax:  [phone]  Name: ALLINE PIO MRN: [id number] Date of Birth: 1966/07/10

## 2016-07-31 NOTE — Patient Instructions (Signed)
Clam    Lie on side, legs bent 90. Open top knee to ceiling, rotating leg outward. Touch toes to ankle of bottom leg. Close knees, rotating leg inward. Maintain hip position. Repeat _10x2___ times. Repeat on other side. Do __2__ sessions per day.

## 2016-08-02 ENCOUNTER — Ambulatory Visit: Payer: 59 | Admitting: Physical Therapy

## 2016-08-02 DIAGNOSIS — R262 Difficulty in walking, not elsewhere classified: Secondary | ICD-10-CM

## 2016-08-02 DIAGNOSIS — M25572 Pain in left ankle and joints of left foot: Secondary | ICD-10-CM

## 2016-08-02 DIAGNOSIS — R600 Localized edema: Secondary | ICD-10-CM

## 2016-08-02 DIAGNOSIS — M6281 Muscle weakness (generalized): Secondary | ICD-10-CM

## 2016-08-02 DIAGNOSIS — M25672 Stiffness of left ankle, not elsewhere classified: Secondary | ICD-10-CM | POA: Diagnosis not present

## 2016-08-02 NOTE — Therapy (Signed)
[place], [place], [address] Phone: [phone]   Fax:  [phone]  Physical Therapy Treatment  Patient Details  Name: [name] MRN: [id number] Date of Birth: 07-22-1966 Referring Provider: Dr Jeannine Boga  Encounter Date: 08/02/2016      PT End of Session - 08/02/16 0909    Visit Number 16   Number of Visits 28   Date for PT Re-Evaluation 09/04/16   Authorization Type [facility]    PT Start Time 0849   PT Stop Time 0938   PT Time Calculation (min) 49 min   Activity Tolerance Patient tolerated treatment well   Behavior During Therapy [facility] for tasks assessed/performed      Past Medical History:  Diagnosis Date  . Anginal pain ([place])    chest pain  heart cath done  . Cancer ([place])   . Chronic bronchitis ([place])    "get it q yr"  . Fibromyalgia   . GERD (gastroesophageal reflux disease)   . Hepatic adenoma   . High cholesterol   . History of gout   . History of hiatal hernia   . Hypertension   . Hypothyroidism   . Iron deficiency anemia    "comes and goes"  . Kidney stones   . Migraine    "@ least once/month" (02/18/2015)  . PONV (postoperative nausea and vomiting)   . Proteinuria   . Tachycardia   . Type II diabetes mellitus ([place])     Past Surgical History:  Procedure Laterality Date  . ABDOMINAL HYSTERECTOMY  ~2012   "lap"  . ANKLE RECONSTRUCTION Left 03/29/2016   Procedure: LEFT ANKLE LATERAL LIGAMENT RECONSTRUCTION AND PERONEAL TENDON REPAIR OR TENOLYSIS;  Surgeon: Wylene Simmer, MD;  Location: [facility];  Service: Orthopedics;  Laterality: Left;  . BACK SURGERY    . CARDIAC CATHETERIZATION  04/2009   "clean"  . COLONOSCOPY    . CYSTOSCOPY WITH URETEROSCOPY, STONE BASKETRY AND STENT PLACEMENT  ~ 2006  . ESOPHAGOGASTRODUODENOSCOPY    . LAPAROSCOPIC CHOLECYSTECTOMY  05/2004  . LUMBAR DISC SURGERY  08/2011  . THYROIDECTOMY N/A 02/18/2015   Procedure: TOTAL THYROIDECTOMY;  Surgeon:  [name], MD;  Location: [facility];  Service: General;  Laterality: N/A;  . TOTAL THYROIDECTOMY  02/18/2015    There were no vitals filed for this visit.      Subjective Assessment - 08/02/16 0907    Subjective Patient reports she was sore after the last visit but she feels like she is getting stronger. Her pain level today is about a 4/10    Pertinent History Ankle surgery 03/29/2016, Fibromyalgia, Tachycardia,    Limitations Standing;Walking;House hold activities   How long can you stand comfortably? > 30 minin the brace anfd the shoe    How long can you walk comfortably? with a cane limited community distances    Diagnostic tests Nothing post-op   Patient Stated Goals Patient would like to be able to walk with less pain    Currently in Pain? Yes   Pain Score 5    Pain Location Ankle   Pain Orientation Left   Pain Descriptors / Indicators Aching   Pain Type Chronic pain   Pain Onset More than a month ago   Pain Frequency Constant   Aggravating Factors  prolonged walking and standing    Pain Relieving Factors rest/ elevation    Effect of Pain on Daily Activities unable to walk long distances    Multiple Pain  Sites No                         [facility] Adult PT Treatment/Exercise - 08/02/16 0001      High Level Balance   High Level Balance Comments SLS 3 sec best right, 3 sec best left, Alternating and unilateral step taps on 8 inch box CGA, gait with head turns and nods multiple tiral, high knee stepping multiple trials., turning 360 x 2 each no LOB,      Knee/Hip Exercises: Machines for Strengthening   Cybex Leg Press 1 plate single and bilateral x 10 each, bilateral heel raises x 20      Knee/Hip Exercises: Supine   Straight Leg Raises 2 sets;10 reps   Straight Leg Raises Limitations 2#     Knee/Hip Exercises: Sidelying   Hip ABduction Limitations 2x10   2#   Clams 10 x2, also reverse clams 10 x 2     Manual Therapy   Manual Therapy Taping   Kinesiotex  Ligament Correction     Kinesiotix   Ligament Correction Peroneal tendonitis tape along lateral lower leg     Ankle Exercises: Aerobic   Stationary Bike Nustep L5 LE only x 7 minutes                PT Education - 08/02/16 0909    Education provided Yes   Education Details continues with hip and ankle strethening at home    Person(s) Educated Patient   Methods Explanation;Handout   Comprehension Verbalized understanding          PT Short Term Goals - 08/02/16 0912      PT SHORT TERM GOAL #1   Title Patients left figure eight edema will be < 1cm different then her right    Baseline still 5 cm difference in edema    Time 4   Period Weeks   Status On-going     PT SHORT TERM GOAL #2   Title Patient will demsotrate a 5 sec single leg stance time   Baseline 3 sevcond with bilateral upper extremity support    Time 4   Period Weeks     PT SHORT TERM GOAL #3   Title Patient will ambulate 300' w/ no device and no walking boot    Baseline still using a cane    Time 4   Period Weeks   Status Partially Met     PT SHORT TERM GOAL #4   Title Patient will demostrate 4/5 gross left andkle strength    Baseline still 2/5 PF and 3+/5 DF    Time 4   Period Weeks   Status On-going     PT SHORT TERM GOAL #5   Title Patient will be I w./ HEP for strength and ROM    Time 4   Period Weeks   Status Achieved     Additional Short Term Goals   Additional Short Term Goals Yes     PT SHORT TERM GOAL #6   Title Patient will increase left hip strength to 4+/5    Time 4   Period Weeks   Status New           PT Long Term Goals - 07/24/16 1610      PT LONG TERM GOAL #1   Title Patient will ambualte 3000' with no significant pain in order to go shopping    Baseline ambulated in [facility] over 3000' but continued to  have pain and needs to stand on the right leg at times per patient to rest    Period Weeks   Status New     PT LONG TERM GOAL #2   Title Patient will stand for  1 hour without increased pin in order to return to work    Baseline Stood about a 1/2 hour with some pain and swelling    Time 8   Period Weeks   Status On-going     PT LONG TERM GOAL #3   Title Patient will go up/down 8 steps with reciprocol gait pattern.    Baseline PT with left PF weakness 3-/5  unable to clear full AROM in standing   Time 8   Period Weeks   Status On-going     PT LONG TERM GOAL #5   Title Pt will negotiate steps independently without use of assistive device   Baseline still requires device   Time 8   Period Weeks   Status Partially Met               Plan - 08/02/16 0910    Clinical Impression Statement Patient tolerated treatment well. She had some pain with hip activity but she was able to do the exercises better today. Patient 5 minutes late 2nd to [place] other visits.    Rehab Potential Good   PT Frequency 2x / week   PT Duration 8 weeks   PT Treatment/Interventions ADLs/Self Care Home Management;Cryotherapy;Electrical Stimulation;Moist Heat;Therapeutic exercise;Therapeutic activities;Functional mobility training;Stair training;Gait training;Ultrasound;Balance training;Patient/family education;Manual techniques;Passive range of motion;Scar mobilization;Vasopneumatic Device   PT Next Visit Plan peroneal tendon  tape PRN; review sidelying clam and reverse clam( add resistance if toelrated) begin small step ups;  Continue working on gait activiy and dynamic balance; ice/vaso PRN   PT Home Exercise Plan 4 way ankle ROM, gastroc stretch; toe yoga, 4 way hip,    Consulted and Agree with Plan of Care Patient      Patient will benefit from skilled therapeutic intervention in order to improve the following deficits and impairments:  Abnormal gait, Decreased activity tolerance, Decreased balance, Decreased mobility, Decreased knowledge of use of DME, Decreased endurance, Decreased coordination, Decreased skin integrity, Decreased strength, Impaired  sensation, Impaired perceived functional ability, Pain  Visit Diagnosis: Difficulty in walking, not elsewhere classified  Pain in left ankle and joints of left foot  Localized edema  Muscle weakness (generalized)  Stiffness of left ankle, not elsewhere classified  Difficulty walking     Problem List Patient Active Problem List   Diagnosis Date Noted  . Cellulitis of toe of left foot 03/15/2016  . Toe ulcer ([place]) 03/15/2016  . Neoplasm of uncertain behavior of thyroid gland, right lobe 02/18/2015  . DIABETES MELLITUS, TYPE II 04/14/2009  . DIABETES MELLITUS, WITH RENAL COMPLICATIONS 94/50/3888  . HYPERLIPIDEMIA 04/14/2009  . OBESITY 04/14/2009  . ANXIETY DEPRESSION 04/14/2009  . HYPERTENSION 04/14/2009  . COPD 04/14/2009  . CHEST PAIN 04/14/2009  . TACHYCARDIA, HX OF 04/14/2009    Carney Living PT DPT  08/02/2016, 12:53 PM  [facility] [address] [place], [place], [address] Phone: [phone]   Fax:  [phone]  Name: [name] MRN: [id number] Date of Birth: [birth year]

## 2016-08-07 ENCOUNTER — Ambulatory Visit: Payer: 59 | Admitting: Physical Therapy

## 2016-08-07 DIAGNOSIS — R262 Difficulty in walking, not elsewhere classified: Secondary | ICD-10-CM

## 2016-08-07 DIAGNOSIS — M25572 Pain in left ankle and joints of left foot: Secondary | ICD-10-CM | POA: Diagnosis not present

## 2016-08-07 DIAGNOSIS — M25672 Stiffness of left ankle, not elsewhere classified: Secondary | ICD-10-CM | POA: Diagnosis not present

## 2016-08-07 DIAGNOSIS — R600 Localized edema: Secondary | ICD-10-CM | POA: Diagnosis not present

## 2016-08-07 DIAGNOSIS — M6281 Muscle weakness (generalized): Secondary | ICD-10-CM | POA: Diagnosis not present

## 2016-08-07 NOTE — Therapy (Signed)
[facility] [address] [place], [place], [address] Phone: [phone]   Fax:  [phone]  Physical Therapy Treatment  Patient Details  Name: [name] MRN: [id number] Date of Birth: 1966-09-16 Referring Provider: Dr Doran Clay  Encounter Date: 08/07/2016      PT End of Session - 08/07/16 0846    Visit Number 17   Number of Visits 28   Date for PT Re-Evaluation 09/04/16   Authorization Type [facility] U[facility]    PT Start Time 0844   PT Stop Time 0945   PT Time Calculation (min) 61 min      Past Medical History:  Diagnosis Date  . Anginal pain ([facility])    chest pain  heart cath done  . Cancer ([facility])   . Chronic bronchitis ([facility])    "get it q yr"  . Fibromyalgia   . GERD (gastroesophageal reflux disease)   . Hepatic adenoma   . High cholesterol   . History of gout   . History of hiatal hernia   . Hypertension   . Hypothyroidism   . Iron deficiency anemia    "comes and goes"  . Kidney stones   . Migraine    "@ least once/month" (02/18/2015)  . PONV (postoperative nausea and vomiting)   . Proteinuria   . Tachycardia   . Type II diabetes mellitus ([facility])     Past Surgical History:  Procedure Laterality Date  . ABDOMINAL HYSTERECTOMY  ~2012   "lap"  . ANKLE RECONSTRUCTION Left 03/29/2016   Procedure: LEFT ANKLE LATERAL LIGAMENT RECONSTRUCTION AND PERONEAL TENDON REPAIR OR TENOLYSIS;  Surgeon: Toni Arthurs, MD;  [facility];  Service: Orthopedics;  Laterality: Left;  . BACK SURGERY    . CARDIAC CATHETERIZATION  04/2009   "clean"  . COLONOSCOPY    . CYSTOSCOPY WITH URETEROSCOPY, STONE BASKETRY AND STENT PLACEMENT  ~ 2006  . ESOPHAGOGASTRODUODENOSCOPY    . LAPAROSCOPIC CHOLECYSTECTOMY  05/2004  . LUMBAR DISC SURGERY  08/2011  . THYROIDECTOMY N/A 02/18/2015   Procedure: TOTAL THYROIDECTOMY;  Surgeon: Darnell Level, MD;  Location: [facility];  Service: General;  Laterality: N/A;  . TOTAL THYROIDECTOMY  02/18/2015     There were no vitals filed for this visit.      Subjective Assessment - 08/07/16 0849    Subjective I have been paying for my stubborness. I went to [employer] for 1.5 hours without my brace.    Currently in Pain? Yes   Pain Score 4    Pain Location Ankle   Pain Orientation Left   Pain Descriptors / Indicators Aching                         [facility] Adult PT Treatment/Exercise - 08/07/16 0001      Ambulation/Gait   Stairs Yes   Stair Management Technique Two rails;Alternating pattern   Number of Stairs 8   Height of Stairs 6     Modalities   Modalities Cryotherapy     Cryotherapy   Number Minutes Cryotherapy 15 Minutes   Cryotherapy Location Ankle   Type of Cryotherapy Ice pack     Kinesiotix   Ligament Correction Peroneal tendonitis tape along lateral lower leg     Ankle Exercises: Aerobic   Stationary Bike Nustep L5 LE only x 7 minutes     Ankle Exercises: Standing   Rocker Board 3 minutes   Rebounder right toe touch, then tandem with CGA/ min  assist    Heel Raises 20 reps   Toe Raise 20 reps   Other Standing Ankle Exercises step ups 6 inch 10 reps bilateral hand rails     Ankle Exercises: Seated   Toe Raise 20 reps  3#   Heel Slides 20 reps  3#                  PT Short Term Goals - 08/07/16 1313      PT SHORT TERM GOAL #1   Title Patients left figure eight edema will be < 1cm different then her right    Baseline still 5 cm difference in edema    Time 4   Period Weeks   Status On-going     PT SHORT TERM GOAL #2   Title Patient will demsotrate a 5 sec single leg stance time   Baseline 3 sevcond with bilateral upper extremity support    Time 4   Period Weeks   Status On-going     PT SHORT TERM GOAL #3   Title Patient will ambulate 300' w/ no device and no walking boot    Baseline still using a cane    Time 4   Period Weeks   Status Partially Met     PT SHORT TERM GOAL #4   Title Patient will demostrate 4/5 gross  left andkle strength    Baseline still 2/5 PF and 3+/5 DF    Time 4   Period Weeks   Status On-going     PT SHORT TERM GOAL #5   Title Patient will be I w./ HEP for strength and ROM    Time 4   Period Weeks   Status Achieved     PT SHORT TERM GOAL #6   Title Patient will increase left hip strength to 4+/5    Time 4   Period Weeks   Status On-going           PT Long Term Goals - 08/07/16 1314      PT LONG TERM GOAL #1   Title Patient will ambualte 3000' with no significant pain in order to go shopping    Baseline ambulated in [employer] for 1.5 hours however significant increase in pain   Time 8   Period Weeks   Status On-going     PT LONG TERM GOAL #2   Title Patient will stand for 1 hour without increased pin in order to return to work    Time 8   Period Weeks   Status On-going     PT LONG TERM GOAL #3   Title Patient will go up/down 8 steps with reciprocol gait pattern.    Baseline able to to go up and down 8 stairs with reciprocal pattern and bilateral hand rails   Time 8   Period Weeks   Status Partially Met     PT LONG TERM GOAL #4   Title Pt will be 2/10 or less in left lower extremity for all functional activities.   Baseline 4/10 today   Time 8   Period Weeks   Status On-going     PT LONG TERM GOAL #5   Title Pt will negotiate steps independently without use of assistive device   Baseline requires bilateral handrails today   Time 8   Period Weeks   Status Partially Met               Plan - 08/07/16 1316    Clinical Impression  Statement Pt reports hip exercises are helping her hip pain. She is unable to perfrom active heel and toe raises in standing. Added resistance to seated heel and toe raises with good tolerance. Begain stairs with bilateral handrails. Pt able to negotiate reciprocally. LTG # 3, #5 partially met. She ambulated in [place] for 1.5 hours with significant increase in pain during and afterward.    PT Next Visit Plan continue  step ups and try 1 HR with reciprocal strairs; peroneal tendon  tape PRN; review sidelying clam and reverse clam( add resistance if toelrated);  Continue working on gait activiy and dynamic balance; ice/vaso PRN   PT Home Exercise Plan green band for seated PF /DF      Patient will benefit from skilled therapeutic intervention in order to improve the following deficits and impairments:  Abnormal gait, Decreased activity tolerance, Decreased balance, Decreased mobility, Decreased knowledge of use of DME, Decreased endurance, Decreased coordination, Decreased skin integrity, Decreased strength, Impaired sensation, Impaired perceived functional ability, Pain  Visit Diagnosis: Difficulty in walking, not elsewhere classified  Pain in left ankle and joints of left foot  Localized edema  Muscle weakness (generalized)  Stiffness of left ankle, not elsewhere classified     Problem List Patient Active Problem List   Diagnosis Date Noted  . Cellulitis of toe of left foot 03/15/2016  . Toe ulcer ([place]) 03/15/2016  . Neoplasm of uncertain behavior of thyroid gland, right lobe 02/18/2015  . DIABETES MELLITUS, TYPE II 04/14/2009  . DIABETES MELLITUS, WITH RENAL COMPLICATIONS 66/59/9357  . HYPERLIPIDEMIA 04/14/2009  . OBESITY 04/14/2009  . ANXIETY DEPRESSION 04/14/2009  . HYPERTENSION 04/14/2009  . COPD 04/14/2009  . CHEST PAIN 04/14/2009  . TACHYCARDIA, HX OF 04/14/2009    Dorene Ar, PTA 08/07/2016, 1:36 PM  [place], [place], [address] Phone: [phone]   Fax:  [phone]  Name: [name] MRN: [id number] Date of Birth: 1966-04-28

## 2016-08-09 ENCOUNTER — Ambulatory Visit: Payer: 59 | Admitting: Physical Therapy

## 2016-08-09 DIAGNOSIS — R262 Difficulty in walking, not elsewhere classified: Secondary | ICD-10-CM | POA: Diagnosis not present

## 2016-08-09 DIAGNOSIS — M25672 Stiffness of left ankle, not elsewhere classified: Secondary | ICD-10-CM | POA: Diagnosis not present

## 2016-08-09 DIAGNOSIS — M25572 Pain in left ankle and joints of left foot: Secondary | ICD-10-CM

## 2016-08-09 DIAGNOSIS — R600 Localized edema: Secondary | ICD-10-CM | POA: Diagnosis not present

## 2016-08-09 DIAGNOSIS — M6281 Muscle weakness (generalized): Secondary | ICD-10-CM

## 2016-08-09 NOTE — Therapy (Signed)
[place], [place], [address] Phone: [phone]   Fax:  [phone]  Physical Therapy Treatment  Patient Details  Name: [name] MRN: [id number] Date of Birth: 11/30/1966 Referring Provider: Dr Jeannine Boga  Encounter Date: 08/09/2016      PT End of Session - 08/09/16 0909    Visit Number 18   Number of Visits 28   Date for PT Re-Evaluation 09/04/16   Authorization Type [facility] U[facility]    PT Start Time 0845   PT Stop Time 0930   PT Time Calculation (min) 45 min      Past Medical History:  Diagnosis Date  . Anginal pain ([place])    chest pain  heart cath done  . Cancer ([place])   . Chronic bronchitis ([place])    "get it q yr"  . Fibromyalgia   . GERD (gastroesophageal reflux disease)   . Hepatic adenoma   . High cholesterol   . History of gout   . History of hiatal hernia   . Hypertension   . Hypothyroidism   . Iron deficiency anemia    "comes and goes"  . Kidney stones   . Migraine    "@ least once/month" (02/18/2015)  . PONV (postoperative nausea and vomiting)   . Proteinuria   . Tachycardia   . Type II diabetes mellitus ([place])     Past Surgical History:  Procedure Laterality Date  . ABDOMINAL HYSTERECTOMY  ~2012   "lap"  . ANKLE RECONSTRUCTION Left 03/29/2016   Procedure: LEFT ANKLE LATERAL LIGAMENT RECONSTRUCTION AND PERONEAL TENDON REPAIR OR TENOLYSIS;  Surgeon: Wylene Simmer, MD;  Location: [facility];  Service: Orthopedics;  Laterality: Left;  . BACK SURGERY    . CARDIAC CATHETERIZATION  04/2009   "clean"  . COLONOSCOPY    . CYSTOSCOPY WITH URETEROSCOPY, STONE BASKETRY AND STENT PLACEMENT  ~ 2006  . ESOPHAGOGASTRODUODENOSCOPY    . LAPAROSCOPIC CHOLECYSTECTOMY  05/2004  . LUMBAR DISC SURGERY  08/2011  . THYROIDECTOMY N/A 02/18/2015   Procedure: TOTAL THYROIDECTOMY;  Surgeon: [name], MD;  Location: [facility];  Service: General;  Laterality: N/A;  . TOTAL THYROIDECTOMY  02/18/2015     There were no vitals filed for this visit.      Subjective Assessment - 08/09/16 0909    Currently in Pain? Yes            [facility] PT Assessment - 08/09/16 0001      AROM   Left Ankle Dorsiflexion -5     PROM   Left Ankle Dorsiflexion 5     Strength   Left Ankle Dorsiflexion 3+/5   Left Ankle Plantar Flexion 2+/5   Left Ankle Inversion 2+/5   Left Ankle Eversion 2+/5                     [facility] Adult PT Treatment/Exercise - 08/09/16 0001      Knee/Hip Exercises: Machines for Strengthening   Cybex Leg Press 2 plate single and bilateral x 10 each, bilateral heel raises x 20 1 plate     Ankle Exercises: Sidelying   Ankle Inversion 20 reps   Ankle Eversion 20 reps     Ankle Exercises: Seated   Toe Raise 20 reps  3#   Heel Slides 20 reps  3#     Ankle Exercises: Aerobic   Stationary Bike Nustep L5 LE only x 11 minutes  PT Short Term Goals - 08/07/16 1313      PT SHORT TERM GOAL #1   Title Patients left figure eight edema will be < 1cm different then her right    Baseline still 5 cm difference in edema    Time 4   Period Weeks   Status On-going     PT SHORT TERM GOAL #2   Title Patient will demsotrate a 5 sec single leg stance time   Baseline 3 sevcond with bilateral upper extremity support    Time 4   Period Weeks   Status On-going     PT SHORT TERM GOAL #3   Title Patient will ambulate 300' w/ no device and no walking boot    Baseline still using a cane    Time 4   Period Weeks   Status Partially Met     PT SHORT TERM GOAL #4   Title Patient will demostrate 4/5 gross left andkle strength    Baseline still 2/5 PF and 3+/5 DF    Time 4   Period Weeks   Status On-going     PT SHORT TERM GOAL #5   Title Patient will be I w./ HEP for strength and ROM    Time 4   Period Weeks   Status Achieved     PT SHORT TERM GOAL #6   Title Patient will increase left hip strength to 4+/5    Time 4   Period Weeks    Status On-going           PT Long Term Goals - 08/07/16 1314      PT LONG TERM GOAL #1   Title Patient will ambualte 3000' with no significant pain in order to go shopping    Baseline ambulated in [employer] for 1.5 hours however significant increase in pain   Time 8   Period Weeks   Status On-going     PT LONG TERM GOAL #2   Title Patient will stand for 1 hour without increased pin in order to return to work    Time 8   Period Weeks   Status On-going     PT LONG TERM GOAL #3   Title Patient will go up/down 8 steps with reciprocol gait pattern.    Baseline able to to go up and down 8 stairs with reciprocal pattern and bilateral hand rails   Time 8   Period Weeks   Status Partially Met     PT LONG TERM GOAL #4   Title Pt will be 2/10 or less in left lower extremity for all functional activities.   Baseline 4/10 today   Time 8   Period Weeks   Status On-going     PT LONG TERM GOAL #5   Title Pt will negotiate steps independently without use of assistive device   Baseline requires bilateral handrails today   Time 8   Period Weeks   Status Partially Met               Plan - 08/09/16 1529    Clinical Impression Statement Continued weakness in left ankle causing difficulties with balance. We reviewed sitting ankle exercises with theraband verses wt. Also tried sidelying ankle inversion and eversion with good ROM. Asked pt to keep her brace unless she is inside her home. Pt to ask MD for custom orthotic referral.    PT Next Visit Plan continue step ups and try 1 HR with reciprocal strairs; peroneal tendon  tape PRN; review sidelying clam and reverse clam( add resistance if toelrated);  Continue working on gait activiy and balance; ice/vaso PRN   PT Home Exercise Plan continue 4 way ankle       Patient will benefit from skilled therapeutic intervention in order to improve the following deficits and impairments:  Abnormal gait, Decreased activity tolerance, Decreased  balance, Decreased mobility, Decreased knowledge of use of DME, Decreased endurance, Decreased coordination, Decreased skin integrity, Decreased strength, Impaired sensation, Impaired perceived functional ability, Pain  Visit Diagnosis: No diagnosis found.     Problem List Patient Active Problem List   Diagnosis Date Noted  . Cellulitis of toe of left foot 03/15/2016  . Toe ulcer ([place]) 03/15/2016  . Neoplasm of uncertain behavior of thyroid gland, right lobe 02/18/2015  . DIABETES MELLITUS, TYPE II 04/14/2009  . DIABETES MELLITUS, WITH RENAL COMPLICATIONS 33/61/2244  . HYPERLIPIDEMIA 04/14/2009  . OBESITY 04/14/2009  . ANXIETY DEPRESSION 04/14/2009  . HYPERTENSION 04/14/2009  . COPD 04/14/2009  . CHEST PAIN 04/14/2009  . TACHYCARDIA, HX OF 04/14/2009    Dorene Ar, PTA 08/09/2016, 3:39 PM  [place], [place], [address] Phone: [phone]   Fax:  [phone]  Name: [name] MRN: [id number] Date of Birth: June 14, 1966

## 2016-08-14 ENCOUNTER — Encounter: Payer: Self-pay | Admitting: Physical Therapy

## 2016-08-14 ENCOUNTER — Ambulatory Visit: Payer: 59 | Attending: Endocrinology | Admitting: Physical Therapy

## 2016-08-14 DIAGNOSIS — R600 Localized edema: Secondary | ICD-10-CM | POA: Insufficient documentation

## 2016-08-14 DIAGNOSIS — M25572 Pain in left ankle and joints of left foot: Secondary | ICD-10-CM | POA: Insufficient documentation

## 2016-08-14 DIAGNOSIS — M6281 Muscle weakness (generalized): Secondary | ICD-10-CM | POA: Insufficient documentation

## 2016-08-14 DIAGNOSIS — M25672 Stiffness of left ankle, not elsewhere classified: Secondary | ICD-10-CM | POA: Insufficient documentation

## 2016-08-14 DIAGNOSIS — R262 Difficulty in walking, not elsewhere classified: Secondary | ICD-10-CM | POA: Diagnosis not present

## 2016-08-14 NOTE — Therapy (Signed)
[place], [place], [address] Phone: [phone]   Fax:  [phone]  Physical Therapy Treatment  Patient Details  Name: [name] MRN: [id number] Date of Birth: 29-Aug-1966 Referring Provider: Dr Jeannine Boga  Encounter Date: 08/14/2016      PT End of Session - 08/14/16 0807    Visit Number 19   Number of Visits 28   Date for PT Re-Evaluation 09/04/16   Authorization Type [facility] U[facility]    PT Start Time 0801   PT Stop Time 0846   PT Time Calculation (min) 45 min      Past Medical History:  Diagnosis Date  . Anginal pain ([place])    chest pain  heart cath done  . Cancer ([place])   . Chronic bronchitis ([place])    "get it q yr"  . Fibromyalgia   . GERD (gastroesophageal reflux disease)   . Hepatic adenoma   . High cholesterol   . History of gout   . History of hiatal hernia   . Hypertension   . Hypothyroidism   . Iron deficiency anemia    "comes and goes"  . Kidney stones   . Migraine    "@ least once/month" (02/18/2015)  . PONV (postoperative nausea and vomiting)   . Proteinuria   . Tachycardia   . Type II diabetes mellitus ([place])     Past Surgical History:  Procedure Laterality Date  . ABDOMINAL HYSTERECTOMY  ~2012   "lap"  . ANKLE RECONSTRUCTION Left 03/29/2016   Procedure: LEFT ANKLE LATERAL LIGAMENT RECONSTRUCTION AND PERONEAL TENDON REPAIR OR TENOLYSIS;  Surgeon: Wylene Simmer, MD;  Location: [facility];  Service: Orthopedics;  Laterality: Left;  . BACK SURGERY    . CARDIAC CATHETERIZATION  04/2009   "clean"  . COLONOSCOPY    . CYSTOSCOPY WITH URETEROSCOPY, STONE BASKETRY AND STENT PLACEMENT  ~ 2006  . ESOPHAGOGASTRODUODENOSCOPY    . LAPAROSCOPIC CHOLECYSTECTOMY  05/2004  . LUMBAR DISC SURGERY  08/2011  . THYROIDECTOMY N/A 02/18/2015   Procedure: TOTAL THYROIDECTOMY;  Surgeon: [name], MD;  Location: [facility];  Service: General;  Laterality: N/A;  . TOTAL THYROIDECTOMY  02/18/2015     There were no vitals filed for this visit.      Subjective Assessment - 08/14/16 0812    Currently in Pain? Yes   Pain Score 3    Pain Location Ankle   Pain Orientation Left   Aggravating Factors  walking and standing   Pain Relieving Factors rest elevation            [facility] PT Assessment - 08/14/16 0001      Figure 8 Edema   Figure 8 - Right  51 cm   Figure 8 - Left  54.5 cm                     [facility] Adult PT Treatment/Exercise - 08/14/16 0001      Ambulation/Gait   Ambulation/Gait Yes   Ambulation/Gait Assistance 4: Min guard   Ambulation Distance (Feet) 300 Feet   Assistive device None   Ambulation Surface Level;Indoor   Gait velocity 1.5 ft /sec     Ankle Exercises: Sidelying   Ankle Inversion 20 reps  1#   Ankle Eversion 20 reps  1#     Ankle Exercises: Aerobic   Stationary Bike Nustep L5 LE only x 7 minutes     Ankle Exercises: Standing   Rocker Board 3 minutes  Other Standing Ankle Exercises 4 inch step up x 10 lateral step up a 10 with 1 HR.      Ankle Exercises: Stretches   Slant Board Stretch 1 rep;60 seconds                  PT Short Term Goals - 08/14/16 1453      PT SHORT TERM GOAL #1   Title Patients left figure eight edema will be < 1cm different then her right    Time 4   Period Weeks   Status Achieved     PT SHORT TERM GOAL #2   Title Patient will demsotrate a 5 sec single leg stance time   Baseline 3 sevcond with bilateral upper extremity support    Time 4   Period Weeks   Status On-going     PT SHORT TERM GOAL #3   Title Patient will ambulate 300' w/ no device and no walking boot    Baseline still using a cane    Time 4   Period Weeks   Status Partially Met     PT SHORT TERM GOAL #4   Title Patient will demostrate 4/5 gross left andkle strength    Baseline still 2/5 PF and 3+/5 DF    Time 4   Period Weeks   Status On-going     PT SHORT TERM GOAL #5   Title Patient will be I w./ HEP for  strength and ROM    Time 4   Period Weeks   Status Achieved     PT SHORT TERM GOAL #6   Title Patient will increase left hip strength to 4+/5    Time 4   Period Weeks   Status Unable to assess           PT Long Term Goals - 08/07/16 1314      PT LONG TERM GOAL #1   Title Patient will ambualte 3000' with no significant pain in order to go shopping    Baseline ambulated in [employer] for 1.5 hours however significant increase in pain   Time 8   Period Weeks   Status On-going     PT LONG TERM GOAL #2   Title Patient will stand for 1 hour without increased pin in order to return to work    Time 8   Period Weeks   Status On-going     PT LONG TERM GOAL #3   Title Patient will go up/down 8 steps with reciprocol gait pattern.    Baseline able to to go up and down 8 stairs with reciprocal pattern and bilateral hand rails   Time 8   Period Weeks   Status Partially Met     PT LONG TERM GOAL #4   Title Pt will be 2/10 or less in left lower extremity for all functional activities.   Baseline 4/10 today   Time 8   Period Weeks   Status On-going     PT LONG TERM GOAL #5   Title Pt will negotiate steps independently without use of assistive device   Baseline requires bilateral handrails today   Time 8   Period Weeks   Status Partially Met               Plan - 08/14/16 1448    Clinical Impression Statement Pt requires CGA/Min assist with gait without AD. Pt demonstrates several episodes of LOB with gait today. She was able to self correct. Gait apeed  1.5 ft/sec. Continued focus on ankle strength in sitting and standing. Encouraged pt to bring her brace next visit and we may go outside on unlevel surfaces and curbs. Edema has decreased to 5cm less difference between right and left. STG#1 Met.    PT Next Visit Plan Go outside and check unlevel surfaces/curbs while wearing brace? step ups and gait training on stairs/  peroneal tendon tape as needed. gait and balance in open  and closed chain, recheck hip strength,    PT Home Exercise Plan continue 4 way ankle, SLS with UE suppport      Patient will benefit from skilled therapeutic intervention in order to improve the following deficits and impairments:  Abnormal gait, Decreased activity tolerance, Decreased balance, Decreased mobility, Decreased knowledge of use of DME, Decreased endurance, Decreased coordination, Decreased skin integrity, Decreased strength, Impaired sensation, Impaired perceived functional ability, Pain  Visit Diagnosis: Difficulty in walking, not elsewhere classified  Localized edema  Muscle weakness (generalized)  Pain in left ankle and joints of left foot  Stiffness of left ankle, not elsewhere classified     Problem List Patient Active Problem List   Diagnosis Date Noted  . Cellulitis of toe of left foot 03/15/2016  . Toe ulcer ([place]) 03/15/2016  . Neoplasm of uncertain behavior of thyroid gland, right lobe 02/18/2015  . DIABETES MELLITUS, TYPE II 04/14/2009  . DIABETES MELLITUS, WITH RENAL COMPLICATIONS 48/59/2763  . HYPERLIPIDEMIA 04/14/2009  . OBESITY 04/14/2009  . ANXIETY DEPRESSION 04/14/2009  . HYPERTENSION 04/14/2009  . COPD 04/14/2009  . CHEST PAIN 04/14/2009  . TACHYCARDIA, HX OF 04/14/2009    [name], PTA 08/14/2016, 3:04 PM  [place], [place], [address] Phone: [phone]   Fax:  [phone]  Name: [name] MRN: [id number] Date of Birth: Feb 05, 1966

## 2016-08-16 ENCOUNTER — Encounter: Payer: Self-pay | Admitting: Physical Therapy

## 2016-08-17 ENCOUNTER — Ambulatory Visit: Payer: 59 | Admitting: Physical Therapy

## 2016-08-17 DIAGNOSIS — M25672 Stiffness of left ankle, not elsewhere classified: Secondary | ICD-10-CM

## 2016-08-17 DIAGNOSIS — M25572 Pain in left ankle and joints of left foot: Secondary | ICD-10-CM | POA: Diagnosis not present

## 2016-08-17 DIAGNOSIS — Z6841 Body Mass Index (BMI) 40.0 and over, adult: Secondary | ICD-10-CM | POA: Diagnosis not present

## 2016-08-17 DIAGNOSIS — J040 Acute laryngitis: Secondary | ICD-10-CM | POA: Diagnosis not present

## 2016-08-17 DIAGNOSIS — R0600 Dyspnea, unspecified: Secondary | ICD-10-CM | POA: Diagnosis not present

## 2016-08-17 DIAGNOSIS — R600 Localized edema: Secondary | ICD-10-CM | POA: Diagnosis not present

## 2016-08-17 DIAGNOSIS — M6281 Muscle weakness (generalized): Secondary | ICD-10-CM

## 2016-08-17 DIAGNOSIS — R262 Difficulty in walking, not elsewhere classified: Secondary | ICD-10-CM

## 2016-08-17 DIAGNOSIS — E1129 Type 2 diabetes mellitus with other diabetic kidney complication: Secondary | ICD-10-CM | POA: Diagnosis not present

## 2016-08-17 NOTE — Therapy (Signed)
[place], [place], [address] Phone: [phone]   Fax:  [phone]  Physical Therapy Treatment  Patient Details  Name: [name] MRN: [id number] Date of Birth: 09/06/1966 Referring Provider: Dr Jeannine Boga  Encounter Date: 08/17/2016      PT End of Session - 08/17/16 1256    Visit Number 20   Number of Visits 28   Date for PT Re-Evaluation 09/04/16   Authorization Type [facility] UMR    PT Start Time 0847   PT Stop Time 0933   PT Time Calculation (min) 46 min   Activity Tolerance Patient tolerated treatment well   Behavior During Therapy [facility] for tasks assessed/performed      Past Medical History:  Diagnosis Date  . Anginal pain ([place])    chest pain  heart cath done  . Cancer ([place])   . Chronic bronchitis ([place])    "get it q yr"  . Fibromyalgia   . GERD (gastroesophageal reflux disease)   . Hepatic adenoma   . High cholesterol   . History of gout   . History of hiatal hernia   . Hypertension   . Hypothyroidism   . Iron deficiency anemia    "comes and goes"  . Kidney stones   . Migraine    "@ least once/month" (02/18/2015)  . PONV (postoperative nausea and vomiting)   . Proteinuria   . Tachycardia   . Type II diabetes mellitus ([place])     Past Surgical History:  Procedure Laterality Date  . ABDOMINAL HYSTERECTOMY  ~2012   "lap"  . ANKLE RECONSTRUCTION Left 03/29/2016   Procedure: LEFT ANKLE LATERAL LIGAMENT RECONSTRUCTION AND PERONEAL TENDON REPAIR OR TENOLYSIS;  Surgeon: Wylene Simmer, MD;  Location: [facility];  Service: Orthopedics;  Laterality: Left;  . BACK SURGERY    . CARDIAC CATHETERIZATION  04/2009   "clean"  . COLONOSCOPY    . CYSTOSCOPY WITH URETEROSCOPY, STONE BASKETRY AND STENT PLACEMENT  ~ 2006  . ESOPHAGOGASTRODUODENOSCOPY    . LAPAROSCOPIC CHOLECYSTECTOMY  05/2004  . LUMBAR DISC SURGERY  08/2011  . THYROIDECTOMY N/A 02/18/2015   Procedure: TOTAL THYROIDECTOMY;  Surgeon:  [name], MD;  Location: [facility];  Service: General;  Laterality: N/A;  . TOTAL THYROIDECTOMY  02/18/2015    There were no vitals filed for this visit.      Subjective Assessment - 08/17/16 1253    Subjective Patient reports her ankle has been sore and swelling. she was emotional about her MD visit and a phone call with her insurance company. She has been doing her exercises.    Pertinent History Ankle surgery 03/29/2016, Fibromyalgia, Tachycardia,    Limitations Standing;Walking;House hold activities   How long can you stand comfortably? > 30 minin the brace anfd the shoe    How long can you walk comfortably? with a cane limited community distances    Diagnostic tests Nothing post-op   Patient Stated Goals Patient would like to be able to walk with less pain    Currently in Pain? Yes   Pain Score 7    Pain Location Ankle   Pain Orientation Left   Pain Descriptors / Indicators Aching   Pain Type Chronic pain   Pain Onset More than a month ago   Pain Frequency Constant   Aggravating Factors  walking and standing    Pain Relieving Factors rest, elevation    Effect of Pain on Daily Activities unnable to walk distances, unable to  step into a tub,                          [facility] Adult PT Treatment/Exercise - 08/17/16 0001      Modalities   Modalities Cryotherapy     Cryotherapy   Number Minutes Cryotherapy 15 Minutes   Cryotherapy Location Ankle   Type of Cryotherapy Ice pack     Manual Therapy   Manual therapy comments PROM into all planes; Anterior drawer glides to recue pain.      Ankle Exercises: Aerobic   Stationary Bike Nustep L5 LE only x 7 minutes     Ankle Exercises: Sidelying   Ankle Inversion 20 reps  1#   Ankle Eversion 20 reps  1#     Ankle Exercises: Standing   Heel Raises 20 reps   Toe Raise 20 reps   Other Standing Ankle Exercises standing march 2x10                  PT Education - 08/17/16 1254    Education provided Yes    Education Details extensive education on porgnosis and time frame that may be expected.    Person(s) Educated Patient   Methods Explanation   Comprehension Need further instruction          PT Short Term Goals - 08/14/16 1453      PT SHORT TERM GOAL #1   Title Patients left figure eight edema will be < 1cm different then her right    Time 4   Period Weeks   Status Achieved     PT SHORT TERM GOAL #2   Title Patient will demsotrate a 5 sec single leg stance time   Baseline 3 sevcond with bilateral upper extremity support    Time 4   Period Weeks   Status On-going     PT SHORT TERM GOAL #3   Title Patient will ambulate 300' w/ no device and no walking boot    Baseline still using a cane    Time 4   Period Weeks   Status Partially Met     PT SHORT TERM GOAL #4   Title Patient will demostrate 4/5 gross left andkle strength    Baseline still 2/5 PF and 3+/5 DF    Time 4   Period Weeks   Status On-going     PT SHORT TERM GOAL #5   Title Patient will be I w./ HEP for strength and ROM    Time 4   Period Weeks   Status Achieved     PT SHORT TERM GOAL #6   Title Patient will increase left hip strength to 4+/5    Time 4   Period Weeks   Status Unable to assess           PT Long Term Goals - 08/07/16 1314      PT LONG TERM GOAL #1   Title Patient will ambualte 3000' with no significant pain in order to go shopping    Baseline ambulated in [employer] for 1.5 hours however significant increase in pain   Time 8   Period Weeks   Status On-going     PT LONG TERM GOAL #2   Title Patient will stand for 1 hour without increased pin in order to return to work    Time 8   Period Weeks   Status On-going     PT LONG TERM GOAL #3  Title Patient will go up/down 8 steps with reciprocol gait pattern.    Baseline able to to go up and down 8 stairs with reciprocal pattern and bilateral hand rails   Time 8   Period Weeks   Status Partially Met     PT LONG TERM GOAL #4    Title Pt will be 2/10 or less in left lower extremity for all functional activities.   Baseline 4/10 today   Time 8   Period Weeks   Status On-going     PT LONG TERM GOAL #5   Title Pt will negotiate steps independently without use of assistive device   Baseline requires bilateral handrails today   Time 8   Period Weeks   Status Partially Met               Plan - 08/17/16 1302    Rehab Potential Good   PT Frequency 2x / week   PT Duration 8 weeks   PT Treatment/Interventions ADLs/Self Care Home Management;Cryotherapy;Electrical Stimulation;Moist Heat;Therapeutic exercise;Therapeutic activities;Functional mobility training;Stair training;Gait training;Ultrasound;Balance training;Patient/family education;Manual techniques;Passive range of motion;Scar mobilization;Vasopneumatic Device   PT Next Visit Plan Go outside and check unlevel surfaces/curbs while wearing brace? step ups and gait training on stairs/  peroneal tendon tape as needed. gait and balance in open and closed chain, recheck hip strength,    PT Home Exercise Plan continue 4 way ankle, SLS with UE suppport   Consulted and Agree with Plan of Care Patient      Patient will benefit from skilled therapeutic intervention in order to improve the following deficits and impairments:  Abnormal gait, Decreased activity tolerance, Decreased balance, Decreased mobility, Decreased knowledge of use of DME, Decreased endurance, Decreased coordination, Decreased skin integrity, Decreased strength, Impaired sensation, Impaired perceived functional ability, Pain  Visit Diagnosis: Pain in left ankle and joints of left foot  Difficulty in walking, not elsewhere classified  Localized edema  Muscle weakness (generalized)  Stiffness of left ankle, not elsewhere classified  Difficulty walking     Problem List Patient Active Problem List   Diagnosis Date Noted  . Cellulitis of toe of left foot 03/15/2016  . Toe ulcer ([place])  03/15/2016  . Neoplasm of uncertain behavior of thyroid gland, right lobe 02/18/2015  . DIABETES MELLITUS, TYPE II 04/14/2009  . DIABETES MELLITUS, WITH RENAL COMPLICATIONS 05/39/7673  . HYPERLIPIDEMIA 04/14/2009  . OBESITY 04/14/2009  . ANXIETY DEPRESSION 04/14/2009  . HYPERTENSION 04/14/2009  . COPD 04/14/2009  . CHEST PAIN 04/14/2009  . TACHYCARDIA, HX OF 04/14/2009    Carney Living PT DPT  08/17/2016, 1:07 PM  [facility] [address] [place], [place], [address] Phone: [phone]   Fax:  [phone]  Name: HELENA SARDO MRN: [id number] Date of Birth: [birth year]

## 2016-08-21 ENCOUNTER — Ambulatory Visit: Payer: 59 | Admitting: Physical Therapy

## 2016-08-21 DIAGNOSIS — M25572 Pain in left ankle and joints of left foot: Secondary | ICD-10-CM | POA: Diagnosis not present

## 2016-08-21 DIAGNOSIS — R600 Localized edema: Secondary | ICD-10-CM | POA: Diagnosis not present

## 2016-08-21 DIAGNOSIS — R262 Difficulty in walking, not elsewhere classified: Secondary | ICD-10-CM

## 2016-08-21 DIAGNOSIS — M25672 Stiffness of left ankle, not elsewhere classified: Secondary | ICD-10-CM | POA: Diagnosis not present

## 2016-08-21 DIAGNOSIS — M6281 Muscle weakness (generalized): Secondary | ICD-10-CM

## 2016-08-21 NOTE — Therapy (Signed)
[place], [place], [address] Phone: [phone]   Fax:  [phone]  Physical Therapy Treatment  Patient Details  Name: [name] MRN: [id number] Date of Birth: [birth year] Referring Provider: Dr Jeannine Boga  Encounter Date: 08/21/2016      PT End of Session - 08/21/16 1347    Visit Number 21   Number of Visits 28   Date for PT Re-Evaluation 09/04/16   Authorization Type [facility] U[facility]    PT Start Time 0845   PT Stop Time 0940   PT Time Calculation (min) 55 min   Activity Tolerance Patient tolerated treatment well   Behavior During Therapy [facility] for tasks assessed/performed      Past Medical History:  Diagnosis Date  . Anginal pain ([place])    chest pain  heart cath done  . Cancer ([place])   . Chronic bronchitis ([place])    "get it q yr"  . Fibromyalgia   . GERD (gastroesophageal reflux disease)   . Hepatic adenoma   . High cholesterol   . History of gout   . History of hiatal hernia   . Hypertension   . Hypothyroidism   . Iron deficiency anemia    "comes and goes"  . Kidney stones   . Migraine    "@ least once/month" (02/18/2015)  . PONV (postoperative nausea and vomiting)   . Proteinuria   . Tachycardia   . Type II diabetes mellitus ([place])     Past Surgical History:  Procedure Laterality Date  . ABDOMINAL HYSTERECTOMY  ~2012   "lap"  . ANKLE RECONSTRUCTION Left 03/29/2016   Procedure: LEFT ANKLE LATERAL LIGAMENT RECONSTRUCTION AND PERONEAL TENDON REPAIR OR TENOLYSIS;  Surgeon: Wylene Simmer, MD;  Location: [facility];  Service: Orthopedics;  Laterality: Left;  . BACK SURGERY    . CARDIAC CATHETERIZATION  04/2009   "clean"  . COLONOSCOPY    . CYSTOSCOPY WITH URETEROSCOPY, STONE BASKETRY AND STENT PLACEMENT  ~ 2006  . ESOPHAGOGASTRODUODENOSCOPY    . LAPAROSCOPIC CHOLECYSTECTOMY  05/2004  . LUMBAR DISC SURGERY  08/2011  . THYROIDECTOMY N/A 02/18/2015   Procedure: TOTAL THYROIDECTOMY;  Surgeon:  [name], MD;  Location: [facility];  Service: General;  Laterality: N/A;  . TOTAL THYROIDECTOMY  02/18/2015    There were no vitals filed for this visit.      Subjective Assessment - 08/21/16 1336    Subjective Patient still has concerns about her insurance company calling her. Front desk will look into her billing. She reports her ankle is still sore but it was no more sore then usual after the last visit.    Pertinent History Ankle surgery 03/29/2016, Fibromyalgia, Tachycardia,    Limitations Standing;Walking;House hold activities   How long can you stand comfortably? > 30 minin the brace anfd the shoe    How long can you walk comfortably? with a cane limited community distances    Diagnostic tests Nothing post-op   Patient Stated Goals Patient would like to be able to walk with less pain    Currently in Pain? Yes   Pain Score 5    Pain Location Ankle   Pain Orientation Left   Pain Descriptors / Indicators Aching   Pain Type Chronic pain   Pain Onset More than a month ago   Pain Frequency Constant   Aggravating Factors  walking and standing    Pain Relieving Factors rest, elevation    Effect of Pain on Daily Activities  uanble to walk distances.    Multiple Pain Sites No                         [facility] Adult PT Treatment/Exercise - 08/21/16 0001      Ambulation/Gait   Gait Comments ambulation in the bar without support. 10' x6 with patient counding by 2's. Improved gait pattern when patient was distracted; Marching 2'x10;      Knee/Hip Exercises: Standing   Forward Step Up Limitations 2x10 4"      Modalities   Modalities Cryotherapy     Cryotherapy   Number Minutes Cryotherapy 15 Minutes   Cryotherapy Location Ankle   Type of Cryotherapy Ice pack     Manual Therapy   Manual therapy comments PROM into all planes; Anterior drawer glides to recue pain.      Ankle Exercises: Aerobic   Stationary Bike Nustep L5 LE only x 5 minutes     Ankle Exercises:  Standing   Heel Raises 20 reps   Toe Raise 20 reps                PT Education - 08/21/16 1347    Education provided Yes   Education Details continued education on prognosis and long term exercise program.    Person(s) Educated Patient   Methods Explanation   Comprehension Verbalized understanding;Returned demonstration          PT Short Term Goals - 08/21/16 1356      PT SHORT TERM GOAL #1   Title Patients left figure eight edema will be < 1cm different then her right    Baseline still 5 cm difference in edema    Time 4   Period Weeks   Status Achieved     PT SHORT TERM GOAL #2   Title Patient will demsotrate a 5 sec single leg stance time   Baseline 3 sevcond with bilateral upper extremity support    Time 4   Period Weeks   Status On-going     PT SHORT TERM GOAL #3   Title Patient will ambulate 300' w/ no device and no walking boot    Baseline still using a cane    Time 4   Period Weeks   Status Partially Met     PT SHORT TERM GOAL #4   Title Patient will demostrate 4/5 gross left andkle strength    Baseline still 2/5 PF and 3+/5 DF    Time 4   Period Weeks   Status On-going     PT SHORT TERM GOAL #5   Title Patient will be I w./ HEP for strength and ROM    Time 4   Period Weeks   Status Achieved     PT SHORT TERM GOAL #6   Title Patient will increase left hip strength to 4+/5    Time 4   Period Weeks   Status On-going           PT Long Term Goals - 08/07/16 1314      PT LONG TERM GOAL #1   Title Patient will ambualte 3000' with no significant pain in order to go shopping    Baseline ambulated in [employer] for 1.5 hours however significant increase in pain   Time 8   Period Weeks   Status On-going     PT LONG TERM GOAL #2   Title Patient will stand for 1 hour without increased pin in order to return  to work    Time 8   Period Weeks   Status On-going     PT LONG TERM GOAL #3   Title Patient will go up/down 8 steps with reciprocol  gait pattern.    Baseline able to to go up and down 8 stairs with reciprocal pattern and bilateral hand rails   Time 8   Period Weeks   Status Partially Met     PT LONG TERM GOAL #4   Title Pt will be 2/10 or less in left lower extremity for all functional activities.   Baseline 4/10 today   Time 8   Period Weeks   Status On-going     PT LONG TERM GOAL #5   Title Pt will negotiate steps independently without use of assistive device   Baseline requires bilateral handrails today   Time 8   Period Weeks   Status Partially Met               Plan - 08/21/16 1349    Clinical Impression Statement Patient continues to have stiffness in the ankle. Therapy focused on gait training today. When she was distracted her gait pattern improved. She continues to toe out. Therapy also worked on increaseing her dorsiflexion. The patient would benefit from further therapy at this time.    Rehab Potential Good   PT Frequency 2x / week   PT Duration 8 weeks   PT Treatment/Interventions ADLs/Self Care Home Management;Cryotherapy;Electrical Stimulation;Moist Heat;Therapeutic exercise;Therapeutic activities;Functional mobility training;Stair training;Gait training;Ultrasound;Balance training;Patient/family education;Manual techniques;Passive range of motion;Scar mobilization;Vasopneumatic Device   PT Next Visit Plan Go outside and check unlevel surfaces/curbs while wearing brace? step ups and gait training on stairs/  peroneal tendon tape as needed. gait and balance in open and closed chain, recheck hip strength,    PT Home Exercise Plan continue 4 way ankle, SLS with UE suppport   Consulted and Agree with Plan of Care Patient      Patient will benefit from skilled therapeutic intervention in order to improve the following deficits and impairments:  Abnormal gait, Decreased activity tolerance, Decreased balance, Decreased mobility, Decreased knowledge of use of DME, Decreased endurance, Decreased  coordination, Decreased skin integrity, Decreased strength, Impaired sensation, Impaired perceived functional ability, Pain  Visit Diagnosis: Pain in left ankle and joints of left foot  Difficulty in walking, not elsewhere classified  Localized edema  Stiffness of left ankle, not elsewhere classified  Muscle weakness (generalized)  Difficulty walking     Problem List Patient Active Problem List   Diagnosis Date Noted  . Cellulitis of toe of left foot 03/15/2016  . Toe ulcer ([place]) 03/15/2016  . Neoplasm of uncertain behavior of thyroid gland, right lobe 02/18/2015  . DIABETES MELLITUS, TYPE II 04/14/2009  . DIABETES MELLITUS, WITH RENAL COMPLICATIONS 29/51/8841  . HYPERLIPIDEMIA 04/14/2009  . OBESITY 04/14/2009  . ANXIETY DEPRESSION 04/14/2009  . HYPERTENSION 04/14/2009  . COPD 04/14/2009  . CHEST PAIN 04/14/2009  . TACHYCARDIA, HX OF 04/14/2009    Carney Living PT DPT  08/21/2016, 5:37 PM  [facility] [address] [place], [place], [address] Phone: [phone]   Fax:  [phone]  Name: [name] MRN: [id number] Date of Birth: November 09, 1966

## 2016-08-23 ENCOUNTER — Ambulatory Visit: Payer: 59 | Admitting: Physical Therapy

## 2016-08-23 DIAGNOSIS — R600 Localized edema: Secondary | ICD-10-CM | POA: Diagnosis not present

## 2016-08-23 DIAGNOSIS — M25572 Pain in left ankle and joints of left foot: Secondary | ICD-10-CM

## 2016-08-23 DIAGNOSIS — M6281 Muscle weakness (generalized): Secondary | ICD-10-CM

## 2016-08-23 DIAGNOSIS — M25672 Stiffness of left ankle, not elsewhere classified: Secondary | ICD-10-CM

## 2016-08-23 DIAGNOSIS — R262 Difficulty in walking, not elsewhere classified: Secondary | ICD-10-CM | POA: Diagnosis not present

## 2016-08-23 NOTE — Therapy (Signed)
[place], [place], [address] Phone: [phone]   Fax:  [phone]  Physical Therapy Treatment  Patient Details  Name: [name] MRN: [id number] Date of Birth: 01-11-1966 Referring Provider: Dr Jeannine Boga  Encounter Date: 08/23/2016      PT End of Session - 08/23/16 1654    Visit Number 22   Number of Visits 28   Date for PT Re-Evaluation 09/04/16   Authorization Type [facility] U[facility]    PT Start Time 0304   PT Stop Time 0345   PT Time Calculation (min) 41 min      Past Medical History:  Diagnosis Date  . Anginal pain ([place])    chest pain  heart cath done  . Cancer ([place])   . Chronic bronchitis ([place])    "get it q yr"  . Fibromyalgia   . GERD (gastroesophageal reflux disease)   . Hepatic adenoma   . High cholesterol   . History of gout   . History of hiatal hernia   . Hypertension   . Hypothyroidism   . Iron deficiency anemia    "comes and goes"  . Kidney stones   . Migraine    "@ least once/month" (02/18/2015)  . PONV (postoperative nausea and vomiting)   . Proteinuria   . Tachycardia   . Type II diabetes mellitus ([place])     Past Surgical History:  Procedure Laterality Date  . ABDOMINAL HYSTERECTOMY  ~2012   "lap"  . ANKLE RECONSTRUCTION Left 03/29/2016   Procedure: LEFT ANKLE LATERAL LIGAMENT RECONSTRUCTION AND PERONEAL TENDON REPAIR OR TENOLYSIS;  Surgeon: Wylene Simmer, MD;  Location: [facility];  Service: Orthopedics;  Laterality: Left;  . BACK SURGERY    . CARDIAC CATHETERIZATION  04/2009   "clean"  . COLONOSCOPY    . CYSTOSCOPY WITH URETEROSCOPY, STONE BASKETRY AND STENT PLACEMENT  ~ 2006  . ESOPHAGOGASTRODUODENOSCOPY    . LAPAROSCOPIC CHOLECYSTECTOMY  05/2004  . LUMBAR DISC SURGERY  08/2011  . THYROIDECTOMY N/A 02/18/2015   Procedure: TOTAL THYROIDECTOMY;  Surgeon: [name], MD;  Location: [facility];  Service: General;  Laterality: N/A;  . TOTAL THYROIDECTOMY  02/18/2015     There were no vitals filed for this visit.      Subjective Assessment - 08/23/16 1705    Subjective I am frustrated    Pertinent History Ankle surgery 03/29/2016, Fibromyalgia, Tachycardia,    Currently in Pain? --  did not rate                         [facility] Adult PT Treatment/Exercise - 08/23/16 0001      High Level Balance   High Level Balance Comments 3 sec best , tandem 10 sec best     Therapeutic Activites    Therapeutic Activities ADL's   ADL's LE dressing able to demonstrate reaching her feet in sitting to get foot in pants, some difficulty getting foot through pant leg depending on the material, donning shoes- able to retrun demonstrate, discussed grab bar to get in and out of bathroom. Able to squat down and retrieve item from lower cabinet shelf while holding on to counter x 3       Knee/Hip Exercises: Standing   Functional Squat 10 reps   Other Standing Knee Exercises full squats holding counter x 10                  PT Short Term  Goals - 08/21/16 1356      PT SHORT TERM GOAL #1   Title Patients left figure eight edema will be < 1cm different then her right    Baseline still 5 cm difference in edema    Time 4   Period Weeks   Status Achieved     PT SHORT TERM GOAL #2   Title Patient will demsotrate a 5 sec single leg stance time   Baseline 3 sevcond with bilateral upper extremity support    Time 4   Period Weeks   Status On-going     PT SHORT TERM GOAL #3   Title Patient will ambulate 300' w/ no device and no walking boot    Baseline still using a cane    Time 4   Period Weeks   Status Partially Met     PT SHORT TERM GOAL #4   Title Patient will demostrate 4/5 gross left andkle strength    Baseline still 2/5 PF and 3+/5 DF    Time 4   Period Weeks   Status On-going     PT SHORT TERM GOAL #5   Title Patient will be I w./ HEP for strength and ROM    Time 4   Period Weeks   Status Achieved     PT SHORT TERM GOAL #6    Title Patient will increase left hip strength to 4+/5    Time 4   Period Weeks   Status On-going           PT Long Term Goals - 08/07/16 1314      PT LONG TERM GOAL #1   Title Patient will ambualte 3000' with no significant pain in order to go shopping    Baseline ambulated in [employer] for 1.5 hours however significant increase in pain   Time 8   Period Weeks   Status On-going     PT LONG TERM GOAL #2   Title Patient will stand for 1 hour without increased pin in order to return to work    Time 8   Period Weeks   Status On-going     PT LONG TERM GOAL #3   Title Patient will go up/down 8 steps with reciprocol gait pattern.    Baseline able to to go up and down 8 stairs with reciprocal pattern and bilateral hand rails   Time 8   Period Weeks   Status Partially Met     PT LONG TERM GOAL #4   Title Pt will be 2/10 or less in left lower extremity for all functional activities.   Baseline 4/10 today   Time 8   Period Weeks   Status On-going     PT LONG TERM GOAL #5   Title Pt will negotiate steps independently without use of assistive device   Baseline requires bilateral handrails today   Time 8   Period Weeks   Status Partially Met               Plan - 08/23/16 1655    Clinical Impression Statement Pt continues to be frustrated with insurance issues. We discussed her functional limitations including inablility to get in and out of shower without assistance. She is able to shower in standing while holding to soap dish in shower.  Discussed installing grab bar due to left ankle instability. She has difficulty donning shoes however she retruned demonstration of independence with donning shoes however she requires inreased time. She is also able  to perfrom LE dressing while sitting prior to rising and pulling her pants up.  We also began squats and full squats to get things out of lower cabinets and she can perfrom while holding edge of counter.    PT Next Visit Plan  Go outside and check unlevel surfaces/curbs while wearing brace? step ups and gait training on stairs/  peroneal tendon tape as needed. gait and balance in open and closed chain, recheck hip strength, , squats      Patient will benefit from skilled therapeutic intervention in order to improve the following deficits and impairments:  Abnormal gait, Decreased activity tolerance, Decreased balance, Decreased mobility, Decreased knowledge of use of DME, Decreased endurance, Decreased coordination, Decreased skin integrity, Decreased strength, Impaired sensation, Impaired perceived functional ability, Pain  Visit Diagnosis: Pain in left ankle and joints of left foot  Difficulty in walking, not elsewhere classified  Localized edema  Stiffness of left ankle, not elsewhere classified  Muscle weakness (generalized)     Problem List Patient Active Problem List   Diagnosis Date Noted  . Cellulitis of toe of left foot 03/15/2016  . Toe ulcer ([place]) 03/15/2016  . Neoplasm of uncertain behavior of thyroid gland, right lobe 02/18/2015  . DIABETES MELLITUS, TYPE II 04/14/2009  . DIABETES MELLITUS, WITH RENAL COMPLICATIONS 16/96/7893  . HYPERLIPIDEMIA 04/14/2009  . OBESITY 04/14/2009  . ANXIETY DEPRESSION 04/14/2009  . HYPERTENSION 04/14/2009  . COPD 04/14/2009  . CHEST PAIN 04/14/2009  . TACHYCARDIA, HX OF 04/14/2009    Dorene Ar, PTA 08/23/2016, 5:18 PM  [facility] [place], [place], [address] Phone: [phone]   Fax:  [phone]  Name: [name] MRN: [id number] Date of Birth: July 31, 1966

## 2016-08-28 ENCOUNTER — Ambulatory Visit: Payer: 59 | Admitting: Physical Therapy

## 2016-08-28 DIAGNOSIS — M25572 Pain in left ankle and joints of left foot: Secondary | ICD-10-CM

## 2016-08-28 DIAGNOSIS — R262 Difficulty in walking, not elsewhere classified: Secondary | ICD-10-CM | POA: Diagnosis not present

## 2016-08-28 DIAGNOSIS — M6281 Muscle weakness (generalized): Secondary | ICD-10-CM

## 2016-08-28 DIAGNOSIS — M25672 Stiffness of left ankle, not elsewhere classified: Secondary | ICD-10-CM | POA: Diagnosis not present

## 2016-08-28 DIAGNOSIS — R600 Localized edema: Secondary | ICD-10-CM | POA: Diagnosis not present

## 2016-08-29 NOTE — Therapy (Signed)
[place], [place], [address] Phone: [phone]   Fax:  [phone]  Physical Therapy Treatment  Patient Details  Name: [name] MRN: [id number] Date of Birth: June 30, 1966 Referring Provider: Dr Jeannine Boga  Encounter Date: 08/28/2016      PT End of Session - 08/28/16 0937    Visit Number 23   Number of Visits 28   Date for PT Re-Evaluation 09/04/16   Authorization Type [facility]    PT Start Time 0850   PT Stop Time 0945   PT Time Calculation (min) 55 min      Past Medical History:  Diagnosis Date  . Anginal pain ([place])    chest pain  heart cath done  . Cancer ([place])   . Chronic bronchitis ([place])    "get it q yr"  . Fibromyalgia   . GERD (gastroesophageal reflux disease)   . Hepatic adenoma   . High cholesterol   . History of gout   . History of hiatal hernia   . Hypertension   . Hypothyroidism   . Iron deficiency anemia    "comes and goes"  . Kidney stones   . Migraine    "@ least once/month" (02/18/2015)  . PONV (postoperative nausea and vomiting)   . Proteinuria   . Tachycardia   . Type II diabetes mellitus ([place])     Past Surgical History:  Procedure Laterality Date  . ABDOMINAL HYSTERECTOMY  ~2012   "lap"  . ANKLE RECONSTRUCTION Left 03/29/2016   Procedure: LEFT ANKLE LATERAL LIGAMENT RECONSTRUCTION AND PERONEAL TENDON REPAIR OR TENOLYSIS;  Surgeon: Wylene Simmer, MD;  Location: [facility];  Service: Orthopedics;  Laterality: Left;  . BACK SURGERY    . CARDIAC CATHETERIZATION  04/2009   "clean"  . COLONOSCOPY    . CYSTOSCOPY WITH URETEROSCOPY, STONE BASKETRY AND STENT PLACEMENT  ~ 2006  . ESOPHAGOGASTRODUODENOSCOPY    . LAPAROSCOPIC CHOLECYSTECTOMY  05/2004  . LUMBAR DISC SURGERY  08/2011  . THYROIDECTOMY N/A 02/18/2015   Procedure: TOTAL THYROIDECTOMY;  Surgeon: [name], MD;  Location: [facility];  Service: General;  Laterality: N/A;  . TOTAL THYROIDECTOMY  02/18/2015     There were no vitals filed for this visit.                       [facility] Adult PT Treatment/Exercise - 08/29/16 0001      High Level Balance   High Level Balance Comments 3 sec best , tandem 10 sec best     Lumbar Exercises: Seated   Sit to Stand 15 reps     Knee/Hip Exercises: Machines for Strengthening   Cybex Leg Press 2 plate single and bilateral x 10 each, bilateral heel raises x 20 1 plate     Manual Therapy   Manual therapy comments PROM into all planes;      Ankle Exercises: Standing   Rocker Board 3 minutes   Heel Raises 20 reps   Toe Raise 20 reps     Ankle Exercises: Aerobic   Stationary Bike Nustep L5 LE only x 7 min     Ankle Exercises: Seated   Towel Inversion/Eversion 5 reps   BAPS Level 2   Other Seated Ankle Exercises red/green band inversion and eversion              Balance Exercises - 08/28/16 1008      Balance Exercises: Standing   SLS Eyes open;5  reps  requires UE assist   Tandem Gait Forward;5 reps  also retro gait x 2   Retro Gait 5 reps           PT Education - 08/28/16 0936    Education provided Yes   Education Details Prepilates HEP   Person(s) Educated Patient   Methods Explanation;Handout   Comprehension Verbalized understanding          PT Short Term Goals - 08/21/16 1356      PT SHORT TERM GOAL #1   Title Patients left figure eight edema will be < 1cm different then her right    Baseline still 5 cm difference in edema    Time 4   Period Weeks   Status Achieved     PT SHORT TERM GOAL #2   Title Patient will demsotrate a 5 sec single leg stance time   Baseline 3 sevcond with bilateral upper extremity support    Time 4   Period Weeks   Status On-going     PT SHORT TERM GOAL #3   Title Patient will ambulate 300' w/ no device and no walking boot    Baseline still using a cane    Time 4   Period Weeks   Status Partially Met     PT SHORT TERM GOAL #4   Title Patient will demostrate 4/5  gross left andkle strength    Baseline still 2/5 PF and 3+/5 DF    Time 4   Period Weeks   Status On-going     PT SHORT TERM GOAL #5   Title Patient will be I w./ HEP for strength and ROM    Time 4   Period Weeks   Status Achieved     PT SHORT TERM GOAL #6   Title Patient will increase left hip strength to 4+/5    Time 4   Period Weeks   Status On-going           PT Long Term Goals - 08/28/16 1019      PT LONG TERM GOAL #1   Title Patient will ambualte 3000' with no significant pain in order to go shopping    Time 8   Period Weeks   Status Unable to assess     PT LONG TERM GOAL #2   Title Patient will stand for 1 hour without increased pin in order to return to work    Time 8   Period Weeks   Status Partially Met     PT LONG TERM GOAL #3   Title Patient will go up/down 8 steps with reciprocol gait pattern.    Time 8   Period Weeks   Status Partially Met     PT LONG TERM GOAL #4   Title Pt will be 2/10 or less in left lower extremity for all functional activities.   Baseline 2-3/10   Time 8   Period Weeks   Status On-going     PT LONG TERM GOAL #5   Title Pt will negotiate steps independently without use of assistive device   Baseline requires bilateral handrails    Time 8   Period Weeks   Status Partially Met               Plan - 08/28/16 0942    Clinical Impression Statement Pt can stand/walk for 1 hour prior to increased pain to grocerry shop without rest break. LTG# 2 partially met. Reviewed HEP including 4 way ankle  with theraband. Pt continues to lack active Inversion past neutral. Eversion slowly improving.    PT Next Visit Plan MEASURE ACTIVE VERSES PROM ANKLE ALL PLANES> step ups and gait training on stairs/  peroneal tendon tape as needed. gait and balance in open and closed chain, recheck hip strength, , squats      Patient will benefit from skilled therapeutic intervention in order to improve the following deficits and impairments:   Abnormal gait, Decreased activity tolerance, Decreased balance, Decreased mobility, Decreased knowledge of use of DME, Decreased endurance, Decreased coordination, Decreased skin integrity, Decreased strength, Impaired sensation, Impaired perceived functional ability, Pain  Visit Diagnosis: Pain in left ankle and joints of left foot  Difficulty in walking, not elsewhere classified  Stiffness of left ankle, not elsewhere classified  Muscle weakness (generalized)  Localized edema     Problem List Patient Active Problem List   Diagnosis Date Noted  . Cellulitis of toe of left foot 03/15/2016  . Toe ulcer ([place]) 03/15/2016  . Neoplasm of uncertain behavior of thyroid gland, right lobe 02/18/2015  . DIABETES MELLITUS, TYPE II 04/14/2009  . DIABETES MELLITUS, WITH RENAL COMPLICATIONS 29/03/7532  . HYPERLIPIDEMIA 04/14/2009  . OBESITY 04/14/2009  . ANXIETY DEPRESSION 04/14/2009  . HYPERTENSION 04/14/2009  . COPD 04/14/2009  . CHEST PAIN 04/14/2009  . TACHYCARDIA, HX OF 04/14/2009    Dorene Ar, PTA 08/29/2016, 10:44 AM  [facility] [place], [place], [address] Phone: [phone]   Fax:  [phone]  Name: [name] MRN: [id number] Date of Birth: May 16, 1966

## 2016-08-31 ENCOUNTER — Ambulatory Visit: Payer: 59 | Admitting: Physical Therapy

## 2016-08-31 ENCOUNTER — Encounter: Payer: Self-pay | Admitting: Podiatry

## 2016-08-31 ENCOUNTER — Ambulatory Visit (INDEPENDENT_AMBULATORY_CARE_PROVIDER_SITE_OTHER): Payer: 59 | Admitting: Podiatry

## 2016-08-31 DIAGNOSIS — M25572 Pain in left ankle and joints of left foot: Secondary | ICD-10-CM

## 2016-08-31 DIAGNOSIS — M25672 Stiffness of left ankle, not elsewhere classified: Secondary | ICD-10-CM

## 2016-08-31 DIAGNOSIS — Z6841 Body Mass Index (BMI) 40.0 and over, adult: Secondary | ICD-10-CM | POA: Diagnosis not present

## 2016-08-31 DIAGNOSIS — L84 Corns and callosities: Secondary | ICD-10-CM | POA: Diagnosis not present

## 2016-08-31 DIAGNOSIS — S40921A Unspecified superficial injury of right upper arm, initial encounter: Secondary | ICD-10-CM | POA: Diagnosis not present

## 2016-08-31 DIAGNOSIS — E1140 Type 2 diabetes mellitus with diabetic neuropathy, unspecified: Secondary | ICD-10-CM | POA: Diagnosis not present

## 2016-08-31 DIAGNOSIS — R262 Difficulty in walking, not elsewhere classified: Secondary | ICD-10-CM

## 2016-08-31 DIAGNOSIS — R600 Localized edema: Secondary | ICD-10-CM | POA: Diagnosis not present

## 2016-08-31 DIAGNOSIS — B359 Dermatophytosis, unspecified: Secondary | ICD-10-CM | POA: Diagnosis not present

## 2016-08-31 DIAGNOSIS — M6281 Muscle weakness (generalized): Secondary | ICD-10-CM | POA: Diagnosis not present

## 2016-08-31 NOTE — Therapy (Signed)
[place], [place], [address] Phone: [phone]   Fax:  [phone]  Physical Therapy Evaluation  Patient Details  Name: [name] MRN: [id number] Date of Birth: [birth year] Referring Provider: Dr Jeannine Boga  Encounter Date: 08/31/2016      PT End of Session - 08/31/16 1144    Visit Number 24   Number of Visits 28   Date for PT Re-Evaluation 09/21/16   Authorization Type [facility] UMR    PT Start Time 0847   PT Stop Time 0932   PT Time Calculation (min) 45 min   Activity Tolerance Patient tolerated treatment well   Behavior During Therapy [facility] for tasks assessed/performed      Past Medical History:  Diagnosis Date  . Anginal pain ([place])    chest pain  heart cath done  . Cancer ([place])   . Chronic bronchitis ([place])    "get it q yr"  . Fibromyalgia   . GERD (gastroesophageal reflux disease)   . Hepatic adenoma   . High cholesterol   . History of gout   . History of hiatal hernia   . Hypertension   . Hypothyroidism   . Iron deficiency anemia    "comes and goes"  . Kidney stones   . Migraine    "@ least once/month" (02/18/2015)  . PONV (postoperative nausea and vomiting)   . Proteinuria   . Tachycardia   . Type II diabetes mellitus ([place])     Past Surgical History:  Procedure Laterality Date  . ABDOMINAL HYSTERECTOMY  ~2012   "lap"  . ANKLE RECONSTRUCTION Left 03/29/2016   Procedure: LEFT ANKLE LATERAL LIGAMENT RECONSTRUCTION AND PERONEAL TENDON REPAIR OR TENOLYSIS;  Surgeon: Wylene Simmer, MD;  Location: [place];  Service: Orthopedics;  Laterality: Left;  . BACK SURGERY    . CARDIAC CATHETERIZATION  04/2009   "clean"  . COLONOSCOPY    . CYSTOSCOPY WITH URETEROSCOPY, STONE BASKETRY AND STENT PLACEMENT  ~ 2006  . ESOPHAGOGASTRODUODENOSCOPY    . LAPAROSCOPIC CHOLECYSTECTOMY  05/2004  . LUMBAR DISC SURGERY  08/2011  . THYROIDECTOMY N/A 02/18/2015   Procedure: TOTAL THYROIDECTOMY;  Surgeon:  [name], MD;  Location: [place];  Service: General;  Laterality: N/A;  . TOTAL THYROIDECTOMY  02/18/2015    There were no vitals filed for this visit.       Subjective Assessment - 08/31/16 0936    Subjective Patient feels like she is walking a little better today. She continues to have pain and instability when walking long distances    Pertinent History Ankle surgery 03/29/2016, Fibromyalgia, Tachycardia,    Limitations Standing;Walking;House hold activities   How long can you stand comfortably? > 30 minin the brace anfd the shoe    How long can you walk comfortably? with a cane limited community distances    Diagnostic tests Nothing post-op   Patient Stated Goals Patient would like to be able to walk with less pain    Currently in Pain? Yes   Pain Score 5    Pain Location Ankle   Pain Orientation Left   Pain Onset More than a month ago   Pain Frequency Constant   Aggravating Factors  walking and standing    Pain Relieving Factors rest, elevation    Effect of Pain on Daily Activities unable to walk distances             [facility] PT Assessment - 08/31/16 0001  PROM   Left Ankle Dorsiflexion 10   Left Ankle Plantar Flexion 37   Left Ankle Inversion 15   Left Ankle Eversion 19     Strength   Left Ankle Dorsiflexion 4/5   Left Ankle Plantar Flexion 2+/5   Left Ankle Inversion 2+/5   Left Ankle Eversion 3/5     High Level Balance   High Level Balance Comments 3 sec best , tandem 10 sec best                   [facility] Adult PT Treatment/Exercise - 08/31/16 0001      Knee/Hip Exercises: Supine   Bridges Limitations 2x10 with ankle dorsi flexion    Other Supine Knee/Hip Exercises supine clamshell 2x10 red      Manual Therapy   Manual therapy comments Talor Mobilizations grade 1 and II to improve dorsi flexion      Ankle Exercises: Seated   Towel Inversion/Eversion --  20x   Toe Raise 20 reps   Other Seated Ankle Exercises ankle 4 way 2x10 red cuing  for technique                 PT Education - 08/31/16 1142    Education provided Yes   Education Details talked to patient about continueing HEP.    Person(s) Educated Patient   Methods Explanation;Handout   Comprehension Verbalized understanding          PT Short Term Goals - 08/21/16 1356      PT SHORT TERM GOAL #1   Title Patients left figure eight edema will be < 1cm different then her right    Baseline still 5 cm difference in edema    Time 4   Period Weeks   Status Achieved     PT SHORT TERM GOAL #2   Title Patient will demsotrate a 5 sec single leg stance time   Baseline 3 sevcond with bilateral upper extremity support    Time 4   Period Weeks   Status On-going     PT SHORT TERM GOAL #3   Title Patient will ambulate 300' w/ no device and no walking boot    Baseline still using a cane    Time 4   Period Weeks   Status Partially Met     PT SHORT TERM GOAL #4   Title Patient will demostrate 4/5 gross left andkle strength    Baseline still 2/5 PF and 3+/5 DF    Time 4   Period Weeks   Status On-going     PT SHORT TERM GOAL #5   Title Patient will be I w./ HEP for strength and ROM    Time 4   Period Weeks   Status Achieved     PT SHORT TERM GOAL #6   Title Patient will increase left hip strength to 4+/5    Time 4   Period Weeks   Status On-going           PT Long Term Goals - 08/28/16 1019      PT LONG TERM GOAL #1   Title Patient will ambualte 3000' with no significant pain in order to go shopping    Time 8   Period Weeks   Status Unable to assess     PT LONG TERM GOAL #2   Title Patient will stand for 1 hour without increased pin in order to return to work    Time 8   Period Weeks  Status Partially Met     PT LONG TERM GOAL #3   Title Patient will go up/down 8 steps with reciprocol gait pattern.    Time 8   Period Weeks   Status Partially Met     PT LONG TERM GOAL #4   Title Pt will be 2/10 or less in left lower extremity  for all functional activities.   Baseline 2-3/10   Time 8   Period Weeks   Status On-going     PT LONG TERM GOAL #5   Title Pt will negotiate steps independently without use of assistive device   Baseline requires bilateral handrails    Time 8   Period Weeks   Status Partially Met               Plan - 08/31/16 1145    Clinical Impression Statement Patient had no significant improvement in ER/IR strength. She continues to have instability with gait and continues to require the use of an assistive device for safety. She has not shown much progress in strengthening or range of motion since the last certification. Therapy explained to the patient that this may be a long term process and she needs to continue working on her HEP. She will see the MD next week and likley D/C after that. Over the next few visits therapy will concntrate on making sure the patient is comfortable and confident with her HEP for balance, strength and motion.  No new goals met at this time.    Rehab Potential Good   PT Frequency 2x / week   PT Duration 8 weeks   PT Treatment/Interventions ADLs/Self Care Home Management;Cryotherapy;Electrical Stimulation;Moist Heat;Therapeutic exercise;Therapeutic activities;Functional mobility training;Stair training;Gait training;Ultrasound;Balance training;Patient/family education;Manual techniques;Passive range of motion;Scar mobilization;Vasopneumatic Device   PT Next Visit Plan MEASURE ACTIVE VERSES PROM ANKLE ALL PLANES> step ups and gait training on stairs/  peroneal tendon tape as needed. gait and balance in open and closed chain, recheck hip strength, , squats   PT Home Exercise Plan continue 4 way ankle, SLS with UE suppport   Consulted and Agree with Plan of Care Patient      Patient will benefit from skilled therapeutic intervention in order to improve the following deficits and impairments:  Abnormal gait, Decreased activity tolerance, Decreased balance, Decreased  mobility, Decreased knowledge of use of DME, Decreased endurance, Decreased coordination, Decreased skin integrity, Decreased strength, Impaired sensation, Impaired perceived functional ability, Pain  Visit Diagnosis: Pain in left ankle and joints of left foot - Plan: PT plan of care cert/re-cert  Difficulty in walking, not elsewhere classified - Plan: PT plan of care cert/re-cert  Stiffness of left ankle, not elsewhere classified - Plan: PT plan of care cert/re-cert  Muscle weakness (generalized) - Plan: PT plan of care cert/re-cert  Localized edema - Plan: PT plan of care cert/re-cert     Problem List Patient Active Problem List   Diagnosis Date Noted  . Cellulitis of toe of left foot 03/15/2016  . Toe ulcer ([place]) 03/15/2016  . Neoplasm of uncertain behavior of thyroid gland, right lobe 02/18/2015  . DIABETES MELLITUS, TYPE II 04/14/2009  . DIABETES MELLITUS, WITH RENAL COMPLICATIONS 82/95/6213  . HYPERLIPIDEMIA 04/14/2009  . OBESITY 04/14/2009  . ANXIETY DEPRESSION 04/14/2009  . HYPERTENSION 04/14/2009  . COPD 04/14/2009  . CHEST PAIN 04/14/2009  . TACHYCARDIA, HX OF 04/14/2009    Carney Living PT DPT  08/31/2016, 11:57 AM  [place],  [place], [address] Phone: [phone]   Fax:  [phone]  Name: AFRIKA BRICK MRN: [id number] Date of Birth: February 11, 1966

## 2016-09-02 NOTE — Progress Notes (Signed)
Patient ID: TEILA MOCZYGEMBA, female   DOB: Jun 13, 1966, 50 y.o.   MRN: [id number]  Subjective: 50 year old female presents today for follow-up evaluation of a wound to the left hallux. She states that she is doing well she has not noticed any open sores. She denies any redness or drainage or any swelling to her toe.  She is still under the active care of Dr. Doran Durand for her left ankle surgery and she is undergoing physical therapy. She states that she is very thankful that the range of motion has improved however she is still using a cane and is slowly improving.  Denies any systemic complaints such as fevers, chills, nausea, vomiting. No acute changes since last appointment, and no other complaints at this time.   Objective: AAO x3, NAD DP/PT pulses palpable bilaterally, CRT less than 3 seconds Incision from her surgery is healed with a scar. There is no tenderness to palpation.  There is a hyperkeratotic lesion present on the plantar aspect of the left hallux. Upon debridement there is no underlying ulceration, drainage or any signs of infection. There is no edema, erythema, increase in warmth. There is no questions or crepitus or malodor. There is no other open lesions or pre-ulcer lesions identified today. No pain with calf compression, swelling, warmth, erythema  Assessment: Left leg cellulitis; pre-ulcerative lesion to the hallux  Plan: -All treatment options discussed with the patient including all alternatives, risks, complications.  -Hyperkerotic lesion was debrided without complications or bleeding. Continue offloading. -Monitor for signs or symptoms of reoccurring infection. Call the office sooner should any occur. Follow-up as scheduled or sooner if needed. Continue follow Dr. Doran Durand for left ankle surgery.  Celesta Gentile, DPM

## 2016-09-03 ENCOUNTER — Ambulatory Visit: Payer: 59 | Admitting: Physical Therapy

## 2016-09-03 DIAGNOSIS — M25572 Pain in left ankle and joints of left foot: Secondary | ICD-10-CM | POA: Diagnosis not present

## 2016-09-03 DIAGNOSIS — M25672 Stiffness of left ankle, not elsewhere classified: Secondary | ICD-10-CM

## 2016-09-03 DIAGNOSIS — M6281 Muscle weakness (generalized): Secondary | ICD-10-CM

## 2016-09-03 DIAGNOSIS — R262 Difficulty in walking, not elsewhere classified: Secondary | ICD-10-CM

## 2016-09-03 DIAGNOSIS — R600 Localized edema: Secondary | ICD-10-CM

## 2016-09-03 NOTE — Therapy (Signed)
[place], [place], [address] Phone: [phone]   Fax:  [phone]  Physical Therapy Treatment  Patient Details  Name: [name] MRN: [id number] Date of Birth: May 20, 1966 Referring Provider: Dr Jeannine Boga  Encounter Date: 09/03/2016      PT End of Session - 09/03/16 1029    Visit Number 25   Number of Visits 28   Date for PT Re-Evaluation 09/21/16   Authorization Type [facility] U[facility]    PT Start Time 0930   PT Stop Time 1035   PT Time Calculation (min) 65 min   Activity Tolerance Patient tolerated treatment well   Behavior During Therapy [facility] for tasks assessed/performed      Past Medical History:  Diagnosis Date  . Anginal pain ([place])    chest pain  heart cath done  . Cancer ([place])   . Chronic bronchitis ([place])    "get it q yr"  . Fibromyalgia   . GERD (gastroesophageal reflux disease)   . Hepatic adenoma   . High cholesterol   . History of gout   . History of hiatal hernia   . Hypertension   . Hypothyroidism   . Iron deficiency anemia    "comes and goes"  . Kidney stones   . Migraine    "@ least once/month" (02/18/2015)  . PONV (postoperative nausea and vomiting)   . Proteinuria   . Tachycardia   . Type II diabetes mellitus ([place])     Past Surgical History:  Procedure Laterality Date  . ABDOMINAL HYSTERECTOMY  ~2012   "lap"  . ANKLE RECONSTRUCTION Left 03/29/2016   Procedure: LEFT ANKLE LATERAL LIGAMENT RECONSTRUCTION AND PERONEAL TENDON REPAIR OR TENOLYSIS;  Surgeon: Wylene Simmer, MD;  Location: [facility];  Service: Orthopedics;  Laterality: Left;  . BACK SURGERY    . CARDIAC CATHETERIZATION  04/2009   "clean"  . COLONOSCOPY    . CYSTOSCOPY WITH URETEROSCOPY, STONE BASKETRY AND STENT PLACEMENT  ~ 2006  . ESOPHAGOGASTRODUODENOSCOPY    . LAPAROSCOPIC CHOLECYSTECTOMY  05/2004  . LUMBAR DISC SURGERY  08/2011  . THYROIDECTOMY N/A 02/18/2015   Procedure: TOTAL THYROIDECTOMY;  Surgeon:  [name], MD;  Location: [facility];  Service: General;  Laterality: N/A;  . TOTAL THYROIDECTOMY  02/18/2015    There were no vitals filed for this visit.      Subjective Assessment - 09/03/16 0934    Subjective Patient went to a car show this weekend. She did good on the asfalt but on the grass she felt off balance. She had a loss of balance on the step over the weekend and scapred her arm.    Pertinent History Ankle surgery 03/29/2016, Fibromyalgia, Tachycardia,    Limitations Standing;Walking;House hold activities   How long can you stand comfortably? > 30 minin the brace anfd the shoe    How long can you walk comfortably? with a cane limited community distances    Diagnostic tests Nothing post-op   Patient Stated Goals Patient would like to be able to walk with less pain    Currently in Pain? Yes   Pain Score 3    Pain Location Ankle   Pain Orientation Left   Pain Descriptors / Indicators Aching   Pain Type Chronic pain   Pain Onset More than a month ago   Pain Frequency Constant   Aggravating Factors  walking and standing    Pain Relieving Factors rest and elevation    Effect of Pain  on Daily Activities unable to walk distances, poor balance                          [facility] Adult PT Treatment/Exercise - 09/03/16 0001      Knee/Hip Exercises: Supine   Bridges Limitations 2x10 with ankle dorsi flexion    Other Supine Knee/Hip Exercises supine clamshell 2x10 red      Manual Therapy   Manual therapy comments Talor Mobilizations grade 1 and II to improve dorsi flexio; Gentle ankle distraction to reduce inflammation and pain.      Ankle Exercises: Standing   Heel Raises 20 reps   Toe Raise 20 reps   Other Standing Ankle Exercises standing march 2x10; mini squats 2x10;      Ankle Exercises: Aerobic   Stationary Bike Nustep L5 LE only x 7 min                PT Education - 09/03/16 0938    Education provided Yes   Education Details reviewed HEp for  balance activity    Person(s) Educated Patient   Methods Explanation;Handout   Comprehension Verbalized understanding          PT Short Term Goals - 09/03/16 1048      PT SHORT TERM GOAL #1   Title Patients left figure eight edema will be < 1cm different then her right    Baseline still 5 cm difference in edema    Time 4   Period Weeks   Status Achieved     PT SHORT TERM GOAL #2   Title Patient will demsotrate a 5 sec single leg stance time   Baseline 3 sevcond with bilateral upper extremity support    Time 4   Period Weeks   Status On-going     PT SHORT TERM GOAL #3   Title Patient will ambulate 300' w/ no device and no walking boot    Baseline still using a cane    Time 4   Period Weeks   Status Partially Met     PT SHORT TERM GOAL #4   Title Patient will demostrate 4/5 gross left andkle strength    Baseline still 2/5 PF and 3+/5 DF    Time 4   Period Weeks   Status On-going     PT SHORT TERM GOAL #5   Title Patient will be I w./ HEP for strength and ROM    Time 4   Period Weeks   Status Achieved     PT SHORT TERM GOAL #6   Title Patient will increase left hip strength to 4+/5    Time 4   Period Weeks   Status New           PT Long Term Goals - 08/28/16 1019      PT LONG TERM GOAL #1   Title Patient will ambualte 3000' with no significant pain in order to go shopping    Time 8   Period Weeks   Status Unable to assess     PT LONG TERM GOAL #2   Title Patient will stand for 1 hour without increased pin in order to return to work    Time 8   Period Weeks   Status Partially Met     PT LONG TERM GOAL #3   Title Patient will go up/down 8 steps with reciprocol gait pattern.    Time 8   Period Weeks   Status  Partially Met     PT LONG TERM GOAL #4   Title Pt will be 2/10 or less in left lower extremity for all functional activities.   Baseline 2-3/10   Time 8   Period Weeks   Status On-going     PT LONG TERM GOAL #5   Title Pt will  negotiate steps independently without use of assistive device   Baseline requires bilateral handrails    Time 8   Period Weeks   Status Partially Met               Plan - 09/03/16 1030    Clinical Impression Statement Patient tolerated treatment well. She was able to perfrom mini squats and marching with no significant difficulty. she had some difficulty with toe raises in standing. She required mod a for balance correction on the airex mat. She was given updated HEP for standing activity. No new goals met.    Rehab Potential Good   PT Frequency 2x / week   PT Duration 8 weeks   PT Treatment/Interventions ADLs/Self Care Home Management;Cryotherapy;Electrical Stimulation;Moist Heat;Therapeutic exercise;Therapeutic activities;Functional mobility training;Stair training;Gait training;Ultrasound;Balance training;Patient/family education;Manual techniques;Passive range of motion;Scar mobilization;Vasopneumatic Device   PT Next Visit Plan MEASURE ACTIVE VERSES PROM ANKLE ALL PLANES> step ups and gait training on stairs/  peroneal tendon tape as needed. gait and balance in open and closed chain, recheck hip strength, , squats   PT Home Exercise Plan continue 4 way ankle, SLS with UE suppport   Consulted and Agree with Plan of Care Patient      Patient will benefit from skilled therapeutic intervention in order to improve the following deficits and impairments:  Abnormal gait, Decreased activity tolerance, Decreased balance, Decreased mobility, Decreased knowledge of use of DME, Decreased endurance, Decreased coordination, Decreased skin integrity, Decreased strength, Impaired sensation, Impaired perceived functional ability, Pain  Visit Diagnosis: Pain in left ankle and joints of left foot  Difficulty in walking, not elsewhere classified  Stiffness of left ankle, not elsewhere classified  Muscle weakness (generalized)  Localized edema     Problem List Patient Active Problem List    Diagnosis Date Noted  . Cellulitis of toe of left foot 03/15/2016  . Toe ulcer ([place]) 03/15/2016  . Neoplasm of uncertain behavior of thyroid gland, right lobe 02/18/2015  . DIABETES MELLITUS, TYPE II 04/14/2009  . DIABETES MELLITUS, WITH RENAL COMPLICATIONS 00/76/2263  . HYPERLIPIDEMIA 04/14/2009  . OBESITY 04/14/2009  . ANXIETY DEPRESSION 04/14/2009  . HYPERTENSION 04/14/2009  . COPD 04/14/2009  . CHEST PAIN 04/14/2009  . TACHYCARDIA, HX OF 04/14/2009    Carney Living 09/03/2016, 11:02 AM  [facility] [address] [place], [place], [address] Phone: [phone]   Fax:  [phone]  Name: [name] MRN: [id number] Date of Birth: Apr 22, 1966

## 2016-09-04 ENCOUNTER — Other Ambulatory Visit: Payer: Self-pay | Admitting: Pharmacist

## 2016-09-04 DIAGNOSIS — Z794 Long term (current) use of insulin: Secondary | ICD-10-CM

## 2016-09-04 DIAGNOSIS — E118 Type 2 diabetes mellitus with unspecified complications: Secondary | ICD-10-CM

## 2016-09-04 NOTE — Patient Outreach (Signed)
[facility]) Care Management  09/04/2016  [place] REUBEN WINER [birth year] [id number]   Subjective:     Patient is a 50 yo female with type 2 diabetes who presents today for 3 month follow-up as part of the employer-sponsored Link to Wellness program. Current diabetes regimen includes Humulin U-500 via pump (approximately 60 units per day) and Invokamet 50-1000 mg twice daily. Patient also continues on daily ASA, ACEi, and statin. Patient will follow-up with Dr. Forde Dandy again in October (most recently seen in July) for labwork and diabetes review.  Patient continues to struggle with ankle/foot pain after surgery and has been out of work due to physician orders for limited mobility. She continues to have this monitored closely. She continues in physical therapy.   Of note, pt is now taking less APAP due to LFTs trending upward.   Physicians: Doran Durand (ortho), [place] (PCP and endo), [place] (podiatrist), [place] (renal)   Diabetes Assessment:   No changes to diabetes regimen given recent A1c at goal. Patient does maintain good medication compliance and boluses all meals and snacks. Most recent A1c was 6.7% (prev 6.8). Weight has remained unchanged since last visit. Patient did not bring meter today but is currently testing 2-3 times per day on average, more if symptomatic, and also wears a CGM as needed for periods of time when glucose uncontrolled. Hypoglycemia continues but is only on occasion, becomes symptomatic with twitching/nervous feeling at glucose of 75 or below.  She tests and corrects with yogurt or juice and also has glucose gels by her bedside in case of emergency.  She continues to struggle with neuropathy in feet along with ankle/foot (post surgery). She is being followed by ortho and podiatry and has been remains out of work with orders for limited mobility.     Lifestyle Assessment:  Diet - Patient continues to struggle with esophageal  issues making diet less than optimal. She continues to eat mainly soft foods and ground meat such as ground beef, or fish. She cannot tolerate rice and certain breads such as rolls. Patient continues to be very frustrated with dietary/esophageal issues and has had two esophageal dilations this year.  She is making efforts to make healthy dietary choices but struggles due to limitations.  She is now eating yogurt for breakfast.  She continues carb counting regularly.  Passed swallow study and results indicated her swallowing issues are most likely related to esophageal issues vs swallowing issues.    Exercise - Pt is on walking restrictions due to multiple issues with left foot/ankle. She has now been released for walking around the house and is doing physical therapy twice weekly.  She has been switched from boot to brace but balance remains an issue.  Despite continued limitations, patient has experienced improvement and can now walk the entire grocery store without having to go to the car to relieve pain.  She does have to take a break halfway through but can do so while standing and then continue shopping.     Plan and Goals: 1) Continue testing regularly 2) Continue to stay as active as able, attend physical therapy as instructed, good job with improvement in this area 3) Continue to work toward a healthy diet as foods are tolerated 4) Follow-up in 6 months (due to at goal A1c) on Tuesday  March 27th @ 10:00 am  email goals/appt - [email or web address]   Great to see you today!

## 2016-09-05 DIAGNOSIS — M25572 Pain in left ankle and joints of left foot: Secondary | ICD-10-CM | POA: Diagnosis not present

## 2016-09-06 ENCOUNTER — Ambulatory Visit: Payer: 59 | Admitting: Physical Therapy

## 2016-09-06 DIAGNOSIS — R262 Difficulty in walking, not elsewhere classified: Secondary | ICD-10-CM | POA: Diagnosis not present

## 2016-09-06 DIAGNOSIS — M6281 Muscle weakness (generalized): Secondary | ICD-10-CM

## 2016-09-06 DIAGNOSIS — R600 Localized edema: Secondary | ICD-10-CM | POA: Diagnosis not present

## 2016-09-06 DIAGNOSIS — M25572 Pain in left ankle and joints of left foot: Secondary | ICD-10-CM | POA: Diagnosis not present

## 2016-09-06 DIAGNOSIS — M25672 Stiffness of left ankle, not elsewhere classified: Secondary | ICD-10-CM

## 2016-09-06 NOTE — Therapy (Signed)
[place], [place], [address] Phone: [phone]   Fax:  [phone]  Physical Therapy Treatment  Patient Details  Name: [name] MRN: [id number] Date of Birth: Mar 25, 1966 Referring Provider: Dr Jeannine Boga  Encounter Date: 09/06/2016      PT End of Session - 09/06/16 0852    Visit Number 26   Date for PT Re-Evaluation 09/21/16   Authorization Type [facility] UMR    PT Start Time 0846   PT Stop Time 0932   PT Time Calculation (min) 46 min      Past Medical History:  Diagnosis Date  . Anginal pain ([place])    chest pain  heart cath done  . Cancer ([place])   . Chronic bronchitis ([place])    "get it q yr"  . Fibromyalgia   . GERD (gastroesophageal reflux disease)   . Hepatic adenoma   . High cholesterol   . History of gout   . History of hiatal hernia   . Hypertension   . Hypothyroidism   . Iron deficiency anemia    "comes and goes"  . Kidney stones   . Migraine    "@ least once/month" (02/18/2015)  . PONV (postoperative nausea and vomiting)   . Proteinuria   . Tachycardia   . Type II diabetes mellitus ([place])     Past Surgical History:  Procedure Laterality Date  . ABDOMINAL HYSTERECTOMY  ~2012   "lap"  . ANKLE RECONSTRUCTION Left 03/29/2016   Procedure: LEFT ANKLE LATERAL LIGAMENT RECONSTRUCTION AND PERONEAL TENDON REPAIR OR TENOLYSIS;  Surgeon: Wylene Simmer, MD;  Location: [facility];  Service: Orthopedics;  Laterality: Left;  . BACK SURGERY    . CARDIAC CATHETERIZATION  04/2009   "clean"  . COLONOSCOPY    . CYSTOSCOPY WITH URETEROSCOPY, STONE BASKETRY AND STENT PLACEMENT  ~ 2006  . ESOPHAGOGASTRODUODENOSCOPY    . LAPAROSCOPIC CHOLECYSTECTOMY  05/2004  . LUMBAR DISC SURGERY  08/2011  . THYROIDECTOMY N/A 02/18/2015   Procedure: TOTAL THYROIDECTOMY;  Surgeon: [name], MD;  Location: [facility];  Service: General;  Laterality: N/A;  . TOTAL THYROIDECTOMY  02/18/2015    There were no vitals filed  for this visit.      Subjective Assessment - 09/06/16 3734    Subjective I saw the MD yesterday. I asked him a lot of questions. He thinks I require increased healing time. He wants to see me again in 6 weeks. He filled out a disability form for me.    Currently in Pain? Yes   Pain Score 3    Pain Location Ankle   Pain Orientation Left   Pain Descriptors / Indicators Aching                         [facility] Adult PT Treatment/Exercise - 09/06/16 0001      Knee/Hip Exercises: Supine   Bridges Limitations 2x10 with ankle dorsi flexion      Ankle Exercises: Sidelying   Ankle Inversion 20 reps   Ankle Eversion 20 reps     Ankle Exercises: Standing   Rocker Board 3 minutes   Heel Raises 20 reps   Toe Raise 20 reps   Other Standing Ankle Exercises tandem gait 163f CGA     Ankle Exercises: Aerobic   Stationary Bike Nustep L5 LE only x 7 min   Tread Mill 1.2 MPH grade 1 x 7 minutes to normalize gait and focus heel strike  PT Short Term Goals - 09/06/16 1010      PT SHORT TERM GOAL #1   Title Patients left figure eight edema will be < 1cm different then her right    Status Achieved     PT SHORT TERM GOAL #2   Title Patient will demsotrate a 5 sec single leg stance time   Status On-going     PT SHORT TERM GOAL #3   Title Patient will ambulate 300' w/ no device and no walking boot    Baseline still using a cane    Time 4   Period Weeks   Status Partially Met     PT SHORT TERM GOAL #4   Title Patient will demostrate 4/5 gross left andkle strength    Baseline still 2/5 PF and 3+/5 DF    Time 4   Period Weeks   Status On-going     PT SHORT TERM GOAL #5   Title Patient will be I w./ HEP for strength and ROM    Status Achieved     PT SHORT TERM GOAL #6   Title Patient will increase left hip strength to 4+/5    Time 4   Period Weeks   Status Unable to assess           PT Long Term Goals - 09/06/16 1011      PT LONG TERM  GOAL #1   Title Patient will ambualte 3000' with no significant pain in order to go shopping    Baseline ambulated in [employer] for 1.5 hours however significant increase in pain   Time 8   Period Weeks   Status On-going     PT LONG TERM GOAL #2   Title Patient will stand for 1 hour without increased pin in order to return to work    Baseline 1 hour with pain, will not return to same job   Time 8   Period Weeks   Status Partially Met     PT LONG TERM GOAL #3   Title Patient will go up/down 8 steps with reciprocol gait pattern.    Baseline able to to go up and down 8 stairs with reciprocal pattern and bilateral hand rails   Time 8   Period Weeks   Status Partially Met     PT LONG TERM GOAL #4   Title Pt will be 2/10 or less in left lower extremity for all functional activities.   Baseline 2-3/10   Time 8   Period Weeks   Status Partially Met     PT LONG TERM GOAL #5   Title Pt will negotiate steps independently without use of assistive device   Baseline requires bilateral handrails    Time 8   Period Weeks   Status Partially Met               Plan - 09/06/16 0943    Clinical Impression Statement Used treadmill to normailze gait pattern and promote heel strike. Pt deomstrates fatigue at 1.2MPH zero grade. Educated pt in benefit of cardiovascular exercise using treadmill. Tandem gait with CGA 139f. Continued sidelying ankle inversion and eversion strength pt pt demonstrating decreased AROM compared to her PROM.    PT Next Visit Plan CHECK STAIR GOALS;  step ups and gait training on stairs/  peroneal tendon tape as needed. gait and balance in open and closed chain, recheck hip strength, , squats; Finalize HEP      Patient will benefit from skilled therapeutic  intervention in order to improve the following deficits and impairments:  Abnormal gait, Decreased activity tolerance, Decreased balance, Decreased mobility, Decreased knowledge of use of DME, Decreased endurance,  Decreased coordination, Decreased skin integrity, Decreased strength, Impaired sensation, Impaired perceived functional ability, Pain  Visit Diagnosis: Pain in left ankle and joints of left foot  Difficulty in walking, not elsewhere classified  Stiffness of left ankle, not elsewhere classified  Muscle weakness (generalized)  Localized edema     Problem List Patient Active Problem List   Diagnosis Date Noted  . Cellulitis of toe of left foot 03/15/2016  . Toe ulcer ([place]) 03/15/2016  . Neoplasm of uncertain behavior of thyroid gland, right lobe 02/18/2015  . DIABETES MELLITUS, TYPE II 04/14/2009  . DIABETES MELLITUS, WITH RENAL COMPLICATIONS 24/17/5301  . HYPERLIPIDEMIA 04/14/2009  . OBESITY 04/14/2009  . ANXIETY DEPRESSION 04/14/2009  . HYPERTENSION 04/14/2009  . COPD 04/14/2009  . CHEST PAIN 04/14/2009  . TACHYCARDIA, HX OF 04/14/2009    Dorene Ar, PTA 09/06/2016, 10:13 AM  [facility] [address] [place], [place], [address] Phone: [phone]   Fax:  [phone]  Name: DEONA NOVITSKI MRN: [id number] Date of Birth: 1965-12-24

## 2016-09-11 ENCOUNTER — Ambulatory Visit: Payer: 59 | Attending: Endocrinology | Admitting: Physical Therapy

## 2016-09-11 DIAGNOSIS — M25672 Stiffness of left ankle, not elsewhere classified: Secondary | ICD-10-CM | POA: Diagnosis not present

## 2016-09-11 DIAGNOSIS — R600 Localized edema: Secondary | ICD-10-CM | POA: Insufficient documentation

## 2016-09-11 DIAGNOSIS — L719 Rosacea, unspecified: Secondary | ICD-10-CM | POA: Diagnosis not present

## 2016-09-11 DIAGNOSIS — R262 Difficulty in walking, not elsewhere classified: Secondary | ICD-10-CM | POA: Insufficient documentation

## 2016-09-11 DIAGNOSIS — M6281 Muscle weakness (generalized): Secondary | ICD-10-CM | POA: Insufficient documentation

## 2016-09-11 DIAGNOSIS — M25572 Pain in left ankle and joints of left foot: Secondary | ICD-10-CM | POA: Insufficient documentation

## 2016-09-11 DIAGNOSIS — E890 Postprocedural hypothyroidism: Secondary | ICD-10-CM | POA: Diagnosis not present

## 2016-09-11 NOTE — Therapy (Signed)
[place], [place], [address] Phone: [phone]   Fax:  [phone]  Physical Therapy Treatment  Patient Details  Name: [name] MRN: [id number] Date of Birth: 12-Jul-1966 Referring Provider: Dr Jeannine Boga  Encounter Date: 09/11/2016      PT End of Session - 09/11/16 0851    Visit Number 27   Number of Visits 28   Date for PT Re-Evaluation 09/21/16   Authorization Type [facility] U[facility]    PT Start Time 0846   PT Stop Time 0930   PT Time Calculation (min) 44 min      Past Medical History:  Diagnosis Date  . Anginal pain ([place])    chest pain  heart cath done  . Cancer ([place])   . Chronic bronchitis ([place])    "get it q yr"  . Fibromyalgia   . GERD (gastroesophageal reflux disease)   . Hepatic adenoma   . High cholesterol   . History of gout   . History of hiatal hernia   . Hypertension   . Hypothyroidism   . Iron deficiency anemia    "comes and goes"  . Kidney stones   . Migraine    "@ least once/month" (02/18/2015)  . PONV (postoperative nausea and vomiting)   . Proteinuria   . Tachycardia   . Type II diabetes mellitus ([place])     Past Surgical History:  Procedure Laterality Date  . ABDOMINAL HYSTERECTOMY  ~2012   "lap"  . ANKLE RECONSTRUCTION Left 03/29/2016   Procedure: LEFT ANKLE LATERAL LIGAMENT RECONSTRUCTION AND PERONEAL TENDON REPAIR OR TENOLYSIS;  Surgeon: Wylene Simmer, MD;  Location: [facility];  Service: Orthopedics;  Laterality: Left;  . BACK SURGERY    . CARDIAC CATHETERIZATION  04/2009   "clean"  . COLONOSCOPY    . CYSTOSCOPY WITH URETEROSCOPY, STONE BASKETRY AND STENT PLACEMENT  ~ 2006  . ESOPHAGOGASTRODUODENOSCOPY    . LAPAROSCOPIC CHOLECYSTECTOMY  05/2004  . LUMBAR DISC SURGERY  08/2011  . THYROIDECTOMY N/A 02/18/2015   Procedure: TOTAL THYROIDECTOMY;  Surgeon: [name], MD;  Location: [facility];  Service: General;  Laterality: N/A;  . TOTAL THYROIDECTOMY  02/18/2015     There were no vitals filed for this visit.      Subjective Assessment - 09/11/16 0850    Subjective I am tired. I went to the grocerry store and walked outside.    Currently in Pain? Yes   Pain Score 4    Pain Location Ankle   Pain Orientation Left   Pain Descriptors / Indicators Aching   Aggravating Factors  walking and standing.    Pain Relieving Factors rest and elevation.             [facility] PT Assessment - 09/11/16 0001      Strength   Left Hip Flexion 4-/5   Left Hip Extension 4-/5   Left Hip ABduction 4/5                     [facility] Adult PT Treatment/Exercise - 09/11/16 0001      High Level Balance   High Level Balance Comments SLS 3 sec best , tandem 15 sec best     Knee/Hip Exercises: Standing   Stairs Able to ambulate up and down 6 inch stairs with 1 HR safely reciprocally. Retro step ups with bialteral handrails and 4 inch steps x 10 , multiple trials on stairs in clinic     Knee/Hip  Exercises: Supine   Straight Leg Raises 10 reps;2 sets     Ankle Exercises: Aerobic   Tread Mill 1.2 MPH grade 1 x 7 minutes to normalize gait and focus heel strike     Ankle Exercises: Standing   Other Standing Ankle Exercises tandem gait 171f CGA     Ankle Exercises: Sidelying   Ankle Inversion 20 reps  attempted weight, removed   Ankle Eversion 20 reps;AROM  attempted weight , removed                  PT Short Term Goals - 09/11/16 1044      PT SHORT TERM GOAL #1   Title Patients left figure eight edema will be < 1cm different then her right    Time 4   Period Weeks   Status Achieved     PT SHORT TERM GOAL #2   Title Patient will demsotrate a 5 sec single leg stance time   Baseline # sec best without UE support   Time 4   Period Weeks   Status On-going     PT SHORT TERM GOAL #3   Title Patient will ambulate 300' w/ no device and no walking boot    Baseline still using a cane    Time 4   Period Weeks   Status Partially Met      PT SHORT TERM GOAL #4   Title Patient will demostrate 4/5 gross left andkle strength    Baseline still 2/5 PF and 3+/5 DF    Time 4   Period Weeks   Status On-going     PT SHORT TERM GOAL #5   Title Patient will be I w./ HEP for strength and ROM    Time 4   Period Weeks   Status Achieved     PT SHORT TERM GOAL #6   Title Patient will increase left hip strength to 4+/5    Period Weeks   Status On-going           PT Long Term Goals - 09/11/16 02458     PT LONG TERM GOAL #1   Title Patient will ambualte 3000' with no significant pain in order to go shopping    Baseline ambulated in store for 1 hour pain increases from 3/10 to 6/10.    Time 8   Period Weeks   Status Partially Met     PT LONG TERM GOAL #2   Title Patient will stand for 1 hour without increased pin in order to return to work    Baseline 1 hour with pain, will not return to same job   Time 8   Period Weeks   Status Partially Met     PT LONG TERM GOAL #3   Title Patient will go up/down 8 steps with reciprocol gait pattern.    Baseline able to to go up and down 8 stairs with reciprocal pattern with 1 HR   Time 8   Period Weeks   Status Achieved     PT LONG TERM GOAL #4   Title Pt will be 2/10 or less in left lower extremity for all functional activities.   Baseline 2-3/10 unless walking/standing    Time 8   Period Weeks   Status Partially Met     PT LONG TERM GOAL #5   Title Pt will negotiate steps independently without use of assistive device   Baseline requires 1 HR   Time 8   Period  Weeks   Status Achieved               Plan - 09/11/16 1048    Clinical Impression Statement Pt hip strength has improved however still 4-/5 at best on left. SLS is unchanged and remains 3 secs on left with 11 sec best on right. Tandem 15 sec best. Pt is able to navigate reciprocal stairs with one Hand Rail. Her pain increases from 4/10 to 6/10 with standing therex and balance today. Her pai increases to  6/10 with community ambulation greater than one hour. LTG# 1 partially Met, #3 Met, #5 Met.    PT Next Visit Plan review HEP and discharge.       Patient will benefit from skilled therapeutic intervention in order to improve the following deficits and impairments:  Abnormal gait, Decreased activity tolerance, Decreased balance, Decreased mobility, Decreased knowledge of use of DME, Decreased endurance, Decreased coordination, Decreased skin integrity, Decreased strength, Impaired sensation, Impaired perceived functional ability, Pain  Visit Diagnosis: Pain in left ankle and joints of left foot  Difficulty in walking, not elsewhere classified  Stiffness of left ankle, not elsewhere classified  Muscle weakness (generalized)  Localized edema     Problem List Patient Active Problem List   Diagnosis Date Noted  . Cellulitis of toe of left foot 03/15/2016  . Toe ulcer ([place]) 03/15/2016  . Neoplasm of uncertain behavior of thyroid gland, right lobe 02/18/2015  . DIABETES MELLITUS, TYPE II 04/14/2009  . DIABETES MELLITUS, WITH RENAL COMPLICATIONS 85/92/9244  . HYPERLIPIDEMIA 04/14/2009  . OBESITY 04/14/2009  . ANXIETY DEPRESSION 04/14/2009  . HYPERTENSION 04/14/2009  . COPD 04/14/2009  . CHEST PAIN 04/14/2009  . TACHYCARDIA, HX OF 04/14/2009    Dorene Ar, PTA 09/11/2016, 2:20 PM  [facility] [address] [place], [place], [address] Phone: [phone]   Fax:  [phone]  Name: [name] MRN: [id number] Date of Birth: [birth year]

## 2016-09-12 DIAGNOSIS — C73 Malignant neoplasm of thyroid gland: Secondary | ICD-10-CM | POA: Diagnosis not present

## 2016-09-12 DIAGNOSIS — M797 Fibromyalgia: Secondary | ICD-10-CM | POA: Diagnosis not present

## 2016-09-12 DIAGNOSIS — E1129 Type 2 diabetes mellitus with other diabetic kidney complication: Secondary | ICD-10-CM | POA: Diagnosis not present

## 2016-09-12 DIAGNOSIS — N08 Glomerular disorders in diseases classified elsewhere: Secondary | ICD-10-CM | POA: Diagnosis not present

## 2016-09-12 DIAGNOSIS — E11621 Type 2 diabetes mellitus with foot ulcer: Secondary | ICD-10-CM | POA: Diagnosis not present

## 2016-09-12 DIAGNOSIS — E890 Postprocedural hypothyroidism: Secondary | ICD-10-CM | POA: Diagnosis not present

## 2016-09-12 DIAGNOSIS — R2689 Other abnormalities of gait and mobility: Secondary | ICD-10-CM | POA: Diagnosis not present

## 2016-09-12 DIAGNOSIS — E784 Other hyperlipidemia: Secondary | ICD-10-CM | POA: Diagnosis not present

## 2016-09-12 DIAGNOSIS — E1142 Type 2 diabetes mellitus with diabetic polyneuropathy: Secondary | ICD-10-CM | POA: Diagnosis not present

## 2016-09-13 ENCOUNTER — Ambulatory Visit: Payer: 59 | Admitting: Physical Therapy

## 2016-09-13 DIAGNOSIS — R262 Difficulty in walking, not elsewhere classified: Secondary | ICD-10-CM | POA: Diagnosis not present

## 2016-09-13 DIAGNOSIS — M6281 Muscle weakness (generalized): Secondary | ICD-10-CM | POA: Diagnosis not present

## 2016-09-13 DIAGNOSIS — E890 Postprocedural hypothyroidism: Secondary | ICD-10-CM | POA: Diagnosis not present

## 2016-09-13 DIAGNOSIS — M25672 Stiffness of left ankle, not elsewhere classified: Secondary | ICD-10-CM

## 2016-09-13 DIAGNOSIS — R600 Localized edema: Secondary | ICD-10-CM

## 2016-09-13 DIAGNOSIS — M25572 Pain in left ankle and joints of left foot: Secondary | ICD-10-CM

## 2016-09-13 NOTE — Therapy (Addendum)
[place], [place], [address] Phone: [phone]   Fax:  [phone]  Physical Therapy Treatment/ Discharge   Patient Details  Name: [name] MRN: [id number] Date of Birth: [birth year] Referring Provider: Dr Jeannine Boga  Encounter Date: 09/13/2016      PT End of Session - 09/13/16 0949    Visit Number 28   Number of Visits 28   Date for PT Re-Evaluation 09/21/16   Authorization Type [facility] U[facility]    PT Start Time 0845   PT Stop Time 0944   PT Time Calculation (min) 59 min   Activity Tolerance Patient tolerated treatment well   Behavior During Therapy [facility] for tasks assessed/performed      Past Medical History:  Diagnosis Date  . Anginal pain ([place])    chest pain  heart cath done  . Cancer ([place])   . Chronic bronchitis ([place])    "get it q yr"  . Fibromyalgia   . GERD (gastroesophageal reflux disease)   . Hepatic adenoma   . High cholesterol   . History of gout   . History of hiatal hernia   . Hypertension   . Hypothyroidism   . Iron deficiency anemia    "comes and goes"  . Kidney stones   . Migraine    "@ least once/month" (02/18/2015)  . PONV (postoperative nausea and vomiting)   . Proteinuria   . Tachycardia   . Type II diabetes mellitus ([place])     Past Surgical History:  Procedure Laterality Date  . ABDOMINAL HYSTERECTOMY  ~2012   "lap"  . ANKLE RECONSTRUCTION Left 03/29/2016   Procedure: LEFT ANKLE LATERAL LIGAMENT RECONSTRUCTION AND PERONEAL TENDON REPAIR OR TENOLYSIS;  Surgeon: Wylene Simmer, MD;  Location: [facility];  Service: Orthopedics;  Laterality: Left;  . BACK SURGERY    . CARDIAC CATHETERIZATION  04/2009   "clean"  . COLONOSCOPY    . CYSTOSCOPY WITH URETEROSCOPY, STONE BASKETRY AND STENT PLACEMENT  ~ 2006  . ESOPHAGOGASTRODUODENOSCOPY    . LAPAROSCOPIC CHOLECYSTECTOMY  05/2004  . LUMBAR DISC SURGERY  08/2011  . THYROIDECTOMY N/A 02/18/2015   Procedure: TOTAL THYROIDECTOMY;   Surgeon: [name], MD;  Location: [facility];  Service: General;  Laterality: N/A;  . TOTAL THYROIDECTOMY  02/18/2015    There were no vitals filed for this visit.      Subjective Assessment - 09/13/16 0941    Subjective Patient reports it has huret on and off. Her husband is here to learn how to do the stretching.    Pertinent History Ankle surgery 03/29/2016, Fibromyalgia, Tachycardia,    Limitations Standing;Walking;House hold activities   How long can you stand comfortably? > 30 minin the brace anfd the shoe    How long can you walk comfortably? with a cane limited community distances    Diagnostic tests Nothing post-op   Patient Stated Goals Patient would like to be able to walk with less pain    Currently in Pain? Yes   Pain Score 5    Pain Location Ankle   Pain Orientation Left   Pain Descriptors / Indicators Aching   Pain Type Chronic pain   Pain Onset More than a month ago   Pain Frequency Constant   Aggravating Factors  walking and standing    Pain Relieving Factors rest and elevation    Effect of Pain on Daily Activities unable to walk distancesw  [facility] PT Assessment - 09/13/16 0001      PROM   Left Ankle Dorsiflexion 10   Left Ankle Plantar Flexion 37   Left Ankle Inversion 15   Left Ankle Eversion 19     Strength   Left Hip Flexion 4-/5   Left Hip Extension 4-/5   Left Hip ABduction 4/5   Left Ankle Dorsiflexion 4/5   Left Ankle Plantar Flexion 2+/5   Left Ankle Inversion 2+/5   Left Ankle Eversion 3/5                     [facility] Adult PT Treatment/Exercise - 09/13/16 0001      Knee/Hip Exercises: Supine   Bridges Limitations 2x10 with ankle dorsi flexion    Straight Leg Raises 10 reps;2 sets     Manual Therapy   Manual therapy comments Talor Mobilizations grade 1 and II to improve dorsi flexio; Gentle ankle distraction to reduce inflammation and pain.      Ankle Exercises: Aerobic   Stationary Bike Nustep L5 LE only x 7 min      Ankle Exercises: Standing   Rocker Board 3 minutes   Heel Raises 20 reps   Toe Raise 20 reps     Ankle Exercises: Sidelying   Ankle Inversion 20 reps  attempted weight, removed   Ankle Eversion 20 reps;AROM  attempted weight , removed                PT Education - 09/13/16 0940    Education provided Yes   Education Details reviewed HEP and all exercises.    Person(s) Educated Patient   Methods Explanation;Handout   Comprehension Verbalized understanding          PT Short Term Goals - 09/13/16 1442      PT SHORT TERM GOAL #1   Title Patients left figure eight edema will be < 1cm different then her right    Baseline 3cm difference   Time 4   Period Weeks   Status On-going     PT SHORT TERM GOAL #2   Title Patient will demsotrate a 5 sec single leg stance time   Baseline #3 sec best without UE support   Status On-going     PT SHORT TERM GOAL #3   Title Patient will ambulate 300' w/ no device and no walking boot    Baseline still using a cane    Time 4   Period Weeks   Status Partially Met     PT SHORT TERM GOAL #4   Title Patient will demostrate 4/5 gross left andkle strength      PT SHORT TERM GOAL #5   Title Patient will be I w./ HEP for strength and ROM    Time 4   Period Weeks   Status Achieved     PT SHORT TERM GOAL #6   Title Patient will increase left hip strength to 4+/5    Time 4   Period Weeks   Status On-going           PT Long Term Goals - 09/13/16 1501      PT LONG TERM GOAL #1   Title Patient will ambualte 3000' with no significant pain in order to go shopping    Baseline ambulated in store for 1 hour pain increases from 3/10 to 6/10.    Time 8   Period Weeks   Status Partially Met     PT LONG TERM GOAL #  2   Title Patient will stand for 1 hour without increased pin in order to return to work    Baseline 1 hour with pain, will not return to same job   Time 8   Period Weeks   Status Partially Met     PT LONG TERM  GOAL #3   Title Patient will go up/down 8 steps with reciprocol gait pattern.    Baseline able to to go up and down 8 stairs with reciprocal pattern with 1 HR   Time 8   Period Weeks   Status Achieved     PT LONG TERM GOAL #4   Title Pt will be 2/10 or less in left lower extremity for all functional activities.   Baseline 2-3/10 unless walking/standing    Time 8   Period Weeks   Status Partially Met     PT LONG TERM GOAL #5   Title Pt will negotiate steps independently without use of assistive device   Baseline requires 1 HR   Time 8   Period Weeks   Status Achieved               Plan - 09/13/16 1438    Clinical Impression Statement Extensive education provided on the importance of continued stretching and strengthening. She has reached her max potential for therapy. She continues to have significant weakness with eversion and inversion but she has a workout plan to promete IV/EV stregthening. She is motivaed to improve her strength and stability. Therapy educated her husabnd on stretching at home with talor stabilzation and light distraction. She was also given balance exercises.    Rehab Potential Good   PT Frequency 2x / week   PT Duration 8 weeks   PT Treatment/Interventions ADLs/Self Care Home Management;Cryotherapy;Electrical Stimulation;Moist Heat;Therapeutic exercise;Therapeutic activities;Functional mobility training;Stair training;Gait training;Ultrasound;Balance training;Patient/family education;Manual techniques;Passive range of motion;Scar mobilization;Vasopneumatic Device   PT Next Visit Plan review HEP and discharge.    PT Home Exercise Plan continue 4 way ankle, SLS with UE suppport   Consulted and Agree with Plan of Care Patient      Patient will benefit from skilled therapeutic intervention in order to improve the following deficits and impairments:  Abnormal gait, Decreased activity tolerance, Decreased balance, Decreased mobility, Decreased knowledge of  use of DME, Decreased endurance, Decreased coordination, Decreased skin integrity, Decreased strength, Impaired sensation, Impaired perceived functional ability, Pain  Visit Diagnosis: Pain in left ankle and joints of left foot  Difficulty in walking, not elsewhere classified  Stiffness of left ankle, not elsewhere classified  Muscle weakness (generalized)  Localized edema  Difficulty walking    PHYSICAL THERAPY DISCHARGE SUMMARY  Visits from Start of Care: 28  Current functional level related to goals / functional outcomes:  ambulating with cane, lacking stability  Remaining deficits: Limited balance, EV/IV strength, and still using a cane    Education / Equipment: HEP  Plan: Patient agrees to discharge.  Patient goals were partially met. Patient is being discharged due to lack of progress.  ?????      Problem List Patient Active Problem List   Diagnosis Date Noted  . Cellulitis of toe of left foot 03/15/2016  . Toe ulcer ([place]) 03/15/2016  . Neoplasm of uncertain behavior of thyroid gland, right lobe 02/18/2015  . DIABETES MELLITUS, TYPE II 04/14/2009  . DIABETES MELLITUS, WITH RENAL COMPLICATIONS 50/02/7047  . HYPERLIPIDEMIA 04/14/2009  . OBESITY 04/14/2009  . ANXIETY DEPRESSION 04/14/2009  . HYPERTENSION 04/14/2009  . COPD 04/14/2009  .  CHEST PAIN 04/14/2009  . TACHYCARDIA, HX OF 04/14/2009    Carney Living PT DPT  09/13/2016, 3:14 PM  [facility] [address] [place], [place], [address] Phone: [phone]   Fax:  [phone]  Name: [name] MRN: [id number] Date of Birth: Jan 12, 1966

## 2016-09-17 DIAGNOSIS — F329 Major depressive disorder, single episode, unspecified: Secondary | ICD-10-CM | POA: Diagnosis not present

## 2016-09-17 DIAGNOSIS — E108 Type 1 diabetes mellitus with unspecified complications: Secondary | ICD-10-CM | POA: Diagnosis not present

## 2016-09-18 DIAGNOSIS — Z760 Encounter for issue of repeat prescription: Secondary | ICD-10-CM | POA: Diagnosis not present

## 2016-09-20 DIAGNOSIS — R1310 Dysphagia, unspecified: Secondary | ICD-10-CM | POA: Diagnosis not present

## 2016-09-25 DIAGNOSIS — F329 Major depressive disorder, single episode, unspecified: Secondary | ICD-10-CM | POA: Diagnosis not present

## 2016-10-02 DIAGNOSIS — F329 Major depressive disorder, single episode, unspecified: Secondary | ICD-10-CM | POA: Diagnosis not present

## 2016-10-09 DIAGNOSIS — F329 Major depressive disorder, single episode, unspecified: Secondary | ICD-10-CM | POA: Diagnosis not present

## 2016-10-16 DIAGNOSIS — F329 Major depressive disorder, single episode, unspecified: Secondary | ICD-10-CM | POA: Diagnosis not present

## 2016-10-17 DIAGNOSIS — M25572 Pain in left ankle and joints of left foot: Secondary | ICD-10-CM | POA: Diagnosis not present

## 2016-10-17 DIAGNOSIS — M25672 Stiffness of left ankle, not elsewhere classified: Secondary | ICD-10-CM | POA: Diagnosis not present

## 2016-10-17 DIAGNOSIS — G8929 Other chronic pain: Secondary | ICD-10-CM | POA: Diagnosis not present

## 2016-10-25 DIAGNOSIS — F329 Major depressive disorder, single episode, unspecified: Secondary | ICD-10-CM | POA: Diagnosis not present

## 2016-10-26 ENCOUNTER — Ambulatory Visit (INDEPENDENT_AMBULATORY_CARE_PROVIDER_SITE_OTHER): Payer: 59 | Admitting: Podiatry

## 2016-10-26 ENCOUNTER — Encounter: Payer: Self-pay | Admitting: Podiatry

## 2016-10-26 VITALS — BP 135/80 | HR 98 | Resp 16

## 2016-10-26 DIAGNOSIS — L84 Corns and callosities: Secondary | ICD-10-CM | POA: Diagnosis not present

## 2016-10-26 DIAGNOSIS — M722 Plantar fascial fibromatosis: Secondary | ICD-10-CM

## 2016-10-26 DIAGNOSIS — M779 Enthesopathy, unspecified: Secondary | ICD-10-CM | POA: Diagnosis not present

## 2016-10-30 ENCOUNTER — Telehealth: Payer: Self-pay | Admitting: *Deleted

## 2016-10-30 DIAGNOSIS — F329 Major depressive disorder, single episode, unspecified: Secondary | ICD-10-CM | POA: Diagnosis not present

## 2016-10-30 NOTE — Telephone Encounter (Signed)
Pt . [name] it is ok to order her Orthotics. She called to speak with you.

## 2016-11-05 DIAGNOSIS — E559 Vitamin D deficiency, unspecified: Secondary | ICD-10-CM | POA: Diagnosis not present

## 2016-11-05 DIAGNOSIS — E1129 Type 2 diabetes mellitus with other diabetic kidney complication: Secondary | ICD-10-CM | POA: Diagnosis not present

## 2016-11-05 DIAGNOSIS — D508 Other iron deficiency anemias: Secondary | ICD-10-CM | POA: Diagnosis not present

## 2016-11-05 DIAGNOSIS — N08 Glomerular disorders in diseases classified elsewhere: Secondary | ICD-10-CM | POA: Diagnosis not present

## 2016-11-05 DIAGNOSIS — K222 Esophageal obstruction: Secondary | ICD-10-CM | POA: Diagnosis not present

## 2016-11-05 DIAGNOSIS — E784 Other hyperlipidemia: Secondary | ICD-10-CM | POA: Diagnosis not present

## 2016-11-05 DIAGNOSIS — R2689 Other abnormalities of gait and mobility: Secondary | ICD-10-CM | POA: Diagnosis not present

## 2016-11-05 DIAGNOSIS — E11621 Type 2 diabetes mellitus with foot ulcer: Secondary | ICD-10-CM | POA: Diagnosis not present

## 2016-11-05 DIAGNOSIS — E890 Postprocedural hypothyroidism: Secondary | ICD-10-CM | POA: Diagnosis not present

## 2016-11-05 DIAGNOSIS — M79672 Pain in left foot: Secondary | ICD-10-CM | POA: Diagnosis not present

## 2016-11-05 DIAGNOSIS — I10 Essential (primary) hypertension: Secondary | ICD-10-CM | POA: Diagnosis not present

## 2016-11-05 NOTE — Progress Notes (Signed)
Patient ID: [name], female   DOB: 11-03-1966, 50 y.o.   MRN: [id number]  Subjective: 50 year old female presents today for follow-up evaluation of a wound to the left hallux. She states that she is doing well she has not noticed any open sores. She denies any redness or drainage or any swelling to her toe. She says there is just a callus of the toe.  She has been discharged from the care of Dr. Doran Durand for her left ankle surgery and she has completed physical therapy. She said that she's had some improvement to her ankle but she still gets some discomfort and she feels that she does roll her foot out when she walks. She has tried changing her shoes as was well.   Denies any systemic complaints such as fevers, chills, nausea, vomiting. No acute changes since last appointment, and no other complaints at this time.   Objective: AAO x3, NAD DP/PT pulses palpable bilaterally, CRT less than 3 seconds Incision from her surgery is healed with a scar. There is no tenderness to palpation.  There is a hyperkeratotic lesion present on the plantar aspect of the left hallux. Upon debridement there is no underlying ulceration, drainage or any signs of infection. There is no edema, erythema, increase in warmth. There is no questions or crepitus or malodor. There is no other open lesions or pre-ulcer lesions identified today. She does tend to roll her ankle out when she walks. Subjective she also has some tenderness on the arch of her foot as well on the plantar fascia however she has not expanded to a tenderness today. There is no gross ankle instability present on the left ankle. No pain with calf compression, swelling, warmth, erythema  Assessment: Pre-ulcerative lesion to the hallux; plantar fasciitis/gait changes  Plan: -All treatment options discussed with the patient including all alternatives, risks, complications.  -Hyperkerotic lesion was debrided without complications or bleeding.  Continue offloading. -At this point given her wound as well as her biomechanical changes I do recommend orthotics C distal help. She wished to proceed with this as well. She was scanned for orthotics and they were sent to [facility] labs. -Monitor for signs or symptoms of reoccurring infection. Call the office sooner should any occur. Follow-up as scheduled or sooner if needed.   Celesta Gentile, DPM

## 2016-11-07 DIAGNOSIS — F329 Major depressive disorder, single episode, unspecified: Secondary | ICD-10-CM | POA: Diagnosis not present

## 2016-11-19 DIAGNOSIS — F329 Major depressive disorder, single episode, unspecified: Secondary | ICD-10-CM | POA: Diagnosis not present

## 2016-11-19 DIAGNOSIS — E890 Postprocedural hypothyroidism: Secondary | ICD-10-CM | POA: Diagnosis not present

## 2016-11-19 DIAGNOSIS — C73 Malignant neoplasm of thyroid gland: Secondary | ICD-10-CM | POA: Diagnosis not present

## 2016-11-23 ENCOUNTER — Encounter: Payer: Self-pay | Admitting: Podiatry

## 2016-11-23 ENCOUNTER — Ambulatory Visit (INDEPENDENT_AMBULATORY_CARE_PROVIDER_SITE_OTHER): Payer: 59 | Admitting: Podiatry

## 2016-11-23 DIAGNOSIS — L84 Corns and callosities: Secondary | ICD-10-CM | POA: Diagnosis not present

## 2016-11-23 DIAGNOSIS — E1140 Type 2 diabetes mellitus with diabetic neuropathy, unspecified: Secondary | ICD-10-CM | POA: Diagnosis not present

## 2016-11-23 NOTE — Patient Instructions (Signed)

## 2016-11-26 DIAGNOSIS — F329 Major depressive disorder, single episode, unspecified: Secondary | ICD-10-CM | POA: Diagnosis not present

## 2016-11-27 NOTE — Progress Notes (Signed)
Patient ID: [name], female   DOB: 1966/07/02, 50 y.o.   MRN: [id number]  Subjective: 50 year old female presents today to pick up orthotics. Also she has a callus on the left hallux that she would like to have trimmed today. Denies any opening, swelling, redness to the area.  She has been discharged from the care of Dr. Doran Durand for her left ankle surgery and she has completed physical therapy. She said that she's had some improvement to her ankle but she still gets some discomfort and she feels that she does roll her foot out when she walks. She has tried changing her shoes as was well.   Denies any systemic complaints such as fevers, chills, nausea, vomiting. No acute changes since last appointment, and no other complaints at this time.   Objective: AAO x3, NAD DP/PT pulses palpable bilaterally, CRT less than 3 seconds Incision from her surgery is healed with a scar. There is no tenderness to palpation.  There is a hyperkeratotic lesion present on the plantar aspect of the left hallux. Upon debridement there is no underlying ulceration, drainage or any signs of infection. There is no edema, erythema, increase in warmth. There is no questions or crepitus or malodor. There is no other open lesions or pre-ulcer lesions identified today. No pain with calf compression, swelling, warmth, erythema  Assessment: Pre-ulcerative lesion to the hallux; PUO  Plan: -All treatment options discussed with the patient including all alternatives, risks, complications.  -Hyperkerotic lesion was debrided without complications or bleeding. Continue offloading. -Orthotics were dispensed today. Oral and written break-in instructions were discussed. Hopefully the orthotics may help with some of her gait instability and pain.  -Monitor for signs or symptoms of reoccurring infection. Call the office sooner should any occur. Follow-up as scheduled or sooner if needed.   Celesta Gentile, DPM

## 2016-12-04 DIAGNOSIS — M797 Fibromyalgia: Secondary | ICD-10-CM | POA: Diagnosis not present

## 2016-12-04 DIAGNOSIS — E785 Hyperlipidemia, unspecified: Secondary | ICD-10-CM | POA: Diagnosis not present

## 2016-12-04 DIAGNOSIS — R000 Tachycardia, unspecified: Secondary | ICD-10-CM | POA: Diagnosis not present

## 2016-12-04 DIAGNOSIS — D134 Benign neoplasm of liver: Secondary | ICD-10-CM | POA: Diagnosis not present

## 2016-12-04 DIAGNOSIS — N181 Chronic kidney disease, stage 1: Secondary | ICD-10-CM | POA: Diagnosis not present

## 2016-12-04 DIAGNOSIS — E118 Type 2 diabetes mellitus with unspecified complications: Secondary | ICD-10-CM | POA: Diagnosis not present

## 2016-12-04 DIAGNOSIS — I129 Hypertensive chronic kidney disease with stage 1 through stage 4 chronic kidney disease, or unspecified chronic kidney disease: Secondary | ICD-10-CM | POA: Diagnosis not present

## 2016-12-04 DIAGNOSIS — B349 Viral infection, unspecified: Secondary | ICD-10-CM | POA: Diagnosis not present

## 2016-12-04 DIAGNOSIS — R809 Proteinuria, unspecified: Secondary | ICD-10-CM | POA: Diagnosis not present

## 2016-12-06 ENCOUNTER — Ambulatory Visit
Admission: RE | Admit: 2016-12-06 | Discharge: 2016-12-06 | Disposition: A | Discharge status: Home / Self Care | Payer: 59 | Source: Ambulatory Visit | Attending: Nephrology | Admitting: Nephrology

## 2016-12-06 ENCOUNTER — Other Ambulatory Visit: Payer: Self-pay | Admitting: Nephrology

## 2016-12-06 DIAGNOSIS — R748 Abnormal levels of other serum enzymes: Secondary | ICD-10-CM

## 2016-12-06 DIAGNOSIS — R945 Abnormal results of liver function studies: Secondary | ICD-10-CM | POA: Diagnosis not present

## 2016-12-07 ENCOUNTER — Other Ambulatory Visit: Payer: Self-pay

## 2016-12-24 DIAGNOSIS — F329 Major depressive disorder, single episode, unspecified: Secondary | ICD-10-CM | POA: Diagnosis not present

## 2017-01-01 DIAGNOSIS — F329 Major depressive disorder, single episode, unspecified: Secondary | ICD-10-CM | POA: Diagnosis not present

## 2017-01-02 DIAGNOSIS — E108 Type 1 diabetes mellitus with unspecified complications: Secondary | ICD-10-CM | POA: Diagnosis not present

## 2017-01-04 ENCOUNTER — Ambulatory Visit (INDEPENDENT_AMBULATORY_CARE_PROVIDER_SITE_OTHER): Payer: 59 | Admitting: Podiatry

## 2017-01-04 ENCOUNTER — Encounter: Payer: Self-pay | Admitting: Podiatry

## 2017-01-04 DIAGNOSIS — E1140 Type 2 diabetes mellitus with diabetic neuropathy, unspecified: Secondary | ICD-10-CM

## 2017-01-04 DIAGNOSIS — L84 Corns and callosities: Secondary | ICD-10-CM | POA: Diagnosis not present

## 2017-01-04 DIAGNOSIS — M6281 Muscle weakness (generalized): Secondary | ICD-10-CM

## 2017-01-04 DIAGNOSIS — R29898 Other symptoms and signs involving the musculoskeletal system: Secondary | ICD-10-CM

## 2017-01-07 DIAGNOSIS — F329 Major depressive disorder, single episode, unspecified: Secondary | ICD-10-CM | POA: Diagnosis not present

## 2017-01-09 ENCOUNTER — Telehealth: Payer: Self-pay | Admitting: *Deleted

## 2017-01-09 DIAGNOSIS — E1149 Type 2 diabetes mellitus with other diabetic neurological complication: Principal | ICD-10-CM

## 2017-01-09 DIAGNOSIS — E1140 Type 2 diabetes mellitus with diabetic neuropathy, unspecified: Secondary | ICD-10-CM

## 2017-01-09 NOTE — Telephone Encounter (Addendum)
Pt states she has questions testing ordered by Dr. Jacqualyn Posey. 01/09/2017-Pt states Dr. Minna Merritts office staff states there was her paper work in with another pt's and they wanted to know if she needed an appt. I told pt I had ordered the NCV with EMG with [facility], and would refax to them. 01/11/2017-DrJacqualyn Posey states refer pt to Dr. Posey Pronto at [facility]. Faxed referral, demographics and clinicals to [facility].

## 2017-01-09 NOTE — Progress Notes (Signed)
Patient ID: [name], female   DOB: [birth year], 51 y.o.   MRN: [id number]  Subjective: 51 year old female presents today for follow-up of left hallux pre-ulcerative callus. She states she has not noticed any skin breakdown recently. Denies any increase in swelling or redness.   She has been wearing orthotics and doing daily exercises at home for her ankle. She feels that her ankle is not getting any stronger. She is getting frustrated because she is tripping as she has difficulty pulling her foot up at times. She gets numbness/tingling to her leg as well.   Denies any systemic complaints such as fevers, chills, nausea, vomiting. No acute changes since last appointment, and no other complaints at this time.   Objective: AAO x3, NAD DP/PT pulses palpable bilaterally, CRT less than 3 seconds Incision from her surgery is healed with a scar. There is no tenderness to palpation. There is general weakness of the left ankle in dorsiflexion. Upon gait evaluation she is not dragging her foot. She does not wear a brace.  Hyperkeratotic lesion left plantar hallux. Upon debridement there is no underlying ulceration, drainage, or signs of infection.  No pain with calf compression, swelling, warmth, erythema  Assessment: Pre-ulcerative lesion to the hallux; left ankle weakness  Plan: -All treatment options discussed with the patient including all alternatives, risks, complications.  -Hyperkerotic lesion was debrided without complications or bleeding. Continue offloading. -Continue home rehab exercises for the ankle to help prevent it from getting any weaker. Discussed long term she may need a brace but I would like to hold off on that now. Given nerve symptoms will also order a NCV test to rule out any neurological pathology which may be affecting her left lower extremity.   -Monitor for signs or symptoms of reoccurring infection. Call the office sooner should any occur. - Follow-up as  scheduled or sooner if needed.   Celesta Gentile, DPM

## 2017-01-10 ENCOUNTER — Other Ambulatory Visit: Payer: Self-pay | Admitting: *Deleted

## 2017-01-10 ENCOUNTER — Ambulatory Visit (INDEPENDENT_AMBULATORY_CARE_PROVIDER_SITE_OTHER): Payer: 59 | Admitting: Neurology

## 2017-01-10 DIAGNOSIS — G629 Polyneuropathy, unspecified: Secondary | ICD-10-CM

## 2017-01-10 NOTE — Procedures (Signed)
[facility]  [address], [address]  [place], [place] [address] Tel: [phone] Fax:  [phone] Test Date:  01/10/2017  Patient: [name] DOB: 16-Sep-1966 Physician: Narda Amber, DO  Sex: Female Height: 5\' 6"  Ref Phys: Celesta Gentile, DPM  ID#: [id number] Temp: 33.0C Technician: Jerilynn Mages. Dean   Patient Complaints: This is a 51 year old female with history of L5-S1 discectomy referred for evaluation of left foot pain and weakness.  NCV & EMG Findings: Extensive electrodiagnostic testing of the left lower extremity and additional studies of the right shows:  1. Bilateral sural and superficial peroneal sensory responses are absent. 2. Bilateral peroneal (EDB) and tibial motor responses are absent. Bilateral peroneal motor responses recording at the tibialis anterior showed reduced amplitude. 3. Bilateral tibial H reflex studies are absent. 4. Active on chronic motor axon loss changes are seen affecting muscles below the knee bilaterally and worse on the left, with there is no motor unit recruitment in the left gastrocnemius, flexor digitorum longus, and fibularis longus muscles. Chronic motor axon loss changes are also seen affecting bilateral rectus femoris muscles.  Bilateral biceps femoris short head and gluteus medius muscles showed normal configuration and recruitment pattern.  Impression: The electrophysiologic findings are most consistent with a subacute sensorimotor polyneuropathy, axon loss in type, affecting the lower extremities and worse on the left. Overall, these findings are severe in degree electrically.   ___________________________ Narda Amber, DO    Nerve Conduction Studies Anti Sensory Summary Table   Stim Site NR Peak (ms) Norm Peak (ms) P-T Amp (V) Norm P-T Amp  Left Sup Peroneal Anti Sensory (Ant Lat Mall)  12 cm NR  <4.5  >5  Right Sup Peroneal Anti Sensory (Ant Lat Mall)  12 cm NR  <4.5  >5  Left Sural Anti Sensory (Lat Mall)    Calf NR  <4.5  >5  Right Sural Anti Sensory (Lat Mall)  Calf NR  <4.5  >5   Motor Summary Table   Stim Site NR Onset (ms) Norm Onset (ms) O-P Amp (mV) Norm O-P Amp Site1 Site2 Delta-0 (ms) Dist (cm) Vel (m/s) Norm Vel (m/s)  Left Peroneal Motor (Ext Dig Brev)  Ankle NR  <5.5  >3 B Fib Ankle  0.0  >40  B Fib NR     Poplt B Fib  0.0  >40  Poplt NR            Right Peroneal Motor (Ext Dig Brev)  Ankle NR  <5.5  >3 B Fib Ankle  0.0  >40  B Fib NR     Poplt B Fib  0.0  >40  Poplt NR            Left Peroneal TA Motor (Tib Ant)  Fib Head    3.1 <4.0 2.4 >4 Poplit Fib Head 2.1 10.0 48 >40  Poplit    5.2  1.8         Right Peroneal TA Motor (Tib Ant)  Fib Head    2.9 <4.0 2.8 >4 Poplit Fib Head 1.8 10.0 56 >40  Poplit    4.7  2.8         Left Tibial Motor (Abd Hall Brev)  Ankle NR  <6.0  >8 Knee Ankle  0.0  >40  Knee NR 13.8  0.0         Right Tibial Motor (Abd Hall Brev)  Ankle NR  <6.0  >8 Knee Ankle  0.0  >40  Knee NR  H Reflex Studies   NR H-Lat (ms) Lat Norm (ms) L-R H-Lat (ms)  Left Tibial (Gastroc)  NR  <35   Right Tibial (Gastroc)  NR  <35    EMG   Side Muscle Ins Act Fibs Psw Fasc Number Recrt Dur Dur. Amp Amp. Poly Poly. Comment  Left AntTibialis Nml Nml 1+ Nml 3- Rapid Many 1+ Many 1+ Many 1+ N/A  Left Gastroc Nml 1+ Nml Nml NE None - - - - - - N/A  Left Flex Dig Long Nml 2+ Nml Nml NE None - - - - - - N/A  Left RectFemoris Nml Nml Nml Nml 1- Rapid Some 1+ Some 1+ Nml Nml N/A  Left GluteusMed Nml Nml Nml Nml Nml Nml Nml Nml Nml Nml Nml Nml N/A  Left BicepsFemS Nml Nml Nml Nml Nml Nml Nml Nml Nml Nml Nml Nml N/A  Left Fibularis Long Nml 2+ Nml Nml NE None - - - - - - N/A  Right Gastroc Nml 1+ Nml Nml 3- Rapid Some 1+ Some 1+ Nml Nml N/A  Right Flex Dig Long Nml 1+ Nml Nml 3- Rapid Most 1+ Most 1+ Nml Nml N/A  Right RectFemoris Nml Nml Nml Nml 1- Rapid Some 1+ Some 1+ Nml Nml N/A  Right GluteusMed Nml Nml Nml Nml Nml Nml Nml Nml Nml Nml Nml Nml N/A   Right BicepsFemS Nml Nml Nml Nml Nml Nml Nml Nml Nml Nml Nml Nml N/A  Right AntTibialis Nml 1+ Nml Nml 3- Rapid Some 1+ Many 1+ Some 1+ N/A      Waveforms:

## 2017-01-11 ENCOUNTER — Telehealth: Payer: Self-pay | Admitting: Podiatry

## 2017-01-11 NOTE — Telephone Encounter (Signed)
I called the patient back to discuss the results. Will place a neurology consult. Patient would like to proceed as well.

## 2017-01-11 NOTE — Telephone Encounter (Signed)
Pt was returning call 

## 2017-01-15 DIAGNOSIS — Z1389 Encounter for screening for other disorder: Secondary | ICD-10-CM | POA: Diagnosis not present

## 2017-01-15 DIAGNOSIS — Z0000 Encounter for general adult medical examination without abnormal findings: Secondary | ICD-10-CM | POA: Diagnosis not present

## 2017-01-15 DIAGNOSIS — R269 Unspecified abnormalities of gait and mobility: Secondary | ICD-10-CM | POA: Diagnosis not present

## 2017-01-15 DIAGNOSIS — D509 Iron deficiency anemia, unspecified: Secondary | ICD-10-CM | POA: Diagnosis not present

## 2017-01-15 DIAGNOSIS — E509 Vitamin A deficiency, unspecified: Secondary | ICD-10-CM | POA: Diagnosis not present

## 2017-01-15 DIAGNOSIS — E1142 Type 2 diabetes mellitus with diabetic polyneuropathy: Secondary | ICD-10-CM | POA: Diagnosis not present

## 2017-01-15 DIAGNOSIS — E559 Vitamin D deficiency, unspecified: Secondary | ICD-10-CM | POA: Diagnosis not present

## 2017-01-15 DIAGNOSIS — D126 Benign neoplasm of colon, unspecified: Secondary | ICD-10-CM | POA: Diagnosis not present

## 2017-01-15 DIAGNOSIS — E1129 Type 2 diabetes mellitus with other diabetic kidney complication: Secondary | ICD-10-CM | POA: Diagnosis not present

## 2017-01-15 DIAGNOSIS — E784 Other hyperlipidemia: Secondary | ICD-10-CM | POA: Diagnosis not present

## 2017-01-15 DIAGNOSIS — N08 Glomerular disorders in diseases classified elsewhere: Secondary | ICD-10-CM | POA: Diagnosis not present

## 2017-01-15 DIAGNOSIS — E890 Postprocedural hypothyroidism: Secondary | ICD-10-CM | POA: Diagnosis not present

## 2017-01-15 DIAGNOSIS — E11621 Type 2 diabetes mellitus with foot ulcer: Secondary | ICD-10-CM | POA: Diagnosis not present

## 2017-01-15 DIAGNOSIS — C73 Malignant neoplasm of thyroid gland: Secondary | ICD-10-CM | POA: Diagnosis not present

## 2017-01-17 DIAGNOSIS — M25572 Pain in left ankle and joints of left foot: Secondary | ICD-10-CM | POA: Diagnosis not present

## 2017-01-17 DIAGNOSIS — M25672 Stiffness of left ankle, not elsewhere classified: Secondary | ICD-10-CM | POA: Diagnosis not present

## 2017-01-17 DIAGNOSIS — G8929 Other chronic pain: Secondary | ICD-10-CM | POA: Diagnosis not present

## 2017-01-18 ENCOUNTER — Encounter: Payer: Self-pay | Admitting: Internal Medicine

## 2017-01-18 ENCOUNTER — Ambulatory Visit (INDEPENDENT_AMBULATORY_CARE_PROVIDER_SITE_OTHER): Payer: 59 | Admitting: Internal Medicine

## 2017-01-18 VITALS — BP 121/82 | HR 88 | Ht 66.0 in | Wt 281.8 lb

## 2017-01-18 DIAGNOSIS — I951 Orthostatic hypotension: Secondary | ICD-10-CM

## 2017-01-18 DIAGNOSIS — R55 Syncope and collapse: Secondary | ICD-10-CM

## 2017-01-18 DIAGNOSIS — R002 Palpitations: Secondary | ICD-10-CM

## 2017-01-18 NOTE — Progress Notes (Signed)
ELECTROPHYSIOLOGY CONSULT NOTE  Patient ID: [name], MRN: [id number], DOB/AGE: 1966/06/04 51 y.o. Admit date: (Not on file) Date of Consult: 01/18/2017  Primary Physician: [name], MD Primary Cardiologist: new Consulting Physician SS  Chief Complaint: Spells   HPI [name] is a 51 y.o. female referred by Dr. Carrolyn Meiers because of spells. She has a long-standing history of hypertension requiring multiple medications as well as diabetes for about 20 years. She has some issues of gastroparesis suggestive of autonomic neuropathy.  She saw Dr. Bing Quarry years and years ago because of tachycardia in the context of her hypertension. No underlying cause was identified. She ended up on beta blockers with a significant improvement in her symptoms. She then had more problems with tachycardia associated with exertion and her beta blockers had been uptitrated with some interval improvement.  Over the last year or 2 she is also had episodes of orthostatic intolerance.  These have been accompanied by shower intolerance and heat intolerance.  About a week or 2 ago she had one of these episodes getting up from the couch and losing consciousness. There was significant residual orthostatic intolerance. She was described by her husband being pale and cool.  She's also had episodes over the last 2 years of increasing frequency of abrupt episodes of tachypalpitations associated with nausea and pallor diaphoresis and flushing. At least on one occasion her  blood pressure was noted to be in the 80s. I did not get a measurement of her heart rate. They're associated with significant residual fatigue.  As noted above she has significant exercise intolerance. There is no accompanying chest pain.        Past Medical History:  Diagnosis Date  . Anginal pain ([place])    chest pain  heart cath done  . Cancer ([place])   . Chronic bronchitis ([place])    "get it q yr"    . Fibromyalgia   . GERD (gastroesophageal reflux disease)   . Hepatic adenoma   . High cholesterol   . History of gout   . History of hiatal hernia   . Hypertension   . Hypothyroidism   . Iron deficiency anemia    "comes and goes"  . Kidney stones   . Migraine    "@ least once/month" (02/18/2015)  . PONV (postoperative nausea and vomiting)   . Proteinuria   . Tachycardia   . Type II diabetes mellitus ([place])       Surgical History:  Past Surgical History:  Procedure Laterality Date  . ABDOMINAL HYSTERECTOMY  ~2012   "lap"  . ANKLE RECONSTRUCTION Left 03/29/2016   Procedure: LEFT ANKLE LATERAL LIGAMENT RECONSTRUCTION AND PERONEAL TENDON REPAIR OR TENOLYSIS;  Surgeon: Wylene Simmer, MD;  Location: [facility];  Service: Orthopedics;  Laterality: Left;  . BACK SURGERY    . CARDIAC CATHETERIZATION  04/2009   "clean"  . COLONOSCOPY    . CYSTOSCOPY WITH URETEROSCOPY, STONE BASKETRY AND STENT PLACEMENT  ~ 2006  . ESOPHAGOGASTRODUODENOSCOPY    . LAPAROSCOPIC CHOLECYSTECTOMY  05/2004  . LUMBAR DISC SURGERY  08/2011  . THYROIDECTOMY N/A 02/18/2015   Procedure: TOTAL THYROIDECTOMY;  Surgeon: [name], MD;  Location: [facility];  Service: General;  Laterality: N/A;  . TOTAL THYROIDECTOMY  02/18/2015     Home Meds: Prior to Admission medications   Medication Sig Start Date End Date Taking? Authorizing Provider  acetaminophen (TYLENOL) 500 MG tablet Take 1,000 mg by mouth every 6 (six) hours  as needed for mild pain.   Yes Historical Provider, MD  ALPRAZolam Duanne Moron) 0.5 MG tablet Take 0.5 mg by mouth every 6 (six) hours as needed for anxiety or sleep.    Yes Historical Provider, MD  ammonium lactate (LAC-HYDRIN) 12 % lotion Apply 1 application topically as needed for dry skin.   Yes Historical Provider, MD  aspirin EC 81 MG tablet Take 1 tablet (81 mg total) by mouth 2 (two) times daily. 03/29/16  Yes Marya Amsler Ollis, PA-C  atorvastatin (LIPITOR) 20 MG tablet Take 20 mg by mouth daily at 6 PM.    Yes  Historical Provider, MD  BAYER CONTOUR NEXT TEST test strip  08/06/14  Yes Historical Provider, MD  Biotin (BIOTIN 5000) 5 MG CAPS Take 5 mg by mouth at bedtime.   Yes Historical Provider, MD  cyclobenzaprine (FLEXERIL) 10 MG tablet Take 10 mg by mouth at bedtime as needed for muscle spasms.    Yes Historical Provider, MD  Dermatological Products, [place]. (HYLATOPIC PLUS) CREA Apply 1 application topically 4 (four) times daily.   Yes Historical Provider, MD  dexlansoprazole (DEXILANT) 60 MG capsule Take 60 mg by mouth every morning. 0600   Yes Historical Provider, MD  fluticasone (FLONASE) 50 MCG/ACT nasal spray Place 1 spray into the nose daily. Reported on 12/27/2015   Yes Historical Provider, MD  furosemide (LASIX) 40 MG tablet Take 40 mg by mouth 2 (two) times daily.     Yes Historical Provider, MD  gabapentin (NEURONTIN) 300 MG capsule TAKE 1 CAPSULE (300 MG TOTAL) BY MOUTH AT BEDTIME.   Yes [name], DPM  halobetasol (ULTRAVATE) 0.05 % cream Apply 1 application topically 2 (two) times daily.   Yes Historical Provider, MD  HYDROcodone-acetaminophen (NORCO) 7.5-325 MG tablet Take 1 tablet by mouth as needed for pain. 05/08/16  Yes Historical Provider, MD  hyoscyamine (LEVSIN SL) 0.125 MG SL tablet Place 0.125 mg under the tongue every 4 (four) hours as needed for cramping.   Yes Historical Provider, MD  ibuprofen (ADVIL,MOTRIN) 200 MG tablet Take 600-800 mg by mouth every 8 (eight) hours as needed for moderate pain.   Yes Historical Provider, MD  insulin regular human CONCENTRATED (HUMULIN R) 500 UNIT/ML SOLN injection Inject 2 Units into the skin. 2.0 units /hour 0500-2359, 1.5units [id number]    Yes Historical Provider, MD  INVOKAMET 50-1000 MG TABS Take 1 tablet by mouth 2 (two) times daily.  10/20/14  Yes Historical Provider, MD  levothyroxine (SYNTHROID, LEVOTHROID) 125 MCG tablet Take 250 mcg by mouth daily before breakfast. 250 mcg daily except 125 mcg on one day per week   Yes Historical  Provider, MD  lisinopril (PRINIVIL,ZESTRIL) 40 MG tablet Take 40 mg by mouth 2 (two) times daily.    Yes Historical Provider, MD  metoprolol (TOPROL-XL) 50 MG 24 hr tablet Take 100 mg by mouth 2 (two) times daily.    Yes Historical Provider, MD  metroNIDAZOLE (METROCREAM) 0.75 % cream Apply 1 application topically 2 (two) times daily.   Yes Historical Provider, MD  Multiple Vitamins-Minerals (MULTIVITAMIN PO) Take 2 tablets by mouth at bedtime. gummies   Yes Historical Provider, MD  Nutritional Supplements (VITAMIN D BOOSTER PO) Take 50,000 Units by mouth daily.   Yes Historical Provider, MD  ondansetron (ZOFRAN-ODT) 8 MG disintegrating tablet Take 8 mg by mouth every 6 (six) hours as needed for nausea or vomiting.    Yes Historical Provider, MD  Oxymetazoline HCl (RHOFADE) 1 % CREA Apply 1 application topically  as needed (for skin irritation).   Yes Historical Provider, MD  PROCTOSOL HC 2.5 % rectal cream Apply 1 application topically daily as needed for hemorrhoids.  12/28/15  Yes Historical Provider, MD  triamcinolone ointment (KENALOG) 0.1 % Apply 1 application topically as needed (for irritation).   Yes Historical Provider, MD    Allergies:  Allergies  Allergen Reactions  . Penicillins Anaphylaxis, Shortness Of Breath, Swelling and Palpitations    Has patient had a PCN reaction causing immediate rash, facial/tongue/throat swelling, SOB or lightheadedness with hypotension: Yes Has patient had a PCN reaction causing severe rash involving mucus membranes or skin necrosis: No Has patient had a PCN reaction that required hospitalization No Has patient had a PCN reaction occurring within the last 10 years: No If all of the above answers are "NO", then may proceed with Cephalosporin use.   . Adhesive [Tape] Itching    Redness  . Clarithromycin Nausea And Vomiting    Any antibiotic (doxyclycine, etc)  . Milk-Related Compounds Other (See Comments)    Patient preference, does not like  .  Moxifloxacin Nausea And Vomiting    Needs Zofran or Phenergan  . Latex Itching and Rash    Blisters with prolonged contact    Social History   Social History  . Marital status: Married    Spouse name: N/A  . Number of children: N/A  . Years of education: N/A   Occupational History  . Not on file.   Social History Main Topics  . Smoking status: Never Smoker  . Smokeless tobacco: Never Used  . Alcohol use No  . Drug use: No  . Sexual activity: Yes   Other Topics Concern  . Not on file   Social History Narrative  . No narrative on file     Family History  Problem Relation Age of Onset  . Hypertension Mother   . Emphysema Mother   . AAA (abdominal aortic aneurysm) Mother   . Hypertension Father   . Coronary artery disease Father   . Cancer - Prostate Father   . Renal Disease Father   . Hypertension Brother      ROS:  Please see the history of present illness.     All other systems reviewed and negative.    Physical Exam:  Blood pressure 121/82, pulse 88, height 5\' 6"  (1.676 m), weight 281 lb 12.8 oz (127.8 kg), SpO2 96 %. General: Well developed, Morbidly obese  female in no acute distress. Head: Normocephalic, atraumatic, sclera non-icteric, no xanthomas, nares are without discharge. EENT: normal  Lymph Nodes:  none Neck: Negative for carotid bruits. JVD not elevated. Surgical scar Back:without scoliosis kyphosis Lungs: Clear bilaterally to auscultation without wheezes, rales, or rhonchi. Breathing is unlabored. Heart: RRR with S1 S2. No  murmur . No rubs, or gallops appreciated. Abdomen: Soft, non-tender, non-distended with normoactive bowel sounds. No hepatomegaly. No rebound/guarding. No obvious abdominal masses. Msk:  Strength and tone appear normal for age. Extremities: No clubbing or cyanosis. No edema.  Distal pedal pulses are 2+ and equal bilaterally.  Skin: Warm and Dry Neuro: Alert and oriented X 3. CN III-XII intact Grossly normal sensory and motor  function . Walk with a cane  Psych:  Responds to questions appropriately with a normal affect.      Labs: Cardiac Enzymes No results for input(s): CKTOTAL, CKMB, TROPONINI in the last 72 hours. CBC Lab Results  Component Value Date   WBC 6.1 03/29/2016   HGB 13.4 03/29/2016  HCT 40.5 03/29/2016   MCV 92.7 03/29/2016   PLT 194 03/29/2016   PROTIME: No results for input(s): LABPROT, INR in the last 72 hours. Chemistry No results for input(s): NA, K, CL, CO2, BUN, CREATININE, CALCIUM, PROT, BILITOT, ALKPHOS, ALT, AST, GLUCOSE in the last 168 hours.  Invalid input(s): LABALBU Lipids Lab Results  Component Value Date   CHOL  07/01/2009    162        ATP III CLASSIFICATION:  <200     mg/dL   Desirable  200-239  mg/dL   Borderline High  >=240    mg/dL   High          HDL 56 07/01/2009   LDLCALC  07/01/2009    85        Total Cholesterol/HDL:CHD Risk Coronary Heart Disease Risk Table                     Men   Women  1/2 Average Risk   3.4   3.3  Average Risk       5.0   4.4  2 X Average Risk   9.6   7.1  3 X Average Risk  23.4   11.0        Use the calculated Patient Ratio above and the CHD Risk Table to determine the patient's CHD Risk.        ATP III CLASSIFICATION (LDL):  <100     mg/dL   Optimal  100-129  mg/dL   Near or Above                    Optimal  130-159  mg/dL   Borderline  160-189  mg/dL   High  >190     mg/dL   Very High   TRIG 103 07/01/2009   BNP No results found for: PROBNP Thyroid Function Tests: No results for input(s): TSH, T4TOTAL, T3FREE, THYROIDAB in the last 72 hours.  Invalid input(s): FREET3 Miscellaneous Lab Results  Component Value Date   DDIMER (H) 07/02/2009    1.40        AT THE INHOUSE ESTABLISHED CUTOFF VALUE OF 0.48 ug/mL FEU, THIS ASSAY HAS BEEN DOCUMENTED IN THE LITERATURE TO HAVE A SENSITIVITY AND NEGATIVE PREDICTIVE VALUE OF AT LEAST 98 TO 99%.  THE TEST RESULT SHOULD BE CORRELATED WITH AN ASSESSMENT OF THE  CLINICAL PROBABILITY OF DVT / VTE.    Radiology/Studies:  No results found.  EKG: sinus 86 15/08/39   Assessment and Plan:  Spells  Orthostatic hypotension  Syncope  Diabetes  Hypertension  The patient has had recurrent spells of increasing frequency which are characterized by diaphoresis nausea and vomiting and being visibly pallor and cool to touch. There is significant residual fatigue; these epiphenomena are consistent with a neurally mediated episode. Interestingly, there is no clear trigger.  In conjunction she also has symptoms of orthostatic intolerance and borderline measurements of orthostatic hypotension objectively today.  Fluids and salt are the make the most sense however, salt is not an option given her severe hypertension. I suspect that the diabetes and the poor vascular compliance associated with long-standing hypertension contributing to poor orthostatic intolerance.  I suggested compressive clothing either SPANX or abdominal binder.  In addition, been in 2 to the prodrome is essential as becoming horizontal and or sitting will help to prevent the risk of falling. There is no clear trigger; the pounding may represent a sympathetic surge prior to sympathetic withdrawal.  On the other hand, it is reasonable to undertake an event recorder to make sure there is not a arrhythmic trigger.  Exercise intolerance is likely coupled to orthostatic intolerance. This improved with a beta blocker; conditioning is going to be very important for her recovery.        Virl Axe

## 2017-01-18 NOTE — Patient Instructions (Addendum)
Medication Instructions: Your physician recommends that you continue on your current medications as directed. Please refer to the Current Medication list given to you today.  Labwork: None Ordered  Procedures/Testing: - Your physician has requested that you have an echocardiogram. Echocardiography is a painless test that uses sound waves to create images of your heart. It provides your doctor with information about the size and shape of your heart and how well your heart's chambers and valves are working. This procedure takes approximately one hour. There are no restrictions for this procedure.  - Your physician has recommended that you wear an event monitor. Event monitors are medical devices that record the heart's electrical activity. Doctors most often [place] these monitors to diagnose arrhythmias. Arrhythmias are problems with the speed or rhythm of the heartbeat. The monitor is a small, portable device. You can wear one while you do your normal daily activities. This is usually used to diagnose what is causing palpitations/syncope (passing out).   Follow-Up: Your physician recommends that you schedule a follow-up appointment in: 4 Months with Dr. Caryl Comes.   Any Additional Special Instructions Will Be Listed Below (If Applicable).  - Obtain an abdominal binder or Spanx while awake.      If you need a refill on your cardiac medications before your next appointment, please call your pharmacy.

## 2017-01-21 DIAGNOSIS — F329 Major depressive disorder, single episode, unspecified: Secondary | ICD-10-CM | POA: Diagnosis not present

## 2017-01-23 ENCOUNTER — Other Ambulatory Visit: Payer: Self-pay

## 2017-01-23 ENCOUNTER — Ambulatory Visit (HOSPITAL_COMMUNITY): Payer: 59 | Attending: Cardiology

## 2017-01-23 ENCOUNTER — Ambulatory Visit (INDEPENDENT_AMBULATORY_CARE_PROVIDER_SITE_OTHER): Payer: 59

## 2017-01-23 DIAGNOSIS — R002 Palpitations: Secondary | ICD-10-CM

## 2017-01-23 DIAGNOSIS — R55 Syncope and collapse: Secondary | ICD-10-CM | POA: Diagnosis not present

## 2017-01-23 DIAGNOSIS — I10 Essential (primary) hypertension: Secondary | ICD-10-CM | POA: Insufficient documentation

## 2017-01-23 DIAGNOSIS — E785 Hyperlipidemia, unspecified: Secondary | ICD-10-CM | POA: Diagnosis not present

## 2017-01-23 DIAGNOSIS — E119 Type 2 diabetes mellitus without complications: Secondary | ICD-10-CM | POA: Diagnosis not present

## 2017-01-23 DIAGNOSIS — Z6841 Body Mass Index (BMI) 40.0 and over, adult: Secondary | ICD-10-CM | POA: Insufficient documentation

## 2017-01-25 ENCOUNTER — Other Ambulatory Visit (INDEPENDENT_AMBULATORY_CARE_PROVIDER_SITE_OTHER): Payer: 59

## 2017-01-25 ENCOUNTER — Ambulatory Visit (INDEPENDENT_AMBULATORY_CARE_PROVIDER_SITE_OTHER): Payer: 59 | Admitting: Neurology

## 2017-01-25 ENCOUNTER — Encounter: Payer: Self-pay | Admitting: Neurology

## 2017-01-25 VITALS — BP 110/78 | HR 102 | Ht 66.0 in | Wt 279.5 lb

## 2017-01-25 DIAGNOSIS — M5417 Radiculopathy, lumbosacral region: Secondary | ICD-10-CM

## 2017-01-25 DIAGNOSIS — E0842 Diabetes mellitus due to underlying condition with diabetic polyneuropathy: Secondary | ICD-10-CM

## 2017-01-25 DIAGNOSIS — R002 Palpitations: Secondary | ICD-10-CM | POA: Diagnosis not present

## 2017-01-25 DIAGNOSIS — G8314 Monoplegia of lower limb affecting left nondominant side: Secondary | ICD-10-CM

## 2017-01-25 LAB — VITAMIN B12: Vitamin B-12: 256 pg/mL (ref 211–911)

## 2017-01-25 LAB — C-REACTIVE PROTEIN: CRP: 0.3 mg/dL — ABNORMAL LOW (ref 0.5–20.0)

## 2017-01-25 LAB — SEDIMENTATION RATE: Sed Rate: 10 mm/hr (ref 0–20)

## 2017-01-25 NOTE — Patient Instructions (Addendum)
1.  Check blood work 2.  NCS/EMG of the arms 3.  Start physical therapy for left leg strength  We will call you with the results and determine the next step  Return to clinic in 3 months3

## 2017-01-25 NOTE — Progress Notes (Signed)
[facility] Clinic Note - Initial Visit   Date: 01/25/17  [name] MRN: [id number] DOB: 1966/09/24   Dear Dr. Forde Dandy:  Thank you for your kind referral of [name] for consultation of neuropathy. Although her history is well known to you, please allow [place] to reiterate it for the purpose of our medical record. The patient was accompanied to the clinic by self.    History of Present Illness: [name] is a 51 y.o. right-handed Caucasian female with insulin-dependent diabetes mellitus, thyroid cancer s/p resection secondary hypothyroidism (2016), hypertension, hyperlipidemia, GERD, s/p left L5 hemilaminectomy and discectomy, chronic left ankle pain presenting for evaluation of neuropathy.    In 2012, she developed severe radicular left leg and foot pain and foot drop and was found to have left L5-S1 herniated disc and spinal stenosis at this this level.  She has left L5 hemilaminectomy and discectomy by Dr. Arnoldo Morale.  Pain was significantly improved post-op and over the next few months, she was able to extend her toes and foot.  She has residual reduced sensation of the left great toe.    Starting in 2016, she had a spell of night sweats and burning sensation of the body.  She also has SOB and tachycardia and was later found to have thyroid cancer.  She also was treated with radioactive iodine later because her margins were not clean.  By June 2016, she was feeling very fatigued and thought it was due to deconditioning so started walking, but then developed left knee pain.  She attributed this to being overweight and sedentary.  She continued to walk and later left hip pain and left foot pain. Ankle pain was deep and throbbing. She was recommended to immobilize the foot due to possible stress fracture.  She saw Dr. Doran Durand, orthopaedics, who recommended that she wear a cast.  She recalls it being better while wearing a cast, but  never completely resolved. Once the cast was removed, she realized that she had weakness of the foot with eversion, inversion, and dorsiflexion.  Weight bearing exacerbates her pain, so she often stands only on her right leg.   She has failed extensive treatment with PT, bracing, pain meds and immobilization and underwent left ankle surgery with ligament reconstruction and tendonlysis for instability and tendonitis in April 2017.  Unfortuantely, this did not improve her pain and she was referred to pain management for complex regional pain syndrome, however they did not feel that she had this.  She did physical therapy and was able to improve left dorsiflexion some.  Because of ongoing deep, throbbing pain of the left ankle, she was referred for NCS/EMG to be sure nerve etiology was not missed.   Her NCS/EMG showed acute on chronic sensorimotor polyneuropathy affecting both legs, worse on the right.  When asked if she has numbness/tingling/burning pain, she specifically denies this.  She is very surprised that her testing shows abnormalities in her "good leg".    Around summer 2017, she started having new low back pain.  Pain is sharp and localized to her low back and radiates into her left buttocks, but does not radiate into her leg.  She saw Dr. Arnoldo Morale again and MRI lumbar spine which showed small L3-4 disc protrusion near the left L4 nerve root.  Post-op changes at left L5-S1 was stable.  Out-side paper records, electronic medical record, and images have been reviewed where available and summarized as:  NCS/EMG of the legs 01/10/2017:  The electrophysiologic findings are most consistent with a subacute sensorimotor polyneuropathy, axon loss in type, affecting the lower extremities and worse on the left. Overall, these findings are severe in degree electrically.  MRI lumbar spine 05/09/2016: 1. Postoperative changes on the left at L5-S1 with no adverse features. 2. New small L3-L4 disc protrusion into  the left lateral recess at the level of the descending left L4 nerve roots. 3. L4-L5 disc and posterior element degeneration appear stable since 2012 with mild bilateral foraminal stenosis.    Past Medical History:  Diagnosis Date  . Anginal pain ([place])    chest pain  heart cath done  . Cancer ([place])   . Chronic bronchitis ([place])    "get it q yr"  . Fibromyalgia   . GERD (gastroesophageal reflux disease)   . Hepatic adenoma   . High cholesterol   . History of gout   . History of hiatal hernia   . Hypertension   . Hypothyroidism   . Iron deficiency anemia    "comes and goes"  . Kidney stones   . Migraine    "@ least once/month" (02/18/2015)  . PONV (postoperative nausea and vomiting)   . Proteinuria   . Tachycardia   . Type II diabetes mellitus ([place])     Past Surgical History:  Procedure Laterality Date  . ABDOMINAL HYSTERECTOMY  ~2012   "lap"  . ANKLE RECONSTRUCTION Left 03/29/2016   Procedure: LEFT ANKLE LATERAL LIGAMENT RECONSTRUCTION AND PERONEAL TENDON REPAIR OR TENOLYSIS;  Surgeon: Wylene Simmer, MD;  Location: [facility];  Service: Orthopedics;  Laterality: Left;  . BACK SURGERY    . CARDIAC CATHETERIZATION  04/2009   "clean"  . COLONOSCOPY    . CYSTOSCOPY WITH URETEROSCOPY, STONE BASKETRY AND STENT PLACEMENT  ~ 2006  . ESOPHAGOGASTRODUODENOSCOPY    . LAPAROSCOPIC CHOLECYSTECTOMY  05/2004  . LUMBAR DISC SURGERY  08/2011  . THYROIDECTOMY N/A 02/18/2015   Procedure: TOTAL THYROIDECTOMY;  Surgeon: [name], MD;  Location: [facility];  Service: General;  Laterality: N/A;  . TOTAL THYROIDECTOMY  02/18/2015     Medications:  Outpatient Encounter Prescriptions as of 01/25/2017  Medication Sig Note  . acetaminophen (TYLENOL) 500 MG tablet Take 1,000 mg by mouth every 6 (six) hours as needed for mild pain.   Marland Kitchen ALPRAZolam (XANAX) 0.5 MG tablet Take 0.5 mg by mouth every 6 (six) hours as needed for anxiety or sleep.    Marland Kitchen ammonium lactate (LAC-HYDRIN) 12 % lotion Apply 1 application  topically as needed for dry skin.   Marland Kitchen aspirin EC 81 MG tablet Take 1 tablet (81 mg total) by mouth 2 (two) times daily.   Marland Kitchen atorvastatin (LIPITOR) 20 MG tablet Take 20 mg by mouth daily at 6 PM.    . BAYER CONTOUR NEXT TEST test strip    . Biotin (BIOTIN 5000) 5 MG CAPS Take 5 mg by mouth at bedtime.   . cyclobenzaprine (FLEXERIL) 10 MG tablet Take 10 mg by mouth at bedtime as needed for muscle spasms.    . Dermatological Products, Misc. (HYLATOPIC PLUS) CREA Apply 1 application topically 4 (four) times daily.   Marland Kitchen dexlansoprazole (DEXILANT) 60 MG capsule Take 60 mg by mouth every morning. 0600   . fluticasone (FLONASE) 50 MCG/ACT nasal spray Place 1 spray into the nose daily. Reported on 12/27/2015   . furosemide (LASIX) 40 MG tablet Take 40 mg by mouth 2 (two) times daily.     Marland Kitchen gabapentin (NEURONTIN) 300 MG capsule TAKE  1 CAPSULE (300 MG TOTAL) BY MOUTH AT BEDTIME.   . halobetasol (ULTRAVATE) 0.05 % cream Apply 1 application topically 2 (two) times daily.   Marland Kitchen HYDROcodone-acetaminophen (NORCO) 7.5-325 MG tablet Take 1 tablet by mouth as needed for pain.   . hyoscyamine (LEVSIN SL) 0.125 MG SL tablet Place 0.125 mg under the tongue every 4 (four) hours as needed for cramping.   Marland Kitchen ibuprofen (ADVIL,MOTRIN) 200 MG tablet Take 600-800 mg by mouth every 8 (eight) hours as needed for moderate pain.   Marland Kitchen insulin regular human CONCENTRATED (HUMULIN R) 500 UNIT/ML SOLN injection Inject 2 Units into the skin. 2.0 units /hour 0500-2359, 1.5units 5701-7793  12/24/2012: Insulin pump Patient is aware of doses and carbs  . INVOKAMET 50-1000 MG TABS Take 1 tablet by mouth 2 (two) times daily.    Marland Kitchen levothyroxine (SYNTHROID, LEVOTHROID) 125 MCG tablet Take 250 mcg by mouth daily before breakfast. 250 mcg daily except 125 mcg on one day per week   . lisinopril (PRINIVIL,ZESTRIL) 40 MG tablet Take 40 mg by mouth 2 (two) times daily.    . metoprolol (TOPROL-XL) 50 MG 24 hr tablet Take 100 mg by mouth 2 (two) times daily.     . metroNIDAZOLE (METROCREAM) 0.75 % cream Apply 1 application topically 2 (two) times daily.   . Multiple Vitamins-Minerals (MULTIVITAMIN PO) Take 2 tablets by mouth at bedtime. gummies   . Nutritional Supplements (VITAMIN D BOOSTER PO) Take 50,000 Units by mouth daily.   . ondansetron (ZOFRAN-ODT) 8 MG disintegrating tablet Take 8 mg by mouth every 6 (six) hours as needed for nausea or vomiting.    . Oxymetazoline HCl (RHOFADE) 1 % CREA Apply 1 application topically as needed (for skin irritation).   Marland Kitchen PROCTOSOL HC 2.5 % rectal cream Apply 1 application topically daily as needed for hemorrhoids.    . triamcinolone ointment (KENALOG) 0.1 % Apply 1 application topically as needed (for irritation).    No facility-administered encounter medications on file as of 01/25/2017.      Allergies:  Allergies  Allergen Reactions  . Penicillins Anaphylaxis, Shortness Of Breath, Swelling and Palpitations    Has patient had a PCN reaction causing immediate rash, facial/tongue/throat swelling, SOB or lightheadedness with hypotension: Yes Has patient had a PCN reaction causing severe rash involving mucus membranes or skin necrosis: No Has patient had a PCN reaction that required hospitalization No Has patient had a PCN reaction occurring within the last 10 years: No If all of the above answers are "NO", then may proceed with Cephalosporin use.   . Adhesive [Tape] Itching    Redness  . Clarithromycin Nausea And Vomiting    Any antibiotic (doxyclycine, etc)  . Milk-Related Compounds Other (See Comments)    Patient preference, does not like  . Moxifloxacin Nausea And Vomiting    Needs Zofran or Phenergan  . Latex Itching and Rash    Blisters with prolonged contact    Family History: Family History  Problem Relation Age of Onset  . Hypertension Mother   . Emphysema Mother   . AAA (abdominal aortic aneurysm) Mother   . Hypertension Father   . Coronary artery disease Father   . Cancer -  Prostate Father   . Renal Disease Father   . Hypertension Brother     Social History: Social History  Substance Use Topics  . Smoking status: Never Smoker  . Smokeless tobacco: Never Used  . Alcohol use No   Social History   Social History Narrative  Lives with husband in a one story home.  No children.  RN.  Education: college.     Review of Systems:  CONSTITUTIONAL: No fevers, chills, night sweats, or weight loss.   EYES: No visual changes or eye pain ENT: No hearing changes.  No history of nose bleeds.   RESPIRATORY: No cough, wheezing and shortness of breath.   CARDIOVASCULAR: Negative for chest pain, and palpitations.   GI: Negative for abdominal discomfort, blood in stools or black stools.  No recent change in bowel habits.   GU:  No history of incontinence.   MUSCLOSKELETAL: +history of joint pain or swelling.  No myalgias.   SKIN: Negative for lesions, rash, and itching.   HEMATOLOGY/ONCOLOGY: Negative for prolonged bleeding, bruising easily, and swollen nodes.  No history of cancer.   ENDOCRINE: Negative for cold or heat intolerance, polydipsia or goiter.   PSYCH:  No depression or anxiety symptoms.   NEURO: As Above.   Vital Signs:  BP 110/78   Pulse (!) 102   Ht '5\' 6"'  (1.676 m)   Wt 279 lb 8 oz (126.8 kg)   SpO2 97%   BMI 45.11 kg/m    General Medical Exam:   General:  Well appearing, morbidly obese, comfortable.   Eyes/ENT: see cranial nerve examination.   Neck: No masses appreciated.  Full range of motion without tenderness.  No carotid bruits. Respiratory:  Clear to auscultation, good air entry bilaterally.   Cardiac:  Regular rate and rhythm, no murmur.   Extremities:  Absent toe nails in the great toe bilaterally, there is no edema of the legs, no pes cavus  Neurological Exam: MENTAL STATUS including orientation to time, place, person, recent and remote memory, attention span and concentration, language, and fund of knowledge is normal.  Speech is  not dysarthric.  CRANIAL NERVES: II:  No visual field defects.  Unremarkable fundi.   III-IV-VI: Pupils equal round and reactive to light.  Normal conjugate, extra-ocular eye movements in all directions of gaze.  No nystagmus.  No ptosis.   V:  Normal facial sensation.  Jaw jerk is absent.   VII:  Normal facial symmetry and movements.  No pathologic facial reflexes.  VIII:  Normal hearing and vestibular function.   IX-X:  Normal palatal movement.   XI:  Normal shoulder shrug and head rotation.   XII:  Normal tongue strength and range of motion, no deviation or fasciculation.  MOTOR:  Marked atrophy of EDB bilaterally.  No fasciculations or abnormal movements.  No pronator drift.  Tone is normal.    Right Upper Extremity:    Left Upper Extremity:    Deltoid  5/5   Deltoid  5/5   Biceps  5/5   Biceps  5/5   Triceps  5/5   Triceps  5/5   Wrist extensors  5/5   Wrist extensors  5/5   Wrist flexors  5/5   Wrist flexors  5/5   Finger extensors  5/5   Finger extensors  5/5   Finger flexors  5/5   Finger flexors  5/5   Dorsal interossei  4/5   Dorsal interossei  4/5   Abductor pollicis  5/5   Abductor pollicis  5/5   Tone (Ashworth scale)  0  Tone (Ashworth scale)  0   Right Lower Extremity:    Left Lower Extremity:    Hip flexors  5/5   Hip flexors  4+/5   Hip extensors  5/5   Hip  extensors  5/5   Adductor 5/5  Adductor 4+/5  Abduction 5/5  Abduction 5/5  Knee flexors  5/5   Knee flexors  4/5   Knee extensors  5/5   Knee extensors  5/5   Inversion 5/5  inversion 2/5  Eversion 5/5  Eversion 2/5  Dorsiflexors  5/5   Dorsiflexors  4/5   Plantarflexors  5/5   Plantarflexors  4/5   Toe extensors  5-/5   Toe extensors  1/5   Toe flexors  5-/5   Toe flexors  1/5   Tone (Ashworth scale)  0  Tone (Ashworth scale)  0   MSRs:  Right                                                                 Left brachioradialis 2+  brachioradialis 2+  biceps 2+  biceps 2+  triceps 2+  triceps 2+    patellar 2+  patellar 2+  ankle jerk 0  ankle jerk 0  Hoffman no  Hoffman no  plantar response down  plantar response down   SENSORY:  Pin prick is reduced over the lateral left leg and distally in feet.  There is a gradient loss of temperature below the knees..  Vibration 50% at the left knee and absent at the right ankle and great toe.  Proprioception is impair at the great toe bilaterally.  Romberg's sign present.   COORDINATION/GAIT: Normal finger-to- nose-finger.  Intact rapid alternating movements bilaterally.  Unable to rise from a chair without using arms.  Gait is antalgic favoring the right side and assisted with a cane.   IMPRESSION: 1.  Subacute sensorimotor polyneuropathy affecting the legs, worse on the left.  Clinically, she denies paresthesias or neuropathic pain, but is does appreciate large fiber sensory loss on exam.  EDX shows relatively severe neuropathy with fibrillation potential involving both legs, even though she is relatively asymptomatic in the right leg.  Because of the symmetric nature of her electrodiagnostic findings and the absence of neuropathic pain in the left foot/ankle, I do not feel that her left foot pain is stemming from a primary nerve pathology.  Her pain is characterized as dull and throbbing, she specifically denies burning, tingling, electrical, or sharp stabbing pain.  My work-up will be focused more towards understanding the nature of her neuropathy.  She has well-controlled diabetes, no history of alcohol, or family history of neuropathy.  Because she has hand weakness and to look for degree of involvement/polyradiculoneuropathy, she will have NCS/EMG of the arms.  Serology testing looking for secondary causes of neuropathy will also be checked.  2.  Chronic left foot pain, unclear etiology.  Does not have features of neuropathic pain or etiology.  Her foot weakness is certainly due neuropathy and possible worsened by immobility.  3.  Left hip flexion  weakness.  Possible due to left L4 radiculopathy.  There is some mild impingement of the left L4 nerve on her imaging from May 2017.  If she does not have improvement with PT, this will need to be readdressed.   PLAN/RECOMMENDATIONS:  1.  Check ESR, CRP, vitamin B12, ACE, folate, MMA, ANA, SSA/B, copper, vitamin B1, SPEP with IFE 2.  NCS/EMG of the arms 3.  Start physical therapy  for left leg strengthening  Return to clinic in 3 months.   The duration of this appointment visit was 70 minutes of face-to-face time with the patient.  Greater than 50% of this time was spent in counseling, explanation of diagnosis, planning of further management, and coordination of care.   Thank you for allowing me to participate in patient's care.  If I can answer any additional questions, I would be pleased to do so.    Sincerely,    Serra Younan K. Posey Pronto, DO

## 2017-01-27 LAB — COPPER, SERUM: Copper: 92 ug/dL (ref 70–175)

## 2017-01-28 LAB — ANA: Anti Nuclear Antibody(ANA): NEGATIVE

## 2017-01-28 LAB — SJOGREN'S SYNDROME ANTIBODS(SSA + SSB)
SSA (Ro) (ENA) Antibody, IgG: <1
SSB (La) (ENA) Antibody, IgG: <1

## 2017-01-28 LAB — ANGIOTENSIN CONVERTING ENZYME: Angiotensin-Converting Enzyme: <5 U/L — ABNORMAL LOW (ref 9–67)

## 2017-01-29 ENCOUNTER — Ambulatory Visit (INDEPENDENT_AMBULATORY_CARE_PROVIDER_SITE_OTHER): Payer: 59 | Admitting: Neurology

## 2017-01-29 DIAGNOSIS — G8314 Monoplegia of lower limb affecting left nondominant side: Secondary | ICD-10-CM | POA: Diagnosis not present

## 2017-01-29 DIAGNOSIS — E0842 Diabetes mellitus due to underlying condition with diabetic polyneuropathy: Secondary | ICD-10-CM

## 2017-01-29 DIAGNOSIS — M5417 Radiculopathy, lumbosacral region: Secondary | ICD-10-CM

## 2017-01-29 LAB — PROTEIN ELECTROPHORESIS, SERUM
Albumin ELP: 4.9 g/dL — ABNORMAL HIGH (ref 3.8–4.8)
Alpha-1-Globulin: 0.3 g/dL (ref 0.2–0.3)
Alpha-2-Globulin: 0.9 g/dL (ref 0.5–0.9)
Beta 2: 0.5 g/dL (ref 0.2–0.5)
Beta Globulin: 0.6 g/dL (ref 0.4–0.6)
Gamma Globulin: 0.9 g/dL (ref 0.8–1.7)
Total Protein, Serum Electrophoresis: 8.2 g/dL — ABNORMAL HIGH (ref 6.1–8.1)

## 2017-01-29 LAB — IMMUNOFIXATION ELECTROPHORESIS
IgA: 245 mg/dL (ref 81–463)
IgG (Immunoglobin G), Serum: 617 mg/dL — ABNORMAL LOW (ref 694–1618)
IgM, Serum: 60 mg/dL (ref 48–271)

## 2017-01-29 LAB — METHYLMALONIC ACID, SERUM: Methylmalonic Acid, Quant: 231 nmol/L (ref 87–318)

## 2017-01-29 NOTE — Procedures (Signed)
[facility]  [address]  [place], [place] [address] Tel: [phone] Fax:  [phone] Test Date:  01/29/2017  Patient: [name] DOB: 17-May-1966 Physician: Narda Amber, DO  Sex: Female Height: 5\' 6"  Ref Phys: Narda Amber, DO  ID#: [id number] Temp: 34.4C Technician: Jerilynn Mages. Dean   Patient Complaints: This is a 51 year old female with subacute polyneuropathy affecting the lower extremities now presenting with bilateral hand weakness and paresthesias.   NCV & EMG Findings: Extensive electrodiagnostic testing of the right upper extremity and additional studies of the left shows:  1. Bilateral median, ulnar, and radial sensory responses are absent. 2. Bilateral median motor responses show reduced amplitude (R2.3, L1.8 mV) and decreased conduction velocity (Elbow-Wrist, R38, L31 m/s).  Bilateral ulnar motor responses show prolonged latency (R3.6, 4.6 ms), reduced amplitude (R1.7, L1.3 mV), temporal dispersion, and conduction velocity slowing along the course of the nerve (29-45 m/s). 3. Bilateral ulnar F wave studies are absent. 4. There is severe active on chronic motor axon loss changes affecting the distal hand muscles including first dorsal interosseous, extensor indicis proprius, abductor digiti minimi, and abductor pollicis brevis muscles.  Motor unit configuration and recruitment pattern of more proximal muscles, including the cervical paraspinal muscles, is within normal limits.  Impression: The electrophysiologic testing is most consistent with an active on chronic, distal and symmetric sensorimotor polyneuropathy, demyelinating and axon loss in type. Overall, these findings are severe in degree electrically.   In addition, the presence of temporal dispersion suggests an acquired condition.   ___________________________ Narda Amber, DO    Nerve Conduction Studies Anti Sensory Summary Table   Site NR Peak (ms) Norm Peak (ms) P-T Amp (V) Norm  P-T Amp  Left Median Anti Sensory (2nd Digit)  34.4C  Wrist NR  <3.4  >20  Right Median Anti Sensory (2nd Digit)  34.4C  Wrist NR  <3.4  >20  Left Radial Anti Sensory (Base 1st Digit)  34.4C  Wrist NR  <2.7  >18  Right Radial Anti Sensory (Base 1st Digit)  34.4C  Wrist NR  <2.7  >18  Left Ulnar Anti Sensory (5th Digit)  34.4C  Wrist NR  <3.1  >12  Right Ulnar Anti Sensory (5th Digit)  34.4C  Wrist NR  <3.1  >12   Motor Summary Table   Site NR Onset (ms) Norm Onset (ms) O-P Amp (mV) Norm O-P Amp Site1 Site2 Delta-0 (ms) Dist (cm) Vel (m/s) Norm Vel (m/s)  Left Median Motor (Abd Poll Brev)  34.4C  Wrist    3.6 <3.9 1.8 >6 Elbow Wrist 8.9 28.0 31 >50  Elbow    12.5  1.4         Right Median Motor (Abd Poll Brev)  34.4C  Wrist    3.4 <3.9 2.3 >6 Elbow Wrist 7.4 28.0 38 >50  Elbow    10.8  1.9         Left Ulnar Motor (Abd Dig Minimi)  34.4C  Wrist    4.6 <3.1 1.3 >7 B Elbow Wrist 5.6 24.0 43 >50  B Elbow    10.2  1.1  A Elbow B Elbow 2.2 10.0 45 >50  A Elbow    12.4  1.0         Right Ulnar Motor (Abd Dig Minimi)  34.4C  Wrist    3.6 <3.1 1.7 >7 B Elbow Wrist 8.0 23.0 29 >50  B Elbow    11.6  1.0  A Elbow B Elbow 2.6  10.0 38 >50  A Elbow    14.2  1.0          F Wave Studies   NR F-Lat (ms) Lat Norm (ms) L-R F-Lat (ms)  Left Ulnar (Mrkrs) (Abd Dig Min)  34.4C  NR  <33   Right Ulnar (Mrkrs) (Abd Dig Min)  34.4C  NR  <33    EMG   Side Muscle Ins Act Fibs Psw Fasc Number Recrt Dur Dur. Amp Amp. Poly Poly. Comment  Left 1stDorInt Nml 3+ Nml Nml 2- Rapid Many 1+ Some 2+ Some 1+ N/A  Left Ext Indicis Nml 1+ Nml Nml 2- Rapid Some 1+ Some 1+ Some 1+ N/A  Left ABD Dig Min Nml 2+ Nml Nml SMU Rapid All 1+ All 1+ All 1+ ATR  Left Abd Poll Brev Nml 3+ Nml Nml SMU Rapid All 1+ All 1+ All 1+ ATR  Left PronatorTeres Nml Nml Nml Nml Nml Nml Nml Nml Nml Nml Nml Nml N/A  Left Biceps Nml Nml Nml Nml Nml Nml Nml Nml Nml Nml Nml Nml N/A  Left Triceps Nml Nml Nml Nml Nml Nml Nml Nml  Nml Nml Nml Nml N/A  Left Deltoid Nml Nml Nml Nml Nml Nml Nml Nml Nml Nml Nml Nml N/A  Left Cervical Parasp Low Nml Nml Nml Nml Nml Nml Nml Nml Nml Nml Nml Nml N/A  Right 1stDorInt Nml 3+ Nml Nml 3- Rapid All 1+ All 1+ All 1+ N/A  Right Ext Indicis Nml 1+ Nml Nml 2- Rapid Most 1+ Many 1+ Many 1+ N/A  Right PronatorTeres Nml Nml Nml Nml Nml Nml Nml Nml Nml Nml Nml Nml N/A  Right Biceps Nml Nml Nml Nml Nml Nml Nml Nml Nml Nml Nml Nml N/A  Right Triceps Nml Nml Nml Nml Nml Nml Nml Nml Nml Nml Nml Nml N/A  Right Deltoid Nml Nml Nml Nml Nml Nml Nml Nml Nml Nml Nml Nml N/A  Right BrachioRad Nml Nml Nml Nml Nml Nml Nml Nml Nml Nml Nml Nml N/A  Right ABD Dig Min Nml 3+ Nml Nml SMU Rapid All 1+ All 1+ All 1+ ATR  Right Abd Poll Brev Nml 2+ Nml Nml SMU Rapid Most 1+ Most 1+ Most 1+ ATR      Waveforms:

## 2017-01-30 ENCOUNTER — Ambulatory Visit: Payer: 59 | Attending: Neurology | Admitting: Physical Therapy

## 2017-01-30 DIAGNOSIS — R262 Difficulty in walking, not elsewhere classified: Secondary | ICD-10-CM | POA: Insufficient documentation

## 2017-01-30 DIAGNOSIS — M25572 Pain in left ankle and joints of left foot: Secondary | ICD-10-CM | POA: Diagnosis not present

## 2017-01-30 DIAGNOSIS — M6281 Muscle weakness (generalized): Secondary | ICD-10-CM | POA: Insufficient documentation

## 2017-01-30 DIAGNOSIS — M5417 Radiculopathy, lumbosacral region: Secondary | ICD-10-CM | POA: Diagnosis not present

## 2017-01-30 LAB — VITAMIN B1: Vitamin B1 (Thiamine): 6 nmol/L — ABNORMAL LOW (ref 8–30)

## 2017-01-30 NOTE — Therapy (Signed)
[place], [place], [address] Phone: [phone]   Fax:  [phone]  Physical Therapy Evaluation  Patient Details  Name: [name] MRN: [id number] Date of Birth: Jun 20, 1966 Referring Provider: Dr. Posey Pronto   Encounter Date: 01/30/2017      PT End of Session - 01/30/17 0849    Visit Number 1   Number of Visits 8   Date for PT Re-Evaluation 03/01/17   PT Start Time 0801   PT Stop Time 0846   PT Time Calculation (min) 45 min   Activity Tolerance Patient tolerated treatment well   Behavior During Therapy [facility] for tasks assessed/performed      Past Medical History:  Diagnosis Date  . Anginal pain ([place])    chest pain  heart cath done  . Cancer ([place])   . Chronic bronchitis ([place])    "get it q yr"  . Fibromyalgia   . GERD (gastroesophageal reflux disease)   . Hepatic adenoma   . High cholesterol   . History of gout   . History of hiatal hernia   . Hypertension   . Hypothyroidism   . Iron deficiency anemia    "comes and goes"  . Kidney stones   . Migraine    "@ least once/month" (02/18/2015)  . PONV (postoperative nausea and vomiting)   . Proteinuria   . Tachycardia   . Type II diabetes mellitus ([place])     Past Surgical History:  Procedure Laterality Date  . ABDOMINAL HYSTERECTOMY  ~2012   "lap"  . ANKLE RECONSTRUCTION Left 03/29/2016   Procedure: LEFT ANKLE LATERAL LIGAMENT RECONSTRUCTION AND PERONEAL TENDON REPAIR OR TENOLYSIS;  Surgeon: Wylene Simmer, MD;  Location: [place];  Service: Orthopedics;  Laterality: Left;  . BACK SURGERY    . CARDIAC CATHETERIZATION  04/2009   "clean"  . COLONOSCOPY    . CYSTOSCOPY WITH URETEROSCOPY, STONE BASKETRY AND STENT PLACEMENT  ~ 2006  . ESOPHAGOGASTRODUODENOSCOPY    . LAPAROSCOPIC CHOLECYSTECTOMY  05/2004  . LUMBAR DISC SURGERY  08/2011  . THYROIDECTOMY N/A 02/18/2015   Procedure: TOTAL THYROIDECTOMY;  Surgeon: [name], MD;  Location: [place];   Service: General;  Laterality: N/A;  . TOTAL THYROIDECTOMY  02/18/2015    There were no vitals filed for this visit.       Subjective Assessment - 01/30/17 0810    Subjective Patient presents as familiar to [place] for PT , referred to [place] by Neurologist after extensive EMG studies for investigation into L ankle weakness and foot pain.  She denies burning, sensory type pain.  She continues to be limited by L ankle pain and instability but also back and L hip pain.  Her back pain was severe in 2012 and was completely resolved after her surgery (microsdiscetomy) . Once she began to change her gait in 2016, she began to notice intermitent back pain with walking and standing long periods.    Pertinent History diabetes and neuropathy, COPD, obesity   Limitations Standing;Walking   How long can you sit comfortably? Sitting too long (30 min) increases back discomfort . 45 min to drive to P and she has difficulty standing and walking to the door.    How long can you stand comfortably? Footu hurts all the time.  Back hurts the longer she walks or stand 15 min    How long can you walk comfortably? 15 min    Diagnostic tests MRI 04/2016 in lumbar spine: New small broad-based  disc protrusion into the left lateral recess at L3-L4, L4-L5 stable . Her NCS/EMG showed acute on chronic sensorimotor polyneuropathy affecting both legs, worse on the right   Patient Stated Goals Would like to get stronger but patient unure if this can happen, wonders if weakness is from neuropathy or new LS spine issues.    Currently in Pain? No/denies   Pain Score 0-No pain   Pain Location Back   Pain Orientation Left;Lower   Pain Descriptors / Indicators Squeezing;Cramping;Tightness   Pain Type Chronic pain   Pain Radiating Towards L buttock    Pain Onset More than a month ago   Pain Frequency Intermittent   Aggravating Factors  walking, standing or sitting too long    Pain Relieving Factors min to no pain early in the AM, as the  day goes on pain increases    Pain Score --  not rated    Pain Location Ankle   Pain Orientation Left   Pain Descriptors / Indicators Aching   Pain Type Chronic pain   Pain Onset More than a month ago   Pain Frequency Constant   Aggravating Factors  walking    Pain Relieving Factors brace, rest    Effect of Pain on Daily Activities limtis mobility            [facility] PT Assessment - 01/30/17 0818      Assessment   Medical Diagnosis LS radiculopathy , monoplegia of L LE    Referring Provider Dr. Posey Pronto    Onset Date/Surgical Date --  2016 June 9was good after 2012 back surgery)    Next MD Visit Scheduled, unknown   Prior Therapy Yes for ankle     Precautions   Precautions None     Restrictions   Weight Bearing Restrictions No     Balance Screen   Has the patient fallen in the past 6 months Yes   How many times? 2  was dizzy, has heart monitor   Has the patient had a decrease in activity level because of a fear of falling?  Yes   Is the patient reluctant to leave their home because of a fear of falling?  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 because of mobility issues and pain      Cognition   Overall Cognitive Status Within Functional Limits for tasks assessed     Observation/Other Assessments   Focus on Therapeutic Outcomes (FOTO)  NT FOTO system down today      Observation/Other Assessments-Edema    Edema --  not measured in ankle      Sensation   Light Touch Impaired by gross assessment   Additional Comments feels deep pressure, min diminished  light touch but poor to absent proprioception in L >R foot      Posture/Postural Control   Posture/Postural Control Postural limitations   Postural Limitations Rounded Shoulders;Forward head;Decreased lumbar lordosis;Posterior pelvic tilt;Weight shift right   Posture Comments obese abdominals      AROM   Lumbar  Flexion 55  tight   Lumbar Extension 20  pain    Lumbar - Right Side Bend tight on L 50%   Lumbar - Left Side Bend pinch on L 50%   Lumbar - Right Rotation 50% pain   Lumbar - Left Rotation 50% pain      Strength   Right Hip Flexion 4+/5   Right Hip ABduction 4+/5   Left Hip Flexion 3/5   Left Hip ABduction 3-/5   Right Knee Flexion 5/5   Right Knee Extension 5/5   Left Knee Flexion 3/5   Left Knee Extension 3/5   Right Ankle Dorsiflexion 4/5   Left Ankle Dorsiflexion 3/5     Palpation   Palpation comment Painful lumbar spine to Left and into entire gluteal region.  Tightness throughout hips and lumbar paraspinals.      Ambulation/Gait   Assistive device Straight cane   Gait Pattern Step-to pattern;Antalgic             PT Education - 01/30/17 1925    Education provided Yes   Education Details PT/POC, basic back HEP and prognosis, weakness   Person(s) Educated Patient   Methods Explanation;Handout   Comprehension Verbalized understanding;Returned demonstration;Need further instruction;Verbal cues required;Tactile cues required          PT Short Term Goals - 01/30/17 2126      PT SHORT TERM GOAL #1   Title STG=LTG for lumbar spine            PT Long Term Goals - 01/30/17 2126      PT LONG TERM GOAL #1   Title Patient strength in LLE will increase by 1/2 muscle grade throughout to improve gait and safety.    Time 4   Period Weeks   Status New     PT LONG TERM GOAL #2   Title Pt will be able to sit for 45 min car ride and report only min, brief back discomfort upon standing.    Time 4   Period Weeks   Status New     PT LONG TERM GOAL #3    Title Pt will stand, walk for up to 15 min in community outing with pain minimally increased from baseline.    Time 4   Period Weeks   Status New     PT LONG TERM GOAL #4   Title Pt will be I with core strengthening program.    Time 4   Period Weeks   Status New     PT LONG TERM GOAL #5   Title Pt will be I with posture, lifting and body mechanics with simple ADLs to preent further increase in back pain .    Time 4   Period Weeks   Status New               Plan - 01/30/17 1926    Clinical Impression Statement Pt presenting for mod complexity eval of chronic low back pain.  She has significant weakness which seems to have worsened in her LLE since being seen for ankle in Sept. 2017.  EMG testing was significant for acute on chronic sensorimotor polyneuropathy affecting both legs, worse on the right, but without Rt. sided symptoms.  We will work towards strengthening her LLE and attempt to centralize pain which could be radicular to her foot.     Rehab Potential Good   Clinical Impairments Affecting Rehab Potential weightbearing on LLE limited by pain and ankle surgery  PT Frequency 2x / week   PT Duration 4 weeks   PT Treatment/Interventions ADLs/Self Care Home Management;Moist Heat;Ultrasound;Therapeutic exercise;Manual techniques;Electrical Stimulation;Balance training;Neuromuscular re-education;Patient/family education;Functional mobility training;Therapeutic activities;Dry needling;Taping;Gait training;Cryotherapy   PT Next Visit Plan trunk flexibility, core stab, modalities as needed, manual to L trunk in sidelying    PT Home Exercise Plan LTR, knee to chest and knee to opposite shoulder, encouraged cont hip abd and clam   Consulted and Agree with Plan of Care Patient      Patient will benefit from skilled therapeutic intervention in order to improve the following deficits and impairments:  Abnormal gait, Decreased mobility, Decreased strength, Increased edema, Impaired  sensation, Postural dysfunction, Improper body mechanics, Impaired flexibility, Hypomobility, Pain, Obesity, Difficulty walking, Increased fascial restricitons, Decreased range of motion  Visit Diagnosis: Radiculopathy, lumbosacral region  Muscle weakness (generalized)  Difficulty walking     Problem List Patient Active Problem List   Diagnosis Date Noted  . Cellulitis of toe of left foot 03/15/2016  . Toe ulcer ([place]) 03/15/2016  . Neoplasm of uncertain behavior of thyroid gland, right lobe 02/18/2015  . DIABETES MELLITUS, TYPE II 04/14/2009  . DIABETES MELLITUS, WITH RENAL COMPLICATIONS [id number]  . HYPERLIPIDEMIA 04/14/2009  . OBESITY 04/14/2009  . ANXIETY DEPRESSION 04/14/2009  . HYPERTENSION 04/14/2009  . COPD 04/14/2009  . CHEST PAIN 04/14/2009  . TACHYCARDIA, HX OF 04/14/2009    [name] 01/30/2017, 9:41 PM  [facility] [address] [place], [place], [address] Phone: [phone]   Fax:  [phone]  Name: [name] MRN: [id number] Date of Birth: Jun 14, 1966   Raeford Razor, PT 01/30/17 9:41 PM Phone: [phone] Fax: [phone]

## 2017-01-30 NOTE — Addendum Note (Signed)
Addended by: Clint Guy on: 01/30/2017 09:43 PM   Modules accepted: Orders

## 2017-01-30 NOTE — Patient Instructions (Signed)
   Piriformis Stretch    Lying on back, pull right knee toward opposite shoulder. Hold _20___ seconds. Repeat __3__ times. Do __2__ sessions per day.  http://gt2.exer.us/258   Copyright  VHI. All rights reserved.  Knee to Chest    Lying supine, bend involved knee to chest __3_ times. Repeat with other leg. Do __2_ times per day.  Copyright  VHI. All rights reserved.    Lower Trunk Rotation Stretch    Keeping back flat and feet together, rotate knees to left side. Hold ___20_ seconds. Repeat ___10_ times per set. Do __1-2__ sets per session. Do ___2_ sessions per day.  http://orth.exer.us/123   Copyright  VHI. All rights reserved.   Hip Flexion / Knee Extension: Straight-Leg Raise (Eccentric)    Lie on back. Lift leg with knee straight. Slowly lower leg for 3-5 seconds. _10__ reps per set, __1-2_ sets per day, __5-7_ days per week. Lower like elevator, stopping at each floor.   Copyright  VHI. All rights reserved.    KEEP DOING SIDE LEG LIFTS AND CLAM SHELL

## 2017-01-31 ENCOUNTER — Telehealth: Payer: Self-pay | Admitting: Neurology

## 2017-01-31 NOTE — Telephone Encounter (Signed)
Results of EMG and labs discussed with the patient. She has low-normal vitamin B12mg  daily and she will need B12 injections as follows:  vitamin B12 1056mcg IM injection daily x 7 days, weekly x 4 weeks, then monthly thereafter x 1 year.  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  Because of the severity of her active on chronic polyneuropathy affecting the hands and legs, the next step will be CSF testing to evaluate for inflammatory changes or infiltrative diseases.  Check CSF cell count and diff, protein, glucose, IgG index, oligoclonal bands, flow cytology, ACE, CSF lyme PCR.  Felton Buczynski K. Posey Pronto, DO

## 2017-02-04 ENCOUNTER — Ambulatory Visit: Payer: 59 | Admitting: Physical Therapy

## 2017-02-04 ENCOUNTER — Encounter: Payer: Self-pay | Admitting: Physical Therapy

## 2017-02-04 DIAGNOSIS — R262 Difficulty in walking, not elsewhere classified: Secondary | ICD-10-CM | POA: Diagnosis not present

## 2017-02-04 DIAGNOSIS — M25572 Pain in left ankle and joints of left foot: Secondary | ICD-10-CM | POA: Diagnosis not present

## 2017-02-04 DIAGNOSIS — M5417 Radiculopathy, lumbosacral region: Secondary | ICD-10-CM | POA: Diagnosis not present

## 2017-02-04 DIAGNOSIS — M6281 Muscle weakness (generalized): Secondary | ICD-10-CM

## 2017-02-04 DIAGNOSIS — M75112 Incomplete rotator cuff tear or rupture of left shoulder, not specified as traumatic: Secondary | ICD-10-CM | POA: Diagnosis not present

## 2017-02-04 DIAGNOSIS — M24112 Other articular cartilage disorders, left shoulder: Secondary | ICD-10-CM | POA: Diagnosis not present

## 2017-02-04 DIAGNOSIS — S46812A Strain of other muscles, fascia and tendons at shoulder and upper arm level, left arm, initial encounter: Secondary | ICD-10-CM | POA: Diagnosis not present

## 2017-02-04 DIAGNOSIS — F329 Major depressive disorder, single episode, unspecified: Secondary | ICD-10-CM | POA: Diagnosis not present

## 2017-02-04 NOTE — Therapy (Signed)
[place], [place], [address] Phone: [phone]   Fax:  [phone]  Physical Therapy Treatment  Patient Details  Name: [name] MRN: [id number] Date of Birth: Oct 11, 1966 Referring Provider: Dr. Posey Pronto   Encounter Date: 02/04/2017      PT End of Session - 02/04/17 0955    Visit Number 2   Number of Visits 8   Date for PT Re-Evaluation 03/01/17   PT Start Time 0931   PT Stop Time 1015   PT Time Calculation (min) 44 min   Activity Tolerance Patient tolerated treatment well   Behavior During Therapy [facility] for tasks assessed/performed      Past Medical History:  Diagnosis Date  . Anginal pain ([place])    chest pain  heart cath done  . Cancer ([place])   . Chronic bronchitis ([place])    "get it q yr"  . Fibromyalgia   . GERD (gastroesophageal reflux disease)   . Hepatic adenoma   . High cholesterol   . History of gout   . History of hiatal hernia   . Hypertension   . Hypothyroidism   . Iron deficiency anemia    "comes and goes"  . Kidney stones   . Migraine    "@ least once/month" (02/18/2015)  . PONV (postoperative nausea and vomiting)   . Proteinuria   . Tachycardia   . Type II diabetes mellitus ([place])     Past Surgical History:  Procedure Laterality Date  . ABDOMINAL HYSTERECTOMY  ~2012   "lap"  . ANKLE RECONSTRUCTION Left 03/29/2016   Procedure: LEFT ANKLE LATERAL LIGAMENT RECONSTRUCTION AND PERONEAL TENDON REPAIR OR TENOLYSIS;  Surgeon: Wylene Simmer, MD;  Location: [place];  Service: Orthopedics;  Laterality: Left;  . BACK SURGERY    . CARDIAC CATHETERIZATION  04/2009   "clean"  . COLONOSCOPY    . CYSTOSCOPY WITH URETEROSCOPY, STONE BASKETRY AND STENT PLACEMENT  ~ 2006  . ESOPHAGOGASTRODUODENOSCOPY    . LAPAROSCOPIC CHOLECYSTECTOMY  05/2004  . LUMBAR DISC SURGERY  08/2011  . THYROIDECTOMY N/A 02/18/2015   Procedure: TOTAL THYROIDECTOMY;  Surgeon: [name], MD;  Location: [place];   Service: General;  Laterality: N/A;  . TOTAL THYROIDECTOMY  02/18/2015    There were no vitals filed for this visit.      Subjective Assessment - 02/04/17 0942    Subjective Patient continues to feel likje her back is very tight. Her pain is coming up to the top of her buttocks. She has been working on her exercies.    Pertinent History diabetes and neuropathy, COPD, obesity   Limitations Standing;Walking   How long can you sit comfortably? Sitting too long (30 min) increases back discomfort . 45 min to drive to [place] and she has difficulty standing and walking to the door.    How long can you stand comfortably? Foot hurts all the time.  Back hurts the longer she walks or stand 15 min    Diagnostic tests MRI 04/2016 in lumbar spine: New small broad-based disc protrusion into the left lateral recess at L3-L4, L4-L5 stable . Her NCS/EMG showed acute on chronic sensorimotor polyneuropathy affecting both legs, worse on the right   Patient Stated Goals Would like to get stronger but patient unure if this can happen, wonders if weakness is from neuropathy or new LS spine issues.    Currently in Pain? Yes   Pain Location Back   Pain Orientation Left;Lower   Pain Descriptors /  Indicators Cramping;Squeezing   Pain Type Chronic pain   Pain Radiating Towards left buttock   Pain Onset More than a month ago   Pain Frequency Intermittent   Aggravating Factors  walking, standing or sitting too long.   Pain Relieving Factors increased pain as the day goes on    Effect of Pain on Daily Activities can not work    Multiple Pain Sites No                                 PT Education - 02/04/17 0954    Education provided Yes   Education Details symptom mangement,    Person(s) Educated Patient   Methods Explanation;Handout   Comprehension Verbalized understanding;Returned demonstration;Verbal cues required;Need further instruction          PT Short Term Goals - 01/30/17 2126       PT SHORT TERM GOAL #1   Title STG=LTG for lumbar spine            PT Long Term Goals - 01/30/17 2126      PT LONG TERM GOAL #1   Title Patient strength in LLE will increase by 1/2 muscle grade throughout to improve gait and safety.    Time 4   Period Weeks   Status New     PT LONG TERM GOAL #2   Title Pt will be able to sit for 45 min car ride and report only min, brief back discomfort upon standing.    Time 4   Period Weeks   Status New     PT LONG TERM GOAL #3   Title Pt will stand, walk for up to 15 min in community outing with pain minimally increased from baseline.    Time 4   Period Weeks   Status New     PT LONG TERM GOAL #4   Title Pt will be I with core strengthening program.    Time 4   Period Weeks   Status New     PT LONG TERM GOAL #5   Title Pt will be I with posture, lifting and body mechanics with simple ADLs to preent further increase in back pain .    Time 4   Period Weeks   Status New               Plan - 02/04/17 0957    Clinical Impression Statement Therapy focused on stretching and strengthening. She reqiured min/mod cuing for technique. She did not have increased pain with treatment. Therapy will continue to focus on strengthening. She did have some spasming with nmanual therapy.    Rehab Potential Good   Clinical Impairments Affecting Rehab Potential weightbearing on LLE limited by pain and ankle surgery   PT Frequency 2x / week   PT Duration 8 weeks   PT Treatment/Interventions ADLs/Self Care Home Management;Moist Heat;Ultrasound;Therapeutic exercise;Manual techniques;Electrical Stimulation;Balance training;Neuromuscular re-education;Patient/family education;Functional mobility training;Therapeutic activities;Dry needling;Taping;Gait training;Cryotherapy   PT Next Visit Plan trunk flexibility, core stab, modalities as needed, manual to L trunk in sidelying    PT Home Exercise Plan LTR, knee to chest and knee to opposite shoulder,  encouraged cont hip abd and clam   Consulted and Agree with Plan of Care Patient      Patient will benefit from skilled therapeutic intervention in order to improve the following deficits and impairments:  Abnormal gait, Decreased mobility, Decreased strength, Increased edema, Impaired sensation, Postural  dysfunction, Improper body mechanics, Impaired flexibility, Hypomobility, Pain, Obesity, Difficulty walking, Increased fascial restricitons, Decreased range of motion  Visit Diagnosis: Radiculopathy, lumbosacral region  Muscle weakness (generalized)  Difficulty walking  Pain in left ankle and joints of left foot     Problem List Patient Active Problem List   Diagnosis Date Noted  . Cellulitis of toe of left foot 03/15/2016  . Toe ulcer ([place]) 03/15/2016  . Neoplasm of uncertain behavior of thyroid gland, right lobe 02/18/2015  . DIABETES MELLITUS, TYPE II 04/14/2009  . DIABETES MELLITUS, WITH RENAL COMPLICATIONS [id number]  . HYPERLIPIDEMIA 04/14/2009  . OBESITY 04/14/2009  . ANXIETY DEPRESSION 04/14/2009  . HYPERTENSION 04/14/2009  . COPD 04/14/2009  . CHEST PAIN 04/14/2009  . TACHYCARDIA, HX OF 04/14/2009    Carney Living PT DPT  02/04/2017, 1:07 PM  [facility] [address] [place], [place], [address] Phone: [phone]   Fax:  [phone]  Name: [name] MRN: [id number] Date of Birth: 1966-03-13

## 2017-02-06 ENCOUNTER — Ambulatory Visit: Payer: Self-pay

## 2017-02-06 ENCOUNTER — Other Ambulatory Visit: Payer: Self-pay | Admitting: *Deleted

## 2017-02-06 ENCOUNTER — Telehealth: Payer: Self-pay | Admitting: Neurology

## 2017-02-06 DIAGNOSIS — G629 Polyneuropathy, unspecified: Secondary | ICD-10-CM

## 2017-02-06 NOTE — Telephone Encounter (Signed)
Patient has come questions about the LP. Please call [phone]

## 2017-02-06 NOTE — Telephone Encounter (Signed)
Called patient back and she just had some questions about [facility].

## 2017-02-06 NOTE — Telephone Encounter (Signed)
LP order placed and [name] will call patient with an appointment.  Patient is set up to come in for B12 injection training.

## 2017-02-07 ENCOUNTER — Ambulatory Visit: Payer: 59 | Attending: Neurology | Admitting: Physical Therapy

## 2017-02-07 ENCOUNTER — Ambulatory Visit (INDEPENDENT_AMBULATORY_CARE_PROVIDER_SITE_OTHER): Payer: 59 | Admitting: *Deleted

## 2017-02-07 DIAGNOSIS — M25672 Stiffness of left ankle, not elsewhere classified: Secondary | ICD-10-CM | POA: Insufficient documentation

## 2017-02-07 DIAGNOSIS — E538 Deficiency of other specified B group vitamins: Secondary | ICD-10-CM

## 2017-02-07 DIAGNOSIS — M6281 Muscle weakness (generalized): Secondary | ICD-10-CM | POA: Diagnosis not present

## 2017-02-07 DIAGNOSIS — M5417 Radiculopathy, lumbosacral region: Secondary | ICD-10-CM | POA: Insufficient documentation

## 2017-02-07 DIAGNOSIS — R262 Difficulty in walking, not elsewhere classified: Secondary | ICD-10-CM | POA: Diagnosis not present

## 2017-02-07 DIAGNOSIS — M25572 Pain in left ankle and joints of left foot: Secondary | ICD-10-CM | POA: Diagnosis not present

## 2017-02-07 MED ORDER — "SYRINGE 23G X 1"" 3 ML MISC": 1.0000 | Freq: Once | 0 refills | Status: AC

## 2017-02-07 MED ORDER — CYANOCOBALAMIN 1000 MCG/ML IJ SOLN: 1000.0000 ug | INTRAMUSCULAR | 0 refills | Status: DC

## 2017-02-07 MED ORDER — CYANOCOBALAMIN 1000 MCG/ML IJ SOLN
1000.0000 ug | Freq: Once | INTRAMUSCULAR | Status: AC
  Administered 2017-02-07: 1000 ug via INTRAMUSCULAR

## 2017-02-07 NOTE — Therapy (Signed)
[place], [place], [address] Phone: [phone]   Fax:  [phone]  Physical Therapy Treatment  Patient Details  Name: [name] MRN: [id number] Date of Birth: 11-16-1966 Referring Provider: Dr. Posey Pronto   Encounter Date: 02/07/2017      PT End of Session - 02/07/17 0904    Visit Number 3   Number of Visits 8   Date for PT Re-Evaluation 03/01/17   PT Start Time 0800   PT Stop Time 0845   PT Time Calculation (min) 45 min      Past Medical History:  Diagnosis Date  . Anginal pain ([place])    chest pain  heart cath done  . Cancer ([place])   . Chronic bronchitis ([place])    "get it q yr"  . Fibromyalgia   . GERD (gastroesophageal reflux disease)   . Hepatic adenoma   . High cholesterol   . History of gout   . History of hiatal hernia   . Hypertension   . Hypothyroidism   . Iron deficiency anemia    "comes and goes"  . Kidney stones   . Migraine    "@ least once/month" (02/18/2015)  . PONV (postoperative nausea and vomiting)   . Proteinuria   . Tachycardia   . Type II diabetes mellitus ([place])     Past Surgical History:  Procedure Laterality Date  . ABDOMINAL HYSTERECTOMY  ~2012   "lap"  . ANKLE RECONSTRUCTION Left 03/29/2016   Procedure: LEFT ANKLE LATERAL LIGAMENT RECONSTRUCTION AND PERONEAL TENDON REPAIR OR TENOLYSIS;  Surgeon: Wylene Simmer, MD;  Location: [facility];  Service: Orthopedics;  Laterality: Left;  . BACK SURGERY    . CARDIAC CATHETERIZATION  04/2009   "clean"  . COLONOSCOPY    . CYSTOSCOPY WITH URETEROSCOPY, STONE BASKETRY AND STENT PLACEMENT  ~ 2006  . ESOPHAGOGASTRODUODENOSCOPY    . LAPAROSCOPIC CHOLECYSTECTOMY  05/2004  . LUMBAR DISC SURGERY  08/2011  . THYROIDECTOMY N/A 02/18/2015   Procedure: TOTAL THYROIDECTOMY;  Surgeon: [name], MD;  Location: [facility];  Service: General;  Laterality: N/A;  . TOTAL THYROIDECTOMY  02/18/2015    There were no vitals filed for this  visit.      Subjective Assessment - 02/07/17 0818    Subjective I am doing my exercises. My doctores are trying tofigure out what is going on with me.    Currently in Pain? Yes   Pain Score 5    Pain Location Back   Pain Orientation Left;Lower   Pain Descriptors / Indicators Throbbing   Pain Radiating Towards left buttock                         [facility] Adult PT Treatment/Exercise - 02/07/17 0001      Exercises   Exercises Knee/Hip     Lumbar Exercises: Stretches   Lower Trunk Rotation 5 reps;10 seconds   Pelvic Tilt 10 seconds   Piriformis Stretch 2 reps;30 seconds   Piriformis Stretch Limitations 2 x each      Lumbar Exercises: Aerobic   Stationary Bike Nustep L 5 LE only x 10 minutes      Knee/Hip Exercises: Seated   Long Arc Quad Left;10 reps   Long Arc Quad Limitations red band      Knee/Hip Exercises: Supine   Bridges Limitations x 10    Bridges with Clamshell 10 reps   Straight Leg Raises 10 reps   Straight  Leg Raises Limitations with ab set   Straight Leg Raise with External Rotation 10 reps   Straight Leg Raise with External Rotation Limitations with ab set      Manual Therapy   Manual Therapy Soft tissue mobilization   Soft tissue mobilization left proximal gluteal and left lumbar paraspinals                   PT Short Term Goals - 01/30/17 2126      PT SHORT TERM GOAL #1   Title STG=LTG for lumbar spine            PT Long Term Goals - 01/30/17 2126      PT LONG TERM GOAL #1   Title Patient strength in LLE will increase by 1/2 muscle grade throughout to improve gait and safety.    Time 4   Period Weeks   Status New     PT LONG TERM GOAL #2   Title Pt will be able to sit for 45 min car ride and report only min, brief back discomfort upon standing.    Time 4   Period Weeks   Status New     PT LONG TERM GOAL #3   Title Pt will stand, walk for up to 15 min in community outing with pain minimally increased from  baseline.    Time 4   Period Weeks   Status New     PT LONG TERM GOAL #4   Title Pt will be I with core strengthening program.    Time 4   Period Weeks   Status New     PT LONG TERM GOAL #5   Title Pt will be I with posture, lifting and body mechanics with simple ADLs to preent further increase in back pain .    Time 4   Period Weeks   Status New               Plan - 02/07/17 [id number]    Clinical Impression Statement Reviewed HEP and added knee extension with red theraband for HEP. Asked pt to get ankle weights for HEP. Soft tissue work to left lower lumbar, will continue to address this area as well as LLE strengthening    PT Next Visit Plan trunk flexibility, core stab, modalities as needed, manual to L trunk in sidelying;    PT Home Exercise Plan LTR, knee to chest and knee to opposite shoulder, encouraged cont hip abd and clam; seated knee extension    Consulted and Agree with Plan of Care Patient      Patient will benefit from skilled therapeutic intervention in order to improve the following deficits and impairments:  Abnormal gait, Decreased mobility, Decreased strength, Increased edema, Impaired sensation, Postural dysfunction, Improper body mechanics, Impaired flexibility, Hypomobility, Pain, Obesity, Difficulty walking, Increased fascial restricitons, Decreased range of motion  Visit Diagnosis: Radiculopathy, lumbosacral region  Muscle weakness (generalized)  Difficulty walking     Problem List Patient Active Problem List   Diagnosis Date Noted  . Cellulitis of toe of left foot 03/15/2016  . Toe ulcer ([place]) 03/15/2016  . Neoplasm of uncertain behavior of thyroid gland, right lobe 02/18/2015  . DIABETES MELLITUS, TYPE II 04/14/2009  . DIABETES MELLITUS, WITH RENAL COMPLICATIONS [id number]  . HYPERLIPIDEMIA 04/14/2009  . OBESITY 04/14/2009  . ANXIETY DEPRESSION 04/14/2009  . HYPERTENSION 04/14/2009  . COPD 04/14/2009  . CHEST PAIN 04/14/2009  .  TACHYCARDIA, HX OF 04/14/2009    Hessie Diener  [place] , [place] 02/07/2017, [place], [place], [address] Phone: [phone]   Fax:  [phone]  Name: [name] MRN: [id number] Date of Birth: 1966/12/09

## 2017-02-13 ENCOUNTER — Ambulatory Visit: Payer: 59 | Admitting: Physical Therapy

## 2017-02-15 ENCOUNTER — Ambulatory Visit: Payer: 59 | Admitting: Physical Therapy

## 2017-02-15 DIAGNOSIS — R262 Difficulty in walking, not elsewhere classified: Secondary | ICD-10-CM

## 2017-02-15 DIAGNOSIS — M25672 Stiffness of left ankle, not elsewhere classified: Secondary | ICD-10-CM | POA: Diagnosis not present

## 2017-02-15 DIAGNOSIS — M25572 Pain in left ankle and joints of left foot: Secondary | ICD-10-CM

## 2017-02-15 DIAGNOSIS — M6281 Muscle weakness (generalized): Secondary | ICD-10-CM | POA: Diagnosis not present

## 2017-02-15 DIAGNOSIS — M5417 Radiculopathy, lumbosacral region: Secondary | ICD-10-CM

## 2017-02-15 NOTE — Therapy (Signed)
[place], [place], [address] Phone: [phone]   Fax:  [phone]  Physical Therapy Treatment  Patient Details  Name: [name] MRN: [id number] Date of Birth: 1966/04/23 Referring Provider: Dr. Posey Pronto   Encounter Date: 02/15/2017      PT End of Session - 02/15/17 0816    Visit Number 4   Number of Visits 8   Date for PT Re-Evaluation 03/01/17   PT Start Time 0800   PT Stop Time 0843   PT Time Calculation (min) 43 min   Activity Tolerance Patient tolerated treatment well   Behavior During Therapy [facility] for tasks assessed/performed      Past Medical History:  Diagnosis Date  . Anginal pain ([place])    chest pain  heart cath done  . Cancer ([place])   . Chronic bronchitis ([place])    "get it q yr"  . Fibromyalgia   . GERD (gastroesophageal reflux disease)   . Hepatic adenoma   . High cholesterol   . History of gout   . History of hiatal hernia   . Hypertension   . Hypothyroidism   . Iron deficiency anemia    "comes and goes"  . Kidney stones   . Migraine    "@ least once/month" (02/18/2015)  . PONV (postoperative nausea and vomiting)   . Proteinuria   . Tachycardia   . Type II diabetes mellitus ([place])     Past Surgical History:  Procedure Laterality Date  . ABDOMINAL HYSTERECTOMY  ~2012   "lap"  . ANKLE RECONSTRUCTION Left 03/29/2016   Procedure: LEFT ANKLE LATERAL LIGAMENT RECONSTRUCTION AND PERONEAL TENDON REPAIR OR TENOLYSIS;  Surgeon: Wylene Simmer, MD;  Location: [facility];  Service: Orthopedics;  Laterality: Left;  . BACK SURGERY    . CARDIAC CATHETERIZATION  04/2009   "clean"  . COLONOSCOPY    . CYSTOSCOPY WITH URETEROSCOPY, STONE BASKETRY AND STENT PLACEMENT  ~ 2006  . ESOPHAGOGASTRODUODENOSCOPY    . LAPAROSCOPIC CHOLECYSTECTOMY  05/2004  . LUMBAR DISC SURGERY  08/2011  . THYROIDECTOMY N/A 02/18/2015   Procedure: TOTAL THYROIDECTOMY;  Surgeon: [name], MD;  Location: [facility];   Service: General;  Laterality: N/A;  . TOTAL THYROIDECTOMY  02/18/2015    There were no vitals filed for this visit.      Subjective Assessment - 02/15/17 0812    Subjective Patient has been helping her husband all  week. She is feeling stiff and sore. She has not been sleeping much.    Pertinent History diabetes and neuropathy, COPD, obesity   Limitations Standing;Walking   How long can you sit comfortably? Sitting too long (30 min) increases back discomfort . 45 min to drive to [place] and she has difficulty standing and walking to the door.    How long can you stand comfortably? Foot hurts all the time.  Back hurts the longer she walks or stand 15 min    How long can you walk comfortably? 15 min    Diagnostic tests MRI 04/2016 in lumbar spine: New small broad-based disc protrusion into the left lateral recess at L3-L4, L4-L5 stable . Her NCS/EMG showed acute on chronic sensorimotor polyneuropathy affecting both legs, worse on the right   Patient Stated Goals Would like to get stronger but patient unure if this can happen, wonders if weakness is from neuropathy or new LS spine issues.    Currently in Pain? Yes   Pain Score 5    Pain Location  Back   Pain Orientation Left;Lower   Pain Descriptors / Indicators Throbbing   Pain Type Chronic pain   Pain Radiating Towards left buttcok    Pain Onset More than a month ago   Pain Frequency Intermittent   Aggravating Factors  walking, standing    Pain Relieving Factors rest    Effect of Pain on Daily Activities can not work    Multiple Pain Sites No                                 PT Education - 02/15/17 0816    Education provided Yes   Education Details sympto mangement    Person(s) Educated Patient   Methods Explanation;Handout   Comprehension Verbalized understanding;Returned demonstration;Verbal cues required;Need further instruction          PT Short Term Goals - 01/30/17 2126      PT SHORT TERM GOAL #1    Title STG=LTG for lumbar spine            PT Long Term Goals - 01/30/17 2126      PT LONG TERM GOAL #1   Title Patient strength in LLE will increase by 1/2 muscle grade throughout to improve gait and safety.    Time 4   Period Weeks   Status New     PT LONG TERM GOAL #2   Title Pt will be able to sit for 45 min car ride and report only min, brief back discomfort upon standing.    Time 4   Period Weeks   Status New     PT LONG TERM GOAL #3   Title Pt will stand, walk for up to 15 min in community outing with pain minimally increased from baseline.    Time 4   Period Weeks   Status New     PT LONG TERM GOAL #4   Title Pt will be I with core strengthening program.    Time 4   Period Weeks   Status New     PT LONG TERM GOAL #5   Title Pt will be I with posture, lifting and body mechanics with simple ADLs to preent further increase in back pain .    Time 4   Period Weeks   Status New               Plan - 02/15/17 9379    Clinical Impression Statement Despite being stiff the patient tolerated treatment well. She had no increase in pain. Sh econtinues to work on [place]. She is progressing slowly towards goals.    Rehab Potential Good   Clinical Impairments Affecting Rehab Potential weightbearing on LLE limited by pain and ankle surgery   PT Frequency 2x / week   PT Duration 8 weeks   PT Treatment/Interventions ADLs/Self Care Home Management;Moist Heat;Ultrasound;Therapeutic exercise;Manual techniques;Electrical Stimulation;Balance training;Neuromuscular re-education;Patient/family education;Functional mobility training;Therapeutic activities;Dry needling;Taping;Gait training;Cryotherapy   PT Next Visit Plan trunk flexibility, core stab, modalities as needed, manual to L trunk in sidelying;    PT Home Exercise Plan LTR, knee to chest and knee to opposite shoulder, encouraged cont hip abd and clam; seated knee extension    Consulted and Agree with Plan of Care  Patient      Patient will benefit from skilled therapeutic intervention in order to improve the following deficits and impairments:  Abnormal gait, Decreased mobility, Decreased strength, Increased edema, Impaired sensation, Postural dysfunction, Improper  body mechanics, Impaired flexibility, Hypomobility, Pain, Obesity, Difficulty walking, Increased fascial restricitons, Decreased range of motion  Visit Diagnosis: Radiculopathy, lumbosacral region  Muscle weakness (generalized)  Difficulty walking  Pain in left ankle and joints of left foot     Problem List Patient Active Problem List   Diagnosis Date Noted  . Cellulitis of toe of left foot 03/15/2016  . Toe ulcer ([place]) 03/15/2016  . Neoplasm of uncertain behavior of thyroid gland, right lobe 02/18/2015  . DIABETES MELLITUS, TYPE II 04/14/2009  . DIABETES MELLITUS, WITH RENAL COMPLICATIONS 47/42/5956  . HYPERLIPIDEMIA 04/14/2009  . OBESITY 04/14/2009  . ANXIETY DEPRESSION 04/14/2009  . HYPERTENSION 04/14/2009  . COPD 04/14/2009  . CHEST PAIN 04/14/2009  . TACHYCARDIA, HX OF 04/14/2009    Carney Living PT DPT  02/15/2017, 12:42 PM  [facility] [address] [place], [place], [address] Phone: [phone]   Fax:  [phone]  Name: [name] MRN: [id number] Date of Birth: November 04, 1966

## 2017-02-18 ENCOUNTER — Ambulatory Visit: Payer: 59 | Admitting: Physical Therapy

## 2017-02-18 DIAGNOSIS — F329 Major depressive disorder, single episode, unspecified: Secondary | ICD-10-CM | POA: Diagnosis not present

## 2017-02-18 DIAGNOSIS — M6281 Muscle weakness (generalized): Secondary | ICD-10-CM | POA: Diagnosis not present

## 2017-02-18 DIAGNOSIS — M25672 Stiffness of left ankle, not elsewhere classified: Secondary | ICD-10-CM | POA: Diagnosis not present

## 2017-02-18 DIAGNOSIS — M5417 Radiculopathy, lumbosacral region: Secondary | ICD-10-CM

## 2017-02-18 DIAGNOSIS — R262 Difficulty in walking, not elsewhere classified: Secondary | ICD-10-CM | POA: Diagnosis not present

## 2017-02-18 DIAGNOSIS — M25572 Pain in left ankle and joints of left foot: Secondary | ICD-10-CM | POA: Diagnosis not present

## 2017-02-18 NOTE — Therapy (Signed)
[place], [place], [address] Phone: [phone]   Fax:  [phone]  Physical Therapy Treatment  Patient Details  Name: [name] MRN: [id number] Date of Birth: Jan 25, 1966 Referring Provider: Dr. Posey Pronto   Encounter Date: 02/18/2017      PT End of Session - 02/18/17 1026    Visit Number 5   Number of Visits 8   Date for PT Re-Evaluation 03/01/17   PT Start Time 1016   PT Stop Time 1055   PT Time Calculation (min) 39 min      Past Medical History:  Diagnosis Date  . Anginal pain ([place])    chest pain  heart cath done  . Cancer ([place])   . Chronic bronchitis ([place])    "get it q yr"  . Fibromyalgia   . GERD (gastroesophageal reflux disease)   . Hepatic adenoma   . High cholesterol   . History of gout   . History of hiatal hernia   . Hypertension   . Hypothyroidism   . Iron deficiency anemia    "comes and goes"  . Kidney stones   . Migraine    "@ least once/month" (02/18/2015)  . PONV (postoperative nausea and vomiting)   . Proteinuria   . Tachycardia   . Type II diabetes mellitus ([place])     Past Surgical History:  Procedure Laterality Date  . ABDOMINAL HYSTERECTOMY  ~2012   "lap"  . ANKLE RECONSTRUCTION Left 03/29/2016   Procedure: LEFT ANKLE LATERAL LIGAMENT RECONSTRUCTION AND PERONEAL TENDON REPAIR OR TENOLYSIS;  Surgeon: Wylene Simmer, MD;  Location: [facility];  Service: Orthopedics;  Laterality: Left;  . BACK SURGERY    . CARDIAC CATHETERIZATION  04/2009   "clean"  . COLONOSCOPY    . CYSTOSCOPY WITH URETEROSCOPY, STONE BASKETRY AND STENT PLACEMENT  ~ 2006  . ESOPHAGOGASTRODUODENOSCOPY    . LAPAROSCOPIC CHOLECYSTECTOMY  05/2004  . LUMBAR DISC SURGERY  08/2011  . THYROIDECTOMY N/A 02/18/2015   Procedure: TOTAL THYROIDECTOMY;  Surgeon: [name], MD;  Location: [facility];  Service: General;  Laterality: N/A;  . TOTAL THYROIDECTOMY  02/18/2015    There were no vitals filed for this  visit.      Subjective Assessment - 02/18/17 1018    Subjective i am tired. My husband had surgery this week. I was sore after last visit.    Currently in Pain? Yes   Pain Score 5    Pain Location Back   Pain Orientation Left;Lower   Pain Descriptors / Indicators --  pulling                          [facility] Adult PT Treatment/Exercise - 02/18/17 0001      Lumbar Exercises: Stretches   Pelvic Tilt 10 seconds     Lumbar Exercises: Aerobic   Stationary Bike Nustep L 5 LE only x 8 minutes      Knee/Hip Exercises: Seated   Long Arc Quad Left;20 reps   Long Arc Quad Weight 4 lbs.   Marching Limitations let hip flexion x 10       Knee/Hip Exercises: Supine   Bridges Limitations x 10                            Other Supine Knee/Hip Exercises ball squeeze x 10      Knee/Hip Exercises: Sidelying   Other Sidelying  Knee/Hip Exercises QL stretch in sidelying over roled pillow      Manual Therapy   Manual Therapy Soft tissue mobilization;Passive ROM   Soft tissue mobilization left proximal gluteal and left lumbar paraspinals /QL    Passive ROM left and right knee to chest , hamstring stretch in attempt to relieve cramps                   PT Short Term Goals - 01/30/17 2126      PT SHORT TERM GOAL #1   Title STG=LTG for lumbar spine            PT Long Term Goals - 01/30/17 2126      PT LONG TERM GOAL #1   Title Patient strength in LLE will increase by 1/2 muscle grade throughout to improve gait and safety.    Time 4   Period Weeks   Status New     PT LONG TERM GOAL #2   Title Pt will be able to sit for 45 min car ride and report only min, brief back discomfort upon standing.    Time 4   Period Weeks   Status New     PT LONG TERM GOAL #3   Title Pt will stand, walk for up to 15 min in community outing with pain minimally increased from baseline.    Time 4   Period Weeks   Status New     PT LONG TERM GOAL #4   Title Pt will be I  with core strengthening program.    Time 4   Period Weeks   Status New     PT LONG TERM GOAL #5   Title Pt will be I with posture, lifting and body mechanics with simple ADLs to preent further increase in back pain .    Time 4   Period Weeks   Status New               Plan - 02/18/17 1129    Clinical Impression Statement Pt reports spasming anc cramping along left lower back. Pt unable to perform therex without c/o spasms in lumbar. Passive stretching relieved spasms somewhat however she c/o increased left hip pain with knee to chest. Performed side lying QL stretch and used IASTM to left paraspinals and QL. Afterward, pt reports decreased tightness and pain in the area.    PT Next Visit Plan trunk flexibility, core stab, modalities as needed, manual to L trunk in sidelying;    PT Home Exercise Plan LTR, knee to chest and knee to opposite shoulder, encouraged cont hip abd and clam; seated knee extension ; side lying QL stretch    Consulted and Agree with Plan of Care Patient      Patient will benefit from skilled therapeutic intervention in order to improve the following deficits and impairments:  Abnormal gait, Decreased mobility, Decreased strength, Increased edema, Impaired sensation, Postural dysfunction, Improper body mechanics, Impaired flexibility, Hypomobility, Pain, Obesity, Difficulty walking, Increased fascial restricitons, Decreased range of motion  Visit Diagnosis: Radiculopathy, lumbosacral region  Muscle weakness (generalized)     Problem List Patient Active Problem List   Diagnosis Date Noted  . Cellulitis of toe of left foot 03/15/2016  . Toe ulcer ([place]) 03/15/2016  . Neoplasm of uncertain behavior of thyroid gland, right lobe 02/18/2015  . DIABETES MELLITUS, TYPE II 04/14/2009  . DIABETES MELLITUS, WITH RENAL COMPLICATIONS 60/09/9322  . HYPERLIPIDEMIA 04/14/2009  . OBESITY 04/14/2009  . ANXIETY DEPRESSION 04/14/2009  .  HYPERTENSION 04/14/2009  .  COPD 04/14/2009  . CHEST PAIN 04/14/2009  . TACHYCARDIA, HX OF 04/14/2009    Dorene Ar , PTA 02/18/2017, 11:33 AM  [facility] [address] [place], [place], [address] Phone: [phone]   Fax:  [phone]  Name: EVAN OSBURN MRN: [id number] Date of Birth: 24-Dec-1965

## 2017-02-19 ENCOUNTER — Ambulatory Visit
Admission: RE | Admit: 2017-02-19 | Discharge: 2017-02-19 | Disposition: A | Discharge status: Home / Self Care | Payer: 59 | Source: Ambulatory Visit | Attending: Neurology | Admitting: Neurology

## 2017-02-19 ENCOUNTER — Other Ambulatory Visit (HOSPITAL_COMMUNITY)
Admission: RE | Admit: 2017-02-19 | Discharge: 2017-02-19 | Disposition: A | Discharge status: Home / Self Care | Payer: 59 | Source: Ambulatory Visit | Attending: Neurology | Admitting: Neurology

## 2017-02-19 DIAGNOSIS — G629 Polyneuropathy, unspecified: Secondary | ICD-10-CM | POA: Diagnosis not present

## 2017-02-19 DIAGNOSIS — R51 Headache: Secondary | ICD-10-CM | POA: Diagnosis not present

## 2017-02-19 LAB — PROTEIN, CSF: Total Protein, CSF: 37 mg/dL (ref 15–45)

## 2017-02-19 LAB — CSF CELL COUNT WITH DIFFERENTIAL
RBC Count, CSF: 1200 cells/uL — ABNORMAL HIGH (ref 0–10)
WBC, CSF: 0 cells/uL (ref 0–5)

## 2017-02-19 LAB — GLUCOSE, CSF: Glucose, CSF: 76 mg/dL (ref 43–76)

## 2017-02-19 NOTE — Progress Notes (Signed)
1 SST tube drawn from right AC. Site is unremarkable and pt tolerated procedure well.

## 2017-02-19 NOTE — Discharge Instructions (Signed)

## 2017-02-20 ENCOUNTER — Telehealth: Payer: Self-pay | Admitting: Neurology

## 2017-02-20 NOTE — Telephone Encounter (Signed)
Patient needs to talk to someone about the headache and bruise she is she having from LP please call [phone]

## 2017-02-21 ENCOUNTER — Encounter: Payer: Self-pay | Admitting: Physical Therapy

## 2017-02-21 ENCOUNTER — Ambulatory Visit: Payer: 59 | Admitting: Physical Therapy

## 2017-02-21 ENCOUNTER — Ambulatory Visit (INDEPENDENT_AMBULATORY_CARE_PROVIDER_SITE_OTHER): Payer: 59 | Admitting: Podiatry

## 2017-02-21 ENCOUNTER — Encounter: Payer: Self-pay | Admitting: Podiatry

## 2017-02-21 DIAGNOSIS — M25672 Stiffness of left ankle, not elsewhere classified: Secondary | ICD-10-CM | POA: Diagnosis not present

## 2017-02-21 DIAGNOSIS — R262 Difficulty in walking, not elsewhere classified: Secondary | ICD-10-CM

## 2017-02-21 DIAGNOSIS — E1140 Type 2 diabetes mellitus with diabetic neuropathy, unspecified: Secondary | ICD-10-CM

## 2017-02-21 DIAGNOSIS — E1149 Type 2 diabetes mellitus with other diabetic neurological complication: Secondary | ICD-10-CM

## 2017-02-21 DIAGNOSIS — M6281 Muscle weakness (generalized): Secondary | ICD-10-CM | POA: Diagnosis not present

## 2017-02-21 DIAGNOSIS — M25572 Pain in left ankle and joints of left foot: Secondary | ICD-10-CM

## 2017-02-21 DIAGNOSIS — L97521 Non-pressure chronic ulcer of other part of left foot limited to breakdown of skin: Secondary | ICD-10-CM | POA: Diagnosis not present

## 2017-02-21 DIAGNOSIS — M5417 Radiculopathy, lumbosacral region: Secondary | ICD-10-CM | POA: Diagnosis not present

## 2017-02-21 LAB — ANGIOTENSIN CONVERTING ENZYME, CSF: ACE, CSF: 10 U/L (ref <=15)

## 2017-02-21 LAB — BORRELIA SPECIES DNA, FLUID, PCR: B. burgdorferi DNA: NOT DETECTED

## 2017-02-21 NOTE — Therapy (Signed)
[place], [place], [address] Phone: [phone]   Fax:  [phone]  Physical Therapy Treatment  Patient Details  Name: [name] MRN: [id number] Date of Birth: [birth year] Referring Provider: Dr. Posey Pronto   Encounter Date: 02/21/2017      PT End of Session - 02/21/17 1001    Visit Number 6   Number of Visits 8   Date for PT Re-Evaluation 03/01/17   PT Start Time 0931   PT Stop Time 1015   PT Time Calculation (min) 44 min   Activity Tolerance Patient tolerated treatment well   Behavior During Therapy [facility] for tasks assessed/performed      Past Medical History:  Diagnosis Date  . Anginal pain ([place])    chest pain  heart cath done  . Cancer ([place])   . Chronic bronchitis ([place])    "get it q yr"  . Fibromyalgia   . GERD (gastroesophageal reflux disease)   . Hepatic adenoma   . High cholesterol   . History of gout   . History of hiatal hernia   . Hypertension   . Hypothyroidism   . Iron deficiency anemia    "comes and goes"  . Kidney stones   . Migraine    "@ least once/month" (02/18/2015)  . PONV (postoperative nausea and vomiting)   . Proteinuria   . Tachycardia   . Type II diabetes mellitus ([place])     Past Surgical History:  Procedure Laterality Date  . ABDOMINAL HYSTERECTOMY  ~2012   "lap"  . ANKLE RECONSTRUCTION Left 03/29/2016   Procedure: LEFT ANKLE LATERAL LIGAMENT RECONSTRUCTION AND PERONEAL TENDON REPAIR OR TENOLYSIS;  Surgeon: Wylene Simmer, MD;  Location: [place];  Service: Orthopedics;  Laterality: Left;  . BACK SURGERY    . CARDIAC CATHETERIZATION  04/2009   "clean"  . COLONOSCOPY    . CYSTOSCOPY WITH URETEROSCOPY, STONE BASKETRY AND STENT PLACEMENT  ~ 2006  . ESOPHAGOGASTRODUODENOSCOPY    . LAPAROSCOPIC CHOLECYSTECTOMY  05/2004  . LUMBAR DISC SURGERY  08/2011  . THYROIDECTOMY N/A 02/18/2015   Procedure: TOTAL THYROIDECTOMY;  Surgeon: [name], MD;  Location: [place];   Service: General;  Laterality: N/A;  . TOTAL THYROIDECTOMY  02/18/2015    There were no vitals filed for this visit.      Subjective Assessment - 02/21/17 0957    Subjective Patient reports her back was sore yesterday. It was a little better today but still sore. She has been working on her exercises.    Pertinent History diabetes and neuropathy, COPD, obesity   Limitations Standing;Walking   How long can you sit comfortably? Sitting too long (30 min) increases back discomfort . 45 min to drive to P and she has difficulty standing and walking to the door.    How long can you stand comfortably? Foot hurts all the time.  Back hurts the longer she walks or stand 15 min    How long can you walk comfortably? 15 min    Diagnostic tests MRI 04/2016 in lumbar spine: New small broad-based disc protrusion into the left lateral recess at L3-L4, L4-L5 stable . Her NCS/EMG showed acute on chronic sensorimotor polyneuropathy affecting both legs, worse on the right   Patient Stated Goals Would like to get stronger but patient unure if this can happen, wonders if weakness is from neuropathy or new LS spine issues.    Currently in Pain? Yes   Pain Score 5  Pain Location Back   Pain Orientation Left;Lower   Pain Radiating Towards left buttock    Pain Onset More than a month ago   Pain Frequency Intermittent   Aggravating Factors  walkng, standing    Pain Relieving Factors rest    Effect of Pain on Daily Activities can not work    Multiple Pain Sites No                         [facility] Adult PT Treatment/Exercise - 02/21/17 0001      Lumbar Exercises: Stretches   Pelvic Tilt 10 seconds     Lumbar Exercises: Aerobic   Stationary Bike Nustep L 5 LE only x 8 minutes      Knee/Hip Exercises: Seated   Long Arc Quad Left;20 reps   Long Arc Quad Weight 4 lbs.   Marching Limitations let hip flexion x 10       Knee/Hip Exercises: Supine   Bridges Limitations x 10    Bridges with [employer] 10 reps   Straight Leg Raises 10 reps   Straight Leg Raises Limitations with ab set   Straight Leg Raise with External Rotation 10 reps   Straight Leg Raise with External Rotation Limitations with ab set    Other Supine Knee/Hip Exercises ball squeeze x 10      Knee/Hip Exercises: Sidelying   Other Sidelying Knee/Hip Exercises QL stretch in sidelying over roled pillow 1 min      Manual Therapy   Manual Therapy Soft tissue mobilization;Passive ROM   Soft tissue mobilization left proximal gluteal and left lumbar paraspinals /QL    Passive ROM left and right knee to chest , hamstring stretch in attempt to relieve cramps                 PT Education - 02/21/17 1000    Education provided Yes   Person(s) Educated Patient   Methods Explanation;Handout   Comprehension Verbalized understanding;Returned demonstration;Verbal cues required;Need further instruction          PT Short Term Goals - 01/30/17 2126      PT SHORT TERM GOAL #1   Title STG=LTG for lumbar spine            PT Long Term Goals - 01/30/17 2126      PT LONG TERM GOAL #1   Title Patient strength in LLE will increase by 1/2 muscle grade throughout to improve gait and safety.    Time 4   Period Weeks   Status New     PT LONG TERM GOAL #2   Title Pt will be able to sit for 45 min car ride and report only min, brief back discomfort upon standing.    Time 4   Period Weeks   Status New     PT LONG TERM GOAL #3   Title Pt will stand, walk for up to 15 min in community outing with pain minimally increased from baseline.    Time 4   Period Weeks   Status New     PT LONG TERM GOAL #4   Title Pt will be I with core strengthening program.    Time 4   Period Weeks   Status New     PT LONG TERM GOAL #5   Title Pt will be I with posture, lifting and body mechanics with simple ADLs to preent further increase in back pain .    Time  4   Period Weeks   Status New               Plan -  02/21/17 1005    Clinical Impression Statement Patient given handout of her exercises. She had no significant increase in pain with treatment. She continues to work hard on her exercise sbut can not tell if there is a major difference.    Rehab Potential Good   PT Frequency 2x / week   PT Duration 8 weeks      Patient will benefit from skilled therapeutic intervention in order to improve the following deficits and impairments:  Abnormal gait, Decreased mobility, Decreased strength, Increased edema, Impaired sensation, Postural dysfunction, Improper body mechanics, Impaired flexibility, Hypomobility, Pain, Obesity, Difficulty walking, Increased fascial restricitons, Decreased range of motion  Visit Diagnosis: Radiculopathy, lumbosacral region  Muscle weakness (generalized)  Difficulty walking  Pain in left ankle and joints of left foot  Difficulty in walking, not elsewhere classified  Stiffness of left ankle, not elsewhere classified     Problem List Patient Active Problem List   Diagnosis Date Noted  . Cellulitis of toe of left foot 03/15/2016  . Toe ulcer ([place]) 03/15/2016  . Neoplasm of uncertain behavior of thyroid gland, right lobe 02/18/2015  . DIABETES MELLITUS, TYPE II 04/14/2009  . DIABETES MELLITUS, WITH RENAL COMPLICATIONS 57/12/7791  . HYPERLIPIDEMIA 04/14/2009  . OBESITY 04/14/2009  . ANXIETY DEPRESSION 04/14/2009  . HYPERTENSION 04/14/2009  . COPD 04/14/2009  . CHEST PAIN 04/14/2009  . TACHYCARDIA, HX OF 04/14/2009    Carney Living PT DPT  02/21/2017, 1:01 PM  [facility] [address] [place], [place], [address] Phone: [phone]   Fax:  [phone]  Name: [name] MRN: [id number] Date of Birth: 02-20-1966

## 2017-02-21 NOTE — Telephone Encounter (Signed)
I spoke with patient yesterday and she said that she has taken tylenol for the headache.  She says that she feels better when lying down.  Instructed her to continue to rest and call tomorrow if no better.  Patient agreed with plan.

## 2017-02-21 NOTE — Progress Notes (Signed)
Patient ID: TAILEY TOP, female   DOB: 1966/05/23, 51 y.o.   MRN: [id number]  Subjective: 51 year old female presents today for follow-up of left hallux pre-ulcerative callus. She has noticed that the skin did crack and the wound is turned to come back but she denies any redness, drainage or any swelling or pus.  Since last appointment she is also been following with neurology for significant neuropathy She is still undergoing testing for this. She is also going to rehab.   Denies any systemic complaints such as fevers, chills, nausea, vomiting. No acute changes since last appointment, and no other complaints at this time.   Objective: AAO x3, NAD DP/PT pulses palpable bilaterally, CRT less than 3 seconds On the plantar aspect of the hallux is a hyperkeratotic lesion with a central fissure. Upon debridement there is a very superficial abrasion type lesion to the area. There is no drainage or pus. There is no edema, erythema. There is no probing, undermining or tunneling. No pain with calf compression, swelling, warmth, erythema  Assessment: Superficial abrasion wound left hallux without signs of infection  Plan: -All treatment options discussed with the patient including all alternatives, risks, complications.  -Hyperkerotic lesion was debrided without complications or bleeding. Continue offloading. Continue and about ointment dressing changes daily. -Continue with neurology follow-up. -Monitor for any clinical signs or symptoms of infection and directed to call the office immediately should any occur or go to the ER. - Follow-up as scheduled or sooner if needed.   Celesta Gentile, DPM

## 2017-02-22 ENCOUNTER — Ambulatory Visit
Admission: RE | Admit: 2017-02-22 | Discharge: 2017-02-22 | Disposition: A | Discharge status: Home / Self Care | Payer: 59 | Source: Ambulatory Visit | Attending: Neurology | Admitting: Neurology

## 2017-02-22 ENCOUNTER — Telehealth: Payer: Self-pay | Admitting: Neurology

## 2017-02-22 ENCOUNTER — Other Ambulatory Visit: Payer: Self-pay | Admitting: *Deleted

## 2017-02-22 DIAGNOSIS — G4489 Other headache syndrome: Secondary | ICD-10-CM

## 2017-02-22 DIAGNOSIS — G971 Other reaction to spinal and lumbar puncture: Secondary | ICD-10-CM | POA: Diagnosis not present

## 2017-02-22 MED ORDER — IOPAMIDOL (ISOVUE-M 200) INJECTION 41%
1.0000 mL | Freq: Once | INTRAMUSCULAR | Status: AC
  Administered 2017-02-22: 1 mL via EPIDURAL

## 2017-02-22 NOTE — Discharge Instructions (Signed)

## 2017-02-22 NOTE — Telephone Encounter (Signed)
Pt needs to talk with someone about the headache she still has from the LP she had on Tuesday.  [phone]

## 2017-02-22 NOTE — Progress Notes (Signed)
20cc blood drawn from right [facility] space without difficulty; site unremarkable.  Brita Romp, RN

## 2017-02-22 NOTE — Telephone Encounter (Signed)
Order for blood patch placed.  [name] will call patient to schedule for today.

## 2017-02-23 LAB — CNS IGG SYNTHESIS RATE, CSF+BLOOD
Albumin, CSF: 13 mg/dL (ref 8.0–42.0)
Albumin, Serum(Neph): 4.1 g/dL (ref 3.5–4.9)
IgG Index, CSF: 0.47 (ref <0.66)
IgG, CSF: 1 mg/dL (ref 0.8–7.7)
IgG, Serum: 666 mg/dL — ABNORMAL LOW (ref 694–1618)
MS CNS IgG Synthesis Rate: -2.3 mg/24 h (ref <=3.3)

## 2017-02-25 ENCOUNTER — Ambulatory Visit: Payer: 59 | Admitting: Physical Therapy

## 2017-02-25 LAB — OLIGOCLONAL BANDS, CSF + SERM

## 2017-02-26 ENCOUNTER — Telehealth: Payer: Self-pay | Admitting: *Deleted

## 2017-02-26 ENCOUNTER — Encounter: Payer: Self-pay | Admitting: *Deleted

## 2017-02-26 NOTE — Telephone Encounter (Signed)
-----   Message from Alda Berthold, DO sent at 02/25/2017  7:47 AM EDT ----- Please see how patient's headache is doing and notify her that her CSF labs look great and there are no signs of inflammation affecting her nerves.  We will discuss the next step at her f/u visit (she is welcome to scheduled sooner than May, if she would like).

## 2017-02-26 NOTE — Telephone Encounter (Signed)
Results sent via My Chart.  Instructed patient to call if she would like a sooner appointment.

## 2017-02-28 ENCOUNTER — Ambulatory Visit (INDEPENDENT_AMBULATORY_CARE_PROVIDER_SITE_OTHER): Payer: 59 | Admitting: Neurology

## 2017-02-28 ENCOUNTER — Ambulatory Visit (HOSPITAL_COMMUNITY)
Admission: RE | Admit: 2017-02-28 | Discharge: 2017-02-28 | Disposition: A | Discharge status: Home / Self Care | Payer: 59 | Source: Ambulatory Visit | Attending: Neurology | Admitting: Neurology

## 2017-02-28 ENCOUNTER — Telehealth: Payer: Self-pay | Admitting: Neurology

## 2017-02-28 ENCOUNTER — Ambulatory Visit: Payer: 59 | Admitting: Physical Therapy

## 2017-02-28 ENCOUNTER — Other Ambulatory Visit: Payer: Self-pay

## 2017-02-28 ENCOUNTER — Encounter: Payer: Self-pay | Admitting: Physical Therapy

## 2017-02-28 ENCOUNTER — Encounter: Payer: Self-pay | Admitting: Neurology

## 2017-02-28 ENCOUNTER — Telehealth: Payer: Self-pay

## 2017-02-28 VITALS — BP 108/62 | HR 92 | Ht 66.0 in | Wt 280.0 lb

## 2017-02-28 DIAGNOSIS — M5417 Radiculopathy, lumbosacral region: Secondary | ICD-10-CM

## 2017-02-28 DIAGNOSIS — M5126 Other intervertebral disc displacement, lumbar region: Secondary | ICD-10-CM | POA: Diagnosis not present

## 2017-02-28 DIAGNOSIS — M5442 Lumbago with sciatica, left side: Secondary | ICD-10-CM | POA: Diagnosis not present

## 2017-02-28 DIAGNOSIS — R29898 Other symptoms and signs involving the musculoskeletal system: Secondary | ICD-10-CM

## 2017-02-28 DIAGNOSIS — M25572 Pain in left ankle and joints of left foot: Secondary | ICD-10-CM

## 2017-02-28 DIAGNOSIS — M6281 Muscle weakness (generalized): Secondary | ICD-10-CM

## 2017-02-28 DIAGNOSIS — G959 Disease of spinal cord, unspecified: Secondary | ICD-10-CM

## 2017-02-28 DIAGNOSIS — M48061 Spinal stenosis, lumbar region without neurogenic claudication: Secondary | ICD-10-CM | POA: Insufficient documentation

## 2017-02-28 DIAGNOSIS — M5441 Lumbago with sciatica, right side: Secondary | ICD-10-CM

## 2017-02-28 DIAGNOSIS — R262 Difficulty in walking, not elsewhere classified: Secondary | ICD-10-CM | POA: Diagnosis not present

## 2017-02-28 DIAGNOSIS — M25672 Stiffness of left ankle, not elsewhere classified: Secondary | ICD-10-CM | POA: Diagnosis not present

## 2017-02-28 DIAGNOSIS — M5106 Intervertebral disc disorders with myelopathy, lumbar region: Secondary | ICD-10-CM | POA: Diagnosis not present

## 2017-02-28 LAB — POCT I-STAT CREATININE: Creatinine, Ser: 1 mg/dL (ref 0.44–1.00)

## 2017-02-28 MED ORDER — GADOBENATE DIMEGLUMINE 529 MG/ML IV SOLN
20.0000 mL | Freq: Once | INTRAVENOUS | Status: AC | PRN
  Administered 2017-02-28: 20 mL via INTRAVENOUS

## 2017-02-28 MED ORDER — GABAPENTIN 300 MG PO CAPS: 300.0000 mg | ORAL_CAPSULE | Freq: Three times a day (TID) | ORAL | 3 refills | Status: DC

## 2017-02-28 NOTE — Patient Instructions (Addendum)
1.  Rest, ice low back 2.  Start flexeril 5mg  in the morning/afternoon and continue 10mg  at bedtime 3.  Increase gabapentin to 300mg  twice daily (AM and PM), if pain persists, you can take an extra dose  4.  CT lumbar spine wo contrast

## 2017-02-28 NOTE — Therapy (Signed)
[place], [place], [address] Phone: [phone]   Fax:  [phone]  Physical Therapy Treatment  Patient Details  Name: [name] MRN: [id number] Date of Birth: 12-26-1965 Referring Provider: Dr. Posey Pronto   Encounter Date: 02/28/2017      PT End of Session - 02/28/17 0824    Visit Number 7   Number of Visits 8   Date for PT Re-Evaluation 03/01/17   PT Start Time 0800   PT Stop Time 0847   PT Time Calculation (min) 47 min   Activity Tolerance Patient tolerated treatment well   Behavior During Therapy [facility] for tasks assessed/performed      Past Medical History:  Diagnosis Date  . Anginal pain ([place])    chest pain  heart cath done  . Cancer ([place])   . Chronic bronchitis ([place])    "get it q yr"  . Fibromyalgia   . GERD (gastroesophageal reflux disease)   . Hepatic adenoma   . High cholesterol   . History of gout   . History of hiatal hernia   . Hypertension   . Hypothyroidism   . Iron deficiency anemia    "comes and goes"  . Kidney stones   . Migraine    "@ least once/month" (02/18/2015)  . PONV (postoperative nausea and vomiting)   . Proteinuria   . Tachycardia   . Type II diabetes mellitus ([place])     Past Surgical History:  Procedure Laterality Date  . ABDOMINAL HYSTERECTOMY  ~2012   "lap"  . ANKLE RECONSTRUCTION Left 03/29/2016   Procedure: LEFT ANKLE LATERAL LIGAMENT RECONSTRUCTION AND PERONEAL TENDON REPAIR OR TENOLYSIS;  Surgeon: Wylene Simmer, MD;  Location: [place];  Service: Orthopedics;  Laterality: Left;  . BACK SURGERY    . CARDIAC CATHETERIZATION  04/2009   "clean"  . COLONOSCOPY    . CYSTOSCOPY WITH URETEROSCOPY, STONE BASKETRY AND STENT PLACEMENT  ~ 2006  . ESOPHAGOGASTRODUODENOSCOPY    . LAPAROSCOPIC CHOLECYSTECTOMY  05/2004  . LUMBAR DISC SURGERY  08/2011  . THYROIDECTOMY N/A 02/18/2015   Procedure: TOTAL THYROIDECTOMY;  Surgeon: [name], MD;  Location: [place];   Service: General;  Laterality: N/A;  . TOTAL THYROIDECTOMY  02/18/2015    There were no vitals filed for this visit.      Subjective Assessment - 02/28/17 0814    Subjective Patient had a lumbar puncutre last Tuesday. She developed a bad headache during the week and had a blood patch done for a CSF leak. Since the blood patch she has had no headache but has had shooting pain into her buttock at times. The shooting pain can reach a 10/10.    Pertinent History diabetes and neuropathy, COPD, obesity   Limitations Standing;Walking   How long can you sit comfortably? Sitting too long (30 min) increases back discomfort . 45 min to drive to [place] and she has difficulty standing and walking to the door.    How long can you stand comfortably? Foot hurts all the time.  Back hurts the longer she walks or stand 15 min    How long can you walk comfortably? 15 min    Diagnostic tests MRI 04/2016 in lumbar spine: New small broad-based disc protrusion into the left lateral recess at L3-L4, L4-L5 stable . Her NCS/EMG showed acute on chronic sensorimotor polyneuropathy affecting both legs, worse on the right   Patient Stated Goals Would like to get stronger but patient unure if this  can happen, wonders if weakness is from neuropathy or new LS spine issues.    Currently in Pain? Yes   Pain Score 6   can reach a 10/10    Pain Location Back   Pain Orientation Left;Lower   Pain Descriptors / Indicators Aching   Pain Type Chronic pain   Pain Radiating Towards left buttock area    Pain Onset More than a month ago   Pain Frequency Intermittent   Aggravating Factors  walking and standing    Pain Relieving Factors rest    Effect of Pain on Daily Activities can not work    Multiple Pain Sites No            [facility] PT Assessment - 02/28/17 0001      Observation/Other Assessments   Focus on Therapeutic Outcomes (FOTO)  NT FOTO system down today      Sensation   Light Touch Impaired by gross assessment    Additional Comments feels deep pressure, min diminished light touch but poor to absent proprioception in L >R foot      AROM   Lumbar Flexion 48   Lumbar Extension 20   Lumbar - Right Side Bend 25% limited with L pain    Lumbar - Left Side Bend 50% limited with left pain    Lumbar - Right Rotation 25% limited    Lumbar - Left Rotation 25% limited with pain on the lerft      Strength   Right Hip Flexion 5/5   Right Hip ABduction 4+/5   Left Hip Flexion 3/5   Left Hip ABduction 3/5   Right Knee Flexion 5/5   Right Knee Extension 5/5   Left Knee Flexion 4/5   Left Knee Extension 3/5   Right Ankle Dorsiflexion 4/5   Left Ankle Dorsiflexion 3/5     Palpation   Palpation comment Painful lumbar spine to Left and into entire gluteal region.  Tightness throughout hips and lumbar paraspinals.      Ambulation/Gait   Assistive device Straight cane   Gait Pattern Step-to pattern;Antalgic                     [facility] Adult PT Treatment/Exercise - 02/28/17 0001      Self-Care   Self-Care ADL's   ADL's reivewed proper body mecahnics with adls and the improtnace of strengthening for gait. Reviewe HEP      Lumbar Exercises: Stretches   Single Knee to Chest Stretch Limitations 5x10 second holds    Lower Trunk Rotation Limitations 10x    Piriformis Stretch Limitations 5x15 second holds      Lumbar Exercises: Aerobic   Stationary Bike Nustep L 5 LE only x 8 minutes      Knee/Hip Exercises: Seated   Long Arc Quad Left;20 reps   Long Arc Quad Weight 4 lbs.   Marching Limitations let hip flexion x 10       Knee/Hip Exercises: Supine   Bridges Limitations x 10    Bridges with [employer] 10 reps   Straight Leg Raises 10 reps   Straight Leg Raises Limitations with ab set   Straight Leg Raise with External Rotation 10 reps   Straight Leg Raise with External Rotation Limitations with ab set    Other Supine Knee/Hip Exercises ball squeeze x 10                 PT  Education - 02/28/17 8832    Education  provided Yes   Education Details symotm mangement; improtanceof execises    Person(s) Educated Patient   Methods Explanation;Handout   Comprehension Verbalized understanding;Returned demonstration;Verbal cues required;Need further instruction          PT Short Term Goals - 01/30/17 2126      PT SHORT TERM GOAL #1   Title STG=LTG for lumbar spine            PT Long Term Goals - 02/28/17 1302      PT LONG TERM GOAL #1   Title Patient strength in LLE will increase by 1/2 muscle grade throughout to improve gait and safety.    Baseline ambulated in store for 1 hour pain increases from 3/10 to 6/10.    Time 4   Period Weeks   Status Not Met     PT LONG TERM GOAL #2   Title Pt will be able to sit for 45 min car ride and report only min, brief back discomfort upon standing.    Baseline 1 hour with pain, will not return to same job   Time 4   Period Weeks   Status Not Met     PT LONG TERM GOAL #3   Title Pt will stand, walk for up to 15 min in community outing with pain minimally increased from baseline.    Baseline continues to have pain with community ambualtion    Time 4   Period Weeks   Status Not Met     PT LONG TERM GOAL #4   Title Pt will be I with core strengthening program.    Baseline is perfroing exercises without difficulty    Time 4   Period Weeks   Status Achieved     PT LONG TERM GOAL #5   Title Pt will be I with posture, lifting and body mechanics with simple ADLs to preent further increase in back pain .    Baseline has good understanding of body mechanics    Time 4   Period Weeks   Status On-going               Plan - 02/28/17 6283    Clinical Impression Statement Patients strength and range of motion have not changed. She is perfroming her HEP with no change. Her progress has plateaued. Therapy reviewed her exercises and her home exercise program with her. She is indepdnenet with her exercise program.  D/C at this time to HEP.    Clinical Impairments Affecting Rehab Potential weightbearing on LLE limited by pain and ankle surgery   PT Frequency 2x / week   PT Duration 8 weeks   PT Treatment/Interventions ADLs/Self Care Home Management;Moist Heat;Ultrasound;Therapeutic exercise;Manual techniques;Electrical Stimulation;Balance training;Neuromuscular re-education;Patient/family education;Functional mobility training;Therapeutic activities;Dry needling;Taping;Gait training;Cryotherapy   PT Next Visit Plan trunk flexibility, core stab, modalities as needed, manual to L trunk in sidelying;    PT Home Exercise Plan LTR, knee to chest and knee to opposite shoulder, encouraged cont hip abd and clam; seated knee extension ; side lying QL stretch    Consulted and Agree with Plan of Care Patient      Patient will benefit from skilled therapeutic intervention in order to improve the following deficits and impairments:  Abnormal gait, Decreased mobility, Decreased strength, Increased edema, Impaired sensation, Postural dysfunction, Improper body mechanics, Impaired flexibility, Hypomobility, Pain, Obesity, Difficulty walking, Increased fascial restricitons, Decreased range of motion  Visit Diagnosis: Radiculopathy, lumbosacral region  Muscle weakness (generalized)  Difficulty walking  Pain in left  ankle and joints of left foot   PHYSICAL THERAPY DISCHARGE SUMMARY  Visits from Start of Care: 7  Current functional level related to goals / functional outcomes: Continues to have significant weakness on the right    Remaining deficits: Continued lower back pain and difficulty walking    Education / Equipment: HEP Plan: Patient agrees to discharge.  Patient goals were not met. Patient is being discharged due to lack of progress.  ?????       Problem List Patient Active Problem List   Diagnosis Date Noted  . Cellulitis of toe of left foot 03/15/2016  . Toe ulcer ([place]) 03/15/2016  .  Neoplasm of uncertain behavior of thyroid gland, right lobe 02/18/2015  . DIABETES MELLITUS, TYPE II 04/14/2009  . DIABETES MELLITUS, WITH RENAL COMPLICATIONS 77/82/4235  . HYPERLIPIDEMIA 04/14/2009  . OBESITY 04/14/2009  . ANXIETY DEPRESSION 04/14/2009  . HYPERTENSION 04/14/2009  . COPD 04/14/2009  . CHEST PAIN 04/14/2009  . TACHYCARDIA, HX OF 04/14/2009    Carney Living PT DPT  02/28/2017, 1:07 PM  [facility] [address] [place], [place], [address] Phone: [phone]   Fax:  [phone]  Name: [name] MRN: [id number] Date of Birth: 1966/01/01

## 2017-02-28 NOTE — Progress Notes (Signed)
Follow-up Visit   Date: 02/28/17    [name] MRN: [id number] DOB: 07/14/1966   Interim History: [name] is a 51 y.o. right-handed Caucasian female with insulin-dependent diabetes mellitus, thyroid cancer s/p resection secondary hypothyroidism (2016), hypertension, hyperlipidemia, GERD, s/p left L5 hemilaminectomy and discectomy, chronic left ankle pain who came as a walk-in because of new severe low back pain.  History of present illness: In 2012, she developed severe radicular left leg and foot pain and foot drop and was found to have left L5-S1 herniated disc and spinal stenosis at this this level.  She has left L5 hemilaminectomy and discectomy by Dr. Arnoldo Morale.  Pain was significantly improved post-op and over the next few months, she was able to extend her toes and foot.  She has residual reduced sensation of the left great toe.    Starting in 2016, she had a spell of night sweats and burning sensation of the body.  She also has SOB and tachycardia and was later found to have thyroid cancer.  She also was treated with radioactive iodine later because her margins were not clean.  By June 2016, she was feeling very fatigued and thought it was due to deconditioning so started walking, but then developed left knee pain.  She attributed this to being overweight and sedentary.  She continued to walk and later left hip pain and left foot pain. Ankle pain was deep and throbbing. She was recommended to immobilize the foot due to possible stress fracture.  She saw Dr. Doran Durand, orthopaedics, who recommended that she wear a cast.  She recalls it being better while wearing a cast, but never completely resolved. Once the cast was removed, she realized that she had weakness of the foot with eversion, inversion, and dorsiflexion.  Weight bearing exacerbates her pain, so she often stands only on her right leg.  She has failed extensive treatment with PT, bracing, pain meds and  immobilization and underwent left ankle surgery with ligament reconstruction and tendonlysis for instability and tendonitis in April 2017.  Unfortuantely, this did not improve her pain and she was referred to pain management for complex regional pain syndrome, however they did not feel that she had this.  She did physical therapy and was able to improve left dorsiflexion some.  Because of ongoing deep, throbbing pain of the left ankle, she was referred for NCS/EMG to be sure nerve etiology was not missed.   Her NCS/EMG showed acute on chronic sensorimotor polyneuropathy affecting both legs, worse on the right.  When asked if she has numbness/tingling/burning pain, she specifically denies this.  She is very surprised that her testing shows abnormalities in her "good leg".    Around summer 2017, she started having new low back pain.  Pain is sharp and localized to her low back and radiates into her left buttocks, but does not radiate into her leg.  She saw Dr. Arnoldo Morale again and MRI lumbar spine which showed small L3-4 disc protrusion near the left L4 nerve root.  Post-op changes at left L5-S1 was stable.   UPDATE 02/28/2017:  Patient came to the clinic today because of severe low back pain which started following her blood patch last Friday, 3/16.  She unfortunately developed a post-LP headache which did not improve with caffeine and hydration and had great response to blood patch, however she developed acute onset of low back pain immediately following the procedure.  She felt a tight sensation in her lower back as  if there is pressure or a "fist" in her low back.  She has sharp shooting pain going down her back and into her buttocks.  She took Vicodin and flexeril but did not have any benefit.  She continues to have leg weakness, but does not feel this is worse than before.  She denies any bowel/bladder incontinence.    Past Medical History:  Diagnosis Date  . Anginal pain ([place])    chest pain  heart cath  done  . Cancer ([place])   . Chronic bronchitis ([place])    "get it q yr"  . Fibromyalgia   . GERD (gastroesophageal reflux disease)   . Hepatic adenoma   . High cholesterol   . History of gout   . History of hiatal hernia   . Hypertension   . Hypothyroidism   . Iron deficiency anemia    "comes and goes"  . Kidney stones   . Migraine    "@ least once/month" (02/18/2015)  . PONV (postoperative nausea and vomiting)   . Proteinuria   . Tachycardia   . Type II diabetes mellitus ([facility])     Family History  Problem Relation Age of Onset  . Hypertension Mother   . Emphysema Mother   . AAA (abdominal aortic aneurysm) Mother   . Hypertension Father   . Coronary artery disease Father   . Cancer - Prostate Father   . Renal Disease Father   . Hypertension Brother    Social History  Substance Use Topics  . Smoking status: Never Smoker  . Smokeless tobacco: Never Used  . Alcohol use No    Medications:  Current Outpatient Prescriptions on File Prior to Visit  Medication Sig Dispense Refill  . acetaminophen (TYLENOL) 500 MG tablet Take 1,000 mg by mouth every 6 (six) hours as needed for mild pain.    Marland Kitchen ALPRAZolam (XANAX) 0.5 MG tablet Take 0.5 mg by mouth every 6 (six) hours as needed for anxiety or sleep.     Marland Kitchen ammonium lactate (LAC-HYDRIN) 12 % lotion Apply 1 application topically as needed for dry skin.    Marland Kitchen aspirin EC 81 MG tablet Take 1 tablet (81 mg total) by mouth 2 (two) times daily. 84 tablet 0  . atorvastatin (LIPITOR) 20 MG tablet Take 20 mg by mouth daily at 6 PM.     . BAYER CONTOUR NEXT TEST test strip   4  . Biotin (BIOTIN 5000) 5 MG CAPS Take 5 mg by mouth at bedtime.    . cyanocobalamin (,VITAMIN B-12,) 1000 MCG/ML injection Inject 1 mL (1,000 mcg total) into the muscle every 30 (thirty) days. Start with 1 ml IM daily x 7 days then 1 ml IM weekly x 4 weeks then 1 ml IM monthly x 1 year. 25 mL 0  . cyclobenzaprine (FLEXERIL) 10 MG tablet Take 10 mg by mouth at bedtime as  needed for muscle spasms.     . Dermatological Products, Misc. (HYLATOPIC PLUS) CREA Apply 1 application topically 4 (four) times daily.    Marland Kitchen dexlansoprazole (DEXILANT) 60 MG capsule Take 60 mg by mouth every morning. 0600    . fluticasone (FLONASE) 50 MCG/ACT nasal spray Place 1 spray into the nose daily. Reported on 12/27/2015    . furosemide (LASIX) 40 MG tablet Take 40 mg by mouth 2 (two) times daily.      . halobetasol (ULTRAVATE) 0.05 % cream Apply 1 application topically 2 (two) times daily.    Marland Kitchen HYDROcodone-acetaminophen (NORCO) 7.5-325 MG  tablet Take 1 tablet by mouth as needed for pain.    . hyoscyamine (LEVSIN SL) 0.125 MG SL tablet Place 0.125 mg under the tongue every 4 (four) hours as needed for cramping.    Marland Kitchen ibuprofen (ADVIL,MOTRIN) 200 MG tablet Take 600-800 mg by mouth every 8 (eight) hours as needed for moderate pain.    Marland Kitchen insulin regular human CONCENTRATED (HUMULIN R) 500 UNIT/ML SOLN injection Inject 2 Units into the skin. 2.0 units /hour 0500-2359, 1.5units 6644-0347     . INVOKAMET 50-1000 MG TABS Take 1 tablet by mouth 2 (two) times daily.   5  . levothyroxine (SYNTHROID, LEVOTHROID) 125 MCG tablet Take 250 mcg by mouth daily before breakfast. 250 mcg daily except 125 mcg on one day per week    . lisinopril (PRINIVIL,ZESTRIL) 40 MG tablet Take 40 mg by mouth 2 (two) times daily.     . metoprolol (TOPROL-XL) 50 MG 24 hr tablet Take 100 mg by mouth 2 (two) times daily.     . metroNIDAZOLE (METROCREAM) 0.75 % cream Apply 1 application topically 2 (two) times daily.    . Multiple Vitamins-Minerals (MULTIVITAMIN PO) Take 2 tablets by mouth at bedtime. gummies    . Nutritional Supplements (VITAMIN D BOOSTER PO) Take 50,000 Units by mouth daily.    . ondansetron (ZOFRAN-ODT) 8 MG disintegrating tablet Take 8 mg by mouth every 6 (six) hours as needed for nausea or vomiting.     . Oxymetazoline HCl (RHOFADE) 1 % CREA Apply 1 application topically as needed (for skin irritation).    Marland Kitchen  PROCTOSOL HC 2.5 % rectal cream Apply 1 application topically daily as needed for hemorrhoids.   2  . triamcinolone ointment (KENALOG) 0.1 % Apply 1 application topically as needed (for irritation).     No current facility-administered medications on file prior to visit.     Allergies:  Allergies  Allergen Reactions  . Penicillins Anaphylaxis, Shortness Of Breath, Swelling and Palpitations    Has patient had a PCN reaction causing immediate rash, facial/tongue/throat swelling, SOB or lightheadedness with hypotension: Yes Has patient had a PCN reaction causing severe rash involving mucus membranes or skin necrosis: No Has patient had a PCN reaction that required hospitalization No Has patient had a PCN reaction occurring within the last 10 years: No If all of the above answers are "NO", then may proceed with Cephalosporin use.   . Clarithromycin Nausea And Vomiting    Any antibiotic (doxyclycine, etc)  . Adhesive [Tape] Itching    Redness  . Latex Itching and Rash    Blisters with prolonged contact  . Milk-Related Compounds Other (See Comments)    Patient preference, does not like  . Moxifloxacin Nausea And Vomiting    Needs Zofran or Phenergan    Review of Systems:  CONSTITUTIONAL: No fevers, chills, night sweats, or weight loss.  EYES: No visual changes or eye pain ENT: No hearing changes.  No history of nose bleeds.   RESPIRATORY: No cough, wheezing and shortness of breath.   CARDIOVASCULAR: Negative for chest pain, and palpitations.   GI: Negative for abdominal discomfort, blood in stools or black stools.  No recent change in bowel habits.   GU:  No history of incontinence.   MUSCLOSKELETAL: No history of joint pain or swelling.  No myalgias.   SKIN: Negative for lesions, rash, and itching.   ENDOCRINE: Negative for cold or heat intolerance, polydipsia or goiter.   PSYCH:  No  depression or anxiety symptoms.  NEURO: As Above.   Vital Signs:  BP 108/62   Pulse 92   Ht  '5\' 6"'$  (1.676 m)   Wt 280 lb (127 kg)   BMI 45.19 kg/m    General: She appears very uncomfortable and walks with a cane, very guarded with her movements  Neurological Exam: MENTAL STATUS including orientation to time, place, person, recent and remote memory, attention span and concentration, language, and fund of knowledge is normal.  Speech is not dysarthric.  CRANIAL NERVES:  Face is symmetric.  MOTOR:  She is very tender to palpation over the lumbar region ~L3-5.  No masses, erythema, or signs of infectios  Tone is normal.    Right Upper Extremity:    Left Upper Extremity:    Deltoid  5/5   Deltoid  5/5   Biceps  5/5   Biceps  5/5   Triceps  5/5   Triceps  5/5   Wrist extensors  5/5   Wrist extensors  5/5   Wrist flexors  5/5   Wrist flexors  5/5   Finger extensors  5/5   Finger extensors  5/5   Finger flexors  5/5   Finger flexors  5/5   Dorsal interossei  4/5   Dorsal interossei  4/5   Abductor pollicis  5/5   Abductor pollicis  5/5   Tone (Ashworth scale)  0  Tone (Ashworth scale)  0   Right Lower Extremity:    Left Lower Extremity:    Hip flexors  5/5   Hip flexors  4/5   Hip extensors  5/5   Hip extensors  5/5   Adductor 5/5  Adductor 4/5  Abduction 5/5  Abduction 5/5  Knee flexors  5/5   Knee flexors  4/5    MSRs:    Right                                                                 Left brachioradialis 2+  brachioradialis 2+  biceps 2+  biceps 2+  triceps 2+  triceps 2+  patellar 3+  patellar 3+  ankle jerk 0  ankle jerk 0  Hoffman no  Hoffman no  plantar response down  plantar response down   SENSORY:  Vibration 50% at the knees bilaterally, absent at the ankles  COORDINATION/GAIT:  Gait is antalgic, slow, favoring the right side and assisted with a cane.  Data: NCS/EMG of the arms 01/29/2017: The electrophysiologic testing is most consistent with an active on chronic, distal and symmetric sensorimotor polyneuropathy,  demyelinating and axon loss in type. Overall, these findings are severe in degree electrically.   In addition, the presence of temporal dispersion suggests an acquired condition.  NCS/EMG of the legs 01/10/2017:    The electrophysiologic findings are most consistent with a subacute sensorimotor polyneuropathy, axon loss in type, affecting the lower extremities and worse on the left. Overall, these findings are severe in degree electrically.  MRI lumbar spine 05/09/2016: 1. Postoperative changes on the left at L5-S1 with no adverse features. 2. New small L3-L4 disc protrusion into the left lateral recess at the level of the descending left L4 nerve roots. 3. L4-L5 disc and posterior element degeneration appear stable since 2012 with mild bilateral foraminal stenosis.  Labs  01/25/2017:  ESR 10, CRP 0.3, vitamin B12 256, ACE 5, MMA 231, ANA neg, SSA/B neg, copper 92, vitamin B1 6*, SPEP with IFE no M protein  CSF 02/19/2017:  R1200, W0 G76 P37  ACE 10, IgG index 0.47, Lyme neg, cytology neg, no OCB   IMPRESSION/PLAN: 1.  Acute midline low back pain with radicular pain following epidural blood patch - New problem with high complexity decision making.   With her increased patella reflexes and left leg weakness (worse), need to evaluate for epidural hematoma, cyst, or other structural pathology in the lumbar region.  She will be scheduled for STAT MRI lumbar spine.  In the meantime, recommend that she rest and ice her low back.  Increase flexeril to 72m in the morning and continue 189mat bedtime.  Increase gabapentin to 30051mhree times daily.  2.  Subacute sensorimotor polyneuropathy affecting the upper and lower extremities (glove-stocking distribution), worse on the left.   EDX shows severe neuropathy with fibrillation potential involving both arms legs, even though she is relatively asymptomatic in the right leg. She underwent CSF testing which returned normal, with no evidence of an inflammatory  mediated-process.  Serology testing also showed normal inflammatory markers.  Her vitamin B12 and vitamin B1 were both reduced and she has been started on supplementation.  Continue monthly vitamin B12 injections. Start vitamin B1 100m29mily  3.  Left hip flexion weakness.  Possible due to left L4 radiculopathy.  There is some mild impingement of the left L4 nerve on her imaging from May 2017.  If she does not have improvement with PT, this will need to be readdressed.  Return to clinic in 2-3 weeks  The duration of this appointment visit was 30 minutes of face-to-face time with the patient.  Greater than 50% of this time was spent in counseling, explanation of diagnosis, planning of further management, and coordination of care.   Thank you for allowing me to participate in patient's care.  If I can answer any additional questions, I would be pleased to do so.    Sincerely,    Kalan Yeley K. PatePosey Pronto

## 2017-02-28 NOTE — Telephone Encounter (Signed)
[name] called to say her spinal headache is all gone from the Blood Patch we did 02/22/17, but she has ongoing significant low back pain radiating to left flank, buttocks and leg since this procedure.  She is taking Flexeril and Vicodin which helps a little for a little while but knocks her out.  During our 18 minute conversation, I noticed, and shared with her, that a lumbar MRI in May 2017 showed a new small disc protrusion at L3-L4, which is just where the Blood Patch was performed.  After much conversation, she is going to contact Dr. Forde Dandy (her PCP) and/or Dr. Posey Pronto (neuroology) to see about an LESI or some other treatment for this inflamed area.  Her nephrologist does not want her taking NSAIDs, for the record.  Brita Romp, RN

## 2017-02-28 NOTE — Telephone Encounter (Signed)
error 

## 2017-03-01 ENCOUNTER — Telehealth: Payer: Self-pay | Admitting: *Deleted

## 2017-03-01 NOTE — Telephone Encounter (Signed)
Patient given results and instructions via My Chart.   

## 2017-03-01 NOTE — Telephone Encounter (Signed)
-----   Message from Alda Berthold, DO sent at 03/01/2017  8:07 AM EDT ----- Please inform patient that her MRI looks stable and does not show any mass or collection of blood.  Her pain is most likely due muscle spasm and continue treatment like I mentioned yesterday - rest, ice, muscle relaxants, and NSAIDs.  There is a small disc herniation affecting left side, which may be contributing to her left leg weakness.  Continue PT, if able.  Thanks.

## 2017-03-03 ENCOUNTER — Other Ambulatory Visit: Payer: 59

## 2017-03-04 ENCOUNTER — Encounter: Payer: Self-pay | Admitting: *Deleted

## 2017-03-04 ENCOUNTER — Other Ambulatory Visit: Payer: Self-pay | Admitting: *Deleted

## 2017-03-04 ENCOUNTER — Telehealth: Payer: Self-pay | Admitting: Neurology

## 2017-03-04 DIAGNOSIS — E079 Disorder of thyroid, unspecified: Secondary | ICD-10-CM | POA: Diagnosis not present

## 2017-03-04 DIAGNOSIS — F329 Major depressive disorder, single episode, unspecified: Secondary | ICD-10-CM | POA: Diagnosis not present

## 2017-03-04 DIAGNOSIS — I129 Hypertensive chronic kidney disease with stage 1 through stage 4 chronic kidney disease, or unspecified chronic kidney disease: Secondary | ICD-10-CM | POA: Diagnosis not present

## 2017-03-04 DIAGNOSIS — E669 Obesity, unspecified: Secondary | ICD-10-CM | POA: Diagnosis not present

## 2017-03-04 DIAGNOSIS — E118 Type 2 diabetes mellitus with unspecified complications: Secondary | ICD-10-CM | POA: Diagnosis not present

## 2017-03-04 DIAGNOSIS — Z79899 Other long term (current) drug therapy: Secondary | ICD-10-CM | POA: Diagnosis not present

## 2017-03-04 DIAGNOSIS — R809 Proteinuria, unspecified: Secondary | ICD-10-CM | POA: Diagnosis not present

## 2017-03-04 DIAGNOSIS — E039 Hypothyroidism, unspecified: Secondary | ICD-10-CM | POA: Diagnosis not present

## 2017-03-04 DIAGNOSIS — E785 Hyperlipidemia, unspecified: Secondary | ICD-10-CM | POA: Diagnosis not present

## 2017-03-04 DIAGNOSIS — N181 Chronic kidney disease, stage 1: Secondary | ICD-10-CM | POA: Diagnosis not present

## 2017-03-04 MED ORDER — KETOROLAC TROMETHAMINE 10 MG PO TABS: 10.0000 mg | ORAL_TABLET | Freq: Three times a day (TID) | ORAL | 0 refills | Status: DC | PRN

## 2017-03-04 MED ORDER — PREDNISONE 10 MG PO TABS: 10.0000 mg | ORAL_TABLET | Freq: Every day | ORAL | 0 refills | Status: DC

## 2017-03-04 NOTE — Telephone Encounter (Signed)
I called patient back and informed her that I had spoken with Dr. Posey Pronto.  She suggested maybe going to UC for toradol injection or prednisone pack.  Patient requested toradol tablets and prednisone pack.  Both sent to [name].

## 2017-03-04 NOTE — Telephone Encounter (Signed)
PT called and said her back is no better and it hurts to walk/Dawn

## 2017-03-05 ENCOUNTER — Ambulatory Visit: Payer: Self-pay | Admitting: Pharmacist

## 2017-03-12 DIAGNOSIS — L719 Rosacea, unspecified: Secondary | ICD-10-CM | POA: Diagnosis not present

## 2017-03-14 ENCOUNTER — Ambulatory Visit (INDEPENDENT_AMBULATORY_CARE_PROVIDER_SITE_OTHER): Payer: 59 | Admitting: Podiatry

## 2017-03-14 ENCOUNTER — Encounter: Payer: Self-pay | Admitting: Podiatry

## 2017-03-14 ENCOUNTER — Ambulatory Visit (INDEPENDENT_AMBULATORY_CARE_PROVIDER_SITE_OTHER): Payer: 59

## 2017-03-14 DIAGNOSIS — M779 Enthesopathy, unspecified: Secondary | ICD-10-CM | POA: Diagnosis not present

## 2017-03-14 DIAGNOSIS — L97521 Non-pressure chronic ulcer of other part of left foot limited to breakdown of skin: Secondary | ICD-10-CM

## 2017-03-14 MED ORDER — MUPIROCIN 2 % EX OINT: 1.0000 "application " | TOPICAL_OINTMENT | Freq: Two times a day (BID) | CUTANEOUS | 2 refills | Status: DC

## 2017-03-15 ENCOUNTER — Telehealth: Payer: Self-pay | Admitting: Neurology

## 2017-03-15 NOTE — Telephone Encounter (Signed)
Caller: PT  Urgent? No  Reason for the call: Changes in arms and hands

## 2017-03-18 DIAGNOSIS — F329 Major depressive disorder, single episode, unspecified: Secondary | ICD-10-CM | POA: Diagnosis not present

## 2017-03-18 NOTE — Progress Notes (Signed)
Patient ID: [name], female   DOB: 1966/06/17, 51 y.o.   MRN: [id number]  Subjective: 51 year old female presents today for follow-up of wound on the bottom of the left hallux. Denies any drainage or pus. She has continue with daily dressing changes with antibiotic ointment. Denies any swelling or redness.  She also states that she has pain in the outside aspect of her left foot area she denies any recent falls.  Since last appointment she is also been following with neurology for significant neuropathy She is still undergoing testing for this. She is also going to rehab.   Denies any systemic complaints such as fevers, chills, nausea, vomiting. No acute changes since last appointment, and no other complaints at this time.   Objective: AAO x3, NAD DP/PT pulses palpable bilaterally, CRT less than 3 seconds On the plantar aspect of the hallux is a hyperkeratotic lesion with a central fissure. Just inferior to the area of the wound is a skin fissure present within the sulcus of the toe. There is no probing, undermining tunneling. There is no drainage or pus. There is no swelling erythema, ascending cellulitis, fluctuance, crepitus, malodor.  There is tenderness of the lateral aspect of the foot and there is mild edema to the foot there is no erythema or increase in warmth. There is no specific area pinpoint tenderness. Mild tailors on the insertion of the peroneal tendon to the fifth metatarsal base. The peroneal tendon appears to be intact. No pain with calf compression, swelling, warmth, erythema  Assessment: Wound left hallux; tendinitis.  Plan: -All treatment options discussed with the patient including all alternatives, risks, complications.  -Wound, skin fissure were sharply debrided to without complications. Will do mupirocin ointment to the toe and this was ordered today.  -Continue with neurology follow-up. -X-rays were obtained and reviewed today. No evidence of acute  fracture identified. Recommend ice to the foot intermittently as well as potential brace if needed. Her symptoms she states have improved we will hold off on that today. -Monitor for any clinical signs or symptoms of infection and directed to call the office immediately should any occur or go to the ER. - Follow-up as scheduled or sooner if needed.   Celesta Gentile, DPM

## 2017-03-18 NOTE — Telephone Encounter (Signed)
Noted.  Her testing shows she has neuropathy in the upper extremities.  We will discuss further at her next visit.  Anitta Tenny K. Posey Pronto, DO

## 2017-03-18 NOTE — Telephone Encounter (Signed)
Patient is now having tingling in her fingertips and she is dropping things.  She just wanted to update you with these changes.

## 2017-03-20 ENCOUNTER — Ambulatory Visit: Payer: Self-pay | Admitting: Neurology

## 2017-03-25 DIAGNOSIS — F329 Major depressive disorder, single episode, unspecified: Secondary | ICD-10-CM | POA: Diagnosis not present

## 2017-04-02 DIAGNOSIS — E784 Other hyperlipidemia: Secondary | ICD-10-CM | POA: Diagnosis not present

## 2017-04-02 DIAGNOSIS — Z1389 Encounter for screening for other disorder: Secondary | ICD-10-CM | POA: Diagnosis not present

## 2017-04-02 DIAGNOSIS — I951 Orthostatic hypotension: Secondary | ICD-10-CM | POA: Diagnosis not present

## 2017-04-02 DIAGNOSIS — N08 Glomerular disorders in diseases classified elsewhere: Secondary | ICD-10-CM | POA: Diagnosis not present

## 2017-04-02 DIAGNOSIS — E1142 Type 2 diabetes mellitus with diabetic polyneuropathy: Secondary | ICD-10-CM | POA: Diagnosis not present

## 2017-04-02 DIAGNOSIS — E11621 Type 2 diabetes mellitus with foot ulcer: Secondary | ICD-10-CM | POA: Diagnosis not present

## 2017-04-02 DIAGNOSIS — C73 Malignant neoplasm of thyroid gland: Secondary | ICD-10-CM | POA: Diagnosis not present

## 2017-04-02 DIAGNOSIS — G894 Chronic pain syndrome: Secondary | ICD-10-CM | POA: Diagnosis not present

## 2017-04-02 DIAGNOSIS — E1129 Type 2 diabetes mellitus with other diabetic kidney complication: Secondary | ICD-10-CM | POA: Diagnosis not present

## 2017-04-02 DIAGNOSIS — F329 Major depressive disorder, single episode, unspecified: Secondary | ICD-10-CM | POA: Diagnosis not present

## 2017-04-03 ENCOUNTER — Ambulatory Visit: Payer: 59

## 2017-04-04 ENCOUNTER — Ambulatory Visit (INDEPENDENT_AMBULATORY_CARE_PROVIDER_SITE_OTHER): Payer: 59 | Admitting: Podiatry

## 2017-04-04 ENCOUNTER — Ambulatory Visit: Payer: 59 | Admitting: Podiatry

## 2017-04-04 ENCOUNTER — Other Ambulatory Visit: Payer: 59

## 2017-04-04 ENCOUNTER — Encounter: Payer: Self-pay | Admitting: Podiatry

## 2017-04-04 DIAGNOSIS — E1149 Type 2 diabetes mellitus with other diabetic neurological complication: Secondary | ICD-10-CM

## 2017-04-04 DIAGNOSIS — L97521 Non-pressure chronic ulcer of other part of left foot limited to breakdown of skin: Secondary | ICD-10-CM

## 2017-04-04 DIAGNOSIS — M21372 Foot drop, left foot: Secondary | ICD-10-CM | POA: Diagnosis not present

## 2017-04-04 DIAGNOSIS — M779 Enthesopathy, unspecified: Secondary | ICD-10-CM

## 2017-04-04 DIAGNOSIS — E1140 Type 2 diabetes mellitus with diabetic neuropathy, unspecified: Secondary | ICD-10-CM | POA: Diagnosis not present

## 2017-04-04 NOTE — Progress Notes (Signed)
Patient ID: [name], female   DOB: November 10, 1966, 51 y.o.   MRN: [id number]  Subjective: 51 year old female presents today for follow-up of wound on the bottom of the left hallux. Denies any drainage or pus. Overall she doesn't the wound is improving. She has continue with daily dressing changes with antibiotic ointment. Denies any swelling or redness.  She also presents today for possible bracing to be evaluated by [name]. She states that she has difficulty bringing her foot up and this is causing her trip. She also states that she has no side-to-side stability. She's been doing home exercises and has not noticed any great improvement in her strength or range of motion.  Denies any systemic complaints such as fevers, chills, nausea, vomiting. No acute changes since last appointment, and no other complaints at this time.   Objective: AAO x3, NAD DP/PT pulses palpable bilaterally, CRT less than 3 seconds On the plantar aspect of the hallux is a hyperkeratotic lesion with a central fissure. Just inferior to the area of the wound is a skin fissure present within the sulcus of the toe. Areas appear to be healing. The wounds appear to be very superficial. There is no probing, undermining or tunneling. There is no surrounding erythema, ascending synovitis. Is no fluctuance, crepitus, malodor.  Manual muscle testing decrease in dorsiflexion the left ankle. There is no inversion and eversion.  No pain with calf compression, swelling, warmth, erythema  Assessment: Wound left hallux; tendinitis; dropfoot; neuropathy  Plan: -All treatment options discussed with the patient including all alternatives, risks, complications.  -Wound, skin fissure were sharply debrided to without complications. Continue with mupirocin ointment daily. Monitor for any clinical signs or symptoms of infection and directed to call the office immediately should any occur or go to the ER. -She was also evaluated today by  [facility] for brace. She is going to come back in the near future for casting. Will check with insurance.  -RTC 4 weeks or sooner if needed.  Celesta Gentile, DPM

## 2017-04-08 DIAGNOSIS — F329 Major depressive disorder, single episode, unspecified: Secondary | ICD-10-CM | POA: Diagnosis not present

## 2017-04-09 ENCOUNTER — Ambulatory Visit (INDEPENDENT_AMBULATORY_CARE_PROVIDER_SITE_OTHER): Payer: Self-pay | Admitting: Podiatry

## 2017-04-09 DIAGNOSIS — M21372 Foot drop, left foot: Secondary | ICD-10-CM

## 2017-04-09 NOTE — Progress Notes (Signed)
Saw patient today for casting custom AFO ([place] style)..Patient presents with severe ankle instability and foot drop.   Everters/inverters ROM severely limited left 2/5.Marland KitchenMarland KitchenMarland KitchenDorsiflextion limited as well 3/5.   Patient needs articulating AFO to assist with Foot drop..Patient tolerated procedure well.

## 2017-04-10 ENCOUNTER — Ambulatory Visit: Payer: 59 | Attending: Neurology

## 2017-04-10 DIAGNOSIS — R262 Difficulty in walking, not elsewhere classified: Secondary | ICD-10-CM | POA: Diagnosis not present

## 2017-04-10 DIAGNOSIS — R2681 Unsteadiness on feet: Secondary | ICD-10-CM | POA: Insufficient documentation

## 2017-04-10 DIAGNOSIS — M25552 Pain in left hip: Secondary | ICD-10-CM | POA: Diagnosis not present

## 2017-04-10 DIAGNOSIS — M25572 Pain in left ankle and joints of left foot: Secondary | ICD-10-CM | POA: Diagnosis not present

## 2017-04-10 DIAGNOSIS — M5417 Radiculopathy, lumbosacral region: Secondary | ICD-10-CM | POA: Diagnosis not present

## 2017-04-10 NOTE — Therapy (Signed)
[place], [place], [address] Phone: [phone]   Fax:  [phone]  Physical Therapy Evaluation/FCE  Patient Details  Name: [name] MRN: [id number] Date of Birth: 1966/05/17 Referring Provider: Reynold Bowen, MD  Encounter Date: 04/10/2017      PT End of Session - 04/10/17 1140    Visit Number 1   Number of Visits 1   Date for PT Re-Evaluation 04/10/17   Authorization Type [facility] UMR   PT Start Time 0700   PT Stop Time 1050   PT Time Calculation (min) 230 min   Activity Tolerance Patient tolerated treatment well;Patient limited by pain   Behavior During Therapy [facility] for tasks assessed/performed      Past Medical History:  Diagnosis Date  . Anginal pain ([place])    chest pain  heart cath done  . Cancer ([place])   . Chronic bronchitis ([place])    "get it q yr"  . Fibromyalgia   . GERD (gastroesophageal reflux disease)   . Hepatic adenoma   . High cholesterol   . History of gout   . History of hiatal hernia   . Hypertension   . Hypothyroidism   . Iron deficiency anemia    "comes and goes"  . Kidney stones   . Migraine    "@ least once/month" (02/18/2015)  . PONV (postoperative nausea and vomiting)   . Proteinuria   . Tachycardia   . Type II diabetes mellitus ([place])     Past Surgical History:  Procedure Laterality Date  . ABDOMINAL HYSTERECTOMY  ~2012   "lap"  . ANKLE RECONSTRUCTION Left 03/29/2016   Procedure: LEFT ANKLE LATERAL LIGAMENT RECONSTRUCTION AND PERONEAL TENDON REPAIR OR TENOLYSIS;  Surgeon: Wylene Simmer, MD;  Location: [place];  Service: Orthopedics;  Laterality: Left;  . BACK SURGERY    . CARDIAC CATHETERIZATION  04/2009   "clean"  . COLONOSCOPY    . CYSTOSCOPY WITH URETEROSCOPY, STONE BASKETRY AND STENT PLACEMENT  ~ 2006  . ESOPHAGOGASTRODUODENOSCOPY    . LAPAROSCOPIC CHOLECYSTECTOMY  05/2004  . LUMBAR DISC SURGERY  08/2011  . THYROIDECTOMY N/A 02/18/2015   Procedure:  TOTAL THYROIDECTOMY;  Surgeon: [name], MD;  Location: [place];  Service: General;  Laterality: N/A;  . TOTAL THYROIDECTOMY  02/18/2015    There were no vitals filed for this visit.       Subjective Assessment - 04/10/17 1138    Subjective See FCE report             [facility] PT Assessment - 04/10/17 0001      Assessment   Medical Diagnosis Lower back and left hip and ankle pain   Referring Provider Reynold Bowen, MD                           PT Education - 04/10/17 1140    Education provided Yes   Education Details Results of FCE reviewed with [name]) Educated Patient   Methods Explanation   Comprehension Verbalized understanding          PT Short Term Goals - 01/30/17 2126      PT SHORT TERM GOAL #1   Title STG=LTG for lumbar spine            PT Long Term Goals - 02/28/17 1302      PT LONG TERM GOAL #1   Title Patient strength in LLE will increase by  1/2 muscle grade throughout to improve gait and safety.    Baseline ambulated in store for 1 hour pain increases from 3/10 to 6/10.    Time 4   Period Weeks   Status Not Met     PT LONG TERM GOAL #2   Title Pt will be able to sit for 45 min car ride and report only min, brief back discomfort upon standing.    Baseline 1 hour with pain, will not return to same job   Time 4   Period Weeks   Status Not Met     PT LONG TERM GOAL #3   Title Pt will stand, walk for up to 15 min in community outing with pain minimally increased from baseline.    Baseline continues to have pain with community ambualtion    Time 4   Period Weeks   Status Not Met     PT LONG TERM GOAL #4   Title Pt will be I with core strengthening program.    Baseline is perfroing exercises without difficulty    Time 4   Period Weeks   Status Achieved     PT LONG TERM GOAL #5   Title Pt will be I with posture, lifting and body mechanics with simple ADLs to preent further increase in back pain .     Baseline has good understanding of body mechanics    Time 4   Period Weeks   Status On-going       FCE to be faxed to Dr Forde Dandy        Plan - 04/10/17 1142    Clinical Impression Statement [name] completed the FCE process with increased pain. She was linmited due to pain and weakness but decreased balance may be a significant factor in functional limitations   PT Frequency One time visit   PT Next Visit Plan No follow up    Consulted and Agree with Plan of Care Patient      Patient will benefit from skilled therapeutic intervention in order to improve the following deficits and impairments:     Visit Diagnosis: Radiculopathy, lumbosacral region  Pain in left ankle and joints of left foot  Pain in left hip  Unsteadiness on feet  Difficulty in walking, not elsewhere classified     Problem List Patient Active Problem List   Diagnosis Date Noted  . Cellulitis of toe of left foot 03/15/2016  . Toe ulcer ([place]) 03/15/2016  . Neoplasm of uncertain behavior of thyroid gland, right lobe 02/18/2015  . DIABETES MELLITUS, TYPE II 04/14/2009  . DIABETES MELLITUS, WITH RENAL COMPLICATIONS 62/37/6283  . HYPERLIPIDEMIA 04/14/2009  . OBESITY 04/14/2009  . ANXIETY DEPRESSION 04/14/2009  . HYPERTENSION 04/14/2009  . COPD 04/14/2009  . CHEST PAIN 04/14/2009  . TACHYCARDIA, HX OF 04/14/2009    Darrel Hoover PT 04/10/2017, 11:47 AM  [facility] [address] [place], [place], [address] Phone: [phone]   Fax:  [phone]  Name: [name] MRN: [id number] Date of Birth: [birth year] PHYSICAL THERAPY DISCHARGE SUMMARY  Visits from Start of Care: FCE only  Current functional level related to goals / functional outcomes:  FCE completed see copy in EPIC    Remaining deficits: See above   Education / Equipment: NA Plan: Patient agrees to discharge.  Patient goals were not met. Patient is being  discharged due to                                                     ?????  Completed the FCE

## 2017-04-12 DIAGNOSIS — E108 Type 1 diabetes mellitus with unspecified complications: Secondary | ICD-10-CM | POA: Diagnosis not present

## 2017-04-16 DIAGNOSIS — F329 Major depressive disorder, single episode, unspecified: Secondary | ICD-10-CM | POA: Diagnosis not present

## 2017-04-22 DIAGNOSIS — F329 Major depressive disorder, single episode, unspecified: Secondary | ICD-10-CM | POA: Diagnosis not present

## 2017-04-26 ENCOUNTER — Ambulatory Visit (INDEPENDENT_AMBULATORY_CARE_PROVIDER_SITE_OTHER): Payer: 59 | Admitting: Neurology

## 2017-04-26 ENCOUNTER — Encounter: Payer: Self-pay | Admitting: Neurology

## 2017-04-26 VITALS — BP 106/74 | HR 86 | Ht 66.0 in | Wt 278.0 lb

## 2017-04-26 DIAGNOSIS — M5417 Radiculopathy, lumbosacral region: Secondary | ICD-10-CM | POA: Diagnosis not present

## 2017-04-26 DIAGNOSIS — E0842 Diabetes mellitus due to underlying condition with diabetic polyneuropathy: Secondary | ICD-10-CM

## 2017-04-26 DIAGNOSIS — G629 Polyneuropathy, unspecified: Secondary | ICD-10-CM

## 2017-04-26 DIAGNOSIS — G8314 Monoplegia of lower limb affecting left nondominant side: Secondary | ICD-10-CM

## 2017-04-26 MED ORDER — GABAPENTIN 300 MG PO CAPS: 600.0000 mg | ORAL_CAPSULE | Freq: Three times a day (TID) | ORAL | 3 refills | Status: DC

## 2017-04-26 NOTE — Patient Instructions (Addendum)
1.  Continue vitamin B12 monthly injections 2.  Continue thiamine 100mg  daily 3.  Please let me know if you would like to   - Proceed with referral to Dr. Arnoldo Morale for opinion on left thigh weakness  - MRI brain or cervical spine  - Genetic testing for hereditary neuropathy  - Second opinion with academic neurology center  4.  Increase gabapentin to 600mg  three times daily  Return to clinic in 4 months

## 2017-04-26 NOTE — Progress Notes (Signed)
Follow-up Visit   Date: 04/26/17    [name] MRN: [id number] DOB: [birth year]   Interim History: [name] is a 51 y.o. right-handed Caucasian female with insulin-dependent diabetes mellitus, thyroid cancer s/p resection secondary hypothyroidism (2016), hypertension, hyperlipidemia, GERD, s/p left L5 hemilaminectomy and discectomy, chronic left ankle pain returning for a follow-up of peripheral neuropathy.  History of present illness: In 2012, she developed severe radicular left leg and foot pain and foot drop and was found to have left L5-S1 herniated disc and spinal stenosis at this this level.  She has left L5 hemilaminectomy and discectomy by Dr. Arnoldo Morale.  Pain was significantly improved post-op and over the next few months, she was able to extend her toes and foot.  She has residual reduced sensation of the left great toe.    Starting in 2016, she had a spell of night sweats and burning sensation of the body.  She also has SOB and tachycardia and was later found to have thyroid cancer.  She also was treated with radioactive iodine later because her margins were not clean.  By June 2016, she was feeling very fatigued and thought it was due to deconditioning so started walking, but then developed left knee pain.  She attributed this to being overweight and sedentary.  She continued to walk and later left hip pain and left foot pain. Ankle pain was deep and throbbing. She was recommended to immobilize the foot due to possible stress fracture.  She saw Dr. Doran Durand, orthopaedics, who recommended that she wear a cast.  She recalls it being better while wearing a cast, but never completely resolved. Once the cast was removed, she realized that she had weakness of the foot with eversion, inversion, and dorsiflexion.  Weight bearing exacerbates her pain, so she often stands only on her right leg.  She has failed extensive treatment with PT, bracing, pain meds and  immobilization and underwent left ankle surgery with ligament reconstruction and tendonlysis for instability and tendonitis in April 2017.  Unfortuantely, this did not improve her pain and she was referred to pain management for complex regional pain syndrome, however they did not feel that she had this.  She did physical therapy and was able to improve left dorsiflexion some.  Because of ongoing deep, throbbing pain of the left ankle, she was referred for NCS/EMG to be sure nerve etiology was not missed.   Her NCS/EMG showed acute on chronic sensorimotor polyneuropathy affecting both legs, worse on the right.  When asked if she has numbness/tingling/burning pain, she specifically denies this.  She is very surprised that her testing shows abnormalities in her "good leg".    Around summer 2017, she started having new low back pain.  Pain is sharp and localized to her low back and radiates into her left buttocks, but does not radiate into her leg.  She saw Dr. Arnoldo Morale again and MRI lumbar spine which showed small L3-4 disc protrusion near the left L4 nerve root.  Post-op changes at left L5-S1 was stable.   UPDATE 02/28/2017:  Patient came to the clinic today because of severe low back pain which started following her blood patch last Friday, 3/16.  She unfortunately developed a post-LP headache which did not improve with caffeine and hydration and had great response to blood patch, however she developed acute onset of low back pain immediately following the procedure.  She felt a tight sensation in her lower back as if there is pressure or  a "fist" in her low back.  She has sharp shooting pain going down her back and into her buttocks.  She took Vicodin and flexeril but did not have any benefit.  She continues to have leg weakness, but does not feel this is worse than before.  She denies any bowel/bladder incontinence.  UPDATE 04/26/2017:  She is here for follow-up appointment.  Since her last visit, she has  noticed new burning and tingling sensation of the hands and is more aware of this.  Her lower leg paresthesias and left hip flexion weakness is unchanged.  She continues to have low back pain ranked as 4/10 and tried physical therapy without any benefit.  She currently takes flexeril at bedtime for this.  She has not had any new falls and walks with a cane as needed.  She is getting fit for a left AFO.  Past Medical History:  Diagnosis Date  . Anginal pain ([place])    chest pain  heart cath done  . Cancer ([place])   . Chronic bronchitis ([place])    "get it q yr"  . Fibromyalgia   . GERD (gastroesophageal reflux disease)   . Hepatic adenoma   . High cholesterol   . History of gout   . History of hiatal hernia   . Hypertension   . Hypothyroidism   . Iron deficiency anemia    "comes and goes"  . Kidney stones   . Migraine    "@ least once/month" (02/18/2015)  . PONV (postoperative nausea and vomiting)   . Proteinuria   . Tachycardia   . Type II diabetes mellitus ([facility])     Family History  Problem Relation Age of Onset  . Hypertension Mother   . Emphysema Mother   . AAA (abdominal aortic aneurysm) Mother   . Hypertension Father   . Coronary artery disease Father   . Cancer - Prostate Father   . Renal Disease Father   . Hypertension Brother    Social History  Substance Use Topics  . Smoking status: Never Smoker  . Smokeless tobacco: Never Used  . Alcohol use No    Medications:  Current Outpatient Prescriptions on File Prior to Visit  Medication Sig Dispense Refill  . acetaminophen (TYLENOL) 500 MG tablet Take 1,000 mg by mouth every 6 (six) hours as needed for mild pain.    Marland Kitchen ALPRAZolam (XANAX) 0.5 MG tablet Take 0.5 mg by mouth every 6 (six) hours as needed for anxiety or sleep.     Marland Kitchen ammonium lactate (LAC-HYDRIN) 12 % lotion Apply 1 application topically as needed for dry skin.    Marland Kitchen aspirin EC 81 MG tablet Take 1 tablet (81 mg total) by mouth 2 (two) times daily. 84 tablet 0  .  atorvastatin (LIPITOR) 20 MG tablet Take 20 mg by mouth daily at 6 PM.     . BAYER CONTOUR NEXT TEST test strip   4  . Biotin (BIOTIN 5000) 5 MG CAPS Take 5 mg by mouth at bedtime.    . cyanocobalamin (,VITAMIN B-12,) 1000 MCG/ML injection Inject 1 mL (1,000 mcg total) into the muscle every 30 (thirty) days. Start with 1 ml IM daily x 7 days then 1 ml IM weekly x 4 weeks then 1 ml IM monthly x 1 year. 25 mL 0  . cyclobenzaprine (FLEXERIL) 10 MG tablet Take 10 mg by mouth at bedtime as needed for muscle spasms.     . Dermatological Products, Misc. (HYLATOPIC PLUS) CREA Apply 1  application topically 4 (four) times daily.    Marland Kitchen dexlansoprazole (DEXILANT) 60 MG capsule Take 60 mg by mouth every morning. 0600    . fluticasone (FLONASE) 50 MCG/ACT nasal spray Place 1 spray into the nose daily. Reported on 12/27/2015    . furosemide (LASIX) 40 MG tablet Take 40 mg by mouth 2 (two) times daily.      . halobetasol (ULTRAVATE) 0.05 % cream Apply 1 application topically 2 (two) times daily.    Marland Kitchen HYDROcodone-acetaminophen (NORCO) 7.5-325 MG tablet Take 1 tablet by mouth as needed for pain.    . hyoscyamine (LEVSIN SL) 0.125 MG SL tablet Place 0.125 mg under the tongue every 4 (four) hours as needed for cramping.    Marland Kitchen ibuprofen (ADVIL,MOTRIN) 200 MG tablet Take 600-800 mg by mouth every 8 (eight) hours as needed for moderate pain.    Marland Kitchen insulin regular human CONCENTRATED (HUMULIN R) 500 UNIT/ML SOLN injection Inject 2 Units into the skin. 2.0 units /hour 0500-2359, 1.5units 5573-2202     . INVOKAMET 50-1000 MG TABS Take 1 tablet by mouth 2 (two) times daily.   5  . levothyroxine (SYNTHROID, LEVOTHROID) 125 MCG tablet Take 250 mcg by mouth daily before breakfast. 250 mcg daily except 125 mcg on one day per week    . lisinopril (PRINIVIL,ZESTRIL) 40 MG tablet Take 40 mg by mouth 2 (two) times daily.     . metoprolol (TOPROL-XL) 50 MG 24 hr tablet Take 100 mg by mouth 2 (two) times daily.     . metroNIDAZOLE  (METROCREAM) 0.75 % cream Apply 1 application topically 2 (two) times daily.    . Multiple Vitamins-Minerals (MULTIVITAMIN PO) Take 2 tablets by mouth at bedtime. gummies    . Nutritional Supplements (VITAMIN D BOOSTER PO) Take 50,000 Units by mouth daily.    . ondansetron (ZOFRAN-ODT) 8 MG disintegrating tablet Take 8 mg by mouth every 6 (six) hours as needed for nausea or vomiting.     . Oxymetazoline HCl (RHOFADE) 1 % CREA Apply 1 application topically as needed (for skin irritation).    Marland Kitchen PROCTOSOL HC 2.5 % rectal cream Apply 1 application topically daily as needed for hemorrhoids.   2  . triamcinolone ointment (KENALOG) 0.1 % Apply 1 application topically as needed (for irritation).     No current facility-administered medications on file prior to visit.     Allergies:  Allergies  Allergen Reactions  . Penicillins Anaphylaxis, Shortness Of Breath, Swelling and Palpitations    Has patient had a PCN reaction causing immediate rash, facial/tongue/throat swelling, SOB or lightheadedness with hypotension: Yes Has patient had a PCN reaction causing severe rash involving mucus membranes or skin necrosis: No Has patient had a PCN reaction that required hospitalization No Has patient had a PCN reaction occurring within the last 10 years: No If all of the above answers are "NO", then may proceed with Cephalosporin use.   . Clarithromycin Nausea And Vomiting    Any antibiotic (doxyclycine, etc)  . Adhesive [Tape] Itching    Redness  . Latex Itching and Rash    Blisters with prolonged contact  . Milk-Related Compounds Other (See Comments)    Patient preference, does not like  . Moxifloxacin Nausea And Vomiting    Needs Zofran or Phenergan    Review of Systems:  CONSTITUTIONAL: No fevers, chills, night sweats, or weight loss.  EYES: No visual changes or eye pain ENT: No hearing changes.  No history of nose bleeds.   RESPIRATORY: No  cough, wheezing and shortness of breath.     CARDIOVASCULAR: Negative for chest pain, and palpitations.   GI: Negative for abdominal discomfort, blood in stools or black stools.  No recent change in bowel habits.   GU:  No history of incontinence.   MUSCLOSKELETAL: No history of joint pain or swelling.  +myalgias.   SKIN: Negative for lesions, rash, and itching.   ENDOCRINE: Negative for cold or heat intolerance, polydipsia or goiter.   PSYCH:  No  depression or anxiety symptoms.   NEURO: As Above.   Vital Signs:  BP 106/74   Pulse 86   Ht '5\' 6"'  (1.676 m)   Wt 278 lb (126.1 kg)   SpO2 95%   BMI 44.87 kg/m   Neurological Exam: MENTAL STATUS including orientation to time, place, person, recent and remote memory, attention span and concentration, language, and fund of knowledge is normal.  Speech is not dysarthric.  CRANIAL NERVES:  Pupils are round and reactive to light.  Extraocular muscles are intact.  Face is symmetric.  Palate elevates symmetrically.   MOTOR: Tone is normal.    Right Upper Extremity:    Left Upper Extremity:    Deltoid  5/5   Deltoid  5/5   Biceps  5/5   Biceps  5/5   Triceps  5/5   Triceps  5/5   Wrist extensors  5/5   Wrist extensors  5/5   Wrist flexors  5/5   Wrist flexors  5/5   Finger extensors  4/5   Finger extensors  4/5   Finger flexors  4/5   Finger flexors  4/5   Dorsal interossei  4/5   Dorsal interossei  4/5   Abductor pollicis  5/5   Abductor pollicis  5/5   Tone (Ashworth scale)  0  Tone (Ashworth scale)  0   Right Lower Extremity:    Left Lower Extremity:    Hip flexors  5/5   Hip flexors  4-/5   Hip extensors  5/5   Hip extensors  5/5   Adductor 5/5  Adductor 4/5  Abduction 5/5  Abduction 5/5  Knee flexors  5/5   Knee flexors  4/5    Inversion 5/5  inversion 2/5  Eversion 5/5  Eversion 2/5  Dorsiflexors  5/5   Dorsiflexors  4/5   Plantarflexors  5/5   Plantarflexors  4/5   Toe extensors  5-/5   Toe extensors  1/5   Toe flexors  5-/5   Toe  flexors  1/5   Tone (Ashworth scale)  0  Tone (Ashworth scale)  0    MSRs:    Right                                                                 Left brachioradialis 2+  brachioradialis 2+  biceps 2+  biceps 2+  triceps 2+  triceps 2+  patellar 3+  patellar 3+  ankle jerk 0  ankle jerk 0  Hoffman no  Hoffman no  plantar response down  plantar response down   SENSORY:  Vibration 50% at the knees bilaterally, absent at the ankles  COORDINATION/GAIT:  Gait is antalgic, slow, favoring the right side and assisted with a cane.  Data: NCS/EMG of  the arms 01/29/2017: The electrophysiologic testing is most consistent with an active on chronic, distal and symmetric sensorimotor polyneuropathy, demyelinating and axon loss in type. Overall, these findings are severe in degree electrically.   In addition, the presence of temporal dispersion suggests an acquired condition.  NCS/EMG of the legs 01/10/2017:    The electrophysiologic findings are most consistent with a subacute sensorimotor polyneuropathy, axon loss in type, affecting the lower extremities and worse on the left. Overall, these findings are severe in degree electrically.  MRI lumbar spine 05/09/2016: 1. Postoperative changes on the left at L5-S1 with no adverse features. 2. New small L3-L4 disc protrusion into the left lateral recess at the level of the descending left L4 nerve roots. 3. L4-L5 disc and posterior element degeneration appear stable since 2012 with mild bilateral foraminal stenosis.  Labs 01/25/2017:  ESR 10, CRP 0.3, vitamin B12 256, ACE 5, MMA 231, ANA neg, SSA/B neg, copper 92, vitamin B1 6*, SPEP with IFE no M protein  CSF 02/19/2017:  R1200, W0 G76 P37  ACE 10, IgG index 0.47, Lyme neg, cytology neg, no OCB  MRI lumbar spine wwo contrast 02/28/2017: 1. Stable appearance of the lumbar spine. No acute abnormality identified. 2. Postoperative changes on the left L5-S1 without residual stenosis. 3. Small left  subarticular disc protrusion at L3-4 encroaching upon the left lateral recess, and potentially affecting the descending left L4 nerve root. 4. Mild bilateral foraminal stenosis at L4-5 related to disc bulge and facet degeneration.   IMPRESSION/PLAN: 1.  Subacute sensorimotor polyneuropathy affecting glove-stocking distribution, worse on the left.   EDX shows severe neuropathy with fibrillation potential involving both arms legs, even though she is relatively asymptomatic in the right leg. She underwent CSF testing which returned normal, with no evidence of an inflammatory mediated-process.  Serology testing also showed normal inflammatory markers.  Her vitamin B12 and vitamin B1 were both reduced and she has been started on supplementation.  Continue monthly vitamin B12 injections. Start vitamin B1 144m daily.  Vitamin deficiency with B12 and B1 can manifest with neuropathy, but I would not expect severe involvement without history of malnutrition.  Therefore, I would like to proceed with genetic testing for hereditary neuropathy.    2.  Left hip flexion weakness.  Possibly due to left L4 nerve impingement.  Patient has not had any improvement with physical therapy. We may seek the opinion of Dr. JArnoldo Moraleas to whether there is enough impingement from her lumbar spine to cause her weakness, by imaging alone this is mild.  With her asymmetric leg weakness, I would recommend MRI brain, even though my clinical suspicion for CNS pathology is low.   3.  Chronic low back pain.  Increase gabapentin to 6022mTID and continue flexeril 33m50mt bedtime.  Given the complexity of her presentation and symptoms.  I also offered the patient a second opinion with an academic center which she will think about.  Return to clinic in 4 months  The duration of this appointment visit was 40 minutes of face-to-face time with the patient.  Greater than 50% of this time was spent in counseling, explanation of diagnosis,  planning of further management, and coordination of care.   Thank you for allowing me to participate in patient's care.  If I can answer any additional questions, I would be pleased to do so.    Sincerely,    Embree Brawley K. PatPosey ProntoO

## 2017-04-29 DIAGNOSIS — F329 Major depressive disorder, single episode, unspecified: Secondary | ICD-10-CM | POA: Diagnosis not present

## 2017-05-02 ENCOUNTER — Telehealth: Payer: Self-pay | Admitting: Podiatry

## 2017-05-02 ENCOUNTER — Ambulatory Visit (INDEPENDENT_AMBULATORY_CARE_PROVIDER_SITE_OTHER): Payer: 59 | Admitting: Podiatry

## 2017-05-02 ENCOUNTER — Encounter: Payer: Self-pay | Admitting: Podiatry

## 2017-05-02 ENCOUNTER — Other Ambulatory Visit: Payer: 59

## 2017-05-02 VITALS — Temp 97.7°F

## 2017-05-02 DIAGNOSIS — R609 Edema, unspecified: Secondary | ICD-10-CM | POA: Diagnosis not present

## 2017-05-02 DIAGNOSIS — M21372 Foot drop, left foot: Secondary | ICD-10-CM | POA: Diagnosis not present

## 2017-05-02 DIAGNOSIS — R52 Pain, unspecified: Secondary | ICD-10-CM

## 2017-05-02 DIAGNOSIS — L97521 Non-pressure chronic ulcer of other part of left foot limited to breakdown of skin: Secondary | ICD-10-CM | POA: Diagnosis not present

## 2017-05-02 NOTE — Telephone Encounter (Signed)
Pt. Needs to speak with you, concerning Brace

## 2017-05-02 NOTE — Telephone Encounter (Signed)
Pt states Dr. Jacqualyn Posey was to speak with [name] concerning the brace and she did not want the brace that Dr. Jacqualyn Posey did not want her to have. I informed Dr. Jacqualyn Posey and he states he has not had a chance to speak with [name], but will. Dr. Jacqualyn Posey states order B/L venous dopplers r/o DVT. I informed pt of Dr. Jacqualyn Posey statement and doppler orders and faxed orders to [facility].

## 2017-05-02 NOTE — Addendum Note (Signed)
Addended by: Harriett Sine D on: 05/02/2017 04:14 PM   Modules accepted: Orders

## 2017-05-07 DIAGNOSIS — F329 Major depressive disorder, single episode, unspecified: Secondary | ICD-10-CM | POA: Diagnosis not present

## 2017-05-08 NOTE — Progress Notes (Signed)
Patient ID: [name], female   DOB: [birth year], 51 y.o.   MRN: [id number]  Subjective: 51 year old female presents today for follow-up of wound on the bottom of the left hallux. Denies any drainage or pus. She states it does fluctuate in size. She has continue with daily dressing changes with antibiotic ointment. Denies any swelling or redness around the wound. She does state the left leg is Somewhat more swollen as well as some mild redness the leg. Denies any pain associated with this.  She is awaiting her brace from [place].   Denies any systemic complaints such as fevers, chills, nausea, vomiting. No acute changes since last appointment, and no other complaints at this time.   Objective: AAO x3, NAD DP/PT pulses palpable bilaterally, CRT less than 3 seconds On the plantar aspect of the hallux is a hyperkeratotic lesion with a central fissure. Just inferior to the area of the wound is a skin fissure present within the sulcus of the toe. This has improved and is superficial. There is no drainage or pus expressed Doyle Askew no clinical signs of infection. Is no fluctuance, crepitus, malodor. The does appear to be a mild increase in swelling and a faint amount of redness to the distal leg but there is no warmth. There is no pain with calf compression. The calf is supple. Manual muscle testing decreased in dorsiflexion the left ankle. There is no inversion and eversion. Dropfoot present No pain with calf compression, swelling, warmth, erythema  Assessment: Wound left hallux; tendinitis; dropfoot; neuropathy  Plan: -All treatment options discussed with the patient including all alternatives, risks, complications.  -Wound, skin fissure were sharply debrided to without complications. Continue with mupirocin ointment daily.  Monitor for any clinical signs or symptoms of infection and directed to call the office immediately should any occur or go to the ER. -Ordered a venous duplex -Awaiting  brace. I was hopefully trying to avoid a brace however her symptoms are not improving despite physical therapy. Hopefully she'll need to be short-term likely more long-term. -RTC 4 weeks or sooner if needed.  Celesta Gentile, DPM

## 2017-05-08 NOTE — Telephone Encounter (Signed)
Spoke to [place] about the brace. The brace has been ordered. Believe this type will help, but no guarantee.

## 2017-05-09 ENCOUNTER — Encounter (HOSPITAL_COMMUNITY): Payer: 59

## 2017-05-13 DIAGNOSIS — F329 Major depressive disorder, single episode, unspecified: Secondary | ICD-10-CM | POA: Diagnosis not present

## 2017-05-15 ENCOUNTER — Encounter: Payer: Self-pay | Admitting: Neurology

## 2017-05-16 ENCOUNTER — Other Ambulatory Visit: Payer: 59

## 2017-05-16 ENCOUNTER — Ambulatory Visit (INDEPENDENT_AMBULATORY_CARE_PROVIDER_SITE_OTHER): Payer: 59 | Admitting: Podiatry

## 2017-05-16 ENCOUNTER — Encounter: Payer: Self-pay | Admitting: Podiatry

## 2017-05-16 ENCOUNTER — Ambulatory Visit (HOSPITAL_COMMUNITY)
Admission: RE | Admit: 2017-05-16 | Discharge: 2017-05-16 | Disposition: A | Discharge status: Home / Self Care | Payer: 59 | Source: Ambulatory Visit | Attending: Cardiovascular Disease | Admitting: Cardiovascular Disease

## 2017-05-16 DIAGNOSIS — R609 Edema, unspecified: Secondary | ICD-10-CM | POA: Diagnosis not present

## 2017-05-16 DIAGNOSIS — M6281 Muscle weakness (generalized): Secondary | ICD-10-CM

## 2017-05-16 DIAGNOSIS — M779 Enthesopathy, unspecified: Secondary | ICD-10-CM

## 2017-05-16 DIAGNOSIS — L97521 Non-pressure chronic ulcer of other part of left foot limited to breakdown of skin: Secondary | ICD-10-CM | POA: Diagnosis not present

## 2017-05-16 DIAGNOSIS — M21372 Foot drop, left foot: Secondary | ICD-10-CM

## 2017-05-16 DIAGNOSIS — R29898 Other symptoms and signs involving the musculoskeletal system: Secondary | ICD-10-CM

## 2017-05-16 DIAGNOSIS — M7989 Other specified soft tissue disorders: Secondary | ICD-10-CM | POA: Diagnosis not present

## 2017-05-16 DIAGNOSIS — R52 Pain, unspecified: Secondary | ICD-10-CM | POA: Insufficient documentation

## 2017-05-16 NOTE — Progress Notes (Addendum)
Patient ID: [name], female   DOB: 1966/05/10, 51 y.o.   MRN: [id number]  Subjective: 51 year old female presents today for follow-up of wound on the bottom of the left hallux. Denies any drainage or pus. She denies he swelling and she denies any acute changes since last appointment in regards the toe. She also presents today to see rick to pick up her brace. Denies any systemic complaints such as fevers, chills, nausea, vomiting. No acute changes since last appointment, and no other complaints at this time.   Objective: AAO x3, NAD DP/PT pulses palpable bilaterally, CRT less than 3 seconds On the plantar aspect of the hallux is a hyperkeratotic lesion with a skin fissure just inferior to this area. Upon debridement underlying wound appears to be mostly healed however it is pre-ulcerative. There is no drainage or pus. There is no surrounding erythema, ascending synovitis. There is no fluctuance, crepitus, malodor. No other open lesions or pre-ulcer lesions identified. Manual muscle testing decreased in dorsiflexion the left ankle. There is no inversion and eversion. Dropfoot present No pain with calf compression, swelling, warmth, erythema  Assessment: Wound left hallux; tendinitis; dropfoot; neuropathy  Plan: -All treatment options discussed with the patient including all alternatives, risks, complications.  -Pre-ulcerative callus, skin fissure were sharply debrided to without complications. Recommend moisturizer daily to the area. Continue offloading. Monitor daily for any skin breakdown or any signs or symptoms of infection. -Continue follow up with neurology -She is scheduled to see rick for brace pickup -Encouraged to stay active -Follow-up as scheduled or sooner if needed.  Celesta Gentile, DPM   ------  Addendum: Patient was also seen today by rick for brace pickup. The brace was fitted by breaking appear to be fitting well. Break in instructions were discussed. If  any skin irritation to stop with the bracing to call the office immediately. She verbalized understanding. She had no further questions. Follow-up as scheduled or sooner if needed. Call any questions or concerns.  Celesta Gentile, DPM

## 2017-05-20 DIAGNOSIS — F329 Major depressive disorder, single episode, unspecified: Secondary | ICD-10-CM | POA: Diagnosis not present

## 2017-05-21 ENCOUNTER — Ambulatory Visit (INDEPENDENT_AMBULATORY_CARE_PROVIDER_SITE_OTHER): Payer: 59 | Admitting: Internal Medicine

## 2017-05-21 VITALS — BP 120/80 | HR 90 | Ht 66.0 in | Wt 277.0 lb

## 2017-05-21 DIAGNOSIS — R002 Palpitations: Secondary | ICD-10-CM

## 2017-05-21 DIAGNOSIS — I951 Orthostatic hypotension: Secondary | ICD-10-CM | POA: Diagnosis not present

## 2017-05-21 DIAGNOSIS — R55 Syncope and collapse: Secondary | ICD-10-CM | POA: Diagnosis not present

## 2017-05-21 NOTE — Progress Notes (Signed)
Patient Care Team: Reynold Bowen, MD as PCP - General (Endocrinology) Jacob Moores, [facility] as [place] [facility] (Pharmacist)   HPI  [name] is a 51 y.o. female Seen in follow-up for recurrent syncope with hypertension documented orthostatic hypotension in the context of diabetes with likely some degree of neuropathy  The last visit recommended compressive clothing as her systolic hypertension made sodium loading a bad idea.  Long-term use of beta blockers have been helpful with exercise intolerance  She is much better with the abdominal binder. She has had 2 episodes of syncope. The first occurred with significant warning and she is wearing her binder that time. She had palpitations nausea with her dizziness prior loss of consciousness. She is also nauseated.  The other episode occurred at home when she got up from the bed not wearing her binder.  She is not able to wear SPANX because she cannot hear taken off adequately for urinating    Records and Results Reviewed   Past Medical History:  Diagnosis Date  . Anginal pain ([place])    chest pain  heart cath done  . Cancer ([place])   . Chronic bronchitis ([place])    "get it q yr"  . Fibromyalgia   . GERD (gastroesophageal reflux disease)   . Hepatic adenoma   . High cholesterol   . History of gout   . History of hiatal hernia   . Hypertension   . Hypothyroidism   . Iron deficiency anemia    "comes and goes"  . Kidney stones   . Migraine    "@ least once/month" (02/18/2015)  . PONV (postoperative nausea and vomiting)   . Proteinuria   . Tachycardia   . Type II diabetes mellitus ([place])     Past Surgical History:  Procedure Laterality Date  . ABDOMINAL HYSTERECTOMY  ~2012   "lap"  . ANKLE RECONSTRUCTION Left 03/29/2016   Procedure: LEFT ANKLE LATERAL LIGAMENT RECONSTRUCTION AND PERONEAL TENDON REPAIR OR TENOLYSIS;  Surgeon: Wylene Simmer, MD;  Location: [facility];  Service:  Orthopedics;  Laterality: Left;  . BACK SURGERY    . CARDIAC CATHETERIZATION  04/2009   "clean"  . COLONOSCOPY    . CYSTOSCOPY WITH URETEROSCOPY, STONE BASKETRY AND STENT PLACEMENT  ~ 2006  . ESOPHAGOGASTRODUODENOSCOPY    . LAPAROSCOPIC CHOLECYSTECTOMY  05/2004  . LUMBAR DISC SURGERY  08/2011  . THYROIDECTOMY N/A 02/18/2015   Procedure: TOTAL THYROIDECTOMY;  Surgeon: [name], MD;  Location: [facility];  Service: General;  Laterality: N/A;  . TOTAL THYROIDECTOMY  02/18/2015    Current Outpatient Prescriptions  Medication Sig Dispense Refill  . acetaminophen (TYLENOL) 500 MG tablet Take 1,000 mg by mouth every 6 (six) hours as needed for mild pain.    Marland Kitchen ALPRAZolam (XANAX) 0.5 MG tablet Take 0.5 mg by mouth every 6 (six) hours as needed for anxiety or sleep.     Marland Kitchen ammonium lactate (LAC-HYDRIN) 12 % lotion Apply 1 application topically as needed for dry skin.    Marland Kitchen aspirin EC 81 MG tablet Take 81 mg by mouth daily.    Marland Kitchen atorvastatin (LIPITOR) 20 MG tablet Take 20 mg by mouth daily at 6 PM.     . BAYER CONTOUR NEXT TEST test strip   4  . Biotin (BIOTIN 5000) 5 MG CAPS Take 5 mg by mouth at bedtime.    . cyanocobalamin (,VITAMIN B-12,) 1000 MCG/ML injection Inject 1 mL (1,000 mcg total) into the  muscle every 30 (thirty) days. Start with 1 ml IM daily x 7 days then 1 ml IM weekly x 4 weeks then 1 ml IM monthly x 1 year. 25 mL 0  . cyclobenzaprine (FLEXERIL) 10 MG tablet Take 10 mg by mouth at bedtime as needed for muscle spasms.     . Dermatological Products, Misc. (HYLATOPIC PLUS) CREA Apply 1 application topically 4 (four) times daily.    Marland Kitchen dexlansoprazole (DEXILANT) 60 MG capsule Take 60 mg by mouth every morning. 0600    . fluticasone (FLONASE) 50 MCG/ACT nasal spray Place 1 spray into the nose daily. Reported on 12/27/2015    . furosemide (LASIX) 40 MG tablet Take 40 mg by mouth 2 (two) times daily.      Marland Kitchen gabapentin (NEURONTIN) 300 MG capsule Take 2 capsules (600 mg total) by mouth 3 (three)  times daily. 540 capsule 3  . halobetasol (ULTRAVATE) 0.05 % cream Apply 1 application topically 2 (two) times daily.    Marland Kitchen HYDROcodone-acetaminophen (NORCO) 7.5-325 MG tablet Take 1 tablet by mouth as needed for pain.    . hyoscyamine (LEVSIN SL) 0.125 MG SL tablet Place 0.125 mg under the tongue every 4 (four) hours as needed for cramping.    Marland Kitchen ibuprofen (ADVIL,MOTRIN) 200 MG tablet Take 600-800 mg by mouth every 8 (eight) hours as needed for moderate pain.    Marland Kitchen insulin regular human CONCENTRATED (HUMULIN R) 500 UNIT/ML SOLN injection Inject 2 Units into the skin. 2.0 units /hour 0500-2359, 1.5units 6270-3500     . INVOKAMET 50-1000 MG TABS Take 1 tablet by mouth 2 (two) times daily.   5  . levothyroxine (SYNTHROID, LEVOTHROID) 125 MCG tablet Take 250 mcg by mouth daily before breakfast. 250 mcg daily except 125 mcg on one day per week    . lisinopril (PRINIVIL,ZESTRIL) 40 MG tablet Take 40 mg by mouth 2 (two) times daily.     . metoprolol (TOPROL-XL) 50 MG 24 hr tablet Take 100 mg by mouth 2 (two) times daily.     . metroNIDAZOLE (METROCREAM) 0.75 % cream Apply 1 application topically 2 (two) times daily.    . Multiple Vitamins-Minerals (MULTIVITAMIN PO) Take 2 tablets by mouth at bedtime. gummies    . Nutritional Supplements (VITAMIN D BOOSTER PO) Take 50,000 Units by mouth daily.    . ondansetron (ZOFRAN-ODT) 8 MG disintegrating tablet Take 8 mg by mouth every 6 (six) hours as needed for nausea or vomiting.     . Oxymetazoline HCl (RHOFADE) 1 % CREA Apply 1 application topically as needed (for skin irritation).    Marland Kitchen PROCTOSOL HC 2.5 % rectal cream Apply 1 application topically daily as needed for hemorrhoids.   2  . triamcinolone ointment (KENALOG) 0.1 % Apply 1 application topically as needed (for irritation).     No current facility-administered medications for this visit.     Allergies  Allergen Reactions  . Penicillins Anaphylaxis, Shortness Of Breath, Swelling and Palpitations    Has  patient had a PCN reaction causing immediate rash, facial/tongue/throat swelling, SOB or lightheadedness with hypotension: Yes Has patient had a PCN reaction causing severe rash involving mucus membranes or skin necrosis: No Has patient had a PCN reaction that required hospitalization No Has patient had a PCN reaction occurring within the last 10 years: No If all of the above answers are "NO", then may proceed with Cephalosporin use.   . Clarithromycin Nausea And Vomiting    Any antibiotic (doxyclycine, etc)  . Adhesive [Tape]  Itching    Redness  . Latex Itching and Rash    Blisters with prolonged contact  . Milk-Related Compounds Other (See Comments)    Patient preference, does not like  . Moxifloxacin Nausea And Vomiting    Needs Zofran or Phenergan      Review of Systems negative except from HPI and PMH  Physical Exam BP 120/80   Pulse 90   Ht 5\' 6"  (1.676 m)   Wt 277 lb (125.6 kg)   PF 98 L/min   BMI 44.71 kg/m  Well developed  Morbidly obese in no acute distress HENT normal E scleral and icterus clear Neck Supple JVP flat; carotids brisk and full Clear to ausculation  Regular rate and rhythm, no murmurs gallops or rub Soft with active bowel sounds No clubbing cyanosis Trace Edema Alert and oriented, grossly normal motor and sensory function Skin Warm and Dry    Assessment and  Plan   Orthostatic hypotension  Syncope  Diabetes  Hypertension  BP is better  Syncope is likely vasomotor. She had reported a blood pressure of 90 after the second episode. We have discussed the importance of recognizing the prodrome. We discussed the importance of being able to get on the ground and significant off the ground. This is a problem for her given her weight. Work at this at home. We discussed the emotional struggles that she is having using a walker with a seat on it.    I stressed the importance of using her abdominal binder a time that she is not sleeping and put  on. She gets up in the morning   We spent more than 50% of our >25 min visit in face to face counseling regarding the above     Current medicines are reviewed at length with the patient today .  The patient does not  have concerns regarding medicines.

## 2017-05-21 NOTE — Patient Instructions (Signed)

## 2017-05-22 ENCOUNTER — Telehealth: Payer: Self-pay | Admitting: *Deleted

## 2017-05-22 NOTE — Telephone Encounter (Addendum)
-----   Message from Trula Slade, DPM sent at 05/21/2017  7:16 AM EDT ----- Normal study- please let her know. Thanks.05/22/2017-Left message requesting call back for results.

## 2017-05-27 ENCOUNTER — Ambulatory Visit (INDEPENDENT_AMBULATORY_CARE_PROVIDER_SITE_OTHER): Payer: 59 | Admitting: Podiatry

## 2017-05-27 DIAGNOSIS — M21372 Foot drop, left foot: Secondary | ICD-10-CM

## 2017-05-27 DIAGNOSIS — L97521 Non-pressure chronic ulcer of other part of left foot limited to breakdown of skin: Secondary | ICD-10-CM | POA: Diagnosis not present

## 2017-05-27 DIAGNOSIS — F3289 Other specified depressive episodes: Secondary | ICD-10-CM | POA: Diagnosis not present

## 2017-05-28 ENCOUNTER — Other Ambulatory Visit: Payer: 59 | Admitting: Orthotics

## 2017-05-28 NOTE — Progress Notes (Signed)
Patient ID: [name], female   DOB: 1966-08-28, 51 y.o.   MRN: [id number]  Subjective: 51 year old female presents today for follow-up of wound on the bottom of the left hallux. She states that she is doing better to the wound. Her only concern is of the brace is not fitting for which she discussed with the [name] as it is causing some skin breakdown. She has stopped wearing the brace and the wounds and healing. She denies any redness or drainage or swelling from around the wounds. She has no new concerns today.  She did need with a disability lawyer to start her disability case.  Denies any systemic complaints such as fevers, chills, nausea, vomiting. No acute changes since last appointment, and no other complaints at this time.   Objective: AAO x3, NAD DP/PT pulses palpable bilaterally, CRT less than 3 seconds On the plantar aspect of the hallux is a hyperkeratotic lesion with a skin fissure just inferior to this area. Upon debridement the wound appears to be healed changes no drainage or pus expressed. There is no surrounding erythema, ascending cellulitis. There is no fluctuance, crepitus, malodor. There are superficial areas of skin breakdown to the posterior heel, dorsal midfoot from the brace. These areas are healing in the scab is starting to form along the dorsal incision. The other areas are more just from irritation skin but no definitive breakdown. No other open lesions or pre-ulcerative lesions identified. Manual muscle testing decreased in dorsiflexion the left ankle. There is no inversion and eversion. Dropfoot present No pain with calf compression, swelling, warmth, erythema  Assessment: Wound left hallux; tendinitis; dropfoot; neuropathy  Plan: -All treatment options discussed with the patient including all alternatives, risks, complications.  -Pre-ulcerative callus, skin fissure were sharply debrided to without complications. Recommend moisturizer daily to the  area. Continue offloading. Monitor daily for any skin breakdown or any signs or symptoms of infection. -Her brace starting sent back for modifications. -Continue follow up with neurology -Encouraged to stay active as possible. -Follow-up as scheduled or sooner if needed.  Celesta Gentile, DPM

## 2017-06-07 ENCOUNTER — Telehealth: Payer: Self-pay | Admitting: Podiatry

## 2017-06-07 ENCOUNTER — Ambulatory Visit (INDEPENDENT_AMBULATORY_CARE_PROVIDER_SITE_OTHER): Payer: 59 | Admitting: Podiatry

## 2017-06-07 ENCOUNTER — Encounter: Payer: Self-pay | Admitting: Podiatry

## 2017-06-07 DIAGNOSIS — L97521 Non-pressure chronic ulcer of other part of left foot limited to breakdown of skin: Secondary | ICD-10-CM

## 2017-06-07 NOTE — Telephone Encounter (Signed)
Pt states she has a deep blister from the ankle brace and the crack under her big toe is wider. I spoke with Dr. Jacqualyn Posey and he states he will see her today at 3:00pm.

## 2017-06-07 NOTE — Telephone Encounter (Signed)
Pt has some concerns about her brace and the bottom of her foot. Just wanted to run them by you.

## 2017-06-07 NOTE — Telephone Encounter (Signed)
Val- can you please call her? Thanks.

## 2017-06-17 ENCOUNTER — Encounter: Payer: Self-pay | Admitting: *Deleted

## 2017-06-17 ENCOUNTER — Other Ambulatory Visit: Payer: Self-pay | Admitting: *Deleted

## 2017-06-17 NOTE — Progress Notes (Signed)
Patient ID: EDYN POPOCA, female   DOB: 04-09-1966, 51 y.o.   MRN: [id number]  Subjective: 51 year old female presents today for evaluation of wound on the bottom of the left hallux. She states that the area started back up and should have the area checked. She also presents to the office they very upset due to insurance issues. Unfortunately her insurance will most likely be canceled after today and she is very concerned about this and having anxiety about this. In regards the wound she denies any drainage or pus and she denies any increase in swelling or red streaks. Denies any systemic complaints such as fevers, chills, nausea, vomiting. No acute changes since last appointment, and no other complaints at this time.   Objective: AAO x3, NAD DP/PT pulses palpable bilaterally, CRT less than 3 seconds On the plantar aspect of the hallux is a hyperkeratotic lesion with a skin fissure just inferior to this area. Upon debridement the skin fissure area appears somewhat deeper how there is no drainage or pus. The actual wound. The hyperkeratotic lesion which is pre-ulcerative however there is no open sores identified this.. On the dorsal aspect of the foot for the brace is rubbing there is a almost a scab formed over this area. There is no surrounding erythema, ascending cellulitis. There is no fluxions or crepitus there is no malodor. No other open lesions identified. No pain with calf compression, swelling, warmth, erythema  Assessment: Wound left hallux  Plan: -All treatment options discussed with the patient including all alternatives, risks, complications.  -Shop and debrided the wound/skin fissure to left plantar hallux. After debridement this appears to be improved and there is no skin depth the area however it is starting to open. Recommended and buttock ointment dressing changes daily. Monitor very closely. Monitor for any clinical signs or symptoms of infection and directed to call the  office immediately should any occur or go to the ER. -We had a discussion regards to her insurance issues today and given her insurance and some guidance in regards to this. -Follow-up as scheduled or sooner if needed.  Celesta Gentile, DPM

## 2017-06-17 NOTE — Patient Outreach (Signed)
[facility]) Care Management   06/17/2017  [name] 26-Feb-1966 [id number]  [name] is an 51 y.o. female who presents to the [place] [facility] office for routine Link To Wellness follow up for self management assistance with Type II DM, HTN, hyperlipidemia and obesity.  Subjective: [name] says she continues to struggle with left ankle, back, and left hip pain. She says she was recently diagnosed with polyneuropathy and is in the process of applying for social security disability because she is unable to work due to the pain and neuropathy issues. She states she saw her podiatrist on 6/29 due to areas of redness on her left foot- heel and the top of foot from wearing a brace to stabilize her ankle. She says she stopped wearing the brace. She will see Dr Jacqualyn Posey again on 7/12. She states she also has an appointment with her neurologist on 9/21 for polyneuropathy.   Objective:   [name] uses a tripod cane with ambulating. She has tennis shoes on, no ankle brace as it caused skin impairment.   Review of Systems  Constitutional: Negative.     Physical Exam  Constitutional: She is oriented to person, place, and time. She appears well-developed and well-nourished.  Cardiovascular: Normal rate and regular rhythm.   Respiratory: Effort normal.  Neurological: She is alert and oriented to person, place, and time.  Skin: Skin is warm and dry.  Psychiatric: She has a normal mood and affect. Her behavior is normal. Judgment and thought content normal.    Today's Vitals   06/17/17 1024  BP: 110/64  Pulse: 88  Weight: 271 lb 6.4 oz (123.1 kg)  Height: 1.676 m (5\' 6" )  PainSc: 0-No pain    Encounter Medications:   Outpatient Encounter Prescriptions as of 06/17/2017  Medication Sig Note  . ALPRAZolam (XANAX) 0.5 MG tablet Take 0.5 mg by mouth every 6 (six) hours as needed for anxiety or sleep.    Marland Kitchen aspirin EC  81 MG tablet Take 81 mg by mouth daily.   Marland Kitchen atorvastatin (LIPITOR) 20 MG tablet Take 20 mg by mouth daily at 6 PM.    . BAYER CONTOUR NEXT TEST test strip    . Biotin (BIOTIN 5000) 5 MG CAPS Take 5 mg by mouth at bedtime.   . cyanocobalamin (,VITAMIN B-12,) 1000 MCG/ML injection Inject 1 mL (1,000 mcg total) into the muscle every 30 (thirty) days. Start with 1 ml IM daily x 7 days then 1 ml IM weekly x 4 weeks then 1 ml IM monthly x 1 year.   . cyclobenzaprine (FLEXERIL) 10 MG tablet Take 10 mg by mouth at bedtime as needed for muscle spasms.    Marland Kitchen dexlansoprazole (DEXILANT) 60 MG capsule Take 60 mg by mouth every morning. 0600   . furosemide (LASIX) 40 MG tablet Take 40 mg by mouth 2 (two) times daily.     Marland Kitchen gabapentin (NEURONTIN) 300 MG capsule Take 2 capsules (600 mg total) by mouth 3 (three) times daily.   Marland Kitchen HYDROcodone-acetaminophen (NORCO) 7.5-325 MG tablet Take 1 tablet by mouth as needed for pain.   . hyoscyamine (LEVSIN SL) 0.125 MG SL tablet Place 0.125 mg under the tongue every 4 (four) hours as needed for cramping. 06/17/2017: Takes in the morning and at 5 pm  . insulin regular human CONCENTRATED (HUMULIN R) 500 UNIT/ML SOLN injection Inject 2 Units into the skin. 2.0 units /hour 0500-2359, 1.5units 5462-7035  12/24/2012: Insulin pump Patient  is aware of doses and carbs  . INVOKAMET 50-1000 MG TABS Take 1 tablet by mouth 2 (two) times daily.    Marland Kitchen levothyroxine (SYNTHROID, LEVOTHROID) 125 MCG tablet Take 250 mcg by mouth daily before breakfast. 250 mcg daily except 125 mcg on one day per week   . lisinopril (PRINIVIL,ZESTRIL) 40 MG tablet Take 40 mg by mouth 2 (two) times daily.    . metoprolol (TOPROL-XL) 50 MG 24 hr tablet Take 100 mg by mouth 2 (two) times daily.    . metroNIDAZOLE (METROCREAM) 0.75 % cream Apply 1 application topically 2 (two) times daily.   . Multiple Vitamins-Minerals (MULTIVITAMIN PO) Take 2 tablets by mouth at bedtime. gummies   . Nutritional Supplements (VITAMIN D  BOOSTER PO) Take 50,000 Units by mouth daily.   . ondansetron (ZOFRAN-ODT) 8 MG disintegrating tablet Take 8 mg by mouth every 6 (six) hours as needed for nausea or vomiting.    Marland Kitchen acetaminophen (TYLENOL) 500 MG tablet Take 1,000 mg by mouth every 6 (six) hours as needed for mild pain.   Marland Kitchen ammonium lactate (LAC-HYDRIN) 12 % lotion Apply 1 application topically as needed for dry skin.   . Dermatological Products, Misc. (HYLATOPIC PLUS) CREA Apply 1 application topically 4 (four) times daily.   . fluticasone (FLONASE) 50 MCG/ACT nasal spray Place 1 spray into the nose daily. Reported on 12/27/2015   . halobetasol (ULTRAVATE) 0.05 % cream Apply 1 application topically 2 (two) times daily.   Marland Kitchen ibuprofen (ADVIL,MOTRIN) 200 MG tablet Take 600-800 mg by mouth every 8 (eight) hours as needed for moderate pain.   Marland Kitchen Oxymetazoline HCl (RHOFADE) 1 % CREA Apply 1 application topically as needed (for skin irritation).   Marland Kitchen PROCTOSOL HC 2.5 % rectal cream Apply 1 application topically daily as needed for hemorrhoids.    . triamcinolone ointment (KENALOG) 0.1 % Apply 1 application topically as needed (for irritation).    No facility-administered encounter medications on file as of 06/17/2017.     Functional Status:   In your present state of health, do you have any difficulty performing the following activities: 06/17/2017  Hearing? N  Vision? N  Difficulty concentrating or making decisions? N  Walking or climbing stairs? Y  Dressing or bathing? Y  Doing errands, shopping? Y  Using the Toilet? N  In the past six months, have you accidently leaked urine? N  Do you have problems with loss of bowel control? N  Managing your Medications? N  Managing your Finances? N  Housekeeping or managing your Housekeeping? N  Some recent data might be hidden    Fall/Depression Screening:    Fall Risk  06/17/2017 01/25/2017 02/23/2016  Falls in the past year? Yes Yes Yes  Number falls in past yr: 2 or more 1 2 or more   Injury with Fall? Yes No Yes  Risk Factor Category  High Fall Risk - High Fall Risk  Risk for fall due to : Impaired balance/gait Impaired mobility Other (Comment);Impaired mobility  Risk for fall due to (comments): - - ankle rolls  Follow up Education provided;Falls prevention discussed Falls evaluation completed;Education provided;Falls prevention discussed -   PHQ 2/9 Scores 06/17/2017 02/23/2016  PHQ - 2 Score 0 2  PHQ- 9 Score - 14    Assessment:   employee now on long term disability due to chronic pain and polyneuropathy, also with Type II DM, HTN, and hyperlipidemia and obesity. Meeting treatment targets for HTN only. Most recent Hgb A1C= 7.8% on  04/02/17, lipid profile of 12/15/16 showed elevated triglycerides at 317.  Plan:  [facility] CM Care Plan Problem One     Most Recent Value  Care Plan Problem One Type II DM - on insulin pump with U 500 insulin not meeting Hgb A1C target as evidenced by Hgb A1C= 7.8% on 04/02/17, hyperlipidemia with elevated triglycerides of 317 on 12/15/16, HTN meeting treatment  targets of <140/<90.  Role Documenting the Problem One  Care Management Coordinator  Care Plan for Problem One  Active  [facility] Long Term Goal  Improved glycemic management as evidenced by Hgb A1C < 7.0% and 75% of self monitored CBGs meeting target, ongoing good control of HTN as evidenced by average BP readings <140/<90, improved hyperlipidemia as evidenced by normal lipid profile at next assessment, evidence of weight loss or no weight gain at next assessment   [facility] Long Term Goal Start Date  06/17/17  Interventions for Problem One Long Term Goal spent the majority of office visit allowing patient to discuss her health problems in detail and current treatment plan and provided emotional support, reviewed CBG readings and Hgb A1C results, discussed pump settings and frequency of hypoglycemia and hyperglycemia and treatment, assessed hypoglycemic threshold and usual action plan to treat,  discussed option of upgrading to 670 G insulin delivery system when current pump warranty expires but patient does not like to wear sensors consistently so 670 G not appropriate, reviewed upcoming appointments with providers, will arrange for Link To Wellness follow up in 4-6 months     RNCM to fax today's office visit note to Dr. Forde Dandy. RNCM will meet as needed with patient per Link To Wellness program guidelines to assist with Type II DM, HTN, hyperlipidemia and obesity self-management and assess patient's progress toward mutually set goals. Barrington Ellison RN,CCM,CDE [place] Management Coordinator Link To Wellness and [employer] [phone] Office Fax [phone]

## 2017-06-19 ENCOUNTER — Ambulatory Visit: Payer: 59 | Admitting: Podiatry

## 2017-06-19 DIAGNOSIS — F329 Major depressive disorder, single episode, unspecified: Secondary | ICD-10-CM | POA: Diagnosis not present

## 2017-06-20 ENCOUNTER — Ambulatory Visit (INDEPENDENT_AMBULATORY_CARE_PROVIDER_SITE_OTHER): Payer: 59 | Admitting: Podiatry

## 2017-06-20 DIAGNOSIS — L97521 Non-pressure chronic ulcer of other part of left foot limited to breakdown of skin: Secondary | ICD-10-CM

## 2017-06-20 DIAGNOSIS — L03116 Cellulitis of left lower limb: Secondary | ICD-10-CM

## 2017-06-20 MED ORDER — DOXYCYCLINE HYCLATE 100 MG PO TABS: 100.0000 mg | ORAL_TABLET | Freq: Two times a day (BID) | ORAL | 0 refills | Status: DC

## 2017-06-20 NOTE — Progress Notes (Signed)
Patient ID: [name], female   DOB: [birth year], 51 y.o.   MRN: [id number]  Subjective: 51 year old female presents today for evaluation of wound on the bottom of the left hallux. She states that she is doing better she is in better spirits today and she figured out her insurance issues. She states that the wound to the bottom of the left great toe is getting better and she denies any drainage or pus. She states the area of the wound that she had her foot is getting much better but still gets sore at times. She denies any swelling or redness or any drainage from this area. She has no new concerns today. Denies any systemic complaints such as fevers, chills, nausea, vomiting. No acute changes since last appointment, and no other complaints at this time.   Objective: AAO x3, NAD DP/PT pulses palpable bilaterally, CRT less than 3 seconds On the plantar aspect of the hallux is a hyperkeratotic lesion with a skin fissure just inferior to this area. Upon debridement the skin fissure area appears to be improved and there is no open sores to this area. Just distal to this is a hyperkeratotic lesion and upon debridement there is a pre-ulcerative lesion but the area is not open at this point. Is no drainage or pus there is no swelling edema, erythema, ascending cellulitis. There is no fluxions or crepitus or any malodor. On the dorsal medial aspect of left foot along the area of the previous wound with her brace was rubbing his areas almost completely healed at this point there is no signs of infection. There is mild erythema to the distal portion of the anterior left leg with minimal warmth to the area. This may be an early cellulitis. There is no fluxions or crepitus there's no malodor. No other open lesions or pre-ulcerative lesions. No pain with calf compression, swelling, warmth, erythema  Assessment: Wound left hallux, improved; cellulitis left leg  Plan: -All treatment options discussed  with the patient including all alternatives, risks, complications.  -Left hallux is sharply debrided without any Occasions or bleeding. Recommend continued antibiotic ointment dressing changes daily. -Prescribed doxycycline for possible cellulitis the left leg. Monitor for any worsening the ER should any occur. -RTC 2 weeks or sooner if needed.  Celesta Gentile, DPM

## 2017-06-24 DIAGNOSIS — F329 Major depressive disorder, single episode, unspecified: Secondary | ICD-10-CM | POA: Diagnosis not present

## 2017-07-03 DIAGNOSIS — K7581 Nonalcoholic steatohepatitis (NASH): Secondary | ICD-10-CM | POA: Diagnosis not present

## 2017-07-03 DIAGNOSIS — E1149 Type 2 diabetes mellitus with other diabetic neurological complication: Secondary | ICD-10-CM | POA: Diagnosis not present

## 2017-07-03 DIAGNOSIS — E538 Deficiency of other specified B group vitamins: Secondary | ICD-10-CM | POA: Diagnosis not present

## 2017-07-03 DIAGNOSIS — E890 Postprocedural hypothyroidism: Secondary | ICD-10-CM | POA: Diagnosis not present

## 2017-07-03 DIAGNOSIS — E559 Vitamin D deficiency, unspecified: Secondary | ICD-10-CM | POA: Diagnosis not present

## 2017-07-03 DIAGNOSIS — E784 Other hyperlipidemia: Secondary | ICD-10-CM | POA: Diagnosis not present

## 2017-07-03 DIAGNOSIS — E11621 Type 2 diabetes mellitus with foot ulcer: Secondary | ICD-10-CM | POA: Diagnosis not present

## 2017-07-03 DIAGNOSIS — Z1389 Encounter for screening for other disorder: Secondary | ICD-10-CM | POA: Diagnosis not present

## 2017-07-03 DIAGNOSIS — N08 Glomerular disorders in diseases classified elsewhere: Secondary | ICD-10-CM | POA: Diagnosis not present

## 2017-07-03 DIAGNOSIS — C73 Malignant neoplasm of thyroid gland: Secondary | ICD-10-CM | POA: Diagnosis not present

## 2017-07-05 ENCOUNTER — Ambulatory Visit (INDEPENDENT_AMBULATORY_CARE_PROVIDER_SITE_OTHER): Payer: 59 | Admitting: Podiatry

## 2017-07-05 ENCOUNTER — Encounter: Payer: Self-pay | Admitting: Podiatry

## 2017-07-05 VITALS — BP 97/64 | HR 87 | Resp 16

## 2017-07-05 DIAGNOSIS — L97521 Non-pressure chronic ulcer of other part of left foot limited to breakdown of skin: Secondary | ICD-10-CM | POA: Diagnosis not present

## 2017-07-05 DIAGNOSIS — R29898 Other symptoms and signs involving the musculoskeletal system: Secondary | ICD-10-CM

## 2017-07-05 DIAGNOSIS — E1149 Type 2 diabetes mellitus with other diabetic neurological complication: Secondary | ICD-10-CM

## 2017-07-05 DIAGNOSIS — E1140 Type 2 diabetes mellitus with diabetic neuropathy, unspecified: Secondary | ICD-10-CM | POA: Diagnosis not present

## 2017-07-05 DIAGNOSIS — L03116 Cellulitis of left lower limb: Secondary | ICD-10-CM | POA: Diagnosis not present

## 2017-07-05 DIAGNOSIS — M21372 Foot drop, left foot: Secondary | ICD-10-CM | POA: Diagnosis not present

## 2017-07-05 DIAGNOSIS — E108 Type 1 diabetes mellitus with unspecified complications: Secondary | ICD-10-CM | POA: Diagnosis not present

## 2017-07-05 DIAGNOSIS — M6281 Muscle weakness (generalized): Secondary | ICD-10-CM

## 2017-07-05 DIAGNOSIS — M779 Enthesopathy, unspecified: Secondary | ICD-10-CM | POA: Diagnosis not present

## 2017-07-07 NOTE — Progress Notes (Signed)
Subjective: [name] presents today for follow-up evaluation of wound to left hallux as well as saline-soaked left leg. She states that her husband says it looks much better. The redness has resolved left leg and she states the wound is almost healed. Denies any drainage or pus coming from the area and denies any swelling. She is also hopeful that her brace has arrived today. Denies any systemic complaints such as fevers, chills, nausea, vomiting. No acute changes since last appointment, and no other complaints at this time.   Objective: AAO x3, NAD DP/PT pulses palpable bilaterally, CRT less than 3 seconds There is no edema, erythema, increased warmth there is no evidence of cellulitis. There is no wounds on the leg. On the plantar aspect of left hallux is a hyperkeratotic lesion along the area of the wound. Upon debridement the wound almost is completely healed and there is almost a superficial abrasion type lesion present. There is no surrounding erythema, ascending cellulitis. There is no fluctuance, crepitus, malodor. Otherwise her exam to lower extremity is unchanged in regards to musculoskeletal exam. There is no other open lesions or pre-ulcerative lesions. There is no pain with calf compression, swelling, warmth, erythema.  Assessment: Resolved cellulitis left leg with healing wound plantar hallux  Plan: -Treatment options discussed including all alternatives, risks, and complications -Etiology of symptoms were discussed -Cellulitis appears to be resolved. Monitor for any recurrence. -Sharply debrided the wound to the plantar aspect of the left hallux. It is almost healed a superficial abrasion type appearance. Continue small amount of antibiotic ointment dressing changes daily. -Today she was fitted for her brace with Rick. After trying it on she states that the brace was fitting well and that she felt that she could walk better with this and it helps keep her foot up and her ankle from rolling.  Discussed manner gradually get used to this and monitor for any further skin breakdown. -Follow-up as scheduled or sooner if needed.  Celesta Gentile, DPM

## 2017-07-08 ENCOUNTER — Other Ambulatory Visit: Payer: 59 | Admitting: Orthotics

## 2017-07-08 DIAGNOSIS — F3289 Other specified depressive episodes: Secondary | ICD-10-CM | POA: Diagnosis not present

## 2017-07-10 DIAGNOSIS — Z01419 Encounter for gynecological examination (general) (routine) without abnormal findings: Secondary | ICD-10-CM | POA: Diagnosis not present

## 2017-07-10 DIAGNOSIS — Z1231 Encounter for screening mammogram for malignant neoplasm of breast: Secondary | ICD-10-CM | POA: Diagnosis not present

## 2017-07-15 DIAGNOSIS — E669 Obesity, unspecified: Secondary | ICD-10-CM | POA: Diagnosis not present

## 2017-07-15 DIAGNOSIS — R809 Proteinuria, unspecified: Secondary | ICD-10-CM | POA: Diagnosis not present

## 2017-07-15 DIAGNOSIS — E039 Hypothyroidism, unspecified: Secondary | ICD-10-CM | POA: Diagnosis not present

## 2017-07-15 DIAGNOSIS — M797 Fibromyalgia: Secondary | ICD-10-CM | POA: Diagnosis not present

## 2017-07-15 DIAGNOSIS — N181 Chronic kidney disease, stage 1: Secondary | ICD-10-CM | POA: Diagnosis not present

## 2017-07-15 DIAGNOSIS — E785 Hyperlipidemia, unspecified: Secondary | ICD-10-CM | POA: Diagnosis not present

## 2017-07-15 DIAGNOSIS — E118 Type 2 diabetes mellitus with unspecified complications: Secondary | ICD-10-CM | POA: Diagnosis not present

## 2017-07-15 DIAGNOSIS — I129 Hypertensive chronic kidney disease with stage 1 through stage 4 chronic kidney disease, or unspecified chronic kidney disease: Secondary | ICD-10-CM | POA: Diagnosis not present

## 2017-07-15 DIAGNOSIS — E079 Disorder of thyroid, unspecified: Secondary | ICD-10-CM | POA: Diagnosis not present

## 2017-07-16 DIAGNOSIS — F329 Major depressive disorder, single episode, unspecified: Secondary | ICD-10-CM | POA: Diagnosis not present

## 2017-07-19 DIAGNOSIS — Z1231 Encounter for screening mammogram for malignant neoplasm of breast: Secondary | ICD-10-CM | POA: Diagnosis not present

## 2017-07-23 DIAGNOSIS — F329 Major depressive disorder, single episode, unspecified: Secondary | ICD-10-CM | POA: Diagnosis not present

## 2017-07-25 ENCOUNTER — Other Ambulatory Visit: Payer: 59 | Admitting: Orthotics

## 2017-07-29 DIAGNOSIS — F3289 Other specified depressive episodes: Secondary | ICD-10-CM | POA: Diagnosis not present

## 2017-07-30 ENCOUNTER — Ambulatory Visit: Payer: 59 | Admitting: Orthotics

## 2017-07-30 DIAGNOSIS — E1140 Type 2 diabetes mellitus with diabetic neuropathy, unspecified: Secondary | ICD-10-CM

## 2017-07-30 DIAGNOSIS — E1149 Type 2 diabetes mellitus with other diabetic neurological complication: Secondary | ICD-10-CM

## 2017-07-30 DIAGNOSIS — L84 Corns and callosities: Secondary | ICD-10-CM

## 2017-07-30 NOTE — Progress Notes (Signed)
[name] came in today frustrated that her brace was not fitting as well as she like and there were some indications that the brace, as is, could be causing some skin irritation.   She is also concerned that the brace doesn't "sit well" in the heel.   I am returning the brace to [place] for modifications after consultation with Orthotist/Fabricater at [facility] to figure out best possible outcome.

## 2017-08-06 DIAGNOSIS — F329 Major depressive disorder, single episode, unspecified: Secondary | ICD-10-CM | POA: Diagnosis not present

## 2017-08-08 ENCOUNTER — Telehealth: Payer: Self-pay | Admitting: Neurology

## 2017-08-08 DIAGNOSIS — Z794 Long term (current) use of insulin: Secondary | ICD-10-CM | POA: Diagnosis not present

## 2017-08-08 DIAGNOSIS — H25811 Combined forms of age-related cataract, right eye: Secondary | ICD-10-CM | POA: Diagnosis not present

## 2017-08-08 DIAGNOSIS — E119 Type 2 diabetes mellitus without complications: Secondary | ICD-10-CM | POA: Diagnosis not present

## 2017-08-08 DIAGNOSIS — H35373 Puckering of macula, bilateral: Secondary | ICD-10-CM | POA: Diagnosis not present

## 2017-08-08 NOTE — Telephone Encounter (Signed)
Called Becca back and left message for her to call me back.

## 2017-08-08 NOTE — Telephone Encounter (Signed)
[facility] called regarding this patient. Please Call. Thanks

## 2017-08-14 DIAGNOSIS — F3289 Other specified depressive episodes: Secondary | ICD-10-CM | POA: Diagnosis not present

## 2017-08-16 ENCOUNTER — Encounter: Payer: Self-pay | Admitting: Podiatry

## 2017-08-16 ENCOUNTER — Ambulatory Visit (INDEPENDENT_AMBULATORY_CARE_PROVIDER_SITE_OTHER): Payer: 59 | Admitting: Podiatry

## 2017-08-16 DIAGNOSIS — E1140 Type 2 diabetes mellitus with diabetic neuropathy, unspecified: Secondary | ICD-10-CM

## 2017-08-16 DIAGNOSIS — L97521 Non-pressure chronic ulcer of other part of left foot limited to breakdown of skin: Secondary | ICD-10-CM

## 2017-08-16 DIAGNOSIS — E1149 Type 2 diabetes mellitus with other diabetic neurological complication: Secondary | ICD-10-CM

## 2017-08-16 DIAGNOSIS — M21372 Foot drop, left foot: Secondary | ICD-10-CM | POA: Diagnosis not present

## 2017-08-19 DIAGNOSIS — F329 Major depressive disorder, single episode, unspecified: Secondary | ICD-10-CM | POA: Diagnosis not present

## 2017-08-19 NOTE — Progress Notes (Signed)
Subjective: [name] presents today for follow-up evaluation of wound to left hallux.  She has has not had any redness or drainage coming from the wound she denies any redness or red streaking. She states that she is giving frustrated with her ankle. She states that when she was starting to wear the brace that was helping however was rubbing constant sores on her foot and she has been in discussion with [name] in regards to this. She also states that she fell the other day causing a wound deformity, of her left third toe. She has real is that she is falling walking into the house as there is a ledge on the staff that she trips on an since she can't bring her foot up as well she trips on this. Denies any systemic complaints such as fevers, chills, nausea, vomiting. No acute changes since last appointment, and no other complaints at this time.   Objective: AAO x3, NAD DP/PT pulses palpable bilaterally, CRT less than 3 seconds On the plantar aspect of the left hallux is wound this hyperkeratotic periwound. After removing the wound measures 0.5 x 0.5 x 0.1 cm. There is no probing to bone, undermining or tunneling. There is no surrounding erythema, ascending cellulitis. There is no fluctuance or crepitus noted no malodor. On the dorsal aspect of the left third toe is a superficial abrasion. There is no sign of infection present. Overall exam to her muscle strength and ankle exam is unchanged. No other open lesions or pre-ulcerative lesions. There is no pain with calf compression, swelling, warmth, erythema.  Assessment: Resolved cellulitis left leg with healing wound plantar hallux  Plan: -Treatment options discussed including all alternatives, risks, and complications -Wound was sharply debrided without any complications. Recommend a small amount of antibiotic ointment dressing changes daily. Continue offloading. Continue this for the 3rd toe as well.  -Awaiting her brace revision. Discussed that not to wear  sandals or go barefoot which she has been doing. She is also been start using her walker to help prevent falls and she has a ramp to get in the house. -She is getting frustrated with her ankle brace but she is very thankful of the care I have been provided. -Monitor for any clinical signs or symptoms of infection and directed to call the office immediately should any occur or go to the ER. -RTC 2 weeks or sooner if needed.  We spent 20 min in face to face time today talking about the wound, ankle issues, bracing.   Celesta Gentile, DPM

## 2017-08-29 ENCOUNTER — Ambulatory Visit: Payer: 59 | Admitting: Podiatry

## 2017-08-30 ENCOUNTER — Encounter: Payer: Self-pay | Admitting: Neurology

## 2017-08-30 ENCOUNTER — Ambulatory Visit (INDEPENDENT_AMBULATORY_CARE_PROVIDER_SITE_OTHER): Payer: 59 | Admitting: Neurology

## 2017-08-30 VITALS — BP 100/70 | HR 89 | Ht 66.0 in | Wt 268.2 lb

## 2017-08-30 DIAGNOSIS — R292 Abnormal reflex: Secondary | ICD-10-CM

## 2017-08-30 DIAGNOSIS — G629 Polyneuropathy, unspecified: Secondary | ICD-10-CM

## 2017-08-30 DIAGNOSIS — R29898 Other symptoms and signs involving the musculoskeletal system: Secondary | ICD-10-CM

## 2017-08-30 DIAGNOSIS — G8220 Paraplegia, unspecified: Secondary | ICD-10-CM

## 2017-08-30 DIAGNOSIS — G8314 Monoplegia of lower limb affecting left nondominant side: Secondary | ICD-10-CM

## 2017-08-30 MED ORDER — DIAZEPAM 5 MG PO TABS: 5.0000 mg | ORAL_TABLET | Freq: Once | ORAL | 0 refills | Status: AC

## 2017-08-30 MED ORDER — GABAPENTIN 100 MG PO CAPS: 100.0000 mg | ORAL_CAPSULE | Freq: Three times a day (TID) | ORAL | 5 refills | Status: DC

## 2017-08-30 NOTE — Progress Notes (Signed)
Follow-up Visit   Date: 08/30/17    [name] MRN: [id number] DOB: [birth year]   Interim History: [name] is a 51 y.o. right-handed Caucasian female with insulin-dependent diabetes mellitus, thyroid cancer s/p resection secondary hypothyroidism (2016), hypertension, hyperlipidemia, GERD, s/p left L5 hemilaminectomy and discectomy, chronic left ankle pain returning for a follow-up of peripheral neuropathy affecting the hands and feet.  History of present illness: In 2012, she developed severe radicular left leg and foot pain and foot drop and was found to have left L5-S1 herniated disc and spinal stenosis at this this level.  She has left L5 hemilaminectomy and discectomy by Dr. Arnoldo Morale.  Pain was significantly improved post-op and over the next few months, she was able to extend her toes and foot.  She has residual reduced sensation of the left great toe.    Starting in 2016, she had a spell of night sweats and burning sensation of the body.  She also has SOB and tachycardia and was later found to have thyroid cancer.  She also was treated with radioactive iodine later because her margins were not clean.  By June 2016, she was feeling very fatigued and thought it was due to deconditioning so started walking, but then developed left knee pain.  She attributed this to being overweight and sedentary.  She continued to walk and later left hip pain and left foot pain. Ankle pain was deep and throbbing. She was recommended to immobilize the foot due to possible stress fracture.  She saw Dr. Doran Durand, orthopaedics, who recommended that she wear a cast.  She recalls it being better while wearing a cast, but never completely resolved. Once the cast was removed, she realized that she had weakness of the foot with eversion, inversion, and dorsiflexion.  Weight bearing exacerbates her pain, so she often stands only on her right leg.  She has failed extensive treatment with PT,  bracing, pain meds and immobilization and underwent left ankle surgery with ligament reconstruction and tendonlysis for instability and tendonitis in April 2017.  Unfortuantely, this did not improve her pain and she was referred to pain management for complex regional pain syndrome, however they did not feel that she had this.  She did physical therapy and was able to improve left dorsiflexion some.  Because of ongoing deep, throbbing pain of the left ankle, she was referred for NCS/EMG to be sure nerve etiology was not missed.   Her NCS/EMG showed acute on chronic sensorimotor polyneuropathy affecting both legs, worse on the right.  When asked if she has numbness/tingling/burning pain, she specifically denies this.  She is very surprised that her testing shows abnormalities in her "good leg".    Around summer 2017, she started having new low back pain.  Pain is sharp and localized to her low back and radiates into her left buttocks, but does not radiate into her leg.  She saw Dr. Arnoldo Morale again and MRI lumbar spine which showed small L3-4 disc protrusion near the left L4 nerve root.  Post-op changes at left L5-S1 was stable.   UPDATE 02/28/2017:  Patient came to the clinic today because of severe low back pain which started following her blood patch last Friday, 3/16.  She unfortunately developed a post-LP headache which did not improve with caffeine and hydration and had great response to blood patch, however she developed acute onset of low back pain immediately following the procedure.  She felt a tight sensation in her lower  back as if there is pressure or a "fist" in her low back.  She has sharp shooting pain going down her back and into her buttocks.  She took Vicodin and flexeril but did not have any benefit.  She continues to have leg weakness, but does not feel this is worse than before.  She denies any bowel/bladder incontinence.  UPDATE 04/26/2017:  She is here for follow-up appointment.  Since her  last visit, she has noticed new burning and tingling sensation of the hands and is more aware of this.  Her lower leg paresthesias and left hip flexion weakness is unchanged.  She continues to have low back pain ranked as 4/10 and tried physical therapy without any benefit.  She currently takes flexeril at bedtime for this.  She has not had any new falls and walks with a cane as needed.  She is getting fit for a left AFO.  UPDATE 08/30/2017:   She is here for follow-up visit. At her last visit, I offered additional work-up with MRI brain, cervical spine, genetic testing, and offered option for an academic center.  After thinking about options, she is not interested in genetic testing or academic referral.  She was told that genetic testing would cost $3500 and she does not have children, so prefers not to pursue this.    Since her last visit, she has had 9 falls, three of which were syncopal episodes.  She was evaluated by Dr. Caryl Comes for syncope and diagnosed with dysautonomia. She is compliant with abdominal binder.  She continues to have worsening weakness of the hands and legs and is worried about her functional recovery.  She also tells me that she will be loosing her insurance at the end of October and applying for social security benefits.    Past Medical History:  Diagnosis Date  . Anginal pain ([place])    chest pain  heart cath done  . Cancer ([place])   . Chronic bronchitis ([place])    "get it q yr"  . Fibromyalgia   . GERD (gastroesophageal reflux disease)   . Hepatic adenoma   . High cholesterol   . History of gout   . History of hiatal hernia   . Hypertension   . Hypothyroidism   . Iron deficiency anemia    "comes and goes"  . Kidney stones   . Migraine    "@ least once/month" (02/18/2015)  . PONV (postoperative nausea and vomiting)   . Proteinuria   . Tachycardia   . Type II diabetes mellitus ([facility])     Family History  Problem Relation Age of Onset  . Hypertension Mother   .  Emphysema Mother   . AAA (abdominal aortic aneurysm) Mother   . Hypertension Father   . Coronary artery disease Father   . Cancer - Prostate Father   . Renal Disease Father   . Hypertension Brother    Social History  Substance Use Topics  . Smoking status: Never Smoker  . Smokeless tobacco: Never Used  . Alcohol use No    Medications:  Current Outpatient Prescriptions on File Prior to Visit  Medication Sig Dispense Refill  . acetaminophen (TYLENOL) 500 MG tablet Take 1,000 mg by mouth every 6 (six) hours as needed for mild pain.    Marland Kitchen ALPRAZolam (XANAX) 0.5 MG tablet Take 0.5 mg by mouth every 6 (six) hours as needed for anxiety or sleep.     Marland Kitchen ammonium lactate (LAC-HYDRIN) 12 % lotion Apply 1 application  topically as needed for dry skin.    Marland Kitchen aspirin EC 81 MG tablet Take 81 mg by mouth daily.    Marland Kitchen atorvastatin (LIPITOR) 20 MG tablet Take 20 mg by mouth daily at 6 PM.     . BAYER CONTOUR NEXT TEST test strip   4  . Biotin (BIOTIN 5000) 5 MG CAPS Take 5 mg by mouth at bedtime.    . cyanocobalamin (,VITAMIN B-12,) 1000 MCG/ML injection Inject 1 mL (1,000 mcg total) into the muscle every 30 (thirty) days. Start with 1 ml IM daily x 7 days then 1 ml IM weekly x 4 weeks then 1 ml IM monthly x 1 year. 25 mL 0  . cyclobenzaprine (FLEXERIL) 10 MG tablet Take 10 mg by mouth at bedtime as needed for muscle spasms.     . Dermatological Products, Misc. (HYLATOPIC PLUS) CREA Apply 1 application topically 4 (four) times daily.    Marland Kitchen dexlansoprazole (DEXILANT) 60 MG capsule Take 60 mg by mouth every morning. 0600    . fluticasone (FLONASE) 50 MCG/ACT nasal spray Place 1 spray into the nose daily. Reported on 12/27/2015    . furosemide (LASIX) 40 MG tablet Take 40 mg by mouth 2 (two) times daily.      Marland Kitchen gabapentin (NEURONTIN) 300 MG capsule Take 2 capsules (600 mg total) by mouth 3 (three) times daily. 540 capsule 3  . halobetasol (ULTRAVATE) 0.05 % cream Apply 1 application topically 2 (two) times  daily.    Marland Kitchen HYDROcodone-acetaminophen (NORCO) 7.5-325 MG tablet Take 1 tablet by mouth as needed for pain.    . hyoscyamine (LEVSIN SL) 0.125 MG SL tablet Place 0.125 mg under the tongue every 4 (four) hours as needed for cramping.    Marland Kitchen ibuprofen (ADVIL,MOTRIN) 200 MG tablet Take 600-800 mg by mouth every 8 (eight) hours as needed for moderate pain.    Marland Kitchen insulin regular human CONCENTRATED (HUMULIN R) 500 UNIT/ML SOLN injection Inject 2 Units into the skin. 2.0 units /hour 0500-2359, 1.5units 7588-3254     . INVOKAMET 50-1000 MG TABS Take 1 tablet by mouth 2 (two) times daily.   5  . levothyroxine (SYNTHROID, LEVOTHROID) 125 MCG tablet Take 250 mcg by mouth daily before breakfast. 250 mcg daily except 125 mcg on one day per week    . lisinopril (PRINIVIL,ZESTRIL) 40 MG tablet Take 40 mg by mouth 2 (two) times daily.     . metoprolol (TOPROL-XL) 50 MG 24 hr tablet Take 100 mg by mouth 2 (two) times daily.     . metroNIDAZOLE (METROCREAM) 0.75 % cream Apply 1 application topically 2 (two) times daily.    . Multiple Vitamins-Minerals (MULTIVITAMIN PO) Take 2 tablets by mouth at bedtime. gummies    . Nutritional Supplements (VITAMIN D BOOSTER PO) Take 50,000 Units by mouth daily.    . ondansetron (ZOFRAN-ODT) 8 MG disintegrating tablet Take 8 mg by mouth every 6 (six) hours as needed for nausea or vomiting.     . Oxymetazoline HCl (RHOFADE) 1 % CREA Apply 1 application topically as needed (for skin irritation).    Marland Kitchen PROCTOSOL HC 2.5 % rectal cream Apply 1 application topically daily as needed for hemorrhoids.   2  . triamcinolone ointment (KENALOG) 0.1 % Apply 1 application topically as needed (for irritation).     No current facility-administered medications on file prior to visit.     Allergies:  Allergies  Allergen Reactions  . Penicillins Anaphylaxis, Shortness Of Breath, Swelling and Palpitations  Has patient had a PCN reaction causing immediate rash, facial/tongue/throat swelling, SOB or  lightheadedness with hypotension: Yes Has patient had a PCN reaction causing severe rash involving mucus membranes or skin necrosis: No Has patient had a PCN reaction that required hospitalization No Has patient had a PCN reaction occurring within the last 10 years: No If all of the above answers are "NO", then may proceed with Cephalosporin use.   . Clarithromycin Nausea And Vomiting    Any antibiotic (doxyclycine, etc)  . Dilaudid [Hydromorphone Hcl] Nausea And Vomiting  . Adhesive [Tape] Itching    Redness  . Latex Itching and Rash    Blisters with prolonged contact  . Milk-Related Compounds Other (See Comments)    Patient preference, does not like  . Moxifloxacin Nausea And Vomiting    Needs Zofran or Phenergan    Review of Systems:  CONSTITUTIONAL: No fevers, chills, night sweats, or weight loss.  EYES: No visual changes or eye pain ENT: No hearing changes.  No history of nose bleeds.   RESPIRATORY: No cough, wheezing and shortness of breath.   CARDIOVASCULAR: Negative for chest pain, and palpitations.   GI: Negative for abdominal discomfort, blood in stools or black stools.  No recent change in bowel habits.   GU:  No history of incontinence.   MUSCLOSKELETAL: No history of joint pain or swelling.  +myalgias.  +weakness SKIN: Negative for lesions, rash, and itching.   ENDOCRINE: Negative for cold or heat intolerance, polydipsia or goiter.   PSYCH:  No  depression or anxiety symptoms.   NEURO: As Above.   Vital Signs:  BP 100/70   Pulse 89   Ht '5\' 6"'  (1.676 m)   Wt 268 lb 3 oz (121.6 kg)   SpO2 97%   BMI 43.29 kg/m   Neurological Exam: MENTAL STATUS including orientation to time, place, person, recent and remote memory, attention span and concentration, language, and fund of knowledge is normal.  Speech is not dysarthric.  CRANIAL NERVES:  Pupils are round and reactive to light.  Extraocular muscles are intact.  Face is symmetric.  Palate elevates symmetrically.    MOTOR: Tone is normal.    Right Upper Extremity:    Left Upper Extremity:    Deltoid  4+/5   Deltoid  4+/5   Biceps  4+/5   Biceps  4+/5   Triceps  4+/5   Triceps  4+/5   Wrist extensors  5-/5   Wrist extensors  5-/5   Wrist flexors  4/5   Wrist flexors  4/5   Finger extensors  4-/5   Finger extensors  4-/5   Finger flexors  4/5   Finger flexors  4/5   Dorsal interossei  4-/5   Dorsal interossei  4-/5   Abductor pollicis  4/5   Abductor pollicis  4/5   Tone (Ashworth scale)  0  Tone (Ashworth scale)  0   Right Lower Extremity:    Left Lower Extremity:    Hip flexors  4/5   Hip flexors  4-/5   Hip extensors  5/5   Hip extensors  5/5   Knee flexors  5/5   Knee flexors  4/5   Knee extensors          5-/5 Inversion 5/5  inversion 2/5  Eversion 5/5  Eversion 2/5  Dorsiflexors  5/5   Dorsiflexors  4/5   Plantarflexors  5/5   Plantarflexors  4/5   Toe extensors  5-/5   Toe  extensors  1/5   Toe flexors  5-/5   Toe flexors  1/5   Tone (Ashworth scale)  0  Tone (Ashworth scale)  0    MSRs:    Right                                                                 Left brachioradialis 2+  brachioradialis 2+  biceps 2+  biceps 2+  triceps 2+  triceps 2+  patellar 3+  patellar 3+  ankle jerk 0  ankle jerk 0  Hoffman no  Hoffman no  plantar response down  plantar response down   SENSORY:  Vibration 50% at the knees bilaterally, absent at the ankles; Temperature is reduced over the hands and below the knees.   COORDINATION/GAIT:  Gait appears very unsteady with cane, slow, and waddling  Data: NCS/EMG of the arms 01/29/2017: The electrophysiologic testing is most consistent with an active on chronic, distal and symmetric sensorimotor polyneuropathy, demyelinating and axon loss in type. Overall, these findings are severe in degree electrically.   In addition, the presence of temporal dispersion suggests an acquired condition.  NCS/EMG of the legs  01/10/2017:    The electrophysiologic findings are most consistent with a subacute sensorimotor polyneuropathy, axon loss in type, affecting the lower extremities and worse on the left. Overall, these findings are severe in degree electrically.  MRI lumbar spine 05/09/2016: 1. Postoperative changes on the left at L5-S1 with no adverse features. 2. New small L3-L4 disc protrusion into the left lateral recess at the level of the descending left L4 nerve roots. 3. L4-L5 disc and posterior element degeneration appear stable since 2012 with mild bilateral foraminal stenosis.  Labs 01/25/2017:  ESR 10, CRP 0.3, vitamin B12 256, ACE 5, MMA 231, ANA neg, SSA/B neg, copper 92, vitamin B1 6*, SPEP with IFE no M protein  CSF 02/19/2017:  R1200, W0 G76 P37  ACE 10, IgG index 0.47, Lyme neg, cytology neg, no OCB  MRI lumbar spine wwo contrast 02/28/2017: 1. Stable appearance of the lumbar spine. No acute abnormality identified. 2. Postoperative changes on the left L5-S1 without residual stenosis. 3. Small left subarticular disc protrusion at L3-4 encroaching upon the left lateral recess, and potentially affecting the descending left L4 nerve root. 4. Mild bilateral foraminal stenosis at L4-5 related to disc bulge and facet degeneration.   IMPRESSION/PLAN: 1.  Subacute sensorimotor polyneuropathy affecting glove-stocking distribution, worse on the left. Because of her active denervation and severity of neuropathy, CSF analysis was obtained which returned normal.  Serology testing looking for autoimmune disease was also negative.  She was found to have vitamin B12 and B1 deficiency, which has seen been repleted, but she continues to have progressive weakness.  Her diabetes is very well-controlled and would not explain the severity of her neuropathy.  Therefore, I will check antibodies specific for autoimmune neuropathy (anti-MAG, anti-sulfatide, anti-Hu, etc - sensorimotor neuropathy panel through [facility]).  I will  also set her up for a muscle (left vastus lateralis) and left sural nerve biopsy to look for infiltrative disease.   For her painful paresthesias, she can reduce gabapentin to 480m TID due to sedation at 603mTID.  With the severity of her neuropathy causing motor and sensory loss, I do  agree that she would be unable to resume meaningful work.   2.  Left hip flexion weakness - asymmetrical proximal left leg weakness ? left L4 nerve impingement. No improvement with physical therapy.  She denies any severe back pain at this time.  We may seek the opinion of Dr. Arnoldo Morale as to whether there is enough impingement from her lumbar spine to cause her weakness, by imaging alone this is mild.  With her asymmetric leg weakness and hyperreflexia, I would recommend MRI brain and cervical spine, even though my clinical suspicion for CNS pathology is low.   3.  Chronic low back pain.  Continue flexeril 83m at bedtime.  Return to clinic in 3 months   Greater than 50% of this 40 minute visit was spent in counseling, explanation of diagnosis, planning of further management, and coordination of care due to complexity of her condition and many questions that were answered.    Thank you for allowing me to participate in patient's care.  If I can answer any additional questions, I would be pleased to do so.    Sincerely,    Xiomar Crompton K. PPosey Pronto DO

## 2017-08-30 NOTE — Patient Instructions (Addendum)
1.  Start using a walker  2.  Adjust your gabapentin with 100mg  tablets to limit your sedation  3.  Please call [phone] to discuss cost of testing SensoriMotor Neuropathy Profile with [facility] (Test Code 2440)  4.  MRI brain and cervical spine without contrast  5.  We will arrange muscle and nerve biopsy  Return to clinic 3 months

## 2017-09-02 ENCOUNTER — Telehealth: Payer: Self-pay | Admitting: Neurology

## 2017-09-02 ENCOUNTER — Other Ambulatory Visit: Payer: Self-pay | Admitting: *Deleted

## 2017-09-02 DIAGNOSIS — G629 Polyneuropathy, unspecified: Secondary | ICD-10-CM

## 2017-09-02 DIAGNOSIS — G8220 Paraplegia, unspecified: Secondary | ICD-10-CM

## 2017-09-02 DIAGNOSIS — R292 Abnormal reflex: Secondary | ICD-10-CM

## 2017-09-02 DIAGNOSIS — G8314 Monoplegia of lower limb affecting left nondominant side: Secondary | ICD-10-CM

## 2017-09-02 DIAGNOSIS — R29898 Other symptoms and signs involving the musculoskeletal system: Secondary | ICD-10-CM

## 2017-09-02 NOTE — Progress Notes (Signed)
Referrals sent

## 2017-09-02 NOTE — Telephone Encounter (Signed)
[name] called needing to speak with you regarding some things that were discussed at the last visit. Please call. She had some information for you. Please Advise. Thanks

## 2017-09-03 ENCOUNTER — Encounter: Payer: Self-pay | Admitting: Podiatry

## 2017-09-03 ENCOUNTER — Ambulatory Visit (INDEPENDENT_AMBULATORY_CARE_PROVIDER_SITE_OTHER): Payer: 59 | Admitting: Podiatry

## 2017-09-03 DIAGNOSIS — L97521 Non-pressure chronic ulcer of other part of left foot limited to breakdown of skin: Secondary | ICD-10-CM | POA: Diagnosis not present

## 2017-09-03 DIAGNOSIS — E1140 Type 2 diabetes mellitus with diabetic neuropathy, unspecified: Secondary | ICD-10-CM

## 2017-09-03 DIAGNOSIS — M21372 Foot drop, left foot: Secondary | ICD-10-CM | POA: Diagnosis not present

## 2017-09-03 DIAGNOSIS — E1149 Type 2 diabetes mellitus with other diabetic neurological complication: Secondary | ICD-10-CM | POA: Diagnosis not present

## 2017-09-03 DIAGNOSIS — S91209A Unspecified open wound of unspecified toe(s) with damage to nail, initial encounter: Secondary | ICD-10-CM

## 2017-09-03 NOTE — Telephone Encounter (Signed)
Left message for patient to call me back. 

## 2017-09-04 NOTE — Progress Notes (Signed)
Subjective: Mahealani presents today for follow-up evaluation of wound to left hallux. She states the wound is doing better. She denies any drainage or pus coming from the area. She also presents a pickup her corrected [place] brace. She does state that she fell yesterday tripping over her cat and she did rip off the big toenail the right side as well as a small little scab area is present along the medial aspect of the right ankle. Denies any drainage or pus or any issues to this area. Has no other concerns.  Since last appointment she has followed up with neurology. She is going to getting MRI of her head and neck.  Denies any systemic complaints such as fevers, chills, nausea, vomiting. No acute changes since last appointment, and no other complaints at this time.   Objective: AAO x3, NAD DP/PT pulses palpable bilaterally, CRT less than 3 seconds On the plantar aspect of the left hallux is wound this hyperkeratotic periwound. After debridement the wound measures 0.5 x 0.3 x 0.1 cm. There is no probing to bone, undermining or tunneling. There is no surrounding erythema, ascending cellulitis. There is no fluctuance or crepitus noted no malodor.  The abrasion lesions the dorsal aspect left third toe is healed.  No nails present to the right hallux toenail there is no leading or open sore that there is a small area of the scab. Very small superficial abrasion type lesion to the medial aspect of the right ankle. There is no drainage possibilities lesions there is no edema, erythema or ascending cellulitis.  No other open lesions or pre-ulcerative lesions. There is no pain with calf compression, swelling, warmth, erythema.  Assessment: Chronic plantar left hallux ulceration with chronic avulsion right hallux toenail and right medial ankle wound ; dropfoot ankle instability with neuropathy   Plan: -Treatment options discussed including all alternatives, risks, and complications -Wound was sharply debrided  without any complications. Recommend a small amount of antibiotic ointment dressing changes daily. Continue offloading. Wound appears be improving and no signs of infection -Continue in about ointment along the right hallux as well as the superficial wound. -Brace was again dispensed today. Discussed break in instructions. She was seen by [name] for this. -We again spent about 20 minutes in face-to-face discussion in regards to her treatment options in regards the neuropathy in her frustrations. Ultimately recommended continue with neurology evaluation MRI. She kept thanking me for what I have done for her and she wanted to talk about her treatment options that she is facing right now. I did not persuade her one way or other about her treatment options going to discuss.  Celesta Gentile, DPM

## 2017-09-05 ENCOUNTER — Other Ambulatory Visit: Payer: Self-pay | Admitting: *Deleted

## 2017-09-05 DIAGNOSIS — R945 Abnormal results of liver function studies: Secondary | ICD-10-CM | POA: Diagnosis not present

## 2017-09-05 MED ORDER — DIAZEPAM 5 MG PO TABS: 5.0000 mg | ORAL_TABLET | Freq: Once | ORAL | 0 refills | Status: AC

## 2017-09-09 ENCOUNTER — Ambulatory Visit (HOSPITAL_COMMUNITY)
Admission: RE | Admit: 2017-09-09 | Discharge: 2017-09-09 | Disposition: A | Discharge status: Home / Self Care | Payer: 59 | Source: Ambulatory Visit | Attending: Neurology | Admitting: Neurology

## 2017-09-09 ENCOUNTER — Ambulatory Visit (HOSPITAL_COMMUNITY): Payer: 59

## 2017-09-09 DIAGNOSIS — M50221 Other cervical disc displacement at C4-C5 level: Secondary | ICD-10-CM | POA: Diagnosis not present

## 2017-09-09 DIAGNOSIS — R531 Weakness: Secondary | ICD-10-CM | POA: Diagnosis not present

## 2017-09-09 DIAGNOSIS — G8220 Paraplegia, unspecified: Secondary | ICD-10-CM | POA: Diagnosis not present

## 2017-09-09 DIAGNOSIS — G8314 Monoplegia of lower limb affecting left nondominant side: Secondary | ICD-10-CM | POA: Insufficient documentation

## 2017-09-09 DIAGNOSIS — M4802 Spinal stenosis, cervical region: Secondary | ICD-10-CM | POA: Diagnosis not present

## 2017-09-09 DIAGNOSIS — R292 Abnormal reflex: Secondary | ICD-10-CM | POA: Insufficient documentation

## 2017-09-09 LAB — CREATININE, SERUM
Creatinine, Ser: 0.92 mg/dL (ref 0.44–1.00)
GFR calc Af Amer: >60 mL/min (ref >60)
GFR calc non Af Amer: >60 mL/min (ref >60)

## 2017-09-09 MED ORDER — GADOBENATE DIMEGLUMINE 529 MG/ML IV SOLN
20.0000 mL | Freq: Once | INTRAVENOUS | Status: AC | PRN
  Administered 2017-09-09: 20 mL via INTRAVENOUS

## 2017-09-11 ENCOUNTER — Telehealth: Payer: Self-pay | Admitting: *Deleted

## 2017-09-11 ENCOUNTER — Encounter: Payer: Self-pay | Admitting: *Deleted

## 2017-09-11 NOTE — Telephone Encounter (Signed)
Patient given results via MyChart     

## 2017-09-11 NOTE — Telephone Encounter (Signed)
-----   Message from Alda Berthold, DO sent at 09/11/2017 11:55 AM EDT ----- Please inform patient that her MRI brain looks great and is normal. There is some age-related changes in the cervical spine region with disc bulge at few levels, but there is no nerve impingement that would explain her left leg weakness and hand weakness.

## 2017-09-17 DIAGNOSIS — K760 Fatty (change of) liver, not elsewhere classified: Secondary | ICD-10-CM | POA: Diagnosis not present

## 2017-09-17 DIAGNOSIS — R945 Abnormal results of liver function studies: Secondary | ICD-10-CM | POA: Diagnosis not present

## 2017-09-18 ENCOUNTER — Other Ambulatory Visit: Payer: 59

## 2017-09-18 DIAGNOSIS — Z23 Encounter for immunization: Secondary | ICD-10-CM | POA: Diagnosis not present

## 2017-09-18 DIAGNOSIS — N08 Glomerular disorders in diseases classified elsewhere: Secondary | ICD-10-CM | POA: Diagnosis not present

## 2017-09-18 DIAGNOSIS — K7581 Nonalcoholic steatohepatitis (NASH): Secondary | ICD-10-CM | POA: Diagnosis not present

## 2017-09-18 DIAGNOSIS — G894 Chronic pain syndrome: Secondary | ICD-10-CM | POA: Diagnosis not present

## 2017-09-18 DIAGNOSIS — E1129 Type 2 diabetes mellitus with other diabetic kidney complication: Secondary | ICD-10-CM | POA: Diagnosis not present

## 2017-09-18 DIAGNOSIS — E7849 Other hyperlipidemia: Secondary | ICD-10-CM | POA: Diagnosis not present

## 2017-09-18 DIAGNOSIS — G379 Demyelinating disease of central nervous system, unspecified: Secondary | ICD-10-CM | POA: Diagnosis not present

## 2017-09-18 DIAGNOSIS — R269 Unspecified abnormalities of gait and mobility: Secondary | ICD-10-CM | POA: Diagnosis not present

## 2017-09-18 DIAGNOSIS — E1142 Type 2 diabetes mellitus with diabetic polyneuropathy: Secondary | ICD-10-CM | POA: Diagnosis not present

## 2017-09-18 DIAGNOSIS — F3289 Other specified depressive episodes: Secondary | ICD-10-CM | POA: Diagnosis not present

## 2017-09-20 ENCOUNTER — Telehealth: Payer: Self-pay | Admitting: Neurology

## 2017-09-20 NOTE — Telephone Encounter (Signed)
Received note from [facility]. Patient has an appt scheduled on 09/23/17 with Dr. Arnoldo Morale and is aware.

## 2017-09-23 DIAGNOSIS — Z6841 Body Mass Index (BMI) 40.0 and over, adult: Secondary | ICD-10-CM | POA: Diagnosis not present

## 2017-09-23 DIAGNOSIS — G6181 Chronic inflammatory demyelinating polyneuritis: Secondary | ICD-10-CM | POA: Diagnosis not present

## 2017-09-23 DIAGNOSIS — I10 Essential (primary) hypertension: Secondary | ICD-10-CM | POA: Diagnosis not present

## 2017-09-24 ENCOUNTER — Other Ambulatory Visit: Payer: Self-pay | Admitting: Neurosurgery

## 2017-09-26 ENCOUNTER — Encounter (HOSPITAL_COMMUNITY)
Admission: RE | Admit: 2017-09-26 | Discharge: 2017-09-26 | Disposition: A | Discharge status: Home / Self Care | Payer: 59 | Source: Ambulatory Visit | Attending: Neurosurgery | Admitting: Neurosurgery

## 2017-09-26 ENCOUNTER — Encounter (HOSPITAL_COMMUNITY): Payer: Self-pay

## 2017-09-26 ENCOUNTER — Encounter: Payer: Self-pay | Admitting: Podiatry

## 2017-09-26 ENCOUNTER — Ambulatory Visit (INDEPENDENT_AMBULATORY_CARE_PROVIDER_SITE_OTHER): Payer: 59 | Admitting: Podiatry

## 2017-09-26 VITALS — BP 112/64 | HR 82 | Resp 16

## 2017-09-26 DIAGNOSIS — I2510 Atherosclerotic heart disease of native coronary artery without angina pectoris: Secondary | ICD-10-CM | POA: Insufficient documentation

## 2017-09-26 DIAGNOSIS — Z85850 Personal history of malignant neoplasm of thyroid: Secondary | ICD-10-CM | POA: Diagnosis not present

## 2017-09-26 DIAGNOSIS — Z01812 Encounter for preprocedural laboratory examination: Secondary | ICD-10-CM | POA: Diagnosis not present

## 2017-09-26 DIAGNOSIS — E890 Postprocedural hypothyroidism: Secondary | ICD-10-CM | POA: Insufficient documentation

## 2017-09-26 DIAGNOSIS — Z9889 Other specified postprocedural states: Secondary | ICD-10-CM | POA: Diagnosis not present

## 2017-09-26 DIAGNOSIS — L97521 Non-pressure chronic ulcer of other part of left foot limited to breakdown of skin: Secondary | ICD-10-CM

## 2017-09-26 DIAGNOSIS — E1149 Type 2 diabetes mellitus with other diabetic neurological complication: Secondary | ICD-10-CM | POA: Diagnosis not present

## 2017-09-26 DIAGNOSIS — E1142 Type 2 diabetes mellitus with diabetic polyneuropathy: Secondary | ICD-10-CM | POA: Diagnosis not present

## 2017-09-26 DIAGNOSIS — I120 Hypertensive chronic kidney disease with stage 5 chronic kidney disease or end stage renal disease: Secondary | ICD-10-CM | POA: Insufficient documentation

## 2017-09-26 DIAGNOSIS — G6181 Chronic inflammatory demyelinating polyneuritis: Secondary | ICD-10-CM | POA: Diagnosis not present

## 2017-09-26 DIAGNOSIS — M797 Fibromyalgia: Secondary | ICD-10-CM | POA: Diagnosis not present

## 2017-09-26 DIAGNOSIS — E1140 Type 2 diabetes mellitus with diabetic neuropathy, unspecified: Secondary | ICD-10-CM

## 2017-09-26 DIAGNOSIS — K219 Gastro-esophageal reflux disease without esophagitis: Secondary | ICD-10-CM | POA: Diagnosis not present

## 2017-09-26 DIAGNOSIS — N182 Chronic kidney disease, stage 2 (mild): Secondary | ICD-10-CM | POA: Insufficient documentation

## 2017-09-26 HISTORY — DX: Other reaction to spinal and lumbar puncture: G97.1

## 2017-09-26 HISTORY — DX: Presence of spectacles and contact lenses: Z97.3

## 2017-09-26 HISTORY — DX: Obesity, unspecified: E66.9

## 2017-09-26 HISTORY — DX: Chronic inflammatory demyelinating polyneuritis: G61.81

## 2017-09-26 HISTORY — DX: Irritable bowel syndrome, unspecified: K58.9

## 2017-09-26 HISTORY — DX: Anxiety disorder, unspecified: F41.9

## 2017-09-26 HISTORY — DX: Syncope and collapse: R55

## 2017-09-26 HISTORY — DX: Unspecified cataract: H26.9

## 2017-09-26 LAB — COMPREHENSIVE METABOLIC PANEL
ALT: 71 U/L — ABNORMAL HIGH (ref 14–54)
AST: 105 U/L — ABNORMAL HIGH (ref 15–41)
Albumin: 4.1 g/dL (ref 3.5–5.0)
Alkaline Phosphatase: 83 U/L (ref 38–126)
Anion gap: 14 (ref 5–15)
BUN: 13 mg/dL (ref 6–20)
CO2: 23 mmol/L (ref 22–32)
Calcium: 9.4 mg/dL (ref 8.9–10.3)
Chloride: 100 mmol/L — ABNORMAL LOW (ref 101–111)
Creatinine, Ser: 0.99 mg/dL (ref 0.44–1.00)
GFR calc Af Amer: >60 mL/min (ref >60)
GFR calc non Af Amer: >60 mL/min (ref >60)
Glucose, Bld: 159 mg/dL — ABNORMAL HIGH (ref 65–99)
Potassium: 4.2 mmol/L (ref 3.5–5.1)
Sodium: 137 mmol/L (ref 135–145)
Total Bilirubin: 0.5 mg/dL (ref 0.3–1.2)
Total Protein: 6.9 g/dL (ref 6.5–8.1)

## 2017-09-26 LAB — CBC
HCT: 41.8 % (ref 36.0–46.0)
Hemoglobin: 13.4 g/dL (ref 12.0–15.0)
MCH: 30.3 pg (ref 26.0–34.0)
MCHC: 32.1 g/dL (ref 30.0–36.0)
MCV: 94.6 fL (ref 78.0–100.0)
Platelets: 193 10*3/uL (ref 150–400)
RBC: 4.42 MIL/uL (ref 3.87–5.11)
RDW: 13.9 % (ref 11.5–15.5)
WBC: 5.9 10*3/uL (ref 4.0–10.5)

## 2017-09-26 LAB — GLUCOSE, CAPILLARY: Glucose-Capillary: 205 mg/dL — ABNORMAL HIGH (ref 65–99)

## 2017-09-26 LAB — HEMOGLOBIN A1C
Hgb A1c MFr Bld: 7.7 % — ABNORMAL HIGH (ref 4.8–5.6)
Mean Plasma Glucose: 174.29 mg/dL

## 2017-09-26 NOTE — Pre-Procedure Instructions (Signed)
[name]  09/26/2017      [place], [place] - [address] [facility]. [address] [place] [address] Phone: [phone] Fax: [phone]  [id number] - [place], [place] [address] [place] [address] Phone: [phone] Fax: [phone]    Your procedure is scheduled on Monday, September 30, 2017  Report to [facility] Admitting at 12:10 P.M.  Call this number if you have problems the morning of surgery:  [phone]   Remember: Follow Endocrinologist instructions regarding insulin pump OR reduce all basal rates by 20% at midnight  Do not eat food or drink liquids after midnight Sunday, September 29, 2017  Take these medicines the morning of surgery with A SIP OF WATER : dexlansoprazole (DEXILANT),gabapentin (NEURONTIN), levothyroxine (SYNTHROID),   metoprolol succinate (TOPROL-XL), dicyclomine (BENTYL), if needed:cetirizine (ZYRTEC) for allergies, fluticasone (FLONASE)  nasal spray for allergies, ondansetron (ZOFRAN) for nausea or vomiting, HYDROcodone-acetaminophen (NORCO) tablet for pain Stop taking Aspirin, vitamins, fish oil, Biotin, and herbal medications. Do not take any NSAIDs ie: Ibuprofen, Advil, Naproxen (Aleve), Motrin or any medication containing Aspirin; stop now.    How to Manage Your Diabetes Before and After Surgery  Why is it important to control my blood sugar before and after surgery? . Improving blood sugar levels before and after surgery helps healing and can limit problems. . A way of improving blood sugar control is eating a healthy diet by: o  Eating less sugar and carbohydrates o  Increasing activity/exercise o  Talking with your doctor about reaching your blood sugar goals . High blood sugars (greater than 180 mg/dL) can raise your risk of infections and slow your recovery, so you will need to focus on  controlling your diabetes during the weeks before surgery. . Make sure that the doctor who takes care of your diabetes knows about your planned surgery including the date and location.  How do I manage my blood sugar before surgery? . Check your blood sugar at least 4 times a day, starting 2 days before surgery, to make sure that the level is not too high or low. o Check your blood sugar the morning of your surgery when you wake up and every 2 hours until you get to the Short Stay unit. . If your blood sugar is less than 70 mg/dL, you will need to treat for low blood sugar: o Do not take insulin. o Treat a low blood sugar (less than 70 mg/dL) with  cup of clear juice (cranberry or apple), 4 glucose tablets, OR glucose gel. o Recheck blood sugar in 15 minutes after treatment (to make sure it is greater than 70 mg/dL). If your blood sugar is not greater than 70 mg/dL on recheck, call [phone] for further instructions. . Report your blood sugar to the short stay nurse when you get to Short Stay.  . If you are admitted to the hospital after surgery: o Your blood sugar will be checked by the staff and you will probably be given insulin after surgery (instead of oral diabetes medicines) to make sure you have good blood sugar levels. o The goal for blood sugar control after surgery is 80-180 mg/dL  WHAT DO I DO ABOUT MY DIABETES MEDICATION? Marland Kitchen Do not take oral diabetes medicines (pills) the morning of surgery such as INVOKAMET  Reviewed and Endorsed by [facility] Patient Education Committee, August 2015  Do not wear jewelry, make-up or nail polish.  Do not wear lotions, powders, or perfumes, or deoderant.  Do not shave 48 hours prior to surgery.    Do not bring valuables to the hospital.  [facility] is not responsible for any belongings or valuables.  Contacts, dentures or bridgework may not be worn into surgery.  Leave your suitcase in the car.  After surgery it may be brought to your  room.  For patients admitted to the hospital, discharge time will be determined by your treatment team.  Patients discharged the day of surgery will not be allowed to drive home.   Special instructions: Shower the night before surgery and the morning of surgery with CHG.  Please read over the following fact sheets that you were given. Pain Booklet, Coughing and Deep Breathing and Surgical Site Infection Prevention

## 2017-09-26 NOTE — Progress Notes (Signed)
Pt denies SOB and chest pain. Pt under the care of Dr. Caryl Comes, Cardiology. A1c requested from [facility] as requested (add on). Pt stated that a cardiac cath was performed in 2010 by Dr. Lia Foyer and was "clean." Pt stated that a stress test was performed > 10 years ago. Pt denies having a chest x ray within the last year. Pt denies recent labs. Spoke with Herby Abraham, Diabetes Coordinator regarding U-500 insulin pump. Pt chart forwarded to anesthesia to review EKG and abnormal labs (AST and ALT).

## 2017-09-27 MED ORDER — VANCOMYCIN HCL 10 G IV SOLR
1500.0000 mg | INTRAVENOUS | Status: AC
  Administered 2017-09-30 (×2): 1500 mg via INTRAVENOUS
  Filled 2017-09-27: qty 1500

## 2017-09-27 NOTE — Progress Notes (Signed)
Anesthesia Chart Review: Patient is a 51 year old female scheduled for left sural nerve and muscle biopsy on 09/30/17 by Dr. Newman Pies.   History includes never smoker, post-operative N/V, HTN, GERD, hiatal hernia, fibromyalgia, DM2 (U500 insulin pump), iron deficiency anemia, tachycardia, hepatic adenoma, hepatic steatosis, CKD stage II, chronic inflammatory demyelinating polyneuropathy (?), IBS, anxiety, syncope, chest pain '10 (no significant CAD by [facility] 04/20/09), migraines, papillary tyroid cancer s/p total thyroidectomy 3/11/6 with post-surgical hypothyroidism, hypercholesterolemia, hysterectomy, spinal headache, left ankle ligament reconstruction 03/29/16, cholecystectomy '05, L5-S1 discectomy 08/22/11.    - PCP is Dr. Reynold Bowen (Endocrinology).  - Cardiologist is Dr. Virl Axe. Last visit 05/21/17 for follow-up recurrent syncope with hypertension and orthostatic hypotension in the context of diabetes and likely some degree of neuropathy. She could not do sodium loading due to systolic hypertension, so compressive clothing recommended. She was doing much better with an abdominal binder. Syncope felt likely vasomotor.   - Neurologist is Dr. Narda Amber, last visit 08/30/17 for peripheral neuropathy follow-up. MRI brain/c-spine was ordered due to progressive weakness (see results below), but Dr. Posey Pronto did not see any nerve impingement that would explain her left leg/hand weakness. Muscle biopsy recommended to look for infiltrative disease.  - Nephrologist is Dr. Jeneen Rinks Deterding. Seen for CKD stage 2 on 07/15/17.  Medications include insulin pump (U500), Xanax, Lipitor, Zyrtec, Flexeril, Dexilant, Bentyl, doxycycline, Flonase, Lasix, Neurontin, Norco, Levsin SL, Invokamet, levothyroxine, lisinopril, Toprol-XL.  BP 117/68   Pulse 83   Temp 36.5 C   Resp 18   Ht 5' 6" (1.676 m)   Wt 277 lb (125.6 kg)   SpO2 98%   BMI 44.71 kg/m   EKG 01/18/17: NSR, cannot rule anterior infarct (age  undetermined). Overall, I think tracing is similar to 03/26/16.  Event Monitor 01/23/17-02/19/17: High average heart rate 1 40 bpm. Low average heart rate 90 bpm. 20 manually triggered recordings post with symptoms including chest pain, rapid heart rate/palpitations/fluttering, dizziness/lightheadedness, other. No pauses noted for 3 seconds or longer. For this report ECG findings include sinus tachycardia, sinus rhythm, PVCs. Symptoms            Chest pain>> sinus 90-110            Flutters >> sinus tach 120            LH/dizziness>> sinus tach 100-140 // isolated PVC Dr. Caryl Comes felt all were expected findings.  Echo 01/23/17: Study Conclusions - Left ventricle: The cavity size was normal. Wall thickness was   increased in a pattern of mild LVH. Systolic function was   vigorous. The estimated ejection fraction was in the range of 65%   to 70%. Wall motion was normal; there were no regional wall   motion abnormalities. Doppler parameters are consistent with   abnormal left ventricular relaxation (grade 1 diastolic   dysfunction).  Cardiac cath 04/20/2009: Conclusions; 1.Preserved overall left ventricular systolic function with minimally elevated left ventricular end-diastolic pressure. 2. No significant areas of high-grade focal coronary obstruction (minimal irregularity at the proximal mid junction small ramus intermedius).  MRI brain/c-spine 09/09/17: IMPRESSION: MRI HEAD IMPRESSION: Normal MRI of the brain. No acute intracranial abnormality identified. MRI CERVICAL SPINE IMPRESSION: 1. Normal MRI appearance of the cervical spinal cord. No cord signal abnormality is identified. 2. Multilobulated disc protrusion at C4-5, flattening the right hemi cord without cord signal changes. 3. Broad posterior disc bulge at C6-7 with resultant mild canal and left C7 foraminal stenosis. 4. Central disc protrusion at  C3-4 with resultant mild spinal stenosis. No significant cord  deformity.  According to 08/30/17 neurology records, additional polyneuropathy work-up has included: NCS/EMG of the arms 01/29/2017: The electrophysiologic testing is most consistent with an active on chronic, distal and symmetric sensorimotor polyneuropathy, demyelinating and axon loss in type. Overall, these findings are severe in degree electrically. In addition, the presence of temporal dispersion suggests an acquired condition.  NCS/EMG of the legs 01/10/2017: The electrophysiologic findings are most consistent with a subacute sensorimotor polyneuropathy, axon loss in type, affecting the lower extremities and worse on the left. Overall, these findings are severe in degree electrically.  MRI lumbar spine 05/09/2016: 1. Postoperative changes on the left at L5-S1 with no adverse features. 2. New small L3-L4 disc protrusion into the left lateral recess at the level of the descending left L4 nerve roots. 3. L4-L5 disc and posterior element degeneration appear stable since 2012 with mild bilateral foraminal stenosis.  Labs 01/25/2017:  ESR 10, CRP 0.3, vitamin B12 256, ACE 5, MMA 231, ANA neg, SSA/B neg, copper 92, vitamin B1 6*, SPEP with IFE no M protein  CSF 02/19/2017:  R1200, W0 G76 P37  ACE 10, IgG index 0.47, Lyme neg, cytology neg, no OCB  MRI lumbar spine wwo contrast 02/28/2017: 1. Stable appearance of the lumbar spine. No acute abnormality identified. 2. Postoperative changes on the left L5-S1 without residual stenosis. 3. Small left subarticular disc protrusion at L3-4 encroaching upon the left lateral recess, and potentially affecting the descending left L4 nerve root. 4. Mild bilateral foraminal stenosis at L4-5 related to disc bulge and facet degeneration.  Preoperative labs noted. Cr 0.99. AST 105, ALT 71. CBC WNL. A1c 7.7. There are comparison LFTs from 01/15/17 scanned under the Media tab that showed AT 88 and ALT 76. She also had an abdominal CT on 12/06/16 to evaluate for  "elevated liver function tests" and it showed "Diffuse hepatic steatosis again noted."  I will route labs to Dr. Forde Dandy for him to have on file.  If no acute changes then I would anticipate that she can proceed as planned.    George Hugh Sparrow [facility] Short Stay Center/Anesthesiology Phone [phone] 09/27/2017 11:49 AM

## 2017-09-29 NOTE — Progress Notes (Signed)
Subjective: [name] presents today for follow-up evaluation of wound to left hallux. She presents with her husband. She states that the wound with open or get better and I'll get worse. Denies any drainage or pus and denies any redness or swelling. Denies any red streaks. She does state that the wound to left third toe on the dorsal aspect is getting better. She did follow up with pathology to get recasted for the brace. She is frustrated because the brace been helping however she did not get inside of her shoe. Denies any systemic complaints such as fevers, chills, nausea, vomiting. No acute changes since last appointment, and no other complaints at this time.   Objective: AAO x3, NAD DP/PT pulses palpable bilaterally, CRT less than 3 seconds On the plantar aspect of the left hallux is wound this hyperkeratotic periwound. After debridement the wound measures 0.4 x 0.3 x 0.1 cm. There is no probing to bone, undermining or tunneling. There is no surrounding erythema, ascending cellulitis. There is no fluctuance or crepitus noted no malodor.  There is a superficial abrasion type wound to the dorsal left third toe. This is small and there is no drainage or pus any signs of infection. No other open lesions or pre-ulcerative lesions. There is no pain with calf compression, swelling, warmth, erythema.  Assessment: Chronic plantar left hallux ulceration without signs of infection, superficial abrasion left third toe without signs of infection  Plan: -Treatment options discussed including all alternatives, risks, and complications -Wound to the plantar left hallux is sharply debrided to the area complications or bleeding. Continue Intermedic ointment dressing changes to the wounds daily. Continue offloading all times. -We discussed the brace as well as today. -We also discussed for about 25 minutes in face-to-face discussion with her and her husband in regards to her treatment for her foot as well as her  overall general medical conditions. At the have a conversation recommended continue to follow up with neurology and she has an upcoming appointment with neurosurgery, Dr. Ronnald Ramp as well.  Celesta Gentile, DPM

## 2017-09-30 ENCOUNTER — Ambulatory Visit (HOSPITAL_COMMUNITY)
Admission: RE | Admit: 2017-09-30 | Discharge: 2017-09-30 | Disposition: A | Discharge status: Home / Self Care | Payer: 59 | Source: Ambulatory Visit | Attending: Neurosurgery | Admitting: Neurosurgery

## 2017-09-30 ENCOUNTER — Ambulatory Visit (HOSPITAL_COMMUNITY): Payer: 59 | Admitting: Anesthesiology

## 2017-09-30 ENCOUNTER — Ambulatory Visit (HOSPITAL_COMMUNITY): Payer: 59 | Admitting: Vascular Surgery

## 2017-09-30 ENCOUNTER — Encounter (HOSPITAL_COMMUNITY): Payer: Self-pay

## 2017-09-30 ENCOUNTER — Encounter (HOSPITAL_COMMUNITY)
Admission: RE | Disposition: A | Discharge status: Home / Self Care | Payer: Self-pay | Source: Ambulatory Visit | Attending: Neurosurgery

## 2017-09-30 DIAGNOSIS — I10 Essential (primary) hypertension: Secondary | ICD-10-CM | POA: Diagnosis not present

## 2017-09-30 DIAGNOSIS — E1136 Type 2 diabetes mellitus with diabetic cataract: Secondary | ICD-10-CM | POA: Insufficient documentation

## 2017-09-30 DIAGNOSIS — E039 Hypothyroidism, unspecified: Secondary | ICD-10-CM | POA: Insufficient documentation

## 2017-09-30 DIAGNOSIS — Z794 Long term (current) use of insulin: Secondary | ICD-10-CM | POA: Insufficient documentation

## 2017-09-30 DIAGNOSIS — Z87442 Personal history of urinary calculi: Secondary | ICD-10-CM | POA: Diagnosis not present

## 2017-09-30 DIAGNOSIS — Z79890 Hormone replacement therapy (postmenopausal): Secondary | ICD-10-CM | POA: Insufficient documentation

## 2017-09-30 DIAGNOSIS — Z8249 Family history of ischemic heart disease and other diseases of the circulatory system: Secondary | ICD-10-CM | POA: Insufficient documentation

## 2017-09-30 DIAGNOSIS — Z6841 Body Mass Index (BMI) 40.0 and over, adult: Secondary | ICD-10-CM | POA: Insufficient documentation

## 2017-09-30 DIAGNOSIS — Z881 Allergy status to other antibiotic agents status: Secondary | ICD-10-CM | POA: Insufficient documentation

## 2017-09-30 DIAGNOSIS — Z841 Family history of disorders of kidney and ureter: Secondary | ICD-10-CM | POA: Insufficient documentation

## 2017-09-30 DIAGNOSIS — K449 Diaphragmatic hernia without obstruction or gangrene: Secondary | ICD-10-CM | POA: Diagnosis not present

## 2017-09-30 DIAGNOSIS — Z91011 Allergy to milk products: Secondary | ICD-10-CM | POA: Diagnosis not present

## 2017-09-30 DIAGNOSIS — M797 Fibromyalgia: Secondary | ICD-10-CM | POA: Insufficient documentation

## 2017-09-30 DIAGNOSIS — E1142 Type 2 diabetes mellitus with diabetic polyneuropathy: Secondary | ICD-10-CM | POA: Diagnosis not present

## 2017-09-30 DIAGNOSIS — Z836 Family history of other diseases of the respiratory system: Secondary | ICD-10-CM | POA: Insufficient documentation

## 2017-09-30 DIAGNOSIS — K589 Irritable bowel syndrome without diarrhea: Secondary | ICD-10-CM | POA: Insufficient documentation

## 2017-09-30 DIAGNOSIS — F419 Anxiety disorder, unspecified: Secondary | ICD-10-CM | POA: Insufficient documentation

## 2017-09-30 DIAGNOSIS — L97529 Non-pressure chronic ulcer of other part of left foot with unspecified severity: Secondary | ICD-10-CM | POA: Diagnosis not present

## 2017-09-30 DIAGNOSIS — G6181 Chronic inflammatory demyelinating polyneuritis: Secondary | ICD-10-CM | POA: Diagnosis not present

## 2017-09-30 DIAGNOSIS — Z91040 Latex allergy status: Secondary | ICD-10-CM | POA: Insufficient documentation

## 2017-09-30 DIAGNOSIS — E7800 Pure hypercholesterolemia, unspecified: Secondary | ICD-10-CM | POA: Insufficient documentation

## 2017-09-30 DIAGNOSIS — Z90710 Acquired absence of both cervix and uterus: Secondary | ICD-10-CM | POA: Insufficient documentation

## 2017-09-30 DIAGNOSIS — Z9889 Other specified postprocedural states: Secondary | ICD-10-CM | POA: Insufficient documentation

## 2017-09-30 DIAGNOSIS — F418 Other specified anxiety disorders: Secondary | ICD-10-CM | POA: Diagnosis not present

## 2017-09-30 DIAGNOSIS — Z9049 Acquired absence of other specified parts of digestive tract: Secondary | ICD-10-CM | POA: Insufficient documentation

## 2017-09-30 DIAGNOSIS — J42 Unspecified chronic bronchitis: Secondary | ICD-10-CM | POA: Insufficient documentation

## 2017-09-30 DIAGNOSIS — Z8042 Family history of malignant neoplasm of prostate: Secondary | ICD-10-CM | POA: Insufficient documentation

## 2017-09-30 DIAGNOSIS — Z885 Allergy status to narcotic agent status: Secondary | ICD-10-CM | POA: Diagnosis not present

## 2017-09-30 DIAGNOSIS — K219 Gastro-esophageal reflux disease without esophagitis: Secondary | ICD-10-CM | POA: Diagnosis not present

## 2017-09-30 DIAGNOSIS — G629 Polyneuropathy, unspecified: Secondary | ICD-10-CM | POA: Diagnosis not present

## 2017-09-30 DIAGNOSIS — Z79899 Other long term (current) drug therapy: Secondary | ICD-10-CM | POA: Insufficient documentation

## 2017-09-30 DIAGNOSIS — Z9641 Presence of insulin pump (external) (internal): Secondary | ICD-10-CM | POA: Insufficient documentation

## 2017-09-30 DIAGNOSIS — E11621 Type 2 diabetes mellitus with foot ulcer: Secondary | ICD-10-CM | POA: Diagnosis not present

## 2017-09-30 DIAGNOSIS — Z880 Allergy status to penicillin: Secondary | ICD-10-CM | POA: Insufficient documentation

## 2017-09-30 DIAGNOSIS — Z91048 Other nonmedicinal substance allergy status: Secondary | ICD-10-CM | POA: Insufficient documentation

## 2017-09-30 HISTORY — PX: SURAL NERVE BX: SHX2476

## 2017-09-30 LAB — GLUCOSE, CAPILLARY
Glucose-Capillary: 121 mg/dL — ABNORMAL HIGH (ref 65–99)
Glucose-Capillary: 82 mg/dL (ref 65–99)

## 2017-09-30 SURGERY — SURAL NERVE BIOPSY
Anesthesia: General | Laterality: Left

## 2017-09-30 MED ORDER — EPHEDRINE SULFATE 50 MG/ML IJ SOLN
INTRAMUSCULAR | Status: DC | PRN
  Administered 2017-09-30 (×6): 10 mg via INTRAVENOUS

## 2017-09-30 MED ORDER — FENTANYL CITRATE (PF) 100 MCG/2ML IJ SOLN
INTRAMUSCULAR | Status: DC | PRN
  Administered 2017-09-30: 100 ug via INTRAVENOUS

## 2017-09-30 MED ORDER — BACITRACIN ZINC 500 UNIT/GM EX OINT
TOPICAL_OINTMENT | CUTANEOUS | Status: AC
  Filled 2017-09-30: qty 28.35

## 2017-09-30 MED ORDER — PROMETHAZINE HCL 25 MG/ML IJ SOLN: 6.2500 mg | INTRAMUSCULAR | Status: DC | PRN

## 2017-09-30 MED ORDER — CHLORHEXIDINE GLUCONATE CLOTH 2 % EX PADS: 6.0000 | MEDICATED_PAD | Freq: Once | CUTANEOUS | Status: DC

## 2017-09-30 MED ORDER — MIDAZOLAM HCL 2 MG/2ML IJ SOLN
INTRAMUSCULAR | Status: AC
  Filled 2017-09-30: qty 2

## 2017-09-30 MED ORDER — LIDOCAINE HCL (CARDIAC) 20 MG/ML IV SOLN
INTRAVENOUS | Status: DC | PRN
  Administered 2017-09-30: 100 mg via INTRAVENOUS

## 2017-09-30 MED ORDER — SUCCINYLCHOLINE CHLORIDE 20 MG/ML IJ SOLN
INTRAMUSCULAR | Status: DC | PRN
  Administered 2017-09-30: 120 mg via INTRAVENOUS

## 2017-09-30 MED ORDER — ONDANSETRON HCL 4 MG/2ML IJ SOLN
INTRAMUSCULAR | Status: DC | PRN
  Administered 2017-09-30: 4 mg via INTRAVENOUS

## 2017-09-30 MED ORDER — 0.9 % SODIUM CHLORIDE (POUR BTL) OPTIME
TOPICAL | Status: DC | PRN
  Administered 2017-09-30: 1000 mL

## 2017-09-30 MED ORDER — LACTATED RINGERS IV SOLN
INTRAVENOUS | Status: DC
  Administered 2017-09-30: 12:00:00 via INTRAVENOUS

## 2017-09-30 MED ORDER — SODIUM CHLORIDE 0.9 % IR SOLN
Status: DC | PRN
  Administered 2017-09-30: 14:00:00

## 2017-09-30 MED ORDER — BUPIVACAINE-EPINEPHRINE 0.5% -1:200000 IJ SOLN
INTRAMUSCULAR | Status: DC | PRN
  Administered 2017-09-30: 2 mL
  Administered 2017-09-30: 6 mL

## 2017-09-30 MED ORDER — PROPOFOL 10 MG/ML IV BOLUS
INTRAVENOUS | Status: DC | PRN
  Administered 2017-09-30: 200 mg via INTRAVENOUS
  Administered 2017-09-30: 30 mg via INTRAVENOUS

## 2017-09-30 MED ORDER — BACITRACIN ZINC 500 UNIT/GM EX OINT
TOPICAL_OINTMENT | CUTANEOUS | Status: DC | PRN
  Administered 2017-09-30: 1 via TOPICAL

## 2017-09-30 MED ORDER — SCOPOLAMINE 1 MG/3DAYS TD PT72
MEDICATED_PATCH | TRANSDERMAL | Status: AC
  Administered 2017-09-30: 1.5 mg
  Filled 2017-09-30: qty 1

## 2017-09-30 MED ORDER — HYDROCODONE-ACETAMINOPHEN 5-325 MG PO TABS: 1.0000 | ORAL_TABLET | ORAL | 0 refills | Status: DC | PRN

## 2017-09-30 MED ORDER — PHENYLEPHRINE HCL 10 MG/ML IJ SOLN
INTRAMUSCULAR | Status: DC | PRN
  Administered 2017-09-30: 120 ug via INTRAVENOUS
  Administered 2017-09-30: 80 ug via INTRAVENOUS
  Administered 2017-09-30: 120 ug via INTRAVENOUS
  Administered 2017-09-30: 40 ug via INTRAVENOUS

## 2017-09-30 MED ORDER — FENTANYL CITRATE (PF) 100 MCG/2ML IJ SOLN: 25.0000 ug | INTRAMUSCULAR | Status: DC | PRN

## 2017-09-30 MED ORDER — MIDAZOLAM HCL 5 MG/5ML IJ SOLN
INTRAMUSCULAR | Status: DC | PRN
  Administered 2017-09-30: 2 mg via INTRAVENOUS

## 2017-09-30 MED ORDER — PROPOFOL 10 MG/ML IV BOLUS
INTRAVENOUS | Status: AC
  Filled 2017-09-30: qty 20

## 2017-09-30 MED ORDER — FENTANYL CITRATE (PF) 250 MCG/5ML IJ SOLN
INTRAMUSCULAR | Status: AC
  Filled 2017-09-30: qty 5

## 2017-09-30 MED ORDER — BUPIVACAINE-EPINEPHRINE (PF) 0.5% -1:200000 IJ SOLN
INTRAMUSCULAR | Status: AC
  Filled 2017-09-30: qty 30

## 2017-09-30 SURGICAL SUPPLY — 62 items
APL SKNCLS STERI-STRIP NONHPOA (GAUZE/BANDAGES/DRESSINGS) ×2
BAG DECANTER FOR FLEXI CONT (MISCELLANEOUS) ×2 IMPLANT
BANDAGE ACE 4X5 VEL STRL LF (GAUZE/BANDAGES/DRESSINGS) ×2 IMPLANT
BANDAGE GAUZE 4  KLING STR (GAUZE/BANDAGES/DRESSINGS) ×2 IMPLANT
BENZOIN TINCTURE PRP APPL 2/3 (GAUZE/BANDAGES/DRESSINGS) ×3 IMPLANT
BLADE 10 SAFETY STRL DISP (BLADE) ×2 IMPLANT
BLADE SURG 15 STRL LF DISP TIS (BLADE) ×1 IMPLANT
BLADE SURG 15 STRL SS (BLADE) ×2
CABLE BIPOLOR RESECTION CORD (MISCELLANEOUS) ×1 IMPLANT
CARTRIDGE OIL MAESTRO DRILL (MISCELLANEOUS) ×1 IMPLANT
CONT SPEC 4OZ CLIKSEAL STRL BL (MISCELLANEOUS) ×2 IMPLANT
CORD BIPOLAR FORCEPS 12FT (ELECTRODE) ×2 IMPLANT
DIFFUSER DRILL AIR PNEUMATIC (MISCELLANEOUS) ×2 IMPLANT
DRAPE EXTREMITY TIBURON (DRAPES) ×1 IMPLANT
DRAPE HALF SHEET 40X57 (DRAPES) ×6 IMPLANT
DRAPE ORTHO SPLIT 77X108 STRL (DRAPES) ×4
DRAPE SURG ORHT 6 SPLT 77X108 (DRAPES) IMPLANT
DRSG TELFA 3X8 NADH (GAUZE/BANDAGES/DRESSINGS) ×2 IMPLANT
GAUZE SPONGE 4X4 12PLY STRL (GAUZE/BANDAGES/DRESSINGS) ×3 IMPLANT
GAUZE SPONGE 4X4 12PLY STRL LF (GAUZE/BANDAGES/DRESSINGS) ×1 IMPLANT
GAUZE SPONGE 4X4 16PLY XRAY LF (GAUZE/BANDAGES/DRESSINGS) ×3 IMPLANT
GLOVE BIO SURGEON STRL SZ8 (GLOVE) ×2 IMPLANT
GLOVE BIO SURGEON STRL SZ8.5 (GLOVE) ×2 IMPLANT
GLOVE BIOGEL PI IND STRL 6.5 (GLOVE) IMPLANT
GLOVE BIOGEL PI IND STRL 8 (GLOVE) IMPLANT
GLOVE BIOGEL PI INDICATOR 6.5 (GLOVE) ×1
GLOVE BIOGEL PI INDICATOR 8 (GLOVE) ×1
GLOVE EXAM NITRILE LRG STRL (GLOVE) IMPLANT
GLOVE EXAM NITRILE XL STR (GLOVE) IMPLANT
GLOVE EXAM NITRILE XS STR PU (GLOVE) IMPLANT
GLOVE SURG SS PI 6.0 STRL IVOR (GLOVE) ×1 IMPLANT
GLOVE SURG SS PI 8.0 STRL IVOR (GLOVE) ×1 IMPLANT
GOWN STRL REUS W/ TWL LRG LVL3 (GOWN DISPOSABLE) ×1 IMPLANT
GOWN STRL REUS W/ TWL XL LVL3 (GOWN DISPOSABLE) ×1 IMPLANT
GOWN STRL REUS W/TWL LRG LVL3 (GOWN DISPOSABLE) ×2
GOWN STRL REUS W/TWL XL LVL3 (GOWN DISPOSABLE) ×2
KIT BASIN OR (CUSTOM PROCEDURE TRAY) ×2 IMPLANT
KIT ROOM TURNOVER OR (KITS) ×2 IMPLANT
MARKER SKIN DUAL TIP RULER LAB (MISCELLANEOUS) ×2 IMPLANT
NDL HYPO 25X1 1.5 SAFETY (NEEDLE) ×1 IMPLANT
NEEDLE HYPO 25X1 1.5 SAFETY (NEEDLE) ×2 IMPLANT
NS IRRIG 1000ML POUR BTL (IV SOLUTION) ×2 IMPLANT
OIL CARTRIDGE MAESTRO DRILL (MISCELLANEOUS) ×2
PACK SURGICAL SETUP 50X90 (CUSTOM PROCEDURE TRAY) ×2 IMPLANT
PAD ARMBOARD 7.5X6 YLW CONV (MISCELLANEOUS) ×2 IMPLANT
PAD DRESSING TELFA 3X8 NADH (GAUZE/BANDAGES/DRESSINGS) IMPLANT
PENCIL BUTTON HOLSTER BLD 10FT (ELECTRODE) ×1 IMPLANT
SPONGE INTESTINAL PEANUT (DISPOSABLE) ×1 IMPLANT
STOCKINETTE 4X48 STRL (DRAPES) ×1 IMPLANT
STOCKINETTE IMPERVIOUS LG (DRAPES) ×1 IMPLANT
STRIP CLOSURE SKIN 1/2X4 (GAUZE/BANDAGES/DRESSINGS) ×3 IMPLANT
SUT VIC AB 0 CT1 18XCR BRD8 (SUTURE) ×1 IMPLANT
SUT VIC AB 0 CT1 8-18 (SUTURE) ×2
SUT VIC AB 2-0 CP2 18 (SUTURE) ×4 IMPLANT
SUT VIC AB 3-0 SH 8-18 (SUTURE) ×1 IMPLANT
SYR BULB 3OZ (MISCELLANEOUS) ×2 IMPLANT
SYR CONTROL 10ML LL (SYRINGE) ×2 IMPLANT
TAPE CLOTH SURG 4X10 WHT LF (GAUZE/BANDAGES/DRESSINGS) ×2 IMPLANT
TOWEL GREEN STERILE FF (TOWEL DISPOSABLE) ×6 IMPLANT
TUBE CONNECTING 12X1/4 (SUCTIONS) ×2 IMPLANT
UNDERPAD 30X30 (UNDERPADS AND DIAPERS) ×2 IMPLANT
WATER STERILE IRR 1000ML POUR (IV SOLUTION) ×2 IMPLANT

## 2017-09-30 NOTE — Op Note (Signed)
Brief history: The patient is a 51 year old white female who's had a prior history of foot drop. She developed numbness and tingling in her hands and feet as well as weakness. She was worked up with NCV EMGs which came back consistent with a polyneuropathy. A left sural nerve and vastus lateralis muscle biopsy was requested by the patient's neurologist. I discussed the procedure with the patient. She has decided to proceed with surgery after weighing the risks, benefits and alternatives.  Preop diagnosis: Polyneuropathy  Postop diagnosis: Same  Procedure: Left sural nerve biopsy; left vastus lateralis muscle biopsy  Surgeon: Dr. Earle Gell  Assistant: None  Anesthesia: Gen. endotracheal  Estimated blood loss: Minimal  Specimens: Left sural nerve biopsy, left vastus lateralis muscle biopsy  Drains: None  Complications: None  Description of procedure: The patient was brought to the operating room by the anesthesia team. General endotracheal anesthesia was induced. The patient was turned to the lateral position with her left side up. Her left lower extremity was then prepared with Betadine scrub and Betadine solution. Sterile drapes were applied. I then injected the areas to be incised with Marcaine with epinephrine solution.  We began with the sural nerve biopsy. I made an incision approximately 2 finger breadths above and lateral to the patient's medial malleolus. I carefully dissected through subcutaneous tissue. We inserted a Weitlanter retractor for exposure. Identified the saphenous vein and then identified the sural nerve. I dissected out the sural nerve and obtained approximately 2 cm specimen. As instructed by the pathology department we immediately sent the specimen to the pathologist on a moist Telfa sponge. We then obtained hemostasis using bipolar cautery. I removed the retractor. I then reapproximated patient's subcutaneous tissue with interrupted 3-0 Vicryl suture. I  reapproximated her skin with Steri-Strips and benzoin.  We now turned attention to the muscle biopsy. I made incision in the patient's anterior lateral left thigh. I used the Geologist, engineering for exposure. I dissected through the adipose tissue and identified the muscular fascia. I incised the fascia exposing the left vastus lateralis muscle. I dissected parallel to the fibers and obtained a muscle pops the specimen. We then obtained hemostasis using bipolar cautery. As instructed by the pathology department we immediately sent the specimen to the pathologist on a moist Telfa sponge. I then reapproximated the patient's fascia with interrupted 3-0 Vicryl suture. I reapproximated her subcutaneous tissue with interrupted 3-0 Vicryl suture. I reapproximated the skin with Steri-Strips and benzoin. The wounds were then coated with bacitracin ointment. A sterile dressing was applied. The drapes were removed. The patient was then extubated by the anesthesia team and transported to the post anesthesia care unit in stable condition. I reported all sponge, instrument, and needle counts were correct at the end this case.

## 2017-09-30 NOTE — Anesthesia Procedure Notes (Signed)
Procedure Name: Intubation Date/Time: 09/30/2017 1:01 PM Performed by: Lavell Luster Pre-anesthesia Checklist: Patient identified, Emergency Drugs available, Suction available, Patient being monitored and Timeout performed Patient Re-evaluated:Patient Re-evaluated prior to induction Oxygen Delivery Method: Circle system utilized Preoxygenation: Pre-oxygenation with 100% oxygen Induction Type: IV induction Ventilation: Mask ventilation without difficulty Laryngoscope Size: Mac and 3 Grade View: Grade I Tube type: Oral Tube size: 7.0 mm Number of attempts: 1 Airway Equipment and Method: Stylet Placement Confirmation: ETT inserted through vocal cords under direct vision,  positive ETCO2 and breath sounds checked- equal and bilateral Secured at: 21 cm Tube secured with: Tape Dental Injury: Teeth and Oropharynx as per pre-operative assessment

## 2017-09-30 NOTE — H&P (Signed)
Subjective: The patient is a 51 year old white female who has had numbness in all 4 extremities. She had NCV EMGs done which demonstrated a polyneuropathy. A left sura vastus lateralis muscle biopsy has been requested. The patient has decided to proceed with surgery.   Past Medical History:  Diagnosis Date  . Anginal pain ([place])    chest pain  heart cath done  . Anxiety   . Cancer ([place])   . Chronic bronchitis ([place])    "get it q yr"  . Chronic inflammatory demyelinating polyneuropathy ([place])   . Early cataract    right  . Fibromyalgia   . GERD (gastroesophageal reflux disease)   . Hepatic adenoma   . High cholesterol   . History of gout   . History of hiatal hernia   . Hypertension   . Hypothyroidism   . IBS (irritable bowel syndrome)   . Iron deficiency anemia    "comes and goes"  . Kidney stones   . Migraine    "@ least once/month" (02/18/2015)  . Obesity   . PONV (postoperative nausea and vomiting)   . Proteinuria   . Spinal headache   . Syncope   . Tachycardia   . Type II diabetes mellitus ([place])   . Wears glasses     Past Surgical History:  Procedure Laterality Date  . ABDOMINAL HYSTERECTOMY  ~2012   "lap"  . ANKLE RECONSTRUCTION Left 03/29/2016   Procedure: LEFT ANKLE LATERAL LIGAMENT RECONSTRUCTION AND PERONEAL TENDON REPAIR OR TENOLYSIS;  Surgeon: Wylene Simmer, MD;  Location: [facility];  Service: Orthopedics;  Laterality: Left;  . BACK SURGERY    . CARDIAC CATHETERIZATION  04/2009   "clean"  . COLONOSCOPY    . CYSTOSCOPY WITH URETEROSCOPY, STONE BASKETRY AND STENT PLACEMENT  ~ 2006  . ESOPHAGOGASTRODUODENOSCOPY    . insulin pump     U 500  . LAPAROSCOPIC CHOLECYSTECTOMY  05/2004  . LUMBAR DISC SURGERY  08/2011  . THYROIDECTOMY N/A 02/18/2015   Procedure: TOTAL THYROIDECTOMY;  Surgeon: [name], MD;  Location: [facility];  Service: General;  Laterality: N/A;  . TOTAL THYROIDECTOMY  02/18/2015    Allergies  Allergen Reactions  . Penicillins Anaphylaxis, Shortness Of  Breath, Swelling and Palpitations    Has patient had a PCN reaction causing immediate rash, facial/tongue/throat swelling, SOB or lightheadedness with hypotension: Yes Has patient had a PCN reaction causing severe rash involving mucus membranes or skin necrosis: No Has patient had a PCN reaction that required hospitalization No Has patient had a PCN reaction occurring within the last 10 years: No If all of the above answers are "NO", then may proceed with Cephalosporin use.   . Clarithromycin Nausea And Vomiting    Any antibiotic (doxyclycine, etc)  . Dilaudid [Hydromorphone Hcl] Nausea And Vomiting  . Adhesive [Tape] Itching    Redness  . Latex Itching and Rash    Blisters with prolonged contact  . Milk-Related Compounds Other (See Comments)    Patient preference, does not like  . Moxifloxacin Nausea And Vomiting    Needs Zofran or Phenergan    Social History  Substance Use Topics  . Smoking status: Never Smoker  . Smokeless tobacco: Never Used  . Alcohol use No    Family History  Problem Relation Age of Onset  . Hypertension Mother   . Emphysema Mother   . AAA (abdominal aortic aneurysm) Mother   . Hypertension Father   . Coronary artery disease Father   . Cancer - Prostate  Father   . Renal Disease Father   . Hypertension Brother    Prior to Admission medications   Medication Sig Start Date End Date Taking? Authorizing Provider  ALPRAZolam Duanne Moron) 0.5 MG tablet Take 0.5 mg by mouth every 6 (six) hours as needed for anxiety or sleep.    Yes [provider]  ammonium lactate (LAC-HYDRIN) 12 % lotion Apply 1 application topically 3 (three) times daily as needed for dry skin.    Yes [provider]  atorvastatin (LIPITOR) 20 MG tablet Take 20 mg by mouth daily at 6 PM.    Yes [provider]  BAYER CONTOUR NEXT TEST test strip 1 each by Other route See admin instructions. Testing 3-6 times daily 08/06/14  Yes [provider]  Biotin 10000 MCG  TABS Take 10,000 mcg by mouth at bedtime.   Yes [provider]  cetirizine (ZYRTEC) 10 MG tablet Take 10 mg by mouth daily as needed for allergies.   Yes [provider]  clotrimazole-betamethasone (LOTRISONE) cream Apply 1 application topically 2 (two) times daily as needed. For skin folds 08/30/17  Yes [provider]  cyanocobalamin (,VITAMIN B-12,) 1000 MCG/ML injection Inject 1,000 mcg into the muscle every 30 (thirty) days.   Yes [provider]  cyclobenzaprine (FLEXERIL) 10 MG tablet Take 10 mg by mouth 3 (three) times daily as needed for muscle spasms (scheduled each night at bedtime).    Yes [provider]  D3-50 50000 units capsule Take 50,000 Units by mouth every Saturday. 08/30/17  Yes [provider]  Dermatological Products, [place]. (HYLATOPIC PLUS) CREA Apply 1 application topically 4 (four) times daily as needed (for hands irriation.).    Yes [provider]  dexlansoprazole (DEXILANT) 60 MG capsule Take 60 mg by mouth daily at 6 (six) AM.   Yes [provider]  dicyclomine (BENTYL) 10 MG capsule Take 10 mg by mouth 2 (two) times daily.    Yes [provider]  doxycycline (PERIOSTAT) 20 MG tablet Take 20 mg by mouth 2 (two) times daily as needed. For rosacea. 09/05/17  Yes [provider]  fluticasone (FLONASE) 50 MCG/ACT nasal spray Place 1 spray into the nose daily as needed (for allergies.). Reported on 12/27/2015   Yes [provider]  furosemide (LASIX) 40 MG tablet Take 40 mg by mouth 2 (two) times daily.     Yes [provider]  gabapentin (NEURONTIN) 100 MG capsule Take 1 capsule (100 mg total) by mouth 3 (three) times daily. Patient taking differently: Take 100-500 mg by mouth 3 (three) times daily as needed (for pain based on sedation levels).  08/30/17  Yes Patel, Donika K, DO  gabapentin (NEURONTIN) 300 MG capsule Take 2 capsules (600 mg total) by mouth 3 (three) times  daily. Patient taking differently: Take 600 mg by mouth 3 (three) times daily. (MAY HOLD AND TAKE 100 MG CAPSULES BASED ON SEDATION LEVEL) 04/26/17  Yes Patel, Donika K, DO  halobetasol (ULTRAVATE) 0.05 % cream Apply 1 application topically 2 (two) times daily as needed (for finger blistering).    Yes [provider]  HYDROcodone-acetaminophen (NORCO) 7.5-325 MG tablet Take 1 tablet by mouth every 6 (six) hours as needed (for pain.).  05/08/16  Yes [provider]  hyoscyamine (LEVSIN SL) 0.125 MG SL tablet Place 0.25 mg under the tongue every 4 (four) hours.    Yes [provider]  ibuprofen (ADVIL,MOTRIN) 200 MG tablet Take 400 mg by mouth every 8 (  eight) hours as needed (for pain.).    Yes [provider]  Insulin Human (INSULIN PUMP) SOLN Inject into the skin.   Yes [provider]  insulin regular human CONCENTRATED (HUMULIN R) 500 UNIT/ML SOLN injection Inject 2 Units into the skin See admin instructions. 2.0 units /hour 0500-2359, 1.95units 8416-6063 with carb ratio 1:50 for meal time bolus   Yes [provider]  INVOKAMET 50-1000 MG TABS Take 1 tablet by mouth 2 (two) times daily.  10/20/14  Yes [provider]  ketoconazole (NIZORAL) 2 % cream Apply 1 application topically 2 (two) times daily as needed. For tops of toes. 09/18/17  Yes [provider]  levothyroxine (SYNTHROID, LEVOTHROID) 125 MCG tablet Take 62.5-250 mcg by mouth See admin instructions. Take 2 tablets (250 mcg) daily except 0.5 tablet (62.5 mcg) on Sunday   Yes [provider]  lisinopril (PRINIVIL,ZESTRIL) 20 MG tablet Take 40 mg by mouth 2 (two) times daily.   Yes [provider]  metoprolol succinate (TOPROL-XL) 100 MG 24 hr tablet Take 100 mg by mouth 2 (two) times daily. Take with or immediately following a meal.   Yes [provider]  metroNIDAZOLE (METROCREAM) 0.75 % cream Apply 1 application topically 2 (two) times daily as  needed (for rosacea.).    Yes [provider]  Multiple Vitamins-Minerals (ADULT ONE DAILY GUMMIES PO) Take 2 tablets by mouth at bedtime.   Yes [provider]  mupirocin ointment (BACTROBAN) 2 % Apply 1 application topically 2 (two) times daily as needed. To left toe ulcer. 09/18/17  Yes [provider]  ondansetron (ZOFRAN-ODT) 8 MG disintegrating tablet Take 8 mg by mouth every 6 (six) hours as needed for nausea or vomiting.    Yes [provider]  Oxymetazoline HCl (RHOFADE) 1 % CREA Apply 1 application topically daily as needed (for rosacea).    Yes [provider]  PROCTOSOL HC 2.5 % rectal cream Apply 1 application topically daily as needed for hemorrhoids.  12/28/15  Yes [provider]  triamcinolone ointment (KENALOG) 0.1 % Apply 1 application topically 2 (two) times daily as needed (for finger blister).    Yes [provider]     Review of Systems  Positive ROS: as above  All other systems have been reviewed and were otherwise negative with the exception of those mentioned in the HPI and as above.  Objective: Vital signs in last 24 hours: Temp:  [97.8 F (36.6 C)] 97.8 F (36.6 C) (10/22 1130) Pulse Rate:  [88] 88 (10/22 1130) Resp:  [19] 19 (10/22 1130) BP: (137)/(74) 137/74 (10/22 1130) SpO2:  [97 %] 97 % (10/22 1130) Weight:  [124.7 kg (275 lb)] 124.7 kg (275 lb) (10/22 1232)  General Appearance: Alert, obese Head: Normocephalic, without obvious abnormality, atraumatic Eyes: PERRL, conjunctiva/corneas clear, EOM's intact,    Ears: Normal  Throat: Normal  Neck: Supple, Back: unremarkable, the patient's lumbar incision is well-healed. Lungs: Clear to auscultation bilaterally, respirations unlabored Heart: Regular rate and rhythm, no murmur, rub or gallop Abdomen: Soft, non-tender Extremities: Extremities normal, atraumatic, no cyanosis or edema Skin: unremarkable  NEUROLOGIC:   Mental status: alert and  oriented, Motor Exam - grossly normal, except for weakness in her bowel gastrocnemius EHL/dorsiflexors Sensory Exam - she has numbness in her bilateral hands and feet. Reflexes: decreased Coordination - grossly normal Gait - unsteady Balance - unsteady Cranial Nerves: I: smell Not tested  II: visual acuity  OS: Normal  OD: Normal  II: visual fields Full to confrontation  II: pupils Equal, round, reactive to light  III,VII: ptosis None  III,IV,VI: extraocular muscles  Full ROM  V: mastication Normal  V: facial light touch sensation  Normal  V,VII: corneal reflex  Present  VII: facial muscle function - upper  Normal  VII: facial muscle function - lower Normal  VIII: hearing Not tested  IX: soft palate elevation  Normal  IX,X: gag reflex Present  XI: trapezius strength  5/5  XI: sternocleidomastoid strength 5/5  XI: neck flexion strength  5/5  XII: tongue strength  Normal    Data Review Lab Results  Component Value Date   WBC 5.9 09/26/2017   HGB 13.4 09/26/2017   HCT 41.8 09/26/2017   MCV 94.6 09/26/2017   PLT 193 09/26/2017   Lab Results  Component Value Date   NA 137 09/26/2017   K 4.2 09/26/2017   CL 100 (L) 09/26/2017   CO2 23 09/26/2017   BUN 13 09/26/2017   CREATININE 0.99 09/26/2017   GLUCOSE 159 (H) 09/26/2017   Lab Results  Component Value Date   INR 1.2 07/01/2009    Assessment/Plan: Peripheral neuropathy: I have discussed the situation with the patient. We have discussed the various treatment options. We discussed the procedure of a left sural nerve biopsy and left vastus lateralis muscle biopsy. I have described the surgery to her.We have discussed the risks, benefits, alternates, expected postoperative course, and likelihood of achieving her goals with surgery. I have answered all her questions, she has decided to proceed wth surgery.   Newman Pies D 09/30/2017 12:54 PM

## 2017-09-30 NOTE — Transfer of Care (Signed)
Immediate Anesthesia Transfer of Care Note  Patient: [name]  Procedure(s) Performed: LEFT SURAL NERVE AND MUSCLE BIOPSY (Left )  Patient Location: PACU  Anesthesia Type:General  Level of Consciousness: awake, alert  and oriented  Airway & Oxygen Therapy: Patient connected to face mask oxygen  Post-op Assessment: Post -op Vital signs reviewed and stable  Post vital signs: stable  Last Vitals:  Vitals:   09/30/17 1130 09/30/17 1437  BP: 137/74   Pulse: 88   Resp: 19   Temp: 36.6 C (!) (P) 36.4 C  SpO2: 97%     Last Pain:  Vitals:   09/30/17 1230  TempSrc:   PainSc: 5       Patients Stated Pain Goal: 0 (01/20/14 5208)  Complications: No apparent anesthesia complications

## 2017-09-30 NOTE — OR Nursing (Signed)
Left great toe ulcer plantar side, no bleeding, no purulence. It was prepped with iodine scrub and paint because it was going to be in the sterile field. Surgeon made aware.

## 2017-09-30 NOTE — Anesthesia Preprocedure Evaluation (Signed)
Anesthesia Evaluation  Patient identified by MRN, date of birth, ID band Patient awake    Reviewed: Allergy & Precautions, NPO status , Patient's Chart, lab work & pertinent test results  History of Anesthesia Complications (+) PONV, POST - OP SPINAL HEADACHE and history of anesthetic complications  Airway Mallampati: II  TM Distance: <3 FB Neck ROM: Full    Dental no notable dental hx. (+) Dental Advisory Given   Pulmonary neg pulmonary ROS,    breath sounds clear to auscultation (-) decreased breath sounds      Cardiovascular hypertension, Pt. on medications Normal cardiovascular exam Rhythm:Regular Rate:Normal  Study Conclusions  - Left ventricle: The cavity size was normal. Wall thickness was   increased in a pattern of mild LVH. Systolic function was   vigorous. The estimated ejection fraction was in the range of 65%   to 70%. Wall motion was normal; there were no regional wall   motion abnormalities. Doppler parameters are consistent with   abnormal left ventricular relaxation (grade 1 diastolic   dysfunction).    Neuro/Psych PSYCHIATRIC DISORDERS Anxiety Depression negative psych ROS   GI/Hepatic Neg liver ROS, GERD  Medicated,  Endo/Other  diabetes, Insulin DependentHypothyroidism Morbid obesity  Renal/GU negative Renal ROS  negative genitourinary   Musculoskeletal negative musculoskeletal ROS (+)   Abdominal   Peds negative pediatric ROS (+)  Hematology negative hematology ROS (+)   Anesthesia Other Findings   Reproductive/Obstetrics negative OB ROS                             Anesthesia Physical  Anesthesia Plan  ASA: III  Anesthesia Plan: General   Post-op Pain Management:    Induction: Intravenous  PONV Risk Score and Plan:   Airway Management Planned: Oral ETT  Additional Equipment:   Intra-op Plan:   Post-operative Plan: Extubation in OR  Informed  Consent: I have reviewed the patients History and Physical, chart, labs and discussed the procedure including the risks, benefits and alternatives for the proposed anesthesia with the patient or authorized representative who has indicated his/her understanding and acceptance.   Dental advisory given  Plan Discussed with: CRNA, Surgeon and Anesthesiologist  Anesthesia Plan Comments:         Anesthesia Quick Evaluation

## 2017-09-30 NOTE — Discharge Summary (Signed)
Physician Discharge Summary  Patient ID: [name] MRN: [id number] DOB/AGE: 1966-05-22 51 y.o.  Admit date: 09/30/2017 Discharge date: 09/30/2017  Admission Diagnoses: Polyneuropathy  Discharge Diagnoses: The same Active Problems:   * No active hospital problems. *   Discharged Condition: good  Hospital Course: I performed a left sural nerve biopsy and left vastus lateralis muscle biopsy on the patient on 09/30/2017. The surgery went well.  [name] postoperative course was unremarkable. She was discharged to home on the day of surgery. She was given written discharge instructions.  Consults: None Significant Diagnostic Studies: None Treatments: Left sural nerve biopsy, left vastus lateralis muscle biopsy Discharge Exam: Blood pressure (!) 102/58, pulse 93, temperature 97.7 F (36.5 C), resp. rate 15, height 5\' 6"  (1.676 m), weight 124.7 kg (275 lb), SpO2 93 %. The patient is alert and pleasant. She is moving all 4 extremities well.  Disposition: Home  Discharge Instructions    Call MD for:  difficulty breathing, headache or visual disturbances    Complete by:  As directed    Call MD for:  extreme fatigue    Complete by:  As directed    Call MD for:  hives    Complete by:  As directed    Call MD for:  persistant dizziness or light-headedness    Complete by:  As directed    Call MD for:  persistant nausea and vomiting    Complete by:  As directed    Call MD for:  redness, tenderness, or signs of infection (pain, swelling, redness, odor or green/yellow discharge around incision site)    Complete by:  As directed    Call MD for:  severe uncontrolled pain    Complete by:  As directed    Call MD for:  temperature >100.4    Complete by:  As directed    Diet - low sodium heart healthy    Complete by:  As directed    Discharge instructions    Complete by:  As directed    Call [phone] for a followup appointment. Take a stool softener while you are  using pain medications.   Driving Restrictions    Complete by:  As directed    Do not drive for 2 weeks.   Increase activity slowly    Complete by:  As directed    Lifting restrictions    Complete by:  As directed    Do not lift more than 5 pounds. No excessive bending or twisting.   May shower / Bathe    Complete by:  As directed    He may shower after the pain she is removed 3 days after surgery. Leave the incision alone.   Remove dressing in 48 hours    Complete by:  As directed    Your stitches are under the scan and will dissolve by themselves. The Steri-Strips will fall off after you take a few showers. Do not rub back or pick at the wound, Leave the wound alone.        SignedOphelia Charter 09/30/2017, 3:51 PM

## 2017-10-01 ENCOUNTER — Encounter (HOSPITAL_COMMUNITY): Payer: Self-pay | Admitting: Neurosurgery

## 2017-10-01 NOTE — Anesthesia Postprocedure Evaluation (Signed)
Anesthesia Post Note  Patient: [name]  Procedure(s) Performed: LEFT SURAL NERVE AND MUSCLE BIOPSY (Left )     Patient location during evaluation: PACU Anesthesia Type: General Level of consciousness: sedated Pain management: pain level controlled Vital Signs Assessment: post-procedure vital signs reviewed and stable Respiratory status: spontaneous breathing and respiratory function stable Cardiovascular status: stable Postop Assessment: no apparent nausea or vomiting Anesthetic complications: no    Last Vitals:  Vitals:   09/30/17 1505 09/30/17 1515  BP: 117/75 (!) 102/58  Pulse: 89 93  Resp: 15 15  Temp:  36.5 C  SpO2: 91% 93%    Last Pain:  Vitals:   09/30/17 1523  TempSrc:   PainSc: 0-No pain                 Lynsee Wands DANIEL

## 2017-10-03 DIAGNOSIS — M79676 Pain in unspecified toe(s): Secondary | ICD-10-CM

## 2017-10-09 DIAGNOSIS — E108 Type 1 diabetes mellitus with unspecified complications: Secondary | ICD-10-CM | POA: Diagnosis not present

## 2017-10-16 ENCOUNTER — Telehealth: Payer: Self-pay | Admitting: Podiatry

## 2017-10-16 ENCOUNTER — Other Ambulatory Visit: Payer: BLUE CROSS/BLUE SHIELD

## 2017-10-16 NOTE — Telephone Encounter (Signed)
Left message for pt to call to schedule an appt with Betha to pick up brace.Marland KitchenMarland Kitchen

## 2017-10-17 ENCOUNTER — Ambulatory Visit (INDEPENDENT_AMBULATORY_CARE_PROVIDER_SITE_OTHER): Payer: BLUE CROSS/BLUE SHIELD | Admitting: Podiatry

## 2017-10-17 DIAGNOSIS — M21372 Foot drop, left foot: Secondary | ICD-10-CM | POA: Diagnosis not present

## 2017-10-17 DIAGNOSIS — L97521 Non-pressure chronic ulcer of other part of left foot limited to breakdown of skin: Secondary | ICD-10-CM | POA: Diagnosis not present

## 2017-10-20 NOTE — Progress Notes (Signed)
Subjective: [name] presents today for follow-up evaluation of wound to left hallux.  She presents today with her husband.  She said the wound is doing better she denies any drainage or pus coming from the area.  She has been putting antibiotic ointment on the area daily.  She states the other areas of that she had the wounds previously have healed and she has not noticed any other skin breakdown.  She did pick up her brace yesterday with Benjie Karvonen and she states that the brace is fitting much better not causing any skin irritation.  She states that she is happy with the brace and she and her husband can both tell that she is walking better with the brace.  She has no new lower extremity concerns today. Denies any systemic complaints such as fevers, chills, nausea, vomiting. No acute changes since last appointment, and no other complaints at this time.   Objective: AAO x3, NAD DP/PT pulses palpable bilaterally, CRT less than 3 seconds On the plantar aspect of the left hallux is wound this hyperkeratotic periwound. After debridement the wound measures 0.3 x 0.3 x 0.1 cm. There is no probing to bone, undermining or tunneling. There is no surrounding erythema, ascending cellulitis. There is no fluctuance or crepitus noted no malodor.  The area to the third toe and the other areas of the foot have resolved and there is no other open lesions or pre-ulcerative lesions. There is no pain with calf compression, swelling, warmth, erythema.  Assessment: Chronic plantar left hallux ulceration without signs of infection  Plan: -Treatment options discussed including all alternatives, risks, and complications -Wound to the plantar left hallux is sharply debrided to the area complications or bleeding. Continue antibiotic ointment dressing changes to the wounds daily. Continue offloading all times. Upon watching her walk to the brace is fitting much better and she also is walking with a more normal gait.  She is very happy  with this.  She is awaiting results of the nerve biopsy and muscle biopsy.  We spent quite a bit of time today talking about her ankle and leg issues as well as her neuropathy. -Continue to gradually get used to the brace and break it in but there is any issues to call the office so we can get her scheduled with Betha.  Celesta Gentile, DPM

## 2017-10-22 DIAGNOSIS — G6181 Chronic inflammatory demyelinating polyneuritis: Secondary | ICD-10-CM | POA: Diagnosis not present

## 2017-10-22 DIAGNOSIS — Z6841 Body Mass Index (BMI) 40.0 and over, adult: Secondary | ICD-10-CM | POA: Diagnosis not present

## 2017-10-22 DIAGNOSIS — I10 Essential (primary) hypertension: Secondary | ICD-10-CM | POA: Diagnosis not present

## 2017-10-28 ENCOUNTER — Telehealth: Payer: Self-pay | Admitting: Neurology

## 2017-10-28 NOTE — Telephone Encounter (Signed)
Call patient to discuss results of muscle biopsy.  Left sural nerve biopsy performed on 10/22 shows chronic (predominant) and acute loss of large myelinated fibers with possible demyelination/remyelination process consistent with prior diagnosis of chronic inflammatory demyelinating poly-radicular neuropathy; marked thickening of the endoneutrial vessels consistent with diabetic neuropathy, clinical correlation recommended. EM and tease prep studies pending.  Left vastus lateralis muscle biopsy: 2 isolated foci of very mild perivascular, possibly vascular, inflammation, and mild nonspecific mild myofiber atrophy and skeletal muscle example without myofiber necrosis.  The above results was discussed with patient.  Surprisingly, CSF testing was normal and did not find evidence of albumin no cytologic dissociation, as would be expected with CIDP; however, her electrodiagnostic studies was most suggestive of this.  With her nerve biopsy also consistent with CIDP, I recommend treatment with IVIG or Solu-Medrol. Patient informed that there are risks and benefits to both options.  With her history of diabetes, Solu-Medrol would certainly increase her blood sugars and close monitoring will be necessary.  She is on insulin pump, and would be able to make changes as needed.  IVIG is costly and we would need to obtain prior authorization for this, though, she has proteinuria at baseline and sees nephrology for this.  If IVIG is chosen, I would need to be sure if this okay to use from her nephrologist.    She recently switched health insurance plans and will be contacting them to see what her coverage options and deductible applies to this calendar year if $5000 or starting January 1.  All questions were answered.  Will await further information from patient prior to deciding whether treating with Solu-Medrol or IVIG.  Jenna Montesano K. Posey Pronto, DO

## 2017-10-30 ENCOUNTER — Encounter (HOSPITAL_COMMUNITY): Payer: Self-pay

## 2017-11-04 ENCOUNTER — Other Ambulatory Visit: Payer: Self-pay | Admitting: *Deleted

## 2017-11-04 NOTE — Patient Outreach (Addendum)
Spoke to [employer])  via phone advising  that disease self-management services will be transitioned from the [facility] program to either [facility] or [facility] in 2019.  Also advised her that a letter will be mailed to the home residence with details of this transition.                                                    Will close case to [facility] diabetes program. Barrington Ellison RN,CCM,CDE [place] Management Coordinator [facility] and [employer] [phone] Office Fax [phone]

## 2017-11-07 ENCOUNTER — Ambulatory Visit (INDEPENDENT_AMBULATORY_CARE_PROVIDER_SITE_OTHER): Payer: BLUE CROSS/BLUE SHIELD | Admitting: Podiatry

## 2017-11-07 DIAGNOSIS — M21372 Foot drop, left foot: Secondary | ICD-10-CM

## 2017-11-07 DIAGNOSIS — L97521 Non-pressure chronic ulcer of other part of left foot limited to breakdown of skin: Secondary | ICD-10-CM | POA: Diagnosis not present

## 2017-11-08 ENCOUNTER — Encounter (HOSPITAL_COMMUNITY): Payer: Self-pay

## 2017-11-08 DIAGNOSIS — G894 Chronic pain syndrome: Secondary | ICD-10-CM | POA: Diagnosis not present

## 2017-11-08 DIAGNOSIS — E1129 Type 2 diabetes mellitus with other diabetic kidney complication: Secondary | ICD-10-CM | POA: Diagnosis not present

## 2017-11-08 DIAGNOSIS — G379 Demyelinating disease of central nervous system, unspecified: Secondary | ICD-10-CM | POA: Diagnosis not present

## 2017-11-08 DIAGNOSIS — N08 Glomerular disorders in diseases classified elsewhere: Secondary | ICD-10-CM | POA: Diagnosis not present

## 2017-11-08 NOTE — Progress Notes (Signed)
Subjective: [name] presents the office today with her husband for follow-up evaluation of a wound to the left hallux.  She states that she still has some callus along the area.  She denies any drainage or pus and she has no redness or swelling to the area.  She does feel it is getting somewhat better but it does fluctuate in size.  She also states that the brace is fitting well and she can walk much better.  She had one area she points along the navicular tuberosity where she had some redness that first but that is resolved.  She has not has any other wounds or skin breakdown from the brace.  She also did follow-up and she was told that she had inflammation along the muscle biopsy.  Her neurologist is discussing IVIG versus Solu-Medrol.  She is awaiting insurance information about this. Denies any systemic complaints such as fevers, chills, nausea, vomiting. No acute changes since last appointment, and no other complaints at this time.   Objective: AAO x3, NAD DP/PT pulses palpable bilaterally, CRT less than 3 seconds Protective sensation decreased with Simms Weinstein monofilament To the plantar aspect the left hallux continues to be ulceration with hyperkeratotic tissue along the wound.  After debridement the wound measures 0.3 x 0.3 and is superficial.  There is no probing, undermining or tunneling and there is no fluctuation or crepitation.  There is no malodor.  There is no surrounding erythema or ascending cellulitis.  There is no other open lesions or pre-ulcerative lesions.  There is no erythema to the navicular tuberosity or any other areas of skin irritation from the brace. No pain with calf compression, swelling, warmth, erythema  Assessment: Ulceration left plantar hallux  Plan: -All treatment options discussed with the patient including all alternatives, risks, complications.  -With the wound was sharply debrided today after verbal consent was obtained none healthy, granular tissue.   Silvadene was applied followed by dressing.  Continue with daily dressing changes. -Continue with the brace.  Discussed piece of moleskin or padding to the navicular tuberosity but will have her come to see about the next week to see if we can do some to help take some more pressure off the navicular tuberosity area. -Patient encouraged to call the office with any questions, concerns, change in symptoms.   Trula Slade DPM

## 2017-11-12 ENCOUNTER — Telehealth: Payer: Self-pay | Admitting: Neurology

## 2017-11-12 NOTE — Telephone Encounter (Signed)
Patient called needing to speak with you regarding her [employer] which is now [employer] instead of [employer]. They are needing Codes? Please Call. Thanks

## 2017-11-15 NOTE — Telephone Encounter (Signed)
I spoke with patient and got the new insurance information.  I will call to try and get IVIG approved.

## 2017-11-20 ENCOUNTER — Ambulatory Visit: Payer: 59 | Admitting: Internal Medicine

## 2017-11-28 ENCOUNTER — Encounter (HOSPITAL_COMMUNITY): Payer: Self-pay

## 2017-12-05 ENCOUNTER — Ambulatory Visit: Payer: BLUE CROSS/BLUE SHIELD | Admitting: Podiatry

## 2017-12-19 ENCOUNTER — Ambulatory Visit (INDEPENDENT_AMBULATORY_CARE_PROVIDER_SITE_OTHER): Payer: BLUE CROSS/BLUE SHIELD | Admitting: Podiatry

## 2017-12-19 ENCOUNTER — Encounter: Payer: Self-pay | Admitting: Podiatry

## 2017-12-19 DIAGNOSIS — L97521 Non-pressure chronic ulcer of other part of left foot limited to breakdown of skin: Secondary | ICD-10-CM

## 2017-12-20 NOTE — Progress Notes (Signed)
Subjective: Cariah presents the office today for follow-up evaluation of a wound to the left hallux.  She states that the left big toe is "still acting up".  She denies any drainage or pus coming from.  She denies any swelling or redness.  Overall she states the brace is helping her quite a bit she can walk much better with wearing the brace.  The brace is not cause any skin irritation but she does notice pressure inside the shoes and the brace but not putting pressure to her skin.  She denies any new sores and she denies any skin irritation currently.  She has no other concerns today. Denies any systemic complaints such as fevers, chills, nausea, vomiting. No acute changes since last appointment, and no other complaints at this time.   Objective: AAO x3, NAD DP/PT pulses palpable bilaterally, CRT less than 3 seconds Protective sensation decreased with Simms Weinstein monofilament To the plantar aspect the left hallux continues to be ulceration with hyperkeratotic tissue along the wound.  After debridement the wound measures 0.2 x 0.2 and is superficial and almost an abrasion type lesion.  There is no probing, undermining or tunneling and there is no fluctuation or crepitation.  There is no malodor.  There is no surrounding erythema or ascending cellulitis.  There is no other open lesions or pre-ulcerative lesions.  There is no erythema to the navicular tuberosity or any other areas of skin irritation from the brace.  Brace appears to be fitting well. No pain with calf compression, swelling, warmth, erythema  Assessment: Ulceration left plantar hallux without signs of infection  Plan: -All treatment options discussed with the patient including all alternatives, risks, complications.  -The wound was sharply debrided today after verbal consent was obtained none healthy, granular tissue.  Silvadene was applied followed by dressing.  Continue with daily dressing changes.  Overall the wound is  improving. -Brace is fitting well.  Continue the brace as well as walker.  Continue to follow-up with neurology-considering IVIG therapy -Discussed that see her back in couple weeks to debride the wound however due to cost she does not want to come in in 2 weeks.  I will see her in 4 weeks but call with any questions or concerns or any change in symptoms and she agrees to this.  Trula Slade DPM

## 2017-12-23 ENCOUNTER — Ambulatory Visit (INDEPENDENT_AMBULATORY_CARE_PROVIDER_SITE_OTHER): Payer: Self-pay | Admitting: Neurology

## 2017-12-23 ENCOUNTER — Encounter: Payer: Self-pay | Admitting: Neurology

## 2017-12-23 VITALS — BP 104/60 | HR 92 | Ht 66.0 in | Wt 283.5 lb

## 2017-12-23 DIAGNOSIS — G6181 Chronic inflammatory demyelinating polyneuritis: Secondary | ICD-10-CM

## 2017-12-23 NOTE — Patient Instructions (Addendum)
1.  We will contact [facility] about the status of your IVIG 2.  Continue gabapentin 600mg  three times daily  Return to clinic in 2 months

## 2017-12-23 NOTE — Progress Notes (Signed)
Follow-up Visit   Date: 12/23/17    [name] MRN: [id number] DOB: Oct 11, 1966   Interim History: [name] is a 52 y.o. right-handed Caucasian female with insulin-dependent diabetes mellitus, thyroid cancer s/p resection secondary hypothyroidism (2016), hypertension, hyperlipidemia, GERD, s/p left L5 hemilaminectomy and discectomy, chronic left ankle pain returning for a follow-up of chronic inflammatory demyelinating polyradiculoneuropathy.  History of present illness: In 2012, she underwent left L5 hemilaminectomy and discectomy by Dr. Arnoldo Morale for severe radicular left leg and foot pain with foot drop. Pain was significantly improved post-op and over the next few months, she was able to extend her toes and foot.  She has residual reduced sensation of the left great toe.    Starting in 2016, she had a spell of night sweats and burning sensation of the body.  She also has SOB and tachycardia and was later found to have thyroid cancer.  She also was treated with radioactive iodine later because her margins were not clean.  By June 2016, she was feeling very fatigued and thought it was due to deconditioning so started walking, but then developed left knee pain.  She attributed this to being overweight and sedentary, so started to walk, but developed left hip pain and left foot pain. Ankle pain was deep and throbbing. She was recommended to immobilize the foot due to possible stress fracture.  She saw Dr. Doran Durand, orthopaedics, who recommended that she wear a cast.  She recalls it being better while wearing a cast, but never completely resolved. Once the cast was removed, she realized that she had weakness of the foot with eversion, inversion, and dorsiflexion. She failed PT, bracing, pain meds and immobilization and underwent left ankle surgery with ligament reconstruction and tendonlysis for instability and tendonitis in April 2017.  Unfortuantely, this did not  improve her pain and she was referred to pain management for complex regional pain syndrome, however they did not feel that she had this.  She did physical therapy and was able to improve left dorsiflexion some.  Because of ongoing deep, throbbing pain of the left ankle, she was referred for NCS/EMG which showed acute on chronic sensorimotor polyneuropathy affecting both legs, worse on the right.  She underwent CSF and serology testing which showed normal CSF protein.  Labs notable for vitamin B1 and B12 deficiency.   Around summer 2017, she started having new low back pain.  Pain is sharp and localized to her low back and radiates into her left buttocks, but does not radiate into her leg.  She saw Dr. Arnoldo Morale again and MRI lumbar spine which showed small L3-4 disc protrusion near the left L4 nerve root.  Post-op changes at left L5-S1 was stable.  Later in 2017, she also began having new burning and tingling sensation of the hands as well as left hip flexion weakness. She has not had any new falls and walks with a cane as needed.  She is getting fit for a left AFO.  UPDATE 08/30/2017:    At her last visit, I offered additional work-up with MRI brain, cervical spine, genetic testing, and offered option for an academic center.  Since her last visit, she has had 9 falls, three of which were syncopal episodes.  She was evaluated by Dr. Caryl Comes for syncope and diagnosed with dysautonomia. She is compliant with abdominal binder.  She continues to have worsening weakness of the hands and legs and is worried about her functional recovery.  She also tells  me that she will be loosing her insurance at the end of October and applying for social security benefits.   UPDATE 12/23/2017:  She underwent left sural nerve biopsy by Dr. Arnoldo Morale in October 2018 which confirmed CIDP. We have been in the process of trying to get IVIG approved.   She is having greater difficulty with reaching for objects overhead and keeping her arms  raised.  She also complains of new issues with bladder incontinence and some bowel incontinence.  She has had 11 falls since her last visit, some of which are syncopal spells.  Her weakness of the arms and legs are progressed, but tingling/burning pain is well controlled on gabapentin 693m TID.  Past Medical History:  Diagnosis Date  . [place] ([facility]    chest pain  heart cath done  . Anxiety   . Cancer ([facility]   . Chronic bronchitis ([facility]    "get it q yr"  . Chronic inflammatory dem[place]inating polyneuropathy ([place]   . Early cataract    right  . Fibromyalgia   . GERD (gastroesophageal reflux disease)   . Hepatic adenoma   . High cholesterol   . History of gout   . History of hiatal hernia   . Hypertension   . Hypothyroidism   . IBS (irritable bowel syndrome)   . Iron deficiency anemia    "comes and goes"  . Kidney stones   . Migraine    "@ least once/month" (02/18/2015)  . Obesity   . PONV (postoperative nausea and vomiting)   . Proteinuria   . Spinal headache   . Syncope   . Tachycardia   . Type II diabetes mellitus ([facility]   . Wears glasses     Family History  Problem Relation Age of Onset  . Hypertension Mother   . Emphysema Mother   . AAA (abdominal aortic aneurysm) Mother   . Hypertension Father   . Coronary artery disease Father   . Cancer - Prostate Father   . Renal Disease Father   . Hypertension Brother    Social History   Tobacco Use  . Smoking status: Never Smoker  . Smokeless tobacco: Never Used  Substance Use Topics  . Alcohol use: No  . Drug use: No    Medications:  Current Outpatient Medications on File Prior to Visit  Medication Sig Dispense Refill  . ALPRAZolam (XANAX) 0.5 MG tablet Take 0.5 mg by mouth every 6 (six) hours as needed for anxiety or sleep.     .Marland Kitchenammonium lactate (LAC-HYDRIN) 12 % lotion Apply 1 application topically 3 (three) times daily as needed for dry skin.     .Marland Kitchenatorvastatin (LIPITOR) 20 MG tablet Take 20 mg by mouth  daily at 6 PM.     . BAYER CONTOUR NEXT TEST test strip 1 each by Other route See admin instructions. Testing 3-6 times daily  4  . Biotin 10000 MCG TABS Take 10,000 mcg by mouth at bedtime.    . cetirizine (ZYRTEC) 10 MG tablet Take 10 mg by mouth daily as needed for allergies.    . clotrimazole-betamethasone (LOTRISONE) cream Apply 1 application topically 2 (two) times daily as needed. For skin folds  2  . cyanocobalamin (,VITAMIN B-12,) 1000 MCG/ML injection Inject 1,000 mcg into the muscle every 30 (thirty) days.    . cyclobenzaprine (FLEXERIL) 10 MG tablet Take 10 mg by mouth 3 (three) times daily as needed for muscle spasms (scheduled each night at bedtime).     .Marland Kitchen  D3-50 50000 units capsule Take 50,000 Units by mouth every Saturday.  4  . Dermatological Products, Misc. (HYLATOPIC PLUS) CREA Apply 1 application topically 4 (four) times daily as needed (for hands irriation.).     Marland Kitchen dexlansoprazole (DEXILANT) 60 MG capsule Take 60 mg by mouth daily at 6 (six) AM.    . dicyclomine (BENTYL) 10 MG capsule Take 10 mg by mouth 2 (two) times daily.     Marland Kitchen doxycycline (PERIOSTAT) 20 MG tablet Take 20 mg by mouth 2 (two) times daily as needed. For rosacea.  3  . fluticasone (FLONASE) 50 MCG/ACT nasal spray Place 1 spray into the nose daily as needed (for allergies.). Reported on 12/27/2015    . furosemide (LASIX) 40 MG tablet Take 40 mg by mouth 2 (two) times daily.      Marland Kitchen gabapentin (NEURONTIN) 100 MG capsule Take 1 capsule (100 mg total) by mouth 3 (three) times daily. (Patient taking differently: Take 100-500 mg by mouth 3 (three) times daily as needed (for pain based on sedation levels). ) 90 capsule 5  . gabapentin (NEURONTIN) 300 MG capsule Take 2 capsules (600 mg total) by mouth 3 (three) times daily. (Patient taking differently: Take 600 mg by mouth 3 (three) times daily. (MAY HOLD AND TAKE 100 MG CAPSULES BASED ON SEDATION LEVEL)) 540 capsule 3  . halobetasol (ULTRAVATE) 0.05 % cream Apply 1  application topically 2 (two) times daily as needed (for finger blistering).     Marland Kitchen HYDROcodone-acetaminophen (NORCO) 7.5-325 MG tablet Take 1 tablet by mouth every 6 (six) hours as needed (for pain.).     Marland Kitchen HYDROcodone-acetaminophen (NORCO/VICODIN) 5-325 MG tablet Take 1 tablet by mouth every 4 (four) hours as needed for moderate pain. 20 tablet 0  . hyoscyamine (LEVSIN SL) 0.125 MG SL tablet Place 0.25 mg under the tongue every 4 (four) hours.     Marland Kitchen ibuprofen (ADVIL,MOTRIN) 200 MG tablet Take 400 mg by mouth every 8 (eight) hours as needed (for pain.).     . Insulin Human (INSULIN PUMP) SOLN Inject into the skin.    Marland Kitchen insulin regular human CONCENTRATED (HUMULIN R) 500 UNIT/ML SOLN injection Inject 2 Units into the skin See admin instructions. 2.0 units /hour 0500-2359, 1.95units 6301-6010 with carb ratio 1:50 for meal time bolus    . INVOKAMET 50-1000 MG TABS Take 1 tablet by mouth 2 (two) times daily.   5  . ketoconazole (NIZORAL) 2 % cream Apply 1 application topically 2 (two) times daily as needed. For tops of toes.    Marland Kitchen levothyroxine (SYNTHROID, LEVOTHROID) 125 MCG tablet Take 62.5-250 mcg by mouth See admin instructions. Take 2 tablets (250 mcg) daily except 0.5 tablet (62.5 mcg) on Sunday    . lisinopril (PRINIVIL,ZESTRIL) 20 MG tablet Take 40 mg by mouth 2 (two) times daily.    . metoprolol succinate (TOPROL-XL) 100 MG 24 hr tablet Take 100 mg by mouth 2 (two) times daily. Take with or immediately following a meal.    . metroNIDAZOLE (METROCREAM) 0.75 % cream Apply 1 application topically 2 (two) times daily as needed (for rosacea.).     Marland Kitchen Multiple Vitamins-Minerals (ADULT ONE DAILY GUMMIES PO) Take 2 tablets by mouth at bedtime.    . mupirocin ointment (BACTROBAN) 2 % Apply 1 application topically 2 (two) times daily as needed. To left toe ulcer.    . ondansetron (ZOFRAN-ODT) 8 MG disintegrating tablet Take 8 mg by mouth every 6 (six) hours as needed for nausea or vomiting.     Marland Kitchen  Oxymetazoline HCl (RHOFADE) 1 % CREA Apply 1 application topically daily as needed (for rosacea).     Marland Kitchen PROCTOSOL HC 2.5 % rectal cream Apply 1 application topically daily as needed for hemorrhoids.   2  . triamcinolone ointment (KENALOG) 0.1 % Apply 1 application topically 2 (two) times daily as needed (for finger blister).      No current facility-administered medications on file prior to visit.     Allergies:  Allergies  Allergen Reactions  . Penicillins Anaphylaxis, Shortness Of Breath, Swelling and Palpitations    Has patient had a PCN reaction causing immediate rash, facial/tongue/throat swelling, SOB or lightheadedness with hypotension: Yes Has patient had a PCN reaction causing severe rash involving mucus membranes or skin necrosis: No Has patient had a PCN reaction that required hospitalization No Has patient had a PCN reaction occurring within the last 10 years: No If all of the above answers are "NO", then may proceed with Cephalosporin use.   . Clarithromycin Nausea And Vomiting    Any antibiotic (doxyclycine, etc)  . Dilaudid [Hydromorphone Hcl] Nausea And Vomiting  . Adhesive [Tape] Itching    Redness  . Latex Itching and Rash    Blisters with prolonged contact  . Milk-Related Compounds Other (See Comments)    Patient preference, does not like  . Moxifloxacin Nausea And Vomiting    Needs Zofran or Phenergan    Review of Systems:  CONSTITUTIONAL: No fevers, chills, night sweats, or weight loss.  EYES: No visual changes or eye pain ENT: No hearing changes.  No history of nose bleeds.   RESPIRATORY: No cough, wheezing and shortness of breath.   CARDIOVASCULAR: Negative for chest pain, and palpitations.   GI: Negative for abdominal discomfort, blood in stools or black stools.  No recent change in bowel habits.   GU:  No history of incontinence.   MUSCLOSKELETAL: No history of joint pain or swelling.  +myalgias.  +weakness SKIN: Negative for lesions, rash, and itching.    ENDOCRINE: Negative for cold or heat intolerance, polydipsia or goiter.   PSYCH:  No  depression or anxiety symptoms.   NEURO: As Above.   Vital Signs:  BP 104/60   Pulse 92   Ht '5\' 6"'  (1.676 m)   Wt 283 lb 8 oz (128.6 kg)   SpO2 99%   BMI 45.76 kg/m   Neurological Exam: MENTAL STATUS including orientation to time, place, person, recent and remote memory, attention span and concentration, language, and fund of knowledge is normal.  Speech is not dysarthric.  CRANIAL NERVES:  Pupils are round and reactive to light.  Extraocular muscles are intact.  Face is symmetric.  Palate elevates symmetrically.   MOTOR: Tone is normal.  There is some atrophy of the intrinsic hand muscles.    Right Upper Extremity:    Left Upper Extremity:    Deltoid  4/5   Deltoid  4/5   Biceps  4+/5   Biceps  4+/5   Triceps  4+/5   Triceps  4+/5   Wrist extensors  4-/5   Wrist extensors  4-/5   Wrist flexors  4-/5   Wrist flexors  4-/5   Finger extensors  4-/5   Finger extensors  4-/5   Finger flexors  4-/5   Finger flexors  4-/5   Dorsal interossei  3+/5   Dorsal interossei  3+/5   Abductor pollicis  4/5   Abductor pollicis  4/5   Tone (Ashworth scale)  0  Tone (Ashworth  scale)  0   Right Lower Extremity:    Left Lower Extremity:    Hip flexors  4-/5   Hip flexors  4-/5   Hip extensors  5/5   Hip extensors  5/5   Knee flexors  5/5   Knee flexors  4/5   Knee extensors          5-/5 Inversion 5/5  inversion 2/5  Eversion 5/5  Eversion 2/5  Dorsiflexors  4/5   Dorsiflexors  4/5   Plantarflexors  4/5   Plantarflexors  4/5   Toe extensors  4-/5   Toe extensors  1/5   Toe flexors  4-/5   Toe flexors  1/5   Tone (Ashworth scale)  0  Tone (Ashworth scale)  0    MSRs:    Right                                                                 Left brachioradialis 2+  brachioradialis 2+  biceps 2+  biceps 2+  triceps 2+  triceps 2+  patellar 3+  patellar 3+  ankle jerk  0  ankle jerk 0  Hoffman no  Hoffman no  plantar response down  plantar response down   SENSORY:  Vibration 50% at the knees bilaterally, absent at the ankles; Temperature and pin prick is reduced over the hands and below the knees.   COORDINATION/GAIT:  Gait appears very unsteady with cane, slow, and waddling  Data: NCS/EMG of the arms 01/29/2017: The electrophysiologic testing is most consistent with an active on chronic, distal and symmetric sensorimotor polyneuropathy, demyelinating and axon loss in type. Overall, these findings are severe in degree electrically.   In addition, the presence of temporal dispersion suggests an acquired condition.  NCS/EMG of the legs 01/10/2017:    The electrophysiologic findings are most consistent with a subacute sensorimotor polyneuropathy, axon loss in type, affecting the lower extremities and worse on the left. Overall, these findings are severe in degree electrically.  Labs 01/25/2017:  ESR 10, CRP 0.3, vitamin B12 256, ACE 5, MMA 231, ANA neg, SSA/B neg, copper 92, vitamin B1 6*, SPEP with IFE no M protein  CSF 02/19/2017:  R1200, W0 G76 P37  ACE 10, IgG index 0.47, Lyme neg, cytology neg, no OCB  MRI lumbar spine wwo contrast 02/28/2017: 1. Stable appearance of the lumbar spine. No acute abnormality identified. 2. Postoperative changes on the left L5-S1 without residual stenosis. 3. Small left subarticular disc protrusion at L3-4 encroaching upon the left lateral recess, and potentially affecting the descending left L4 nerve root. 4. Mild bilateral foraminal stenosis at L4-5 related to disc bulge and facet degeneration.  MRI cervical spine wwo contrast 09/09/2017: 1. Normal MRI appearance of the cervical spinal cord. No cord signal abnormality is identified. 2. Multilobulated disc protrusion at C4-5, flattening the right hemi cord without cord signal changes. 3. Broad posterior disc bulge at C6-7 with resultant mild canal and left C7 foraminal  stenosis. 4. Central disc protrusion at C3-4 with resultant mild spinal stenosis. No significant cord deformity.  MRI brain wwo contrast 09/09/2017:  Normal MRI of the brain. No acute intracranial abnormality identified.  Left sural nerve biopsy performed on 10/22 shows chronic (predominant) and acute loss of large myelinated  fibers with possible demyelination/remyelination process consistent with prior diagnosis of chronic inflammatory demyelinating poly-radicular neuropathy; marked thickening of the endoneutrial vessels consistent with diabetic neuropathy, clinical correlation recommended.   Left vastus lateralis muscle biopsy: 2 isolated foci of very mild perivascular, possibly vascular, inflammation, and mild nonspecific mild myofiber atrophy and skeletal muscle example without myofiber necrosis.   IMPRESSION/PLAN: 1.  Chronic inflammatory polyradiculoneuropathy affecting affecting glove-stocking distribution, worse on the left.  This was ultimately diagnosed by sural nerve biopsy (09/2017); CSF testing was normal, however NCS/EMG was highly suggestive of a subacute polyneuropathy.  Serology testing for underlying etiology showed vitamin B12 and B1 deficiency, which has been repleted.  Her diabetes has been well-controlled so unlikely to be the primary contributor.  Her results were reviewed and discussed in detail.   Clinically, there is progressive weakness of the extremities and both fine and gross motor tasks are increasingly challenging.  I offered some recommendations for hand assist devices and options.  Now that we finally have a diagnosis, the next step is starting her on immunotherapy.  I will follow-up with [facility] regarding the status of her IVIG dosed at 2g/kg over 5 days.    If IVIG is cost prohibitive, then methylprednisolone 1g x 5 days will be the next option, however, she will need close monitoring of her blood glucose as she is diabetic.    For her painful paresthesias,  continue 638m TID.   2.  Chronic low back pain.  Continue flexeril 532mat bedtime.  Return to clinic in 2 months  Greater than 50% of this 40 minute visit was spent in counseling, explanation of diagnosis, planning of further management, and coordination of care due to the nature of her progressive disease and many questions.   Thank you for allowing me to participate in patient's care.  If I can answer any additional questions, I would be pleased to do so.    Sincerely,    Kiasha Bellin K. PaPosey ProntoDO   Cc: StReynold BowenMD; JaMauricia AreaMD

## 2017-12-24 ENCOUNTER — Encounter: Payer: Self-pay | Admitting: *Deleted

## 2018-01-07 ENCOUNTER — Other Ambulatory Visit: Payer: BLUE CROSS/BLUE SHIELD

## 2018-01-07 DIAGNOSIS — E538 Deficiency of other specified B group vitamins: Secondary | ICD-10-CM | POA: Diagnosis not present

## 2018-01-07 DIAGNOSIS — E7849 Other hyperlipidemia: Secondary | ICD-10-CM | POA: Diagnosis not present

## 2018-01-07 DIAGNOSIS — E890 Postprocedural hypothyroidism: Secondary | ICD-10-CM | POA: Diagnosis not present

## 2018-01-07 DIAGNOSIS — R8290 Unspecified abnormal findings in urine: Secondary | ICD-10-CM | POA: Diagnosis not present

## 2018-01-07 DIAGNOSIS — E1129 Type 2 diabetes mellitus with other diabetic kidney complication: Secondary | ICD-10-CM | POA: Diagnosis not present

## 2018-01-07 DIAGNOSIS — I10 Essential (primary) hypertension: Secondary | ICD-10-CM | POA: Diagnosis not present

## 2018-01-14 ENCOUNTER — Other Ambulatory Visit: Payer: BLUE CROSS/BLUE SHIELD

## 2018-01-14 DIAGNOSIS — C73 Malignant neoplasm of thyroid gland: Secondary | ICD-10-CM | POA: Diagnosis not present

## 2018-01-14 DIAGNOSIS — Z1389 Encounter for screening for other disorder: Secondary | ICD-10-CM | POA: Diagnosis not present

## 2018-01-14 DIAGNOSIS — I10 Essential (primary) hypertension: Secondary | ICD-10-CM | POA: Diagnosis not present

## 2018-01-14 DIAGNOSIS — E1140 Type 2 diabetes mellitus with diabetic neuropathy, unspecified: Secondary | ICD-10-CM | POA: Diagnosis not present

## 2018-01-14 DIAGNOSIS — E1142 Type 2 diabetes mellitus with diabetic polyneuropathy: Secondary | ICD-10-CM | POA: Diagnosis not present

## 2018-01-14 DIAGNOSIS — Z0000 Encounter for general adult medical examination without abnormal findings: Secondary | ICD-10-CM | POA: Diagnosis not present

## 2018-01-14 DIAGNOSIS — E11621 Type 2 diabetes mellitus with foot ulcer: Secondary | ICD-10-CM | POA: Diagnosis not present

## 2018-01-15 DIAGNOSIS — Z1212 Encounter for screening for malignant neoplasm of rectum: Secondary | ICD-10-CM | POA: Diagnosis not present

## 2018-01-17 ENCOUNTER — Ambulatory Visit (INDEPENDENT_AMBULATORY_CARE_PROVIDER_SITE_OTHER): Payer: BLUE CROSS/BLUE SHIELD | Admitting: Podiatry

## 2018-01-17 ENCOUNTER — Encounter: Payer: Self-pay | Admitting: Internal Medicine

## 2018-01-17 ENCOUNTER — Ambulatory Visit (INDEPENDENT_AMBULATORY_CARE_PROVIDER_SITE_OTHER): Payer: BLUE CROSS/BLUE SHIELD | Admitting: Internal Medicine

## 2018-01-17 ENCOUNTER — Encounter: Payer: Self-pay | Admitting: *Deleted

## 2018-01-17 VITALS — BP 128/72 | HR 104 | Ht 66.0 in | Wt 277.0 lb

## 2018-01-17 DIAGNOSIS — R55 Syncope and collapse: Secondary | ICD-10-CM | POA: Diagnosis not present

## 2018-01-17 DIAGNOSIS — I951 Orthostatic hypotension: Secondary | ICD-10-CM | POA: Diagnosis not present

## 2018-01-17 DIAGNOSIS — L97521 Non-pressure chronic ulcer of other part of left foot limited to breakdown of skin: Secondary | ICD-10-CM | POA: Diagnosis not present

## 2018-01-17 NOTE — Progress Notes (Signed)
Patient Care Team: Reynold Bowen, MD as PCP - General (Endocrinology)   HPI  [name] is a 52 y.o. female Seen in follow-up for recurrent syncope with hypertension,  documented orthostatic hypotension in the context of diabetes with likely some degree of neuropathy and now intercurrently diagnosed with CIDP   We hve recommended compressive clothing as her systolic hypertension made sodium loading a bad idea.  Long-term use of beta blockers have been helpful with exercise intolerance    She is much better with the abdominal binder.  She is continued to have episodes however of syncope and also has falls and sometimes are difficult for her to distinguish.  She is being given a foot lift to try to prevent tripping.  Review of her diet shows volume depletion and sodium depletion.  In the past she has been hypertensive making salt repletion concern  She is struggling with the chronicity and the debility of her illness both his physical and increasingly psychological aspects   She has been having pains in her right and left flank with radiation to the midline.  There is no associated rash   Records and Results Reviewed   Past Medical History:  Diagnosis Date  . Anginal pain ([place])    chest pain  heart cath done  . Anxiety   . Cancer ([place])   . Chronic bronchitis ([place])    "get it q yr"  . Chronic inflammatory demyelinating polyneuropathy ([place])   . Early cataract    right  . Fibromyalgia   . GERD (gastroesophageal reflux disease)   . Hepatic adenoma   . High cholesterol   . History of gout   . History of hiatal hernia   . Hypertension   . Hypothyroidism   . IBS (irritable bowel syndrome)   . Iron deficiency anemia    "comes and goes"  . Kidney stones   . Migraine    "@ least once/month" (02/18/2015)  . Obesity   . PONV (postoperative nausea and vomiting)   . Proteinuria   . Spinal headache   . Syncope   . Tachycardia   . Type II diabetes mellitus ([place])    . Wears glasses     Past Surgical History:  Procedure Laterality Date  . ABDOMINAL HYSTERECTOMY  ~2012   "lap"  . ANKLE RECONSTRUCTION Left 03/29/2016   Procedure: LEFT ANKLE LATERAL LIGAMENT RECONSTRUCTION AND PERONEAL TENDON REPAIR OR TENOLYSIS;  Surgeon: Wylene Simmer, MD;  Location: [facility];  Service: Orthopedics;  Laterality: Left;  . BACK SURGERY    . CARDIAC CATHETERIZATION  04/2009   "clean"  . COLONOSCOPY    . CYSTOSCOPY WITH URETEROSCOPY, STONE BASKETRY AND STENT PLACEMENT  ~ 2006  . ESOPHAGOGASTRODUODENOSCOPY    . insulin pump     U 500  . LAPAROSCOPIC CHOLECYSTECTOMY  05/2004  . LUMBAR DISC SURGERY  08/2011  . SURAL NERVE BX Left 09/30/2017   Procedure: LEFT SURAL NERVE AND MUSCLE BIOPSY;  Surgeon: Newman Pies, MD;  Location: [facility];  Service: Neurosurgery;  Laterality: Left;  LEFT SURAL NERVE AND MUSCLE BIOPSY  . THYROIDECTOMY N/A 02/18/2015   Procedure: TOTAL THYROIDECTOMY;  Surgeon: [name], MD;  Location: [facility];  Service: General;  Laterality: N/A;  . TOTAL THYROIDECTOMY  02/18/2015    Current Outpatient Medications  Medication Sig Dispense Refill  . ALPRAZolam (XANAX) 0.5 MG tablet Take 0.5 mg by mouth every 6 (six) hours as needed for anxiety or sleep.     Marland Kitchen  ammonium lactate (LAC-HYDRIN) 12 % lotion Apply 1 application topically 3 (three) times daily as needed for dry skin.     Marland Kitchen aspirin EC 81 MG tablet Take 81 mg by mouth daily.    Marland Kitchen atorvastatin (LIPITOR) 20 MG tablet Take 20 mg by mouth daily at 6 PM.     . BAYER CONTOUR NEXT TEST test strip 1 each by Other route See admin instructions. Testing 3-6 times daily  4  . Biotin 10000 MCG TABS Take 10,000 mcg by mouth at bedtime.    . cetirizine (ZYRTEC) 10 MG tablet Take 10 mg by mouth daily as needed for allergies.    . clotrimazole-betamethasone (LOTRISONE) cream Apply 1 application topically 2 (two) times daily as needed. For skin folds  2  . cyanocobalamin (,VITAMIN B-12,) 1000 MCG/ML injection Inject 1,000  mcg into the muscle every 30 (thirty) days.    . cyclobenzaprine (FLEXERIL) 10 MG tablet Take 10 mg by mouth 3 (three) times daily as needed for muscle spasms (scheduled each night at bedtime).     . D3-50 50000 units capsule Take 50,000 Units by mouth every Saturday.  4  . Dermatological Products, Misc. (HYLATOPIC PLUS) CREA Apply 1 application topically 4 (four) times daily as needed (for hands irriation.).     Marland Kitchen dexlansoprazole (DEXILANT) 60 MG capsule Take 60 mg by mouth daily at 6 (six) AM.    . dicyclomine (BENTYL) 10 MG capsule Take 10 mg by mouth 2 (two) times daily.     Marland Kitchen doxycycline (PERIOSTAT) 20 MG tablet Take 20 mg by mouth 2 (two) times daily as needed. For rosacea.  3  . fluticasone (FLONASE) 50 MCG/ACT nasal spray Place 1 spray into the nose daily as needed (for allergies.). Reported on 12/27/2015    . furosemide (LASIX) 40 MG tablet Take 40 mg by mouth 2 (two) times daily.      Marland Kitchen gabapentin (NEURONTIN) 100 MG capsule Take 1 capsule (100 mg total) by mouth 3 (three) times daily. (Patient taking differently: Take 100-500 mg by mouth 3 (three) times daily as needed (for pain based on sedation levels). ) 90 capsule 5  . gabapentin (NEURONTIN) 300 MG capsule Take 2 capsules (600 mg total) by mouth 3 (three) times daily. (Patient taking differently: Take 600 mg by mouth 3 (three) times daily. (MAY HOLD AND TAKE 100 MG CAPSULES BASED ON SEDATION LEVEL)) 540 capsule 3  . halobetasol (ULTRAVATE) 0.05 % cream Apply 1 application topically 2 (two) times daily as needed (for finger blistering).     Marland Kitchen HYDROcodone-acetaminophen (NORCO) 7.5-325 MG tablet Take 1 tablet by mouth every 6 (six) hours as needed (for pain.).     Marland Kitchen hyoscyamine (LEVSIN SL) 0.125 MG SL tablet Place 0.25 mg under the tongue every 4 (four) hours.     Marland Kitchen ibuprofen (ADVIL,MOTRIN) 200 MG tablet Take 400 mg by mouth every 8 (eight) hours as needed (for pain.).     . Insulin Human (INSULIN PUMP) SOLN Inject into the skin.    Marland Kitchen  insulin regular human CONCENTRATED (HUMULIN R) 500 UNIT/ML SOLN injection Inject 2 Units into the skin See admin instructions. 2.0 units /hour 0500-2359, 1.95units 5053-9767 with carb ratio 1:50 for meal time bolus    . INVOKAMET 50-1000 MG TABS Take 1 tablet by mouth 2 (two) times daily.   5  . ketoconazole (NIZORAL) 2 % cream Apply 1 application topically 2 (two) times daily as needed. For tops of toes.    Marland Kitchen levothyroxine (SYNTHROID,  LEVOTHROID) 137 MCG tablet Take 274 mcg by mouth daily before breakfast.    . lisinopril (PRINIVIL,ZESTRIL) 20 MG tablet Take 40 mg by mouth 2 (two) times daily.    . metoprolol succinate (TOPROL-XL) 100 MG 24 hr tablet Take 100 mg by mouth 2 (two) times daily. Take with or immediately following a meal.    . metroNIDAZOLE (METROCREAM) 0.75 % cream Apply 1 application topically 2 (two) times daily as needed (for rosacea.).     Marland Kitchen Multiple Vitamins-Minerals (ADULT ONE DAILY GUMMIES PO) Take 2 tablets by mouth at bedtime.    . mupirocin ointment (BACTROBAN) 2 % Apply 1 application topically 2 (two) times daily as needed. To left toe ulcer.    . ondansetron (ZOFRAN-ODT) 8 MG disintegrating tablet Take 8 mg by mouth every 6 (six) hours as needed for nausea or vomiting.     . Oxymetazoline HCl (RHOFADE) 1 % CREA Apply 1 application topically daily as needed (for rosacea).     Marland Kitchen PROCTOSOL HC 2.5 % rectal cream Apply 1 application topically daily as needed for hemorrhoids.   2  . triamcinolone ointment (KENALOG) 0.1 % Apply 1 application topically 2 (two) times daily as needed (for finger blister).      No current facility-administered medications for this visit.     Allergies  Allergen Reactions  . Penicillins Anaphylaxis, Shortness Of Breath, Swelling and Palpitations    Has patient had a PCN reaction causing immediate rash, facial/tongue/throat swelling, SOB or lightheadedness with hypotension: Yes Has patient had a PCN reaction causing severe rash involving mucus  membranes or skin necrosis: No Has patient had a PCN reaction that required hospitalization No Has patient had a PCN reaction occurring within the last 10 years: No If all of the above answers are "NO", then may proceed with Cephalosporin use.   . Clarithromycin Nausea And Vomiting    Any antibiotic (doxyclycine, etc)  . Dilaudid [Hydromorphone Hcl] Nausea And Vomiting  . Adhesive [Tape] Itching    Redness  . Latex Itching and Rash    Blisters with prolonged contact  . Milk-Related Compounds Other (See Comments)    Patient preference, does not like  . Moxifloxacin Nausea And Vomiting    Needs Zofran or Phenergan      Review of Systems negative except from HPI and PMH  Physical Exam BP 128/72   Pulse (!) 104   Ht 5\' 6"  (1.676 m)   Wt 277 lb (125.6 kg)   SpO2 95%   BMI 44.71 kg/m  Morbidly obese But clearly uncomfortable HENT normal Neck supple with JVP-flat Clear Regular rate and rhythm, no murmurs or gallops Abd-soft with active BS No Clubbing cyanosis edema Skin-warm and dry A & Oriented  Grossly normal sensory and motor function     Assessment and  Plan   Orthostatic hypotension  Syncope  Diabetes  Morbid obesity  CIDP ( chronic inflammatory demyelinating polyneuropathy)  Hypertension     She struggles immensely.  We have reviewed the importance of volume repletion.  With her blood pressures been on the lower side have encouraged some degree of salt repletion.  She will have to make sure that her blood pressure did not become excessively elevated.  A brief literature search tonight suggest that there is scant if any interaction between CIDP and autonomic insufficiency.  I also do not find any association with  blood pressure  She is unable to exercise; she has been using a walker given her increasing confidence in ambulating  We spent more than 50% of our >25 min visit in face to face counseling regarding the above   Current medicines are  reviewed at length with the patient today .  The patient does not  have concerns regarding medicines.

## 2018-01-17 NOTE — Patient Instructions (Signed)
Medication Instructions:  Your physician has recommended you make the following change in your medication:   Begin taking a salt supplement  Labwork: None ordered.  Testing/Procedures: None ordered.  Follow-Up: Your physician recommends that you schedule a follow-up appointment in: 6 months with Dr Caryl Comes  Any Other Special Instructions Will Be Listed Below (If Applicable).  Continue wearing your abdominal binder    If you need a refill on your cardiac medications before your next appointment, please call your pharmacy.

## 2018-01-20 DIAGNOSIS — N181 Chronic kidney disease, stage 1: Secondary | ICD-10-CM | POA: Diagnosis not present

## 2018-01-20 DIAGNOSIS — I129 Hypertensive chronic kidney disease with stage 1 through stage 4 chronic kidney disease, or unspecified chronic kidney disease: Secondary | ICD-10-CM | POA: Diagnosis not present

## 2018-01-20 DIAGNOSIS — E118 Type 2 diabetes mellitus with unspecified complications: Secondary | ICD-10-CM | POA: Diagnosis not present

## 2018-01-20 DIAGNOSIS — R809 Proteinuria, unspecified: Secondary | ICD-10-CM | POA: Diagnosis not present

## 2018-01-21 NOTE — Progress Notes (Signed)
Subjective: [name] presents the office today for follow-up evaluation of a wound to the left hallux. Since she has been having to decrease her appointments with me due to cost she has been trying to file the callus with a pumice stone which is been helping.  She denies any drainage or pus or any swelling or redness.  In regards to the foot she has no other concerns.  She did can be quite a bit of the update about what is going on regards to her other medical conditions which she is seen other physicians for. Denies any systemic complaints such as fevers, chills, nausea, vomiting. No acute changes since last appointment, and no other complaints at this time.   Objective: AAO x3, NAD DP/PT pulses palpable bilaterally, CRT less than 3 seconds Protective sensation decreased with Simms Weinstein monofilament To the plantar aspect the left hallux continues to be ulceration with hyperkeratotic tissue along the wound.  After debridement the wound measures 0.2 x 0.2 and is superficial and almost an abrasion type lesion.  It appears to be about the same size compared to last appointment.  There is no probing, undermining or tunneling and there is no fluctuation or crepitation.  There is no malodor.  There is no surrounding erythema or ascending cellulitis.  There is no other open lesions or pre-ulcerative lesions.  There is no erythema to the navicular tuberosity or any other areas of skin irritation from the brace.   No pain with calf compression, swelling, warmth, erythema  Assessment: Ulceration left plantar hallux without signs of infection  Plan: -All treatment options discussed with the patient including all alternatives, risks, complications.  -The wound was sharply debrided today after verbal consent was obtained none healthy, granular tissue.  Silvadene was applied followed by dressing.  Continue with daily dressing changes.  Overall the wound is improving.  I also made her a buttress pad to help offload  the wound. -She is to follow-up with [facility] for the brace.  There is one area that is rubbing although there is no skin break, erythema about this point I try to do something to help offload this area. -I will see her back in 4 weeks or sooner if needed.  Encouraged to call any questions or concerns or any change in symptoms.  Trula Slade DPM

## 2018-01-23 ENCOUNTER — Telehealth: Payer: Self-pay | Admitting: Neurology

## 2018-01-23 NOTE — Telephone Encounter (Signed)
Left message for patient to call me back. 

## 2018-01-23 NOTE — Telephone Encounter (Signed)
Pt left a VM message saying she wanted to give Dr Posey Pronto and update on her IVIG treatments, please call

## 2018-01-27 DIAGNOSIS — E11621 Type 2 diabetes mellitus with foot ulcer: Secondary | ICD-10-CM | POA: Diagnosis not present

## 2018-01-27 DIAGNOSIS — Z1389 Encounter for screening for other disorder: Secondary | ICD-10-CM | POA: Diagnosis not present

## 2018-01-27 DIAGNOSIS — E1129 Type 2 diabetes mellitus with other diabetic kidney complication: Secondary | ICD-10-CM | POA: Diagnosis not present

## 2018-01-27 DIAGNOSIS — G6181 Chronic inflammatory demyelinating polyneuritis: Secondary | ICD-10-CM | POA: Diagnosis not present

## 2018-01-28 DIAGNOSIS — G6181 Chronic inflammatory demyelinating polyneuritis: Secondary | ICD-10-CM | POA: Diagnosis not present

## 2018-01-30 DIAGNOSIS — G6181 Chronic inflammatory demyelinating polyneuritis: Secondary | ICD-10-CM | POA: Diagnosis not present

## 2018-01-31 DIAGNOSIS — G6181 Chronic inflammatory demyelinating polyneuritis: Secondary | ICD-10-CM | POA: Diagnosis not present

## 2018-02-01 DIAGNOSIS — G6181 Chronic inflammatory demyelinating polyneuritis: Secondary | ICD-10-CM | POA: Diagnosis not present

## 2018-02-04 ENCOUNTER — Other Ambulatory Visit: Payer: BLUE CROSS/BLUE SHIELD

## 2018-02-14 ENCOUNTER — Ambulatory Visit (INDEPENDENT_AMBULATORY_CARE_PROVIDER_SITE_OTHER): Payer: BLUE CROSS/BLUE SHIELD | Admitting: Podiatry

## 2018-02-14 DIAGNOSIS — L03116 Cellulitis of left lower limb: Secondary | ICD-10-CM

## 2018-02-14 DIAGNOSIS — E1140 Type 2 diabetes mellitus with diabetic neuropathy, unspecified: Secondary | ICD-10-CM

## 2018-02-14 DIAGNOSIS — E1149 Type 2 diabetes mellitus with other diabetic neurological complication: Secondary | ICD-10-CM | POA: Diagnosis not present

## 2018-02-14 DIAGNOSIS — L97521 Non-pressure chronic ulcer of other part of left foot limited to breakdown of skin: Secondary | ICD-10-CM

## 2018-02-14 MED ORDER — DOXYCYCLINE HYCLATE 100 MG PO TABS: 100.0000 mg | ORAL_TABLET | Freq: Two times a day (BID) | ORAL | 0 refills | Status: DC

## 2018-02-17 NOTE — Progress Notes (Signed)
Subjective: [name] presents the office today for follow-up evaluation of a wound to the left hallux.  Earlier this week she did notice that there was a darkened area on the side of her toe feels that the wound is improving over some.  She denies any recent injury or trauma.  She says the brace is not rubbing that area so she is not sure what started this.  She denies any drainage or pus From the toe she has noticed some mild swelling to the toe but denies any swelling to the foot or any increase in pain.  No recent injury.  Since I last saw her she did recently get her first treatment with IVIG.  Denies any systemic complaints such as fevers, chills, nausea, vomiting. No acute changes since last appointment, and no other complaints at this time.   Objective: AAO x3, NAD DP/PT pulses palpable bilaterally, CRT less than 3 seconds Protective sensation decreased with Simms Weinstein monofilament On the plantar aspect left hallux is a hyperkeratotic lesion.  The wound appears to be almost healed at this time however after debridement the wound continues measuring 0.3 x 0.2 x 0.2 cm.  Upon initial debridement of the hyperkeratotic tissue overlying the wound there was a very small amount of purulence identified.  There is no further purulence identified after debridement.  There is no probing to bone, undermining or tunneling.  On the medial aspect of the hallux there is evidence of dried blood and almost appears to be an old blister.  There is mild swelling to the toe but there is no significant erythema there is no ascending cellulitis.  There is no fluctuation or crepitation.  There is no malodor. There is no irritation from the brace.  She stopped wearing the brace about 2 hours a day.  She does state that the brace is helping quite a bit as far as her walking. No pain with calf compression, swelling, warmth, erythema  Assessment: Ulceration left plantar hallux with localized infection  Plan: -All  treatment options discussed with the patient including all alternatives, risks, complications.  -I did sharply debride the wound today without any complications.  Very minimal purulence identified to the area.  Given her increasing swelling as well as the findings today will start antibiotics I prescribed doxycycline. -The wound was cleaned.  Antibiotic ointment and a bandage was applied.  Continue daily dressing changes. -Offloading pads were dispensed. -Monitor for any clinical signs or symptoms of infection and directed to call the office immediately should any occur or go to the ER. -Follow-up in 10 days or sooner if needed.  Call any questions or concerns meantime.  She agrees with this plan today and has no further questions.  Trula Slade DPM

## 2018-02-19 DIAGNOSIS — Z1389 Encounter for screening for other disorder: Secondary | ICD-10-CM | POA: Diagnosis not present

## 2018-02-19 DIAGNOSIS — E11621 Type 2 diabetes mellitus with foot ulcer: Secondary | ICD-10-CM | POA: Diagnosis not present

## 2018-02-19 DIAGNOSIS — E890 Postprocedural hypothyroidism: Secondary | ICD-10-CM | POA: Diagnosis not present

## 2018-02-19 DIAGNOSIS — C73 Malignant neoplasm of thyroid gland: Secondary | ICD-10-CM | POA: Diagnosis not present

## 2018-02-19 DIAGNOSIS — K760 Fatty (change of) liver, not elsewhere classified: Secondary | ICD-10-CM | POA: Diagnosis not present

## 2018-02-21 ENCOUNTER — Encounter: Payer: Self-pay | Admitting: Neurology

## 2018-02-21 ENCOUNTER — Ambulatory Visit (INDEPENDENT_AMBULATORY_CARE_PROVIDER_SITE_OTHER): Payer: BLUE CROSS/BLUE SHIELD | Admitting: Neurology

## 2018-02-21 VITALS — BP 120/80 | HR 97 | Ht 66.0 in | Wt 279.2 lb

## 2018-02-21 DIAGNOSIS — G6181 Chronic inflammatory demyelinating polyneuritis: Secondary | ICD-10-CM

## 2018-02-21 NOTE — Patient Instructions (Addendum)
Start home physical therapy and occupational therapy  Continue IVIG every 4 weeks  Return to clinic in June 21st at 3:30pm.

## 2018-02-21 NOTE — Progress Notes (Signed)
Follow-up Visit   Date: 02/21/18    [name] MRN: [id number] DOB: [birth year]   Interim History: [name] is a 52 y.o. right-handed Caucasian female with insulin-dependent diabetes mellitus, thyroid cancer s/p resection secondary hypothyroidism (2016), hypertension, hyperlipidemia, GERD, s/p left L5 hemilaminectomy and discectomy, dysautonomia, chronic left ankle pain returning for a follow-up of chronic inflammatory demyelinating polyradiculoneuropathy.  History of present illness: In 2012, she underwent left L5 hemilaminectomy and discectomy by Dr. Arnoldo Morale for severe radicular left leg and foot pain with foot drop. Pain was significantly improved post-op and over the next few months, she was able to extend her toes and foot.  She has residual reduced sensation of the left great toe.    Starting in 2016, she had a spell of night sweats and burning sensation of the body.  She also has SOB and tachycardia and was later found to have thyroid cancer.  She also was treated with radioactive iodine later because her margins were not clean.  By June 2016, she was feeling very fatigued and thought it was due to deconditioning so started walking, but then developed left knee pain.  She attributed this to being overweight and sedentary, so started to walk, but developed left hip pain and left foot pain. Ankle pain was deep and throbbing. She was recommended to immobilize the foot due to possible stress fracture.  She saw Dr. Doran Durand, orthopaedics, who recommended that she wear a cast.  She recalls it being better while wearing a cast, but never completely resolved. Once the cast was removed, she realized that she had weakness of the foot with eversion, inversion, and dorsiflexion. She failed PT, bracing, pain meds and immobilization and underwent left ankle surgery with ligament reconstruction and tendonlysis for instability and tendonitis in April 2017.  Unfortuantely, this  did not improve her pain and she was referred to pain management for complex regional pain syndrome, however they did not feel that she had this.  She did physical therapy and was able to improve left dorsiflexion some.  Because of ongoing deep, throbbing pain of the left ankle, she was referred for NCS/EMG which showed acute on chronic sensorimotor polyneuropathy affecting both legs, worse on the right.  She underwent CSF and serology testing which showed normal CSF protein.  Labs notable for vitamin B1 and B12 deficiency.   Around summer 2017, she started having new low back pain.  Pain is sharp and localized to her low back and radiates into her left buttocks, but does not radiate into her leg.  She saw Dr. Arnoldo Morale again and MRI lumbar spine which showed small L3-4 disc protrusion near the left L4 nerve root.  Post-op changes at left L5-S1 was stable.  Later in 2017, she also began having new burning and tingling sensation of the hands as well as left hip flexion weakness. She has not had any new falls and walks with a cane as needed.  She is getting fit for a left AFO.  She was evaluated by Dr. Caryl Comes for syncope and diagnosed with dysautonomia. She is compliant with abdominal binder.    UPDATE 12/23/2017:  She underwent left sural nerve biopsy by Dr. Arnoldo Morale in October 2018 which confirmed CIDP. We have been in the process of trying to get IVIG approved.   She is having greater difficulty with reaching for objects overhead and keeping her arms raised.  She also complains of new issues with bladder incontinence and some bowel incontinence.  She  has had 11 falls since her last visit, some of which are syncopal spells.  Her weakness of the arms and legs are progressed, but tingling/burning pain is well controlled on gabapentin 667m TID.  UPDATE 02/21/2018:  She is here for follow-up visit.  She received her induction dose of IVIG in February and had severe headache with each dose, despite taking tylenol.   She has suffered 3 falls since her last visit, all which have occurred when she was using a cane, not her walker.   She has noticed increased numbness of the right foot, as if her foot a "sponge" which worries her.  She has not appreciated any improvement or weakness since her last visit.   Medications:  Current Outpatient Medications on File Prior to Visit  Medication Sig Dispense Refill  . ALPRAZolam (XANAX) 0.5 MG tablet Take 0.5 mg by mouth every 6 (six) hours as needed for anxiety or sleep.     .Marland Kitchenammonium lactate (LAC-HYDRIN) 12 % lotion Apply 1 application topically 3 (three) times daily as needed for dry skin.     .Marland Kitchenaspirin EC 81 MG tablet Take 81 mg by mouth daily.    .Marland Kitchenatorvastatin (LIPITOR) 20 MG tablet Take 20 mg by mouth daily at 6 PM.     . BAYER CONTOUR NEXT TEST test strip 1 each by Other route See admin instructions. Testing 3-6 times daily  4  . Biotin 10000 MCG TABS Take 10,000 mcg by mouth at bedtime.    . cetirizine (ZYRTEC) 10 MG tablet Take 10 mg by mouth daily as needed for allergies.    . clotrimazole-betamethasone (LOTRISONE) cream Apply 1 application topically 2 (two) times daily as needed. For skin folds  2  . cyanocobalamin (,VITAMIN B-12,) 1000 MCG/ML injection Inject 1,000 mcg into the muscle every 30 (thirty) days.    . cyclobenzaprine (FLEXERIL) 10 MG tablet Take 10 mg by mouth 3 (three) times daily as needed for muscle spasms (scheduled each night at bedtime).     . D3-50 50000 units capsule Take 50,000 Units by mouth every Saturday.  4  . Dermatological Products, Misc. (HYLATOPIC PLUS) CREA Apply 1 application topically 4 (four) times daily as needed (for hands irriation.).     .Marland Kitchendexlansoprazole (DEXILANT) 60 MG capsule Take 60 mg by mouth daily at 6 (six) AM.    . dicyclomine (BENTYL) 10 MG capsule Take 10 mg by mouth 2 (two) times daily.     .Marland Kitchendoxycycline (PERIOSTAT) 20 MG tablet Take 20 mg by mouth 2 (two) times daily as needed. For rosacea.  3  . doxycycline  (VIBRA-TABS) 100 MG tablet Take 1 tablet (100 mg total) by mouth 2 (two) times daily. 20 tablet 0  . fluticasone (FLONASE) 50 MCG/ACT nasal spray Place 1 spray into the nose daily as needed (for allergies.). Reported on 12/27/2015    . furosemide (LASIX) 40 MG tablet Take 40 mg by mouth 2 (two) times daily.      .Marland Kitchengabapentin (NEURONTIN) 300 MG capsule Take 2 capsules (600 mg total) by mouth 3 (three) times daily. (Patient taking differently: Take 600 mg by mouth 3 (three) times daily. (MAY HOLD AND TAKE 100 MG CAPSULES BASED ON SEDATION LEVEL)) 540 capsule 3  . halobetasol (ULTRAVATE) 0.05 % cream Apply 1 application topically 2 (two) times daily as needed (for finger blistering).     .Marland KitchenHYDROcodone-acetaminophen (NORCO) 7.5-325 MG tablet Take 1 tablet by mouth every 6 (six) hours as needed (for pain.).     .Marland Kitchen  hyoscyamine (LEVSIN SL) 0.125 MG SL tablet Place 0.25 mg under the tongue every 4 (four) hours.     Marland Kitchen ibuprofen (ADVIL,MOTRIN) 200 MG tablet Take 400 mg by mouth every 8 (eight) hours as needed (for pain.).     . Insulin Human (INSULIN PUMP) SOLN Inject into the skin.    Marland Kitchen insulin regular human CONCENTRATED (HUMULIN R) 500 UNIT/ML SOLN injection Inject 2 Units into the skin See admin instructions. 2.0 units /hour 0500-2359, 1.95units 6503-5465 with carb ratio 1:50 for meal time bolus    . INVOKAMET 50-1000 MG TABS Take 1 tablet by mouth 2 (two) times daily.   5  . ketoconazole (NIZORAL) 2 % cream Apply 1 application topically 2 (two) times daily as needed. For tops of toes.    Marland Kitchen levothyroxine (SYNTHROID, LEVOTHROID) 137 MCG tablet Take 274 mcg by mouth daily before breakfast.    . lisinopril (PRINIVIL,ZESTRIL) 20 MG tablet Take 40 mg by mouth 2 (two) times daily.    . metoprolol succinate (TOPROL-XL) 100 MG 24 hr tablet Take 100 mg by mouth 2 (two) times daily. Take with or immediately following a meal.    . metroNIDAZOLE (METROCREAM) 0.75 % cream Apply 1 application topically 2 (two) times daily  as needed (for rosacea.).     Marland Kitchen Multiple Vitamins-Minerals (ADULT ONE DAILY GUMMIES PO) Take 2 tablets by mouth at bedtime.    . mupirocin ointment (BACTROBAN) 2 % Apply 1 application topically 2 (two) times daily as needed. To left toe ulcer.    . ondansetron (ZOFRAN-ODT) 8 MG disintegrating tablet Take 8 mg by mouth every 6 (six) hours as needed for nausea or vomiting.     . Oxymetazoline HCl (RHOFADE) 1 % CREA Apply 1 application topically daily as needed (for rosacea).     Marland Kitchen PROCTOSOL HC 2.5 % rectal cream Apply 1 application topically daily as needed for hemorrhoids.   2  . triamcinolone ointment (KENALOG) 0.1 % Apply 1 application topically 2 (two) times daily as needed (for finger blister).      No current facility-administered medications on file prior to visit.     Allergies:  Allergies  Allergen Reactions  . Penicillins Anaphylaxis, Shortness Of Breath, Swelling and Palpitations    Has patient had a PCN reaction causing immediate rash, facial/tongue/throat swelling, SOB or lightheadedness with hypotension: Yes Has patient had a PCN reaction causing severe rash involving mucus membranes or skin necrosis: No Has patient had a PCN reaction that required hospitalization No Has patient had a PCN reaction occurring within the last 10 years: No If all of the above answers are "NO", then may proceed with Cephalosporin use.   . Clarithromycin Nausea And Vomiting    Any antibiotic (doxyclycine, etc)  . Dilaudid [Hydromorphone Hcl] Nausea And Vomiting  . Adhesive [Tape] Itching    Redness  . Latex Itching and Rash    Blisters with prolonged contact  . Milk-Related Compounds Other (See Comments)    Patient preference, does not like  . Moxifloxacin Nausea And Vomiting    Needs Zofran or Phenergan    Review of Systems:  CONSTITUTIONAL: No fevers, chills, night sweats, or weight loss.  EYES: No visual changes or eye pain ENT: No hearing changes.  No history of nose bleeds.     RESPIRATORY: No cough, wheezing and shortness of breath.   CARDIOVASCULAR: Negative for chest pain, and palpitations.   GI: Negative for abdominal discomfort, blood in stools or black stools.  No recent change in  bowel habits.   GU:  No history of incontinence.   MUSCLOSKELETAL: No history of joint pain or swelling.  +myalgias.  +weakness SKIN: Negative for lesions, rash, and itching.   ENDOCRINE: Negative for cold or heat intolerance, polydipsia or goiter.   PSYCH:  No  depression or anxiety symptoms.   NEURO: As Above.   Vital Signs:  BP 120/80   Pulse 97   Ht '5\' 6"'$  (1.676 m)   Wt 279 lb 4 oz (126.7 kg)   SpO2 95%   BMI 45.07 kg/m   Neurological Exam: MENTAL STATUS including orientation to time, place, person, recent and remote memory, attention span and concentration, language, and fund of knowledge is normal.  Speech is not dysarthric.  CRANIAL NERVES:  Pupils are round and reactive to light.  Extraocular muscles are intact.  Face is symmetric.  Palate elevates symmetrically.   MOTOR: Tone is normal.  There is some atrophy of the intrinsic hand muscles.    Right Upper Extremity:    Left Upper Extremity:    Deltoid  4+/5   Deltoid  4+/5   Biceps  4+/5   Biceps  4+/5   Triceps  5-/5   Triceps  5-/5   Wrist extensors  4+/5   Wrist extensors  4+/5   Wrist flexors  4-/5   Wrist flexors  4-/5   Finger extensors  4-/5   Finger extensors  4-/5   Finger flexors  4-/5   Finger flexors  4-/5   Dorsal interossei  3+/5   Dorsal interossei  3+/5   Abductor pollicis  4/5   Abductor pollicis  4/5   Tone (Ashworth scale)  0  Tone (Ashworth scale)  0   Right Lower Extremity:    Left Lower Extremity:    Hip flexors  4+/5   Hip flexors  4-/5   Hip extensors  5/5   Hip extensors  5/5   Knee flexors  5/5   Knee flexors  4/5   Knee extensors          5-/5 Inversion 5/5  inversion 2/5  Eversion 5/5  Eversion 2/5  Dorsiflexors  4/5   Dorsiflexors  4/5    Plantarflexors  4/5   Plantarflexors  4/5   Toe extensors  4-/5   Toe extensors  1/5   Toe flexors  4-/5   Toe flexors  1/5   Tone (Ashworth scale)  0  Tone (Ashworth scale)  0    MSRs:    Right                                                                 Left brachioradialis 2+  brachioradialis 2+  biceps 2+  biceps 2+  triceps 2+  triceps 2+  patellar 3+  patellar 3+  ankle jerk 0  ankle jerk 0  Hoffman no  Hoffman no  plantar response down  plantar response down   SENSORY:  Vibration 50% at the knees bilaterally, absent at the ankles; Temperature and pin prick is reduced over the hands and below the knees.   COORDINATION/GAIT:  Gait appears very unsteady with cane, slow, and waddling  Data: NCS/EMG of the arms 01/29/2017: The electrophysiologic testing is most consistent with an active on chronic,  distal and symmetric sensorimotor polyneuropathy, demyelinating and axon loss in type. Overall, these findings are severe in degree electrically.   In addition, the presence of temporal dispersion suggests an acquired condition.  NCS/EMG of the legs 01/10/2017:    The electrophysiologic findings are most consistent with a subacute sensorimotor polyneuropathy, axon loss in type, affecting the lower extremities and worse on the left. Overall, these findings are severe in degree electrically.  Labs 01/25/2017:  ESR 10, CRP 0.3, vitamin B12 256, ACE 5, MMA 231, ANA neg, SSA/B neg, copper 92, vitamin B1 6*, SPEP with IFE no M protein  CSF 02/19/2017:  R1200, W0 G76 P37  ACE 10, IgG index 0.47, Lyme neg, cytology neg, no OCB  MRI lumbar spine wwo contrast 02/28/2017: 1. Stable appearance of the lumbar spine. No acute abnormality identified. 2. Postoperative changes on the left L5-S1 without residual stenosis. 3. Small left subarticular disc protrusion at L3-4 encroaching upon the left lateral recess, and potentially affecting the descending left L4 nerve root. 4. Mild bilateral  foraminal stenosis at L4-5 related to disc bulge and facet degeneration.  MRI cervical spine wwo contrast 09/09/2017: 1. Normal MRI appearance of the cervical spinal cord. No cord signal abnormality is identified. 2. Multilobulated disc protrusion at C4-5, flattening the right hemi cord without cord signal changes. 3. Broad posterior disc bulge at C6-7 with resultant mild canal and left C7 foraminal stenosis. 4. Central disc protrusion at C3-4 with resultant mild spinal stenosis. No significant cord deformity.  MRI brain wwo contrast 09/09/2017:  Normal MRI of the brain. No acute intracranial abnormality identified.  Left sural nerve biopsy performed on 10/22 shows chronic (predominant) and acute loss of large myelinated fibers with possible demyelination/remyelination process consistent with prior diagnosis of chronic inflammatory demyelinating poly-radicular neuropathy; marked thickening of the endoneutrial vessels consistent with diabetic neuropathy, clinical correlation recommended.   Left vastus lateralis muscle biopsy: 2 isolated foci of very mild perivascular, possibly vascular, inflammation, and mild nonspecific mild myofiber atrophy and skeletal muscle example without myofiber necrosis.   IMPRESSION/PLAN: 1.  Chronic inflammatory polyradiculoneuropathy with distal weakness and sensory loss (worse on the left), dysautonomia.  This was ultimately diagnosed by sural nerve biopsy (09/2017); CSF testing was normal, however NCS/EMG was highly suggestive of a subacute polyneuropathy.  On exam, she continues to have weakness and sensory loss over the arms and legs, worse distally.  There is slight improvement of proximal arm strength.  She completed induction dose of IVIG in February and will be continuing IVIG 1g/kg every 4 weeks, scheduled for next week.  Fortunately, she is getting financial assistance support for this medication. For her painful paresthesias, continue 650m TID.  Start home  PT and occupational therapy  2.  Chronic low back pain.  Continue flexeril 542mat bedtime.  Return to clinic in 3 months  Greater than 50% of this 30 minute visit was spent in counseling, explanation of diagnosis, planning of further management, and coordination of care.   Thank you for allowing me to participate in patient's care.  If I can answer any additional questions, I would be pleased to do so.    Sincerely,    Tykel Badie K. PaPosey ProntoDO

## 2018-02-26 ENCOUNTER — Encounter: Payer: Self-pay | Admitting: Gastroenterology

## 2018-02-26 DIAGNOSIS — E1129 Type 2 diabetes mellitus with other diabetic kidney complication: Secondary | ICD-10-CM | POA: Diagnosis not present

## 2018-02-26 DIAGNOSIS — Z794 Long term (current) use of insulin: Secondary | ICD-10-CM | POA: Diagnosis not present

## 2018-02-26 DIAGNOSIS — N08 Glomerular disorders in diseases classified elsewhere: Secondary | ICD-10-CM | POA: Diagnosis not present

## 2018-02-26 DIAGNOSIS — E11621 Type 2 diabetes mellitus with foot ulcer: Secondary | ICD-10-CM | POA: Diagnosis not present

## 2018-02-27 DIAGNOSIS — G6181 Chronic inflammatory demyelinating polyneuritis: Secondary | ICD-10-CM | POA: Diagnosis not present

## 2018-03-04 ENCOUNTER — Ambulatory Visit: Payer: BLUE CROSS/BLUE SHIELD | Admitting: Podiatry

## 2018-03-07 ENCOUNTER — Ambulatory Visit: Payer: Self-pay | Admitting: Neurology

## 2018-03-10 ENCOUNTER — Telehealth: Payer: Self-pay | Admitting: Gastroenterology

## 2018-03-10 NOTE — Telephone Encounter (Signed)
Patient wants a refill on her hyoscyamine, would you like to give her any? If so how many and directions?

## 2018-03-11 MED ORDER — HYOSCYAMINE SULFATE 0.125 MG SL SUBL: SUBLINGUAL_TABLET | SUBLINGUAL | 2 refills | Status: DC

## 2018-03-11 NOTE — Telephone Encounter (Signed)
Levsin 0.125 mg s/l 1-2 tabs every 6 hrs prn #120 2 refiils

## 2018-03-11 NOTE — Telephone Encounter (Signed)
Sent medication to pharmacy.   

## 2018-03-14 DIAGNOSIS — N08 Glomerular disorders in diseases classified elsewhere: Secondary | ICD-10-CM | POA: Diagnosis not present

## 2018-03-14 DIAGNOSIS — E11621 Type 2 diabetes mellitus with foot ulcer: Secondary | ICD-10-CM | POA: Diagnosis not present

## 2018-03-14 DIAGNOSIS — E1129 Type 2 diabetes mellitus with other diabetic kidney complication: Secondary | ICD-10-CM | POA: Diagnosis not present

## 2018-03-14 DIAGNOSIS — I10 Essential (primary) hypertension: Secondary | ICD-10-CM | POA: Diagnosis not present

## 2018-03-14 DIAGNOSIS — G6181 Chronic inflammatory demyelinating polyneuritis: Secondary | ICD-10-CM | POA: Diagnosis not present

## 2018-03-14 DIAGNOSIS — Z1389 Encounter for screening for other disorder: Secondary | ICD-10-CM | POA: Diagnosis not present

## 2018-03-14 DIAGNOSIS — E1142 Type 2 diabetes mellitus with diabetic polyneuropathy: Secondary | ICD-10-CM | POA: Diagnosis not present

## 2018-03-19 DIAGNOSIS — E65 Localized adiposity: Secondary | ICD-10-CM | POA: Diagnosis not present

## 2018-03-19 DIAGNOSIS — R2233 Localized swelling, mass and lump, upper limb, bilateral: Secondary | ICD-10-CM | POA: Diagnosis not present

## 2018-03-23 DIAGNOSIS — M25462 Effusion, left knee: Secondary | ICD-10-CM | POA: Diagnosis not present

## 2018-03-23 DIAGNOSIS — S79912A Unspecified injury of left hip, initial encounter: Secondary | ICD-10-CM | POA: Diagnosis not present

## 2018-03-23 DIAGNOSIS — M2392 Unspecified internal derangement of left knee: Secondary | ICD-10-CM | POA: Diagnosis not present

## 2018-03-26 ENCOUNTER — Telehealth: Payer: Self-pay | Admitting: Neurology

## 2018-03-26 DIAGNOSIS — M25562 Pain in left knee: Secondary | ICD-10-CM | POA: Diagnosis not present

## 2018-03-26 NOTE — Telephone Encounter (Signed)
Called patient back and left message that I am trying to work on getting her an appointment for a wheelchair evaluation.

## 2018-03-26 NOTE — Telephone Encounter (Signed)
Does she qualify for a power chair?  If not, I will let her know to get a manual one so that insurance will later pay for power chair if needed.

## 2018-03-26 NOTE — Telephone Encounter (Signed)
Patient called and said that she fell this past Friday and went to the ER. She has de arrangement of her Left Knee also badly sprained and a torn ligament. She said her Right leg has been weak as well. She is scheduled for [facility] on 04/02/18 . She is needing to see if she can get a wheelchair. She is having a hard time lifting up from anything. Please Call. Thanks

## 2018-03-26 NOTE — Telephone Encounter (Signed)
Recommend getting a wheelchair assessment and see what they recommend based on her neurological deficits.

## 2018-03-27 DIAGNOSIS — G6181 Chronic inflammatory demyelinating polyneuritis: Secondary | ICD-10-CM | POA: Diagnosis not present

## 2018-03-28 DIAGNOSIS — G6181 Chronic inflammatory demyelinating polyneuritis: Secondary | ICD-10-CM | POA: Diagnosis not present

## 2018-04-02 ENCOUNTER — Other Ambulatory Visit: Payer: Self-pay

## 2018-04-02 ENCOUNTER — Encounter (HOSPITAL_COMMUNITY): Payer: Self-pay | Admitting: *Deleted

## 2018-04-02 DIAGNOSIS — M25562 Pain in left knee: Secondary | ICD-10-CM | POA: Diagnosis not present

## 2018-04-02 NOTE — Progress Notes (Signed)
Spoke with pt for pre-op call. Pt states she had an EKG to show "an infarct, age undetermined" in 2010. States she had a cath done and it was "clean". Pt is a type 1 diabetic, on an Insulin pump using U500 insulin. Pt states her fasting blood sugar is usually between 70-115. States her most recent A1C was 7.2 on 03/14/18. Instructed pt to reduce her Insulin pump basal rate by 20% at midnight tonight. If blood sugar is >220 take 1/2 of usual correction dose of U500 insulin. If blood sugar is 70 or below, treat with 1/2 cup of clear juice (apple or cranberry) and recheck blood sugar 15 minutes after drinking juice. If blood sugar continues to be 70 or below, call the Short Stay department and ask to speak to a nurse. Pt voiced understanding.

## 2018-04-03 ENCOUNTER — Encounter (HOSPITAL_COMMUNITY)
Admission: RE | Disposition: A | Discharge status: Home Health | Payer: Self-pay | Source: Ambulatory Visit | Attending: Orthopedic Surgery

## 2018-04-03 ENCOUNTER — Ambulatory Visit (HOSPITAL_COMMUNITY): Payer: BLUE CROSS/BLUE SHIELD | Admitting: Anesthesiology

## 2018-04-03 ENCOUNTER — Other Ambulatory Visit: Payer: Self-pay

## 2018-04-03 ENCOUNTER — Encounter (HOSPITAL_COMMUNITY): Payer: Self-pay | Admitting: *Deleted

## 2018-04-03 ENCOUNTER — Observation Stay (HOSPITAL_COMMUNITY)
Admission: RE | Admit: 2018-04-03 | Discharge: 2018-04-05 | Disposition: A | Discharge status: Home Health | Payer: BLUE CROSS/BLUE SHIELD | Source: Ambulatory Visit | Attending: Orthopedic Surgery | Admitting: Orthopedic Surgery

## 2018-04-03 DIAGNOSIS — Z6841 Body Mass Index (BMI) 40.0 and over, adult: Secondary | ICD-10-CM | POA: Diagnosis not present

## 2018-04-03 DIAGNOSIS — E7800 Pure hypercholesterolemia, unspecified: Secondary | ICD-10-CM | POA: Diagnosis not present

## 2018-04-03 DIAGNOSIS — Z794 Long term (current) use of insulin: Secondary | ICD-10-CM | POA: Insufficient documentation

## 2018-04-03 DIAGNOSIS — E119 Type 2 diabetes mellitus without complications: Secondary | ICD-10-CM | POA: Insufficient documentation

## 2018-04-03 DIAGNOSIS — Z85850 Personal history of malignant neoplasm of thyroid: Secondary | ICD-10-CM | POA: Diagnosis not present

## 2018-04-03 DIAGNOSIS — M009 Pyogenic arthritis, unspecified: Secondary | ICD-10-CM | POA: Insufficient documentation

## 2018-04-03 DIAGNOSIS — Z79899 Other long term (current) drug therapy: Secondary | ICD-10-CM | POA: Insufficient documentation

## 2018-04-03 DIAGNOSIS — L03116 Cellulitis of left lower limb: Secondary | ICD-10-CM | POA: Diagnosis not present

## 2018-04-03 DIAGNOSIS — F419 Anxiety disorder, unspecified: Secondary | ICD-10-CM | POA: Diagnosis not present

## 2018-04-03 DIAGNOSIS — Z91040 Latex allergy status: Secondary | ICD-10-CM | POA: Diagnosis not present

## 2018-04-03 DIAGNOSIS — M659 Synovitis and tenosynovitis, unspecified: Secondary | ICD-10-CM | POA: Diagnosis not present

## 2018-04-03 DIAGNOSIS — F329 Major depressive disorder, single episode, unspecified: Secondary | ICD-10-CM | POA: Insufficient documentation

## 2018-04-03 DIAGNOSIS — M65862 Other synovitis and tenosynovitis, left lower leg: Secondary | ICD-10-CM | POA: Insufficient documentation

## 2018-04-03 DIAGNOSIS — M01X62 Direct infection of left knee in infectious and parasitic diseases classified elsewhere: Secondary | ICD-10-CM | POA: Diagnosis not present

## 2018-04-03 DIAGNOSIS — E039 Hypothyroidism, unspecified: Secondary | ICD-10-CM | POA: Insufficient documentation

## 2018-04-03 DIAGNOSIS — Z888 Allergy status to other drugs, medicaments and biological substances status: Secondary | ICD-10-CM | POA: Diagnosis not present

## 2018-04-03 DIAGNOSIS — I10 Essential (primary) hypertension: Secondary | ICD-10-CM | POA: Insufficient documentation

## 2018-04-03 DIAGNOSIS — M00862 Arthritis due to other bacteria, left knee: Principal | ICD-10-CM | POA: Insufficient documentation

## 2018-04-03 DIAGNOSIS — Z9641 Presence of insulin pump (external) (internal): Secondary | ICD-10-CM | POA: Insufficient documentation

## 2018-04-03 DIAGNOSIS — J449 Chronic obstructive pulmonary disease, unspecified: Secondary | ICD-10-CM | POA: Insufficient documentation

## 2018-04-03 DIAGNOSIS — Z8249 Family history of ischemic heart disease and other diseases of the circulatory system: Secondary | ICD-10-CM | POA: Insufficient documentation

## 2018-04-03 DIAGNOSIS — Z7982 Long term (current) use of aspirin: Secondary | ICD-10-CM | POA: Diagnosis not present

## 2018-04-03 DIAGNOSIS — Z880 Allergy status to penicillin: Secondary | ICD-10-CM | POA: Diagnosis not present

## 2018-04-03 DIAGNOSIS — B9561 Methicillin susceptible Staphylococcus aureus infection as the cause of diseases classified elsewhere: Secondary | ICD-10-CM | POA: Diagnosis not present

## 2018-04-03 DIAGNOSIS — G6181 Chronic inflammatory demyelinating polyneuritis: Secondary | ICD-10-CM | POA: Insufficient documentation

## 2018-04-03 DIAGNOSIS — M797 Fibromyalgia: Secondary | ICD-10-CM | POA: Diagnosis not present

## 2018-04-03 DIAGNOSIS — K589 Irritable bowel syndrome without diarrhea: Secondary | ICD-10-CM | POA: Diagnosis not present

## 2018-04-03 DIAGNOSIS — Z881 Allergy status to other antibiotic agents status: Secondary | ICD-10-CM | POA: Insufficient documentation

## 2018-04-03 DIAGNOSIS — K219 Gastro-esophageal reflux disease without esophagitis: Secondary | ICD-10-CM | POA: Insufficient documentation

## 2018-04-03 HISTORY — DX: Personal history of urinary calculi: Z87.442

## 2018-04-03 HISTORY — DX: Cardiac murmur, unspecified: R01.1

## 2018-04-03 HISTORY — PX: IRRIGATION AND DEBRIDEMENT KNEE: SHX5185

## 2018-04-03 HISTORY — DX: Chronic kidney disease, unspecified: N18.9

## 2018-04-03 HISTORY — DX: Dyspnea, unspecified: R06.00

## 2018-04-03 LAB — CBC
HCT: 36.3 % (ref 36.0–46.0)
Hemoglobin: 11.7 g/dL — ABNORMAL LOW (ref 12.0–15.0)
MCH: 29.4 pg (ref 26.0–34.0)
MCHC: 32.2 g/dL (ref 30.0–36.0)
MCV: 91.2 fL (ref 78.0–100.0)
Platelets: 435 10*3/uL — ABNORMAL HIGH (ref 150–400)
RBC: 3.98 MIL/uL (ref 3.87–5.11)
RDW: 14.2 % (ref 11.5–15.5)
WBC: 9.7 10*3/uL (ref 4.0–10.5)

## 2018-04-03 LAB — BASIC METABOLIC PANEL
Anion gap: 13 (ref 5–15)
BUN: 11 mg/dL (ref 6–20)
CO2: 24 mmol/L (ref 22–32)
Calcium: 9 mg/dL (ref 8.9–10.3)
Chloride: 98 mmol/L — ABNORMAL LOW (ref 101–111)
Creatinine, Ser: 0.82 mg/dL (ref 0.44–1.00)
GFR calc Af Amer: >60 mL/min (ref >60)
GFR calc non Af Amer: >60 mL/min (ref >60)
Glucose, Bld: 73 mg/dL (ref 65–99)
Potassium: 4.1 mmol/L (ref 3.5–5.1)
Sodium: 135 mmol/L (ref 135–145)

## 2018-04-03 LAB — GLUCOSE, CAPILLARY
Glucose-Capillary: 78 mg/dL (ref 65–99)
Glucose-Capillary: 70 mg/dL (ref 65–99)
Glucose-Capillary: 102 mg/dL — ABNORMAL HIGH (ref 65–99)
Glucose-Capillary: 228 mg/dL — ABNORMAL HIGH (ref 65–99)

## 2018-04-03 LAB — SURGICAL PCR SCREEN
MRSA, PCR: NEGATIVE
Staphylococcus aureus: POSITIVE — AB

## 2018-04-03 LAB — HEMOGLOBIN A1C
Hgb A1c MFr Bld: 6.8 % — ABNORMAL HIGH (ref 4.8–5.6)
Mean Plasma Glucose: 148.46 mg/dL

## 2018-04-03 SURGERY — IRRIGATION AND DEBRIDEMENT KNEE
Anesthesia: General | Site: Knee | Laterality: Left

## 2018-04-03 MED ORDER — ONDANSETRON HCL 4 MG/2ML IJ SOLN
INTRAMUSCULAR | Status: DC | PRN
  Administered 2018-04-03: 4 mg via INTRAVENOUS

## 2018-04-03 MED ORDER — ROCURONIUM BROMIDE 10 MG/ML (PF) SYRINGE
PREFILLED_SYRINGE | INTRAVENOUS | Status: AC
  Filled 2018-04-03: qty 5

## 2018-04-03 MED ORDER — LISINOPRIL 40 MG PO TABS
40.0000 mg | ORAL_TABLET | Freq: Two times a day (BID) | ORAL | Status: DC
  Administered 2018-04-03 – 2018-04-05 (×4): 40 mg via ORAL
  Filled 2018-04-03 (×4): qty 1

## 2018-04-03 MED ORDER — CLINDAMYCIN PHOSPHATE 600 MG/50ML IV SOLN
600.0000 mg | Freq: Four times a day (QID) | INTRAVENOUS | Status: AC
  Administered 2018-04-03 – 2018-04-04 (×3): 600 mg via INTRAVENOUS
  Filled 2018-04-03 (×3): qty 50

## 2018-04-03 MED ORDER — PANTOPRAZOLE SODIUM 40 MG PO TBEC
40.0000 mg | DELAYED_RELEASE_TABLET | Freq: Every day | ORAL | Status: DC
  Administered 2018-04-04 – 2018-04-05 (×2): 40 mg via ORAL
  Filled 2018-04-03 (×2): qty 1

## 2018-04-03 MED ORDER — ASPIRIN EC 81 MG PO TBEC
81.0000 mg | DELAYED_RELEASE_TABLET | Freq: Every day | ORAL | Status: DC
  Administered 2018-04-04 – 2018-04-05 (×2): 81 mg via ORAL
  Filled 2018-04-03 (×2): qty 1

## 2018-04-03 MED ORDER — ONDANSETRON 4 MG PO TBDP: 4.0000 mg | ORAL_TABLET | Freq: Three times a day (TID) | ORAL | 0 refills | Status: DC | PRN

## 2018-04-03 MED ORDER — FENTANYL CITRATE (PF) 250 MCG/5ML IJ SOLN
INTRAMUSCULAR | Status: AC
  Filled 2018-04-03: qty 5

## 2018-04-03 MED ORDER — SODIUM CHLORIDE 0.9 % IR SOLN
Status: DC | PRN
  Administered 2018-04-03: 6000 mL

## 2018-04-03 MED ORDER — CHLORHEXIDINE GLUCONATE 4 % EX LIQD: 60.0000 mL | Freq: Once | CUTANEOUS | Status: DC

## 2018-04-03 MED ORDER — LIDOCAINE-EPINEPHRINE 1 %-1:100000 IJ SOLN
INTRAMUSCULAR | Status: AC
  Filled 2018-04-03: qty 1

## 2018-04-03 MED ORDER — MIDAZOLAM HCL 2 MG/2ML IJ SOLN
INTRAMUSCULAR | Status: AC
  Filled 2018-04-03: qty 2

## 2018-04-03 MED ORDER — STERILE WATER FOR IRRIGATION IR SOLN
Status: DC | PRN
  Administered 2018-04-03: 1000 mL

## 2018-04-03 MED ORDER — GABAPENTIN 300 MG PO CAPS
600.0000 mg | ORAL_CAPSULE | Freq: Three times a day (TID) | ORAL | Status: DC
  Administered 2018-04-03 – 2018-04-05 (×5): 600 mg via ORAL
  Filled 2018-04-03 (×5): qty 2

## 2018-04-03 MED ORDER — MORPHINE SULFATE (PF) 2 MG/ML IV SOLN
2.0000 mg | INTRAVENOUS | Status: DC | PRN
  Administered 2018-04-04 (×5): 2 mg via INTRAVENOUS
  Filled 2018-04-03 (×5): qty 1

## 2018-04-03 MED ORDER — CYCLOBENZAPRINE HCL 10 MG PO TABS: 10.0000 mg | ORAL_TABLET | Freq: Three times a day (TID) | ORAL | Status: DC | PRN

## 2018-04-03 MED ORDER — PROMETHAZINE HCL 25 MG/ML IJ SOLN: 6.2500 mg | INTRAMUSCULAR | Status: DC | PRN

## 2018-04-03 MED ORDER — OXYCODONE HCL 5 MG PO TABS: 5.0000 mg | ORAL_TABLET | Freq: Once | ORAL | Status: DC | PRN

## 2018-04-03 MED ORDER — LACTATED RINGERS IV SOLN
INTRAVENOUS | Status: DC
  Administered 2018-04-03: 10:00:00 via INTRAVENOUS

## 2018-04-03 MED ORDER — ALPRAZOLAM 0.5 MG PO TABS
0.5000 mg | ORAL_TABLET | Freq: Four times a day (QID) | ORAL | Status: DC | PRN
  Administered 2018-04-04: 0.5 mg via ORAL
  Filled 2018-04-03: qty 1

## 2018-04-03 MED ORDER — CLINDAMYCIN PHOSPHATE 900 MG/50ML IV SOLN
900.0000 mg | INTRAVENOUS | Status: AC
  Administered 2018-04-03: 900 mg via INTRAVENOUS
  Filled 2018-04-03: qty 50

## 2018-04-03 MED ORDER — DEXAMETHASONE SODIUM PHOSPHATE 10 MG/ML IJ SOLN
INTRAMUSCULAR | Status: DC | PRN
  Administered 2018-04-03: 5 mg via INTRAVENOUS

## 2018-04-03 MED ORDER — METFORMIN HCL 500 MG PO TABS
1000.0000 mg | ORAL_TABLET | Freq: Two times a day (BID) | ORAL | Status: DC
  Administered 2018-04-04 (×2): 1000 mg via ORAL
  Filled 2018-04-03 (×3): qty 2

## 2018-04-03 MED ORDER — BUPIVACAINE-EPINEPHRINE (PF) 0.25% -1:200000 IJ SOLN
INTRAMUSCULAR | Status: AC
  Filled 2018-04-03: qty 30

## 2018-04-03 MED ORDER — DEXMEDETOMIDINE HCL IN NACL 200 MCG/50ML IV SOLN
INTRAVENOUS | Status: DC | PRN
  Administered 2018-04-03: 12 ug via INTRAVENOUS
  Administered 2018-04-03: 700 ug via INTRAVENOUS
  Administered 2018-04-03: 20 ug via INTRAVENOUS

## 2018-04-03 MED ORDER — POVIDONE-IODINE 10 % EX SWAB: 2.0000 "application " | Freq: Once | CUTANEOUS | Status: DC

## 2018-04-03 MED ORDER — DEXMEDETOMIDINE HCL IN NACL 200 MCG/50ML IV SOLN
INTRAVENOUS | Status: AC
  Filled 2018-04-03: qty 50

## 2018-04-03 MED ORDER — IBUPROFEN 200 MG PO TABS
600.0000 mg | ORAL_TABLET | Freq: Three times a day (TID) | ORAL | Status: DC | PRN
  Administered 2018-04-05: 600 mg via ORAL
  Filled 2018-04-03: qty 3

## 2018-04-03 MED ORDER — VITAMIN D (ERGOCALCIFEROL) 1.25 MG (50000 UNIT) PO CAPS
50000.0000 [IU] | ORAL_CAPSULE | ORAL | Status: DC
  Filled 2018-04-03: qty 1

## 2018-04-03 MED ORDER — FENTANYL CITRATE (PF) 100 MCG/2ML IJ SOLN
INTRAMUSCULAR | Status: DC | PRN
  Administered 2018-04-03: 100 ug via INTRAVENOUS
  Administered 2018-04-03: 50 ug via INTRAVENOUS

## 2018-04-03 MED ORDER — PROPOFOL 10 MG/ML IV BOLUS
INTRAVENOUS | Status: AC
  Filled 2018-04-03: qty 20

## 2018-04-03 MED ORDER — OXYCODONE HCL 5 MG/5ML PO SOLN: 5.0000 mg | Freq: Once | ORAL | Status: DC | PRN

## 2018-04-03 MED ORDER — LORATADINE 10 MG PO TABS
10.0000 mg | ORAL_TABLET | Freq: Every day | ORAL | Status: DC
  Administered 2018-04-04 – 2018-04-05 (×2): 10 mg via ORAL
  Filled 2018-04-03 (×2): qty 1

## 2018-04-03 MED ORDER — METOPROLOL SUCCINATE ER 100 MG PO TB24
100.0000 mg | ORAL_TABLET | Freq: Two times a day (BID) | ORAL | Status: DC
  Administered 2018-04-03 – 2018-04-05 (×4): 100 mg via ORAL
  Filled 2018-04-03 (×4): qty 1

## 2018-04-03 MED ORDER — ACETAMINOPHEN 650 MG RE SUPP: 650.0000 mg | Freq: Four times a day (QID) | RECTAL | Status: DC | PRN

## 2018-04-03 MED ORDER — CANAGLIFLOZIN-METFORMIN HCL 50-1000 MG PO TABS: 1.0000 | ORAL_TABLET | Freq: Two times a day (BID) | ORAL | Status: DC

## 2018-04-03 MED ORDER — LEVOTHYROXINE SODIUM 112 MCG PO TABS
274.0000 ug | ORAL_TABLET | Freq: Every day | ORAL | Status: DC
  Administered 2018-04-04 – 2018-04-05 (×2): 274 ug via ORAL
  Filled 2018-04-03 (×2): qty 2

## 2018-04-03 MED ORDER — DICYCLOMINE HCL 10 MG PO CAPS
10.0000 mg | ORAL_CAPSULE | Freq: Two times a day (BID) | ORAL | Status: DC
  Administered 2018-04-03 – 2018-04-05 (×4): 10 mg via ORAL
  Filled 2018-04-03 (×5): qty 1

## 2018-04-03 MED ORDER — INSULIN ASPART 100 UNIT/ML ~~LOC~~ SOLN: 0.0000 [IU] | Freq: Every day | SUBCUTANEOUS | Status: DC

## 2018-04-03 MED ORDER — FENTANYL CITRATE (PF) 100 MCG/2ML IJ SOLN: 25.0000 ug | INTRAMUSCULAR | Status: DC | PRN

## 2018-04-03 MED ORDER — OXYCODONE HCL 5 MG PO TABS
5.0000 mg | ORAL_TABLET | ORAL | Status: DC | PRN
  Administered 2018-04-03 – 2018-04-05 (×9): 10 mg via ORAL
  Filled 2018-04-03 (×9): qty 2

## 2018-04-03 MED ORDER — SCOPOLAMINE 1 MG/3DAYS TD PT72
MEDICATED_PATCH | TRANSDERMAL | Status: AC
  Filled 2018-04-03: qty 1

## 2018-04-03 MED ORDER — ACETAMINOPHEN 325 MG PO TABS: 650.0000 mg | ORAL_TABLET | Freq: Four times a day (QID) | ORAL | Status: DC | PRN

## 2018-04-03 MED ORDER — ATORVASTATIN CALCIUM 20 MG PO TABS
20.0000 mg | ORAL_TABLET | Freq: Every day | ORAL | Status: DC
  Administered 2018-04-04: 20 mg via ORAL
  Filled 2018-04-03: qty 1

## 2018-04-03 MED ORDER — INSULIN ASPART 100 UNIT/ML ~~LOC~~ SOLN: 0.0000 [IU] | Freq: Three times a day (TID) | SUBCUTANEOUS | Status: DC

## 2018-04-03 MED ORDER — ENOXAPARIN SODIUM 30 MG/0.3ML ~~LOC~~ SOLN
30.0000 mg | SUBCUTANEOUS | Status: DC
  Administered 2018-04-04: 30 mg via SUBCUTANEOUS
  Filled 2018-04-03: qty 0.3

## 2018-04-03 MED ORDER — PROPOFOL 10 MG/ML IV BOLUS
INTRAVENOUS | Status: DC | PRN
  Administered 2018-04-03: 200 mg via INTRAVENOUS

## 2018-04-03 MED ORDER — SCOPOLAMINE 1 MG/3DAYS TD PT72
1.0000 | MEDICATED_PATCH | TRANSDERMAL | Status: DC
  Administered 2018-04-03: 1.5 mg via TRANSDERMAL

## 2018-04-03 MED ORDER — BUPIVACAINE-EPINEPHRINE 0.25% -1:200000 IJ SOLN
INTRAMUSCULAR | Status: DC | PRN
  Administered 2018-04-03: 30 mL

## 2018-04-03 MED ORDER — SUCCINYLCHOLINE CHLORIDE 200 MG/10ML IV SOSY
PREFILLED_SYRINGE | INTRAVENOUS | Status: DC | PRN
  Administered 2018-04-03: 140 mg via INTRAVENOUS

## 2018-04-03 MED ORDER — BIOTIN 10000 MCG PO TABS: 10000.0000 ug | ORAL_TABLET | Freq: Every day | ORAL | Status: DC

## 2018-04-03 MED ORDER — MIDAZOLAM HCL 5 MG/5ML IJ SOLN
INTRAMUSCULAR | Status: DC | PRN
  Administered 2018-04-03 (×2): 2 mg via INTRAVENOUS

## 2018-04-03 MED ORDER — LIDOCAINE 2% (20 MG/ML) 5 ML SYRINGE
INTRAMUSCULAR | Status: AC
Start: 2018-04-03 — End: ?
  Filled 2018-04-03: qty 5

## 2018-04-03 MED ORDER — FUROSEMIDE 40 MG PO TABS
40.0000 mg | ORAL_TABLET | Freq: Two times a day (BID) | ORAL | Status: DC
  Administered 2018-04-04 (×2): 40 mg via ORAL
  Filled 2018-04-03 (×3): qty 1

## 2018-04-03 MED ORDER — LIDOCAINE 2% (20 MG/ML) 5 ML SYRINGE
INTRAMUSCULAR | Status: DC | PRN
  Administered 2018-04-03: 100 mg via INTRAVENOUS

## 2018-04-03 MED ORDER — CLINDAMYCIN HCL 300 MG PO CAPS: 300.0000 mg | ORAL_CAPSULE | Freq: Three times a day (TID) | ORAL | 0 refills | Status: AC

## 2018-04-03 MED ORDER — HYDROCODONE-ACETAMINOPHEN 5-325 MG PO TABS: 1.0000 | ORAL_TABLET | Freq: Four times a day (QID) | ORAL | 0 refills | Status: DC | PRN

## 2018-04-03 MED ORDER — HYDROCODONE-ACETAMINOPHEN 7.5-325 MG PO TABS: 1.0000 | ORAL_TABLET | Freq: Four times a day (QID) | ORAL | 0 refills | Status: AC | PRN

## 2018-04-03 SURGICAL SUPPLY — 31 items
BANDAGE ACE 6X5 VEL STRL LF (GAUZE/BANDAGES/DRESSINGS) IMPLANT
BANDAGE ELASTIC 6 VELCRO ST LF (GAUZE/BANDAGES/DRESSINGS) ×1 IMPLANT
BLADE CUTTER GATOR 3.5 (BLADE) ×2 IMPLANT
BLADE GREAT WHITE 4.2 (BLADE) ×1 IMPLANT
COVER SURGICAL LIGHT HANDLE (MISCELLANEOUS) ×2 IMPLANT
DRAPE EXTREMITY T 121X128X90 (DRAPE) ×1 IMPLANT
DRAPE IMP U-DRAPE 54X76 (DRAPES) ×1 IMPLANT
DRAPE STERI 35X30 U-POUCH (DRAPES) ×1 IMPLANT
DRAPE U-SHAPE 47X51 STRL (DRAPES) ×1 IMPLANT
DRSG PAD ABDOMINAL 8X10 ST (GAUZE/BANDAGES/DRESSINGS) ×1 IMPLANT
DURAPREP 26ML APPLICATOR (WOUND CARE) ×2 IMPLANT
GAUZE SPONGE 4X4 12PLY STRL (GAUZE/BANDAGES/DRESSINGS) ×1 IMPLANT
GAUZE SPONGE 4X4 16PLY XRAY LF (GAUZE/BANDAGES/DRESSINGS) ×2 IMPLANT
GAUZE XEROFORM 1X8 LF (GAUZE/BANDAGES/DRESSINGS) ×1 IMPLANT
GLOVE BIOGEL M 7.0 STRL (GLOVE) IMPLANT
GLOVE BIOGEL PI IND STRL 7.5 (GLOVE) ×1 IMPLANT
GLOVE BIOGEL PI IND STRL 8 (GLOVE) IMPLANT
GLOVE BIOGEL PI INDICATOR 7.5 (GLOVE) ×1
GLOVE BIOGEL PI INDICATOR 8 (GLOVE) ×1
GLOVE ORTHO TXT STRL SZ7.5 (GLOVE) ×1 IMPLANT
GOWN STRL REUS W/TWL LRG LVL3 (GOWN DISPOSABLE) ×2 IMPLANT
GOWN STRL REUS W/TWL XL LVL3 (GOWN DISPOSABLE) IMPLANT
KIT BASIN OR (CUSTOM PROCEDURE TRAY) ×2 IMPLANT
LEGGING LITHOTOMY PAIR STRL (DRAPES) ×1 IMPLANT
MANIFOLD NEPTUNE II (INSTRUMENTS) ×2 IMPLANT
PACK ARTHROSCOPY DSU (CUSTOM PROCEDURE TRAY) ×2 IMPLANT
PADDING CAST COTTON 6X4 STRL (CAST SUPPLIES) ×2 IMPLANT
SUT ETHILON 4 0 PS 2 18 (SUTURE) ×2 IMPLANT
SYR 30ML LL (SYRINGE) ×2 IMPLANT
TOWEL OR 17X26 10 PK STRL BLUE (TOWEL DISPOSABLE) ×2 IMPLANT
TUBING ARTHROSCOPY IRRIG 16FT (MISCELLANEOUS) ×1 IMPLANT

## 2018-04-03 NOTE — H&P (Signed)
ORTHOPAEDIC H and P  REQUESTING PHYSICIAN: Nicholes Stairs, MD  PCP:  Reynold Bowen, MD  Chief Complaint: Left knee pain and swelling.  HPI: [name] is a 52 y.o. female who complains of A week now of increasing left knee pain, swelling, and warmth.  She was evaluated in our office last week where the knee was aspirated for concerns of gouty flareup.  Her cell count was 55,000.  Unfortunately she did end up growing staph aureus that is methicillin sensitive.  She presents today for arthroscopic debridement of her infected native left knee.  She has no new completes at this time.  Past Medical History:  Diagnosis Date  . Anginal pain ([place])    chest pain  heart cath done  . Anxiety   . Cancer [facility])    thyroid cancer  . Chronic bronchitis ([place])    "get it q yr"  . Chronic inflammatory demyelinating polyneuropathy ([place])   . Chronic kidney disease    Proteinuria  . Dyspnea    sob with exertion  . Early cataract    right  . Fibromyalgia   . GERD (gastroesophageal reflux disease)   . Heart murmur   . Hepatic adenoma   . High cholesterol   . History of gout   . History of hiatal hernia   . History of kidney stones   . Hypertension   . Hypothyroidism   . IBS (irritable bowel syndrome)   . Iron deficiency anemia    "comes and goes"  . Migraine    "@ least once/month" (02/18/2015)  . Obesity   . PONV (postoperative nausea and vomiting)    patch and anti nausea meds work well  . Proteinuria   . Spinal headache   . Syncope   . Tachycardia   . Type II diabetes mellitus ([place])   . Wears glasses    Past Surgical History:  Procedure Laterality Date  . ABDOMINAL HYSTERECTOMY  ~2012   "lap"  . ANKLE RECONSTRUCTION Left 03/29/2016   Procedure: LEFT ANKLE LATERAL LIGAMENT RECONSTRUCTION AND PERONEAL TENDON REPAIR OR TENOLYSIS;  Surgeon: Wylene Simmer, MD;  Location: [place];  Service: Orthopedics;  Laterality: Left;  . BACK SURGERY    . CARDIAC CATHETERIZATION   04/2009   "clean"  . COLONOSCOPY    . CYSTOSCOPY WITH URETEROSCOPY, STONE BASKETRY AND STENT PLACEMENT  ~ 2006  . ESOPHAGOGASTRODUODENOSCOPY    . insulin pump     U 500  . LAPAROSCOPIC CHOLECYSTECTOMY  05/2004  . LUMBAR DISC SURGERY  08/2011  . SURAL NERVE BX Left 09/30/2017   Procedure: LEFT SURAL NERVE AND MUSCLE BIOPSY;  Surgeon: Newman Pies, MD;  Location: [facility];  Service: Neurosurgery;  Laterality: Left;  LEFT SURAL NERVE AND MUSCLE BIOPSY  . THYROIDECTOMY N/A 02/18/2015   Procedure: TOTAL THYROIDECTOMY;  Surgeon: [name], MD;  Location: [facility];  Service: General;  Laterality: N/A;  . TOTAL THYROIDECTOMY  02/18/2015   Social History   Socioeconomic History  . Marital status: Married    Spouse name: Not on file  . Number of children: Not on file  . Years of education: Not on file  . Highest education level: Not on file  Occupational History  . Occupation: Programmer, multimedia: [employer]  . Financial resource strain: Not on file  . Food insecurity:    Worry: Not on file    Inability: Not on file  . Transportation needs:  Medical: Not on file    Non-medical: Not on file  Tobacco Use  . Smoking status: Never Smoker  . Smokeless tobacco: Never Used  Substance and Sexual Activity  . Alcohol use: No  . Drug use: No  . Sexual activity: Yes  Lifestyle  . Physical activity:    Days per week: Not on file    Minutes per session: Not on file  . Stress: Not on file  Relationships  . Social connections:    Talks on phone: Not on file    Gets together: Not on file    Attends religious service: Not on file    Active member of club or organization: Not on file    Attends meetings of clubs or organizations: Not on file    Relationship status: Not on file  Other Topics Concern  . Not on file  Social History Narrative   Lives with husband in a one story home.  No children.  RN.  Education: college.    Family History  Problem Relation Age of Onset  .  Hypertension Mother   . Emphysema Mother   . AAA (abdominal aortic aneurysm) Mother   . Hypertension Father   . Coronary artery disease Father   . Cancer - Prostate Father   . Renal Disease Father   . Hypertension Brother    Allergies  Allergen Reactions  . Penicillins Anaphylaxis, Shortness Of Breath, Swelling and Palpitations    Has patient had a PCN reaction causing immediate rash, facial/tongue/throat swelling, SOB or lightheadedness with hypotension: Yes Has patient had a PCN reaction causing severe rash involving mucus membranes or skin necrosis: No Has patient had a PCN reaction that required hospitalization No Has patient had a PCN reaction occurring within the last 10 years: No If all of the above answers are "NO", then may proceed with Cephalosporin use.   . Clarithromycin Nausea And Vomiting    Any antibiotic (doxyclycine, etc)  . Adhesive [Tape] Itching    Redness  . Latex Itching and Rash    Blisters with prolonged contact  . Milk-Related Compounds Other (See Comments)    Patient preference, does not like  . Moxifloxacin Nausea And Vomiting    Needs Zofran or Phenergan   Prior to Admission medications   Medication Sig Start Date End Date Taking? Authorizing Provider  acetaminophen (TYLENOL) 500 MG tablet Take 1,000 mg by mouth every 6 (six) hours as needed.   Yes [provider]  ALPRAZolam Duanne Moron) 0.5 MG tablet Take 0.5 mg by mouth every 6 (six) hours as needed for anxiety or sleep.    Yes [provider]  ammonium lactate (LAC-HYDRIN) 12 % lotion Apply 1 application topically 3 (three) times daily as needed for dry skin.    Yes [provider]  aspirin EC 81 MG tablet Take 81 mg by mouth daily.   Yes [provider]  atorvastatin (LIPITOR) 20 MG tablet Take 20 mg by mouth daily at 6 PM.    Yes [provider]  Biotin 10000 MCG TABS Take 10,000 mcg by mouth at bedtime.   Yes [provider]  cetirizine (ZYRTEC)  10 MG tablet Take 10 mg by mouth daily as needed for allergies.   Yes [provider]  clotrimazole-betamethasone (LOTRISONE) lotion Apply 1 application topically 2 (two) times daily as needed. For skin folds 08/30/17  Yes [provider]  cyanocobalamin (,VITAMIN B-12,) 1000 MCG/ML injection Inject 1,000 mcg into the muscle every 30 (thirty) days.  Yes [provider]  cyclobenzaprine (FLEXERIL) 10 MG tablet Take 10 mg by mouth 3 (three) times daily as needed for muscle spasms (scheduled each night at bedtime).    Yes [provider]  D3-50 50000 units capsule Take 50,000 Units by mouth every Saturday. 08/30/17  Yes [provider]  Dermatological Products, [place]. (HYLATOPIC PLUS) CREA Apply 1 application topically 4 (four) times daily as needed (for hands irriation.).    Yes [provider]  dexlansoprazole (DEXILANT) 60 MG capsule Take 60 mg by mouth daily at 6 (six) AM.   Yes [provider]  dicyclomine (BENTYL) 10 MG capsule Take 10 mg by mouth 2 (two) times daily.    Yes [provider]  doxycycline (PERIOSTAT) 20 MG tablet Take 20 mg by mouth 2 (two) times daily as needed. For rosacea. 09/05/17  Yes [provider]  fluticasone (FLONASE) 50 MCG/ACT nasal spray Place 1 spray into the nose at bedtime. Reported on 12/27/2015   Yes [provider]  furosemide (LASIX) 40 MG tablet Take 40 mg by mouth 2 (two) times daily.     Yes [provider]  gabapentin (NEURONTIN) 300 MG capsule Take 2 capsules (600 mg total) by mouth 3 (three) times daily. Patient taking differently: Take 600 mg by mouth 3 (three) times daily. (MAY HOLD AND TAKE 100 MG CAPSULES BASED ON SEDATION LEVEL) 04/26/17  Yes Patel, Donika K, DO  halobetasol (ULTRAVATE) 0.05 % cream Apply 1 application topically 2 (two) times daily as needed (for finger blistering).    Yes [provider]  HYDROcodone-acetaminophen (NORCO) 7.5-325 MG  tablet Take 1 tablet by mouth every 6 (six) hours as needed (for pain.).  05/08/16  Yes [provider]  hyoscyamine (LEVSIN SL) 0.125 MG SL tablet Take 1-2 tablets sublingually every 6 hours as needed. Patient taking differently: Take 0.125 mg by mouth every 4 (four) hours as needed for cramping. Take 1-2 tablets sublingually every 4 hours as needed. 03/11/18  Yes Jackquline Denmark, MD  ibuprofen (ADVIL,MOTRIN) 200 MG tablet Take 600-800 mg by mouth every 8 (eight) hours as needed (for pain.).    Yes [provider]  Insulin Human (INSULIN PUMP) SOLN Inject into the skin.   Yes [provider]  insulin regular human CONCENTRATED (HUMULIN R) 500 UNIT/ML injection Inject 2 Units into the skin See admin instructions. 2.0 units /hour 0500-2359, 1.95units 8101-7510 with carb ratio 1:50 for meal time bolus   Yes [provider]  INVOKAMET 50-1000 MG TABS Take 1 tablet by mouth 2 (two) times daily.  10/20/14  Yes [provider]  ketoconazole (NIZORAL) 2 % cream Apply 1 application topically 2 (two) times daily as needed. For tops of toes. 09/18/17  Yes [provider]  levothyroxine (SYNTHROID, LEVOTHROID) 137 MCG tablet Take 274 mcg by mouth daily before breakfast.   Yes [provider]  lisinopril (PRINIVIL,ZESTRIL) 20 MG tablet Take 40 mg by mouth 2 (two) times daily.   Yes [provider]  metoprolol succinate (TOPROL-XL) 100 MG 24 hr tablet Take 100 mg by mouth 2 (two) times daily. Take with or immediately following a meal.   Yes [provider]  metroNIDAZOLE (METROCREAM) 0.75 % cream Apply 1 application topically 2 (two) times daily as needed (for rosacea.).    Yes [provider]  Multiple Vitamins-Minerals (ADULT ONE DAILY GUMMIES PO) Take 2 tablets by mouth at bedtime.   Yes [provider]  mupirocin ointment (BACTROBAN) 2 % Apply  1 application topically 2 (two) times daily as needed. To left toe ulcer.  09/18/17  Yes [provider]  ondansetron (ZOFRAN-ODT) 8 MG disintegrating tablet Take 8 mg by mouth every 6 (six) hours as needed for nausea or vomiting.    Yes [provider]  PROCTOSOL HC 2.5 % rectal cream Apply 1 application topically daily as needed for hemorrhoids.  12/28/15  Yes [provider]  triamcinolone ointment (KENALOG) 0.1 % Apply 1 application topically 2 (two) times daily as needed (for finger blister).    Yes [provider]  BAYER CONTOUR NEXT TEST test strip 1 each by Other route See admin instructions. Testing 3-6 times daily 08/06/14   [provider]   No results found.  Positive ROS: All other systems have been reviewed and were otherwise negative with the exception of those mentioned in the HPI and as above.  Physical Exam: General: Alert, no acute distress Cardiovascular: No pedal edema Respiratory: No cyanosis, no use of accessory musculature GI: No organomegaly, abdomen is soft and non-tender Skin: No lesions in the area of chief complaint Neurologic: Sensation intact distally Psychiatric: Patient is competent for consent with normal mood and affect Lymphatic: No axillary or cervical lymphadenopathy  MUSCULOSKELETAL:  Left knee is swollen with some mild erythema and warmth to the touch.  I will pain with passive and active range of motion.  Neurovascular intact otherwise.  Assessment: Septic left knee, native.  Plan: - Plan for arthroscopic debridement and irrigation of the left knee.  The risks, benefits, and indications of this procedure have been reviewed in our office yesterday when she followed up on her lab analysis. - We will plan to admit her postoperatively for 24 hour IV antibiotics and send her home on oral antibiotics as her final cultures did grow methicillin sensitive Staphylococcus aureus.    Nicholes Stairs, MD Cell [phone]    04/03/2018 10:37 AM

## 2018-04-03 NOTE — Anesthesia Preprocedure Evaluation (Addendum)
Anesthesia Evaluation  Patient identified by MRN, date of birth, ID band Patient awake    Reviewed: Allergy & Precautions, NPO status , Patient's Chart, lab work & pertinent test results, reviewed documented beta blocker date and time   History of Anesthesia Complications (+) PONV, POST - OP SPINAL HEADACHE and history of anesthetic complications  Airway Mallampati: II  TM Distance: <3 FB Neck ROM: Full    Dental no notable dental hx. (+) Dental Advisory Given, Teeth Intact   Pulmonary COPD,    breath sounds clear to auscultation (-) decreased breath sounds      Cardiovascular hypertension, Pt. on medications and Pt. on home beta blockers Normal cardiovascular exam Rhythm:Regular Rate:Normal  '18 TTE - Mild LVH. EF 65% to 70%. Grade 1 diastolic  dysfunction.   Neuro/Psych  Headaches, PSYCHIATRIC DISORDERS Anxiety Depression negative psych ROS   GI/Hepatic Neg liver ROS, hiatal hernia, GERD  Medicated and Poorly Controlled,  Endo/Other  diabetes, Insulin DependentHypothyroidism Morbid obesityInsulin pump - normally runs with BS in 70's  Renal/GU negative Renal ROS  negative genitourinary   Musculoskeletal  (+) Fibromyalgia -  Abdominal (+) + obese,   Peds negative pediatric ROS (+)  Hematology negative hematology ROS (+)   Anesthesia Other Findings   Reproductive/Obstetrics negative OB ROS                          Anesthesia Physical  Anesthesia Plan  ASA: III  Anesthesia Plan: General   Post-op Pain Management:    Induction: Intravenous  PONV Risk Score and Plan: 4 or greater and Treatment may vary due to age or medical condition, Ondansetron, Scopolamine patch - Pre-op and Midazolam  Airway Management Planned: Oral ETT  Additional Equipment: None  Intra-op Plan:   Post-operative Plan: Extubation in OR  Informed Consent: I have reviewed the patients History and Physical,  chart, labs and discussed the procedure including the risks, benefits and alternatives for the proposed anesthesia with the patient or authorized representative who has indicated his/her understanding and acceptance.   Dental advisory given  Plan Discussed with: CRNA and Anesthesiologist  Anesthesia Plan Comments: (Insulin pump reduced from 3 units to 1 unit for duration of procedure.)      Anesthesia Quick Evaluation

## 2018-04-03 NOTE — Anesthesia Postprocedure Evaluation (Signed)
Anesthesia Post Note  Patient: [name]  Procedure(s) Performed: Arthroscopic versus open irrigation and debridement  left knee (Left Knee)     Patient location during evaluation: PACU Anesthesia Type: General Level of consciousness: awake and alert, oriented and patient cooperative Pain management: pain level controlled Vital Signs Assessment: post-procedure vital signs reviewed and stable Respiratory status: spontaneous breathing, nonlabored ventilation and respiratory function stable Cardiovascular status: blood pressure returned to baseline and stable Postop Assessment: no apparent nausea or vomiting Anesthetic complications: no    Last Vitals:  Vitals:   04/03/18 1411 04/03/18 1500  BP: 130/86   Pulse: (!) 109 (!) 108  Resp: 19 19  Temp:    SpO2: 94% 95%    Last Pain:  Vitals:   04/03/18 1500  TempSrc:   PainSc: 0-No pain                 Maleke Feria,E. Phyliss Hulick

## 2018-04-03 NOTE — Progress Notes (Signed)
CBG 70 with basal insulin pump rate at 1.5. Pt is awake,alert,no c/o. Dr Jenita Seashore updated-order to leave basal rate at 1.5 and give pt po fluid as tol.

## 2018-04-03 NOTE — Brief Op Note (Signed)
04/03/2018  12:41 PM  PATIENT:  Robie M Roberts-Angulo  51 y.o. female  PRE-OPERATIVE DIAGNOSIS:  Infected left native knee  POST-OPERATIVE DIAGNOSIS:  Infected left native knee  PROCEDURE:  Procedure(s): Arthroscopic versus open irrigation and debridement  left knee (Left)  SURGEON:  Surgeon(s) and Role:    * Nicholes Stairs, MD - Primary  PHYSICIAN ASSISTANT:   ASSISTANTS: none   ANESTHESIA:   general  EBL:  20 cc  BLOOD ADMINISTERED:none  DRAINS: none   LOCAL MEDICATIONS USED:  MARCAINE     SPECIMEN:  No Specimen  DISPOSITION OF SPECIMEN:  N/A  COUNTS:  YES  TOURNIQUET:  * No tourniquets in log *  DICTATION: .Note written in [place]: admit for observation  PATIENT DISPOSITION:  PACU - hemodynamically stable.   Delay start of Pharmacological VTE agent (>24hrs) due to surgical blood loss or risk of bleeding: not applicable

## 2018-04-03 NOTE — Progress Notes (Signed)
Diabetes Coordinator Lauren RN updated re pt's insulin pump, settings, CBG & that pt to be admitted. No changes made .

## 2018-04-03 NOTE — Progress Notes (Signed)
Dr Fransisco Beau called and informed of insulin pump settings and CBG of 78. States to leave  The pump as is.

## 2018-04-03 NOTE — Anesthesia Procedure Notes (Signed)
Procedure Name: Intubation Date/Time: 04/03/2018 11:52 AM Performed by: Moshe Salisbury, CRNA Pre-anesthesia Checklist: Patient identified, Emergency Drugs available, Suction available and Patient being monitored Patient Re-evaluated:Patient Re-evaluated prior to induction Oxygen Delivery Method: Circle System Utilized Preoxygenation: Pre-oxygenation with 100% oxygen Induction Type: IV induction Ventilation: Mask ventilation without difficulty Laryngoscope Size: Mac and 2 Grade View: Grade II Tube type: Oral Tube size: 7.5 mm Number of attempts: 1 Airway Equipment and Method: Stylet Placement Confirmation: ETT inserted through vocal cords under direct vision,  positive ETCO2 and breath sounds checked- equal and bilateral Secured at: 22 cm Tube secured with: Tape Dental Injury: Teeth and Oropharynx as per pre-operative assessment

## 2018-04-03 NOTE — Discharge Instructions (Signed)
Discharge instruction:  -Okay for full weightbearing as tolerated to left lower extremity.  Use crutches as needed. -Elevate and ice the left knee throughout the day as much as possible. -Resume your preoperative aspirin for prevention of blood clots. -For mild to moderate pain take ibuprofen and/or Tylenol as needed.  For breakthrough pain take Norco as directed. -Maintain your postoperative bandages for 3 days.  Only fourth day you may remove these bandages and begin showering.  Cover your incision sites with Band-Aids.  Do not submerge underwater until you have been seen back in the office. -Follow-up with Dr. Stann Mainland in the office in 2 weeks for wound check. -He will take clindamycin 3 times daily for 4 weeks for completion of treatment of this infected knee.

## 2018-04-03 NOTE — Transfer of Care (Signed)
Immediate Anesthesia Transfer of Care Note  Patient: [name]  Procedure(s) Performed: Arthroscopic versus open irrigation and debridement  left knee (Left Knee)  Patient Location: PACU  Anesthesia Type:General  Level of Consciousness: drowsy and patient cooperative  Airway & Oxygen Therapy: Patient Spontanous Breathing and Patient connected to face mask oxygen  Post-op Assessment: Report given to RN, Post -op Vital signs reviewed and stable and Patient moving all extremities  Post vital signs: Reviewed and stable  Last Vitals:  Vitals Value Taken Time  BP 123/69 04/03/2018 12:56 PM  Temp    Pulse 120 04/03/2018 12:58 PM  Resp 27 04/03/2018 12:58 PM  SpO2 91 % 04/03/2018 12:58 PM  Vitals shown include unvalidated device data.  Last Pain:  Vitals:   04/03/18 0951  TempSrc: Oral  PainSc:       Patients Stated Pain Goal: 4 (97/74/14 2395)  Complications: No apparent anesthesia complications

## 2018-04-03 NOTE — Op Note (Signed)
Surgery Date: 04/03/2018  Surgeon(s): Nicholes Stairs, MD  ANESTHESIA:  general  FLUIDS: Per anesthesia record.   ESTIMATED BLOOD LOSS: minimal  PREOPERATIVE DIAGNOSES:  1.  Left knee septic arthritis 2.  knee synovitis  POSTOPERATIVE DIAGNOSES:  same  PROCEDURES PERFORMED:  1.  Left knee arthroscopy with limited synovectomy 2.  left knee arthroscopic irrigation and debridement for infection  DESCRIPTION OF PROCEDURE: [name] is a 52 y.o.-year-old female with left knee septic arthritis.  She presented to my partner Dr. Alvan Dame with swollen painful left knee.  Aspiration indicated mildly elevated white blood cell count at 55,000.  That her cultures ended up growing MSSA.  She was indicated for irrigation and debridement.  Full discussion held regarding risks benefits alternatives and complications related surgical intervention. Conservative care options reviewed. All questions answered.  The patient was identified in the preoperative holding area and the operative extremity was marked. The patient was brought to the operating room and transferred to operating table in a supine position. Satisfactory general anesthesia was induced by anesthesiology.    Standard anterolateral, anteromedial arthroscopy portals were obtained. The anteromedial portal was obtained with a spinal needle for localization under direct visualization with subsequent diagnostic findings.   Immediately upon establishing the anterior lateral portal approximately 20 cc of purulent fluid evacuated the knee.  Anteromedial and anterolateral chambers: severe synovitis. The synovitis was debrided with a 4.5 mm full radius shaver through both the anteromedial and lateral portals.   Suprapatellar pouch and gutters: moderate synovitis or debris. Patella chondral surface: Grade 2 Trochlear chondral surface: Grade 2 Patellofemoral tracking: Midline Medial meniscus: Free edge tearing.  Medial femoral condyle  flexion bearing surface: Grade 2 Medial femoral condyle extension bearing surface: Grade 2 Medial tibial plateau: Grade 3 Anterior cruciate ligament:stable Posterior cruciate ligament:stable Lateral meniscus: Intact with some free edge tearing.   Lateral femoral condyle flexion bearing surface: Grade 2 Lateral femoral condyle extension bearing surface: Grade 3 Lateral tibial plateau: Grade 2  After completion of synovectomy, diagnostic exam, and debridements as described, all compartments were checked and no residual debris remained.  The knee was thoroughly lavaged with 3 L of normal saline.  Hemostasis was achieved with the cautery wand. The portals were approximated with interrupted nylon. All excess fluid was expressed from the joint.  Xeroform sterile gauze dressings were applied followed by Ace bandage and ice pack.   DISPOSITION: The patient was awakened from general anesthetic, extubated, taken to the recovery room in medically stable condition, no apparent complications. The patient may be weightbearing as tolerated to the operative lower extremity.  Range of motion of right knee as tolerated.

## 2018-04-04 ENCOUNTER — Encounter: Payer: Self-pay | Admitting: *Deleted

## 2018-04-04 ENCOUNTER — Encounter (HOSPITAL_COMMUNITY): Payer: Self-pay | Admitting: Orthopedic Surgery

## 2018-04-04 DIAGNOSIS — I10 Essential (primary) hypertension: Secondary | ICD-10-CM | POA: Diagnosis not present

## 2018-04-04 DIAGNOSIS — E039 Hypothyroidism, unspecified: Secondary | ICD-10-CM | POA: Diagnosis not present

## 2018-04-04 DIAGNOSIS — E119 Type 2 diabetes mellitus without complications: Secondary | ICD-10-CM | POA: Diagnosis not present

## 2018-04-04 DIAGNOSIS — Z6841 Body Mass Index (BMI) 40.0 and over, adult: Secondary | ICD-10-CM | POA: Diagnosis not present

## 2018-04-04 DIAGNOSIS — F329 Major depressive disorder, single episode, unspecified: Secondary | ICD-10-CM | POA: Diagnosis not present

## 2018-04-04 DIAGNOSIS — M00862 Arthritis due to other bacteria, left knee: Secondary | ICD-10-CM | POA: Diagnosis not present

## 2018-04-04 DIAGNOSIS — E7800 Pure hypercholesterolemia, unspecified: Secondary | ICD-10-CM | POA: Diagnosis not present

## 2018-04-04 DIAGNOSIS — M797 Fibromyalgia: Secondary | ICD-10-CM | POA: Diagnosis not present

## 2018-04-04 DIAGNOSIS — K219 Gastro-esophageal reflux disease without esophagitis: Secondary | ICD-10-CM | POA: Diagnosis not present

## 2018-04-04 DIAGNOSIS — J449 Chronic obstructive pulmonary disease, unspecified: Secondary | ICD-10-CM | POA: Diagnosis not present

## 2018-04-04 DIAGNOSIS — F419 Anxiety disorder, unspecified: Secondary | ICD-10-CM | POA: Diagnosis not present

## 2018-04-04 DIAGNOSIS — M65862 Other synovitis and tenosynovitis, left lower leg: Secondary | ICD-10-CM | POA: Diagnosis not present

## 2018-04-04 DIAGNOSIS — G6181 Chronic inflammatory demyelinating polyneuritis: Secondary | ICD-10-CM | POA: Diagnosis not present

## 2018-04-04 DIAGNOSIS — K589 Irritable bowel syndrome without diarrhea: Secondary | ICD-10-CM | POA: Diagnosis not present

## 2018-04-04 DIAGNOSIS — B9561 Methicillin susceptible Staphylococcus aureus infection as the cause of diseases classified elsewhere: Secondary | ICD-10-CM | POA: Diagnosis not present

## 2018-04-04 LAB — GLUCOSE, CAPILLARY
Glucose-Capillary: 274 mg/dL — ABNORMAL HIGH (ref 65–99)
Glucose-Capillary: 205 mg/dL — ABNORMAL HIGH (ref 65–99)
Glucose-Capillary: 61 mg/dL — ABNORMAL LOW (ref 65–99)
Glucose-Capillary: 64 mg/dL — ABNORMAL LOW (ref 65–99)
Glucose-Capillary: 65 mg/dL (ref 65–99)

## 2018-04-04 LAB — HIV ANTIBODY (ROUTINE TESTING W REFLEX): HIV Screen 4th Generation wRfx: NONREACTIVE

## 2018-04-04 MED ORDER — INSULIN PUMP
Freq: Three times a day (TID) | SUBCUTANEOUS | Status: DC
  Administered 2018-04-04 – 2018-04-05 (×4): via SUBCUTANEOUS
  Filled 2018-04-04: qty 1

## 2018-04-04 MED ORDER — ENOXAPARIN SODIUM 80 MG/0.8ML ~~LOC~~ SOLN
65.0000 mg | SUBCUTANEOUS | Status: DC
  Administered 2018-04-05: 65 mg via SUBCUTANEOUS
  Filled 2018-04-04: qty 0.8

## 2018-04-04 NOTE — Evaluation (Signed)
Physical Therapy Evaluation Patient Details Name: [name] MRN: [id number] DOB: 03/24/1966 Today's Date: 04/04/2018   History of Present Illness  52 y.o. female admitted on 04/03/18 for R knee infection s/p I and D.  Post op WBAT with difficult pain management.  Pt with significant PMH of DM2, tachycardia, spinal HA, obesity, HTN, gout, fibromyalgia, CKD, thyroid CA, chest pain, hypotension, CIPD, lumbar disc surgery, left ankle reconstruction.    Clinical Impression  Pt is able to move around the room with RW and min guard assist for safety.  Her gait is antalgic and she is at high risk of falls, but she was at high risk of falls PTA.  She is a retired Armed forces operational officer and she knows her risks mobilizing and she and her husband have worked well as a team to keep her as safe and mobile as possible without confining her to a WC.  She would benefit from HHPT and her own equipment.   PT to follow acutely for deficits listed below.       Follow Up Recommendations Home health PT;Supervision for mobility/OOB    Equipment Recommendations  Rolling walker with 5" wheels;3in1 (PT)    Recommendations for Other Services   NA     Precautions / Restrictions Precautions Precautions: Fall Precaution Comments: pt reports weekly falls Restrictions Weight Bearing Restrictions: Yes LLE Weight Bearing: Weight bearing as tolerated      Mobility  Bed Mobility Overal bed mobility: Needs Assistance Bed Mobility: Supine to Sit     Supine to sit: Min assist     General bed mobility comments: Min assist to help progress left leg to EOB.   Transfers Overall transfer level: Needs assistance Equipment used: Rolling walker (2 wheeled) Transfers: Sit to/from [facility] Sit to Stand: Min guard Stand pivot transfers: Min guard       General transfer comment: Min guard assist for safety durign transitions to stabilize trunk, RW and BSC during transitions.    Ambulation/Gait Ambulation/Gait assistance: Min guard Ambulation Distance (Feet): 5 Feet Assistive device: Rolling walker (2 wheeled) Gait Pattern/deviations: Step-to pattern;Antalgic     General Gait Details: Min guard assist for safety.  Antalgic gait pattern.       Balance Overall balance assessment: Needs assistance Sitting-balance support: Feet supported;No upper extremity supported Sitting balance-Leahy Scale: Good     Standing balance support: Bilateral upper extremity supported;No upper extremity supported;Single extremity supported Standing balance-Leahy Scale: Fair Standing balance comment: able to preform her own peri care with one hand on RW.  Min guard assist to stabilize the [facility]                             Pertinent Vitals/Pain Pain Assessment: Faces Faces Pain Scale: Hurts whole lot Pain Location: left knee Pain Descriptors / Indicators: Aching;Burning;Grimacing;Guarding Pain Intervention(s): Limited activity within patient's tolerance;Monitored during session;Repositioned;Premedicated before session    Home Living Family/patient expects to be discharged to:: Private residence Living Arrangements: Spouse/significant other Available Help at Discharge: Family;Available PRN/intermittently Type of Home: House Home Access: Ramped entrance;Other (comment)(kind of a impromptu homemade ramp)     Home Layout: One level Home Equipment: Walker - 2 wheels;Walker - 4 wheels;Cane - single point;Bedside commode;Tub bench Additional Comments: the walker and 3-in 1 were her father in laws and don't fit her    Prior Function Level of Independence: Needs assistance   Gait / Transfers Assistance Needed: walks with furniture and cane.  Uses rollator out and about.  Falls frequently           Extremity/TrunkAssessment   Upper Extremity Assessment Upper Extremity Assessment: Defer to OT evaluation    Lower Extremity Assessment Lower Extremity  Assessment: LLE deficits/detail LLE Deficits / Details: left leg limited by pain, ankle has very little ROM at baseline, knee 2-/5, hip 2/5 LLE Sensation: decreased light touch    Cervical / Trunk Assessment Cervical / Trunk Assessment: Other exceptions Cervical / Trunk Exceptions: h/o spinal surgery, left hip pain  Communication   Communication: No difficulties  Cognition Arousal/Alertness: Awake/alert Behavior During Therapy: Anxious Overall Cognitive Status: Within Functional Limits for tasks assessed                                           Exercises Total Joint Exercises Ankle Circles/Pumps: AROM;Right;10 reps Quad Sets: AROM;Left;5 reps Towel Squeeze: AROM;Left;5 reps Heel Slides: AAROM;Left;5 reps   Assessment/Plan    PT Assessment Patient needs continued PT services  PT Problem List Decreased strength;Decreased range of motion;Decreased activity tolerance;Decreased balance;Decreased mobility;Decreased knowledge of use of DME;Decreased knowledge of precautions;Pain;Obesity;Impaired sensation       PT Treatment Interventions DME instruction;Gait training;Functional mobility training;Therapeutic activities;Therapeutic exercise;Balance training;Patient/family education;Manual techniques;Modalities;Stair training    PT Goals (Current goals can be found in the Care Plan section)  Acute Rehab PT Goals Patient Stated Goal: to go home, be safe, get better PT Goal Formulation: With patient/family Time For Goal Achievement: 04/11/18 Potential to Achieve Goals: Good    Frequency Min 5X/week           AM-PAC PT "6 Clicks" Daily Activity  Outcome Measure Difficulty turning over in bed (including adjusting bedclothes, sheets and blankets)?: Unable Difficulty moving from lying on back to sitting on the side of the bed? : Unable Difficulty sitting down on and standing up from a chair with arms (e.g., wheelchair, bedside commode, etc,.)?: Unable Help needed  moving to and from a bed to chair (including a wheelchair)?: A Little Help needed walking in hospital room?: A Little Help needed climbing 3-5 steps with a railing? : A Lot 6 Click Score: 11    End of Session   Activity Tolerance: Patient limited by pain Patient left: in chair;with call bell/phone within reach;with family/visitor present Nurse Communication: Mobility status PT Visit Diagnosis: Repeated falls (R29.6);Muscle weakness (generalized) (M62.81);Difficulty in walking, not elsewhere classified (R26.2);Pain Pain - Right/Left: Left Pain - part of body: Knee    Time: 1027-2536 PT Time Calculation (min) (ACUTE ONLY): 177 min   Charges:          Wells Guiles B. Alwilda Gilland, PT, DPT [phone]   PT Evaluation $PT Eval Moderate Complexity: 1 Mod PT Treatments $Therapeutic Activity: 8-22 mins $Self Care/Home Management: 8-22   04/04/2018, 6:34 PM

## 2018-04-04 NOTE — Progress Notes (Signed)
Pt requested to speak with MD due to not being in agreement with disposition of dc to home. Pt verbalized that she has uncontrolled pain and would like for the prn IV medication to be used as often as ordered. Will continue to monitor pain level.

## 2018-04-04 NOTE — Telephone Encounter (Signed)
Sent patient a message via My Chart about an OT evaluation.

## 2018-04-04 NOTE — Progress Notes (Signed)
Pt with scanner device for CBG reading and noted 51 on monitor and rechecked with FSBG of 62. Pt had not eaten lunch or dinner. Pt instructed to eat dinner tray. Upon returning in 30 minutes, pt verbalized that food wasn't good and only ate orange and had 44 carbs, but pump gave 2.3 Units of insulin and a new meal tray was ordered. Pt instructed to drink juice cup at bedside and recheck with scanner. Pt states " I don't feel like I'm that low. I would really feel different if it was that low."  Will continue to monitor and pt drinking soda at bedside.

## 2018-04-04 NOTE — Progress Notes (Signed)
   Subjective:  Patient reports pain as moderate.  Having some pain control issues and still requiring iv for breakthrough.  Able to get out of bed to bathroom once.  deneis chills/NS.  Objective:   VITALS:   Vitals:   04/03/18 1615 04/03/18 1751 04/03/18 2203 04/04/18 0552  BP: (!) 145/93 (!) 148/87 129/66 114/74  Pulse: (!) 105 (!) 109 (!) 116 (!) 101  Resp: 20 20 17 17   Temp: 98.3 F (36.8 C) 98.5 F (36.9 C) 98.8 F (37.1 C) 99.5 F (37.5 C)  TempSrc:  Oral Oral Axillary  SpO2: 96% 94%  100%  Weight:      Height:        Neurovascular intact Sensation intact distally Intact pulses distally Dorsiflexion/Plantar flexion intact Incision: dressing C/D/I Compartment soft Distally the preop erythema is improved.  Lab Results  Component Value Date   WBC 9.7 04/03/2018   HGB 11.7 (L) 04/03/2018   HCT 36.3 04/03/2018   MCV 91.2 04/03/2018   PLT 435 (H) 04/03/2018   BMET    Component Value Date/Time   NA 135 04/03/2018 1013   K 4.1 04/03/2018 1013   CL 98 (L) 04/03/2018 1013   CO2 24 04/03/2018 1013   GLUCOSE 73 04/03/2018 1013   BUN 11 04/03/2018 1013   CREATININE 0.82 04/03/2018 1013   CALCIUM 9.0 04/03/2018 1013   GFRNONAA >60 04/03/2018 1013   GFRAA >60 04/03/2018 1013     Assessment/Plan: 1 Day Post-Op   Principal Problem:   Septic joint of left knee joint (HCC) Active Problems:   Septic arthritis of knee, left (HCC)    - ok for WBAT to LLE - will keep overnight for pain  Control and iv abx - SCDs and lovenox for DVT ppx - will take down dressing tomorrow and plan for dc home.  If no better at all may need second washout. - continue Clinda for MSSA septic left knee  - DM management per diabetes coordinator recs, to include home insulin pump and SSI   Nicholes Stairs 04/04/2018, 12:37 PM   Geralynn Rile, MD [phone]

## 2018-04-04 NOTE — Progress Notes (Addendum)
Pt refusing Insulin bolus and sliding scale due to her coverage of insulin pump of Humulin R-U500 and differs from hospital units of U100.  Pt verbalized covering herself with 7.7 Units this morning for CBG and carbs for breakfast. Will continue to monitor CBG's as ordered.

## 2018-04-04 NOTE — Progress Notes (Signed)
Patient has signed and been given copy of consent for CGM/Freestyle [place] research study. MD also notified.  Education done regarding application and changing CGM sensor (alternate every 14 days on back of arms), 1 hour warm-up, use of glucometer/where to buy strips, how to scan CGM for glucose reading and information for PCP. Patient has been given [employer] reader and first sensor for use. Patient has also been given educational packet regarding use CGM sensor including the 1-800 toll free number for any questions, problems or needs related to the [facility] sensors or reader.  Patient to be given Rx. For sensors with prescriptions/discharge paper work.  Sensor applied to left arm at 14:55.  Explained that glucose readings will not be available until 1 hour after application. Patient understands that Diabetes coordinator will call them 2 times after discharge (between days 7-12 after d/c from hospital and between days 30-25 after d/c from hospital). Patient verbalizes understanding of use of Freestyle Libre CGM and was told that any issues with blood sugars/diabetes will need to be addressed by PCP.  Diabetes Quality of Life Survey administered to patient.   At time of discharge please provide Rx for: [employer] (2) - order # [id number]  Thanks, Barnie Alderman, RN, MSN, CDE Diabetes Coordinator Inpatient Diabetes Program [phone] (Team Pager from 8am to 5pm)

## 2018-04-04 NOTE — Progress Notes (Signed)
Pt blood sugar was 64 after eating her meal tray, flowsheet showing she gave herself 2.1 from her insulin pump. Rechecked CBG and it was 65. Pt has given graham crackers and orange juice. Rechecked blood sugar and it is 68 as of now. Pt asymptomatic. Oncoming RN notified.

## 2018-04-04 NOTE — Progress Notes (Addendum)
Inpatient Diabetes Program Recommendations  AACE/ADA: New Consensus Statement on Inpatient Glycemic Control (2015)  Target Ranges:  Prepandial:   less than 140 mg/dL      Peak postprandial:   less than 180 mg/dL (1-2 hours)      Critically ill patients:  140 - 180 mg/dL   Lab Results  Component Value Date   GLUCAP 274 (H) 04/04/2018   HGBA1C 6.8 (H) 04/03/2018    Review of Glycemic Control Results for KAMORA, VOSSLER (MRN [id number]) as of 04/04/2018 10:29  Ref. Range 04/03/2018 13:04 04/03/2018 15:00 04/03/2018 22:05 04/04/2018 06:21  Glucose-Capillary Latest Ref Range: 65 - 99 mg/dL 70 102 (H) 228 (H) 274 (H)   Diabetes history: Type 2 DM Outpatient Diabetes medications: Insulin pump- U500 Current orders for Inpatient glycemic control: Novolog 0-15 units TID, Novolog 0-5 units QHS, Metformin 1000 mg BID  Inpatient Diabetes Program Recommendations:     Noted this AM RN note that patient refused correction scale and bolused using Medronic insulin pump using U-500. No insulin pump orders found. No contract.   Patient should not have the additional correction in if patient has insulin pump with U500.  Spoke with patient regarding diabetes and home regimen for diabetes management.  Patient states that she is followed by Dr Forde Dandy for diabetes management. Patient uses a Medtronic insulin pump with U-500 insulin as an outpatient. Patient has insulin pump in the bed and is connected and running. Patient states that she would prefer to continue using insulin pump. Has additional supplies here at hospital and can send husband to get more if needed.   Patient expresses concern for hospital's ability to manage diabetes as she has had to tell nursing staff that there is a contract that she needs to sign. Patient is very savvy in managing her pump and is able to count her necessary carbohydrates per meal. Expresses frustration with carb modified diet, because of how this limits her with her U500.  Patient is used to eating additional carbs and then covering it with the additional insulin. At this time, patient is requesting regular diet.  Patient is current running settings @ 1.5 units/hour. Anticipate that BS will be elevated if settings are continued at this rate. Would recommend increasing settings to home delivery.   Home insulin pump settings (per patient/calculated for U-500) are as follows:  Basal insulin  [id number] 2.6 units/hour 0600-1800 2.75 units/hour 1801-0000 2.6 units/hour Total daily basal insulin: 47.7 units/24 hours of U-500  Carb Coverage 1:15 1 unit for every 15 grams of carbohydrates  Insulin Sensitivity 1:60 1 unit drops blood glucose 60 mg/dl  Target Glucose Goals 0000-0000 90-120 mg/dl  Reviewed patient's current A1c of 6.8%, which is down from previous A1C. Explained what a A1c is and what it measures. Also reviewed goal A1c with patient, importance of good glucose control @ home, and blood sugar goals.  Patient verbalized understanding of information discussed and states that he does not have any further questions related to diabetes at this time.  Spoke with RN at the bedside for changes that needed to be made. Education provided to RN regarding U500, insulin pump order set, safety precautions that are needed with U500, how to document in [facility], and demonstrated how to place orders and print off contract. Encouraged her to call in the future if questions arise related to insulin pump/diabetes.  NURSING: Once insulin pump order set is ordered please print off the Patient insulin pump contract and flow sheet. The insulin pump  contract should be signed by the patient and then placed in the chart. The patient insulin pump flow sheet will be completed by the patient at the bedside and the RN caring for the patient will use the patient's flow sheet to document in the [facility]. RN will need to complete the Nursing Insulin Pump Flowsheet at least once a shift. Patient will  need to keep extra insulin pump supplies at the bedside at all times.   MD paged for orders.  Thanks, Bronson Curb, MSN, RNC-OB Diabetes Coordinator [phone] (8a-5p)   Addendum: @1126 : Observed patient adjusting insulin pump setting back to home settings set by Dr Forde Dandy. Discussed Free style libre as option for CGM. Patient considering it and is interested in more information. Will continue to follow for inpatient diabetes study.

## 2018-04-05 ENCOUNTER — Other Ambulatory Visit: Payer: Self-pay

## 2018-04-05 DIAGNOSIS — M65862 Other synovitis and tenosynovitis, left lower leg: Secondary | ICD-10-CM | POA: Diagnosis not present

## 2018-04-05 DIAGNOSIS — B9561 Methicillin susceptible Staphylococcus aureus infection as the cause of diseases classified elsewhere: Secondary | ICD-10-CM | POA: Diagnosis not present

## 2018-04-05 DIAGNOSIS — E039 Hypothyroidism, unspecified: Secondary | ICD-10-CM | POA: Diagnosis not present

## 2018-04-05 DIAGNOSIS — M797 Fibromyalgia: Secondary | ICD-10-CM | POA: Diagnosis not present

## 2018-04-05 DIAGNOSIS — E7800 Pure hypercholesterolemia, unspecified: Secondary | ICD-10-CM | POA: Diagnosis not present

## 2018-04-05 DIAGNOSIS — F329 Major depressive disorder, single episode, unspecified: Secondary | ICD-10-CM | POA: Diagnosis not present

## 2018-04-05 DIAGNOSIS — K219 Gastro-esophageal reflux disease without esophagitis: Secondary | ICD-10-CM | POA: Diagnosis not present

## 2018-04-05 DIAGNOSIS — M00862 Arthritis due to other bacteria, left knee: Secondary | ICD-10-CM | POA: Diagnosis not present

## 2018-04-05 DIAGNOSIS — K589 Irritable bowel syndrome without diarrhea: Secondary | ICD-10-CM | POA: Diagnosis not present

## 2018-04-05 DIAGNOSIS — Z6841 Body Mass Index (BMI) 40.0 and over, adult: Secondary | ICD-10-CM | POA: Diagnosis not present

## 2018-04-05 DIAGNOSIS — F419 Anxiety disorder, unspecified: Secondary | ICD-10-CM | POA: Diagnosis not present

## 2018-04-05 DIAGNOSIS — I10 Essential (primary) hypertension: Secondary | ICD-10-CM | POA: Diagnosis not present

## 2018-04-05 DIAGNOSIS — J449 Chronic obstructive pulmonary disease, unspecified: Secondary | ICD-10-CM | POA: Diagnosis not present

## 2018-04-05 DIAGNOSIS — E119 Type 2 diabetes mellitus without complications: Secondary | ICD-10-CM | POA: Diagnosis not present

## 2018-04-05 DIAGNOSIS — G6181 Chronic inflammatory demyelinating polyneuritis: Secondary | ICD-10-CM | POA: Diagnosis not present

## 2018-04-05 DIAGNOSIS — R269 Unspecified abnormalities of gait and mobility: Secondary | ICD-10-CM | POA: Diagnosis not present

## 2018-04-05 LAB — GLUCOSE, CAPILLARY
Glucose-Capillary: 68 mg/dL (ref 65–99)
Glucose-Capillary: 84 mg/dL (ref 65–99)
Glucose-Capillary: 69 mg/dL (ref 65–99)
Glucose-Capillary: 75 mg/dL (ref 65–99)
Glucose-Capillary: 84 mg/dL (ref 65–99)

## 2018-04-05 MED ORDER — GLUCOSE 40 % PO GEL
ORAL | Status: AC
  Administered 2018-04-05: 37.5 g
  Filled 2018-04-05: qty 1

## 2018-04-05 MED ORDER — FREESTYLE LIBRE 14 DAY SENSOR MISC: 1.0000 | Freq: Every day | 0 refills | Status: AC

## 2018-04-05 NOTE — Care Management Note (Signed)
Case Management Note  Patient Details  Name: [name] MRN: [id number] Date of Birth: 02/09/1966  Subjective/Objective:                 Spoke w patient at bedside to discuss DC needs. She states she spoke w MD/PA this morning and they discussed [place] PT OT. Patient would also like RW and 3/1. Orders placed. Referral accepted by [facility] for both [facility] and DME. DME to be delivered to room prior to DC today   Action/Plan:  DC to home w [place], DME to be delivered to room prior to DC.     Expected Discharge Date:  04/05/18               Expected Discharge Plan:  [place]  In-House Referral:     Discharge planning Services  CM Consult  Post Acute Care Choice:  Home Health, Durable Medical Equipment Choice offered to:  Patient  DME Arranged:  3-N-1, Walker rolling DME Agency:  [place]:  PT, OT [facility] Agency:  [place]  Status of Service:  Completed, signed off  If discussed at [place] of Stay Meetings, dates discussed:    Additional Comments:  Carles Collet, RN 04/05/2018, 8:40 AM

## 2018-04-05 NOTE — Progress Notes (Signed)
Notified answering service to page Md on call of patient's complaint of tightness of chest and desat to 84% with exertion. Patient is ST 110's. She has no other symptoms of distress. VS are stable. Performed an EKG that showed ST. Patient's blood sugars are low (mid-60's). All the above information was reported to on call physician. awaiting a response or orders.

## 2018-04-05 NOTE — Progress Notes (Signed)
Physical Therapy Treatment Patient Details Name: [name] MRN: [id number] DOB: Apr 24, 1966 Today's Date: 04/05/2018    History of Present Illness Pt is a 52 y.o. female admitted on 04/03/18 for R knee infection s/p I and D.  Post op WBAT with difficult pain management.  Pt with significant PMH of DM2, tachycardia, spinal HA, obesity, HTN, gout, fibromyalgia, CKD, thyroid CA, chest pain, hypotension, CIPD, lumbar disc surgery, left ankle reconstruction.      PT Comments    Pt making steady progress with functional mobility and able to tolerate further distance ambulation this session. Pt would continue to benefit from skilled physical therapy services at this time while admitted and after d/c to address the below listed limitations in order to improve overall safety and independence with functional mobility.    Follow Up Recommendations  Home health PT;Supervision for mobility/OOB     Equipment Recommendations  Rolling walker with 5" wheels;3in1 (PT)    Recommendations for Other Services       Precautions / Restrictions Precautions Precautions: Fall Restrictions Weight Bearing Restrictions: Yes LLE Weight Bearing: Weight bearing as tolerated    Mobility  Bed Mobility               General bed mobility comments: pt sitting OOB in recliner chair upon arrival  Transfers Overall transfer level: Needs assistance Equipment used: Rolling walker (2 wheeled) Transfers: Sit to/from Stand Sit to Stand: Min guard         General transfer comment: min guard for safety, good technique with RW  Ambulation/Gait Ambulation/Gait assistance: Min guard Ambulation Distance (Feet): 50 Feet Assistive device: Rolling walker (2 wheeled) Gait Pattern/deviations: Step-to pattern;Antalgic Gait velocity: decreased Gait velocity interpretation: <1.8 ft/sec, indicate of risk for recurrent falls General Gait Details: Min guard assist for safety.  Antalgic gait pattern.     Stairs             Wheelchair Mobility    Modified Rankin (Stroke Patients Only)       Balance Overall balance assessment: Needs assistance Sitting-balance support: Feet supported;No upper extremity supported Sitting balance-Leahy Scale: Good     Standing balance support: Bilateral upper extremity supported;No upper extremity supported;Single extremity supported Standing balance-Leahy Scale: Poor                              Cognition Arousal/Alertness: Awake/alert Behavior During Therapy: Anxious Overall Cognitive Status: Within Functional Limits for tasks assessed                                        Exercises      General Comments        Pertinent Vitals/Pain Pain Assessment: Faces Faces Pain Scale: Hurts even more Pain Location: left knee Pain Descriptors / Indicators: Aching;Burning;Grimacing;Guarding Pain Intervention(s): Monitored during session;Repositioned    Home Living                      Prior Function            PT Goals (current goals can now be found in the care plan section) Acute Rehab PT Goals PT Goal Formulation: With patient/family Time For Goal Achievement: 04/11/18 Potential to Achieve Goals: Good Progress towards PT goals: Progressing toward goals    Frequency    Min 5X/week      PT  Plan Current plan remains appropriate    Co-evaluation              AM-PAC PT "6 Clicks" Daily Activity  Outcome Measure  Difficulty turning over in bed (including adjusting bedclothes, sheets and blankets)?: Unable Difficulty moving from lying on back to sitting on the side of the bed? : Unable Difficulty sitting down on and standing up from a chair with arms (e.g., wheelchair, bedside commode, etc,.)?: Unable Help needed moving to and from a bed to chair (including a wheelchair)?: None Help needed walking in hospital room?: None Help needed climbing 3-5 steps with a railing? : A  Little 6 Click Score: 14    End of Session Equipment Utilized During Treatment: Gait belt Activity Tolerance: Patient limited by pain Patient left: in chair;with call bell/phone within reach;with family/visitor present Nurse Communication: Mobility status PT Visit Diagnosis: Other abnormalities of gait and mobility (R26.89);Pain Pain - Right/Left: Left Pain - part of body: Knee     Time: 0737-1062 PT Time Calculation (min) (ACUTE ONLY): 35 min  Charges:  $Gait Training: 8-22 mins $Therapeutic Activity: 8-22 mins                    G Codes:       [place], [place], [place] 04/05/2018, 12:37 PM

## 2018-04-05 NOTE — Progress Notes (Signed)
Pt is ready to go home. Advised pt to decrease Insulin pump setting due to low blood sugars last night until early morning. Diabetic coordinator notified. Pt will notify or make an appointment to see her DM MD.  Pt was ambulating with PT to the hallways this morning. Pain is controlled with oral narcotics. Discharge instructions given to pt. Discharged to home accompanied by spouse.

## 2018-04-05 NOTE — Progress Notes (Signed)
Patient's blood sugar has been running in the mid 60's requiring her to receive glucose gel. when rechecked the patient's blood sugar improved to 84

## 2018-04-05 NOTE — Discharge Summary (Signed)
Physician Discharge Summary  Patient ID: MAYBEL DAMBROSIO MRN: [id number] DOB/AGE: September 08, 1966 52 y.o.  Admit date: 04/03/2018 Discharge date:  04/05/18  Procedures:  Procedure(s) (LRB): Arthroscopic versus open irrigation and debridement  left knee (Left)  Attending Physician:  Dr. Victorino December  Admission Diagnoses:   Left knee septic arthritis  Discharge Diagnoses:  Principal Problem:   Septic joint of left knee joint ([place]) Active Problems:   Septic arthritis of knee, left [facility])  Past Medical History:  Diagnosis Date  . Anginal pain ([place])    chest pain  heart cath done  . Anxiety   . Cancer [facility])    thyroid cancer  . Chronic bronchitis ([place])    "get it q yr"  . Chronic inflammatory demyelinating polyneuropathy ([place])   . Chronic kidney disease    Proteinuria  . Dyspnea    sob with exertion  . Early cataract    right  . Fibromyalgia   . GERD (gastroesophageal reflux disease)   . Heart murmur   . Hepatic adenoma   . High cholesterol   . History of gout   . History of hiatal hernia   . History of kidney stones   . Hypertension   . Hypothyroidism   . IBS (irritable bowel syndrome)   . Iron deficiency anemia    "comes and goes"  . Migraine    "@ least once/month" (02/18/2015)  . Obesity   . PONV (postoperative nausea and vomiting)    patch and anti nausea meds work well  . Proteinuria   . Spinal headache   . Syncope   . Tachycardia   . Type II diabetes mellitus ([place])   . Wears glasses       PCP: Reynold Bowen, MD   Discharged Condition: good  Hospital Course:  Patient underwent the above stated procedure on 04/03/2018. Patient tolerated the procedure well and brought to the recovery room in good condition and subsequently to the floor.   Disposition: Discharge disposition: 01-Home or Self Care      with follow up in 2 weeks   Follow-up [place], Jason Patrick, MD In 2 weeks.   Specialty:  Orthopedic Surgery Why:  For  suture removal Contact information: [address] [place] [address] [phone]           Discharge Instructions    Call MD / Call 911   Complete by:  As directed    If you experience chest pain or shortness of breath, CALL 911 and be transported to the hospital emergency room.  If you develope a fever above 101 F, pus (white drainage) or increased drainage or redness at the wound, or calf pain, call your surgeon's office.   Call MD / Call 911   Complete by:  As directed    If you experience chest pain or shortness of breath, CALL 911 and be transported to the hospital emergency room.  If you develope a fever above 101 F, pus (white drainage) or increased drainage or redness at the wound, or calf pain, call your surgeon's office.   Constipation Prevention   Complete by:  As directed    Drink plenty of fluids.  Prune juice may be helpful.  You may use a stool softener, such as Colace (over the counter) 100 mg twice a day.  Use MiraLax (over the counter) for constipation as needed.   Constipation Prevention   Complete by:  As directed    Drink  plenty of fluids.  Prune juice may be helpful.  You may use a stool softener, such as Colace (over the counter) 100 mg twice a day.  Use MiraLax (over the counter) for constipation as needed.   Diet - low sodium heart healthy   Complete by:  As directed    Diet - low sodium heart healthy   Complete by:  As directed    Increase activity slowly as tolerated   Complete by:  As directed    Increase activity slowly as tolerated   Complete by:  As directed       Allergies as of 04/05/2018      Reactions   Penicillins Anaphylaxis, Shortness Of Breath, Swelling, Palpitations   Has patient had a PCN reaction causing immediate rash, facial/tongue/throat swelling, SOB or lightheadedness with hypotension: Yes Has patient had a PCN reaction causing severe rash involving mucus membranes or skin necrosis: No Has patient had a PCN  reaction that required hospitalization No Has patient had a PCN reaction occurring within the last 10 years: No If all of the above answers are "NO", then may proceed with Cephalosporin use.   Clarithromycin Nausea And Vomiting   Any antibiotic (doxyclycine, etc)   Adhesive [tape] Itching   Redness   Latex Itching, Rash   Blisters with prolonged contact   Moxifloxacin Nausea And Vomiting   Needs Zofran or Phenergan      Medication List    TAKE these medications   acetaminophen 500 MG tablet Commonly known as:  TYLENOL Take 1,000 mg by mouth every 6 (six) hours as needed.   ADULT ONE DAILY GUMMIES PO Take 2 tablets by mouth at bedtime.   ALPRAZolam 0.5 MG tablet Commonly known as:  XANAX Take 0.5 mg by mouth every 6 (six) hours as needed for anxiety or sleep.   ammonium lactate 12 % lotion Commonly known as:  LAC-HYDRIN Apply 1 application topically 3 (three) times daily as needed for dry skin.   aspirin EC 81 MG tablet Take 81 mg by mouth daily.   atorvastatin 20 MG tablet Commonly known as:  LIPITOR Take 20 mg by mouth daily at 6 PM.   BAYER CONTOUR NEXT TEST test strip Generic drug:  glucose blood 1 each by Other route See admin instructions. Testing 3-6 times daily   Biotin 10000 MCG Tabs Take 10,000 mcg by mouth at bedtime.   cetirizine 10 MG tablet Commonly known as:  ZYRTEC Take 10 mg by mouth daily as needed for allergies.   clindamycin 300 MG capsule Commonly known as:  CLEOCIN Take 1 capsule (300 mg total) by mouth 3 (three) times daily for 28 days.   clotrimazole-betamethasone lotion Commonly known as:  LOTRISONE Apply 1 application topically 2 (two) times daily as needed. For skin folds   cyanocobalamin 1000 MCG/ML injection Commonly known as:  (VITAMIN B-12) Inject 1,000 mcg into the muscle every 30 (thirty) days.   cyclobenzaprine 10 MG tablet Commonly known as:  FLEXERIL Take 10 mg by mouth 3 (three) times daily as needed for muscle spasms  (scheduled each night at bedtime).   D3-50 50000 units capsule Generic drug:  Cholecalciferol Take 50,000 Units by mouth every Saturday.   DEXILANT 60 MG capsule Generic drug:  dexlansoprazole Take 60 mg by mouth daily at 6 (six) AM.   dicyclomine 10 MG capsule Commonly known as:  BENTYL Take 10 mg by mouth 2 (two) times daily.   doxycycline 20 MG tablet Commonly known as:  PERIOSTAT  Take 20 mg by mouth 2 (two) times daily as needed. For rosacea.   fluticasone 50 MCG/ACT nasal spray Commonly known as:  FLONASE Place 1 spray into the nose at bedtime. Reported on 12/27/2015   furosemide 40 MG tablet Commonly known as:  LASIX Take 40 mg by mouth 2 (two) times daily.   gabapentin 300 MG capsule Commonly known as:  NEURONTIN Take 2 capsules (600 mg total) by mouth 3 (three) times daily. What changed:  additional instructions   halobetasol 0.05 % cream Commonly known as:  ULTRAVATE Apply 1 application topically 2 (two) times daily as needed (for finger blistering).   HUMULIN R 500 UNIT/ML injection Generic drug:  insulin regular human CONCENTRATED Inject 2 Units into the skin See admin instructions. 2.0 units /hour 0500-2359, 1.95units 1610-9604 with carb ratio 1:50 for meal time bolus   HYDROcodone-acetaminophen 5-325 MG tablet Commonly known as:  NORCO Take 1 tablet by mouth every 6 (six) hours as needed for moderate pain. What changed:  You were already taking a medication with the same name, and this prescription was added. Make sure you understand how and when to take each.   HYDROcodone-acetaminophen 7.5-325 MG tablet Commonly known as:  NORCO Take 1-2 tablets by mouth every 6 (six) hours as needed for moderate pain (for pain.). What changed:    how much to take  reasons to take this   HYLATOPIC PLUS Crea Apply 1 application topically 4 (four) times daily as needed (for hands irriation.).   hyoscyamine 0.125 MG SL tablet Commonly known as:  LEVSIN SL Take 1-2  tablets sublingually every 6 hours as needed. What changed:    how much to take  how to take this  when to take this  reasons to take this  additional instructions   ibuprofen 200 MG tablet Commonly known as:  ADVIL,MOTRIN Take 600-800 mg by mouth every 8 (eight) hours as needed (for pain.).   insulin pump Soln Inject into the skin.   INVOKAMET 50-1000 MG Tabs Generic drug:  Canagliflozin-metFORMIN HCl Take 1 tablet by mouth 2 (two) times daily.   ketoconazole 2 % cream Commonly known as:  NIZORAL Apply 1 application topically 2 (two) times daily as needed. For tops of toes.   levothyroxine 137 MCG tablet Commonly known as:  SYNTHROID, LEVOTHROID Take 274 mcg by mouth daily before breakfast.   lisinopril 20 MG tablet Commonly known as:  PRINIVIL,ZESTRIL Take 40 mg by mouth 2 (two) times daily.   metoprolol succinate 100 MG 24 hr tablet Commonly known as:  TOPROL-XL Take 100 mg by mouth 2 (two) times daily. Take with or immediately following a meal.   metroNIDAZOLE 0.75 % cream Commonly known as:  METROCREAM Apply 1 application topically 2 (two) times daily as needed (for rosacea.).   mupirocin ointment 2 % Commonly known as:  BACTROBAN Apply 1 application topically 2 (two) times daily as needed. To left toe ulcer.   ondansetron 8 MG disintegrating tablet Commonly known as:  ZOFRAN-ODT Take 8 mg by mouth every 6 (six) hours as needed for nausea or vomiting. What changed:  Another medication with the same name was added. Make sure you understand how and when to take each.   ondansetron 4 MG disintegrating tablet Commonly known as:  ZOFRAN ODT Take 1 tablet (4 mg total) by mouth every 8 (eight) hours as needed. What changed:  You were already taking a medication with the same name, and this prescription was added. Make sure you understand  how and when to take each.   PROCTOSOL HC 2.5 % rectal cream Generic drug:  hydrocortisone Apply 1 application topically  daily as needed for hemorrhoids.   triamcinolone ointment 0.1 % Commonly known as:  KENALOG Apply 1 application topically 2 (two) times daily as needed (for finger blister).        Kathrynn Speed, MPAS Physician Assistant [place] is now [employer] [address], [address], [place], [place] [address] Phone: [phone] [email or web address] Facebook  [employer]

## 2018-04-05 NOTE — Progress Notes (Signed)
   Subjective: 2 Days Post-Op Procedure(s) (LRB): Arthroscopic versus open irrigation and debridement  left knee (Left)  Pain has improved over night  States the pain was severe and now it is moderate Denies any new symptoms or issues Continues on IV antibiotics Patient reports pain as moderate.  Objective:   VITALS:   Vitals:   04/05/18 0055 04/05/18 0642  BP: 140/79 136/72  Pulse: (!) 111 (!) 115  Resp:  20  Temp: 98.2 F (36.8 C) 97.8 F (36.6 C)  SpO2: 95% 93%    Left knee portal sites healing well No erythema or effusion Improved gentle rom nv intact distally  LABS Recent Labs    04/03/18 1013  HGB 11.7*  HCT 36.3  WBC 9.7  PLT 435*    Recent Labs    04/03/18 1013  NA 135  K 4.1  BUN 11  CREATININE 0.82  GLUCOSE 73     Assessment/Plan: 2 Days Post-Op Procedure(s) (LRB): Arthroscopic versus open irrigation and debridement  left knee (Left) Discussed case with Dr. Stann Mainland and recommend d/c home later this afternoon Continue with PT/OT today Continue IV antibiotics today F/u next week in the office  Pain management as needed    Kathrynn Speed, [place] is now [facility]  [place] [facility] [address] [place], [place],  [address] Phone: [phone] [email or web address] Facebook  [employer]

## 2018-04-05 NOTE — Progress Notes (Signed)
Inpatient Diabetes Program Recommendations  AACE/ADA: New Consensus Statement on Inpatient Glycemic Control (2015)  Target Ranges:  Prepandial:   less than 140 mg/dL      Peak postprandial:   less than 180 mg/dL (1-2 hours)      Critically ill patients:  140 - 180 mg/dL   Lab Results  Component Value Date   GLUCAP 84 04/05/2018   HGBA1C 6.8 (H) 04/03/2018    Review of Glycemic ControlResults for Jenna, Villar[place] (MRN [id number]) as of 04/05/2018 12:51  Ref. Range 04/04/2018 23:54 04/05/2018 03:11 04/05/2018 06:45 04/05/2018 08:19 04/05/2018 11:28  Glucose-Capillary Latest Ref Range: 65 - 99 mg/dL 68 84 69 75 84    Call received from RN, [name].  Patient with mild lows overnight.  She states that she discussed with patient and that patient reduced her basal rates due to blood sugars less than 100 mg/dL.  Note that patient has [name] sensor. Advised RN to let patient know to notify DM MD if blood sugars continue to be lower.   Thanks,  Adah Perl, RN, BC-ADM Inpatient Diabetes Coordinator Pager [phone]

## 2018-04-08 DIAGNOSIS — E669 Obesity, unspecified: Secondary | ICD-10-CM | POA: Diagnosis not present

## 2018-04-08 DIAGNOSIS — E7800 Pure hypercholesterolemia, unspecified: Secondary | ICD-10-CM | POA: Diagnosis not present

## 2018-04-08 DIAGNOSIS — D631 Anemia in chronic kidney disease: Secondary | ICD-10-CM | POA: Diagnosis not present

## 2018-04-08 DIAGNOSIS — E1122 Type 2 diabetes mellitus with diabetic chronic kidney disease: Secondary | ICD-10-CM | POA: Diagnosis not present

## 2018-04-08 DIAGNOSIS — J42 Unspecified chronic bronchitis: Secondary | ICD-10-CM | POA: Diagnosis not present

## 2018-04-08 DIAGNOSIS — I209 Angina pectoris, unspecified: Secondary | ICD-10-CM | POA: Diagnosis not present

## 2018-04-08 DIAGNOSIS — I129 Hypertensive chronic kidney disease with stage 1 through stage 4 chronic kidney disease, or unspecified chronic kidney disease: Secondary | ICD-10-CM | POA: Diagnosis not present

## 2018-04-08 DIAGNOSIS — R0600 Dyspnea, unspecified: Secondary | ICD-10-CM | POA: Diagnosis not present

## 2018-04-08 DIAGNOSIS — G6181 Chronic inflammatory demyelinating polyneuritis: Secondary | ICD-10-CM | POA: Diagnosis not present

## 2018-04-08 DIAGNOSIS — N189 Chronic kidney disease, unspecified: Secondary | ICD-10-CM | POA: Diagnosis not present

## 2018-04-08 DIAGNOSIS — M00062 Staphylococcal arthritis, left knee: Secondary | ICD-10-CM | POA: Diagnosis not present

## 2018-04-08 DIAGNOSIS — B9561 Methicillin susceptible Staphylococcus aureus infection as the cause of diseases classified elsewhere: Secondary | ICD-10-CM | POA: Diagnosis not present

## 2018-04-08 DIAGNOSIS — D509 Iron deficiency anemia, unspecified: Secondary | ICD-10-CM | POA: Diagnosis not present

## 2018-04-09 DIAGNOSIS — G6181 Chronic inflammatory demyelinating polyneuritis: Secondary | ICD-10-CM | POA: Diagnosis not present

## 2018-04-09 DIAGNOSIS — M00062 Staphylococcal arthritis, left knee: Secondary | ICD-10-CM | POA: Diagnosis not present

## 2018-04-09 DIAGNOSIS — B9561 Methicillin susceptible Staphylococcus aureus infection as the cause of diseases classified elsewhere: Secondary | ICD-10-CM | POA: Diagnosis not present

## 2018-04-09 DIAGNOSIS — E669 Obesity, unspecified: Secondary | ICD-10-CM | POA: Diagnosis not present

## 2018-04-09 DIAGNOSIS — D509 Iron deficiency anemia, unspecified: Secondary | ICD-10-CM | POA: Diagnosis not present

## 2018-04-09 DIAGNOSIS — J42 Unspecified chronic bronchitis: Secondary | ICD-10-CM | POA: Diagnosis not present

## 2018-04-09 DIAGNOSIS — I129 Hypertensive chronic kidney disease with stage 1 through stage 4 chronic kidney disease, or unspecified chronic kidney disease: Secondary | ICD-10-CM | POA: Diagnosis not present

## 2018-04-09 DIAGNOSIS — D631 Anemia in chronic kidney disease: Secondary | ICD-10-CM | POA: Diagnosis not present

## 2018-04-09 DIAGNOSIS — I209 Angina pectoris, unspecified: Secondary | ICD-10-CM | POA: Diagnosis not present

## 2018-04-09 DIAGNOSIS — E1122 Type 2 diabetes mellitus with diabetic chronic kidney disease: Secondary | ICD-10-CM | POA: Diagnosis not present

## 2018-04-09 DIAGNOSIS — N189 Chronic kidney disease, unspecified: Secondary | ICD-10-CM | POA: Diagnosis not present

## 2018-04-09 DIAGNOSIS — E7800 Pure hypercholesterolemia, unspecified: Secondary | ICD-10-CM | POA: Diagnosis not present

## 2018-04-09 DIAGNOSIS — R0600 Dyspnea, unspecified: Secondary | ICD-10-CM | POA: Diagnosis not present

## 2018-04-10 DIAGNOSIS — D631 Anemia in chronic kidney disease: Secondary | ICD-10-CM | POA: Diagnosis not present

## 2018-04-10 DIAGNOSIS — E669 Obesity, unspecified: Secondary | ICD-10-CM | POA: Diagnosis not present

## 2018-04-10 DIAGNOSIS — E1122 Type 2 diabetes mellitus with diabetic chronic kidney disease: Secondary | ICD-10-CM | POA: Diagnosis not present

## 2018-04-10 DIAGNOSIS — G6181 Chronic inflammatory demyelinating polyneuritis: Secondary | ICD-10-CM | POA: Diagnosis not present

## 2018-04-10 DIAGNOSIS — B9561 Methicillin susceptible Staphylococcus aureus infection as the cause of diseases classified elsewhere: Secondary | ICD-10-CM | POA: Diagnosis not present

## 2018-04-10 DIAGNOSIS — N189 Chronic kidney disease, unspecified: Secondary | ICD-10-CM | POA: Diagnosis not present

## 2018-04-10 DIAGNOSIS — D509 Iron deficiency anemia, unspecified: Secondary | ICD-10-CM | POA: Diagnosis not present

## 2018-04-10 DIAGNOSIS — I129 Hypertensive chronic kidney disease with stage 1 through stage 4 chronic kidney disease, or unspecified chronic kidney disease: Secondary | ICD-10-CM | POA: Diagnosis not present

## 2018-04-10 DIAGNOSIS — R0600 Dyspnea, unspecified: Secondary | ICD-10-CM | POA: Diagnosis not present

## 2018-04-10 DIAGNOSIS — E7800 Pure hypercholesterolemia, unspecified: Secondary | ICD-10-CM | POA: Diagnosis not present

## 2018-04-10 DIAGNOSIS — M00062 Staphylococcal arthritis, left knee: Secondary | ICD-10-CM | POA: Diagnosis not present

## 2018-04-10 DIAGNOSIS — J42 Unspecified chronic bronchitis: Secondary | ICD-10-CM | POA: Diagnosis not present

## 2018-04-10 DIAGNOSIS — I209 Angina pectoris, unspecified: Secondary | ICD-10-CM | POA: Diagnosis not present

## 2018-04-14 DIAGNOSIS — I209 Angina pectoris, unspecified: Secondary | ICD-10-CM | POA: Diagnosis not present

## 2018-04-14 DIAGNOSIS — E1122 Type 2 diabetes mellitus with diabetic chronic kidney disease: Secondary | ICD-10-CM | POA: Diagnosis not present

## 2018-04-14 DIAGNOSIS — D509 Iron deficiency anemia, unspecified: Secondary | ICD-10-CM | POA: Diagnosis not present

## 2018-04-14 DIAGNOSIS — B9561 Methicillin susceptible Staphylococcus aureus infection as the cause of diseases classified elsewhere: Secondary | ICD-10-CM | POA: Diagnosis not present

## 2018-04-14 DIAGNOSIS — E7800 Pure hypercholesterolemia, unspecified: Secondary | ICD-10-CM | POA: Diagnosis not present

## 2018-04-14 DIAGNOSIS — E669 Obesity, unspecified: Secondary | ICD-10-CM | POA: Diagnosis not present

## 2018-04-14 DIAGNOSIS — N189 Chronic kidney disease, unspecified: Secondary | ICD-10-CM | POA: Diagnosis not present

## 2018-04-14 DIAGNOSIS — I129 Hypertensive chronic kidney disease with stage 1 through stage 4 chronic kidney disease, or unspecified chronic kidney disease: Secondary | ICD-10-CM | POA: Diagnosis not present

## 2018-04-14 DIAGNOSIS — D631 Anemia in chronic kidney disease: Secondary | ICD-10-CM | POA: Diagnosis not present

## 2018-04-14 DIAGNOSIS — J42 Unspecified chronic bronchitis: Secondary | ICD-10-CM | POA: Diagnosis not present

## 2018-04-14 DIAGNOSIS — G6181 Chronic inflammatory demyelinating polyneuritis: Secondary | ICD-10-CM | POA: Diagnosis not present

## 2018-04-14 DIAGNOSIS — R0600 Dyspnea, unspecified: Secondary | ICD-10-CM | POA: Diagnosis not present

## 2018-04-14 DIAGNOSIS — M00062 Staphylococcal arthritis, left knee: Secondary | ICD-10-CM | POA: Diagnosis not present

## 2018-04-15 DIAGNOSIS — M00062 Staphylococcal arthritis, left knee: Secondary | ICD-10-CM | POA: Diagnosis not present

## 2018-04-15 DIAGNOSIS — J42 Unspecified chronic bronchitis: Secondary | ICD-10-CM | POA: Diagnosis not present

## 2018-04-15 DIAGNOSIS — I209 Angina pectoris, unspecified: Secondary | ICD-10-CM | POA: Diagnosis not present

## 2018-04-15 DIAGNOSIS — E1122 Type 2 diabetes mellitus with diabetic chronic kidney disease: Secondary | ICD-10-CM | POA: Diagnosis not present

## 2018-04-15 DIAGNOSIS — B9561 Methicillin susceptible Staphylococcus aureus infection as the cause of diseases classified elsewhere: Secondary | ICD-10-CM | POA: Diagnosis not present

## 2018-04-15 DIAGNOSIS — I129 Hypertensive chronic kidney disease with stage 1 through stage 4 chronic kidney disease, or unspecified chronic kidney disease: Secondary | ICD-10-CM | POA: Diagnosis not present

## 2018-04-15 DIAGNOSIS — R0600 Dyspnea, unspecified: Secondary | ICD-10-CM | POA: Diagnosis not present

## 2018-04-15 DIAGNOSIS — G6181 Chronic inflammatory demyelinating polyneuritis: Secondary | ICD-10-CM | POA: Diagnosis not present

## 2018-04-15 DIAGNOSIS — N189 Chronic kidney disease, unspecified: Secondary | ICD-10-CM | POA: Diagnosis not present

## 2018-04-15 DIAGNOSIS — E669 Obesity, unspecified: Secondary | ICD-10-CM | POA: Diagnosis not present

## 2018-04-15 DIAGNOSIS — E7800 Pure hypercholesterolemia, unspecified: Secondary | ICD-10-CM | POA: Diagnosis not present

## 2018-04-15 DIAGNOSIS — D509 Iron deficiency anemia, unspecified: Secondary | ICD-10-CM | POA: Diagnosis not present

## 2018-04-15 DIAGNOSIS — D631 Anemia in chronic kidney disease: Secondary | ICD-10-CM | POA: Diagnosis not present

## 2018-04-17 DIAGNOSIS — E1129 Type 2 diabetes mellitus with other diabetic kidney complication: Secondary | ICD-10-CM | POA: Diagnosis not present

## 2018-04-17 DIAGNOSIS — N182 Chronic kidney disease, stage 2 (mild): Secondary | ICD-10-CM | POA: Diagnosis not present

## 2018-04-17 DIAGNOSIS — Z4681 Encounter for fitting and adjustment of insulin pump: Secondary | ICD-10-CM | POA: Diagnosis not present

## 2018-04-17 DIAGNOSIS — I10 Essential (primary) hypertension: Secondary | ICD-10-CM | POA: Diagnosis not present

## 2018-04-18 DIAGNOSIS — N189 Chronic kidney disease, unspecified: Secondary | ICD-10-CM | POA: Diagnosis not present

## 2018-04-18 DIAGNOSIS — I209 Angina pectoris, unspecified: Secondary | ICD-10-CM | POA: Diagnosis not present

## 2018-04-18 DIAGNOSIS — R0600 Dyspnea, unspecified: Secondary | ICD-10-CM | POA: Diagnosis not present

## 2018-04-18 DIAGNOSIS — J42 Unspecified chronic bronchitis: Secondary | ICD-10-CM | POA: Diagnosis not present

## 2018-04-18 DIAGNOSIS — E669 Obesity, unspecified: Secondary | ICD-10-CM | POA: Diagnosis not present

## 2018-04-18 DIAGNOSIS — D509 Iron deficiency anemia, unspecified: Secondary | ICD-10-CM | POA: Diagnosis not present

## 2018-04-18 DIAGNOSIS — B9561 Methicillin susceptible Staphylococcus aureus infection as the cause of diseases classified elsewhere: Secondary | ICD-10-CM | POA: Diagnosis not present

## 2018-04-18 DIAGNOSIS — E1122 Type 2 diabetes mellitus with diabetic chronic kidney disease: Secondary | ICD-10-CM | POA: Diagnosis not present

## 2018-04-18 DIAGNOSIS — D631 Anemia in chronic kidney disease: Secondary | ICD-10-CM | POA: Diagnosis not present

## 2018-04-18 DIAGNOSIS — M00062 Staphylococcal arthritis, left knee: Secondary | ICD-10-CM | POA: Diagnosis not present

## 2018-04-18 DIAGNOSIS — E7800 Pure hypercholesterolemia, unspecified: Secondary | ICD-10-CM | POA: Diagnosis not present

## 2018-04-18 DIAGNOSIS — G6181 Chronic inflammatory demyelinating polyneuritis: Secondary | ICD-10-CM | POA: Diagnosis not present

## 2018-04-18 DIAGNOSIS — I129 Hypertensive chronic kidney disease with stage 1 through stage 4 chronic kidney disease, or unspecified chronic kidney disease: Secondary | ICD-10-CM | POA: Diagnosis not present

## 2018-04-21 DIAGNOSIS — I209 Angina pectoris, unspecified: Secondary | ICD-10-CM | POA: Diagnosis not present

## 2018-04-21 DIAGNOSIS — J42 Unspecified chronic bronchitis: Secondary | ICD-10-CM | POA: Diagnosis not present

## 2018-04-21 DIAGNOSIS — E1122 Type 2 diabetes mellitus with diabetic chronic kidney disease: Secondary | ICD-10-CM | POA: Diagnosis not present

## 2018-04-21 DIAGNOSIS — R0600 Dyspnea, unspecified: Secondary | ICD-10-CM | POA: Diagnosis not present

## 2018-04-21 DIAGNOSIS — M00062 Staphylococcal arthritis, left knee: Secondary | ICD-10-CM | POA: Diagnosis not present

## 2018-04-21 DIAGNOSIS — B9561 Methicillin susceptible Staphylococcus aureus infection as the cause of diseases classified elsewhere: Secondary | ICD-10-CM | POA: Diagnosis not present

## 2018-04-21 DIAGNOSIS — N189 Chronic kidney disease, unspecified: Secondary | ICD-10-CM | POA: Diagnosis not present

## 2018-04-21 DIAGNOSIS — D509 Iron deficiency anemia, unspecified: Secondary | ICD-10-CM | POA: Diagnosis not present

## 2018-04-21 DIAGNOSIS — E7800 Pure hypercholesterolemia, unspecified: Secondary | ICD-10-CM | POA: Diagnosis not present

## 2018-04-21 DIAGNOSIS — E669 Obesity, unspecified: Secondary | ICD-10-CM | POA: Diagnosis not present

## 2018-04-21 DIAGNOSIS — I129 Hypertensive chronic kidney disease with stage 1 through stage 4 chronic kidney disease, or unspecified chronic kidney disease: Secondary | ICD-10-CM | POA: Diagnosis not present

## 2018-04-21 DIAGNOSIS — D631 Anemia in chronic kidney disease: Secondary | ICD-10-CM | POA: Diagnosis not present

## 2018-04-21 DIAGNOSIS — G6181 Chronic inflammatory demyelinating polyneuritis: Secondary | ICD-10-CM | POA: Diagnosis not present

## 2018-04-23 DIAGNOSIS — J42 Unspecified chronic bronchitis: Secondary | ICD-10-CM | POA: Diagnosis not present

## 2018-04-23 DIAGNOSIS — I129 Hypertensive chronic kidney disease with stage 1 through stage 4 chronic kidney disease, or unspecified chronic kidney disease: Secondary | ICD-10-CM | POA: Diagnosis not present

## 2018-04-23 DIAGNOSIS — I209 Angina pectoris, unspecified: Secondary | ICD-10-CM | POA: Diagnosis not present

## 2018-04-23 DIAGNOSIS — R0600 Dyspnea, unspecified: Secondary | ICD-10-CM | POA: Diagnosis not present

## 2018-04-23 DIAGNOSIS — B9561 Methicillin susceptible Staphylococcus aureus infection as the cause of diseases classified elsewhere: Secondary | ICD-10-CM | POA: Diagnosis not present

## 2018-04-23 DIAGNOSIS — D631 Anemia in chronic kidney disease: Secondary | ICD-10-CM | POA: Diagnosis not present

## 2018-04-23 DIAGNOSIS — N189 Chronic kidney disease, unspecified: Secondary | ICD-10-CM | POA: Diagnosis not present

## 2018-04-23 DIAGNOSIS — E669 Obesity, unspecified: Secondary | ICD-10-CM | POA: Diagnosis not present

## 2018-04-23 DIAGNOSIS — G6181 Chronic inflammatory demyelinating polyneuritis: Secondary | ICD-10-CM | POA: Diagnosis not present

## 2018-04-23 DIAGNOSIS — D509 Iron deficiency anemia, unspecified: Secondary | ICD-10-CM | POA: Diagnosis not present

## 2018-04-23 DIAGNOSIS — E1122 Type 2 diabetes mellitus with diabetic chronic kidney disease: Secondary | ICD-10-CM | POA: Diagnosis not present

## 2018-04-23 DIAGNOSIS — M00062 Staphylococcal arthritis, left knee: Secondary | ICD-10-CM | POA: Diagnosis not present

## 2018-04-23 DIAGNOSIS — E7800 Pure hypercholesterolemia, unspecified: Secondary | ICD-10-CM | POA: Diagnosis not present

## 2018-04-24 DIAGNOSIS — G6181 Chronic inflammatory demyelinating polyneuritis: Secondary | ICD-10-CM | POA: Diagnosis not present

## 2018-04-28 DIAGNOSIS — M00062 Staphylococcal arthritis, left knee: Secondary | ICD-10-CM | POA: Diagnosis not present

## 2018-04-28 DIAGNOSIS — E7800 Pure hypercholesterolemia, unspecified: Secondary | ICD-10-CM | POA: Diagnosis not present

## 2018-04-28 DIAGNOSIS — R0600 Dyspnea, unspecified: Secondary | ICD-10-CM | POA: Diagnosis not present

## 2018-04-28 DIAGNOSIS — N189 Chronic kidney disease, unspecified: Secondary | ICD-10-CM | POA: Diagnosis not present

## 2018-04-28 DIAGNOSIS — E669 Obesity, unspecified: Secondary | ICD-10-CM | POA: Diagnosis not present

## 2018-04-28 DIAGNOSIS — I209 Angina pectoris, unspecified: Secondary | ICD-10-CM | POA: Diagnosis not present

## 2018-04-28 DIAGNOSIS — I129 Hypertensive chronic kidney disease with stage 1 through stage 4 chronic kidney disease, or unspecified chronic kidney disease: Secondary | ICD-10-CM | POA: Diagnosis not present

## 2018-04-28 DIAGNOSIS — D631 Anemia in chronic kidney disease: Secondary | ICD-10-CM | POA: Diagnosis not present

## 2018-04-28 DIAGNOSIS — D509 Iron deficiency anemia, unspecified: Secondary | ICD-10-CM | POA: Diagnosis not present

## 2018-04-28 DIAGNOSIS — E1122 Type 2 diabetes mellitus with diabetic chronic kidney disease: Secondary | ICD-10-CM | POA: Diagnosis not present

## 2018-04-28 DIAGNOSIS — G6181 Chronic inflammatory demyelinating polyneuritis: Secondary | ICD-10-CM | POA: Diagnosis not present

## 2018-04-28 DIAGNOSIS — J42 Unspecified chronic bronchitis: Secondary | ICD-10-CM | POA: Diagnosis not present

## 2018-04-28 DIAGNOSIS — B9561 Methicillin susceptible Staphylococcus aureus infection as the cause of diseases classified elsewhere: Secondary | ICD-10-CM | POA: Diagnosis not present

## 2018-04-29 DIAGNOSIS — E1122 Type 2 diabetes mellitus with diabetic chronic kidney disease: Secondary | ICD-10-CM | POA: Diagnosis not present

## 2018-04-29 DIAGNOSIS — E7800 Pure hypercholesterolemia, unspecified: Secondary | ICD-10-CM | POA: Diagnosis not present

## 2018-04-29 DIAGNOSIS — N189 Chronic kidney disease, unspecified: Secondary | ICD-10-CM | POA: Diagnosis not present

## 2018-04-29 DIAGNOSIS — B9561 Methicillin susceptible Staphylococcus aureus infection as the cause of diseases classified elsewhere: Secondary | ICD-10-CM | POA: Diagnosis not present

## 2018-04-29 DIAGNOSIS — D509 Iron deficiency anemia, unspecified: Secondary | ICD-10-CM | POA: Diagnosis not present

## 2018-04-29 DIAGNOSIS — E669 Obesity, unspecified: Secondary | ICD-10-CM | POA: Diagnosis not present

## 2018-04-29 DIAGNOSIS — J42 Unspecified chronic bronchitis: Secondary | ICD-10-CM | POA: Diagnosis not present

## 2018-04-29 DIAGNOSIS — R0600 Dyspnea, unspecified: Secondary | ICD-10-CM | POA: Diagnosis not present

## 2018-04-29 DIAGNOSIS — D631 Anemia in chronic kidney disease: Secondary | ICD-10-CM | POA: Diagnosis not present

## 2018-04-29 DIAGNOSIS — I129 Hypertensive chronic kidney disease with stage 1 through stage 4 chronic kidney disease, or unspecified chronic kidney disease: Secondary | ICD-10-CM | POA: Diagnosis not present

## 2018-04-29 DIAGNOSIS — I209 Angina pectoris, unspecified: Secondary | ICD-10-CM | POA: Diagnosis not present

## 2018-04-29 DIAGNOSIS — M00062 Staphylococcal arthritis, left knee: Secondary | ICD-10-CM | POA: Diagnosis not present

## 2018-04-29 DIAGNOSIS — G6181 Chronic inflammatory demyelinating polyneuritis: Secondary | ICD-10-CM | POA: Diagnosis not present

## 2018-04-30 ENCOUNTER — Telehealth: Payer: Self-pay | Admitting: Neurology

## 2018-04-30 NOTE — Telephone Encounter (Signed)
Patient wants to talk to someone about electric wheelchair

## 2018-04-30 NOTE — Telephone Encounter (Signed)
Can I go ahead and set something up with Andria Rhein?

## 2018-05-01 DIAGNOSIS — E7800 Pure hypercholesterolemia, unspecified: Secondary | ICD-10-CM | POA: Diagnosis not present

## 2018-05-01 DIAGNOSIS — G6181 Chronic inflammatory demyelinating polyneuritis: Secondary | ICD-10-CM | POA: Diagnosis not present

## 2018-05-01 DIAGNOSIS — E669 Obesity, unspecified: Secondary | ICD-10-CM | POA: Diagnosis not present

## 2018-05-01 DIAGNOSIS — R0600 Dyspnea, unspecified: Secondary | ICD-10-CM | POA: Diagnosis not present

## 2018-05-01 DIAGNOSIS — J42 Unspecified chronic bronchitis: Secondary | ICD-10-CM | POA: Diagnosis not present

## 2018-05-01 DIAGNOSIS — N189 Chronic kidney disease, unspecified: Secondary | ICD-10-CM | POA: Diagnosis not present

## 2018-05-01 DIAGNOSIS — B9561 Methicillin susceptible Staphylococcus aureus infection as the cause of diseases classified elsewhere: Secondary | ICD-10-CM | POA: Diagnosis not present

## 2018-05-01 DIAGNOSIS — M00062 Staphylococcal arthritis, left knee: Secondary | ICD-10-CM | POA: Diagnosis not present

## 2018-05-01 DIAGNOSIS — I129 Hypertensive chronic kidney disease with stage 1 through stage 4 chronic kidney disease, or unspecified chronic kidney disease: Secondary | ICD-10-CM | POA: Diagnosis not present

## 2018-05-01 DIAGNOSIS — I209 Angina pectoris, unspecified: Secondary | ICD-10-CM | POA: Diagnosis not present

## 2018-05-01 DIAGNOSIS — E1122 Type 2 diabetes mellitus with diabetic chronic kidney disease: Secondary | ICD-10-CM | POA: Diagnosis not present

## 2018-05-01 DIAGNOSIS — D509 Iron deficiency anemia, unspecified: Secondary | ICD-10-CM | POA: Diagnosis not present

## 2018-05-01 DIAGNOSIS — D631 Anemia in chronic kidney disease: Secondary | ICD-10-CM | POA: Diagnosis not present

## 2018-05-01 NOTE — Telephone Encounter (Signed)
Yes, please set up

## 2018-05-05 ENCOUNTER — Encounter: Payer: Self-pay | Admitting: Neurology

## 2018-05-06 ENCOUNTER — Other Ambulatory Visit: Payer: Self-pay | Admitting: *Deleted

## 2018-05-06 ENCOUNTER — Encounter: Payer: Self-pay | Admitting: *Deleted

## 2018-05-06 DIAGNOSIS — R29898 Other symptoms and signs involving the musculoskeletal system: Secondary | ICD-10-CM

## 2018-05-06 DIAGNOSIS — G8220 Paraplegia, unspecified: Secondary | ICD-10-CM

## 2018-05-06 DIAGNOSIS — E1122 Type 2 diabetes mellitus with diabetic chronic kidney disease: Secondary | ICD-10-CM | POA: Diagnosis not present

## 2018-05-06 DIAGNOSIS — D631 Anemia in chronic kidney disease: Secondary | ICD-10-CM | POA: Diagnosis not present

## 2018-05-06 DIAGNOSIS — E669 Obesity, unspecified: Secondary | ICD-10-CM | POA: Diagnosis not present

## 2018-05-06 DIAGNOSIS — G6181 Chronic inflammatory demyelinating polyneuritis: Secondary | ICD-10-CM

## 2018-05-06 DIAGNOSIS — B9561 Methicillin susceptible Staphylococcus aureus infection as the cause of diseases classified elsewhere: Secondary | ICD-10-CM | POA: Diagnosis not present

## 2018-05-06 DIAGNOSIS — R0600 Dyspnea, unspecified: Secondary | ICD-10-CM | POA: Diagnosis not present

## 2018-05-06 DIAGNOSIS — I209 Angina pectoris, unspecified: Secondary | ICD-10-CM | POA: Diagnosis not present

## 2018-05-06 DIAGNOSIS — M00062 Staphylococcal arthritis, left knee: Secondary | ICD-10-CM | POA: Diagnosis not present

## 2018-05-06 DIAGNOSIS — N189 Chronic kidney disease, unspecified: Secondary | ICD-10-CM | POA: Diagnosis not present

## 2018-05-06 DIAGNOSIS — E7800 Pure hypercholesterolemia, unspecified: Secondary | ICD-10-CM | POA: Diagnosis not present

## 2018-05-06 DIAGNOSIS — J42 Unspecified chronic bronchitis: Secondary | ICD-10-CM | POA: Diagnosis not present

## 2018-05-06 DIAGNOSIS — D509 Iron deficiency anemia, unspecified: Secondary | ICD-10-CM | POA: Diagnosis not present

## 2018-05-06 DIAGNOSIS — I129 Hypertensive chronic kidney disease with stage 1 through stage 4 chronic kidney disease, or unspecified chronic kidney disease: Secondary | ICD-10-CM | POA: Diagnosis not present

## 2018-05-06 DIAGNOSIS — G8314 Monoplegia of lower limb affecting left nondominant side: Secondary | ICD-10-CM

## 2018-05-06 MED ORDER — AMBULATORY NON FORMULARY MEDICATION: 1.0000 [IU] | Freq: Every day | 0 refills | Status: DC

## 2018-05-06 NOTE — Telephone Encounter (Signed)
I have spoken with patient.  I will set up appointment with Andria Rhein.

## 2018-05-09 ENCOUNTER — Encounter: Payer: Self-pay | Admitting: Gastroenterology

## 2018-05-09 DIAGNOSIS — R0600 Dyspnea, unspecified: Secondary | ICD-10-CM | POA: Diagnosis not present

## 2018-05-09 DIAGNOSIS — J42 Unspecified chronic bronchitis: Secondary | ICD-10-CM | POA: Diagnosis not present

## 2018-05-09 DIAGNOSIS — G6181 Chronic inflammatory demyelinating polyneuritis: Secondary | ICD-10-CM | POA: Diagnosis not present

## 2018-05-09 DIAGNOSIS — N189 Chronic kidney disease, unspecified: Secondary | ICD-10-CM | POA: Diagnosis not present

## 2018-05-09 DIAGNOSIS — M00062 Staphylococcal arthritis, left knee: Secondary | ICD-10-CM | POA: Diagnosis not present

## 2018-05-09 DIAGNOSIS — I129 Hypertensive chronic kidney disease with stage 1 through stage 4 chronic kidney disease, or unspecified chronic kidney disease: Secondary | ICD-10-CM | POA: Diagnosis not present

## 2018-05-09 DIAGNOSIS — D631 Anemia in chronic kidney disease: Secondary | ICD-10-CM | POA: Diagnosis not present

## 2018-05-09 DIAGNOSIS — E669 Obesity, unspecified: Secondary | ICD-10-CM | POA: Diagnosis not present

## 2018-05-09 DIAGNOSIS — E1122 Type 2 diabetes mellitus with diabetic chronic kidney disease: Secondary | ICD-10-CM | POA: Diagnosis not present

## 2018-05-09 DIAGNOSIS — D509 Iron deficiency anemia, unspecified: Secondary | ICD-10-CM | POA: Diagnosis not present

## 2018-05-09 DIAGNOSIS — I209 Angina pectoris, unspecified: Secondary | ICD-10-CM | POA: Diagnosis not present

## 2018-05-09 DIAGNOSIS — B9561 Methicillin susceptible Staphylococcus aureus infection as the cause of diseases classified elsewhere: Secondary | ICD-10-CM | POA: Diagnosis not present

## 2018-05-09 DIAGNOSIS — E7800 Pure hypercholesterolemia, unspecified: Secondary | ICD-10-CM | POA: Diagnosis not present

## 2018-05-12 ENCOUNTER — Other Ambulatory Visit: Payer: Self-pay | Admitting: Neurology

## 2018-05-13 DIAGNOSIS — I129 Hypertensive chronic kidney disease with stage 1 through stage 4 chronic kidney disease, or unspecified chronic kidney disease: Secondary | ICD-10-CM | POA: Diagnosis not present

## 2018-05-13 DIAGNOSIS — G6181 Chronic inflammatory demyelinating polyneuritis: Secondary | ICD-10-CM | POA: Diagnosis not present

## 2018-05-13 DIAGNOSIS — I209 Angina pectoris, unspecified: Secondary | ICD-10-CM | POA: Diagnosis not present

## 2018-05-13 DIAGNOSIS — N189 Chronic kidney disease, unspecified: Secondary | ICD-10-CM | POA: Diagnosis not present

## 2018-05-13 DIAGNOSIS — J42 Unspecified chronic bronchitis: Secondary | ICD-10-CM | POA: Diagnosis not present

## 2018-05-13 DIAGNOSIS — E669 Obesity, unspecified: Secondary | ICD-10-CM | POA: Diagnosis not present

## 2018-05-13 DIAGNOSIS — E1122 Type 2 diabetes mellitus with diabetic chronic kidney disease: Secondary | ICD-10-CM | POA: Diagnosis not present

## 2018-05-13 DIAGNOSIS — B9561 Methicillin susceptible Staphylococcus aureus infection as the cause of diseases classified elsewhere: Secondary | ICD-10-CM | POA: Diagnosis not present

## 2018-05-13 DIAGNOSIS — M00062 Staphylococcal arthritis, left knee: Secondary | ICD-10-CM | POA: Diagnosis not present

## 2018-05-13 DIAGNOSIS — D509 Iron deficiency anemia, unspecified: Secondary | ICD-10-CM | POA: Diagnosis not present

## 2018-05-13 DIAGNOSIS — D631 Anemia in chronic kidney disease: Secondary | ICD-10-CM | POA: Diagnosis not present

## 2018-05-13 DIAGNOSIS — R0600 Dyspnea, unspecified: Secondary | ICD-10-CM | POA: Diagnosis not present

## 2018-05-13 DIAGNOSIS — E7800 Pure hypercholesterolemia, unspecified: Secondary | ICD-10-CM | POA: Diagnosis not present

## 2018-05-14 DIAGNOSIS — E11621 Type 2 diabetes mellitus with foot ulcer: Secondary | ICD-10-CM | POA: Diagnosis not present

## 2018-05-14 DIAGNOSIS — Z1389 Encounter for screening for other disorder: Secondary | ICD-10-CM | POA: Diagnosis not present

## 2018-05-14 DIAGNOSIS — E1142 Type 2 diabetes mellitus with diabetic polyneuropathy: Secondary | ICD-10-CM | POA: Diagnosis not present

## 2018-05-14 DIAGNOSIS — N182 Chronic kidney disease, stage 2 (mild): Secondary | ICD-10-CM | POA: Diagnosis not present

## 2018-05-14 DIAGNOSIS — E1129 Type 2 diabetes mellitus with other diabetic kidney complication: Secondary | ICD-10-CM | POA: Diagnosis not present

## 2018-05-15 DIAGNOSIS — N189 Chronic kidney disease, unspecified: Secondary | ICD-10-CM | POA: Diagnosis not present

## 2018-05-15 DIAGNOSIS — J42 Unspecified chronic bronchitis: Secondary | ICD-10-CM | POA: Diagnosis not present

## 2018-05-15 DIAGNOSIS — M00062 Staphylococcal arthritis, left knee: Secondary | ICD-10-CM | POA: Diagnosis not present

## 2018-05-15 DIAGNOSIS — E1122 Type 2 diabetes mellitus with diabetic chronic kidney disease: Secondary | ICD-10-CM | POA: Diagnosis not present

## 2018-05-15 DIAGNOSIS — E669 Obesity, unspecified: Secondary | ICD-10-CM | POA: Diagnosis not present

## 2018-05-15 DIAGNOSIS — I129 Hypertensive chronic kidney disease with stage 1 through stage 4 chronic kidney disease, or unspecified chronic kidney disease: Secondary | ICD-10-CM | POA: Diagnosis not present

## 2018-05-15 DIAGNOSIS — B9561 Methicillin susceptible Staphylococcus aureus infection as the cause of diseases classified elsewhere: Secondary | ICD-10-CM | POA: Diagnosis not present

## 2018-05-15 DIAGNOSIS — D509 Iron deficiency anemia, unspecified: Secondary | ICD-10-CM | POA: Diagnosis not present

## 2018-05-15 DIAGNOSIS — G6181 Chronic inflammatory demyelinating polyneuritis: Secondary | ICD-10-CM | POA: Diagnosis not present

## 2018-05-15 DIAGNOSIS — R0600 Dyspnea, unspecified: Secondary | ICD-10-CM | POA: Diagnosis not present

## 2018-05-15 DIAGNOSIS — D631 Anemia in chronic kidney disease: Secondary | ICD-10-CM | POA: Diagnosis not present

## 2018-05-15 DIAGNOSIS — E7800 Pure hypercholesterolemia, unspecified: Secondary | ICD-10-CM | POA: Diagnosis not present

## 2018-05-15 DIAGNOSIS — I209 Angina pectoris, unspecified: Secondary | ICD-10-CM | POA: Diagnosis not present

## 2018-05-16 ENCOUNTER — Telehealth: Payer: Self-pay | Admitting: Neurology

## 2018-05-16 DIAGNOSIS — E1165 Type 2 diabetes mellitus with hyperglycemia: Secondary | ICD-10-CM | POA: Diagnosis not present

## 2018-05-16 NOTE — Telephone Encounter (Signed)
I spoke with patient and informed her that I spoke with Jackelyn Poling and she is working on getting the wheelchair.

## 2018-05-16 NOTE — Telephone Encounter (Signed)
Patient needs to talk to someone about her B-12 injection and then the power wheelchair ( no one has contacted her about that ) please call

## 2018-05-19 DIAGNOSIS — J42 Unspecified chronic bronchitis: Secondary | ICD-10-CM | POA: Diagnosis not present

## 2018-05-19 DIAGNOSIS — B9561 Methicillin susceptible Staphylococcus aureus infection as the cause of diseases classified elsewhere: Secondary | ICD-10-CM | POA: Diagnosis not present

## 2018-05-19 DIAGNOSIS — I129 Hypertensive chronic kidney disease with stage 1 through stage 4 chronic kidney disease, or unspecified chronic kidney disease: Secondary | ICD-10-CM | POA: Diagnosis not present

## 2018-05-19 DIAGNOSIS — N189 Chronic kidney disease, unspecified: Secondary | ICD-10-CM | POA: Diagnosis not present

## 2018-05-19 DIAGNOSIS — D631 Anemia in chronic kidney disease: Secondary | ICD-10-CM | POA: Diagnosis not present

## 2018-05-19 DIAGNOSIS — D509 Iron deficiency anemia, unspecified: Secondary | ICD-10-CM | POA: Diagnosis not present

## 2018-05-19 DIAGNOSIS — E1122 Type 2 diabetes mellitus with diabetic chronic kidney disease: Secondary | ICD-10-CM | POA: Diagnosis not present

## 2018-05-19 DIAGNOSIS — I209 Angina pectoris, unspecified: Secondary | ICD-10-CM | POA: Diagnosis not present

## 2018-05-19 DIAGNOSIS — M00062 Staphylococcal arthritis, left knee: Secondary | ICD-10-CM | POA: Diagnosis not present

## 2018-05-19 DIAGNOSIS — G6181 Chronic inflammatory demyelinating polyneuritis: Secondary | ICD-10-CM | POA: Diagnosis not present

## 2018-05-19 DIAGNOSIS — E7800 Pure hypercholesterolemia, unspecified: Secondary | ICD-10-CM | POA: Diagnosis not present

## 2018-05-19 DIAGNOSIS — R0600 Dyspnea, unspecified: Secondary | ICD-10-CM | POA: Diagnosis not present

## 2018-05-19 DIAGNOSIS — E669 Obesity, unspecified: Secondary | ICD-10-CM | POA: Diagnosis not present

## 2018-05-20 DIAGNOSIS — G6181 Chronic inflammatory demyelinating polyneuritis: Secondary | ICD-10-CM | POA: Diagnosis not present

## 2018-05-21 ENCOUNTER — Encounter: Payer: Self-pay | Admitting: Neurology

## 2018-05-21 DIAGNOSIS — D631 Anemia in chronic kidney disease: Secondary | ICD-10-CM | POA: Diagnosis not present

## 2018-05-21 DIAGNOSIS — M00062 Staphylococcal arthritis, left knee: Secondary | ICD-10-CM | POA: Diagnosis not present

## 2018-05-21 DIAGNOSIS — E7800 Pure hypercholesterolemia, unspecified: Secondary | ICD-10-CM | POA: Diagnosis not present

## 2018-05-21 DIAGNOSIS — R0600 Dyspnea, unspecified: Secondary | ICD-10-CM | POA: Diagnosis not present

## 2018-05-21 DIAGNOSIS — E1122 Type 2 diabetes mellitus with diabetic chronic kidney disease: Secondary | ICD-10-CM | POA: Diagnosis not present

## 2018-05-21 DIAGNOSIS — B9561 Methicillin susceptible Staphylococcus aureus infection as the cause of diseases classified elsewhere: Secondary | ICD-10-CM | POA: Diagnosis not present

## 2018-05-21 DIAGNOSIS — D509 Iron deficiency anemia, unspecified: Secondary | ICD-10-CM | POA: Diagnosis not present

## 2018-05-21 DIAGNOSIS — I209 Angina pectoris, unspecified: Secondary | ICD-10-CM | POA: Diagnosis not present

## 2018-05-21 DIAGNOSIS — E669 Obesity, unspecified: Secondary | ICD-10-CM | POA: Diagnosis not present

## 2018-05-21 DIAGNOSIS — N189 Chronic kidney disease, unspecified: Secondary | ICD-10-CM | POA: Diagnosis not present

## 2018-05-21 DIAGNOSIS — I129 Hypertensive chronic kidney disease with stage 1 through stage 4 chronic kidney disease, or unspecified chronic kidney disease: Secondary | ICD-10-CM | POA: Diagnosis not present

## 2018-05-21 DIAGNOSIS — G6181 Chronic inflammatory demyelinating polyneuritis: Secondary | ICD-10-CM | POA: Diagnosis not present

## 2018-05-21 DIAGNOSIS — J42 Unspecified chronic bronchitis: Secondary | ICD-10-CM | POA: Diagnosis not present

## 2018-05-26 ENCOUNTER — Telehealth: Payer: Self-pay | Admitting: Gastroenterology

## 2018-05-26 DIAGNOSIS — R809 Proteinuria, unspecified: Secondary | ICD-10-CM | POA: Diagnosis not present

## 2018-05-26 DIAGNOSIS — E118 Type 2 diabetes mellitus with unspecified complications: Secondary | ICD-10-CM | POA: Diagnosis not present

## 2018-05-26 DIAGNOSIS — N181 Chronic kidney disease, stage 1: Secondary | ICD-10-CM | POA: Diagnosis not present

## 2018-05-26 DIAGNOSIS — I129 Hypertensive chronic kidney disease with stage 1 through stage 4 chronic kidney disease, or unspecified chronic kidney disease: Secondary | ICD-10-CM | POA: Diagnosis not present

## 2018-05-26 NOTE — Telephone Encounter (Signed)
Pt needs prior auth for med, see below.

## 2018-05-27 DIAGNOSIS — E669 Obesity, unspecified: Secondary | ICD-10-CM | POA: Diagnosis not present

## 2018-05-27 DIAGNOSIS — N189 Chronic kidney disease, unspecified: Secondary | ICD-10-CM | POA: Diagnosis not present

## 2018-05-27 DIAGNOSIS — B9561 Methicillin susceptible Staphylococcus aureus infection as the cause of diseases classified elsewhere: Secondary | ICD-10-CM | POA: Diagnosis not present

## 2018-05-27 DIAGNOSIS — E7800 Pure hypercholesterolemia, unspecified: Secondary | ICD-10-CM | POA: Diagnosis not present

## 2018-05-27 DIAGNOSIS — R0600 Dyspnea, unspecified: Secondary | ICD-10-CM | POA: Diagnosis not present

## 2018-05-27 DIAGNOSIS — M00062 Staphylococcal arthritis, left knee: Secondary | ICD-10-CM | POA: Diagnosis not present

## 2018-05-27 DIAGNOSIS — J42 Unspecified chronic bronchitis: Secondary | ICD-10-CM | POA: Diagnosis not present

## 2018-05-27 DIAGNOSIS — I209 Angina pectoris, unspecified: Secondary | ICD-10-CM | POA: Diagnosis not present

## 2018-05-27 DIAGNOSIS — D509 Iron deficiency anemia, unspecified: Secondary | ICD-10-CM | POA: Diagnosis not present

## 2018-05-27 DIAGNOSIS — I129 Hypertensive chronic kidney disease with stage 1 through stage 4 chronic kidney disease, or unspecified chronic kidney disease: Secondary | ICD-10-CM | POA: Diagnosis not present

## 2018-05-27 DIAGNOSIS — D631 Anemia in chronic kidney disease: Secondary | ICD-10-CM | POA: Diagnosis not present

## 2018-05-27 DIAGNOSIS — G6181 Chronic inflammatory demyelinating polyneuritis: Secondary | ICD-10-CM | POA: Diagnosis not present

## 2018-05-27 DIAGNOSIS — E1122 Type 2 diabetes mellitus with diabetic chronic kidney disease: Secondary | ICD-10-CM | POA: Diagnosis not present

## 2018-05-28 DIAGNOSIS — I209 Angina pectoris, unspecified: Secondary | ICD-10-CM | POA: Diagnosis not present

## 2018-05-28 DIAGNOSIS — D509 Iron deficiency anemia, unspecified: Secondary | ICD-10-CM | POA: Diagnosis not present

## 2018-05-28 DIAGNOSIS — B9561 Methicillin susceptible Staphylococcus aureus infection as the cause of diseases classified elsewhere: Secondary | ICD-10-CM | POA: Diagnosis not present

## 2018-05-28 DIAGNOSIS — N189 Chronic kidney disease, unspecified: Secondary | ICD-10-CM | POA: Diagnosis not present

## 2018-05-28 DIAGNOSIS — G6181 Chronic inflammatory demyelinating polyneuritis: Secondary | ICD-10-CM | POA: Diagnosis not present

## 2018-05-28 DIAGNOSIS — R0600 Dyspnea, unspecified: Secondary | ICD-10-CM | POA: Diagnosis not present

## 2018-05-28 DIAGNOSIS — E7800 Pure hypercholesterolemia, unspecified: Secondary | ICD-10-CM | POA: Diagnosis not present

## 2018-05-28 DIAGNOSIS — J42 Unspecified chronic bronchitis: Secondary | ICD-10-CM | POA: Diagnosis not present

## 2018-05-28 DIAGNOSIS — I129 Hypertensive chronic kidney disease with stage 1 through stage 4 chronic kidney disease, or unspecified chronic kidney disease: Secondary | ICD-10-CM | POA: Diagnosis not present

## 2018-05-28 DIAGNOSIS — E1122 Type 2 diabetes mellitus with diabetic chronic kidney disease: Secondary | ICD-10-CM | POA: Diagnosis not present

## 2018-05-28 DIAGNOSIS — E669 Obesity, unspecified: Secondary | ICD-10-CM | POA: Diagnosis not present

## 2018-05-28 DIAGNOSIS — D631 Anemia in chronic kidney disease: Secondary | ICD-10-CM | POA: Diagnosis not present

## 2018-05-28 DIAGNOSIS — M00062 Staphylococcal arthritis, left knee: Secondary | ICD-10-CM | POA: Diagnosis not present

## 2018-05-29 DIAGNOSIS — M00062 Staphylococcal arthritis, left knee: Secondary | ICD-10-CM | POA: Diagnosis not present

## 2018-05-29 DIAGNOSIS — B9561 Methicillin susceptible Staphylococcus aureus infection as the cause of diseases classified elsewhere: Secondary | ICD-10-CM | POA: Diagnosis not present

## 2018-05-29 DIAGNOSIS — E1122 Type 2 diabetes mellitus with diabetic chronic kidney disease: Secondary | ICD-10-CM | POA: Diagnosis not present

## 2018-05-29 DIAGNOSIS — J42 Unspecified chronic bronchitis: Secondary | ICD-10-CM | POA: Diagnosis not present

## 2018-05-29 DIAGNOSIS — E669 Obesity, unspecified: Secondary | ICD-10-CM | POA: Diagnosis not present

## 2018-05-29 DIAGNOSIS — G6181 Chronic inflammatory demyelinating polyneuritis: Secondary | ICD-10-CM | POA: Diagnosis not present

## 2018-05-29 DIAGNOSIS — I209 Angina pectoris, unspecified: Secondary | ICD-10-CM | POA: Diagnosis not present

## 2018-05-29 DIAGNOSIS — I129 Hypertensive chronic kidney disease with stage 1 through stage 4 chronic kidney disease, or unspecified chronic kidney disease: Secondary | ICD-10-CM | POA: Diagnosis not present

## 2018-05-29 DIAGNOSIS — D509 Iron deficiency anemia, unspecified: Secondary | ICD-10-CM | POA: Diagnosis not present

## 2018-05-29 DIAGNOSIS — N189 Chronic kidney disease, unspecified: Secondary | ICD-10-CM | POA: Diagnosis not present

## 2018-05-29 DIAGNOSIS — E7800 Pure hypercholesterolemia, unspecified: Secondary | ICD-10-CM | POA: Diagnosis not present

## 2018-05-29 DIAGNOSIS — D631 Anemia in chronic kidney disease: Secondary | ICD-10-CM | POA: Diagnosis not present

## 2018-05-29 DIAGNOSIS — R0600 Dyspnea, unspecified: Secondary | ICD-10-CM | POA: Diagnosis not present

## 2018-05-29 NOTE — Telephone Encounter (Signed)
Ted PA for medication.

## 2018-05-30 ENCOUNTER — Encounter: Payer: Self-pay | Admitting: Neurology

## 2018-05-30 ENCOUNTER — Ambulatory Visit (INDEPENDENT_AMBULATORY_CARE_PROVIDER_SITE_OTHER): Payer: BLUE CROSS/BLUE SHIELD | Admitting: Neurology

## 2018-05-30 VITALS — BP 100/70 | HR 102 | Ht 66.0 in | Wt 277.4 lb

## 2018-05-30 DIAGNOSIS — R296 Repeated falls: Secondary | ICD-10-CM | POA: Diagnosis not present

## 2018-05-30 DIAGNOSIS — R531 Weakness: Secondary | ICD-10-CM | POA: Diagnosis not present

## 2018-05-30 DIAGNOSIS — G6181 Chronic inflammatory demyelinating polyneuritis: Secondary | ICD-10-CM | POA: Diagnosis not present

## 2018-05-30 NOTE — Patient Instructions (Addendum)
Referral to occupational and physical therapy  Continue monthly IVIG  Recommend second opinion at an academic center  I will see you back for your wheelchair assessment  Return to clinic on September 30th at 11:00am

## 2018-05-30 NOTE — Progress Notes (Signed)
Follow-up Visit   Date: 05/30/18    THURMA PRIEGO MRN: [id number] DOB: January 05, 1966   Interim History: [name] is a 52 y.o. right-handed Caucasian female with insulin-dependent diabetes mellitus, thyroid cancer s/p resection secondary hypothyroidism (2016), hypertension, hyperlipidemia, GERD, s/p left L5 hemilaminectomy and discectomy, dysautonomia, chronic left ankle pain returning for a follow-up of chronic inflammatory demyelinating polyradiculoneuropathy.  History of present illness: In 2012, she underwent left L5 hemilaminectomy and discectomy by Dr. Arnoldo Morale for severe radicular left leg and foot pain with foot drop. Pain was significantly improved post-op and over the next few months, she was able to extend her toes and foot.   Starting in 2016, she had a spell of night sweats and burning sensation of the body.  She also has SOB and tachycardia and was later found to have thyroid cancer.  She also was treated with radioactive iodine later because her margins were not clean.  By June 2016, she was feeling very fatigued attributed to deconditioning so started walking, but then had left hip, knee, and foot pain. Ankle pain was deep and throbbing. She was recommended to immobilize the foot due to possible stress fracture.  She saw Dr. Doran Durand, orthopaedics, who recommended that she wear a cast.  She recalls it being better while wearing a cast, but never completely resolved. Once the cast was removed, she realized that she had weakness of the foot with eversion, inversion, and dorsiflexion. She failed PT, bracing, pain meds and immobilization and underwent left ankle surgery with ligament reconstruction and tendonlysis for instability and tendonitis in April 2017.  Unfortuantely, this did not improve her pain and she was referred to pain management for complex regional pain syndrome, however they did not feel that she had this.  She did physical therapy and was able to  improve left dorsiflexion some.  Because of ongoing deep, throbbing pain of the left ankle, she was referred for NCS/EMG which showed acute on chronic sensorimotor polyneuropathy affecting both legs, worse on the right.  She underwent CSF and serology testing which showed normal CSF protein.  Labs notable for vitamin B1 and B12 deficiency.   Around summer 2017, she started having new low back pain.  Pain is sharp and localized to her low back and radiates into her left buttocks, but does not radiate into her leg.  She saw Dr. Arnoldo Morale again and MRI lumbar spine which showed small L3-4 disc protrusion near the left L4 nerve root.  Post-op changes at left L5-S1 was stable.  Later in 2017, she also began having new burning and tingling sensation of the hands as well as left hip flexion weakness. She has not had any new falls and walks with a cane as needed.  She is getting fit for a left AFO.  She was evaluated by Dr. Caryl Comes for syncope and diagnosed with dysautonomia. She is compliant with abdominal binder.    She underwent left sural nerve biopsy by Dr. Arnoldo Morale in October 2018 which confirmed CIDP. She is having greater difficulty with reaching for objects overhead and keeping her arms raised.  She also complains of new issues with bladder incontinence and some bowel incontinence.  Sh continues to fall and have syncopal spells. Her weakness of the arms and legs are progressed, but tingling/burning pain is well controlled on gabapentin 634m TID.  UPDATE 02/21/2018:  She received her induction dose of IVIG in February and had severe headache with each dose, despite taking tylenol.  She has  suffered 3 falls since her last visit, all which have occurred when she was using a cane, not her walker.   She has noticed increased numbness of the right foot, as if her foot a "sponge" which worries her.  She has not appreciated any improvement or weakness since her last visit.   UPDATE 05/30/2018:  She is here for  follow-up visit.  She continues to get monthly IVIG, and does not appreciate any benefit.  She complains of headache and fatigue with her infusions.  There has been no change in her fine or gross motor movements of the hands and legs.  She had an occasion at home where she accidentally dropped a knife without knowing.  She continues to have falls and is being evaluated for a powerchair.   She complains of new problems with swallowing with liquids and solids.  She has previously seen Dr. Lyndel Safe for esophageal dilatation and is scheduled to see him next week.    She had one ER visit following a fall and injured her right knee and was found to have left knee septic arthritis.   Medications:  Current Outpatient Medications on File Prior to Visit  Medication Sig Dispense Refill  . acetaminophen (TYLENOL) 500 MG tablet Take 1,000 mg by mouth every 6 (six) hours as needed.    . ALPRAZolam (XANAX) 0.5 MG tablet Take 0.5 mg by mouth every 6 (six) hours as needed for anxiety or sleep.     . AMBULATORY NON FORMULARY MEDICATION 1 Units by Other route daily. Power wheelchair 1 Units 0  . ammonium lactate (LAC-HYDRIN) 12 % lotion Apply 1 application topically 3 (three) times daily as needed for dry skin.     Marland Kitchen aspirin EC 81 MG tablet Take 81 mg by mouth daily.    Marland Kitchen atorvastatin (LIPITOR) 20 MG tablet Take 20 mg by mouth daily at 6 PM.     . BAYER CONTOUR NEXT TEST test strip 1 each by Other route See admin instructions. Testing 3-6 times daily  4  . Biotin 10000 MCG TABS Take 10,000 mcg by mouth at bedtime.    . cetirizine (ZYRTEC) 10 MG tablet Take 10 mg by mouth daily as needed for allergies.    . clotrimazole-betamethasone (LOTRISONE) lotion Apply 1 application topically 2 (two) times daily as needed. For skin folds  2  . cyanocobalamin (,VITAMIN B-12,) 1000 MCG/ML injection Inject 1,000 mcg into the muscle every 30 (thirty) days.    . cyclobenzaprine (FLEXERIL) 10 MG tablet Take 10 mg by mouth 3 (three)  times daily as needed for muscle spasms (scheduled each night at bedtime).     . D3-50 50000 units capsule Take 50,000 Units by mouth every Saturday.  4  . Dermatological Products, Misc. (HYLATOPIC PLUS) CREA Apply 1 application topically 4 (four) times daily as needed (for hands irriation.).     Marland Kitchen dexlansoprazole (DEXILANT) 60 MG capsule Take 60 mg by mouth daily at 6 (six) AM.    . dicyclomine (BENTYL) 10 MG capsule Take 10 mg by mouth 2 (two) times daily.     Marland Kitchen doxycycline (PERIOSTAT) 20 MG tablet Take 20 mg by mouth 2 (two) times daily as needed. For rosacea.  3  . fluticasone (FLONASE) 50 MCG/ACT nasal spray Place 1 spray into the nose at bedtime. Reported on 12/27/2015    . furosemide (LASIX) 40 MG tablet Take 40 mg by mouth 2 (two) times daily.      Marland Kitchen gabapentin (NEURONTIN) 300 MG capsule  Take 2 capsules (600 mg total) by mouth 3 (three) times daily. (Patient taking differently: Take 600 mg by mouth 3 (three) times daily. (MAY HOLD AND TAKE 100 MG CAPSULES BASED ON SEDATION LEVEL)) 540 capsule 3  . halobetasol (ULTRAVATE) 0.05 % cream Apply 1 application topically 2 (two) times daily as needed (for finger blistering).     Marland Kitchen HYDROcodone-acetaminophen (NORCO) 5-325 MG tablet Take 1 tablet by mouth every 6 (six) hours as needed for moderate pain. 30 tablet 0  . HYDROcodone-acetaminophen (NORCO) 7.5-325 MG tablet Take 1-2 tablets by mouth every 6 (six) hours as needed for moderate pain (for pain.). 40 tablet 0  . hyoscyamine (LEVSIN SL) 0.125 MG SL tablet Take 1-2 tablets sublingually every 6 hours as needed. (Patient taking differently: Take 0.125 mg by mouth every 4 (four) hours as needed for cramping. Take 1-2 tablets sublingually every 4 hours as needed.) 120 tablet 2  . ibuprofen (ADVIL,MOTRIN) 200 MG tablet Take 600-800 mg by mouth every 8 (eight) hours as needed (for pain.).     . Insulin Human (INSULIN PUMP) SOLN Inject into the skin.    Marland Kitchen insulin regular human CONCENTRATED (HUMULIN R) 500  UNIT/ML injection Inject 2 Units into the skin See admin instructions. 2.0 units /hour 0500-2359, 1.95units 5409-8119 with carb ratio 1:50 for meal time bolus    . INVOKAMET 50-1000 MG TABS Take 1 tablet by mouth 2 (two) times daily.   5  . ketoconazole (NIZORAL) 2 % cream Apply 1 application topically 2 (two) times daily as needed. For tops of toes.    Marland Kitchen levothyroxine (SYNTHROID, LEVOTHROID) 137 MCG tablet Take 274 mcg by mouth daily before breakfast.    . lisinopril (PRINIVIL,ZESTRIL) 20 MG tablet Take 40 mg by mouth 2 (two) times daily.    . metoprolol succinate (TOPROL-XL) 100 MG 24 hr tablet Take 100 mg by mouth 2 (two) times daily. Take with or immediately following a meal.    . metroNIDAZOLE (METROCREAM) 0.75 % cream Apply 1 application topically 2 (two) times daily as needed (for rosacea.).     Marland Kitchen Multiple Vitamins-Minerals (ADULT ONE DAILY GUMMIES PO) Take 2 tablets by mouth at bedtime.    . mupirocin ointment (BACTROBAN) 2 % Apply 1 application topically 2 (two) times daily as needed. To left toe ulcer.    . ondansetron (ZOFRAN ODT) 4 MG disintegrating tablet Take 1 tablet (4 mg total) by mouth every 8 (eight) hours as needed. 20 tablet 0  . ondansetron (ZOFRAN-ODT) 8 MG disintegrating tablet Take 8 mg by mouth every 6 (six) hours as needed for nausea or vomiting.     Marland Kitchen PROCTOSOL HC 2.5 % rectal cream Apply 1 application topically daily as needed for hemorrhoids.   2  . triamcinolone ointment (KENALOG) 0.1 % Apply 1 application topically 2 (two) times daily as needed (for finger blister).      No current facility-administered medications on file prior to visit.     Allergies:  Allergies  Allergen Reactions  . Penicillins Anaphylaxis, Shortness Of Breath, Swelling and Palpitations    Has patient had a PCN reaction causing immediate rash, facial/tongue/throat swelling, SOB or lightheadedness with hypotension: Yes Has patient had a PCN reaction causing severe rash involving mucus  membranes or skin necrosis: No Has patient had a PCN reaction that required hospitalization No Has patient had a PCN reaction occurring within the last 10 years: No If all of the above answers are "NO", then may proceed with Cephalosporin use.   Marland Kitchen  Clarithromycin Nausea And Vomiting    Any antibiotic (doxyclycine, etc)  . Adhesive [Tape] Itching    Redness  . Latex Itching and Rash    Blisters with prolonged contact  . Moxifloxacin Nausea And Vomiting    Needs Zofran or Phenergan    Review of Systems:  CONSTITUTIONAL: No fevers, chills, night sweats, or weight loss.  EYES: No visual changes or eye pain ENT: No hearing changes.  No history of nose bleeds.   RESPIRATORY: No cough, wheezing and shortness of breath.   CARDIOVASCULAR: Negative for chest pain, and palpitations.   GI: +for abdominal discomfort, blood in stools or black stools.  No recent change in bowel habits.   GU:  +history of incontinence.   MUSCLOSKELETAL: No history of joint pain or swelling.  +myalgias.  +weakness SKIN: Negative for lesions, rash, and itching.   ENDOCRINE: Negative for cold or heat intolerance, polydipsia or goiter.   PSYCH:  No  depression +anxiety symptoms.   NEURO: As Above.   Vital Signs:  BP 100/70   Pulse (!) 102   Ht '5\' 6"'  (1.676 m)   Wt 277 lb 6 oz (125.8 kg)   SpO2 97%   BMI 44.77 kg/m   Neurological Exam: MENTAL STATUS including orientation to time, place, person, recent and remote memory, attention span and concentration, language, and fund of knowledge is normal.  Speech is not dysarthric.  She is very verbose and often difficult to redirect.   CRANIAL NERVES:  Pupils are round and reactive to light.  Extraocular muscles are intact.  Face is symmetric.  Palate elevates symmetrically.   MOTOR: Tone is normal.  There is some atrophy of the intrinsic hand muscles.   *there is some give-way weakness, making accurate assessment difficult Right Upper Extremity:    Left Upper  Extremity:    Deltoid  4+/5   Deltoid  4+/5   Biceps  4+/5   Biceps  4+/5   Triceps  5-/5   Triceps  5-/5   Wrist extensors  4+/5   Wrist extensors  4+/5   Wrist flexors  4-/5   Wrist flexors  4-/5   Finger extensors  4-/5   Finger extensors  4-/5   Finger flexors  4-/5   Finger flexors  4-/5   Dorsal interossei  3+/5   Dorsal interossei  3+/5   Abductor pollicis  4/5   Abductor pollicis  4/5   Tone (Ashworth scale)  0  Tone (Ashworth scale)  0   Right Lower Extremity:    Left Lower Extremity:    Hip flexors  4+/5   Hip flexors  4-/5   Hip extensors  5/5   Hip extensors  5/5   Knee flexors  5/5   Knee flexors  4/5   Knee extensors          5-/5 Inversion 5/5  inversion 2/5  Eversion 5/5  Eversion 2/5  Dorsiflexors  4/5   Dorsiflexors  4/5   Plantarflexors  4/5   Plantarflexors  4/5   Toe extensors  4-/5   Toe extensors  1/5   Toe flexors  4-/5   Toe flexors  1/5   Tone (Ashworth scale)  0  Tone (Ashworth scale)  0    MSRs:    Right  Left brachioradialis 2+  brachioradialis 2+  biceps 2+  biceps 2+  triceps 2+  triceps 2+  patellar 3+  patellar 3+  ankle jerk 0  ankle jerk 0  Hoffman no  Hoffman no  plantar response down  plantar response down   SENSORY:  Vibration 50% at the knees bilaterally, absent at the ankles; Temperature and pin prick is reduced over the hands and below the knees.   COORDINATION/GAIT:  Gait appears very unsteady with walker  Data: NCS/EMG of the arms 01/29/2017: The electrophysiologic testing is most consistent with an active on chronic, distal and symmetric sensorimotor polyneuropathy, demyelinating and axon loss in type. Overall, these findings are severe in degree electrically.   In addition, the presence of temporal dispersion suggests an acquired condition.  NCS/EMG of the legs 01/10/2017:    The electrophysiologic findings are most consistent with a  subacute sensorimotor polyneuropathy, axon loss in type, affecting the lower extremities and worse on the left. Overall, these findings are severe in degree electrically.  Labs 01/25/2017:  ESR 10, CRP 0.3, vitamin B12 256, ACE 5, MMA 231, ANA neg, SSA/B neg, copper 92, vitamin B1 6*, SPEP with IFE no M protein  CSF 02/19/2017:  R1200, W0 G76 P37  ACE 10, IgG index 0.47, Lyme neg, cytology neg, no OCB  MRI lumbar spine wwo contrast 02/28/2017: 1. Stable appearance of the lumbar spine. No acute abnormality identified. 2. Postoperative changes on the left L5-S1 without residual stenosis. 3. Small left subarticular disc protrusion at L3-4 encroaching upon the left lateral recess, and potentially affecting the descending left L4 nerve root. 4. Mild bilateral foraminal stenosis at L4-5 related to disc bulge and facet degeneration.  MRI cervical spine wwo contrast 09/09/2017: 1. Normal MRI appearance of the cervical spinal cord. No cord signal abnormality is identified. 2. Multilobulated disc protrusion at C4-5, flattening the right hemi cord without cord signal changes. 3. Broad posterior disc bulge at C6-7 with resultant mild canal and left C7 foraminal stenosis. 4. Central disc protrusion at C3-4 with resultant mild spinal stenosis. No significant cord deformity.  MRI brain wwo contrast 09/09/2017:  Normal MRI of the brain. No acute intracranial abnormality identified.  Left sural nerve biopsy performed on 10/22 shows chronic (predominant) and acute loss of large myelinated fibers with possible demyelination/remyelination process consistent with prior diagnosis of chronic inflammatory demyelinating poly-radicular neuropathy; marked thickening of the endoneutrial vessels consistent with diabetic neuropathy, clinical correlation recommended.   Left vastus lateralis muscle biopsy: 2 isolated foci of very mild perivascular, possibly vascular, inflammation, and mild nonspecific mild myofiber atrophy and  skeletal muscle example without myofiber necrosis.   IMPRESSION/PLAN: 1.  Chronic inflammatory polyradiculoneuropathy with distal weakness and sensory loss (worse on the left), dysautonomia.  This was ultimately diagnosed by sural nerve biopsy (09/2017); CSF testing was normal, however NCS/EMG was highly suggestive of a subacute polyneuropathy.  Her exam continues to show generalized weakness, worse distally and on the left side.  There is some give-way weakness which makes accurate testing difficult.  She started IVIG in February 2019 and has been continued on IVIG 1g/kg every 4 weeks.  I will look into options for [facility] given that she is not tolerating IVIG.  With her insurance, there was a lot of challenges getting IVIG approved and she was left with a substantial out-of-pocket cost; fortunately, she was able to quality for financial assistance. For her painful paresthesias, continue 672m TID.  Start out-patient PT and OT She will return to the  office for face-to-face mobility evaluation in the future She was offered a second opinion at an academic center for recommendations on escalation of therapy since she has been on steroids and IVIG with minimal improvement; she will consider this, only if there is no improvement going forward  2.  Chronic low back pain.  Continue flexeril 27m at bedtime.  Return to clinic in 3 months  Greater than 50% of this 45 minute visit was spent in counseling, explanation of diagnosis, planning of further management, and coordination of care due to her complex history and multiple complaints.    Thank you for allowing me to participate in patient's care.  If I can answer any additional questions, I would be pleased to do so.    Sincerely,    Adah Stoneberg K. PPosey Pronto DO

## 2018-06-02 DIAGNOSIS — E1122 Type 2 diabetes mellitus with diabetic chronic kidney disease: Secondary | ICD-10-CM | POA: Diagnosis not present

## 2018-06-02 DIAGNOSIS — R0600 Dyspnea, unspecified: Secondary | ICD-10-CM | POA: Diagnosis not present

## 2018-06-02 DIAGNOSIS — I129 Hypertensive chronic kidney disease with stage 1 through stage 4 chronic kidney disease, or unspecified chronic kidney disease: Secondary | ICD-10-CM | POA: Diagnosis not present

## 2018-06-02 DIAGNOSIS — N189 Chronic kidney disease, unspecified: Secondary | ICD-10-CM | POA: Diagnosis not present

## 2018-06-02 DIAGNOSIS — E7800 Pure hypercholesterolemia, unspecified: Secondary | ICD-10-CM | POA: Diagnosis not present

## 2018-06-02 DIAGNOSIS — I209 Angina pectoris, unspecified: Secondary | ICD-10-CM | POA: Diagnosis not present

## 2018-06-02 DIAGNOSIS — D509 Iron deficiency anemia, unspecified: Secondary | ICD-10-CM | POA: Diagnosis not present

## 2018-06-02 DIAGNOSIS — J42 Unspecified chronic bronchitis: Secondary | ICD-10-CM | POA: Diagnosis not present

## 2018-06-02 DIAGNOSIS — M00062 Staphylococcal arthritis, left knee: Secondary | ICD-10-CM | POA: Diagnosis not present

## 2018-06-02 DIAGNOSIS — D631 Anemia in chronic kidney disease: Secondary | ICD-10-CM | POA: Diagnosis not present

## 2018-06-02 DIAGNOSIS — B9561 Methicillin susceptible Staphylococcus aureus infection as the cause of diseases classified elsewhere: Secondary | ICD-10-CM | POA: Diagnosis not present

## 2018-06-02 DIAGNOSIS — G6181 Chronic inflammatory demyelinating polyneuritis: Secondary | ICD-10-CM | POA: Diagnosis not present

## 2018-06-02 DIAGNOSIS — E669 Obesity, unspecified: Secondary | ICD-10-CM | POA: Diagnosis not present

## 2018-06-03 ENCOUNTER — Telehealth: Payer: Self-pay | Admitting: Neurology

## 2018-06-03 ENCOUNTER — Other Ambulatory Visit: Payer: Self-pay | Admitting: *Deleted

## 2018-06-03 DIAGNOSIS — R29898 Other symptoms and signs involving the musculoskeletal system: Secondary | ICD-10-CM

## 2018-06-03 DIAGNOSIS — G8314 Monoplegia of lower limb affecting left nondominant side: Secondary | ICD-10-CM

## 2018-06-03 DIAGNOSIS — G6181 Chronic inflammatory demyelinating polyneuritis: Secondary | ICD-10-CM

## 2018-06-03 NOTE — Telephone Encounter (Signed)
PT and OT orders have been faxed.

## 2018-06-03 NOTE — Telephone Encounter (Signed)
Patient called and is needing to know if a referral was put in for OT as well as PT? Please Call. Thanks

## 2018-06-04 ENCOUNTER — Other Ambulatory Visit: Payer: Self-pay | Admitting: Neurology

## 2018-06-04 ENCOUNTER — Encounter: Payer: Self-pay | Admitting: Gastroenterology

## 2018-06-04 DIAGNOSIS — B9561 Methicillin susceptible Staphylococcus aureus infection as the cause of diseases classified elsewhere: Secondary | ICD-10-CM | POA: Diagnosis not present

## 2018-06-04 DIAGNOSIS — E7800 Pure hypercholesterolemia, unspecified: Secondary | ICD-10-CM | POA: Diagnosis not present

## 2018-06-04 DIAGNOSIS — N189 Chronic kidney disease, unspecified: Secondary | ICD-10-CM | POA: Diagnosis not present

## 2018-06-04 DIAGNOSIS — D631 Anemia in chronic kidney disease: Secondary | ICD-10-CM | POA: Diagnosis not present

## 2018-06-04 DIAGNOSIS — M00062 Staphylococcal arthritis, left knee: Secondary | ICD-10-CM | POA: Diagnosis not present

## 2018-06-04 DIAGNOSIS — I129 Hypertensive chronic kidney disease with stage 1 through stage 4 chronic kidney disease, or unspecified chronic kidney disease: Secondary | ICD-10-CM | POA: Diagnosis not present

## 2018-06-04 DIAGNOSIS — R0600 Dyspnea, unspecified: Secondary | ICD-10-CM | POA: Diagnosis not present

## 2018-06-04 DIAGNOSIS — D509 Iron deficiency anemia, unspecified: Secondary | ICD-10-CM | POA: Diagnosis not present

## 2018-06-04 DIAGNOSIS — E1122 Type 2 diabetes mellitus with diabetic chronic kidney disease: Secondary | ICD-10-CM | POA: Diagnosis not present

## 2018-06-04 DIAGNOSIS — J42 Unspecified chronic bronchitis: Secondary | ICD-10-CM | POA: Diagnosis not present

## 2018-06-04 DIAGNOSIS — E669 Obesity, unspecified: Secondary | ICD-10-CM | POA: Diagnosis not present

## 2018-06-04 DIAGNOSIS — G6181 Chronic inflammatory demyelinating polyneuritis: Secondary | ICD-10-CM | POA: Diagnosis not present

## 2018-06-04 DIAGNOSIS — I209 Angina pectoris, unspecified: Secondary | ICD-10-CM | POA: Diagnosis not present

## 2018-06-05 ENCOUNTER — Ambulatory Visit (INDEPENDENT_AMBULATORY_CARE_PROVIDER_SITE_OTHER): Payer: BLUE CROSS/BLUE SHIELD | Admitting: Gastroenterology

## 2018-06-05 ENCOUNTER — Encounter: Payer: Self-pay | Admitting: Gastroenterology

## 2018-06-05 VITALS — BP 118/80 | HR 88 | Ht 66.0 in | Wt 280.0 lb

## 2018-06-05 DIAGNOSIS — Z86010 Personal history of colonic polyps: Secondary | ICD-10-CM

## 2018-06-05 DIAGNOSIS — K760 Fatty (change of) liver, not elsewhere classified: Secondary | ICD-10-CM

## 2018-06-05 DIAGNOSIS — K589 Irritable bowel syndrome without diarrhea: Secondary | ICD-10-CM

## 2018-06-05 DIAGNOSIS — R1310 Dysphagia, unspecified: Secondary | ICD-10-CM | POA: Diagnosis not present

## 2018-06-05 MED ORDER — HYOSCYAMINE SULFATE 0.125 MG SL SUBL: 0.1250 mg | SUBLINGUAL_TABLET | Freq: Four times a day (QID) | SUBLINGUAL | 11 refills | Status: DC | PRN

## 2018-06-05 MED ORDER — SOD PICOSULFATE-MAG OX-CIT ACD 10-3.5-12 MG-GM -GM/160ML PO SOLN: 1.0000 | Freq: Once | ORAL | 0 refills | Status: AC

## 2018-06-05 MED ORDER — DICYCLOMINE HCL 10 MG PO CAPS: 10.0000 mg | ORAL_CAPSULE | Freq: Three times a day (TID) | ORAL | 0 refills | Status: DC

## 2018-06-05 MED ORDER — DEXLANSOPRAZOLE 60 MG PO CPDR: 60.0000 mg | DELAYED_RELEASE_CAPSULE | Freq: Every day | ORAL | 3 refills | Status: DC

## 2018-06-05 NOTE — Progress Notes (Addendum)
IMPRESSION and PLAN:    #1. H/O Colonic Polyps. S/P colonoscopy 01/2015, fair prep, tubular adenomas. -Proceed with colonoscopy after 2-day prep.  I discussed risks and benefits in detail with the patient.  She is fully aware.  This will be scheduled in coming days.     #2.  Esophageal Dysphagia. H/O esophageal stricture s/p EGD with dil 54Fr 07/2015 with some improvement. Prev Bx neg for EoE. With associated gastroesophageal reflux, failed multiple PPIs. - Ba swallow with barium tablet. -Thereafter, may need rpt EGD with esophageal dilatation.  We would like to get EGD performed at the same time as colonoscopy. -  Continue Dexilant 50m po qd.  #3. Fatty Liver.  Patient at risk for liver cirrhosis.  Has associated obesity, diabetes, hypercholesterolemia. [facility].  [facility]  Being followed by serial ultrasounds. Neg autoimmune hepatitis profile, AMA, ceruloplasmin, alpha-1 antitrypsin, p-ANCA, acute hepatitis profile 09/2017. -Proceed with [place]  Have recommended hepatic elastography. She would only get it done if it is approved by [facility]  At the present time they may consider it as experimental. -Encouraged patient to lose weight. -Monitor liver function tests.  #4. IBS with predominant diarrhea. Neg random colonic biopsies for microscopic colitis 2008. -Continue Levsin 0.125 mg sublingual 1 to 2 tablets every 6-8 hours as needed. -Continue Bentyl 10 mg p.o. 4 times daily as needed. -This regimen has helped her a lot.      HPI:    Chief Complaint:   Jenna MYVAINE JANKOWIAKis a 52y.o. female  Dx with CIDP, by Dr JArnoldo Moraleon IV IgG. Somewhat better. Still with occ dysphagia mostly to solids Heartburn is under good control with Dexilant.  She has tried multiple PPIs in the past. Has gained 6 pounds since the last visit. Denies having any significant nausea or vomiting. Diarrhea  is better with Levsin, Bentyl and dietary restrictions. Has chronic postprandial abdominal bloating and discomfort. Gets around on a walker Doing fairly well Creatinine is normal.  She is being followed by Dr. DEsau Grew   Past Medical History:  Diagnosis Date  . Anginal pain ([place]    chest pain  heart cath done  . Anxiety   . Cancer ([facility]    thyroid cancer  . Chronic bronchitis ([place]    "get it q yr"  . Chronic inflammatory demyelinating polyneuropathy ([place]   . Chronic kidney disease    Proteinuria  . Dyspnea    sob with exertion  . Early cataract    right  . Fibromyalgia   . GERD (gastroesophageal reflux disease)   . Heart murmur   . Hepatic adenoma   . High cholesterol   . History of gout   . History of hiatal hernia   . History of kidney stones   . Hypertension   . Hypothyroidism   . IBS (irritable bowel syndrome)   . Iron deficiency anemia    "comes and goes"  . Migraine    "@ least once/month" (02/18/2015)  . Obesity   . PONV (postoperative nausea and vomiting)    patch and anti nausea meds work well  . Proteinuria   . Spinal headache   . Syncope   . Tachycardia   . Type II diabetes mellitus ([place]   . Wears glasses     Current Outpatient Medications  Medication Sig Dispense Refill  . acetaminophen (TYLENOL) 500 MG tablet Take 1,000  mg by mouth every 6 (six) hours as needed.    . ALPRAZolam (XANAX) 0.5 MG tablet Take 0.5 mg by mouth every 6 (six) hours as needed for anxiety or sleep.     . AMBULATORY NON FORMULARY MEDICATION 1 Units by Other route daily. Power wheelchair 1 Units 0  . ammonium lactate (LAC-HYDRIN) 12 % lotion Apply 1 application topically 3 (three) times daily as needed for dry skin.     Marland Kitchen aspirin EC 81 MG tablet Take 81 mg by mouth daily.    Marland Kitchen atorvastatin (LIPITOR) 20 MG tablet Take 20 mg by mouth daily at 6 PM.     . BAYER CONTOUR NEXT TEST test strip 1 each by Other route See admin instructions. Testing 3-6 times daily  4  . Biotin  10000 MCG TABS Take 10,000 mcg by mouth at bedtime.    . cetirizine (ZYRTEC) 10 MG tablet Take 10 mg by mouth daily as needed for allergies.    . clotrimazole-betamethasone (LOTRISONE) lotion Apply 1 application topically 2 (two) times daily as needed. For skin folds  2  . cyanocobalamin (,VITAMIN B-12,) 1000 MCG/ML injection Inject 1,000 mcg into the muscle every 30 (thirty) days.    . cyclobenzaprine (FLEXERIL) 10 MG tablet Take 10 mg by mouth 3 (three) times daily as needed for muscle spasms (scheduled each night at bedtime).     . D3-50 50000 units capsule Take 50,000 Units by mouth every Saturday.  4  . Dermatological Products, Misc. (HYLATOPIC PLUS) CREA Apply 1 application topically 4 (four) times daily as needed (for hands irriation.).     Marland Kitchen dexlansoprazole (DEXILANT) 60 MG capsule Take 60 mg by mouth daily at 6 (six) AM.    . dicyclomine (BENTYL) 10 MG capsule Take 10 mg by mouth 2 (two) times daily.     Marland Kitchen doxycycline (PERIOSTAT) 20 MG tablet Take 20 mg by mouth 2 (two) times daily as needed. For rosacea.  3  . fluticasone (FLONASE) 50 MCG/ACT nasal spray Place 1 spray into the nose at bedtime. Reported on 12/27/2015    . furosemide (LASIX) 40 MG tablet Take 40 mg by mouth 2 (two) times daily.      Marland Kitchen gabapentin (NEURONTIN) 300 MG capsule TAKE 2 CAPSULES BY MOUTH THREE TIMES DAILY 540 capsule 3  . halobetasol (ULTRAVATE) 0.05 % cream Apply 1 application topically 2 (two) times daily as needed (for finger blistering).     Marland Kitchen HYDROcodone-acetaminophen (NORCO) 7.5-325 MG tablet Take 1-2 tablets by mouth every 6 (six) hours as needed for moderate pain (for pain.). 40 tablet 0  . hyoscyamine (LEVSIN SL) 0.125 MG SL tablet Take 1-2 tablets sublingually every 6 hours as needed. (Patient taking differently: Take 0.125 mg by mouth every 4 (four) hours as needed for cramping. Take 1-2 tablets sublingually every 4 hours as needed.) 120 tablet 2  . ibuprofen (ADVIL,MOTRIN) 200 MG tablet Take 600-800 mg by  mouth every 8 (eight) hours as needed (for pain.).     Marland Kitchen immune globulin, human, (GAMMAGARD S/D LESS IGA) 10 g injection Inject 60 g into the vein every 30 (thirty) days.    . Insulin Human (INSULIN PUMP) SOLN Inject into the skin.    Marland Kitchen insulin regular human CONCENTRATED (HUMULIN R) 500 UNIT/ML injection Inject 2 Units into the skin See admin instructions. 2.0 units /hour 0500-2359, 1.95units 0539-7673 with carb ratio 1:50 for meal time bolus    . INVOKAMET 50-1000 MG TABS Take 1 tablet by mouth 2 (two)  times daily.   5  . ketoconazole (NIZORAL) 2 % cream Apply 1 application topically 2 (two) times daily as needed. For tops of toes.    Marland Kitchen levothyroxine (SYNTHROID, LEVOTHROID) 137 MCG tablet Take 274 mcg by mouth daily before breakfast.    . lisinopril (PRINIVIL,ZESTRIL) 20 MG tablet Take 40 mg by mouth 2 (two) times daily.    . metoprolol succinate (TOPROL-XL) 100 MG 24 hr tablet Take 100 mg by mouth 2 (two) times daily. Take with or immediately following a meal.    . metroNIDAZOLE (METROCREAM) 0.75 % cream Apply 1 application topically 2 (two) times daily as needed (for rosacea.).     Marland Kitchen Multiple Vitamins-Minerals (ADULT ONE DAILY GUMMIES PO) Take 2 tablets by mouth at bedtime.    . mupirocin ointment (BACTROBAN) 2 % Apply 1 application topically 2 (two) times daily as needed. To left toe ulcer.    . ondansetron (ZOFRAN ODT) 4 MG disintegrating tablet Take 1 tablet (4 mg total) by mouth every 8 (eight) hours as needed. 20 tablet 0  . ondansetron (ZOFRAN-ODT) 8 MG disintegrating tablet Take 8 mg by mouth every 6 (six) hours as needed for nausea or vomiting.     Marland Kitchen PROCTOSOL HC 2.5 % rectal cream Apply 1 application topically daily as needed for hemorrhoids.   2  . triamcinolone ointment (KENALOG) 0.1 % Apply 1 application topically 2 (two) times daily as needed (for finger blister).      No current facility-administered medications for this visit.     Past Surgical History:  Procedure Laterality  Date  . ABDOMINAL HYSTERECTOMY  ~2012   "lap"  . ANKLE RECONSTRUCTION Left 03/29/2016   Procedure: LEFT ANKLE LATERAL LIGAMENT RECONSTRUCTION AND PERONEAL TENDON REPAIR OR TENOLYSIS;  Surgeon: Wylene Simmer, MD;  Location: [place];  Service: Orthopedics;  Laterality: Left;  . BACK SURGERY    . CARDIAC CATHETERIZATION  04/2009   "clean"  . COLONOSCOPY  01/12/2015   Colonic polyps status post polypectomy, small internal hemorrhoids. Otherwise normal. Bx: mild chronic gastritis.  . CYSTOSCOPY WITH URETEROSCOPY, STONE BASKETRY AND STENT PLACEMENT  ~ 2006  . ESOPHAGOGASTRODUODENOSCOPY  07/21/2015   Esophageal stricture status post esophageal dilatation. Mild gastritis.  Marland Kitchen insulin pump     U 500  . IRRIGATION AND DEBRIDEMENT KNEE Left 04/03/2018   Procedure: Arthroscopic versus open irrigation and debridement  left knee;  Surgeon: Nicholes Stairs, MD;  Location: [place];  Service: Orthopedics;  Laterality: Left;  . LAPAROSCOPIC CHOLECYSTECTOMY  05/2004  . LUMBAR DISC SURGERY  08/2011  . SURAL NERVE BX Left 09/30/2017   Procedure: LEFT SURAL NERVE AND MUSCLE BIOPSY;  Surgeon: Newman Pies, MD;  Location: [place];  Service: Neurosurgery;  Laterality: Left;  LEFT SURAL NERVE AND MUSCLE BIOPSY  . THYROIDECTOMY N/A 02/18/2015   Procedure: TOTAL THYROIDECTOMY;  Surgeon: [name], MD;  Location: [place];  Service: General;  Laterality: N/A;  . TOTAL THYROIDECTOMY  02/18/2015    Family History  Problem Relation Age of Onset  . Hypertension Mother   . Emphysema Mother   . AAA (abdominal aortic aneurysm) Mother   . Hypertension Father   . Coronary artery disease Father   . Cancer - Prostate Father   . Renal Disease Father   . Hypertension Brother     Social History   Tobacco Use  . Smoking status: Never Smoker  . Smokeless tobacco: Never Used  Substance Use Topics  . Alcohol use: No  . Drug use:  No    Allergies  Allergen Reactions  . Penicillins Anaphylaxis, Shortness Of Breath,  Swelling and Palpitations    Has patient had a PCN reaction causing immediate rash, facial/tongue/throat swelling, SOB or lightheadedness with hypotension: Yes Has patient had a PCN reaction causing severe rash involving mucus membranes or skin necrosis: No Has patient had a PCN reaction that required hospitalization No Has patient had a PCN reaction occurring within the last 10 years: No If all of the above answers are "NO", then may proceed with Cephalosporin use.   . Clarithromycin Nausea And Vomiting    Any antibiotic (doxyclycine, etc)  . Adhesive [Tape] Itching    Redness  . Latex Itching and Rash    Blisters with prolonged contact  . Moxifloxacin Nausea And Vomiting    Needs Zofran or Phenergan     Review of Systems: All systems reviewed and negative except where noted in HPI.    Physical Exam:     BP 118/80   Pulse 88   Ht _0  (1.676 m)   Wt 280 lb (127 kg)   BMI 45.19 kg/m   Filed Weights   06/05/18 0831  Weight: 280 lb (127 kg)    GENERAL:  Alert, oriented, cooperative, not in acute distress. PSYCH: :Pleasant, normal mood and affect. HEENT:  conjunctiva pink, mucous membranes moist, neck supple without masses. No jaundice. CARDIAC:  S1 S2 normal. No murmers. PULM: Normal respiratory effort, lungs CTA bilaterally, no wheezing. ABDOMEN: Inspection: No visible peristalsis, no abnormal pulsations, skin normal.  Palpation/percussion: Soft, nontender, nondistended, no rigidity, no abnormal dullness to percussion, no hepatosplenomegaly and no palpable abdominal masses.  Auscultation: Normal bowel sounds, no abdominal bruits. Rectal exam: Deferred SKIN:  turgor, no lesions seen. Musculoskeletal:  Normal muscle tone, normal strength. NEURO: Alert and oriented x 3, no focal neurologic deficits.   Data Reviewed: I have personally reviewed following labs and imaging studies  CBC Latest Ref Rng & Units 04/03/2018 09/26/2017 03/29/2016  WBC 4.0 - 10.5 K/uL 9.7 5.9 6.1    Hemoglobin 12.0 - 15.0 g/dL 11.7(L) 13.4 13.4  Hematocrit 36.0 - 46.0 % 36.3 41.8 40.5  Platelets 150 - 400 K/uL 435(H) 193 194   Hepatic Function Latest Ref Rng & Units 09/26/2017 07/01/2009  Total Protein 6.5 - 8.1 g/dL 6.9 6.0  Albumin 3.5 - 5.0 g/dL 4.1 3.2(L)  AST 15 - 41 U/L 105(H) 47(H)  ALT 14 - 54 U/L 71(H) 32  Alk Phosphatase 38 - 126 U/L 83 39  Total Bilirubin 0.3 - 1.2 mg/dL 0.5 1.1  Seen in presence of [employer] CMA.     Elmarie Devlin,MD 06/05/2018, 8:47 AM   CC Reynold Bowen, MD

## 2018-06-05 NOTE — Patient Instructions (Addendum)
If you are age 51 or older, your body mass index should be between 23-30. Your Body mass index is 45.19 kg/m. If this is out of the aforementioned range listed, please consider follow up with your Primary Care Provider.  If you are age 55 or younger, your body mass index should be between 19-25. Your Body mass index is 45.19 kg/m. If this is out of the aformentioned range listed, please consider follow up with your Primary Care Provider.   You have been scheduled for an abdominal ultrasound at [facility] Radiology (1st floor of hospital) on 06/23/18 at 10am. Please arrive 15 minutes prior to your appointment for registration. Make certain not to have anything to eat or drink 6 hours prior to your appointment. Should you need to reschedule your appointment, please contact radiology at [phone]. This test typically takes about 30 minutes to perform.  You have been scheduled for a Barium Esophogram at [facility]iology (1st floor of the hospital) on 06/23/18 at 11am. Please arrive 15 minutes prior to your appointment for registration. Make certain not to have anything to eat or drink 6 hours prior to your test. If you need to reschedule for any reason, please contact radiology at [phone] to do so. __________________________________________________________________ A barium swallow is an examination that concentrates on views of the esophagus. This tends to be a double contrast exam (barium and two liquids which, when combined, create a gas to distend the wall of the oesophagus) or single contrast (non-ionic iodine based). The study is usually tailored to your symptoms so a good history is essential. Attention is paid during the study to the form, structure and configuration of the esophagus, looking for functional disorders (such as aspiration, dysphagia, achalasia, motility and reflux) EXAMINATION You may be asked to change into a gown, depending on the type of swallow being performed. A  radiologist and radiographer will perform the procedure. The radiologist will advise you of the type of contrast selected for your procedure and direct you during the exam. You will be asked to stand, sit or lie in several different positions and to hold a small amount of fluid in your mouth before being asked to swallow while the imaging is performed .In some instances you may be asked to swallow barium coated marshmallows to assess the motility of a solid food bolus. The exam can be recorded as a digital or video fluoroscopy procedure. POST PROCEDURE It will take 1-2 days for the barium to pass through your system. To facilitate this, it is important, unless otherwise directed, to increase your fluids for the next 24-48hrs and to resume your normal diet.  This test typically takes about 30 minutes to perform.   __________________________________________________________________________________  [name] have been scheduled for an endoscopy and colonoscopy. Please follow the written instructions given to you at your visit today. Please pick up your prep supplies at the pharmacy within the next 1-3 days. If you use inhalers (even only as needed), please bring them with you on the day of your procedure. Your physician has requested that you go to www.startemmi.com and enter the access code given to you at your visit today. This web site gives a general overview about your procedure. However, you should still follow specific instructions given to you by our office regarding your preparation for the procedure.  We have sent the following medications to your pharmacy for you to pick up at your convenience: Dexilant 60 mg once daily.  Bentyl 10 mg four times daily.  Levsin 0.125 mg 2 tablets every 6-8 hours as needed.   Thank you,  Dr. Jackquline Denmark

## 2018-06-06 DIAGNOSIS — Z794 Long term (current) use of insulin: Secondary | ICD-10-CM | POA: Diagnosis not present

## 2018-06-06 DIAGNOSIS — Z4681 Encounter for fitting and adjustment of insulin pump: Secondary | ICD-10-CM | POA: Diagnosis not present

## 2018-06-06 DIAGNOSIS — E11621 Type 2 diabetes mellitus with foot ulcer: Secondary | ICD-10-CM | POA: Diagnosis not present

## 2018-06-06 DIAGNOSIS — E1129 Type 2 diabetes mellitus with other diabetic kidney complication: Secondary | ICD-10-CM | POA: Diagnosis not present

## 2018-06-13 ENCOUNTER — Telehealth: Payer: Self-pay | Admitting: Neurology

## 2018-06-13 DIAGNOSIS — E65 Localized adiposity: Secondary | ICD-10-CM | POA: Diagnosis not present

## 2018-06-13 DIAGNOSIS — R2233 Localized swelling, mass and lump, upper limb, bilateral: Secondary | ICD-10-CM | POA: Diagnosis not present

## 2018-06-13 DIAGNOSIS — E538 Deficiency of other specified B group vitamins: Secondary | ICD-10-CM

## 2018-06-13 DIAGNOSIS — E1140 Type 2 diabetes mellitus with diabetic neuropathy, unspecified: Secondary | ICD-10-CM | POA: Diagnosis not present

## 2018-06-13 NOTE — Telephone Encounter (Signed)
Patient aware. She will come Monday morning to have her lab drawn.

## 2018-06-13 NOTE — Telephone Encounter (Signed)
I can't find where this is prescribed from Dr. Posey Pronto, not in notes and in medication list it is given from "historical provider". Please advise if okay to send.

## 2018-06-13 NOTE — Telephone Encounter (Signed)
Patient state that the [facility] faxed [place] something buthas not heard back from [place]   *STAT* If patient is at the pharmacy, call can be transferred to refill team.  1.     Which medications need to be refilled? (please list name of each medication and dose if know) B-12 injection   2.     Which pharmacy/location (including street and city if local pharmacy) is medication to be sent to? [facility] in [place]  3.     Do they need a 30 or 90 day supply? 90 supply

## 2018-06-13 NOTE — Telephone Encounter (Signed)
Dr. Posey Pronto diagnosed B12 deficiency in Feb 2018, with B12 shots for a year, she is over a year now of getting the B12 injections. Would re-check B12 level and if still needed, then injections will be ordered. Thanks

## 2018-06-16 ENCOUNTER — Other Ambulatory Visit: Payer: BLUE CROSS/BLUE SHIELD

## 2018-06-17 ENCOUNTER — Telehealth: Payer: Self-pay | Admitting: Neurology

## 2018-06-17 ENCOUNTER — Other Ambulatory Visit: Payer: Self-pay | Admitting: Surgery

## 2018-06-17 DIAGNOSIS — G6181 Chronic inflammatory demyelinating polyneuritis: Secondary | ICD-10-CM | POA: Diagnosis not present

## 2018-06-17 DIAGNOSIS — R2233 Localized swelling, mass and lump, upper limb, bilateral: Secondary | ICD-10-CM

## 2018-06-17 NOTE — Telephone Encounter (Signed)
This will probably be a decision that needs made by Dr. Posey Pronto.   Dr. Posey Pronto - please advise.

## 2018-06-17 NOTE — Telephone Encounter (Signed)
[facility] in [place] calling to see if pt infusion can be changed from every 28 days to every 21 days. They will be faxing over a paper.

## 2018-06-19 ENCOUNTER — Encounter: Payer: Self-pay | Admitting: Gastroenterology

## 2018-06-20 ENCOUNTER — Ambulatory Visit
Admission: RE | Admit: 2018-06-20 | Discharge: 2018-06-20 | Disposition: A | Discharge status: Home / Self Care | Payer: BLUE CROSS/BLUE SHIELD | Source: Ambulatory Visit | Attending: Surgery | Admitting: Surgery

## 2018-06-20 ENCOUNTER — Other Ambulatory Visit: Payer: BLUE CROSS/BLUE SHIELD

## 2018-06-20 DIAGNOSIS — E538 Deficiency of other specified B group vitamins: Secondary | ICD-10-CM | POA: Diagnosis not present

## 2018-06-20 DIAGNOSIS — R2233 Localized swelling, mass and lump, upper limb, bilateral: Secondary | ICD-10-CM

## 2018-06-20 DIAGNOSIS — R928 Other abnormal and inconclusive findings on diagnostic imaging of breast: Secondary | ICD-10-CM | POA: Diagnosis not present

## 2018-06-20 DIAGNOSIS — N6310 Unspecified lump in the right breast, unspecified quadrant: Secondary | ICD-10-CM | POA: Diagnosis not present

## 2018-06-20 DIAGNOSIS — N6320 Unspecified lump in the left breast, unspecified quadrant: Secondary | ICD-10-CM | POA: Diagnosis not present

## 2018-06-21 LAB — VITAMIN B12: Vitamin B-12: 568 pg/mL (ref 200–1100)

## 2018-06-23 ENCOUNTER — Ambulatory Visit (HOSPITAL_COMMUNITY)
Admission: RE | Admit: 2018-06-23 | Discharge: 2018-06-23 | Disposition: A | Discharge status: Home / Self Care | Payer: BLUE CROSS/BLUE SHIELD | Source: Ambulatory Visit | Attending: Gastroenterology | Admitting: Gastroenterology

## 2018-06-23 DIAGNOSIS — K222 Esophageal obstruction: Secondary | ICD-10-CM | POA: Diagnosis not present

## 2018-06-23 DIAGNOSIS — R1310 Dysphagia, unspecified: Secondary | ICD-10-CM

## 2018-06-23 DIAGNOSIS — K760 Fatty (change of) liver, not elsewhere classified: Secondary | ICD-10-CM | POA: Insufficient documentation

## 2018-06-23 DIAGNOSIS — Z9049 Acquired absence of other specified parts of digestive tract: Secondary | ICD-10-CM | POA: Insufficient documentation

## 2018-06-24 ENCOUNTER — Encounter: Payer: Self-pay | Admitting: *Deleted

## 2018-06-24 ENCOUNTER — Telehealth: Payer: Self-pay | Admitting: *Deleted

## 2018-06-24 NOTE — Telephone Encounter (Signed)
Order faxed in last week.

## 2018-06-24 NOTE — Telephone Encounter (Signed)
Patient notified B12 normal via My Chart.

## 2018-06-24 NOTE — Telephone Encounter (Signed)
OK to change frequency of IVIG to every 21 days.

## 2018-06-24 NOTE — Telephone Encounter (Signed)
-----   Message from Pieter Partridge, DO sent at 06/23/2018  9:11 AM EDT ----- B12 is normal

## 2018-06-25 ENCOUNTER — Telehealth: Payer: Self-pay | Admitting: *Deleted

## 2018-06-25 ENCOUNTER — Encounter: Payer: Self-pay | Admitting: *Deleted

## 2018-06-25 NOTE — Telephone Encounter (Signed)
-----   Message from Alda Berthold, DO sent at 06/24/2018 11:57 AM EDT ----- OK to stop vitamin B12 injections and start daily OTC vitamin B12 1038mcg daily. Thanks.

## 2018-06-25 NOTE — Telephone Encounter (Signed)
Patient given results via MyChart     

## 2018-06-26 DIAGNOSIS — E1129 Type 2 diabetes mellitus with other diabetic kidney complication: Secondary | ICD-10-CM | POA: Diagnosis not present

## 2018-06-26 DIAGNOSIS — Z4681 Encounter for fitting and adjustment of insulin pump: Secondary | ICD-10-CM | POA: Diagnosis not present

## 2018-06-26 DIAGNOSIS — E11621 Type 2 diabetes mellitus with foot ulcer: Secondary | ICD-10-CM | POA: Diagnosis not present

## 2018-06-26 DIAGNOSIS — Z794 Long term (current) use of insulin: Secondary | ICD-10-CM | POA: Diagnosis not present

## 2018-06-30 ENCOUNTER — Encounter: Payer: Self-pay | Admitting: Neurology

## 2018-07-02 ENCOUNTER — Telehealth: Payer: Self-pay | Admitting: Gastroenterology

## 2018-07-02 NOTE — Telephone Encounter (Signed)
Patient is on an insulin pump.  I did call her PCP, Dr Forde Dandy and was given the direction that the patient should turn her pump on "basal" for the procedure.  I then spoke with the patient and she verbalized understanding and compliance with that order.

## 2018-07-03 ENCOUNTER — Encounter: Payer: Self-pay | Admitting: Gastroenterology

## 2018-07-03 ENCOUNTER — Telehealth: Payer: Self-pay

## 2018-07-03 ENCOUNTER — Ambulatory Visit (AMBULATORY_SURGERY_CENTER): Payer: BLUE CROSS/BLUE SHIELD | Admitting: Gastroenterology

## 2018-07-03 VITALS — BP 114/64 | HR 107 | Temp 98.2°F | Resp 16 | Ht 66.0 in | Wt 280.0 lb

## 2018-07-03 DIAGNOSIS — K222 Esophageal obstruction: Secondary | ICD-10-CM

## 2018-07-03 DIAGNOSIS — R197 Diarrhea, unspecified: Secondary | ICD-10-CM

## 2018-07-03 DIAGNOSIS — Z86010 Personal history of colonic polyps: Secondary | ICD-10-CM

## 2018-07-03 DIAGNOSIS — D129 Benign neoplasm of anus and anal canal: Secondary | ICD-10-CM

## 2018-07-03 DIAGNOSIS — K2950 Unspecified chronic gastritis without bleeding: Secondary | ICD-10-CM | POA: Diagnosis not present

## 2018-07-03 DIAGNOSIS — K229 Disease of esophagus, unspecified: Secondary | ICD-10-CM

## 2018-07-03 DIAGNOSIS — D128 Benign neoplasm of rectum: Secondary | ICD-10-CM

## 2018-07-03 DIAGNOSIS — D124 Benign neoplasm of descending colon: Secondary | ICD-10-CM | POA: Diagnosis not present

## 2018-07-03 DIAGNOSIS — R1310 Dysphagia, unspecified: Secondary | ICD-10-CM

## 2018-07-03 DIAGNOSIS — K2900 Acute gastritis without bleeding: Secondary | ICD-10-CM

## 2018-07-03 DIAGNOSIS — K621 Rectal polyp: Secondary | ICD-10-CM | POA: Diagnosis not present

## 2018-07-03 MED ORDER — SODIUM CHLORIDE 0.9 % IV SOLN: 500.0000 mL | Freq: Once | INTRAVENOUS | Status: DC

## 2018-07-03 MED ORDER — DEXLANSOPRAZOLE 60 MG PO CPDR: 60.0000 mg | DELAYED_RELEASE_CAPSULE | Freq: Every day | ORAL | 3 refills | Status: DC

## 2018-07-03 NOTE — Patient Instructions (Signed)
Thank you for allowing [place] to care for you today!  Await pathology results by mail, approximately 2-3 weeks.  Continue present medications, Dexilant 60 mg by mouth once per day.  Follow up in GI clinic in 12 weeks.  Post-dilation diet today.  Handout provided.    YOU HAD AN ENDOSCOPIC PROCEDURE TODAY AT [facility]:   Refer to the procedure report that was given to you for any specific questions about what was found during the examination.  If the procedure report does not answer your questions, please call your gastroenterologist to clarify.  If you requested that your care partner not be given the details of your procedure findings, then the procedure report has been included in a sealed envelope for you to review at your convenience later.  YOU SHOULD EXPECT: Some feelings of bloating in the abdomen. Passage of more gas than usual.  Walking can help get rid of the air that was put into your GI tract during the procedure and reduce the bloating. If you had a lower endoscopy (such as a colonoscopy or flexible sigmoidoscopy) you may notice spotting of blood in your stool or on the toilet paper. If you underwent a bowel prep for your procedure, you may not have a normal bowel movement for a few days.  Please Note:  You might notice some irritation and congestion in your nose or some drainage.  This is from the oxygen used during your procedure.  There is no need for concern and it should clear up in a day or so.  SYMPTOMS TO REPORT IMMEDIATELY:   Following lower endoscopy (colonoscopy or flexible sigmoidoscopy):  Excessive amounts of blood in the stool  Significant tenderness or worsening of abdominal pains  Swelling of the abdomen that is new, acute  Fever of 100F or higher   Following upper endoscopy (EGD)  Vomiting of blood or coffee ground material  New chest pain or pain under the shoulder blades  Painful or persistently difficult swallowing  New shortness of  breath  Fever of 100F or higher  Black, tarry-looking stools  For urgent or emergent issues, a gastroenterologist can be reached at any hour by calling [phone].   DIET:  We do recommend a small meal at first, but then you may proceed to your regular diet.  Drink plenty of fluids but you should avoid alcoholic beverages for 24 hours.  ACTIVITY:  You should plan to take it easy for the rest of today and you should NOT DRIVE or use heavy machinery until tomorrow (because of the sedation medicines used during the test).    FOLLOW UP: Our staff will call the number listed on your records the next business day following your procedure to check on you and address any questions or concerns that you may have regarding the information given to you following your procedure. If we do not reach you, we will leave a message.  However, if you are feeling well and you are not experiencing any problems, there is no need to return our call.  We will assume that you have returned to your regular daily activities without incident.  If any biopsies were taken you will be contacted by phone or by letter within the next 1-3 weeks.  Please call us at [phone] if you have not heard about the biopsies in 3 weeks.    SIGNATURES/CONFIDENTIALITY: You and/or your care partner have signed paperwork which will be entered into your electronic medical record.  These signatures attest to the fact that that the information above on your After Visit Summary has been reviewed and is understood.  Full responsibility of the confidentiality of this discharge information lies with you and/or your care-partner.

## 2018-07-03 NOTE — Telephone Encounter (Signed)
Dr. Lyndel Safe calling to speak to DOD regarding Dr. Caryl Comes patient.

## 2018-07-03 NOTE — Progress Notes (Addendum)
    Pt has a rolling wheelchair.  Pt has an infusing insulin pump.  Lafe Garin, CRNA and Recovery nurse were notified. maw

## 2018-07-03 NOTE — Telephone Encounter (Signed)
Dr Irish Lack, Thanks for your help this morning. Iantha did really well Had EGD with dilation and colonoscopy Thanks again

## 2018-07-03 NOTE — Op Note (Signed)
[facility] Patient Name: [name] Procedure Date: 07/03/2018 9:07 AM MRN: [id number] Endoscopist: Jackquline Denmark , MD Age: 51 Referring MD:  Date of Birth: 06-05-1966 Gender: Female Account #: [id number] Procedure:                Upper GI endoscopy Indications:              Dysphagia with abnormal barium swallow. Medicines:                Monitored Anesthesia Care Procedure:                Pre-Anesthesia Assessment:                           - Prior to the procedure, a History and Physical                            was performed, and patient medications and                            allergies were reviewed. The patient is competent.                            The risks and benefits of the procedure and the                            sedation options and risks were discussed with the                            patient. All questions were answered and informed                            consent was obtained. Patient identification and                            proposed procedure were verified by the physician                            in the procedure room. Mental Status Examination:                            alert and oriented. Prophylactic Antibiotics: The                            patient does not require prophylactic antibiotics.                            Prior Anticoagulants: The patient has taken no                            previous anticoagulant or antiplatelet agents. ASA                            Grade Assessment: III - A patient with severe  systemic disease. After reviewing the risks and                            benefits, the patient was deemed in satisfactory                            condition to undergo the procedure. The anesthesia                            plan was to use monitored anesthesia care (MAC).                            Immediately prior to administration of medications,   the patient was re-assessed for adequacy to receive                            sedatives. The heart rate, respiratory rate, oxygen                            saturations, blood pressure, adequacy of pulmonary                            ventilation, and response to care were monitored                            throughout the procedure. The physical status of                            the patient was re-assessed after the procedure.                           After obtaining informed consent, the endoscope was                            passed under direct vision. Throughout the                            procedure, the patient's blood pressure, pulse, and                            oxygen saturations were monitored continuously. The                            Endoscope was introduced through the mouth, and                            advanced to the second part of duodenum. The upper                            GI endoscopy was accomplished without difficulty.                            The patient tolerated the procedure well. Scope In: Scope Out: Findings:  One benign-appearing, intrinsic moderate                            (circumferential scarring or stenosis; an endoscope                            may pass) stenosis was found 36 cm from the                            incisors. This stenosis measured 1.2 cm (inner                            diameter). The stenosis was traversed. A TTS                            dilator was passed through the scope. Dilation with                            a 16-17-18 mm balloon dilator was performed to 18                            mm. The dilation site was examined and showed                            complete resolution of luminal narrowing. Biopsies                            were taken with a cold forceps for histology.                            Estimated blood loss was minimal. (Proximal and                            mid-esophageal  biopsies were not performed since                            previous biopsies were negative for eosinophilic                            esophagitis). Few whitish lesions were noted in the                            mid and distal esophagus ? importance (biopsied to                            rule out Candida).                           Localized mild inflammation characterized by                            erythema was found in the gastric antrum. Biopsies  were taken with a cold forceps for histology.                           The exam was otherwise without abnormality. Complications:            No immediate complications. Estimated Blood Loss:     Estimated blood loss was minimal. Impression:               - Distal esophageal stricture status post                            esophageal dilatation.                           - Mild gastritis. Recommendation:           - Patient has a contact number available for                            emergencies. The signs and symptoms of potential                            delayed complications were discussed with the                            patient. Return to normal activities tomorrow.                            Written discharge instructions were provided to the                            patient.                           - Post dilation diet today. Resume previous diet                            from tomorrow onwards.                           - Continue present medications. Continue Dexilant                            60 mg by mouth once a day                           - Await pathology results.                           - Return to GI clinic in 12 weeks. Jackquline Denmark, MD 07/03/2018 9:55:38 AM This report has been signed electronically.

## 2018-07-03 NOTE — Op Note (Signed)
[facility] Patient Name: [name] Procedure Date: 07/03/2018 9:07 AM MRN: [id number] Endoscopist: Jackquline Denmark , MD Age: 52 Referring MD:  Date of Birth: Jan 25, 1966 Gender: Female Account #: [id number] Procedure:                Colonoscopy Indications:              High risk colon cancer surveillance: Personal                            history of colonic polyps, Incidental - Clinically                            significant diarrhea of unexplained origin Medicines:                Monitored Anesthesia Care Procedure:                Pre-Anesthesia Assessment:                           - Prior to the procedure, a History and Physical                            was performed, and patient medications and                            allergies were reviewed. The patient is competent.                            The risks and benefits of the procedure and the                            sedation options and risks were discussed with the                            patient. All questions were answered and informed                            consent was obtained. Patient identification and                            proposed procedure were verified by the physician                            in the procedure room. Mental Status Examination:                            alert and oriented. Prophylactic Antibiotics: The                            patient does not require prophylactic antibiotics.                            Prior Anticoagulants: The patient has taken no  previous anticoagulant or antiplatelet agents. ASA                            Grade Assessment: III - A patient with severe                            systemic disease. After reviewing the risks and                            benefits, the patient was deemed in satisfactory                            condition to undergo the procedure. The anesthesia                            plan was  to use monitored anesthesia care (MAC).                            Immediately prior to administration of medications,                            the patient was re-assessed for adequacy to receive                            sedatives. The heart rate, respiratory rate, oxygen                            saturations, blood pressure, adequacy of pulmonary                            ventilation, and response to care were monitored                            throughout the procedure. The physical status of                            the patient was re-assessed after the procedure.                           After obtaining informed consent, the colonoscope                            was passed under direct vision. Throughout the                            procedure, the patient's blood pressure, pulse, and                            oxygen saturations were monitored continuously. The                            Colonoscope was introduced through the anus and  advanced to the the cecum, identified by                            appendiceal orifice and ileocecal valve. The                            colonoscopy was somewhat difficult due to poor                            bowel prep and a tortuous colon. Successful                            completion of the procedure was aided by applying                            abdominal pressure and lavage. The patient                            tolerated the procedure well. The quality of the                            bowel preparation was fair. There was some retained                            stool and preparation throughout the colon.                            Photodocumentation was obtained. Aggressive                            suctioning and aspiration was performed. Despite                            this aggressive suctioning and aspiration, 85-90%                            of the colonic mucosa was visualized                             satisfactorily. Small and flat lesions could have                            been missed. Scope In: 9:27:20 AM Scope Out: 9:45:58 AM Total Procedure Duration: 0 hours 18 minutes 38 seconds  Findings:                 A 6 mm polyp was found in the mid descending colon.                            The polyp was sessile. The polyp was removed with a                            cold snare. Resection and retrieval were complete.  Estimated blood loss: none.                           A 6 mm polyp was found in the rectum. The polyp was                            sessile. The polyp was removed with a cold snare.                            Resection and retrieval were complete. Estimated                            blood loss: none. Biopsies for histology were taken                            with a cold forceps from the entire colon for                            evaluation of microscopic colitis.                           Non-bleeding internal hemorrhoids were found during                            retroflexion. The hemorrhoids were moderate.                           The exam was otherwise without abnormality. Complications:            No immediate complications. Estimated Blood Loss:     Estimated blood loss: none. Impression:               - Colonic polyps status post polypectomy.                           - Non-bleeding internal hemorrhoids.                           - The examination was otherwise grossly normal. Recommendation:           - Patient has a contact number available for                            emergencies. The signs and symptoms of potential                            delayed complications were discussed with the                            patient. Return to normal activities tomorrow.                            Written discharge instructions were provided to the                            patient.                           -  Resume  previous diet.                           - Continue present medications.                           - Await pathology results.                           - Repeat colonoscopy for surveillance of multiple                            polyps.                           - Return to GI clinic in 12 weeks. Jackquline Denmark, MD 07/03/2018 10:01:15 AM This report has been signed electronically.

## 2018-07-03 NOTE — Progress Notes (Signed)
Called to room to assist during endoscopic procedure.  Patient ID and intended procedure confirmed with present staff. Received instructions for my participation in the procedure from the performing physician.  

## 2018-07-03 NOTE — Progress Notes (Signed)
Report given to PACU, vss 

## 2018-07-03 NOTE — Telephone Encounter (Signed)
PAtient at the facility for EGD.  Called because she reported some chronic intermittent chest pain over the last year.  Last episode was 2 weeks ago.  THey were unable to see results of the prior cardiac w/u.  I reviewed the monitor and echo results from 2018.  It appears the chest pain was documented as far back as 2018.  THey will likely proceed with  EGD.  No significant ischemic cardiac issues detected in the past by Dr. Caryl Comes.

## 2018-07-08 ENCOUNTER — Telehealth: Payer: Self-pay | Admitting: *Deleted

## 2018-07-08 NOTE — Telephone Encounter (Signed)
  Follow up Call-  Call back number 07/03/2018  Post procedure Call Back phone  # [phone] hm  Permission to leave phone message Yes  Some recent data might be hidden     Patient questions:  Do you have a fever, pain , or abdominal swelling? No. Pain Score  0 *  Have you tolerated food without any problems? Yes.    Have you been able to return to your normal activities? Yes.    Do you have any questions about your discharge instructions: Diet   No. Medications  No. Follow up visit  No.  Do you have questions or concerns about your Care? No.  Actions: * If pain score is 4 or above: No action needed, pain <4.

## 2018-07-09 ENCOUNTER — Other Ambulatory Visit: Payer: Self-pay | Admitting: Neurology

## 2018-07-09 ENCOUNTER — Other Ambulatory Visit: Payer: Self-pay | Admitting: Gastroenterology

## 2018-07-09 DIAGNOSIS — G6181 Chronic inflammatory demyelinating polyneuritis: Secondary | ICD-10-CM | POA: Diagnosis not present

## 2018-07-09 MED ORDER — GABAPENTIN 100 MG PO CAPS: 100.0000 mg | ORAL_CAPSULE | Freq: Three times a day (TID) | ORAL | 2 refills | Status: DC

## 2018-07-09 NOTE — Addendum Note (Signed)
Addended by: Alda Berthold on: 07/09/2018 12:38 PM   Modules accepted: Orders

## 2018-07-10 ENCOUNTER — Encounter: Payer: Self-pay | Admitting: Gastroenterology

## 2018-07-10 DIAGNOSIS — G6181 Chronic inflammatory demyelinating polyneuritis: Secondary | ICD-10-CM | POA: Diagnosis not present

## 2018-07-15 ENCOUNTER — Encounter: Payer: Self-pay | Admitting: Neurology

## 2018-07-17 ENCOUNTER — Encounter: Payer: Self-pay | Admitting: Neurology

## 2018-07-17 ENCOUNTER — Ambulatory Visit (INDEPENDENT_AMBULATORY_CARE_PROVIDER_SITE_OTHER): Payer: BLUE CROSS/BLUE SHIELD | Admitting: Neurology

## 2018-07-17 VITALS — BP 120/80 | HR 90 | Ht 66.0 in | Wt 270.5 lb

## 2018-07-17 DIAGNOSIS — Z741 Need for assistance with personal care: Secondary | ICD-10-CM

## 2018-07-17 DIAGNOSIS — Z7409 Other reduced mobility: Secondary | ICD-10-CM

## 2018-07-17 DIAGNOSIS — G6181 Chronic inflammatory demyelinating polyneuritis: Secondary | ICD-10-CM | POA: Diagnosis not present

## 2018-07-17 NOTE — Progress Notes (Signed)
Follow-up Visit   Date: 07/17/18    [name] MRN: [id number] DOB: 07-Jun-1966   Interim History: [name] is a 52 y.o. right-handed Caucasian female with insulin-dependent diabetes mellitus, thyroid cancer s/p resection secondary hypothyroidism (2016), hypertension, hyperlipidemia, GERD, s/p left L5 hemilaminectomy and discectomy, dysautonomia, and chronic inflammatory demyelinating polyradiculoneuropathy here for face-to-face evaluation for power wheelchair assessment.  She was diagnosed with CIDP in the fall of 2018 after left sural nerve biopsy showed demyelinating and axon loss findings.  Several years preceding this, she was having painful paresthesias and diffuse weakness of the feet which extended in the legs and arms.  She also developed dysautonomic symptoms of bladder incontinence, orthostatic hypotension, and syncope. She was also frequently falling and started using a rollator.  She was started on IVIG in February 2019 and has been experiencing problems with IV venous access and headaches, despite pretreating with tylenol.  She also has excessive fatigue and gets weaker by week 2 following her IVIG infusion. She is currently getting IVIG every 3 weeks.    She is here today for face-to-face visit for power wheelchair assessment.  She has progressive weakness of the legs and arms due to CIDP.  She is currently using a rollator however this is being increasingly difficult to operate due to hand weakness and is not safe due to ongoing falls even when using this.  She is unable to operate a manual wheelchair due to arm weakness prohibiting her to push herself.    She needs tilt and recline feature due to inability to pressure relieve effectively.  She is able to operate power wheel chair independently, however, at times may need her husband to use attendant control to assist, such as when fatigued or lightheaded.  Seat elevator would benefit to allow her to  reach various levels in her home to perform activities of daily living, such as cooking, as well as transfers onto her bed or commode.    Medications:  Current Outpatient Medications on File Prior to Visit  Medication Sig Dispense Refill  . acetaminophen (TYLENOL) 500 MG tablet Take 1,000 mg by mouth every 6 (six) hours as needed.    . ALPRAZolam (XANAX) 0.5 MG tablet Take 0.5 mg by mouth every 6 (six) hours as needed for anxiety or sleep.     . AMBULATORY NON FORMULARY MEDICATION 1 Units by Other route daily. Power wheelchair 1 Units 0  . ammonium lactate (LAC-HYDRIN) 12 % lotion Apply 1 application topically 3 (three) times daily as needed for dry skin.     Marland Kitchen aspirin EC 81 MG tablet Take 81 mg by mouth daily.    Marland Kitchen atorvastatin (LIPITOR) 20 MG tablet Take 20 mg by mouth daily at 6 PM.     . BAYER CONTOUR NEXT TEST test strip 1 each by Other route See admin instructions. Testing 3-6 times daily  4  . Biotin 10000 MCG TABS Take 10,000 mcg by mouth at bedtime.    . Canagliflozin-metFORMIN HCl (INVOKAMET) 50-1000 MG TABS Take 50-1,000 mg by mouth 2 (two) times daily.    . cetirizine (ZYRTEC) 10 MG tablet Take 10 mg by mouth daily as needed for allergies.    . clotrimazole-betamethasone (LOTRISONE) lotion Apply 1 application topically 2 (two) times daily as needed. For skin folds  2  . cyanocobalamin (,VITAMIN B-12,) 1000 MCG/ML injection Inject 1,000 mcg into the muscle every 30 (thirty) days.    . cyclobenzaprine (FLEXERIL) 10 MG tablet Take 10 mg  by mouth 3 (three) times daily as needed for muscle spasms (scheduled each night at bedtime).     . D3-50 50000 units capsule Take 50,000 Units by mouth every Saturday.  4  . Dermatological Products, Misc. (HYLATOPIC PLUS) CREA Apply 1 application topically 4 (four) times daily as needed (for hands irriation.).     Marland Kitchen dexlansoprazole (DEXILANT) 60 MG capsule Take 60 mg by mouth daily at 6 (six) AM.    . dexlansoprazole (DEXILANT) 60 MG capsule Take 1  capsule (60 mg total) by mouth daily. 90 capsule 3  . dicyclomine (BENTYL) 10 MG capsule Take 10 mg by mouth 2 (two) times daily.     Marland Kitchen dicyclomine (BENTYL) 10 MG capsule TAKE 1 CAPSULE BY MOUTH 4 TIMES DAILY BEFORE MEAL(S) AND AT BEDTIME 90 capsule 0  . doxycycline (PERIOSTAT) 20 MG tablet Take 20 mg by mouth 2 (two) times daily as needed. For rosacea.  3  . fluticasone (FLONASE) 50 MCG/ACT nasal spray Place 1 spray into the nose at bedtime. Reported on 12/27/2015    . furosemide (LASIX) 40 MG tablet Take 40 mg by mouth 2 (two) times daily.      Marland Kitchen gabapentin (NEURONTIN) 100 MG capsule Take 1 capsule (100 mg total) by mouth 3 (three) times daily. 360 capsule 2  . gabapentin (NEURONTIN) 300 MG capsule TAKE 2 CAPSULES BY MOUTH THREE TIMES DAILY 540 capsule 3  . halobetasol (ULTRAVATE) 0.05 % cream Apply 1 application topically 2 (two) times daily as needed (for finger blistering).     Marland Kitchen HYDROcodone-acetaminophen (NORCO) 7.5-325 MG tablet Take 1-2 tablets by mouth every 6 (six) hours as needed for moderate pain (for pain.). 40 tablet 0  . hyoscyamine (LEVSIN SL) 0.125 MG SL tablet Place 1 tablet (0.125 mg total) under the tongue every 6 (six) hours as needed. 120 tablet 11  . ibuprofen (ADVIL,MOTRIN) 200 MG tablet Take 600-800 mg by mouth every 8 (eight) hours as needed (for pain.).     Marland Kitchen immune globulin, human, (GAMMAGARD S/D LESS IGA) 10 g injection Inject 60 g into the vein every 30 (thirty) days.    . Insulin Human (INSULIN PUMP) SOLN Inject into the skin.    Marland Kitchen insulin regular human CONCENTRATED (HUMULIN R) 500 UNIT/ML injection Inject 2 Units into the skin See admin instructions. 2.0 units /hour 0500-2359, 1.95units 6789-3810 with carb ratio 1:50 for meal time bolus    . INVOKAMET 50-1000 MG TABS Take 1 tablet by mouth 2 (two) times daily.   5  . ketoconazole (NIZORAL) 2 % cream Apply 1 application topically 2 (two) times daily as needed. For tops of toes.    Marland Kitchen levothyroxine (SYNTHROID, LEVOTHROID)  137 MCG tablet Take 274 mcg by mouth daily before breakfast.    . lisinopril (PRINIVIL,ZESTRIL) 20 MG tablet Take 40 mg by mouth 2 (two) times daily.    . metoprolol succinate (TOPROL-XL) 100 MG 24 hr tablet Take 100 mg by mouth 2 (two) times daily. Take with or immediately following a meal.    . metroNIDAZOLE (METROCREAM) 0.75 % cream Apply 1 application topically 2 (two) times daily as needed (for rosacea.).     Marland Kitchen Multiple Vitamins-Minerals (ADULT ONE DAILY GUMMIES PO) Take 2 tablets by mouth at bedtime.    . mupirocin ointment (BACTROBAN) 2 % Apply 1 application topically 2 (two) times daily as needed. To left toe ulcer.    . ondansetron (ZOFRAN) 8 MG tablet Take by mouth.    . ondansetron (ZOFRAN-ODT) 8  MG disintegrating tablet Take 8 mg by mouth every 6 (six) hours as needed for nausea or vomiting.     Marland Kitchen PROCTOSOL HC 2.5 % rectal cream Apply 1 application topically daily as needed for hemorrhoids.   2  . triamcinolone ointment (KENALOG) 0.1 % Apply 1 application topically 2 (two) times daily as needed (for finger blister).      No current facility-administered medications on file prior to visit.     Allergies:  Allergies  Allergen Reactions  . Penicillins Anaphylaxis, Shortness Of Breath, Swelling and Palpitations    Has patient had a PCN reaction causing immediate rash, facial/tongue/throat swelling, SOB or lightheadedness with hypotension: Yes Has patient had a PCN reaction causing severe rash involving mucus membranes or skin necrosis: No Has patient had a PCN reaction that required hospitalization No Has patient had a PCN reaction occurring within the last 10 years: No If all of the above answers are "NO", then may proceed with Cephalosporin use.   . Clarithromycin Nausea And Vomiting    Any antibiotic (doxyclycine, etc)  . Adhesive [Tape] Itching    Redness  . Latex Itching and Rash    Blisters with prolonged contact  . Moxifloxacin Nausea And Vomiting    Needs Zofran or  Phenergan    Review of Systems:  CONSTITUTIONAL: No fevers, chills, night sweats, or weight loss.  EYES: No visual changes or eye pain ENT: No hearing changes.  No history of nose bleeds.   RESPIRATORY: No cough, wheezing and shortness of breath.   CARDIOVASCULAR: Negative for chest pain, and palpitations.   GI: +for abdominal discomfort, blood in stools or black stools.  +recent change in bowel habits.   GU:  +history of incontinence.   MUSCLOSKELETAL: No history of joint pain or swelling.  +myalgias.  +weakness SKIN: Negative for lesions, rash, and itching.   ENDOCRINE: Negative for cold or heat intolerance, polydipsia or goiter.   PSYCH:  No  depression +anxiety symptoms.   NEURO: As Above.   Vital Signs:  BP 120/80   Pulse 90   Ht _0  (1.676 m)   Wt 270 lb 8 oz (122.7 kg)   SpO2 94%   BMI 43.66 kg/m   Neurological Exam: MENTAL STATUS including orientation to time, place, person, recent and remote memory, attention span and concentration, language, and fund of knowledge is normal.  Speech is not dysarthric.  CRANIAL NERVES:  Pupils are round and reactive to light.  Extraocular muscles are intact.  Face is symmetric.    MOTOR: Tone is normal.  There is some atrophy of the intrinsic hand muscles.    Right Upper Extremity:    Left Upper Extremity:    Deltoid  5-/5   Deltoid  5-/5   Biceps  4+/5   Biceps  4+/5   Triceps  5-/5   Triceps  5-/5   Wrist extensors  5-/5   Wrist extensors  5-/5   Wrist flexors  4-/5   Wrist flexors  4-/5   Finger extensors  4-/5   Finger extensors  4-/5   Finger flexors  4-/5   Finger flexors  4-/5   Dorsal interossei  4-/5   Dorsal interossei  4-/5   Abductor pollicis  4/5   Abductor pollicis  4/5   Tone (Ashworth scale)  0  Tone (Ashworth scale)  0   Right Lower Extremity:    Left Lower Extremity:    Hip flexors  4+/5   Hip flexors  4-/5  Hip extensors  5/5   Hip extensors  5/5   Knee flexors  5/5   Knee flexors  4/5    Knee extensors          5-/5 Inversion 5/5  inversion 2/5  Eversion 5/5  Eversion 2/5  Dorsiflexors  4/5   Dorsiflexors  4/5   Plantarflexors  4/5   Plantarflexors  4/5   Toe extensors  4-/5   Toe extensors  1/5   Toe flexors  4-/5   Toe flexors  1/5   Tone (Ashworth scale)  0  Tone (Ashworth scale)  0    MSRs:    Right                                                                 Left brachioradialis 2+  brachioradialis 2+  biceps 2+  biceps 2+  triceps 2+  triceps 2+  patellar 3+  patellar 3+  ankle jerk 0  ankle jerk 0   SENSORY:  Vibration 50% at the knees bilaterally, absent at the ankles; Temperature is reduced over the hands and below the knees.   COORDINATION/GAIT:  Gait appears very unsteady with walker  Data: NCS/EMG of the arms 01/29/2017: The electrophysiologic testing is most consistent with an active on chronic, distal and symmetric sensorimotor polyneuropathy, demyelinating and axon loss in type. Overall, these findings are severe in degree electrically.   In addition, the presence of temporal dispersion suggests an acquired condition.  NCS/EMG of the legs 01/10/2017:    The electrophysiologic findings are most consistent with a subacute sensorimotor polyneuropathy, axon loss in type, affecting the lower extremities and worse on the left. Overall, these findings are severe in degree electrically.  Labs 01/25/2017:  ESR 10, CRP 0.3, vitamin B12 256, ACE 5, MMA 231, ANA neg, SSA/B neg, copper 92, vitamin B1 6*, SPEP with IFE no M protein  CSF 02/19/2017:  R1200, W0 G76 P37  ACE 10, IgG index 0.47, Lyme neg, cytology neg, no OCB  Left sural nerve biopsy performed on 10/22 shows chronic (predominant) and acute loss of large myelinated fibers with possible demyelination/remyelination process consistent with prior diagnosis of chronic inflammatory demyelinating poly-radicular neuropathy; marked thickening of the endoneutrial vessels consistent with diabetic  neuropathy, clinical correlation recommended.    IMPRESSION/PLAN: Mrs. Roberts-Lynch is a 52 year-old female with chronic inflammatory polyradiculoneuropathy manifesting with distal weakness and sensory loss.  She is here for a power wheelchair assessment.  I have reviewed and agree with occupational therapy evaluation.  She cannot use a walker or manual wheelchair due to progressive upper arm weakness.   I do not recommend a scooter due to inability to transfer and lack of tilt and recline options.    She currently uses a rollator to access the bathroom for bathing, toileting, and mobility around his home, but with her high risk of falls and arm weakness, rollator is not a safe option.  Therefore, she needs a power wheelchair to continue perform ADLs.    I strongly recommend a tilt and recline power wheelchair to natural progression of disease, relieve pressure, and decrease the number of transfers.   Seat elevator would benefit to allow her to reach various levels in her home to perform activities of daily living, such  as cooking, as well as transfers onto her bed or commode.  She is physically and cognitively intact to operate power chair.    Greater than 50% of this 30 minute visit was spent in counseling, explanation of diagnosis, planning of further management, and coordination of care.    Thank you for allowing me to participate in patient's care.  If I can answer any additional questions, I would be pleased to do so.    Sincerely,    Darrly Loberg K. Posey Pronto, DO

## 2018-07-18 NOTE — Progress Notes (Signed)
Note faxed.

## 2018-07-29 DIAGNOSIS — Z6841 Body Mass Index (BMI) 40.0 and over, adult: Secondary | ICD-10-CM | POA: Diagnosis not present

## 2018-07-29 DIAGNOSIS — M65342 Trigger finger, left ring finger: Secondary | ICD-10-CM | POA: Diagnosis not present

## 2018-07-29 DIAGNOSIS — I10 Essential (primary) hypertension: Secondary | ICD-10-CM | POA: Diagnosis not present

## 2018-07-31 DIAGNOSIS — G6181 Chronic inflammatory demyelinating polyneuritis: Secondary | ICD-10-CM | POA: Diagnosis not present

## 2018-08-01 DIAGNOSIS — M25562 Pain in left knee: Secondary | ICD-10-CM | POA: Diagnosis not present

## 2018-08-01 DIAGNOSIS — M65342 Trigger finger, left ring finger: Secondary | ICD-10-CM | POA: Diagnosis not present

## 2018-08-01 DIAGNOSIS — G6181 Chronic inflammatory demyelinating polyneuritis: Secondary | ICD-10-CM | POA: Diagnosis not present

## 2018-08-06 ENCOUNTER — Encounter: Payer: Self-pay | Admitting: Internal Medicine

## 2018-08-06 ENCOUNTER — Ambulatory Visit (INDEPENDENT_AMBULATORY_CARE_PROVIDER_SITE_OTHER): Payer: BLUE CROSS/BLUE SHIELD | Admitting: Internal Medicine

## 2018-08-06 VITALS — BP 152/98 | HR 89 | Ht 66.0 in | Wt 271.8 lb

## 2018-08-06 DIAGNOSIS — I951 Orthostatic hypotension: Secondary | ICD-10-CM

## 2018-08-06 DIAGNOSIS — R002 Palpitations: Secondary | ICD-10-CM

## 2018-08-06 DIAGNOSIS — R55 Syncope and collapse: Secondary | ICD-10-CM | POA: Diagnosis not present

## 2018-08-06 NOTE — Progress Notes (Signed)
Patient Care Team: Reynold Bowen, MD as PCP - General (Endocrinology)   HPI  [name] is a 52 y.o. female Seen in follow-up for recurrent syncope with hypertension, documented orthostatic hypotension in the context of diabetes with likely some degree of neuropathy;  intercurrently diagnosed with CIDP   We hve recommended compressive clothing as her systolic hypertension precludes sodium loading. Long-term use of beta blockers have been helpful with exercise intolerance    RX abdominal binder w improvement.  But now she using a compressive camisole.  She was getting skin erosion from the abdominal binder.  She has had syncope again.  This occurs about half the time at night.  She is continuing to struggle with her disability.  She is making significant progress     Records and Results Reviewed   Past Medical History:  Diagnosis Date  . Anginal pain ([place])    chest pain  heart cath done  . Anxiety   . Cancer [facility])    thyroid cancer  . Chronic bronchitis ([place])    "get it q yr"  . Chronic inflammatory demyelinating polyneuropathy ([place])   . Chronic kidney disease    Proteinuria  . Dyspnea    sob with exertion  . Early cataract    right  . Fibromyalgia   . GERD (gastroesophageal reflux disease)   . Heart murmur   . Hepatic adenoma   . High cholesterol   . History of gout   . History of hiatal hernia   . History of kidney stones   . Hypertension   . Hypothyroidism   . IBS (irritable bowel syndrome)   . Iron deficiency anemia    "comes and goes"  . Migraine    "@ least once/month" (02/18/2015)  . Obesity   . PONV (postoperative nausea and vomiting)    patch and anti nausea meds work well  . Proteinuria   . Spinal headache   . Syncope   . Tachycardia   . Type II diabetes mellitus ([place])    pt has an insulin pump  . Wears glasses     Past Surgical History:  Procedure Laterality Date  . ABDOMINAL HYSTERECTOMY  ~2012   "lap"  . ANKLE  RECONSTRUCTION Left 03/29/2016   Procedure: LEFT ANKLE LATERAL LIGAMENT RECONSTRUCTION AND PERONEAL TENDON REPAIR OR TENOLYSIS;  Surgeon: Wylene Simmer, MD;  Location: [place];  Service: Orthopedics;  Laterality: Left;  . BACK SURGERY    . CARDIAC CATHETERIZATION  04/2009   "clean"  . COLONOSCOPY  01/12/2015   Colonic polyps status post polypectomy, small internal hemorrhoids. Otherwise normal. Bx: mild chronic gastritis.  . CYSTOSCOPY WITH URETEROSCOPY, STONE BASKETRY AND STENT PLACEMENT  ~ 2006  . ESOPHAGOGASTRODUODENOSCOPY  07/21/2015   Esophageal stricture status post esophageal dilatation. Mild gastritis.  Marland Kitchen insulin pump     U 500  . IRRIGATION AND DEBRIDEMENT KNEE Left 04/03/2018   Procedure: Arthroscopic versus open irrigation and debridement  left knee;  Surgeon: Nicholes Stairs, MD;  Location: [place];  Service: Orthopedics;  Laterality: Left;  . LAPAROSCOPIC CHOLECYSTECTOMY  05/2004  . LUMBAR DISC SURGERY  08/2011  . SURAL NERVE BX Left 09/30/2017   Procedure: LEFT SURAL NERVE AND MUSCLE BIOPSY;  Surgeon: Newman Pies, MD;  Location: [place];  Service: Neurosurgery;  Laterality: Left;  LEFT SURAL NERVE AND MUSCLE BIOPSY  . THYROIDECTOMY N/A 02/18/2015   Procedure: TOTAL THYROIDECTOMY;  Surgeon: [name], MD;  Location: [place];  Service: General;  Laterality: N/A;  . TOTAL THYROIDECTOMY  02/18/2015    Current Outpatient Medications  Medication Sig Dispense Refill  . acetaminophen (TYLENOL) 500 MG tablet Take 1,000 mg by mouth every 6 (six) hours as needed.    . ALPRAZolam (XANAX) 0.5 MG tablet Take 0.5 mg by mouth every 6 (six) hours as needed for anxiety or sleep.     Marland Kitchen ammonium lactate (LAC-HYDRIN) 12 % lotion Apply 1 application topically 3 (three) times daily as needed for dry skin.     Marland Kitchen aspirin EC 81 MG tablet Take 81 mg by mouth daily.    Marland Kitchen atorvastatin (LIPITOR) 20 MG tablet Take 20 mg by mouth daily at 6 PM.     . b complex vitamins tablet Take 1 tablet by mouth daily.      Marland Kitchen BAYER CONTOUR NEXT TEST test strip 1 each by Other route See admin instructions. Testing 3-6 times daily  4  . Biotin 10000 MCG TABS Take 10,000 mcg by mouth at bedtime.    . cetirizine (ZYRTEC) 10 MG tablet Take 10 mg by mouth daily as needed for allergies.    . clotrimazole-betamethasone (LOTRISONE) lotion Apply 1 application topically 2 (two) times daily as needed. For skin folds  2  . cyclobenzaprine (FLEXERIL) 10 MG tablet Take 10 mg by mouth 3 (three) times daily as needed for muscle spasms (scheduled each night at bedtime).     . D3-50 50000 units capsule Take 50,000 Units by mouth every Saturday.  4  . Dermatological Products, Misc. (HYLATOPIC PLUS) CREA Apply 1 application topically 4 (four) times daily as needed (for hands irriation.).     Marland Kitchen dexlansoprazole (DEXILANT) 60 MG capsule Take 60 mg by mouth daily at 6 (six) AM.    . dicyclomine (BENTYL) 10 MG capsule Take 10 mg by mouth 2 (two) times daily.     Marland Kitchen doxycycline (PERIOSTAT) 20 MG tablet Take 20 mg by mouth 2 (two) times daily as needed. For rosacea.  3  . fluticasone (FLONASE) 50 MCG/ACT nasal spray Place 1 spray into the nose at bedtime. Reported on 12/27/2015    . furosemide (LASIX) 40 MG tablet Take 40 mg by mouth 2 (two) times daily.      Marland Kitchen gabapentin (NEURONTIN) 100 MG capsule Take 1 capsule (100 mg total) by mouth 3 (three) times daily. 360 capsule 2  . gabapentin (NEURONTIN) 300 MG capsule TAKE 2 CAPSULES BY MOUTH THREE TIMES DAILY 540 capsule 3  . halobetasol (ULTRAVATE) 0.05 % cream Apply 1 application topically 2 (two) times daily as needed (for finger blistering).     Marland Kitchen HYDROcodone-acetaminophen (NORCO) 7.5-325 MG tablet Take 1-2 tablets by mouth every 6 (six) hours as needed for moderate pain (for pain.). 40 tablet 0  . hyoscyamine (ANASPAZ) 0.125 MG TBDP disintergrating tablet Place 2 tablets under the tongue every 6 (six) hours as needed.    Marland Kitchen ibuprofen (ADVIL,MOTRIN) 200 MG tablet Take 600-800 mg by mouth every 8  (eight) hours as needed (for pain.).     Marland Kitchen immune globulin, human, (GAMMAGARD S/D LESS IGA) 10 g injection Inject 60 g into the vein every 21 ( twenty-one) days.    . Insulin Human (INSULIN PUMP) SOLN Inject into the skin.    Marland Kitchen insulin regular human CONCENTRATED (HUMULIN R) 500 UNIT/ML injection Inject 2 Units into the skin See admin instructions. 2.0 units /hour 0500-2359, 1.95units 7517-0017 with carb ratio 1:50 for meal time bolus    . INVOKAMET 50-1000  MG TABS Take 1 tablet by mouth 2 (two) times daily.   5  . ketoconazole (NIZORAL) 2 % cream Apply 1 application topically 2 (two) times daily as needed. For tops of toes.    Marland Kitchen levothyroxine (SYNTHROID, LEVOTHROID) 137 MCG tablet Take 274 mcg by mouth daily before breakfast.    . lisinopril (PRINIVIL,ZESTRIL) 20 MG tablet Take 40 mg by mouth 2 (two) times daily.    . metoprolol succinate (TOPROL-XL) 100 MG 24 hr tablet Take 100 mg by mouth 2 (two) times daily. Take with or immediately following a meal.    . metroNIDAZOLE (METROCREAM) 0.75 % cream Apply 1 application topically 2 (two) times daily as needed (for rosacea.).     Marland Kitchen Multiple Vitamin (MULTIVITAMIN) tablet Take 1 tablet by mouth daily.    . ondansetron (ZOFRAN) 8 MG tablet Take by mouth.    Marland Kitchen PROCTOSOL HC 2.5 % rectal cream Apply 1 application topically daily as needed for hemorrhoids.   2  . triamcinolone ointment (KENALOG) 0.1 % Apply 1 application topically 2 (two) times daily as needed (for finger blister).      No current facility-administered medications for this visit.     Allergies  Allergen Reactions  . Penicillins Anaphylaxis, Shortness Of Breath, Swelling and Palpitations    Has patient had a PCN reaction causing immediate rash, facial/tongue/throat swelling, SOB or lightheadedness with hypotension: Yes Has patient had a PCN reaction causing severe rash involving mucus membranes or skin necrosis: No Has patient had a PCN reaction that required hospitalization No Has  patient had a PCN reaction occurring within the last 10 years: No If all of the above answers are "NO", then may proceed with Cephalosporin use.   . Clarithromycin Nausea And Vomiting    Any antibiotic (doxyclycine, etc)  . Adhesive [Tape] Itching    Redness  . Latex Itching and Rash    Blisters with prolonged contact  . Moxifloxacin Nausea And Vomiting    Needs Zofran or Phenergan      Review of Systems negative except from HPI and PMH  Physical Exam BP (!) 152/98   Pulse 89   Ht 5\' 6"  (1.676 m)   Wt 271 lb 12.8 oz (123.3 kg)   SpO2 94%   BMI 43.87 kg/m  Well developed and nourished in no acute distress HENT normal Neck supple with JVP-flat Clear Regular rate and rhythm, no murmurs or gallops Abd-soft with active BS No Clubbing cyanosis edema Skin-warm and dry A & Oriented  Grossly normal sensory and motor function   ECG  89 16/08/37    Assessment and  Plan   Orthostatic hypotension  Syncope  Diabetes  Morbid obesity  CIDP ( chronic inflammatory demyelinating polyneuropathy)  Hypertension  With her nocturnal syncope, we will decrease her lisinopril from 40 twice daily--40 daily and her metoprolol 100 twice daily--100/50.  I will also have her raise the head of her bed 4 inches which will help decrease blood pressure.  Have given her the name restoration place.  I suggested she consider the use of thigh sleeves.  We spent more than 50% of our >25 min visit in face to face counseling regarding the above    Current medicines are reviewed at length with the patient today .  The patient does not  have concerns regarding medicines.

## 2018-08-06 NOTE — Patient Instructions (Signed)
Medication Instructions:  Your physician has recommended you make the following change in your medication:   1. Lisinopril-- Decrease to 40mg , 2 tablets, once in the morning 2. Metoprolol Succinate-- Decrease to 100mg , one tablet in the morning, 50 mg, one half tablet in the evening.   Labwork: None ordered.  Testing/Procedures: None ordered.  Follow-Up: Your physician wants you to follow-up in: 4 months with Dr Caryl Comes.   Any Other Special Instructions Will Be Listed Below (If Applicable).     If you need a refill on your cardiac medications before your next appointment, please call your pharmacy.

## 2018-08-08 DIAGNOSIS — E1165 Type 2 diabetes mellitus with hyperglycemia: Secondary | ICD-10-CM | POA: Diagnosis not present

## 2018-08-16 DIAGNOSIS — Z23 Encounter for immunization: Secondary | ICD-10-CM | POA: Diagnosis not present

## 2018-08-19 DIAGNOSIS — E1129 Type 2 diabetes mellitus with other diabetic kidney complication: Secondary | ICD-10-CM | POA: Diagnosis not present

## 2018-08-19 DIAGNOSIS — E11621 Type 2 diabetes mellitus with foot ulcer: Secondary | ICD-10-CM | POA: Diagnosis not present

## 2018-08-19 DIAGNOSIS — E1142 Type 2 diabetes mellitus with diabetic polyneuropathy: Secondary | ICD-10-CM | POA: Diagnosis not present

## 2018-08-19 DIAGNOSIS — G6181 Chronic inflammatory demyelinating polyneuritis: Secondary | ICD-10-CM | POA: Diagnosis not present

## 2018-08-20 DIAGNOSIS — G6181 Chronic inflammatory demyelinating polyneuritis: Secondary | ICD-10-CM | POA: Diagnosis not present

## 2018-08-21 DIAGNOSIS — G6181 Chronic inflammatory demyelinating polyneuritis: Secondary | ICD-10-CM | POA: Diagnosis not present

## 2018-09-01 ENCOUNTER — Encounter: Payer: Self-pay | Admitting: Neurology

## 2018-09-01 ENCOUNTER — Ambulatory Visit (INDEPENDENT_AMBULATORY_CARE_PROVIDER_SITE_OTHER): Payer: BLUE CROSS/BLUE SHIELD | Admitting: Neurology

## 2018-09-01 VITALS — BP 110/64 | HR 102 | Ht 66.0 in | Wt 271.5 lb

## 2018-09-01 DIAGNOSIS — G6181 Chronic inflammatory demyelinating polyneuritis: Secondary | ICD-10-CM

## 2018-09-01 NOTE — Patient Instructions (Addendum)
You are getting much stronger, keep up the good work!  We will contact [name] and see if they can infuse over one day  Return to clinic in 6 months

## 2018-09-01 NOTE — Progress Notes (Signed)
Follow-up Visit   Date: 09/01/18    [name] MRN: [id number] DOB: 1966-10-19   Interim History: [name] is a 52 y.o. right-handed Caucasian female with insulin-dependent diabetes mellitus, thyroid cancer s/p resection secondary hypothyroidism (2016), hypertension, hyperlipidemia, GERD, s/p left L5 hemilaminectomy and discectomy, dysautonomia, chronic left ankle pain returning for a follow-up of chronic inflammatory demyelinating polyradiculoneuropathy.  History of present illness: She was diagnosed with CIDP in the fall of 2018 after left sural nerve biopsy showed demyelinating and axon loss findings.  Several years preceding this, she was having painful paresthesias and diffuse weakness of the feet which extended in the legs and arms.  She also developed dysautonomic symptoms of bladder incontinence, orthostatic hypotension, and syncope. She was frequently falling and started using a rollator.  She was started on IVIG in February 2019 and has been experiencing problems with IV venous access and headaches, despite pretreating with tylenol.  She also has excessive fatigue and gets weaker by week 2 following her IVIG infusion. She is currently getting IVIG every 3 weeks.    UPDATE 09/01/2018:  She is here for follow-up visit.  She has noticed improved arm and hand strength with IVIG every 3 weeks.  She is able to grasp objects.  She has noticed symptoms tend to get worse during the 3rd week, before her next dose.  Urinary incontinence is also better.  Tingling is also improved, numbness remains unchanged.  She is having problems with IV access and would like to get a central port placed.  She also paid out-of-pocket for attendant control added to her powerchair, and should be getting this in the next few weeks.    Medications:  Current Outpatient Medications on File Prior to Visit  Medication Sig Dispense Refill  . acetaminophen (TYLENOL) 500 MG tablet Take 1,000  mg by mouth every 6 (six) hours as needed.    . ALPRAZolam (XANAX) 0.5 MG tablet Take 0.5 mg by mouth every 6 (six) hours as needed for anxiety or sleep.     Marland Kitchen ammonium lactate (LAC-HYDRIN) 12 % lotion Apply 1 application topically 3 (three) times daily as needed for dry skin.     Marland Kitchen aspirin EC 81 MG tablet Take 81 mg by mouth daily.    Marland Kitchen atorvastatin (LIPITOR) 20 MG tablet Take 20 mg by mouth daily at 6 PM.     . b complex vitamins tablet Take 1 tablet by mouth daily.    Marland Kitchen BAYER CONTOUR NEXT TEST test strip 1 each by Other route See admin instructions. Testing 3-6 times daily  4  . Biotin 10000 MCG TABS Take 10,000 mcg by mouth at bedtime.    . cetirizine (ZYRTEC) 10 MG tablet Take 10 mg by mouth daily as needed for allergies.    . clotrimazole-betamethasone (LOTRISONE) lotion Apply 1 application topically 2 (two) times daily as needed. For skin folds  2  . cyclobenzaprine (FLEXERIL) 10 MG tablet Take 10 mg by mouth 3 (three) times daily as needed for muscle spasms (scheduled each night at bedtime).     . D3-50 50000 units capsule Take 50,000 Units by mouth every Saturday.  4  . Dermatological Products, Misc. (HYLATOPIC PLUS) CREA Apply 1 application topically 4 (four) times daily as needed (for hands irriation.).     Marland Kitchen dexlansoprazole (DEXILANT) 60 MG capsule Take 60 mg by mouth daily at 6 (six) AM.    . dicyclomine (BENTYL) 10 MG capsule Take 10 mg by mouth 2 (  two) times daily.     Marland Kitchen doxycycline (PERIOSTAT) 20 MG tablet Take 20 mg by mouth 2 (two) times daily as needed. For rosacea.  3  . fluticasone (FLONASE) 50 MCG/ACT nasal spray Place 1 spray into the nose at bedtime. Reported on 12/27/2015    . furosemide (LASIX) 40 MG tablet Take 40 mg by mouth 2 (two) times daily.      Marland Kitchen gabapentin (NEURONTIN) 100 MG capsule Take 1 capsule (100 mg total) by mouth 3 (three) times daily. 360 capsule 2  . gabapentin (NEURONTIN) 300 MG capsule TAKE 2 CAPSULES BY MOUTH THREE TIMES DAILY 540 capsule 3  .  halobetasol (ULTRAVATE) 0.05 % cream Apply 1 application topically 2 (two) times daily as needed (for finger blistering).     Marland Kitchen HYDROcodone-acetaminophen (NORCO) 7.5-325 MG tablet Take 1-2 tablets by mouth every 6 (six) hours as needed for moderate pain (for pain.). 40 tablet 0  . hyoscyamine (ANASPAZ) 0.125 MG TBDP disintergrating tablet Place 2 tablets under the tongue every 6 (six) hours as needed.    Marland Kitchen ibuprofen (ADVIL,MOTRIN) 200 MG tablet Take 600-800 mg by mouth every 8 (eight) hours as needed (for pain.).     Marland Kitchen immune globulin, human, (GAMMAGARD S/D LESS IGA) 10 g injection Inject 60 g into the vein every 21 ( twenty-one) days.    . Insulin Human (INSULIN PUMP) SOLN Inject into the skin.    Marland Kitchen insulin regular human CONCENTRATED (HUMULIN R) 500 UNIT/ML injection Inject 2 Units into the skin See admin instructions. 2.0 units /hour 0500-2359, 1.95units 2094-7096 with carb ratio 1:50 for meal time bolus    . INVOKAMET 50-1000 MG TABS Take 1 tablet by mouth 2 (two) times daily.   5  . ketoconazole (NIZORAL) 2 % cream Apply 1 application topically 2 (two) times daily as needed. For tops of toes.    Marland Kitchen levothyroxine (SYNTHROID, LEVOTHROID) 137 MCG tablet Take 274 mcg by mouth daily before breakfast.    . lisinopril (PRINIVIL,ZESTRIL) 20 MG tablet Take 40 mg by mouth once. Once daily.    . metoprolol succinate (TOPROL-XL) 100 MG 24 hr tablet Take 100 mg by mouth every morning. Take 121m in the AM, 530min the PM     . metroNIDAZOLE (METROCREAM) 0.75 % cream Apply 1 application topically 2 (two) times daily as needed (for rosacea.).     . Marland Kitchenultiple Vitamin (MULTIVITAMIN) tablet Take 1 tablet by mouth daily.    . ondansetron (ZOFRAN) 8 MG tablet Take by mouth.    . Pediatric Multiple Vit-C-FA (MULTIVITAMIN ANIMAL SHAPES, WITH CA/FA,) with C & FA chewable tablet Chew by mouth.    . Marland KitchenROCTOSOL HC 2.5 % rectal cream Apply 1 application topically daily as needed for hemorrhoids.   2  . triamcinolone ointment  (KENALOG) 0.1 % Apply 1 application topically 2 (two) times daily as needed (for finger blister).      No current facility-administered medications on file prior to visit.     Allergies:  Allergies  Allergen Reactions  . Penicillins Anaphylaxis, Shortness Of Breath, Swelling and Palpitations    Has patient had a PCN reaction causing immediate rash, facial/tongue/throat swelling, SOB or lightheadedness with hypotension: Yes Has patient had a PCN reaction causing severe rash involving mucus membranes or skin necrosis: No Has patient had a PCN reaction that required hospitalization No Has patient had a PCN reaction occurring within the last 10 years: No If all of the above answers are "NO", then may proceed with Cephalosporin  use.   . Clarithromycin Nausea And Vomiting    Any antibiotic (doxyclycine, etc)  . Adhesive [Tape] Itching    Redness  . Latex Itching and Rash    Blisters with prolonged contact  . Moxifloxacin Nausea And Vomiting    Needs Zofran or Phenergan    Review of Systems:  CONSTITUTIONAL: No fevers, chills, night sweats, or weight loss.  EYES: No visual changes or eye pain ENT: No hearing changes.  No history of nose bleeds.   RESPIRATORY: No cough, wheezing and shortness of breath.   CARDIOVASCULAR: Negative for chest pain, and palpitations.   GI: +for abdominal discomfort, blood in stools or black stools.  No recent change in bowel habits.   GU:  +history of incontinence.   MUSCLOSKELETAL: No history of joint pain or swelling.  +myalgias.  +weakness SKIN: Negative for lesions, rash, and itching.   ENDOCRINE: Negative for cold or heat intolerance, polydipsia or goiter.   PSYCH:  No  depression +anxiety symptoms.   NEURO: As Above.   Vital Signs:  BP 110/64   Pulse (!) 102   Ht '5\' 6"'  (1.676 m)   Wt 271 lb 8 oz (123.2 kg)   SpO2 96%   BMI 43.82 kg/m   General Medical Exam:   General:  Well appearing, comfortable  Eyes/ENT: see cranial nerve examination.    Neck: No masses appreciated.  Full range of motion without tenderness.  No carotid bruits. Respiratory:  Clear to auscultation, good air entry bilaterally.   Cardiac:  Regular rate and rhythm, no murmur.   Ext:  Circumferential erythematous rash over the palm and in between the first web space   Neurological Exam: MENTAL STATUS including orientation to time, place, person, recent and remote memory, attention span and concentration, language, and fund of knowledge is normal.  Speech is not dysarthric.  CRANIAL NERVES:  Pupils are round and reactive to light.  Extraocular muscles are intact.  Face is symmetric.  Palate elevates symmetrically.   MOTOR: Tone is normal.  There is some atrophy of the intrinsic hand muscles.    Right Upper Extremity:    Left Upper Extremity:    Deltoid  5/5   Deltoid  5/5   Biceps  5-/5   Biceps  5-/5   Triceps  5/5   Triceps  5/5   Wrist extensors  5-/5   Wrist extensors  5-/5   Wrist flexors  4+/5   Wrist flexors  4+/5   Finger extensors  4/5   Finger extensors  4/5   Finger flexors  4-/5   Finger flexors  4/5   Dorsal interossei  4/5   Dorsal interossei  4/5   Abductor pollicis  4/5   Abductor pollicis  4/5   Tone (Ashworth scale)  0  Tone (Ashworth scale)  0   Right Lower Extremity:    Left Lower Extremity:    Hip flexors  5-/5   Hip flexors  4/5   Hip extensors  5/5   Hip extensors  5/5   Knee flexors  5/5   Knee flexors  4/5   Knee extensors          5-/5 Inversion 5/5  inversion 2/5  Eversion 5/5  Eversion 2/5  Dorsiflexors  4/5   Dorsiflexors  4/5   Plantarflexors  4/5   Plantarflexors  4/5   Toe extensors  4-/5   Toe extensors  1/5   Toe flexors  4-/5   Toe flexors  1/5   Tone (Ashworth scale)  0  Tone (Ashworth scale)  0    MSRs:    Right                                                                 Left brachioradialis 2+  brachioradialis 2+  biceps 2+  biceps 2+  triceps 2+  triceps 2+  patellar 3+   patellar 3+  ankle jerk 0  ankle jerk 0   SENSORY:  Vibration 50% at the knees bilaterally, absent at the ankles; vibration is absent at the MCP bilaterally.    COORDINATION/GAIT:  Gait appears very unsteady with walker  Data: NCS/EMG of the arms 01/29/2017: The electrophysiologic testing is most consistent with an active on chronic, distal and symmetric sensorimotor polyneuropathy, demyelinating and axon loss in type. Overall, these findings are severe in degree electrically.   In addition, the presence of temporal dispersion suggests an acquired condition.  NCS/EMG of the legs 01/10/2017:    The electrophysiologic findings are most consistent with a subacute sensorimotor polyneuropathy, axon loss in type, affecting the lower extremities and worse on the left. Overall, these findings are severe in degree electrically.  Labs 01/25/2017:  ESR 10, CRP 0.3, vitamin B12 256, ACE 5, MMA 231, ANA neg, SSA/B neg, copper 92, vitamin B1 6*, SPEP with IFE no M protein  CSF 02/19/2017:  R1200, W0 G76 P37  ACE 10, IgG index 0.47, Lyme neg, cytology neg, no OCB  MRI lumbar spine wwo contrast 02/28/2017: 1. Stable appearance of the lumbar spine. No acute abnormality identified. 2. Postoperative changes on the left L5-S1 without residual stenosis. 3. Small left subarticular disc protrusion at L3-4 encroaching upon the left lateral recess, and potentially affecting the descending left L4 nerve root. 4. Mild bilateral foraminal stenosis at L4-5 related to disc bulge and facet degeneration.  MRI cervical spine wwo contrast 09/09/2017: 1. Normal MRI appearance of the cervical spinal cord. No cord signal abnormality is identified. 2. Multilobulated disc protrusion at C4-5, flattening the right hemi cord without cord signal changes. 3. Broad posterior disc bulge at C6-7 with resultant mild canal and left C7 foraminal stenosis. 4. Central disc protrusion at C3-4 with resultant mild spinal stenosis. No significant  cord deformity.  MRI brain wwo contrast 09/09/2017:  Normal MRI of the brain. No acute intracranial abnormality identified.  Left sural nerve biopsy performed on 10/22 shows chronic (predominant) and acute loss of large myelinated fibers with possible demyelination/remyelination process consistent with prior diagnosis of chronic inflammatory demyelinating poly-radicular neuropathy; marked thickening of the endoneutrial vessels consistent with diabetic neuropathy, clinical correlation recommended.   Left vastus lateralis muscle biopsy: 2 isolated foci of very mild perivascular, possibly vascular, inflammation, and mild nonspecific mild myofiber atrophy and skeletal muscle example without myofiber necrosis.   IMPRESSION/PLAN: 1.  Chronic inflammatory polyradiculoneuropathy with distal weakness and sensory loss (worse on the left), dysautonomia.  Diagnosed by sural nerve biopsy (09/2017); CSF testing was normal, however NCS/EMG was highly suggestive of a subacute polyneuropathy.   She started IVIG in February 2019 and has been continued on IVIG 1g/kg every 3 weeks.  Unfortunately, she is having issues with poor IV access.  As well contact her pharmacy to see her monthly infusion can be administered for  1 day, to continue IV access.  Otherwise, she may need central port-a cath. Exam shows improved proximal strength in the upper and lower extremities.  She continues to have distal weakness of the hands and feet. For her painful paresthesias, continue 621m TID.  Continue PT exercises  2.  Chronic low back pain.  Continue flexeril 570mat bedtime.  Return to clinic in 6 months  Greater than 50% of this 35 minute visit was spent in counseling, explanation of diagnosis, planning of further management, and coordination of care.     Thank you for allowing me to participate in patient's care.  If I can answer any additional questions, I would be pleased to do so.    Sincerely,    Laurielle Selmon K. PaPosey Pronto DO

## 2018-09-02 ENCOUNTER — Other Ambulatory Visit: Payer: Self-pay | Admitting: Gastroenterology

## 2018-09-02 DIAGNOSIS — L918 Other hypertrophic disorders of the skin: Secondary | ICD-10-CM | POA: Diagnosis not present

## 2018-09-02 DIAGNOSIS — L301 Dyshidrosis [pompholyx]: Secondary | ICD-10-CM | POA: Diagnosis not present

## 2018-09-03 DIAGNOSIS — Z1231 Encounter for screening mammogram for malignant neoplasm of breast: Secondary | ICD-10-CM | POA: Diagnosis not present

## 2018-09-03 DIAGNOSIS — Z01419 Encounter for gynecological examination (general) (routine) without abnormal findings: Secondary | ICD-10-CM | POA: Diagnosis not present

## 2018-09-05 DIAGNOSIS — G6181 Chronic inflammatory demyelinating polyneuritis: Secondary | ICD-10-CM | POA: Diagnosis not present

## 2018-09-05 DIAGNOSIS — R269 Unspecified abnormalities of gait and mobility: Secondary | ICD-10-CM | POA: Diagnosis not present

## 2018-09-08 ENCOUNTER — Ambulatory Visit: Payer: BLUE CROSS/BLUE SHIELD | Admitting: Neurology

## 2018-09-10 ENCOUNTER — Telehealth: Payer: Self-pay | Admitting: Neurology

## 2018-09-10 DIAGNOSIS — Z789 Other specified health status: Secondary | ICD-10-CM

## 2018-09-10 DIAGNOSIS — G6181 Chronic inflammatory demyelinating polyneuritis: Secondary | ICD-10-CM

## 2018-09-10 NOTE — Telephone Encounter (Signed)
Agree with referral for port, thanks

## 2018-09-10 NOTE — Telephone Encounter (Signed)
Patient called to see the status of the approval for the port for the IVIG infusions please call either her cell [phone] or home [phone]

## 2018-09-10 NOTE — Telephone Encounter (Signed)
Order placed for [facility] placement with IR at hospital. Anise Salvo at [phone] and [facility] about getting this scheduled. Awaiting call back.

## 2018-09-10 NOTE — Telephone Encounter (Signed)
Looking at last office note, it states she "may need port". Nothing else is documented as far as contacting pharmacy or if a referral for a port has been sent. Okay to refer for one?

## 2018-09-11 ENCOUNTER — Telehealth (HOSPITAL_COMMUNITY): Payer: Self-pay | Admitting: Radiology

## 2018-09-11 DIAGNOSIS — G6181 Chronic inflammatory demyelinating polyneuritis: Secondary | ICD-10-CM | POA: Diagnosis not present

## 2018-09-11 NOTE — Telephone Encounter (Signed)
Patient scheduled on 09/22/18 according to Epic. They contacted patient directly.

## 2018-09-11 NOTE — Telephone Encounter (Signed)
Called pt, left VM to schedule port placement

## 2018-09-18 ENCOUNTER — Other Ambulatory Visit: Payer: Self-pay | Admitting: Student

## 2018-09-19 ENCOUNTER — Other Ambulatory Visit: Payer: Self-pay | Admitting: Physician Assistant

## 2018-09-22 ENCOUNTER — Encounter (HOSPITAL_COMMUNITY): Payer: Self-pay

## 2018-09-22 ENCOUNTER — Ambulatory Visit (HOSPITAL_COMMUNITY)
Admission: RE | Admit: 2018-09-22 | Discharge: 2018-09-22 | Disposition: A | Discharge status: Home / Self Care | Payer: BLUE CROSS/BLUE SHIELD | Source: Ambulatory Visit | Attending: Neurology | Admitting: Neurology

## 2018-09-22 DIAGNOSIS — Z6841 Body Mass Index (BMI) 40.0 and over, adult: Secondary | ICD-10-CM | POA: Insufficient documentation

## 2018-09-22 DIAGNOSIS — Z794 Long term (current) use of insulin: Secondary | ICD-10-CM | POA: Insufficient documentation

## 2018-09-22 DIAGNOSIS — Z91040 Latex allergy status: Secondary | ICD-10-CM | POA: Insufficient documentation

## 2018-09-22 DIAGNOSIS — Z7951 Long term (current) use of inhaled steroids: Secondary | ICD-10-CM | POA: Diagnosis not present

## 2018-09-22 DIAGNOSIS — K589 Irritable bowel syndrome without diarrhea: Secondary | ICD-10-CM | POA: Diagnosis not present

## 2018-09-22 DIAGNOSIS — R7989 Other specified abnormal findings of blood chemistry: Secondary | ICD-10-CM | POA: Diagnosis not present

## 2018-09-22 DIAGNOSIS — E669 Obesity, unspecified: Secondary | ICD-10-CM | POA: Diagnosis not present

## 2018-09-22 DIAGNOSIS — F419 Anxiety disorder, unspecified: Secondary | ICD-10-CM | POA: Diagnosis not present

## 2018-09-22 DIAGNOSIS — N189 Chronic kidney disease, unspecified: Secondary | ICD-10-CM | POA: Diagnosis not present

## 2018-09-22 DIAGNOSIS — G6181 Chronic inflammatory demyelinating polyneuritis: Secondary | ICD-10-CM

## 2018-09-22 DIAGNOSIS — E7800 Pure hypercholesterolemia, unspecified: Secondary | ICD-10-CM | POA: Diagnosis not present

## 2018-09-22 DIAGNOSIS — M109 Gout, unspecified: Secondary | ICD-10-CM | POA: Diagnosis not present

## 2018-09-22 DIAGNOSIS — Z7982 Long term (current) use of aspirin: Secondary | ICD-10-CM | POA: Diagnosis not present

## 2018-09-22 DIAGNOSIS — K219 Gastro-esophageal reflux disease without esophagitis: Secondary | ICD-10-CM | POA: Diagnosis not present

## 2018-09-22 DIAGNOSIS — M797 Fibromyalgia: Secondary | ICD-10-CM | POA: Insufficient documentation

## 2018-09-22 DIAGNOSIS — E039 Hypothyroidism, unspecified: Secondary | ICD-10-CM | POA: Diagnosis not present

## 2018-09-22 DIAGNOSIS — Z880 Allergy status to penicillin: Secondary | ICD-10-CM | POA: Insufficient documentation

## 2018-09-22 DIAGNOSIS — E1122 Type 2 diabetes mellitus with diabetic chronic kidney disease: Secondary | ICD-10-CM | POA: Insufficient documentation

## 2018-09-22 DIAGNOSIS — I129 Hypertensive chronic kidney disease with stage 1 through stage 4 chronic kidney disease, or unspecified chronic kidney disease: Secondary | ICD-10-CM | POA: Diagnosis not present

## 2018-09-22 DIAGNOSIS — Z789 Other specified health status: Secondary | ICD-10-CM

## 2018-09-22 DIAGNOSIS — Z8249 Family history of ischemic heart disease and other diseases of the circulatory system: Secondary | ICD-10-CM | POA: Diagnosis not present

## 2018-09-22 DIAGNOSIS — Z452 Encounter for adjustment and management of vascular access device: Secondary | ICD-10-CM | POA: Diagnosis not present

## 2018-09-22 HISTORY — PX: IR IMAGING GUIDED PORT INSERTION: IMG5740

## 2018-09-22 LAB — CBC
HCT: 43.1 % (ref 36.0–46.0)
Hemoglobin: 14.1 g/dL (ref 12.0–15.0)
MCH: 29.3 pg (ref 26.0–34.0)
MCHC: 32.7 g/dL (ref 30.0–36.0)
MCV: 89.4 fL (ref 80.0–100.0)
Platelets: 177 10*3/uL (ref 150–400)
RBC: 4.82 MIL/uL (ref 3.87–5.11)
RDW: 14.1 % (ref 11.5–15.5)
WBC: 4.5 10*3/uL (ref 4.0–10.5)
nRBC: 0 % (ref 0.0–0.2)

## 2018-09-22 LAB — PROTIME-INR
INR: 1.01
Prothrombin Time: 13.2 seconds (ref 11.4–15.2)

## 2018-09-22 LAB — GLUCOSE, CAPILLARY
Glucose-Capillary: 82 mg/dL (ref 70–99)
Glucose-Capillary: 134 mg/dL — ABNORMAL HIGH (ref 70–99)

## 2018-09-22 LAB — APTT: aPTT: 26 seconds (ref 24–36)

## 2018-09-22 MED ORDER — LIDOCAINE-EPINEPHRINE (PF) 1 %-1:200000 IJ SOLN
INTRAMUSCULAR | Status: AC
  Filled 2018-09-22: qty 30

## 2018-09-22 MED ORDER — FENTANYL CITRATE (PF) 100 MCG/2ML IJ SOLN
INTRAMUSCULAR | Status: AC
  Filled 2018-09-22: qty 2

## 2018-09-22 MED ORDER — HEPARIN SOD (PORK) LOCK FLUSH 100 UNIT/ML IV SOLN
INTRAVENOUS | Status: AC | PRN
  Administered 2018-09-22: 500 [IU] via INTRAVENOUS

## 2018-09-22 MED ORDER — VANCOMYCIN HCL IN DEXTROSE 1-5 GM/200ML-% IV SOLN
INTRAVENOUS | Status: AC | PRN
  Administered 2018-09-22: 1000 mg via INTRAVENOUS

## 2018-09-22 MED ORDER — SODIUM CHLORIDE 0.9 % IV SOLN: INTRAVENOUS | Status: DC

## 2018-09-22 MED ORDER — MIDAZOLAM HCL 2 MG/2ML IJ SOLN
INTRAMUSCULAR | Status: AC | PRN
  Administered 2018-09-22 (×2): 1 mg via INTRAVENOUS

## 2018-09-22 MED ORDER — VANCOMYCIN HCL IN DEXTROSE 1-5 GM/200ML-% IV SOLN
INTRAVENOUS | Status: AC
  Filled 2018-09-22: qty 200

## 2018-09-22 MED ORDER — VANCOMYCIN HCL IN DEXTROSE 1-5 GM/200ML-% IV SOLN: 1000.0000 mg | Freq: Once | INTRAVENOUS | Status: DC

## 2018-09-22 MED ORDER — FENTANYL CITRATE (PF) 100 MCG/2ML IJ SOLN
INTRAMUSCULAR | Status: AC | PRN
  Administered 2018-09-22: 50 ug via INTRAVENOUS

## 2018-09-22 MED ORDER — MIDAZOLAM HCL 2 MG/2ML IJ SOLN
INTRAMUSCULAR | Status: AC
  Filled 2018-09-22: qty 2

## 2018-09-22 MED ORDER — SODIUM CHLORIDE 0.9 % IV SOLN
INTRAVENOUS | Status: AC | PRN
  Administered 2018-09-22: 10 mL/h via INTRAVENOUS

## 2018-09-22 MED ORDER — HEPARIN SOD (PORK) LOCK FLUSH 100 UNIT/ML IV SOLN
INTRAVENOUS | Status: AC
  Filled 2018-09-22: qty 5

## 2018-09-22 MED ORDER — LIDOCAINE-EPINEPHRINE (PF) 1 %-1:200000 IJ SOLN
INTRAMUSCULAR | Status: AC | PRN
  Administered 2018-09-22: 20 mL via INTRADERMAL

## 2018-09-22 NOTE — Discharge Instructions (Signed)
Implanted Port Insertion, Care After °This sheet gives you information about how to care for yourself after your procedure. Your health care provider may also give you more specific instructions. If you have problems or questions, contact your health care provider. °What can I expect after the procedure? °After your procedure, it is common to have: °· Discomfort at the port insertion site. °· Bruising on the skin over the port. This should improve over 3-4 days. ° °Follow these instructions at home: °Port care °· After your port is placed, you will get a manufacturer's information card. The card has information about your port. Keep this card with you at all times. °· Take care of the port as told by your health care provider. Ask your health care provider if you or a family member can get training for taking care of the port at home. A home health care nurse may also take care of the port. °· Make sure to remember what type of port you have. °Incision care °· Follow instructions from your health care provider about how to take care of your port insertion site. Make sure you: °? Wash your hands with soap and water before you change your bandage (dressing). If soap and water are not available, use hand sanitizer. °? Change your dressing as told by your health care provider. °? Leave stitches (sutures), skin glue, or adhesive strips in place. These skin closures may need to stay in place for 2 weeks or longer. If adhesive strip edges start to loosen and curl up, you may trim the loose edges. Do not remove adhesive strips completely unless your health care provider tells you to do that. °· Check your port insertion site every day for signs of infection. Check for: °? More redness, swelling, or pain. °? More fluid or blood. °? Warmth. °? Pus or a bad smell. °General instructions °· Do not take baths, swim, or use a hot tub until your health care provider approves. °· Do not lift anything that is heavier than 10 lb (4.5  kg) for a week, or as told by your health care provider. °· Ask your health care provider when it is okay to: °? Return to work or school. °? Resume usual physical activities or sports. °· Do not drive for 24 hours if you were given a medicine to help you relax (sedative). °· Take over-the-counter and prescription medicines only as told by your health care provider. °· Wear a medical alert bracelet in case of an emergency. This will tell any health care providers that you have a port. °· Keep all follow-up visits as told by your health care provider. This is important. °Contact a health care provider if: °· You cannot flush your port with saline as directed, or you cannot draw blood from the port. °· You have a fever or chills. °· You have more redness, swelling, or pain around your port insertion site. °· You have more fluid or blood coming from your port insertion site. °· Your port insertion site feels warm to the touch. °· You have pus or a bad smell coming from the port insertion site. °Get help right away if: °· You have chest pain or shortness of breath. °· You have bleeding from your port that you cannot control. °Summary °· Take care of the port as told by your health care provider. °· Change your dressing as told by your health care provider. °· Keep all follow-up visits as told by your health care provider. °  This information is not intended to replace advice given to you by your health care provider. Make sure you discuss any questions you have with your health care provider. °Document Released: 09/16/2013 Document Revised: 10/17/2016 Document Reviewed: 10/17/2016 °[employer]vier Interactive Patient Education © 2017 [employer] ° °

## 2018-09-22 NOTE — H&P (Signed)
Chief Complaint: Poor venous access  Referring Physician(s): Patel,Donika K  Supervising Physician: Arne Cleveland  Patient Status: [facility] - Out-pt  History of Present Illness: [name] is a 52 y.o. female with chronic inflammatory demyelinating polyneuropathy who requires IVIG infusion every 3 weeks.  She is having issues with venous access and is requesting placement of a Port A Cath.  She feels ok today, no fever/chills, no N/V. ROS otherwise negative.  She is NPO. No blood thinners.  Past Medical History:  Diagnosis Date  . Anginal pain ([place])    chest pain  heart cath done  . Anxiety   . Cancer [facility])    thyroid cancer  . Chronic bronchitis ([place])    "get it q yr"  . Chronic inflammatory demyelinating polyneuropathy ([place])   . Chronic kidney disease    Proteinuria  . Dyspnea    sob with exertion  . Early cataract    right  . Fibromyalgia   . GERD (gastroesophageal reflux disease)   . Heart murmur   . Hepatic adenoma   . High cholesterol   . History of gout   . History of hiatal hernia   . History of kidney stones   . Hypertension   . Hypothyroidism   . IBS (irritable bowel syndrome)   . Iron deficiency anemia    "comes and goes"  . Migraine    "@ least once/month" (02/18/2015)  . Obesity   . PONV (postoperative nausea and vomiting)    patch and anti nausea meds work well  . Proteinuria   . Spinal headache   . Syncope   . Tachycardia   . Type II diabetes mellitus ([place])    pt has an insulin pump  . Wears glasses     Past Surgical History:  Procedure Laterality Date  . ABDOMINAL HYSTERECTOMY  ~2012   "lap"  . ANKLE RECONSTRUCTION Left 03/29/2016   Procedure: LEFT ANKLE LATERAL LIGAMENT RECONSTRUCTION AND PERONEAL TENDON REPAIR OR TENOLYSIS;  Surgeon: Wylene Simmer, MD;  Location: [place];  Service: Orthopedics;  Laterality: Left;  . BACK SURGERY    . CARDIAC CATHETERIZATION  04/2009   "clean"  . COLONOSCOPY  01/12/2015   Colonic  polyps status post polypectomy, small internal hemorrhoids. Otherwise normal. Bx: mild chronic gastritis.  . CYSTOSCOPY WITH URETEROSCOPY, STONE BASKETRY AND STENT PLACEMENT  ~ 2006  . ESOPHAGOGASTRODUODENOSCOPY  07/21/2015   Esophageal stricture status post esophageal dilatation. Mild gastritis.  Marland Kitchen insulin pump     U 500  . IRRIGATION AND DEBRIDEMENT KNEE Left 04/03/2018   Procedure: Arthroscopic versus open irrigation and debridement  left knee;  Surgeon: Nicholes Stairs, MD;  Location: [place];  Service: Orthopedics;  Laterality: Left;  . LAPAROSCOPIC CHOLECYSTECTOMY  05/2004  . LUMBAR DISC SURGERY  08/2011  . SURAL NERVE BX Left 09/30/2017   Procedure: LEFT SURAL NERVE AND MUSCLE BIOPSY;  Surgeon: Newman Pies, MD;  Location: [place];  Service: Neurosurgery;  Laterality: Left;  LEFT SURAL NERVE AND MUSCLE BIOPSY  . THYROIDECTOMY N/A 02/18/2015   Procedure: TOTAL THYROIDECTOMY;  Surgeon: [name], MD;  Location: [place];  Service: General;  Laterality: N/A;  . TOTAL THYROIDECTOMY  02/18/2015    Allergies: Penicillins; Clarithromycin; Other; Adhesive [tape]; Latex; and Moxifloxacin  Medications: Prior to Admission medications   Medication Sig Start Date End Date Taking? Authorizing Provider  acetaminophen (TYLENOL) 500 MG tablet Take 1,000 mg by mouth every 6 (six) hours as needed for moderate  pain or headache.    Yes [provider]  ALPRAZolam Duanne Moron) 0.5 MG tablet Take 0.5 mg by mouth every 6 (six) hours as needed for anxiety or sleep.    Yes [provider]  ammonium lactate (LAC-HYDRIN) 12 % lotion Apply 1 application topically 2 (two) times daily.    Yes [provider]  aspirin EC 81 MG tablet Take 81 mg by mouth daily.   Yes [provider]  atorvastatin (LIPITOR) 20 MG tablet Take 20 mg by mouth daily at 6 PM.    Yes [provider]  b complex vitamins tablet Take 1 tablet by mouth daily.   Yes [provider]  Biotin  10000 MCG TABS Take 10,000 mcg by mouth at bedtime.   Yes [provider]  clobetasol cream (TEMOVATE) 7.25 % Apply 1 application topically 2 (two) times daily.   Yes [provider]  clotrimazole-betamethasone (LOTRISONE) lotion Apply 1 application topically 2 (two) times daily as needed (for irritation).  08/30/17  Yes [provider]  cyclobenzaprine (FLEXERIL) 10 MG tablet Take 10 mg by mouth See admin instructions. Take 10 mg by mouth at bedtime. Take 10 mg by mouth every 8 hours as needed for muscle spasms   Yes [provider]  D3-50 50000 units capsule Take 50,000 Units by mouth every Saturday. 08/30/17  Yes [provider]  Dermatological Products, [place]. (HYLATOPIC PLUS) CREA Apply 1 application topically 3 (three) times daily.    Yes [provider]  dexlansoprazole (DEXILANT) 60 MG capsule Take 60 mg by mouth daily at 6 (six) AM.    Yes [provider]  dicyclomine (BENTYL) 10 MG capsule TAKE 1 CAPSULE BY MOUTH 4 TIMES DAILY BEFORE MEAL(S) AND AT BEDTIME Patient taking differently: Take 10 mg by mouth 2 (two) times daily.  09/04/18  Yes Jackquline Denmark, MD  doxycycline (PERIOSTAT) 20 MG tablet Take 20 mg by mouth 2 (two) times daily.  09/05/17  Yes [provider]  fluticasone (FLONASE) 50 MCG/ACT nasal spray Place 1 spray into both nostrils at bedtime.    Yes [provider]  furosemide (LASIX) 40 MG tablet Take 40 mg by mouth daily.    Yes [provider]  gabapentin (NEURONTIN) 100 MG capsule Take 1 capsule (100 mg total) by mouth 3 (three) times daily. Patient taking differently: Take 100 mg by mouth 3 (three) times daily as needed (instead of 300 mg dose).  07/09/18  Yes Patel, Donika K, DO  gabapentin (NEURONTIN) 300 MG capsule TAKE 2 CAPSULES BY MOUTH THREE TIMES DAILY Patient taking differently: Take 600 mg by mouth 3 (three) times daily.  06/04/18  Yes Patel, Donika K, DO  halobetasol (ULTRAVATE)  0.05 % cream Apply 1 application topically 3 (three) times daily as needed (for finger blistering).    Yes [provider]  HYDROcodone-acetaminophen (NORCO) 7.5-325 MG tablet Take 1-2 tablets by mouth every 6 (six) hours as needed for moderate pain (for pain.). Patient taking differently: Take 1-2 tablets by mouth every 6 (six) hours as needed for moderate pain.  04/03/18  Yes Nicholes Stairs, MD  hyoscyamine (ANASPAZ) 0.125 MG TBDP disintergrating tablet Place 0.25 tablets under the tongue 3 (three) times daily with meals.    Yes [provider]  ibuprofen (ADVIL,MOTRIN) 200 MG tablet Take 800 mg by mouth every 8 (eight) hours as needed for mild pain.    Yes [provider]  immune globulin, human, (GAMMAGARD S/D LESS IGA) 10 g  injection Inject 60 g into the vein every 21 ( twenty-one) days.   Yes [provider]  Insulin Human (INSULIN PUMP) SOLN Inject into the skin.   Yes [provider]  insulin regular human CONCENTRATED (HUMULIN R) 500 UNIT/ML injection Inject 2 Units into the skin See admin instructions. 2.0 units /hour 0500-2359, 1.95units 4235-3614 with carb ratio 1:50 for meal time bolus   Yes [provider]  INVOKAMET 50-1000 MG TABS Take 1 tablet by mouth 2 (two) times daily.  10/20/14  Yes [provider]  ketoconazole (NIZORAL) 2 % cream Apply 1 application topically 2 (two) times daily as needed (for toes).  09/18/17  Yes [provider]  levothyroxine (SYNTHROID, LEVOTHROID) 137 MCG tablet Take 274 mcg by mouth daily before breakfast.   Yes [provider]  lisinopril (PRINIVIL,ZESTRIL) 40 MG tablet Take 40 mg by mouth daily.    Yes [provider]  metoprolol succinate (TOPROL-XL) 100 MG 24 hr tablet Take 50-100 mg by mouth See admin instructions. Take 100 mg by mouth in the morning and take 50 mg by mouth in the evening   Yes [provider]  metroNIDAZOLE (METROCREAM) 0.75 %  cream Apply 1 application topically 2 (two) times daily.    Yes [provider]  Multiple Vitamin (MULTIVITAMIN) tablet Take 1 tablet by mouth at bedtime.    Yes [provider]  ondansetron (ZOFRAN-ODT) 8 MG disintegrating tablet Take 8 mg by mouth See admin instructions. Take 8 mg by mouth 3 times daily before meals. Take 8 mg by mouth every 6 hours as needed for nausea and vomiting.   Yes [provider]  PROCTOSOL HC 2.5 % rectal cream Apply 1 application topically 2 (two) times daily as needed for hemorrhoids.  12/28/15  Yes [provider]  triamcinolone ointment (KENALOG) 0.1 % Apply 1 application topically 2 (two) times daily as needed (for finger blister).    Yes [provider]     Family History  Problem Relation Age of Onset  . Hypertension Mother   . Emphysema Mother   . AAA (abdominal aortic aneurysm) Mother   . Hypertension Father   . Coronary artery disease Father   . Cancer - Prostate Father   . Renal Disease Father   . Hypertension Brother   . Colon cancer Neg Hx   . Esophageal cancer Neg Hx   . Liver cancer Neg Hx   . Pancreatic cancer Neg Hx   . Rectal cancer Neg Hx   . Stomach cancer Neg Hx     Social History   Socioeconomic History  . Marital status: Married    Spouse name: Not on file  . Number of children: Not on file  . Years of education: Not on file  . Highest education level: Not on file  Occupational History  . Occupation: Programmer, multimedia: [employer]  . Financial resource strain: Not on file  . Food insecurity:    Worry: Not on file    Inability: Not on file  . Transportation needs:    Medical: Not on file    Non-medical: Not on file  Tobacco Use  . Smoking status: Never Smoker  . Smokeless tobacco: Never Used  Substance and Sexual Activity  . Alcohol use: No  . Drug use: No  . Sexual activity: Yes    Comment: partial hysterectomy  Lifestyle  . Physical activity:    Days per  week: Not  on file    Minutes per session: Not on file  . Stress: Not on file  Relationships  . Social connections:    Talks on phone: Not on file    Gets together: Not on file    Attends religious service: Not on file    Active member of club or organization: Not on file    Attends meetings of clubs or organizations: Not on file    Relationship status: Not on file  Other Topics Concern  . Not on file  Social History Narrative   Lives with husband in a one story home.  No children.  RN.  Education: college.      Review of Systems: A 12 point ROS discussed and pertinent positives are indicated in the HPI above.  All other systems are negative.  Review of Systems  Vital Signs: Pulse (!) 108   Temp 98.7 F (37.1 C)   Resp (!) 22   Ht 5\' 6"  (1.676 m)   Wt 128.4 kg   SpO2 99%   BMI 45.68 kg/m   Physical Exam  Constitutional: She is oriented to person, place, and time. She appears well-developed.  HENT:  Head: Normocephalic and atraumatic.  Eyes: EOM are normal.  Neck: Normal range of motion.  Cardiovascular: Normal rate, regular rhythm and normal heart sounds.  Pulmonary/Chest: Effort normal and breath sounds normal.  Abdominal: Soft.  Musculoskeletal: Normal range of motion.  Neurological: She is alert and oriented to person, place, and time.  Skin: Skin is warm and dry.  Psychiatric: She has a normal mood and affect. Her behavior is normal. Judgment and thought content normal.  Vitals reviewed.   Imaging: No results found.  Labs:  CBC: Recent Labs    09/26/17 0944 04/03/18 1013  WBC 5.9 9.7  HGB 13.4 11.7*  HCT 41.8 36.3  PLT 193 435*    COAGS: No results for input(s): INR, APTT in the last 8760 hours.  BMP: Recent Labs    09/26/17 0944 04/03/18 1013  NA 137 135  K 4.2 4.1  CL 100* 98*  CO2 23 24  GLUCOSE 159* 73  BUN 13 11  CALCIUM 9.4 9.0  CREATININE 0.99 0.82  GFRNONAA >60 >60  GFRAA >60 >60    LIVER FUNCTION TESTS: Recent Labs     09/26/17 0944  BILITOT 0.5  AST 105*  ALT 71*  ALKPHOS 83  PROT 6.9  ALBUMIN 4.1    TUMOR MARKERS: No results for input(s): AFPTM, CEA, CA199, CHROMGRNA in the last 8760 hours.  Assessment and Plan:  Chronic inflammatory demyelinating polyneuropathy.  Poor IV Access and need for IVIG every 3 weeks.  Will proceed with placement of a Port A Cath today.  Thank you for this interesting consult.  I greatly enjoyed meeting Doneshia M Roberts-Petion and look forward to participating in their care.  A copy of this report was sent to the requesting provider on this date.  Electronically Signed: Murrell Redden, PA-C   09/22/2018, 7:47 AM      I spent a total of  30 Minutes in face to face in clinical consultation, greater than 50% of which was counseling/coordinating care for [facility] A Cath placement

## 2018-09-22 NOTE — Procedures (Signed)
  Procedure: R IJ Port    EBL:   minimal Complications:  none immediate  See full dictation in [place] PACS.  D. Kemyah Buser MD Main # [phone] Pager  [phone]    

## 2018-09-23 DIAGNOSIS — E119 Type 2 diabetes mellitus without complications: Secondary | ICD-10-CM | POA: Diagnosis not present

## 2018-09-25 ENCOUNTER — Telehealth: Payer: Self-pay | Admitting: Student

## 2018-09-25 NOTE — Telephone Encounter (Signed)
Patient called to report that she has had neck pain since procedure 09/22/18. States she has muscle pain and neuropathy related to her CIPD.  Her access site is sore and tender. No erythema or warmth to site.  States her pain is more muscular in nature, but wanted reassurance this was a typical healing process.  Reviewed symptoms and expected course of healing with patient.  Encouraged to use ice as needed.  She may take Tylenol or Ibuprofen for muscle pain relief if needed.  Patient is not planning to use Port until 10/24.  She denies fever, chills, shortness of breath, bleeding, oozing, or pus. She did replace the dressing this AM and denies any blood or pus to bandage.  She has replaced gauze and will try interventions suggested. She knows she can call us back if her symptoms do not improve in a few days.   Brynda Greathouse, MS RD PA-C 4:35 PM

## 2018-10-02 DIAGNOSIS — G6181 Chronic inflammatory demyelinating polyneuritis: Secondary | ICD-10-CM | POA: Diagnosis not present

## 2018-10-07 DIAGNOSIS — M65342 Trigger finger, left ring finger: Secondary | ICD-10-CM | POA: Diagnosis not present

## 2018-10-07 DIAGNOSIS — L301 Dyshidrosis [pompholyx]: Secondary | ICD-10-CM | POA: Diagnosis not present

## 2018-10-07 DIAGNOSIS — E1129 Type 2 diabetes mellitus with other diabetic kidney complication: Secondary | ICD-10-CM | POA: Diagnosis not present

## 2018-10-07 DIAGNOSIS — I10 Essential (primary) hypertension: Secondary | ICD-10-CM | POA: Diagnosis not present

## 2018-10-07 DIAGNOSIS — E11621 Type 2 diabetes mellitus with foot ulcer: Secondary | ICD-10-CM | POA: Diagnosis not present

## 2018-10-23 DIAGNOSIS — G6181 Chronic inflammatory demyelinating polyneuritis: Secondary | ICD-10-CM | POA: Diagnosis not present

## 2018-10-24 ENCOUNTER — Other Ambulatory Visit: Payer: Self-pay | Admitting: Gastroenterology

## 2018-11-11 DIAGNOSIS — E11621 Type 2 diabetes mellitus with foot ulcer: Secondary | ICD-10-CM | POA: Diagnosis not present

## 2018-11-11 DIAGNOSIS — R0781 Pleurodynia: Secondary | ICD-10-CM | POA: Diagnosis not present

## 2018-11-13 DIAGNOSIS — G6181 Chronic inflammatory demyelinating polyneuritis: Secondary | ICD-10-CM | POA: Diagnosis not present

## 2018-11-14 DIAGNOSIS — G6181 Chronic inflammatory demyelinating polyneuritis: Secondary | ICD-10-CM | POA: Diagnosis not present

## 2018-11-18 DIAGNOSIS — I10 Essential (primary) hypertension: Secondary | ICD-10-CM | POA: Diagnosis not present

## 2018-11-18 DIAGNOSIS — Z4681 Encounter for fitting and adjustment of insulin pump: Secondary | ICD-10-CM | POA: Diagnosis not present

## 2018-11-18 DIAGNOSIS — Z794 Long term (current) use of insulin: Secondary | ICD-10-CM | POA: Diagnosis not present

## 2018-11-18 DIAGNOSIS — E11621 Type 2 diabetes mellitus with foot ulcer: Secondary | ICD-10-CM | POA: Diagnosis not present

## 2018-11-18 DIAGNOSIS — E1129 Type 2 diabetes mellitus with other diabetic kidney complication: Secondary | ICD-10-CM | POA: Diagnosis not present

## 2018-12-01 DIAGNOSIS — G6181 Chronic inflammatory demyelinating polyneuritis: Secondary | ICD-10-CM | POA: Diagnosis not present

## 2018-12-05 ENCOUNTER — Telehealth: Payer: Self-pay | Admitting: *Deleted

## 2018-12-05 NOTE — Telephone Encounter (Signed)
IMJ IMMUNE GLOBULIN IV 500 mg.   Authorized 12/14/2018 through 12/14/2019.  Procedure code: L8937  Reference #: [id number]

## 2018-12-14 ENCOUNTER — Other Ambulatory Visit: Payer: Self-pay | Admitting: Gastroenterology

## 2018-12-16 ENCOUNTER — Encounter: Payer: Self-pay | Admitting: Internal Medicine

## 2018-12-16 ENCOUNTER — Ambulatory Visit (INDEPENDENT_AMBULATORY_CARE_PROVIDER_SITE_OTHER): Payer: BLUE CROSS/BLUE SHIELD | Admitting: Internal Medicine

## 2018-12-16 VITALS — BP 119/79 | HR 95 | Ht 66.0 in | Wt 279.0 lb

## 2018-12-16 DIAGNOSIS — R002 Palpitations: Secondary | ICD-10-CM

## 2018-12-16 DIAGNOSIS — R55 Syncope and collapse: Secondary | ICD-10-CM | POA: Diagnosis not present

## 2018-12-16 DIAGNOSIS — I951 Orthostatic hypotension: Secondary | ICD-10-CM

## 2018-12-16 NOTE — Patient Instructions (Signed)

## 2018-12-16 NOTE — Progress Notes (Signed)
Patient Care Team: Reynold Bowen, MD as PCP - General (Endocrinology)   HPI  [name] is a 53 y.o. female Seen in follow-up for recurrent syncope with hypertension, documented orthostatic hypotension in the context of diabetes with likely some degree of neuropathy;  intercurrently diagnosed with CIDP   We hve recommended compressive clothing as her systolic hypertension precludes sodium loading. Long-term use of beta blockers have been helpful with exercise intolerance    RX abdominal binder w improvement.  But now she using a compressive camisole.  She was getting skin erosion from the abdominal binder.  She is continuing to struggle with her disability. She has had recurrent falls x 4, tripping once bend and falling, standing and collapsing  Takes diuretics q3d for dypsnea and edema     Records and Results Reviewed   Past Medical History:  Diagnosis Date  . Anginal pain ([place])    chest pain  heart cath done  . Anxiety   . Cancer [facility])    thyroid cancer  . Chronic bronchitis ([place])    "get it q yr"  . Chronic inflammatory demyelinating polyneuropathy ([place])   . Chronic kidney disease    Proteinuria  . Dyspnea    sob with exertion  . Early cataract    right  . Fibromyalgia   . GERD (gastroesophageal reflux disease)   . Heart murmur   . Hepatic adenoma   . High cholesterol   . History of gout   . History of hiatal hernia   . History of kidney stones   . Hypertension   . Hypothyroidism   . IBS (irritable bowel syndrome)   . Iron deficiency anemia    "comes and goes"  . Migraine    "@ least once/month" (02/18/2015)  . Obesity   . PONV (postoperative nausea and vomiting)    patch and anti nausea meds work well  . Proteinuria   . Spinal headache   . Syncope   . Tachycardia   . Type II diabetes mellitus ([place])    pt has an insulin pump  . Wears glasses     Past Surgical History:  Procedure Laterality Date  . ABDOMINAL HYSTERECTOMY  ~2012    "lap"  . ANKLE RECONSTRUCTION Left 03/29/2016   Procedure: LEFT ANKLE LATERAL LIGAMENT RECONSTRUCTION AND PERONEAL TENDON REPAIR OR TENOLYSIS;  Surgeon: Wylene Simmer, MD;  Location: [place];  Service: Orthopedics;  Laterality: Left;  . BACK SURGERY    . CARDIAC CATHETERIZATION  04/2009   "clean"  . COLONOSCOPY  01/12/2015   Colonic polyps status post polypectomy, small internal hemorrhoids. Otherwise normal. Bx: mild chronic gastritis.  . CYSTOSCOPY WITH URETEROSCOPY, STONE BASKETRY AND STENT PLACEMENT  ~ 2006  . ESOPHAGOGASTRODUODENOSCOPY  07/21/2015   Esophageal stricture status post esophageal dilatation. Mild gastritis.  Marland Kitchen insulin pump     U 500  . IR IMAGING GUIDED PORT INSERTION  09/22/2018  . IRRIGATION AND DEBRIDEMENT KNEE Left 04/03/2018   Procedure: Arthroscopic versus open irrigation and debridement  left knee;  Surgeon: Nicholes Stairs, MD;  Location: [place];  Service: Orthopedics;  Laterality: Left;  . LAPAROSCOPIC CHOLECYSTECTOMY  05/2004  . LUMBAR DISC SURGERY  08/2011  . SURAL NERVE BX Left 09/30/2017   Procedure: LEFT SURAL NERVE AND MUSCLE BIOPSY;  Surgeon: Newman Pies, MD;  Location: [place];  Service: Neurosurgery;  Laterality: Left;  LEFT SURAL NERVE AND MUSCLE BIOPSY  . THYROIDECTOMY N/A 02/18/2015   Procedure: TOTAL  THYROIDECTOMY;  Surgeon: [name], MD;  Location: [place];  Service: General;  Laterality: N/A;  . TOTAL THYROIDECTOMY  02/18/2015    Current Outpatient Medications  Medication Sig Dispense Refill  . acetaminophen (TYLENOL) 500 MG tablet Take 1,000 mg by mouth every 6 (six) hours as needed for moderate pain or headache.     . ALPRAZolam (XANAX) 0.5 MG tablet Take 0.5 mg by mouth every 6 (six) hours as needed for anxiety or sleep.     Marland Kitchen ammonium lactate (LAC-HYDRIN) 12 % lotion Apply 1 application topically 2 (two) times daily.     Marland Kitchen aspirin EC 81 MG tablet Take 81 mg by mouth daily.    Marland Kitchen atorvastatin (LIPITOR) 20 MG tablet Take 20 mg by mouth daily  at 6 PM.     . b complex vitamins tablet Take 1 tablet by mouth daily.    . Biotin 10000 MCG TABS Take 10,000 mcg by mouth at bedtime.    . clobetasol cream (TEMOVATE) 0.93 % Apply 1 application topically 2 (two) times daily.    . clotrimazole-betamethasone (LOTRISONE) lotion Apply 1 application topically 2 (two) times daily as needed (for irritation).   2  . cyclobenzaprine (FLEXERIL) 10 MG tablet Take 10 mg by mouth See admin instructions. Take 10 mg by mouth at bedtime. Take 10 mg by mouth every 8 hours as needed for muscle spasms    . D3-50 50000 units capsule Take 50,000 Units by mouth every Saturday.  4  . Dermatological Products, [place]isc. (HYLATOPIC PLUS) CREA Apply 1 application topically 3 (three) times daily.     Marland Kitchen dexlansoprazole (DEXILANT) 60 MG capsule Take 60 mg by mouth daily at 6 (six) AM.     . dicyclomine (BENTYL) 10 MG capsule TAKE 1 CAPSULE BY MOUTH 4 TIMES DAILY BEFORE MEAL(S) AND AT BEDTIME 90 capsule 0  . doxycycline (PERIOSTAT) 20 MG tablet Take 20 mg by mouth 2 (two) times daily.   3  . DULoxetine (CYMBALTA) 30 MG capsule Take 30 mg by mouth daily.    . fluticasone (FLONASE) 50 MCG/ACT nasal spray Place 1 spray into both nostrils at bedtime.     . furosemide (LASIX) 40 MG tablet Take 40 mg by mouth daily.     Marland Kitchen gabapentin (NEURONTIN) 100 MG capsule Take 1 capsule (100 mg total) by mouth 3 (three) times daily. 360 capsule 2  . gabapentin (NEURONTIN) 300 MG capsule TAKE 2 CAPSULES BY MOUTH THREE TIMES DAILY 540 capsule 3  . halobetasol (ULTRAVATE) 0.05 % cream Apply 1 application topically 3 (three) times daily as needed (for finger blistering).     Marland Kitchen HYDROcodone-acetaminophen (NORCO) 7.5-325 MG tablet Take 1-2 tablets by mouth every 6 (six) hours as needed for moderate pain (for pain.). 40 tablet 0  . hyoscyamine (ANASPAZ) 0.125 MG TBDP disintergrating tablet Place 0.25 tablets under the tongue 3 (three) times daily with meals.     Marland Kitchen ibuprofen (ADVIL,MOTRIN) 200 MG tablet Take  800 mg by mouth every 8 (eight) hours as needed for mild pain.     Marland Kitchen immune globulin, human, (GAMMAGARD S/D LESS IGA) 10 g injection Inject 60 g into the vein every 21 ( twenty-one) days.    . Insulin Human (INSULIN PUMP) SOLN Inject into the skin.    Marland Kitchen insulin regular human CONCENTRATED (HUMULIN R) 500 UNIT/ML injection Inject 2 Units into the skin See admin instructions. 2.0 units /hour 0500-2359, 1.95units 2671-2458 with carb ratio 1:50 for meal time bolus    .  INVOKAMET 50-1000 MG TABS Take 1 tablet by mouth 2 (two) times daily.   5  . ketoconazole (NIZORAL) 2 % cream Apply 1 application topically 2 (two) times daily as needed (for toes).     Marland Kitchen levothyroxine (SYNTHROID, LEVOTHROID) 137 MCG tablet Take 274 mcg by mouth daily before breakfast.    . lisinopril (PRINIVIL,ZESTRIL) 40 MG tablet Take 40 mg by mouth daily.     . metoprolol succinate (TOPROL-XL) 100 MG 24 hr tablet Take 50-100 mg by mouth See admin instructions. Take 100 mg by mouth in the morning and take 50 mg by mouth in the evening    . metroNIDAZOLE (METROCREAM) 0.75 % cream Apply 1 application topically 2 (two) times daily.     . Multiple Vitamin (MULTIVITAMIN) tablet Take 1 tablet by mouth at bedtime.     . ondansetron (ZOFRAN-ODT) 8 MG disintegrating tablet Take 8 mg by mouth See admin instructions. Take 8 mg by mouth 3 times daily before meals. Take 8 mg by mouth every 6 hours as needed for nausea and vomiting.    Marland Kitchen PROCTOSOL HC 2.5 % rectal cream Apply 1 application topically 2 (two) times daily as needed for hemorrhoids.   2  . triamcinolone ointment (KENALOG) 0.1 % Apply 1 application topically 2 (two) times daily as needed (for finger blister).      No current facility-administered medications for this visit.     Allergies  Allergen Reactions  . Penicillins Anaphylaxis, Shortness Of Breath, Swelling, Palpitations and Other (See Comments)    Has patient had a PCN reaction causing immediate rash, facial/tongue/throat  swelling, SOB or lightheadedness with hypotension: Yes Has patient had a PCN reaction causing severe rash involving mucus membranes or skin necrosis: No Has patient had a PCN reaction that required hospitalization No Has patient had a PCN reaction occurring within the last 10 years: No If all of the above answers are "NO", then may proceed with Cephalosporin use.   . Clarithromycin Nausea And Vomiting and Other (See Comments)    Any antibiotic (doxyclycine, etc)  . Other Other (See Comments)    All antibiotics makes patient sick  . Adhesive [Tape] Itching and Other (See Comments)    Redness  . Latex Itching, Rash and Other (See Comments)    Blisters with prolonged contact  . Moxifloxacin Nausea And Vomiting and Other (See Comments)    Needs Zofran or Phenergan      Review of Systems negative except from HPI and PMH  Physical Exam BP 119/79   Pulse 95   Ht 5\' 6"  (1.676 m)   Wt 279 lb (126.6 kg)   SpO2 96%   BMI 45.03 kg/m   Well developed and Morbidly obese  in no acute distress HENT normal Neck supple with JVP-flat Clear Regular rate and rhythm, no murmurs or gallops Abd-soft with active BS No Clubbing cyanosis edema Skin-warm and dry A & Oriented  Grossly normal sensory and motor function Tearful at times    Assessment and  Plan   Orthostatic hypotension  Syncope  Diabetes  Morbid obesity  CIDP ( chronic inflammatory demyelinating polyneuropathy)  Hypertension  BP relatively low, will decrease lisinopril 40--20.  Adjustments in blood pressure previously were significantly improving  Tearful and struggling with her idagnosi  Suggested isometric contractions;      Current medicines are reviewed at length with the patient today .  The patient does not  have concerns regarding medicines.

## 2018-12-21 NOTE — Progress Notes (Signed)
Follow-up Visit   Date: 12/22/18    [name] MRN: [id number] DOB: 06-10-1966   Interim History: [name] is a 53 y.o. right-handed Caucasian female with insulin-dependent diabetes mellitus, thyroid cancer s/p resection secondary hypothyroidism (2016), hypertension, hyperlipidemia, GERD, s/p left L5 hemilaminectomy and discectomy, dysautonomia, chronic left ankle pain returning for a follow-up of chronic inflammatory demyelinating polyradiculoneuropathy.  History of present illness: She was diagnosed with CIDP in the fall of 2018 after left sural nerve biopsy showed demyelinating and axon loss findings.  Several years preceding this, she was having painful paresthesias and diffuse weakness of the feet which extended in the legs and arms.  She also developed dysautonomic symptoms of bladder incontinence, orthostatic hypotension, and syncope. She was frequently falling and started using a rollator.  She was started on IVIG in February 2019 and has been experiencing problems with IV venous access therefore had port-a-cath placed in October 2019.  By the fall of 2019, she was appreciating improved proximal arm strength and ability to grasp objects.  Tingling has improved, however the numbness remains unchanged.  She uses a power chair as needed for long distances, otherwise uses a walker at home.     UPDATE 12/21/2018:  She is here for follow-up visit.  She continues to receive IVIG every 3 weeks and reports that she is scheduled to have it this week.  She was getting IVIG under hardship program for 2019 and is concerned that she may not have financial support for 2020.  She has been able to walk better with a walker and has not had any improvement with her hand strength.  He has suffered 4 falls, but has been able to stand up without assistance.   Medications:  Current Outpatient Medications on File Prior to Visit  Medication Sig Dispense Refill  . acetaminophen  (TYLENOL) 500 MG tablet Take 1,000 mg by mouth every 6 (six) hours as needed for moderate pain or headache.     . ALPRAZolam (XANAX) 0.5 MG tablet Take 0.5 mg by mouth every 6 (six) hours as needed for anxiety or sleep.     Marland Kitchen ammonium lactate (LAC-HYDRIN) 12 % lotion Apply 1 application topically 2 (two) times daily.     Marland Kitchen aspirin EC 81 MG tablet Take 81 mg by mouth daily.    Marland Kitchen atorvastatin (LIPITOR) 20 MG tablet Take 20 mg by mouth daily at 6 PM.     . b complex vitamins tablet Take 1 tablet by mouth daily.    . Biotin 10000 MCG TABS Take 10,000 mcg by mouth at bedtime.    . clobetasol cream (TEMOVATE) 0.03 % Apply 1 application topically 2 (two) times daily.    . clotrimazole-betamethasone (LOTRISONE) lotion Apply 1 application topically 2 (two) times daily as needed (for irritation).   2  . cyclobenzaprine (FLEXERIL) 10 MG tablet Take 10 mg by mouth See admin instructions. Take 10 mg by mouth at bedtime. Take 10 mg by mouth every 8 hours as needed for muscle spasms    . D3-50 50000 units capsule Take 50,000 Units by mouth every Saturday.  4  . Dermatological Products, Misc. (HYLATOPIC PLUS) CREA Apply 1 application topically 3 (three) times daily.     Marland Kitchen dexlansoprazole (DEXILANT) 60 MG capsule Take 60 mg by mouth daily at 6 (six) AM.     . dicyclomine (BENTYL) 10 MG capsule TAKE 1 CAPSULE BY MOUTH 4 TIMES DAILY BEFORE MEAL(S) AND AT BEDTIME 90 capsule 0  .  doxycycline (PERIOSTAT) 20 MG tablet Take 20 mg by mouth 2 (two) times daily.   3  . DULoxetine (CYMBALTA) 30 MG capsule Take 30 mg by mouth daily.    . fluticasone (FLONASE) 50 MCG/ACT nasal spray Place 1 spray into both nostrils at bedtime.     . furosemide (LASIX) 40 MG tablet Take 40 mg by mouth every 3 (three) days.     Marland Kitchen gabapentin (NEURONTIN) 100 MG capsule Take 1 capsule (100 mg total) by mouth 3 (three) times daily. 360 capsule 2  . gabapentin (NEURONTIN) 300 MG capsule TAKE 2 CAPSULES BY MOUTH THREE TIMES DAILY 540 capsule 3  .  halobetasol (ULTRAVATE) 0.05 % cream Apply 1 application topically 3 (three) times daily as needed (for finger blistering).     Marland Kitchen HYDROcodone-acetaminophen (NORCO) 7.5-325 MG tablet Take 1-2 tablets by mouth every 6 (six) hours as needed for moderate pain (for pain.). 40 tablet 0  . hyoscyamine (ANASPAZ) 0.125 MG TBDP disintergrating tablet Place 0.25 tablets under the tongue 3 (three) times daily with meals.     Marland Kitchen ibuprofen (ADVIL,MOTRIN) 200 MG tablet Take 800 mg by mouth every 8 (eight) hours as needed for mild pain.     Marland Kitchen immune globulin, human, (GAMMAGARD S/D LESS IGA) 10 g injection Inject 60 g into the vein every 21 ( twenty-one) days.    . Insulin Human (INSULIN PUMP) SOLN Inject into the skin.    Marland Kitchen insulin regular human CONCENTRATED (HUMULIN R) 500 UNIT/ML injection Inject 2 Units into the skin See admin instructions. 2.0 units /hour 0500-2359, 1.95units 0017-4944 with carb ratio 1:50 for meal time bolus    . INVOKAMET 50-1000 MG TABS Take 1 tablet by mouth 2 (two) times daily.   5  . ketoconazole (NIZORAL) 2 % cream Apply 1 application topically 2 (two) times daily as needed (for toes).     Marland Kitchen levothyroxine (SYNTHROID, LEVOTHROID) 137 MCG tablet Take 274 mcg by mouth daily before breakfast.    . lisinopril (PRINIVIL,ZESTRIL) 20 MG tablet Take 20 mg by mouth daily.    . metoprolol succinate (TOPROL-XL) 100 MG 24 hr tablet Take 50-100 mg by mouth See admin instructions. Take 100 mg by mouth in the morning and take 50 mg by mouth in the evening    . metroNIDAZOLE (METROCREAM) 0.75 % cream Apply 1 application topically 2 (two) times daily.     . Multiple Vitamin (MULTIVITAMIN) tablet Take 1 tablet by mouth at bedtime.     . ondansetron (ZOFRAN-ODT) 8 MG disintegrating tablet Take 8 mg by mouth See admin instructions. Take 8 mg by mouth 3 times daily before meals. Take 8 mg by mouth every 6 hours as needed for nausea and vomiting.    Marland Kitchen PROCTOSOL HC 2.5 % rectal cream Apply 1 application topically  2 (two) times daily as needed for hemorrhoids.   2  . triamcinolone ointment (KENALOG) 0.1 % Apply 1 application topically 2 (two) times daily as needed (for finger blister).     . vitamin B-12 (CYANOCOBALAMIN) 1000 MCG tablet Take 1,000 mcg by mouth daily.     No current facility-administered medications on file prior to visit.     Allergies:  Allergies  Allergen Reactions  . Penicillins Anaphylaxis, Shortness Of Breath, Swelling, Palpitations and Other (See Comments)    Has patient had a PCN reaction causing immediate rash, facial/tongue/throat swelling, SOB or lightheadedness with hypotension: Yes Has patient had a PCN reaction causing severe rash involving mucus membranes or skin necrosis:  No Has patient had a PCN reaction that required hospitalization No Has patient had a PCN reaction occurring within the last 10 years: No If all of the above answers are "NO", then may proceed with Cephalosporin use.   . Clarithromycin Nausea And Vomiting and Other (See Comments)    Any antibiotic (doxyclycine, etc)  . Other Other (See Comments)    All antibiotics makes patient sick  . Adhesive [Tape] Itching and Other (See Comments)    Redness  . Latex Itching, Rash and Other (See Comments)    Blisters with prolonged contact  . Moxifloxacin Nausea And Vomiting and Other (See Comments)    Needs Zofran or Phenergan    Review of Systems:  CONSTITUTIONAL: No fevers, chills, night sweats, or weight loss.  EYES: No visual changes or eye pain ENT: No hearing changes.  No history of nose bleeds.   RESPIRATORY: No cough, wheezing and shortness of breath.   CARDIOVASCULAR: Negative for chest pain, and palpitations.   GI: +for abdominal discomfort, blood in stools or black stools.  No recent change in bowel habits.   GU:  +history of incontinence.   MUSCLOSKELETAL: No history of joint pain or swelling.  +myalgias.  +weakness SKIN: Negative for lesions, rash, and itching.   ENDOCRINE: Negative for  cold or heat intolerance, polydipsia or goiter.   PSYCH:  No  depression +anxiety symptoms.   NEURO: As Above.   Vital Signs:  BP 120/66   Pulse 86   Ht '5\' 6"'  (1.676 m)   Wt 284 lb (128.8 kg)   SpO2 96%   BMI 45.84 kg/m   General Medical Exam:   General:  Well appearing, comfortable  Eyes/ENT: see cranial nerve examination.   Neck:   Full range of motion without tenderness.  No carotid bruits. Respiratory:  Clear to auscultation, good air entry bilaterally.   Cardiac:  Regular rate and rhythm, no murmur.   Ext:  No edema  Neurological Exam: MENTAL STATUS including orientation to time, place, person, recent and remote memory, attention span and concentration, language, and fund of knowledge is normal.  Speech is not dysarthric.  CRANIAL NERVES:  Pupils are round and reactive to light.  Extraocular muscles are intact.  Face is symmetric.  Palate elevates symmetrically.   MOTOR: Tone is normal.  There is some atrophy of the intrinsic hand muscles.    Right Upper Extremity:    Left Upper Extremity:    Deltoid  5/5   Deltoid  5/5   Biceps  5-/5   Biceps  5-/5   Triceps  5/5   Triceps  5/5   Wrist extensors  5/5   Wrist extensors  5/5   Wrist flexors  4+/5   Wrist flexors  4+/5   Finger extensors  4/5   Finger extensors  4/5   Finger flexors  4-/5   Finger flexors  4/5   Dorsal interossei  4/5   Dorsal interossei  4/5   Abductor pollicis  4/5   Abductor pollicis  4/5   Tone (Ashworth scale)  0  Tone (Ashworth scale)  0   Right Lower Extremity:    Left Lower Extremity:    Hip flexors  5/5   Hip flexors  5-/5   Hip extensors  5/5   Hip extensors  5/5   Knee flexors  5/5   Knee flexors  5-/5   Knee extensors         5/5 Inversion 5/5  inversion 2/5  Eversion 5/5  Eversion 2/5  Dorsiflexors  4/5   Dorsiflexors  4/5   Plantarflexors  4/5   Plantarflexors  4/5   Toe extensors  4-/5   Toe extensors  1/5   Toe flexors  4-/5   Toe flexors  1/5   Tone  (Ashworth scale)  0  Tone (Ashworth scale)  0    MSRs:    Right                                                                 Left brachioradialis 2+  brachioradialis 2+  biceps 2+  biceps 2+  triceps 2+  triceps 2+  patellar 3+  patellar 3+  ankle jerk 0  ankle jerk 0   SENSORY:  Vibration 50% at the knees bilaterally, absent at the ankles; vibration is absent at the MCP bilaterally.    COORDINATION/GAIT:  Gait appears stable, assisted with walker.   Data: NCS/EMG of the arms 01/29/2017: The electrophysiologic testing is most consistent with an active on chronic, distal and symmetric sensorimotor polyneuropathy, demyelinating and axon loss in type. Overall, these findings are severe in degree electrically.   In addition, the presence of temporal dispersion suggests an acquired condition.  NCS/EMG of the legs 01/10/2017:    The electrophysiologic findings are most consistent with a subacute sensorimotor polyneuropathy, axon loss in type, affecting the lower extremities and worse on the left. Overall, these findings are severe in degree electrically.  Labs 01/25/2017:  ESR 10, CRP 0.3, vitamin B12 256, ACE 5, MMA 231, ANA neg, SSA/B neg, copper 92, vitamin B1 6*, SPEP with IFE no M protein  CSF 02/19/2017:  R1200, W0 G76 P37  ACE 10, IgG index 0.47, Lyme neg, cytology neg, no OCB  MRI lumbar spine wwo contrast 02/28/2017: 1. Stable appearance of the lumbar spine. No acute abnormality identified. 2. Postoperative changes on the left L5-S1 without residual stenosis. 3. Small left subarticular disc protrusion at L3-4 encroaching upon the left lateral recess, and potentially affecting the descending left L4 nerve root. 4. Mild bilateral foraminal stenosis at L4-5 related to disc bulge and facet degeneration.  MRI cervical spine wwo contrast 09/09/2017: 1. Normal MRI appearance of the cervical spinal cord. No cord signal abnormality is identified. 2. Multilobulated disc protrusion at  C4-5, flattening the right hemi cord without cord signal changes. 3. Broad posterior disc bulge at C6-7 with resultant mild canal and left C7 foraminal stenosis. 4. Central disc protrusion at C3-4 with resultant mild spinal stenosis. No significant cord deformity.  MRI brain wwo contrast 09/09/2017:  Normal MRI of the brain. No acute intracranial abnormality identified.  Left sural nerve biopsy performed on 10/22 shows chronic (predominant) and acute loss of large myelinated fibers with possible demyelination/remyelination process consistent with prior diagnosis of chronic inflammatory demyelinating poly-radicular neuropathy; marked thickening of the endoneutrial vessels consistent with diabetic neuropathy, clinical correlation recommended.   Left vastus lateralis muscle biopsy: 2 isolated foci of very mild perivascular, possibly vascular, inflammation, and mild nonspecific mild myofiber atrophy and skeletal muscle example without myofiber necrosis.   IMPRESSION/PLAN: 1.  Chronic inflammatory polyradiculoneuropathy with distal weakness, sensory loss (worse on the left), and dysautonomia.  Diagnosed by sural nerve biopsy (09/2017); CSF testing was normal, however NCS/EMG was highly  suggestive of a subacute polyneuropathy.   She started IVIG in February 2019 and has been continued on IVIG 1g/kg every 3 weeks via port-a-cath.  She has been tolerating infusions well. She has responded to treatment with IVIG and shows improved proximal strength, but continues to have distal weakness and paresthesias of the hands and feet. Continue IVIG 1/mg every 21 days - we will contact [employer] to see if there is financial assistance available to her For her painful paresthesias, continue 628m TID.  Start out-patient physical and occupational therapy She was strongly encouraged to start an exercise program, both from a neuropathy stand point as well as weight-loss.  She has gained significant amount of weight over the  past few years.   2.  Chronic low back pain.  Continue flexeril 58mat bedtime.  Return to clinic in 6 months  Greater than 50% of this 30 minute visit was spent in counseling, explanation of diagnosis, planning of further management, and coordination of care.      Thank you for allowing me to participate in patient's care.  If I can answer any additional questions, I would be pleased to do so.    Sincerely,    Tabatha Razzano K. PaPosey ProntoDO

## 2018-12-22 ENCOUNTER — Encounter: Payer: Self-pay | Admitting: Neurology

## 2018-12-22 ENCOUNTER — Ambulatory Visit (INDEPENDENT_AMBULATORY_CARE_PROVIDER_SITE_OTHER): Payer: BLUE CROSS/BLUE SHIELD | Admitting: Neurology

## 2018-12-22 VITALS — BP 120/66 | HR 86 | Ht 66.0 in | Wt 284.0 lb

## 2018-12-22 DIAGNOSIS — G6181 Chronic inflammatory demyelinating polyneuritis: Secondary | ICD-10-CM

## 2018-12-22 DIAGNOSIS — G8314 Monoplegia of lower limb affecting left nondominant side: Secondary | ICD-10-CM

## 2018-12-22 DIAGNOSIS — R29898 Other symptoms and signs involving the musculoskeletal system: Secondary | ICD-10-CM | POA: Diagnosis not present

## 2018-12-22 NOTE — Addendum Note (Signed)
Addended by: Chester Holstein on: 12/22/2018 08:56 AM   Modules accepted: Orders

## 2018-12-22 NOTE — Patient Instructions (Signed)
Referral to physical and occupational therapy  Continue IVIG every 21 days  Return to clinic in 6 months

## 2018-12-24 DIAGNOSIS — G6181 Chronic inflammatory demyelinating polyneuritis: Secondary | ICD-10-CM | POA: Diagnosis not present

## 2018-12-25 DIAGNOSIS — G6181 Chronic inflammatory demyelinating polyneuritis: Secondary | ICD-10-CM | POA: Diagnosis not present

## 2019-01-05 DIAGNOSIS — R2689 Other abnormalities of gait and mobility: Secondary | ICD-10-CM | POA: Diagnosis not present

## 2019-01-05 DIAGNOSIS — M6281 Muscle weakness (generalized): Secondary | ICD-10-CM | POA: Diagnosis not present

## 2019-01-05 DIAGNOSIS — R2681 Unsteadiness on feet: Secondary | ICD-10-CM | POA: Diagnosis not present

## 2019-01-05 DIAGNOSIS — G6181 Chronic inflammatory demyelinating polyneuritis: Secondary | ICD-10-CM | POA: Diagnosis not present

## 2019-01-12 DIAGNOSIS — R2681 Unsteadiness on feet: Secondary | ICD-10-CM | POA: Diagnosis not present

## 2019-01-12 DIAGNOSIS — Z736 Limitation of activities due to disability: Secondary | ICD-10-CM | POA: Diagnosis not present

## 2019-01-12 DIAGNOSIS — R2689 Other abnormalities of gait and mobility: Secondary | ICD-10-CM | POA: Diagnosis not present

## 2019-01-12 DIAGNOSIS — M6281 Muscle weakness (generalized): Secondary | ICD-10-CM | POA: Diagnosis not present

## 2019-01-12 DIAGNOSIS — G6181 Chronic inflammatory demyelinating polyneuritis: Secondary | ICD-10-CM | POA: Diagnosis not present

## 2019-01-15 DIAGNOSIS — G6181 Chronic inflammatory demyelinating polyneuritis: Secondary | ICD-10-CM | POA: Diagnosis not present

## 2019-01-19 DIAGNOSIS — R2681 Unsteadiness on feet: Secondary | ICD-10-CM | POA: Diagnosis not present

## 2019-01-19 DIAGNOSIS — M6281 Muscle weakness (generalized): Secondary | ICD-10-CM | POA: Diagnosis not present

## 2019-01-19 DIAGNOSIS — Z736 Limitation of activities due to disability: Secondary | ICD-10-CM | POA: Diagnosis not present

## 2019-01-19 DIAGNOSIS — R2689 Other abnormalities of gait and mobility: Secondary | ICD-10-CM | POA: Diagnosis not present

## 2019-01-19 DIAGNOSIS — G6181 Chronic inflammatory demyelinating polyneuritis: Secondary | ICD-10-CM | POA: Diagnosis not present

## 2019-01-26 DIAGNOSIS — G6181 Chronic inflammatory demyelinating polyneuritis: Secondary | ICD-10-CM | POA: Diagnosis not present

## 2019-01-26 DIAGNOSIS — R2689 Other abnormalities of gait and mobility: Secondary | ICD-10-CM | POA: Diagnosis not present

## 2019-01-26 DIAGNOSIS — M6281 Muscle weakness (generalized): Secondary | ICD-10-CM | POA: Diagnosis not present

## 2019-01-26 DIAGNOSIS — Z736 Limitation of activities due to disability: Secondary | ICD-10-CM | POA: Diagnosis not present

## 2019-01-26 DIAGNOSIS — R2681 Unsteadiness on feet: Secondary | ICD-10-CM | POA: Diagnosis not present

## 2019-01-29 DIAGNOSIS — S8262XA Displaced fracture of lateral malleolus of left fibula, initial encounter for closed fracture: Secondary | ICD-10-CM | POA: Diagnosis not present

## 2019-01-29 DIAGNOSIS — M25572 Pain in left ankle and joints of left foot: Secondary | ICD-10-CM | POA: Diagnosis not present

## 2019-01-30 ENCOUNTER — Telehealth: Payer: Self-pay

## 2019-01-30 NOTE — Telephone Encounter (Signed)
   [facility] Pre-operative Risk Assessment    Request for surgical clearance:  1. What type of surgery is being performed? ORIF left ankle fracture   2. When is this surgery scheduled? TBD   3. What type of clearance is required (medical clearance vs. Pharmacy clearance to hold med vs. Both)? Medical  4. Are there any medications that need to be held prior to surgery and how long? None   5. Practice name and name of physician performing surgery? [facility]/ Dr. Wylene Simmer   6. What is your office phone number [phone]    7.   What is your office fax number [phone]  8.   Anesthesia type (None, local, MAC, general) ? General   Mady Haagensen 01/30/2019, 5:04 PM  _________________________________________________________________   (provider comments below)

## 2019-02-02 NOTE — Telephone Encounter (Signed)
   Primary Cardiologist: Dr. Jolyn Nap for electrophysiology  Chart reviewed as part of pre-operative protocol coverage. Patient was contacted 02/02/2019 in reference to pre-operative risk assessment for pending surgery as outlined below.  [name] was last seen on 12/16/2018 by Dr. Caryl Comes.  Since that day, [name] has done well. She was referred to Dr. Caryl Comes for orthostatic hypotension/syncope and evaluation for POTS which Dr. Caryl Comes does not feel is the case. She has a normal echo and cardiac monitor with no arrhythmias. Dr. Caryl Comes feels her symptoms are more related to autonomic insufficiency and he has her using compression garments. Per phone call today, pt feels that her symptoms are fairly well controlled. She reports that she had knee surgery last 03/2018 and EGD/colonoscopy in 06/2018 that she did well with. She has had no chest pain or shortness of breath.  Therefore, based on ACC/AHA guidelines, the patient would be at acceptable risk for the planned procedure without further cardiovascular testing.   I will route this recommendation to the requesting party via Epic fax function and remove from pre-op pool.  Please call with questions.  Daune Perch, NP 02/02/2019, 2:01 PM

## 2019-02-03 DIAGNOSIS — E1165 Type 2 diabetes mellitus with hyperglycemia: Secondary | ICD-10-CM | POA: Diagnosis not present

## 2019-02-05 DIAGNOSIS — G6181 Chronic inflammatory demyelinating polyneuritis: Secondary | ICD-10-CM | POA: Diagnosis not present

## 2019-02-06 ENCOUNTER — Other Ambulatory Visit: Payer: Self-pay | Admitting: Gastroenterology

## 2019-02-06 DIAGNOSIS — S8262XD Displaced fracture of lateral malleolus of left fibula, subsequent encounter for closed fracture with routine healing: Secondary | ICD-10-CM | POA: Diagnosis not present

## 2019-02-06 DIAGNOSIS — M25572 Pain in left ankle and joints of left foot: Secondary | ICD-10-CM | POA: Diagnosis not present

## 2019-02-09 DIAGNOSIS — N181 Chronic kidney disease, stage 1: Secondary | ICD-10-CM | POA: Diagnosis not present

## 2019-02-09 DIAGNOSIS — N390 Urinary tract infection, site not specified: Secondary | ICD-10-CM | POA: Diagnosis not present

## 2019-02-09 DIAGNOSIS — R809 Proteinuria, unspecified: Secondary | ICD-10-CM | POA: Diagnosis not present

## 2019-02-09 DIAGNOSIS — I129 Hypertensive chronic kidney disease with stage 1 through stage 4 chronic kidney disease, or unspecified chronic kidney disease: Secondary | ICD-10-CM | POA: Diagnosis not present

## 2019-02-09 DIAGNOSIS — I951 Orthostatic hypotension: Secondary | ICD-10-CM | POA: Diagnosis not present

## 2019-02-12 DIAGNOSIS — E1165 Type 2 diabetes mellitus with hyperglycemia: Secondary | ICD-10-CM | POA: Diagnosis not present

## 2019-02-12 DIAGNOSIS — G6181 Chronic inflammatory demyelinating polyneuritis: Secondary | ICD-10-CM | POA: Diagnosis not present

## 2019-02-12 DIAGNOSIS — E1129 Type 2 diabetes mellitus with other diabetic kidney complication: Secondary | ICD-10-CM | POA: Diagnosis not present

## 2019-02-12 DIAGNOSIS — C73 Malignant neoplasm of thyroid gland: Secondary | ICD-10-CM | POA: Diagnosis not present

## 2019-02-12 DIAGNOSIS — E7849 Other hyperlipidemia: Secondary | ICD-10-CM | POA: Diagnosis not present

## 2019-02-12 DIAGNOSIS — S8262XD Displaced fracture of lateral malleolus of left fibula, subsequent encounter for closed fracture with routine healing: Secondary | ICD-10-CM | POA: Diagnosis not present

## 2019-02-12 DIAGNOSIS — I10 Essential (primary) hypertension: Secondary | ICD-10-CM | POA: Diagnosis not present

## 2019-02-25 ENCOUNTER — Other Ambulatory Visit (HOSPITAL_COMMUNITY): Payer: Self-pay | Admitting: Endocrinology

## 2019-02-25 DIAGNOSIS — C73 Malignant neoplasm of thyroid gland: Secondary | ICD-10-CM

## 2019-02-26 DIAGNOSIS — G6181 Chronic inflammatory demyelinating polyneuritis: Secondary | ICD-10-CM | POA: Diagnosis not present

## 2019-02-26 DIAGNOSIS — N390 Urinary tract infection, site not specified: Secondary | ICD-10-CM | POA: Diagnosis not present

## 2019-03-05 DIAGNOSIS — S8262XD Displaced fracture of lateral malleolus of left fibula, subsequent encounter for closed fracture with routine healing: Secondary | ICD-10-CM | POA: Diagnosis not present

## 2019-03-06 ENCOUNTER — Other Ambulatory Visit: Payer: Self-pay | Admitting: Gastroenterology

## 2019-03-16 DIAGNOSIS — S8262XD Displaced fracture of lateral malleolus of left fibula, subsequent encounter for closed fracture with routine healing: Secondary | ICD-10-CM | POA: Diagnosis not present

## 2019-03-16 DIAGNOSIS — L97919 Non-pressure chronic ulcer of unspecified part of right lower leg with unspecified severity: Secondary | ICD-10-CM | POA: Diagnosis not present

## 2019-03-17 ENCOUNTER — Other Ambulatory Visit: Payer: Self-pay | Admitting: *Deleted

## 2019-03-17 DIAGNOSIS — R809 Proteinuria, unspecified: Secondary | ICD-10-CM | POA: Diagnosis not present

## 2019-03-17 DIAGNOSIS — R748 Abnormal levels of other serum enzymes: Secondary | ICD-10-CM | POA: Diagnosis not present

## 2019-03-17 DIAGNOSIS — N181 Chronic kidney disease, stage 1: Secondary | ICD-10-CM | POA: Diagnosis not present

## 2019-03-17 MED ORDER — ONDANSETRON 8 MG PO TBDP: 8.0000 mg | ORAL_TABLET | ORAL | 2 refills | Status: DC

## 2019-03-17 NOTE — Telephone Encounter (Signed)
Refill request fax from pharmacy

## 2019-03-18 DIAGNOSIS — G6181 Chronic inflammatory demyelinating polyneuritis: Secondary | ICD-10-CM | POA: Diagnosis not present

## 2019-03-19 DIAGNOSIS — G6181 Chronic inflammatory demyelinating polyneuritis: Secondary | ICD-10-CM | POA: Diagnosis not present

## 2019-03-23 ENCOUNTER — Other Ambulatory Visit: Payer: Self-pay

## 2019-03-23 ENCOUNTER — Encounter (HOSPITAL_COMMUNITY)
Admission: RE | Admit: 2019-03-23 | Discharge: 2019-03-23 | Disposition: A | Discharge status: Home / Self Care | Payer: BLUE CROSS/BLUE SHIELD | Source: Ambulatory Visit | Attending: Endocrinology | Admitting: Endocrinology

## 2019-03-23 DIAGNOSIS — C73 Malignant neoplasm of thyroid gland: Secondary | ICD-10-CM

## 2019-03-23 MED ORDER — THYROTROPIN ALFA 1.1 MG IM SOLR
0.9000 mg | INTRAMUSCULAR | Status: AC
  Administered 2019-03-23: 0.9 mg via INTRAMUSCULAR

## 2019-03-23 MED ORDER — STERILE WATER FOR INJECTION IJ SOLN
INTRAMUSCULAR | Status: AC
  Filled 2019-03-23: qty 10

## 2019-03-24 ENCOUNTER — Telehealth: Payer: Self-pay | Admitting: Neurology

## 2019-03-24 ENCOUNTER — Encounter (HOSPITAL_COMMUNITY)
Admission: RE | Admit: 2019-03-24 | Discharge: 2019-03-24 | Disposition: A | Discharge status: Home / Self Care | Payer: BLUE CROSS/BLUE SHIELD | Source: Ambulatory Visit | Attending: Endocrinology | Admitting: Endocrinology

## 2019-03-24 DIAGNOSIS — C73 Malignant neoplasm of thyroid gland: Secondary | ICD-10-CM | POA: Diagnosis not present

## 2019-03-24 MED ORDER — THYROTROPIN ALFA 1.1 MG IM SOLR
0.9000 mg | INTRAMUSCULAR | Status: AC
  Administered 2019-03-24: 0.9 mg via INTRAMUSCULAR

## 2019-03-24 MED ORDER — STERILE WATER FOR INJECTION IJ SOLN
INTRAMUSCULAR | Status: AC
  Filled 2019-03-24: qty 10

## 2019-03-24 NOTE — Telephone Encounter (Signed)
[name] called regarding this patient and needing to speak with you regarding her Medication and her having a lot of issues with her Infusion. Please Call. Thanks

## 2019-03-25 ENCOUNTER — Other Ambulatory Visit: Payer: Self-pay

## 2019-03-25 ENCOUNTER — Encounter (HOSPITAL_COMMUNITY)
Admission: RE | Admit: 2019-03-25 | Discharge: 2019-03-25 | Disposition: A | Discharge status: Home / Self Care | Payer: BLUE CROSS/BLUE SHIELD | Source: Ambulatory Visit | Attending: Endocrinology | Admitting: Endocrinology

## 2019-03-25 DIAGNOSIS — C73 Malignant neoplasm of thyroid gland: Secondary | ICD-10-CM | POA: Diagnosis not present

## 2019-03-25 NOTE — Telephone Encounter (Signed)
I spoke with Milinda Hirschfeld from [facility] and he said that patient is trouble with the infusions at 120 over 2 days.  Can they change this to 40 over 3 days?

## 2019-03-26 DIAGNOSIS — Z794 Long term (current) use of insulin: Secondary | ICD-10-CM | POA: Diagnosis not present

## 2019-03-26 DIAGNOSIS — E1129 Type 2 diabetes mellitus with other diabetic kidney complication: Secondary | ICD-10-CM | POA: Diagnosis not present

## 2019-03-26 DIAGNOSIS — N182 Chronic kidney disease, stage 2 (mild): Secondary | ICD-10-CM | POA: Diagnosis not present

## 2019-03-26 DIAGNOSIS — Z4681 Encounter for fitting and adjustment of insulin pump: Secondary | ICD-10-CM | POA: Diagnosis not present

## 2019-03-26 NOTE — Telephone Encounter (Signed)
Yes, that would be fine. Thanks

## 2019-03-26 NOTE — Telephone Encounter (Signed)
Called [name] and informed him that it is ok to spread out the infusion per Dr. Posey Pronto.

## 2019-03-27 ENCOUNTER — Encounter (HOSPITAL_COMMUNITY)
Admission: RE | Admit: 2019-03-27 | Discharge: 2019-03-27 | Disposition: A | Discharge status: Home / Self Care | Payer: BLUE CROSS/BLUE SHIELD | Source: Ambulatory Visit | Attending: Endocrinology | Admitting: Endocrinology

## 2019-03-27 ENCOUNTER — Other Ambulatory Visit: Payer: Self-pay

## 2019-03-27 DIAGNOSIS — C73 Malignant neoplasm of thyroid gland: Secondary | ICD-10-CM | POA: Diagnosis not present

## 2019-03-27 MED ORDER — SODIUM IODIDE I 131 CAPSULE
4.0000 | Freq: Once | INTRAVENOUS | Status: AC | PRN
  Administered 2019-03-27: 4 via ORAL

## 2019-04-06 DIAGNOSIS — M25572 Pain in left ankle and joints of left foot: Secondary | ICD-10-CM | POA: Diagnosis not present

## 2019-04-06 DIAGNOSIS — S8262XD Displaced fracture of lateral malleolus of left fibula, subsequent encounter for closed fracture with routine healing: Secondary | ICD-10-CM | POA: Diagnosis not present

## 2019-04-08 DIAGNOSIS — G6181 Chronic inflammatory demyelinating polyneuritis: Secondary | ICD-10-CM | POA: Diagnosis not present

## 2019-04-10 DIAGNOSIS — G6181 Chronic inflammatory demyelinating polyneuritis: Secondary | ICD-10-CM | POA: Diagnosis not present

## 2019-04-27 DIAGNOSIS — G6181 Chronic inflammatory demyelinating polyneuritis: Secondary | ICD-10-CM | POA: Diagnosis not present

## 2019-04-28 DIAGNOSIS — G6181 Chronic inflammatory demyelinating polyneuritis: Secondary | ICD-10-CM | POA: Diagnosis not present

## 2019-04-29 DIAGNOSIS — G6181 Chronic inflammatory demyelinating polyneuritis: Secondary | ICD-10-CM | POA: Diagnosis not present

## 2019-05-04 DIAGNOSIS — E1165 Type 2 diabetes mellitus with hyperglycemia: Secondary | ICD-10-CM | POA: Diagnosis not present

## 2019-05-06 DIAGNOSIS — S8262XD Displaced fracture of lateral malleolus of left fibula, subsequent encounter for closed fracture with routine healing: Secondary | ICD-10-CM | POA: Diagnosis not present

## 2019-05-06 DIAGNOSIS — M25572 Pain in left ankle and joints of left foot: Secondary | ICD-10-CM | POA: Diagnosis not present

## 2019-05-08 DIAGNOSIS — M25562 Pain in left knee: Secondary | ICD-10-CM | POA: Diagnosis not present

## 2019-05-08 DIAGNOSIS — M25561 Pain in right knee: Secondary | ICD-10-CM | POA: Diagnosis not present

## 2019-05-18 ENCOUNTER — Other Ambulatory Visit: Payer: Self-pay | Admitting: Gastroenterology

## 2019-05-20 ENCOUNTER — Other Ambulatory Visit: Payer: Self-pay

## 2019-05-20 DIAGNOSIS — G6181 Chronic inflammatory demyelinating polyneuritis: Secondary | ICD-10-CM | POA: Diagnosis not present

## 2019-05-20 MED ORDER — GABAPENTIN 300 MG PO CAPS: 600.0000 mg | ORAL_CAPSULE | Freq: Three times a day (TID) | ORAL | 1 refills | Status: DC

## 2019-05-21 DIAGNOSIS — G6181 Chronic inflammatory demyelinating polyneuritis: Secondary | ICD-10-CM | POA: Diagnosis not present

## 2019-05-22 DIAGNOSIS — G6181 Chronic inflammatory demyelinating polyneuritis: Secondary | ICD-10-CM | POA: Diagnosis not present

## 2019-05-23 DIAGNOSIS — G6181 Chronic inflammatory demyelinating polyneuritis: Secondary | ICD-10-CM | POA: Diagnosis not present

## 2019-06-03 DIAGNOSIS — M25572 Pain in left ankle and joints of left foot: Secondary | ICD-10-CM | POA: Diagnosis not present

## 2019-06-03 DIAGNOSIS — S8262XD Displaced fracture of lateral malleolus of left fibula, subsequent encounter for closed fracture with routine healing: Secondary | ICD-10-CM | POA: Diagnosis not present

## 2019-06-08 DIAGNOSIS — G6181 Chronic inflammatory demyelinating polyneuritis: Secondary | ICD-10-CM | POA: Diagnosis not present

## 2019-06-09 DIAGNOSIS — G6181 Chronic inflammatory demyelinating polyneuritis: Secondary | ICD-10-CM | POA: Diagnosis not present

## 2019-06-09 DIAGNOSIS — M1711 Unilateral primary osteoarthritis, right knee: Secondary | ICD-10-CM | POA: Diagnosis not present

## 2019-06-09 DIAGNOSIS — M1712 Unilateral primary osteoarthritis, left knee: Secondary | ICD-10-CM | POA: Diagnosis not present

## 2019-06-10 DIAGNOSIS — G6181 Chronic inflammatory demyelinating polyneuritis: Secondary | ICD-10-CM | POA: Diagnosis not present

## 2019-06-11 DIAGNOSIS — G6181 Chronic inflammatory demyelinating polyneuritis: Secondary | ICD-10-CM | POA: Diagnosis not present

## 2019-06-16 DIAGNOSIS — M25562 Pain in left knee: Secondary | ICD-10-CM | POA: Diagnosis not present

## 2019-06-16 DIAGNOSIS — R262 Difficulty in walking, not elsewhere classified: Secondary | ICD-10-CM | POA: Diagnosis not present

## 2019-06-16 DIAGNOSIS — M545 Low back pain: Secondary | ICD-10-CM | POA: Diagnosis not present

## 2019-06-16 DIAGNOSIS — M1712 Unilateral primary osteoarthritis, left knee: Secondary | ICD-10-CM | POA: Diagnosis not present

## 2019-06-16 DIAGNOSIS — M6281 Muscle weakness (generalized): Secondary | ICD-10-CM | POA: Diagnosis not present

## 2019-06-16 DIAGNOSIS — M25561 Pain in right knee: Secondary | ICD-10-CM | POA: Diagnosis not present

## 2019-06-16 DIAGNOSIS — M25672 Stiffness of left ankle, not elsewhere classified: Secondary | ICD-10-CM | POA: Diagnosis not present

## 2019-06-18 DIAGNOSIS — M1712 Unilateral primary osteoarthritis, left knee: Secondary | ICD-10-CM | POA: Diagnosis not present

## 2019-06-18 DIAGNOSIS — M25672 Stiffness of left ankle, not elsewhere classified: Secondary | ICD-10-CM | POA: Diagnosis not present

## 2019-06-18 DIAGNOSIS — M25561 Pain in right knee: Secondary | ICD-10-CM | POA: Diagnosis not present

## 2019-06-18 DIAGNOSIS — M6281 Muscle weakness (generalized): Secondary | ICD-10-CM | POA: Diagnosis not present

## 2019-06-18 DIAGNOSIS — M545 Low back pain: Secondary | ICD-10-CM | POA: Diagnosis not present

## 2019-06-18 DIAGNOSIS — M25562 Pain in left knee: Secondary | ICD-10-CM | POA: Diagnosis not present

## 2019-06-18 DIAGNOSIS — R262 Difficulty in walking, not elsewhere classified: Secondary | ICD-10-CM | POA: Diagnosis not present

## 2019-06-19 DIAGNOSIS — G6181 Chronic inflammatory demyelinating polyneuritis: Secondary | ICD-10-CM | POA: Diagnosis not present

## 2019-06-19 DIAGNOSIS — S8262XD Displaced fracture of lateral malleolus of left fibula, subsequent encounter for closed fracture with routine healing: Secondary | ICD-10-CM | POA: Diagnosis not present

## 2019-06-19 DIAGNOSIS — E1129 Type 2 diabetes mellitus with other diabetic kidney complication: Secondary | ICD-10-CM | POA: Diagnosis not present

## 2019-06-22 ENCOUNTER — Encounter: Payer: Self-pay | Admitting: Neurology

## 2019-06-22 ENCOUNTER — Ambulatory Visit (INDEPENDENT_AMBULATORY_CARE_PROVIDER_SITE_OTHER): Payer: BC Managed Care – PPO | Admitting: Neurology

## 2019-06-22 ENCOUNTER — Other Ambulatory Visit: Payer: Self-pay

## 2019-06-22 VITALS — BP 121/79 | HR 101 | Ht 66.0 in | Wt 285.0 lb

## 2019-06-22 DIAGNOSIS — G6181 Chronic inflammatory demyelinating polyneuritis: Secondary | ICD-10-CM | POA: Diagnosis not present

## 2019-06-22 NOTE — Progress Notes (Signed)
Follow-up Visit   Date: 06/22/19    [name] MRN: [id number] DOB: [birth year]   Interim History: [name] is a 53 y.o. right-handed Caucasian female with insulin-dependent diabetes mellitus, thyroid cancer s/p resection secondary hypothyroidism (2016), hypertension, hyperlipidemia, GERD, s/p left L5 hemilaminectomy and discectomy, dysautonomia, chronic left ankle pain returning for a follow-up of chronic inflammatory demyelinating polyradiculoneuropathy.  History of present illness: She was diagnosed with CIDP in the fall of 2018 after left sural nerve biopsy showed demyelinating and axon loss findings.  Several years preceding this, she was having painful paresthesias and diffuse weakness of the feet which extended in the legs and arms.  She also developed dysautonomic symptoms of bladder incontinence, orthostatic hypotension, and syncope. She was frequently falling and started using a rollator.  She was started on IVIG in February 2019 and has been experiencing problems with IV venous access therefore had port-a-cath placed in October 2019.  By the fall of 2019, she was appreciating improved proximal arm strength and ability to grasp objects.  Tingling has improved, however the numbness remains unchanged.  She uses a power chair as needed for long distances, otherwise uses a walker at home.     UPDATE 12/21/2018:  She is here for follow-up visit.  She continues to receive IVIG every 3 weeks and reports that she is scheduled to have it this week.  She was getting IVIG under hardship program for 2019 and is concerned that she may not have financial support for 2020.  She has been able to walk better with a walker and has not had any improvement with her hand strength.  He has suffered 4 falls, but has been able to stand up without assistance.   UPDATE 06/22/2019:  She suffered a fall in February and fractured her left tibia and due to COVID, she was treated with  immobilization.  She has been very sedentary over the past few months because of this and temporarily held her PT. She spends most of her day in a wheelchair or bed.  She complains of bilateral knee pain and had it injected last week. She continues to get IVIG every 3 weeks and is tolerating it well.  Her hand strength is slowly improving.  It is difficult to determine any change in gait due to being sedentary.   Medications:  Current Outpatient Medications on File Prior to Visit  Medication Sig Dispense Refill  . acetaminophen (TYLENOL) 500 MG tablet Take 1,000 mg by mouth every 6 (six) hours as needed for moderate pain or headache.     . ALPRAZolam (XANAX) 0.5 MG tablet Take 0.5 mg by mouth every 6 (six) hours as needed for anxiety or sleep.     Marland Kitchen ammonium lactate (LAC-HYDRIN) 12 % lotion Apply 1 application topically 2 (two) times daily.     Marland Kitchen aspirin EC 81 MG tablet Take 81 mg by mouth daily.    Marland Kitchen atorvastatin (LIPITOR) 20 MG tablet Take 20 mg by mouth daily at 6 PM.     . b complex vitamins tablet Take 1 tablet by mouth daily.    . Biotin 10000 MCG TABS Take 10,000 mcg by mouth at bedtime.    . clobetasol cream (TEMOVATE) 9.67 % Apply 1 application topically 2 (two) times daily.    . clotrimazole-betamethasone (LOTRISONE) lotion Apply 1 application topically 2 (two) times daily as needed (for irritation).   2  . cyclobenzaprine (FLEXERIL) 10 MG tablet Take 10 mg by mouth See  admin instructions. Take 10 mg by mouth at bedtime. Take 10 mg by mouth every 8 hours as needed for muscle spasms    . D3-50 50000 units capsule Take 50,000 Units by mouth every Saturday.  4  . Dermatological Products, Misc. (HYLATOPIC PLUS) CREA Apply 1 application topically 3 (three) times daily.     Marland Kitchen dexlansoprazole (DEXILANT) 60 MG capsule Take 60 mg by mouth daily at 6 (six) AM.     . dicyclomine (BENTYL) 10 MG capsule TAKE 1 CAPSULE BY MOUTH 4 TIMES DAILY BEFORE MEAL(S) AND AT BEDTIME 90 capsule 0  . doxycycline  (PERIOSTAT) 20 MG tablet Take 20 mg by mouth 2 (two) times daily.   3  . DULoxetine (CYMBALTA) 30 MG capsule Take 30 mg by mouth daily.    . fluticasone (FLONASE) 50 MCG/ACT nasal spray Place 1 spray into both nostrils at bedtime.     . furosemide (LASIX) 40 MG tablet Take 40 mg by mouth every 3 (three) days. As needed for SOB or leg swelling    . gabapentin (NEURONTIN) 300 MG capsule Take 2 capsules (600 mg total) by mouth 3 (three) times daily. 180 capsule 1  . halobetasol (ULTRAVATE) 0.05 % cream Apply 1 application topically 3 (three) times daily as needed (for finger blistering).     Marland Kitchen HYDROcodone-acetaminophen (NORCO) 7.5-325 MG tablet Take 1-2 tablets by mouth every 6 (six) hours as needed for moderate pain (for pain.). 40 tablet 0  . hyoscyamine (ANASPAZ) 0.125 MG TBDP disintergrating tablet Place 0.25 tablets under the tongue 3 (three) times daily with meals.     Marland Kitchen ibuprofen (ADVIL,MOTRIN) 200 MG tablet Take 800 mg by mouth every 8 (eight) hours as needed for mild pain.     Marland Kitchen immune globulin, human, (GAMMAGARD S/D LESS IGA) 10 g injection Inject 60 g into the vein every 21 ( twenty-one) days.    . Insulin Human (INSULIN PUMP) SOLN Inject into the skin.    Marland Kitchen insulin regular human CONCENTRATED (HUMULIN R) 500 UNIT/ML injection Inject 2 Units into the skin See admin instructions. 2.0 units /hour 0500-2359, 1.95units 6834-1962 with carb ratio 1:50 for meal time bolus    . INVOKAMET 50-1000 MG TABS Take 1 tablet by mouth 2 (two) times daily.   5  . ketoconazole (NIZORAL) 2 % cream Apply 1 application topically 2 (two) times daily as needed (for toes).     Marland Kitchen levothyroxine (SYNTHROID, LEVOTHROID) 137 MCG tablet Take 274 mcg by mouth daily before breakfast.    . lisinopril (PRINIVIL,ZESTRIL) 20 MG tablet Take 20 mg by mouth daily. 20 qam, 40qpm    . metoprolol succinate (TOPROL-XL) 100 MG 24 hr tablet Take 50-100 mg by mouth See admin instructions. Take 50 mg by mouth in the morning and take 100 mg  by mouth in the evening    . metroNIDAZOLE (METROCREAM) 0.75 % cream Apply 1 application topically 2 (two) times daily.     . Multiple Vitamin (MULTIVITAMIN) tablet Take 1 tablet by mouth at bedtime.     . ondansetron (ZOFRAN-ODT) 8 MG disintegrating tablet Take 1 tablet (8 mg total) by mouth See admin instructions. Take 8 mg by mouth 3 times daily before meals. Take 8 mg by mouth every 6 hours as needed for nausea and vomiting. 45 tablet 2  . PROCTOSOL HC 2.5 % rectal cream Apply 1 application topically 2 (two) times daily as needed for hemorrhoids.   2  . triamcinolone ointment (KENALOG) 0.1 % Apply 1 application  topically 2 (two) times daily as needed (for finger blister).     . vitamin B-12 (CYANOCOBALAMIN) 1000 MCG tablet Take 1,000 mcg by mouth daily.     No current facility-administered medications on file prior to visit.     Allergies:  Allergies  Allergen Reactions  . Penicillins Anaphylaxis, Shortness Of Breath, Swelling, Palpitations and Other (See Comments)    Has patient had a PCN reaction causing immediate rash, facial/tongue/throat swelling, SOB or lightheadedness with hypotension: Yes Has patient had a PCN reaction causing severe rash involving mucus membranes or skin necrosis: No Has patient had a PCN reaction that required hospitalization No Has patient had a PCN reaction occurring within the last 10 years: No If all of the above answers are "NO", then may proceed with Cephalosporin use.   . Clarithromycin Nausea And Vomiting and Other (See Comments)    Any antibiotic (doxyclycine, etc)  . Other Other (See Comments)    All antibiotics makes patient sick  . Adhesive [Tape] Itching and Other (See Comments)    Redness  . Latex Itching, Rash and Other (See Comments)    Blisters with prolonged contact  . Moxifloxacin Nausea And Vomiting and Other (See Comments)    Needs Zofran or Phenergan    Review of Systems:  CONSTITUTIONAL: No fevers, chills, night sweats, or weight  loss.  EYES: No visual changes or eye pain ENT: No hearing changes.  No history of nose bleeds.   RESPIRATORY: No cough, wheezing and shortness of breath.   CARDIOVASCULAR: Negative for chest pain, and palpitations.   GI: +for abdominal discomfort, blood in stools or black stools.  No recent change in bowel habits.   GU:  +history of incontinence.   MUSCLOSKELETAL: No history of joint pain or swelling.  +myalgias.  +weakness SKIN: Negative for lesions, rash, and itching.   ENDOCRINE: Negative for cold or heat intolerance, polydipsia or goiter.   PSYCH:  No  depression +anxiety symptoms.   NEURO: As Above.   Vital Signs:  BP 121/79   Pulse (!) 101   Ht _0  (1.676 m)   Wt 285 lb (129.3 kg)   SpO2 92%   BMI 46.00 kg/m   General Medical Exam:   General:  Well appearing, comfortable   Neurological Exam: MENTAL STATUS including orientation to time, place, person, recent and remote memory, attention span and concentration, language, and fund of knowledge is normal.  Speech is not dysarthric.  CRANIAL NERVES:    Face is symmetric.    MOTOR: Tone is normal.  Improved muscle bulk in the intrinsic hand muscles.  No atrophy.  There is some give-way weakness throughout, but with repeated testing and encouragement she is able to resist throughout, except left foot as noted below.  Right Upper Extremity:    Left Upper Extremity:    Deltoid  5/5   Deltoid  5/5   Biceps  5/5   Biceps  5/5   Triceps  5/5   Triceps  5/5   Wrist extensors  5/5   Wrist extensors  5/5   Wrist flexors 5-/5  Wrist flexors  5-/5  Finger extensors  5/5   Finger extensors  4+/5   Finger flexors  4-/5   Finger flexors  4+/5  Dorsal interossei  5-/5   Dorsal interossei  5-/5   Abductor pollicis  5-/5   Abductor pollicis  5-/5  Tone (Ashworth scale)  0  Tone (Ashworth scale)  0   Right Lower Extremity:  Left Lower Extremity:    Hip flexors  5/5   Hip flexors  5-/5   Hip extensors  5/5   Hip  extensors  5/5   Knee flexors  5/5   Knee flexors  5-/5   Knee extensors         5/5 Inversion 5/5  inversion 2/5  Eversion 5/5  Eversion 2/5  Dorsiflexors  5/5   Dorsiflexors  4/5   Plantarflexors  5/5   Plantarflexors  4/5   Toe extensors  4-/5   Toe extensors  1/5   Toe flexors  4-/5   Toe flexors  1/5   Tone (Ashworth scale)  0  Tone (Ashworth scale)  0    MSRs:    Right                                                                 Left brachioradialis 2+  brachioradialis 2+  biceps 2+  biceps 2+  triceps 2+  triceps 2+  patellar 3+  patellar 3+  ankle jerk 0  ankle jerk 0   SENSORY:  Pin prick reduced over the hands and mild-calf distally.   COORDINATION/GAIT:  Gait not tested   Data: NCS/EMG of the arms 01/29/2017: The electrophysiologic testing is most consistent with an active on chronic, distal and symmetric sensorimotor polyneuropathy, demyelinating and axon loss in type. Overall, these findings are severe in degree electrically.   In addition, the presence of temporal dispersion suggests an acquired condition.  NCS/EMG of the legs 01/10/2017:    The electrophysiologic findings are most consistent with a subacute sensorimotor polyneuropathy, axon loss in type, affecting the lower extremities and worse on the left. Overall, these findings are severe in degree electrically.  Labs 01/25/2017:  ESR 10, CRP 0.3, vitamin B12 256, ACE 5, MMA 231, ANA neg, SSA/B neg, copper 92, vitamin B1 6*, SPEP with IFE no M protein  CSF 02/19/2017:  R1200, W0 G76 P37  ACE 10, IgG index 0.47, Lyme neg, cytology neg, no OCB  MRI lumbar spine wwo contrast 02/28/2017: 1. Stable appearance of the lumbar spine. No acute abnormality identified. 2. Postoperative changes on the left L5-S1 without residual stenosis. 3. Small left subarticular disc protrusion at L3-4 encroaching upon the left lateral recess, and potentially affecting the descending left L4 nerve root. 4. Mild bilateral  foraminal stenosis at L4-5 related to disc bulge and facet degeneration.  MRI cervical spine wwo contrast 09/09/2017: 1. Normal MRI appearance of the cervical spinal cord. No cord signal abnormality is identified. 2. Multilobulated disc protrusion at C4-5, flattening the right hemi cord without cord signal changes. 3. Broad posterior disc bulge at C6-7 with resultant mild canal and left C7 foraminal stenosis. 4. Central disc protrusion at C3-4 with resultant mild spinal stenosis. No significant cord deformity.  MRI brain wwo contrast 09/09/2017:  Normal MRI of the brain. No acute intracranial abnormality identified.  Left sural nerve biopsy performed on 10/22 shows chronic (predominant) and acute loss of large myelinated fibers with possible demyelination/remyelination process consistent with prior diagnosis of chronic inflammatory demyelinating poly-radicular neuropathy; marked thickening of the endoneutrial vessels consistent with diabetic neuropathy, clinical correlation recommended.   Left vastus lateralis muscle biopsy: 2 isolated foci of very mild perivascular, possibly vascular, inflammation,  and mild nonspecific mild myofiber atrophy and skeletal muscle example without myofiber necrosis.   IMPRESSION/PLAN: 1.  Chronic inflammatory polyradiculoneuropathy with distal weakness, sensory loss (worse on the left), and dysautonomia.  Diagnosed by sural nerve biopsy (09/2017); CSF testing was normal, however NCS/EMG was highly suggestive of a subacute polyneuropathy.  IVIG started in February 2019 and on IVIG 1g/kg every 3 weeks via port-a-cath.  She has been tolerating infusions well and showing improved motor strength in the upper extremities and right leg.  Left leg continues to have distal weakness.  There is some give-way weakness but with repeated testing, clear improvement from prior visits.  I encouraged her to be active and continue home exercises.  She is becoming more sedentary which will  only hinder optimal recovery.  Continue IVIG 1/mg over 3 days every 21 days For her painful paresthesias, continue gabapentin 660m TID.   2.  Chronic low back pain.  Continue flexeril 560mat bedtime.  Return to clinic in 6 months  Greater than 50% of this 25 minute visit was spent in counseling, explanation of diagnosis, planning of further management, and coordination of care.   Thank you for allowing me to participate in patient's care.  If I can answer any additional questions, I would be pleased to do so.    Sincerely,    Arrianna Catala K. PaPosey ProntoDO

## 2019-06-22 NOTE — Patient Instructions (Signed)
Continue IVIG every 3 weeks Continue gabapentin 600mg  three times daily Stay active to help your nerve recover and heal  Return to clinic in 6 months

## 2019-06-23 DIAGNOSIS — M545 Low back pain: Secondary | ICD-10-CM | POA: Diagnosis not present

## 2019-06-23 DIAGNOSIS — M25561 Pain in right knee: Secondary | ICD-10-CM | POA: Diagnosis not present

## 2019-06-23 DIAGNOSIS — M6281 Muscle weakness (generalized): Secondary | ICD-10-CM | POA: Diagnosis not present

## 2019-06-23 DIAGNOSIS — M1712 Unilateral primary osteoarthritis, left knee: Secondary | ICD-10-CM | POA: Diagnosis not present

## 2019-06-23 DIAGNOSIS — R262 Difficulty in walking, not elsewhere classified: Secondary | ICD-10-CM | POA: Diagnosis not present

## 2019-06-23 DIAGNOSIS — M25562 Pain in left knee: Secondary | ICD-10-CM | POA: Diagnosis not present

## 2019-06-23 DIAGNOSIS — M25672 Stiffness of left ankle, not elsewhere classified: Secondary | ICD-10-CM | POA: Diagnosis not present

## 2019-06-25 DIAGNOSIS — M25561 Pain in right knee: Secondary | ICD-10-CM | POA: Diagnosis not present

## 2019-06-25 DIAGNOSIS — M25672 Stiffness of left ankle, not elsewhere classified: Secondary | ICD-10-CM | POA: Diagnosis not present

## 2019-06-25 DIAGNOSIS — R262 Difficulty in walking, not elsewhere classified: Secondary | ICD-10-CM | POA: Diagnosis not present

## 2019-06-25 DIAGNOSIS — M1712 Unilateral primary osteoarthritis, left knee: Secondary | ICD-10-CM | POA: Diagnosis not present

## 2019-06-25 DIAGNOSIS — M545 Low back pain: Secondary | ICD-10-CM | POA: Diagnosis not present

## 2019-06-25 DIAGNOSIS — M6281 Muscle weakness (generalized): Secondary | ICD-10-CM | POA: Diagnosis not present

## 2019-06-25 DIAGNOSIS — M25562 Pain in left knee: Secondary | ICD-10-CM | POA: Diagnosis not present

## 2019-06-30 DIAGNOSIS — M25672 Stiffness of left ankle, not elsewhere classified: Secondary | ICD-10-CM | POA: Diagnosis not present

## 2019-06-30 DIAGNOSIS — M1712 Unilateral primary osteoarthritis, left knee: Secondary | ICD-10-CM | POA: Diagnosis not present

## 2019-06-30 DIAGNOSIS — M25562 Pain in left knee: Secondary | ICD-10-CM | POA: Diagnosis not present

## 2019-06-30 DIAGNOSIS — R262 Difficulty in walking, not elsewhere classified: Secondary | ICD-10-CM | POA: Diagnosis not present

## 2019-06-30 DIAGNOSIS — M545 Low back pain: Secondary | ICD-10-CM | POA: Diagnosis not present

## 2019-06-30 DIAGNOSIS — M6281 Muscle weakness (generalized): Secondary | ICD-10-CM | POA: Diagnosis not present

## 2019-06-30 DIAGNOSIS — M25561 Pain in right knee: Secondary | ICD-10-CM | POA: Diagnosis not present

## 2019-07-01 DIAGNOSIS — M25552 Pain in left hip: Secondary | ICD-10-CM | POA: Diagnosis not present

## 2019-07-01 DIAGNOSIS — M25572 Pain in left ankle and joints of left foot: Secondary | ICD-10-CM | POA: Diagnosis not present

## 2019-07-01 DIAGNOSIS — S8262XG Displaced fracture of lateral malleolus of left fibula, subsequent encounter for closed fracture with delayed healing: Secondary | ICD-10-CM | POA: Diagnosis not present

## 2019-07-01 DIAGNOSIS — G6181 Chronic inflammatory demyelinating polyneuritis: Secondary | ICD-10-CM | POA: Diagnosis not present

## 2019-07-02 DIAGNOSIS — G6181 Chronic inflammatory demyelinating polyneuritis: Secondary | ICD-10-CM | POA: Diagnosis not present

## 2019-07-03 DIAGNOSIS — G6181 Chronic inflammatory demyelinating polyneuritis: Secondary | ICD-10-CM | POA: Diagnosis not present

## 2019-07-06 DIAGNOSIS — M6281 Muscle weakness (generalized): Secondary | ICD-10-CM | POA: Diagnosis not present

## 2019-07-06 DIAGNOSIS — M25672 Stiffness of left ankle, not elsewhere classified: Secondary | ICD-10-CM | POA: Diagnosis not present

## 2019-07-06 DIAGNOSIS — M1712 Unilateral primary osteoarthritis, left knee: Secondary | ICD-10-CM | POA: Diagnosis not present

## 2019-07-06 DIAGNOSIS — M25561 Pain in right knee: Secondary | ICD-10-CM | POA: Diagnosis not present

## 2019-07-06 DIAGNOSIS — M545 Low back pain: Secondary | ICD-10-CM | POA: Diagnosis not present

## 2019-07-06 DIAGNOSIS — R262 Difficulty in walking, not elsewhere classified: Secondary | ICD-10-CM | POA: Diagnosis not present

## 2019-07-06 DIAGNOSIS — M25562 Pain in left knee: Secondary | ICD-10-CM | POA: Diagnosis not present

## 2019-07-07 ENCOUNTER — Ambulatory Visit (INDEPENDENT_AMBULATORY_CARE_PROVIDER_SITE_OTHER): Payer: BC Managed Care – PPO | Admitting: Podiatry

## 2019-07-07 ENCOUNTER — Ambulatory Visit (INDEPENDENT_AMBULATORY_CARE_PROVIDER_SITE_OTHER): Payer: BC Managed Care – PPO

## 2019-07-07 ENCOUNTER — Telehealth: Payer: Self-pay | Admitting: Internal Medicine

## 2019-07-07 ENCOUNTER — Other Ambulatory Visit: Payer: Self-pay

## 2019-07-07 DIAGNOSIS — M779 Enthesopathy, unspecified: Secondary | ICD-10-CM | POA: Diagnosis not present

## 2019-07-07 DIAGNOSIS — M79675 Pain in left toe(s): Secondary | ICD-10-CM

## 2019-07-07 DIAGNOSIS — G6181 Chronic inflammatory demyelinating polyneuritis: Secondary | ICD-10-CM | POA: Diagnosis not present

## 2019-07-07 DIAGNOSIS — L601 Onycholysis: Secondary | ICD-10-CM

## 2019-07-07 DIAGNOSIS — M2042 Other hammer toe(s) (acquired), left foot: Secondary | ICD-10-CM

## 2019-07-07 MED ORDER — DICLOFENAC SODIUM 1 % TD GEL: 2.0000 g | Freq: Four times a day (QID) | TRANSDERMAL | 2 refills | Status: DC

## 2019-07-07 NOTE — Telephone Encounter (Signed)

## 2019-07-08 ENCOUNTER — Encounter: Payer: Self-pay | Admitting: Internal Medicine

## 2019-07-08 ENCOUNTER — Ambulatory Visit (INDEPENDENT_AMBULATORY_CARE_PROVIDER_SITE_OTHER): Payer: BC Managed Care – PPO | Admitting: Internal Medicine

## 2019-07-08 ENCOUNTER — Telehealth: Payer: Self-pay | Admitting: Licensed Clinical Social Worker

## 2019-07-08 ENCOUNTER — Telehealth: Payer: Self-pay | Admitting: Podiatry

## 2019-07-08 VITALS — BP 94/70 | HR 90 | Ht 66.0 in | Wt 285.0 lb

## 2019-07-08 DIAGNOSIS — R002 Palpitations: Secondary | ICD-10-CM | POA: Diagnosis not present

## 2019-07-08 DIAGNOSIS — I951 Orthostatic hypotension: Secondary | ICD-10-CM | POA: Diagnosis not present

## 2019-07-08 DIAGNOSIS — R55 Syncope and collapse: Secondary | ICD-10-CM

## 2019-07-08 MED ORDER — LISINOPRIL 10 MG PO TABS: 10.0000 mg | ORAL_TABLET | Freq: Every day | ORAL | 3 refills | Status: DC

## 2019-07-08 MED ORDER — METOPROLOL SUCCINATE ER 50 MG PO TB24: 50.0000 mg | ORAL_TABLET | Freq: Every day | ORAL | 3 refills | Status: DC

## 2019-07-08 NOTE — Telephone Encounter (Signed)
Patient says [employer] sent a prior auth form to [place]  for her medication Dr Jacqualyn Posey called in via fax. Please complete and fax back to [facility] so she can pick up her her gel from [place].

## 2019-07-08 NOTE — Patient Instructions (Addendum)
Medication Instructions:  Your physician has recommended you make the following change in your medication:  1. Decrease your lisinopril to 10mg  before bed 2. Decrease you Metoprolol Succinate to 50mg  before bed  Labwork: None ordered.  Testing/Procedures: None ordered.  Follow-Up:  You have a telehealth visit scheduled for August 29 @ 1:30pm with Dr. Caryl Comes  Any Other Special Instructions Will Be Listed Below (If Applicable).     If you need a refill on your cardiac medications before your next appointment, please call your pharmacy.

## 2019-07-08 NOTE — Telephone Encounter (Signed)
CSW referred to assist patient with obtaining a BP cuff. CSW contacted patient to inform cuff will be delivered to home. Patient grateful for support and assistance. CSW available as needed. Jackie Demont Linford, LCSW, CCSW-MCS [phone]  

## 2019-07-08 NOTE — Progress Notes (Signed)
Patient Care Team: Reynold Bowen, MD as PCP - General (Endocrinology)   HPI  [name] is a 53 y.o. female Seen in follow-up for recurrent syncope with hypertension, documented orthostatic hypotension in the context of diabetes with likely some degree of neuropathy;  intercurrently diagnosed with CIDP   We hve recommended compressive clothing as her systolic hypertension precludes sodium loading. Long-term use of beta blockers have been helpful with exercise intolerance    RX abdominal binder w improvement.>> skin erosion from the abdominal binder.  She continues with falls.  Her mostly orthostatic.  Leg fracture February.  Knee pain further aggravating mobility.  Trying to maintain fluid intake.   Takes diuretics q3d for dypsnea and edema     Records and Results Reviewed   Past Medical History:  Diagnosis Date  . Anginal pain ([place])    chest pain  heart cath done  . Anxiety   . Cancer [facility])    thyroid cancer  . Chronic bronchitis ([place])    "get it q yr"  . Chronic inflammatory demyelinating polyneuropathy ([place])   . Chronic kidney disease    Proteinuria  . Dyspnea    sob with exertion  . Early cataract    right  . Fibromyalgia   . GERD (gastroesophageal reflux disease)   . Heart murmur   . Hepatic adenoma   . High cholesterol   . History of gout   . History of hiatal hernia   . History of kidney stones   . Hypertension   . Hypothyroidism   . IBS (irritable bowel syndrome)   . Iron deficiency anemia    "comes and goes"  . Migraine    "@ least once/month" (02/18/2015)  . Obesity   . PONV (postoperative nausea and vomiting)    patch and anti nausea meds work well  . Proteinuria   . Spinal headache   . Syncope   . Tachycardia   . Type II diabetes mellitus ([place])    pt has an insulin pump  . Wears glasses     Past Surgical History:  Procedure Laterality Date  . ABDOMINAL HYSTERECTOMY  ~2012   "lap"  . ANKLE RECONSTRUCTION Left  03/29/2016   Procedure: LEFT ANKLE LATERAL LIGAMENT RECONSTRUCTION AND PERONEAL TENDON REPAIR OR TENOLYSIS;  Surgeon: Wylene Simmer, MD;  Location: [place];  Service: Orthopedics;  Laterality: Left;  . BACK SURGERY    . CARDIAC CATHETERIZATION  04/2009   "clean"  . COLONOSCOPY  01/12/2015   Colonic polyps status post polypectomy, small internal hemorrhoids. Otherwise normal. Bx: mild chronic gastritis.  . CYSTOSCOPY WITH URETEROSCOPY, STONE BASKETRY AND STENT PLACEMENT  ~ 2006  . ESOPHAGOGASTRODUODENOSCOPY  07/21/2015   Esophageal stricture status post esophageal dilatation. Mild gastritis.  Marland Kitchen insulin pump     U 500  . IR IMAGING GUIDED PORT INSERTION  09/22/2018  . IRRIGATION AND DEBRIDEMENT KNEE Left 04/03/2018   Procedure: Arthroscopic versus open irrigation and debridement  left knee;  Surgeon: Nicholes Stairs, MD;  Location: [place];  Service: Orthopedics;  Laterality: Left;  . LAPAROSCOPIC CHOLECYSTECTOMY  05/2004  . LUMBAR DISC SURGERY  08/2011  . SURAL NERVE BX Left 09/30/2017   Procedure: LEFT SURAL NERVE AND MUSCLE BIOPSY;  Surgeon: Newman Pies, MD;  Location: [place];  Service: Neurosurgery;  Laterality: Left;  LEFT SURAL NERVE AND MUSCLE BIOPSY  . THYROIDECTOMY N/A 02/18/2015   Procedure: TOTAL THYROIDECTOMY;  Surgeon: [name], MD;  Location: [place];  Service: General;  Laterality: N/A;  . TOTAL THYROIDECTOMY  02/18/2015    Current Outpatient Medications  Medication Sig Dispense Refill  . acetaminophen (TYLENOL) 500 MG tablet Take 1,000 mg by mouth every 6 (six) hours as needed for moderate pain or headache.     . ALPRAZolam (XANAX) 0.5 MG tablet Take 0.5 mg by mouth every 6 (six) hours as needed for anxiety or sleep.     Marland Kitchen ammonium lactate (LAC-HYDRIN) 12 % lotion Apply 1 application topically 2 (two) times daily.     Marland Kitchen aspirin EC 81 MG tablet Take 81 mg by mouth daily.    Marland Kitchen atorvastatin (LIPITOR) 20 MG tablet Take 20 mg by mouth daily at 6 PM.     . b complex vitamins  tablet Take 1 tablet by mouth daily.    . Biotin 10000 MCG TABS Take 10,000 mcg by mouth at bedtime.    . clobetasol cream (TEMOVATE) 0.94 % Apply 1 application topically 2 (two) times daily.    . clotrimazole-betamethasone (LOTRISONE) lotion Apply 1 application topically 2 (two) times daily as needed (for irritation).   2  . cyclobenzaprine (FLEXERIL) 10 MG tablet Take 10 mg by mouth See admin instructions. Take 10 mg by mouth at bedtime. Take 10 mg by mouth every 8 hours as needed for muscle spasms    . D3-50 50000 units capsule Take 50,000 Units by mouth every Saturday.  4  . Dermatological Products, Misc. (HYLATOPIC PLUS) CREA Apply 1 application topically 3 (three) times daily.     Marland Kitchen dexlansoprazole (DEXILANT) 60 MG capsule Take 60 mg by mouth daily at 6 (six) AM.     . diclofenac sodium (VOLTAREN) 1 % GEL Apply 2 g topically 4 (four) times daily. Rub into affected area of foot 2 to 4 times daily 100 g 2  . dicyclomine (BENTYL) 10 MG capsule TAKE 1 CAPSULE BY MOUTH 4 TIMES DAILY BEFORE MEAL(S) AND AT BEDTIME 90 capsule 0  . doxycycline (PERIOSTAT) 20 MG tablet Take 20 mg by mouth 2 (two) times daily.   3  . DULoxetine (CYMBALTA) 30 MG capsule Take 30 mg by mouth daily.    . fluticasone (FLONASE) 50 MCG/ACT nasal spray Place 1 spray into both nostrils at bedtime.     . furosemide (LASIX) 40 MG tablet Take 40 mg by mouth every 3 (three) days. As needed for SOB or leg swelling    . gabapentin (NEURONTIN) 300 MG capsule Take 2 capsules (600 mg total) by mouth 3 (three) times daily. 180 capsule 1  . halobetasol (ULTRAVATE) 0.05 % cream Apply 1 application topically 3 (three) times daily as needed (for finger blistering).     Marland Kitchen HYDROcodone-acetaminophen (NORCO) 7.5-325 MG tablet Take 1-2 tablets by mouth every 6 (six) hours as needed for moderate pain (for pain.). 40 tablet 0  . hyoscyamine (ANASPAZ) 0.125 MG TBDP disintergrating tablet Place 0.25 tablets under the tongue 3 (three) times daily with  meals.     . immune globulin, human, (GAMMAGARD S/D LESS IGA) 10 g injection Inject 60 g into the vein every 21 ( twenty-one) days.    . Insulin Human (INSULIN PUMP) SOLN Inject into the skin.    Marland Kitchen insulin regular human CONCENTRATED (HUMULIN R) 500 UNIT/ML injection Inject 2 Units into the skin See admin instructions. 2.0 units /hour 0500-2359, 1.95units 7096-2836 with carb ratio 1:50 for meal time bolus    . INVOKAMET 50-1000 MG TABS Take 1 tablet by mouth 2 (two) times daily.  5  . ketoconazole (NIZORAL) 2 % cream Apply 1 application topically 2 (two) times daily as needed (for toes).     Marland Kitchen levothyroxine (SYNTHROID, LEVOTHROID) 137 MCG tablet Take 274 mcg by mouth daily before breakfast.    . lisinopril (PRINIVIL,ZESTRIL) 20 MG tablet Take 20 mg by mouth daily. 20 qam, 40qpm    . metoprolol succinate (TOPROL-XL) 100 MG 24 hr tablet Take 50-100 mg by mouth See admin instructions. Take 50 mg by mouth in the morning and take 100 mg by mouth in the evening    . metroNIDAZOLE (METROCREAM) 0.75 % cream Apply 1 application topically 2 (two) times daily.     . Multiple Vitamin (MULTIVITAMIN) tablet Take 1 tablet by mouth at bedtime.     . ondansetron (ZOFRAN-ODT) 8 MG disintegrating tablet Take 1 tablet (8 mg total) by mouth See admin instructions. Take 8 mg by mouth 3 times daily before meals. Take 8 mg by mouth every 6 hours as needed for nausea and vomiting. 45 tablet 2  . PROCTOSOL HC 2.5 % rectal cream Apply 1 application topically 2 (two) times daily as needed for hemorrhoids.   2  . triamcinolone ointment (KENALOG) 0.1 % Apply 1 application topically 2 (two) times daily as needed (for finger blister).     . vitamin B-12 (CYANOCOBALAMIN) 1000 MCG tablet Take 1,000 mcg by mouth daily.     No current facility-administered medications for this visit.     Allergies  Allergen Reactions  . Penicillins Anaphylaxis, Shortness Of Breath, Swelling, Palpitations and Other (See Comments)    Has patient  had a PCN reaction causing immediate rash, facial/tongue/throat swelling, SOB or lightheadedness with hypotension: Yes Has patient had a PCN reaction causing severe rash involving mucus membranes or skin necrosis: No Has patient had a PCN reaction that required hospitalization No Has patient had a PCN reaction occurring within the last 10 years: No If all of the above answers are "NO", then may proceed with Cephalosporin use.   . Clarithromycin Nausea And Vomiting and Other (See Comments)    Any antibiotic (doxyclycine, etc)  . Other Other (See Comments)    All antibiotics makes patient sick  . Adhesive [Tape] Itching and Other (See Comments)    Redness  . Latex Itching, Rash and Other (See Comments)    Blisters with prolonged contact  . Moxifloxacin Nausea And Vomiting and Other (See Comments)    Needs Zofran or Phenergan      Review of Systems negative except from HPI and PMH  Physical Exam  BP 94/70   Pulse 90   Ht 5\' 6"  (1.676 m)   Wt 285 lb (129.3 kg)   SpO2 91%   BMI 46.00 kg/m  Well developed and Morbidly obese in no acute distress HENT normal Neck supple with JVP-flat Clear Regular rate and rhythm, no  murmur Abd-soft with active BS No Clubbing cyanosis   edema Skin-warm and dry A & Oriented  Grossly normal sensory and motor function  ECG sinus at 90 Interval 16/09/37 Axis left -38 Poor R wave progression  Assessment and  Plan   Orthostatic hypotension  Syncope  Diabetes  Morbid obesity  CIDP ( chronic inflammatory demyelinating polyneuropathy)  Hypertension   She troubles with low blood pressure recurrent falls.  While she has some hypertension, biggest risk I think in the short and intermediate term or falls and trauma.  Hence, I have taken the liberty of decreasing her antihypertensives significantly from lisinopril 20/40--20 at bedtime  and for metoprolol 50/100--50 at bedtime.  We will follow-up with telehealth in about 4 weeks.  This will  avoid the need to come to the office.  We spent more than 50% of our >25 min visit in face to face counseling regarding the above .

## 2019-07-09 DIAGNOSIS — M1712 Unilateral primary osteoarthritis, left knee: Secondary | ICD-10-CM | POA: Diagnosis not present

## 2019-07-09 DIAGNOSIS — M25672 Stiffness of left ankle, not elsewhere classified: Secondary | ICD-10-CM | POA: Diagnosis not present

## 2019-07-09 DIAGNOSIS — M25561 Pain in right knee: Secondary | ICD-10-CM | POA: Diagnosis not present

## 2019-07-09 DIAGNOSIS — M25562 Pain in left knee: Secondary | ICD-10-CM | POA: Diagnosis not present

## 2019-07-09 DIAGNOSIS — R262 Difficulty in walking, not elsewhere classified: Secondary | ICD-10-CM | POA: Diagnosis not present

## 2019-07-09 DIAGNOSIS — M545 Low back pain: Secondary | ICD-10-CM | POA: Diagnosis not present

## 2019-07-09 DIAGNOSIS — M6281 Muscle weakness (generalized): Secondary | ICD-10-CM | POA: Diagnosis not present

## 2019-07-09 NOTE — Telephone Encounter (Signed)
Called and talked to patient and Per Dr Jacqualyn Posey can get the voltaren gel over the counter and there is no prior authorization required for that. [name]

## 2019-07-10 ENCOUNTER — Other Ambulatory Visit: Payer: Self-pay | Admitting: Podiatry

## 2019-07-10 DIAGNOSIS — M779 Enthesopathy, unspecified: Secondary | ICD-10-CM

## 2019-07-10 NOTE — Progress Notes (Signed)
Subjective: 53 year old female presents the office today for concerns of pain to the left third and fourth toes.  Is been ongoing for approximately last 2 weeks.  She denies any recent injury to this area.  She has occasional swelling to the area.  She says the toenails on her left side, patient was made sure the nail beds are healthy.  Since I last saw her she has broken her ankle she underwent immobilization but no surgery.  Occasional swelling intermittently today otherwise doing well.  She is still on IVIG therapy and her strength is improving. Denies any systemic complaints such as fevers, chills, nausea, vomiting. No acute changes since last appointment, and no other complaints at this time.   Objective: AAO x3, NAD DP/PT pulses palpable bilaterally, CRT less than 3 seconds Overall her strength in her left lower extremity is much improved compared to what it was I last saw her.  There is discomfort mostly of the third and fourth toes there is no edema.  Minimal discomfort in the MPJs.  There is no pain on the metatarsals.  No other areas of tenderness identified today.  There is no nails present on the left side.  The nail beds appear to be healthy without any signs of infection. No open lesions or pre-ulcerative lesions.  Calluses present where she had a wound previously but there is no ulceration identified no signs of infection. No pain with calf compression, swelling, warmth, erythema  Assessment: Left foot pain, capsulitis; onycholysis  Plan: -All treatment options discussed with the patient including all alternatives, risks, complications.  -X-rays obtained reviewed.  Not able to identify any evidence of acute fracture. -I think her issues are coming from when she is walking.  She is trying to walk more strength and walking better and her gait is changing.  Discussed with graphite insert the right side of her shoe to help offload the MPJs of the digits.  She can use Voltaren gel as  needed.  Ice the area as well daily. -We will continue to monitor the toenails.  No signs of infection today.  Return in about 4 weeks (around 08/04/2019).  Trula Slade DPM  -Patient encouraged to call the office with any questions, concerns, change in symptoms.

## 2019-07-14 DIAGNOSIS — M25672 Stiffness of left ankle, not elsewhere classified: Secondary | ICD-10-CM | POA: Diagnosis not present

## 2019-07-14 DIAGNOSIS — M25562 Pain in left knee: Secondary | ICD-10-CM | POA: Diagnosis not present

## 2019-07-14 DIAGNOSIS — R262 Difficulty in walking, not elsewhere classified: Secondary | ICD-10-CM | POA: Diagnosis not present

## 2019-07-14 DIAGNOSIS — M25561 Pain in right knee: Secondary | ICD-10-CM | POA: Diagnosis not present

## 2019-07-14 DIAGNOSIS — M545 Low back pain: Secondary | ICD-10-CM | POA: Diagnosis not present

## 2019-07-14 DIAGNOSIS — M6281 Muscle weakness (generalized): Secondary | ICD-10-CM | POA: Diagnosis not present

## 2019-07-14 DIAGNOSIS — M1712 Unilateral primary osteoarthritis, left knee: Secondary | ICD-10-CM | POA: Diagnosis not present

## 2019-07-16 DIAGNOSIS — M25672 Stiffness of left ankle, not elsewhere classified: Secondary | ICD-10-CM | POA: Diagnosis not present

## 2019-07-16 DIAGNOSIS — R262 Difficulty in walking, not elsewhere classified: Secondary | ICD-10-CM | POA: Diagnosis not present

## 2019-07-16 DIAGNOSIS — M25561 Pain in right knee: Secondary | ICD-10-CM | POA: Diagnosis not present

## 2019-07-16 DIAGNOSIS — M545 Low back pain: Secondary | ICD-10-CM | POA: Diagnosis not present

## 2019-07-16 DIAGNOSIS — M25562 Pain in left knee: Secondary | ICD-10-CM | POA: Diagnosis not present

## 2019-07-16 DIAGNOSIS — M1712 Unilateral primary osteoarthritis, left knee: Secondary | ICD-10-CM | POA: Diagnosis not present

## 2019-07-16 DIAGNOSIS — M6281 Muscle weakness (generalized): Secondary | ICD-10-CM | POA: Diagnosis not present

## 2019-07-20 DIAGNOSIS — R262 Difficulty in walking, not elsewhere classified: Secondary | ICD-10-CM | POA: Diagnosis not present

## 2019-07-20 DIAGNOSIS — M6281 Muscle weakness (generalized): Secondary | ICD-10-CM | POA: Diagnosis not present

## 2019-07-20 DIAGNOSIS — M545 Low back pain: Secondary | ICD-10-CM | POA: Diagnosis not present

## 2019-07-20 DIAGNOSIS — M25562 Pain in left knee: Secondary | ICD-10-CM | POA: Diagnosis not present

## 2019-07-20 DIAGNOSIS — M1712 Unilateral primary osteoarthritis, left knee: Secondary | ICD-10-CM | POA: Diagnosis not present

## 2019-07-20 DIAGNOSIS — M25672 Stiffness of left ankle, not elsewhere classified: Secondary | ICD-10-CM | POA: Diagnosis not present

## 2019-07-20 DIAGNOSIS — M25561 Pain in right knee: Secondary | ICD-10-CM | POA: Diagnosis not present

## 2019-07-21 ENCOUNTER — Other Ambulatory Visit: Payer: Self-pay | Admitting: Gastroenterology

## 2019-07-21 DIAGNOSIS — M1712 Unilateral primary osteoarthritis, left knee: Secondary | ICD-10-CM | POA: Diagnosis not present

## 2019-07-21 DIAGNOSIS — M1711 Unilateral primary osteoarthritis, right knee: Secondary | ICD-10-CM | POA: Diagnosis not present

## 2019-07-22 ENCOUNTER — Telehealth: Payer: Self-pay

## 2019-07-22 DIAGNOSIS — I951 Orthostatic hypotension: Secondary | ICD-10-CM | POA: Diagnosis not present

## 2019-07-22 DIAGNOSIS — I129 Hypertensive chronic kidney disease with stage 1 through stage 4 chronic kidney disease, or unspecified chronic kidney disease: Secondary | ICD-10-CM | POA: Diagnosis not present

## 2019-07-22 DIAGNOSIS — N181 Chronic kidney disease, stage 1: Secondary | ICD-10-CM | POA: Diagnosis not present

## 2019-07-22 DIAGNOSIS — R809 Proteinuria, unspecified: Secondary | ICD-10-CM | POA: Diagnosis not present

## 2019-07-22 NOTE — Telephone Encounter (Signed)
Faxed Certification to [employer] at [phone].  Forms sent to be scanned.

## 2019-07-23 DIAGNOSIS — G6181 Chronic inflammatory demyelinating polyneuritis: Secondary | ICD-10-CM | POA: Diagnosis not present

## 2019-07-24 DIAGNOSIS — G6181 Chronic inflammatory demyelinating polyneuritis: Secondary | ICD-10-CM | POA: Diagnosis not present

## 2019-07-25 DIAGNOSIS — G6181 Chronic inflammatory demyelinating polyneuritis: Secondary | ICD-10-CM | POA: Diagnosis not present

## 2019-07-27 DIAGNOSIS — M25672 Stiffness of left ankle, not elsewhere classified: Secondary | ICD-10-CM | POA: Diagnosis not present

## 2019-07-27 DIAGNOSIS — M25562 Pain in left knee: Secondary | ICD-10-CM | POA: Diagnosis not present

## 2019-07-27 DIAGNOSIS — M25561 Pain in right knee: Secondary | ICD-10-CM | POA: Diagnosis not present

## 2019-07-27 DIAGNOSIS — M6281 Muscle weakness (generalized): Secondary | ICD-10-CM | POA: Diagnosis not present

## 2019-07-27 DIAGNOSIS — M545 Low back pain: Secondary | ICD-10-CM | POA: Diagnosis not present

## 2019-07-27 DIAGNOSIS — R262 Difficulty in walking, not elsewhere classified: Secondary | ICD-10-CM | POA: Diagnosis not present

## 2019-07-27 DIAGNOSIS — M1712 Unilateral primary osteoarthritis, left knee: Secondary | ICD-10-CM | POA: Diagnosis not present

## 2019-08-03 ENCOUNTER — Other Ambulatory Visit: Payer: Self-pay | Admitting: Gastroenterology

## 2019-08-04 ENCOUNTER — Ambulatory Visit (INDEPENDENT_AMBULATORY_CARE_PROVIDER_SITE_OTHER): Payer: BC Managed Care – PPO | Admitting: Podiatry

## 2019-08-04 ENCOUNTER — Encounter: Payer: Self-pay | Admitting: Podiatry

## 2019-08-04 ENCOUNTER — Other Ambulatory Visit: Payer: Self-pay

## 2019-08-04 VITALS — Temp 97.7°F

## 2019-08-04 DIAGNOSIS — G6181 Chronic inflammatory demyelinating polyneuritis: Secondary | ICD-10-CM

## 2019-08-04 DIAGNOSIS — L97521 Non-pressure chronic ulcer of other part of left foot limited to breakdown of skin: Secondary | ICD-10-CM

## 2019-08-04 DIAGNOSIS — M79675 Pain in left toe(s): Secondary | ICD-10-CM | POA: Diagnosis not present

## 2019-08-04 DIAGNOSIS — E1129 Type 2 diabetes mellitus with other diabetic kidney complication: Secondary | ICD-10-CM | POA: Diagnosis not present

## 2019-08-04 DIAGNOSIS — E1165 Type 2 diabetes mellitus with hyperglycemia: Secondary | ICD-10-CM | POA: Diagnosis not present

## 2019-08-04 DIAGNOSIS — Z4681 Encounter for fitting and adjustment of insulin pump: Secondary | ICD-10-CM | POA: Diagnosis not present

## 2019-08-04 DIAGNOSIS — E11621 Type 2 diabetes mellitus with foot ulcer: Secondary | ICD-10-CM | POA: Diagnosis not present

## 2019-08-04 DIAGNOSIS — Z794 Long term (current) use of insulin: Secondary | ICD-10-CM | POA: Diagnosis not present

## 2019-08-04 MED ORDER — MUPIROCIN 2 % EX OINT: 1.0000 "application " | TOPICAL_OINTMENT | Freq: Two times a day (BID) | CUTANEOUS | 2 refills | Status: DC

## 2019-08-05 ENCOUNTER — Telehealth: Payer: Self-pay

## 2019-08-05 DIAGNOSIS — M25672 Stiffness of left ankle, not elsewhere classified: Secondary | ICD-10-CM | POA: Diagnosis not present

## 2019-08-05 DIAGNOSIS — R262 Difficulty in walking, not elsewhere classified: Secondary | ICD-10-CM | POA: Diagnosis not present

## 2019-08-05 DIAGNOSIS — M545 Low back pain: Secondary | ICD-10-CM | POA: Diagnosis not present

## 2019-08-05 DIAGNOSIS — M6281 Muscle weakness (generalized): Secondary | ICD-10-CM | POA: Diagnosis not present

## 2019-08-05 DIAGNOSIS — M25562 Pain in left knee: Secondary | ICD-10-CM | POA: Diagnosis not present

## 2019-08-05 DIAGNOSIS — M25561 Pain in right knee: Secondary | ICD-10-CM | POA: Diagnosis not present

## 2019-08-05 DIAGNOSIS — M1712 Unilateral primary osteoarthritis, left knee: Secondary | ICD-10-CM | POA: Diagnosis not present

## 2019-08-05 NOTE — Telephone Encounter (Signed)

## 2019-08-06 ENCOUNTER — Telehealth (INDEPENDENT_AMBULATORY_CARE_PROVIDER_SITE_OTHER): Payer: BC Managed Care – PPO | Admitting: Internal Medicine

## 2019-08-06 ENCOUNTER — Encounter: Payer: Self-pay | Admitting: Internal Medicine

## 2019-08-06 DIAGNOSIS — I951 Orthostatic hypotension: Secondary | ICD-10-CM

## 2019-08-06 DIAGNOSIS — R55 Syncope and collapse: Secondary | ICD-10-CM | POA: Diagnosis not present

## 2019-08-06 MED ORDER — LISINOPRIL 10 MG PO TABS: 10.0000 mg | ORAL_TABLET | Freq: Every day | ORAL | 3 refills | Status: DC

## 2019-08-06 MED ORDER — METOPROLOL SUCCINATE ER 50 MG PO TB24: ORAL_TABLET | ORAL | 3 refills | Status: DC

## 2019-08-06 NOTE — Progress Notes (Signed)
Electrophysiology TeleHealth Note   Due to national recommendations of social distancing due to COVID 19, an audio/video telehealth visit is felt to be most appropriate for this patient at this time.  See MyChart message from today for the patient's consent to telehealth for [facility].   Date:  08/06/2019   ID:  [name], DOB January 15, 1966, MRN [id number]  Location: patient's home  Provider location: [address] [place]  Evaluation Performed: Follow-up visit  PCP:  Reynold Bowen, MD  Cardiologist:     Electrophysiologist:  SK   Chief Complaint:  Syncope and hypertension   History of Present Illness:    [name] is a 53 y.o. female who presents via audio/video conferencing for a telehealth visit today.  Since last being seen in our clinic for  recurrent syncope with hypertension, documented orthostatic hypotension in the context of diabetes with likely some degree of neuropathy;and CIDP  the patient reports *no interval falls.   Her BP after we decreased her BB and ACE at last visit ( sent and scanned intomedia) have been elevated; this was assoc with HA  Gradually they have improved  She saw [facility] last week and he changed her toprol 50 hs>>25 bid and suggested to increase her ACE Pt has been complaining of tachy-palpitations with exertion;  acknoweldges she is exerting herself much more than normal as she is not falling     HA are better   The patient denies symptoms of fevers, chills, cough, or new SOB worrisome for COVID 19.    Past Medical History:  Diagnosis Date  . Anginal pain ([place])    chest pain  heart cath done  . Anxiety   . Cancer [facility])    thyroid cancer  . Chronic bronchitis ([place])    "get it q yr"  . Chronic inflammatory demyelinating polyneuropathy ([place])   . Chronic kidney disease    Proteinuria  . Dyspnea    sob with exertion  . Early cataract    right  . Fibromyalgia   . GERD (gastroesophageal  reflux disease)   . Heart murmur   . Hepatic adenoma   . High cholesterol   . History of gout   . History of hiatal hernia   . History of kidney stones   . Hypertension   . Hypothyroidism   . IBS (irritable bowel syndrome)   . Iron deficiency anemia    "comes and goes"  . Migraine    "@ least once/month" (02/18/2015)  . Obesity   . PONV (postoperative nausea and vomiting)    patch and anti nausea meds work well  . Proteinuria   . Spinal headache   . Syncope   . Tachycardia   . Type II diabetes mellitus ([place])    pt has an insulin pump  . Wears glasses     Past Surgical History:  Procedure Laterality Date  . ABDOMINAL HYSTERECTOMY  ~2012   "lap"  . ANKLE RECONSTRUCTION Left 03/29/2016   Procedure: LEFT ANKLE LATERAL LIGAMENT RECONSTRUCTION AND PERONEAL TENDON REPAIR OR TENOLYSIS;  Surgeon: Wylene Simmer, MD;  Location: [place];  Service: Orthopedics;  Laterality: Left;  . BACK SURGERY    . CARDIAC CATHETERIZATION  04/2009   "clean"  . COLONOSCOPY  01/12/2015   Colonic polyps status post polypectomy, small internal hemorrhoids. Otherwise normal. Bx: mild chronic gastritis.  . CYSTOSCOPY WITH URETEROSCOPY, STONE BASKETRY AND STENT PLACEMENT  ~ 2006  . ESOPHAGOGASTRODUODENOSCOPY  07/21/2015  Esophageal stricture status post esophageal dilatation. Mild gastritis.  Marland Kitchen insulin pump     U 500  . IR IMAGING GUIDED PORT INSERTION  09/22/2018  . IRRIGATION AND DEBRIDEMENT KNEE Left 04/03/2018   Procedure: Arthroscopic versus open irrigation and debridement  left knee;  Surgeon: Nicholes Stairs, MD;  Location: [place];  Service: Orthopedics;  Laterality: Left;  . LAPAROSCOPIC CHOLECYSTECTOMY  05/2004  . LUMBAR DISC SURGERY  08/2011  . SURAL NERVE BX Left 09/30/2017   Procedure: LEFT SURAL NERVE AND MUSCLE BIOPSY;  Surgeon: Newman Pies, MD;  Location: [place];  Service: Neurosurgery;  Laterality: Left;  LEFT SURAL NERVE AND MUSCLE BIOPSY  . THYROIDECTOMY N/A 02/18/2015   Procedure:  TOTAL THYROIDECTOMY;  Surgeon: [name], MD;  Location: [place];  Service: General;  Laterality: N/A;  . TOTAL THYROIDECTOMY  02/18/2015    Current Outpatient Medications  Medication Sig Dispense Refill  . acetaminophen (TYLENOL) 500 MG tablet Take 1,000 mg by mouth every 6 (six) hours as needed for moderate pain or headache.     . ALPRAZolam (XANAX) 0.5 MG tablet Take 0.5 mg by mouth every 6 (six) hours as needed for anxiety or sleep.     Marland Kitchen ammonium lactate (LAC-HYDRIN) 12 % lotion Apply 1 application topically 2 (two) times daily.     Marland Kitchen aspirin EC 81 MG tablet Take 81 mg by mouth daily.    Marland Kitchen atorvastatin (LIPITOR) 20 MG tablet Take 20 mg by mouth daily at 6 PM.     . b complex vitamins tablet Take 1 tablet by mouth daily.    . Biotin 10000 MCG TABS Take 10,000 mcg by mouth at bedtime.    . clobetasol cream (TEMOVATE) [id number] % Apply 1 application topically 2 (two) times daily.    . clotrimazole-betamethasone (LOTRISONE) lotion Apply 1 application topically 2 (two) times daily as needed (for irritation).   2  . cyclobenzaprine (FLEXERIL) 10 MG tablet Take 10 mg by mouth See admin instructions. Take 10 mg by mouth at bedtime. Take 10 mg by mouth every 8 hours as needed for muscle spasms    . D3-50 50000 units capsule Take 50,000 Units by mouth every Saturday.  4  . Dermatological Products, Misc. (HYLATOPIC PLUS) CREA Apply 1 application topically 3 (three) times daily.     Marland Kitchen dexlansoprazole (DEXILANT) 60 MG capsule Take 60 mg by mouth daily at 6 (six) AM.     . diclofenac sodium (VOLTAREN) 1 % GEL Apply 2 g topically 4 (four) times daily. Rub into affected area of foot 2 to 4 times daily 100 g 2  . dicyclomine (BENTYL) 10 MG capsule TAKE 1 CAPSULE BY MOUTH 4 TIMES DAILY BEFORE MEAL(S) AND AT BEDTIME 90 capsule 0  . doxycycline (PERIOSTAT) 20 MG tablet Take 20 mg by mouth 2 (two) times daily.   3  . DULoxetine (CYMBALTA) 30 MG capsule Take 30 mg by mouth daily.    . fluticasone (FLONASE) 50  MCG/ACT nasal spray Place 1 spray into both nostrils at bedtime.     . furosemide (LASIX) 40 MG tablet Take 40 mg by mouth daily. As needed for SOB or leg swelling    . gabapentin (NEURONTIN) 300 MG capsule Take 2 capsules (600 mg total) by mouth 3 (three) times daily. 180 capsule 1  . halobetasol (ULTRAVATE) 0.05 % cream Apply 1 application topically 3 (three) times daily as needed (for finger blistering).     Marland Kitchen HYDROcodone-acetaminophen (NORCO) 7.5-325 MG tablet Take  1-2 tablets by mouth every 6 (six) hours as needed for moderate pain (for pain.). 40 tablet 0  . hyoscyamine (ANASPAZ) 0.125 MG TBDP disintergrating tablet Place 0.25 tablets under the tongue 3 (three) times daily with meals.     . hyoscyamine (LEVSIN SL) 0.125 MG SL tablet DISSOLVE 1 TABLET UNDER THE TONGUE EVERY 6 HOURS AS NEEDED 120 tablet 0  . immune globulin, human, (GAMMAGARD S/D LESS IGA) 10 g injection Inject 60 g into the vein every 21 ( twenty-one) days.    . Insulin Human (INSULIN PUMP) SOLN Inject into the skin.    Marland Kitchen insulin regular human CONCENTRATED (HUMULIN R) 500 UNIT/ML injection Inject 2 Units into the skin See admin instructions. 2.0 units /hour 0500-2359, 1.95units [id number] with carb ratio 1:50 for meal time bolus    . INVOKAMET 50-1000 MG TABS Take 1 tablet by mouth 2 (two) times daily.   5  . ketoconazole (NIZORAL) 2 % cream Apply 1 application topically 2 (two) times daily as needed (for toes).     Marland Kitchen levothyroxine (SYNTHROID, LEVOTHROID) 137 MCG tablet Take 274 mcg by mouth daily before breakfast.    . lisinopril (ZESTRIL) 10 MG tablet Take 1 tablet (10 mg total) by mouth daily. (Patient taking differently: Take 20 mg by mouth daily. Takes at night time) 90 tablet 3  . metoprolol succinate (TOPROL-XL) 50 MG 24 hr tablet Take 1 tablet (50 mg total) by mouth daily. Take with or immediately following a meal. (Patient taking differently: Take 50 mg by mouth daily. Take with or immediately following a meal.; pt stated;  "I take 25 mg in the morning and 25 mg at night time") 90 tablet 3  . metroNIDAZOLE (METROCREAM) 0.75 % cream Apply 1 application topically 2 (two) times daily.     . Multiple Vitamin (MULTIVITAMIN) tablet Take 1 tablet by mouth at bedtime.     . mupirocin ointment (BACTROBAN) 2 % Apply 1 application topically 2 (two) times daily. 30 g 2  . ondansetron (ZOFRAN-ODT) 8 MG disintegrating tablet Take 1 tablet (8 mg total) by mouth See admin instructions. Take 8 mg by mouth 3 times daily before meals. Take 8 mg by mouth every 6 hours as needed for nausea and vomiting. 45 tablet 2  . PROCTOSOL HC 2.5 % rectal cream Apply 1 application topically 2 (two) times daily as needed for hemorrhoids.   2  . triamcinolone ointment (KENALOG) 0.1 % Apply 1 application topically 2 (two) times daily as needed (for finger blister).     . vitamin B-12 (CYANOCOBALAMIN) 1000 MCG tablet Take 1,000 mcg by mouth daily.     No current facility-administered medications for this visit.     Allergies:   Penicillins, Clarithromycin, Other, Adhesive [tape], Latex, and Moxifloxacin   Social History:  The patient  reports that she has never smoked. She has never used smokeless tobacco. She reports that she does not drink alcohol or use drugs.   Family History:  [name]   family history includes AAA (abdominal aortic aneurysm) in her mother; Cancer - Prostate in her father; Coronary artery disease in her father; Emphysema in her mother; Hypertension in her brother, father, and mother; Renal Disease in her father.   ROS:  Please see the history of present illness.   All other systems are personally reviewed and negative.    Exam:    Vital Signs:  BP (!) 156/97 (BP Location: Right Arm, Patient Position: Sitting, Cuff Size: Normal)   Pulse Marland Kitchen)  112   Wt 281 lb (127.5 kg)   BMI 45.35 kg/m     Well appearing, alert and conversant, regular work of breathing,  good skin color Eyes- anicteric, neuro- grossly intact, skin- no  apparent rash or lesions or cyanosis, mouth- oral mucosa is pink   Labs/Other Tests and Data Reviewed:    Recent Labs: 09/22/2018: Hemoglobin 14.1; Platelets 177   Wt Readings from Last 3 Encounters:  08/06/19 281 lb (127.5 kg)  07/08/19 285 lb (129.3 kg)  06/22/19 285 lb (129.3 kg)     Other studies personally reviewed: Additional studies/ records that were reviewed today include: As above          ASSESSMENT & PLAN:    Orthostatic hypotension  Syncope  Diabetes  Morbid obesity  CIDP ( chronic inflammatory demyelinating polyneuropathy)  Hypertension     BP increased following the adjustments at the last visit, HR also increased but no further falls,  Given her exertional palpitations will increase the metoprolol 25 bid >> 50/25 to see if we can attenuate without precipitatiing hypotension.  She is terribly deconditioned and horizontal exercise or water aerobics could be very useful   She is to see Dr Hollie Salk in renal next week --will allow her to recheck BP   COVID 19 screen The patient denies symptoms of COVID 19 at this time.  The importance of social distancing was discussed today.  Follow-up:  2-3 m telehealth visit      Current medicines are reviewed at length with the patient today.   The patient does not have concerns regarding her medicines.  The following changes were made today:   Increase metoprolol as above  Labs/ tests ordered today include:   No orders of the defined types were placed in this encounter.   Future tests ( post COVID )     Patient Risk:  after full review of this patients clinical status, I feel that they are at moderate  risk at this time.  Today, I have spent 14* minutes with the patient with telehealth technology discussing the above.  We spent more than 50% of our >25 min visit in face to face counseling regarding the above   Signed, Virl Axe, MD  08/06/2019 2:41 PM     [facility] [address]  [place] [address] [phone] (office) [phone] (fax)

## 2019-08-06 NOTE — Patient Instructions (Signed)
Medication Instructions: Your physician has recommended you make the following change in your medication: Change your Lisinopril to 10 mg at bedtime, take your Metoprolol 50mg  in the morning and 25 mg at bedtime.   If you need a refill on your cardiac medications before your next appointment, please call your pharmacy.   Lab work: None ordered If you have labs (blood work) drawn today and your tests are completely normal, you will receive your results only by: Marland Kitchen MyChart Message (if you have MyChart) OR . A paper copy in the mail If you have any lab test that is abnormal or we need to change your treatment, we will call you to review the results.  Testing/Procedures: None ordered  Follow-Up: At [facility], you and your health needs are our priority.  As part of our continuing mission to provide you with exceptional heart care, we have created designated Provider Care Teams.  These Care Teams include your primary Cardiologist (physician) and Advanced Practice Providers (APPs -  Physician Assistants and Nurse Practitioners) who all work together to provide you with the care you need, when you need it. You will need a follow up appointment in 3 months.  Please call our office 2 months in advance to schedule this appointment.  You will have a Televisit with Dr.Klein  or one of the following Advanced Practice Providers on your designated Care Team:   Chanetta Marshall, NP . Tommye Standard, PA-C  Any Other Special Instructions Will Be Listed Below (If Applicable). Keep appt with Dr. Hollie Salk 08/14/19 so he can check your BP.

## 2019-08-09 NOTE — Progress Notes (Signed)
Subjective: 53 year old female presents the office today for further evaluation of pain to her toes on the left side and she also states that the wound really left big toe after a piece of skin came off.  She denies any drainage or pus.  They have been keeping the wound clean.  Denies any redness or red streaks.  Also she has been complaining of cramping to her back.  She does take Flexeril for this.Denies any systemic complaints such as fevers, chills, nausea, vomiting. No acute changes since last appointment, and no other complaints at this time.   Objective: AAO x3, NAD DP/PT pulses palpable bilaterally, CRT less than 3 seconds Tenderness mostly on the third and fourth digits.  There is no edema, erythema.  No significant pain to the metatarsals.  Ulceration which is superficial on the plantar aspect left hallux.  It appears a piece of skin got pulled off.  There is a granular wound base.  There is no significant edema, erythema, drainage of pus or any clinical signs of infection.  No other areas of discomfort or open lesions identified. No pain with calf compression, swelling, warmth, erythema  Assessment: Left foot pain, left hallux ulceration  Plan: -All treatment options discussed with the patient including all alternatives, risks, complications.  -Graphite insert was applied to her shoe today to help offload the digits.  I did not estimate from compensation.  There is been no injury to the toes recently that she reports.  There is no swelling. -I really debrided some of the hyperkeratotic tissue on the left plantar hallux and debrided the wound utilizing a 312 with scalpel without any complications..  Continue antibiotic ointment dressing changes daily.  No signs of infection today but continue to monitor closely.  Offloading. -Monitor for any clinical signs or symptoms of infection and directed to call the office immediately should any occur or go to the ER.  Follow-up as scheduled or  sooner if needed.  Trula Slade DPM

## 2019-08-12 ENCOUNTER — Telehealth: Payer: Self-pay | Admitting: Internal Medicine

## 2019-08-12 MED ORDER — METOPROLOL SUCCINATE ER 25 MG PO TB24: 25.0000 mg | ORAL_TABLET | Freq: Every day | ORAL | 0 refills | Status: DC

## 2019-08-12 NOTE — Telephone Encounter (Signed)
Sent 25mg  TOPROL XL rx to pharmacy per pt request d/t her having difficulty splitting the 50mg  tabs.   Pt is aware to take 2 tabs in the morning to equal 50mg  and 1 tab at night to equal 25mg . She verbalized understanding.

## 2019-08-12 NOTE — Telephone Encounter (Signed)
Pt calling requesting a new Rx be sent to her pharmacy for metoprolol 25 mg tablets, instead of metoprolol 50 mg tablets, because pt states that it is hard for her to cut tablet in half, it keeps crumbling up. Please address

## 2019-08-12 NOTE — Telephone Encounter (Signed)
Yes it comes in a 25mg  tablet - you should be able to find it if you enter Toprol 25 under new orders.

## 2019-08-14 DIAGNOSIS — E118 Type 2 diabetes mellitus with unspecified complications: Secondary | ICD-10-CM | POA: Diagnosis not present

## 2019-08-14 DIAGNOSIS — E1149 Type 2 diabetes mellitus with other diabetic neurological complication: Secondary | ICD-10-CM | POA: Diagnosis not present

## 2019-08-14 DIAGNOSIS — E11621 Type 2 diabetes mellitus with foot ulcer: Secondary | ICD-10-CM | POA: Diagnosis not present

## 2019-08-14 DIAGNOSIS — N182 Chronic kidney disease, stage 2 (mild): Secondary | ICD-10-CM | POA: Diagnosis not present

## 2019-08-14 DIAGNOSIS — Z23 Encounter for immunization: Secondary | ICD-10-CM | POA: Diagnosis not present

## 2019-08-14 DIAGNOSIS — G901 Familial dysautonomia [Riley-Day]: Secondary | ICD-10-CM | POA: Diagnosis not present

## 2019-08-14 DIAGNOSIS — L97519 Non-pressure chronic ulcer of other part of right foot with unspecified severity: Secondary | ICD-10-CM | POA: Diagnosis not present

## 2019-08-14 DIAGNOSIS — I951 Orthostatic hypotension: Secondary | ICD-10-CM | POA: Diagnosis not present

## 2019-08-18 DIAGNOSIS — G6181 Chronic inflammatory demyelinating polyneuritis: Secondary | ICD-10-CM | POA: Diagnosis not present

## 2019-08-19 DIAGNOSIS — G6181 Chronic inflammatory demyelinating polyneuritis: Secondary | ICD-10-CM | POA: Diagnosis not present

## 2019-08-20 DIAGNOSIS — G6181 Chronic inflammatory demyelinating polyneuritis: Secondary | ICD-10-CM | POA: Diagnosis not present

## 2019-08-21 ENCOUNTER — Ambulatory Visit (INDEPENDENT_AMBULATORY_CARE_PROVIDER_SITE_OTHER): Payer: BLUE CROSS/BLUE SHIELD | Admitting: Podiatry

## 2019-08-21 ENCOUNTER — Other Ambulatory Visit: Payer: Self-pay

## 2019-08-21 ENCOUNTER — Encounter: Payer: Self-pay | Admitting: Podiatry

## 2019-08-21 DIAGNOSIS — E1140 Type 2 diabetes mellitus with diabetic neuropathy, unspecified: Secondary | ICD-10-CM

## 2019-08-21 DIAGNOSIS — M79675 Pain in left toe(s): Secondary | ICD-10-CM

## 2019-08-21 DIAGNOSIS — L03032 Cellulitis of left toe: Secondary | ICD-10-CM

## 2019-08-21 DIAGNOSIS — L97521 Non-pressure chronic ulcer of other part of left foot limited to breakdown of skin: Secondary | ICD-10-CM | POA: Diagnosis not present

## 2019-08-21 DIAGNOSIS — E1149 Type 2 diabetes mellitus with other diabetic neurological complication: Secondary | ICD-10-CM

## 2019-08-21 MED ORDER — CLINDAMYCIN HCL 300 MG PO CAPS: 300.0000 mg | ORAL_CAPSULE | Freq: Three times a day (TID) | ORAL | 2 refills | Status: DC

## 2019-08-21 NOTE — Patient Instructions (Signed)
Start the clindamycin Keep a small amount of antibiotic ointment on the toe daily. Cover with gauze. I would hold off on a band-aid on the skin.  Have a good weekend.

## 2019-08-23 NOTE — Progress Notes (Signed)
Subjective: 53 year old female presents the office today for follow-up evaluation of her left hallux ulceration.  She states that it is looking "okay".  She has been keeping antibiotic ointment on the area daily.  Some minimal discomfort.  Denies any drainage or pus.  She is been keeping Voltaren gel on the toes which is been helpful.  She is also been wearing the graphite insert. Denies any systemic complaints such as fevers, chills, nausea, vomiting. No acute changes since last appointment, and no other complaints at this time.   Objective: AAO x3, NAD DP/PT pulses palpable bilaterally, CRT less than 3 seconds Ulceration plantar aspect hallux with hyperkeratotic tissue.  After debridement today the wound measures 0.5 x 0.5 x 0.1 cm.  Along the medial aspect there is moderate discomfort this is where there is some mild redness.  There is no fluctuation crepitation.  No ascending cellulitis.  No malodor. Metatarsals are any tenderness to palpation of the other digits today.  No other open lesions. No pain with calf compression, swelling, warmth, erythema  Assessment: Left hallux ulceration with mild erythema; resolving digital pain  Plan: -All treatment options discussed with the patient including all alternatives, risks, complications.  -Debrided the wound today utilizing a 312 with scalpel without any complications or bleeding.  Continue antibiotic ointment dressing changes.  Prescribed clindamycin given mild erythema.  Continue offloading at all times. Monitor for any clinical signs or symptoms of infection and directed to call the office immediately should any occur or go to the ER. -Continue Voltaren gel for the other digits as well as graphite insert as this seems to be helping. -Patient encouraged to call the office with any questions, concerns, change in symptoms.   Return in about 10 days (around 08/31/2019).  Trula Slade DPM

## 2019-08-24 NOTE — Telephone Encounter (Signed)
noted 

## 2019-08-31 ENCOUNTER — Encounter: Payer: Self-pay | Admitting: Podiatry

## 2019-08-31 ENCOUNTER — Ambulatory Visit (INDEPENDENT_AMBULATORY_CARE_PROVIDER_SITE_OTHER): Payer: BLUE CROSS/BLUE SHIELD | Admitting: Podiatry

## 2019-08-31 ENCOUNTER — Ambulatory Visit (INDEPENDENT_AMBULATORY_CARE_PROVIDER_SITE_OTHER): Payer: BLUE CROSS/BLUE SHIELD

## 2019-08-31 ENCOUNTER — Other Ambulatory Visit: Payer: Self-pay

## 2019-08-31 DIAGNOSIS — M84375A Stress fracture, left foot, initial encounter for fracture: Secondary | ICD-10-CM

## 2019-08-31 DIAGNOSIS — L97521 Non-pressure chronic ulcer of other part of left foot limited to breakdown of skin: Secondary | ICD-10-CM | POA: Diagnosis not present

## 2019-08-31 DIAGNOSIS — E1140 Type 2 diabetes mellitus with diabetic neuropathy, unspecified: Secondary | ICD-10-CM

## 2019-08-31 DIAGNOSIS — E1149 Type 2 diabetes mellitus with other diabetic neurological complication: Secondary | ICD-10-CM

## 2019-09-01 NOTE — Progress Notes (Signed)
Subjective: 53 year old female presents the office today for follow-up evaluation of her left hallux ulceration.  She states that overall the wound is doing better.  She is on the clindamycin does have some in her stomach and she is asking if she can stop.  Denies any drainage or pus.  She been keeping antibiotic ointment on the area daily.  She states that the toes are noticed painful however taking pain more to the ball of her foot.  No recent injury or trauma or any changes.  No significant swelling.  No redness or warmth. Denies any systemic complaints such as fevers, chills, nausea, vomiting. No acute changes since last appointment, and no other complaints at this time.   Objective: AAO x3, NAD DP/PT pulses palpable bilaterally, CRT less than 3 seconds Ulceration plantar aspect hallux with hyperkeratotic tissue.  After debridement today the wound measures 0.5 x 0.3 and is superficial.  There is no probing to bone, undermining or tunneling.  No surrounding erythema, ascending cellulitis.  No fluctuation crepitation.  No malodor. No pain with calf compression, swelling, warmth, erythema  Assessment: Left hallux ulceration with improvement, metatarsalgia  Plan: -All treatment options discussed with the patient including all alternatives, risks, complications.  -X-rays obtained reviewed.  No evidence of acute fracture.  No evidence of acute osteomyelitis or soft tissue emphysema. -Debrided the wound today utilizing a 312 with scalpel without any complications or bleeding.  Continue antibiotic ointment dressing changes daily.  She is to hold off on the clindamycin as upsetting her stomach and clinically is much improved.  Monitor closely for any signs or symptoms of infection. -A metatarsal pad with a graphite insert.  Also she had an insert type issue that was not fitting with a graphite insert.  I want her to wear the insert came with a shoe.  Return in about 2 weeks (around  09/14/2019).  Trula Slade DPM

## 2019-09-02 DIAGNOSIS — I951 Orthostatic hypotension: Secondary | ICD-10-CM | POA: Diagnosis not present

## 2019-09-02 DIAGNOSIS — G6181 Chronic inflammatory demyelinating polyneuritis: Secondary | ICD-10-CM | POA: Diagnosis not present

## 2019-09-02 DIAGNOSIS — G901 Familial dysautonomia [Riley-Day]: Secondary | ICD-10-CM | POA: Diagnosis not present

## 2019-09-02 DIAGNOSIS — N182 Chronic kidney disease, stage 2 (mild): Secondary | ICD-10-CM | POA: Diagnosis not present

## 2019-09-07 ENCOUNTER — Other Ambulatory Visit: Payer: Self-pay | Admitting: Gastroenterology

## 2019-09-07 DIAGNOSIS — Z01419 Encounter for gynecological examination (general) (routine) without abnormal findings: Secondary | ICD-10-CM | POA: Diagnosis not present

## 2019-09-07 DIAGNOSIS — Z1239 Encounter for other screening for malignant neoplasm of breast: Secondary | ICD-10-CM | POA: Diagnosis not present

## 2019-09-07 DIAGNOSIS — G6181 Chronic inflammatory demyelinating polyneuritis: Secondary | ICD-10-CM | POA: Diagnosis not present

## 2019-09-08 DIAGNOSIS — G6181 Chronic inflammatory demyelinating polyneuritis: Secondary | ICD-10-CM | POA: Diagnosis not present

## 2019-09-09 ENCOUNTER — Other Ambulatory Visit: Payer: Self-pay | Admitting: Gastroenterology

## 2019-09-09 DIAGNOSIS — G6181 Chronic inflammatory demyelinating polyneuritis: Secondary | ICD-10-CM | POA: Diagnosis not present

## 2019-09-14 ENCOUNTER — Ambulatory Visit (INDEPENDENT_AMBULATORY_CARE_PROVIDER_SITE_OTHER): Payer: Medicare Other | Admitting: Podiatry

## 2019-09-14 ENCOUNTER — Encounter: Payer: Self-pay | Admitting: Podiatry

## 2019-09-14 ENCOUNTER — Other Ambulatory Visit: Payer: Self-pay

## 2019-09-14 DIAGNOSIS — M779 Enthesopathy, unspecified: Secondary | ICD-10-CM

## 2019-09-14 DIAGNOSIS — L97521 Non-pressure chronic ulcer of other part of left foot limited to breakdown of skin: Secondary | ICD-10-CM | POA: Diagnosis not present

## 2019-09-14 DIAGNOSIS — E1149 Type 2 diabetes mellitus with other diabetic neurological complication: Secondary | ICD-10-CM | POA: Diagnosis not present

## 2019-09-14 DIAGNOSIS — E1140 Type 2 diabetes mellitus with diabetic neuropathy, unspecified: Secondary | ICD-10-CM | POA: Diagnosis not present

## 2019-09-18 ENCOUNTER — Encounter: Payer: Self-pay | Admitting: Neurology

## 2019-09-22 DIAGNOSIS — M1712 Unilateral primary osteoarthritis, left knee: Secondary | ICD-10-CM | POA: Diagnosis not present

## 2019-09-22 DIAGNOSIS — M25552 Pain in left hip: Secondary | ICD-10-CM | POA: Diagnosis not present

## 2019-09-22 DIAGNOSIS — M1711 Unilateral primary osteoarthritis, right knee: Secondary | ICD-10-CM | POA: Diagnosis not present

## 2019-09-25 DIAGNOSIS — E119 Type 2 diabetes mellitus without complications: Secondary | ICD-10-CM | POA: Diagnosis not present

## 2019-09-30 DIAGNOSIS — N182 Chronic kidney disease, stage 2 (mild): Secondary | ICD-10-CM | POA: Diagnosis not present

## 2019-10-05 NOTE — Progress Notes (Signed)
Subjective: 53 year old female presents the office today for follow-up evaluation of her left hallux ulceration.  She states the toe is doing better.  She did scrape her second toe on the bedpost.  Otherwise no to pain in the toes is improving as well.  She has no other concerns today.  She denies any redness or drainage or any swelling to the wounds. Denies any systemic complaints such as fevers, chills, nausea, vomiting. No acute changes since last appointment, and no other complaints at this time.   Objective: AAO x3, NAD DP/PT pulses palpable bilaterally, CRT less than 3 seconds Ulceration plantar aspect hallux with hyperkeratotic tissue.  Upon debridement there is still a small superficial granular wound present appears to resolve however there is a superficial abrasion on the second toe.  There is no surrounding erythema, ascending cellulitis there is no drainage or pus or any clinical signs of infection noted today.  Pain to the digits as well as submetatarsal area has improved and there is no significant tenderness today. No pain with calf compression, swelling, warmth, erythema  Assessment: Left hallux ulceration with improvement,   Plan: -All treatment options discussed with the patient including all alternatives, risks, complications.  -Debrided the wound today utilizing a 312 with scalpel without any complications or bleeding.  Continue antibiotic ointment dressing changes daily.  Continue antibiotic ointment on the second toe as well. -Continue graphite insert, metatarsal pad.  Return in about 3 weeks (around 10/05/2019).  Trula Slade DPM

## 2019-10-06 ENCOUNTER — Ambulatory Visit: Payer: Self-pay | Admitting: Podiatry

## 2019-10-08 ENCOUNTER — Telehealth (INDEPENDENT_AMBULATORY_CARE_PROVIDER_SITE_OTHER): Payer: Medicare Other | Admitting: Internal Medicine

## 2019-10-08 ENCOUNTER — Other Ambulatory Visit: Payer: Self-pay

## 2019-10-08 VITALS — BP 122/89 | HR 95 | Ht 66.0 in | Wt 285.0 lb

## 2019-10-08 DIAGNOSIS — R5381 Other malaise: Secondary | ICD-10-CM | POA: Diagnosis not present

## 2019-10-08 DIAGNOSIS — I951 Orthostatic hypotension: Secondary | ICD-10-CM

## 2019-10-08 DIAGNOSIS — R002 Palpitations: Secondary | ICD-10-CM | POA: Diagnosis not present

## 2019-10-08 NOTE — Progress Notes (Signed)
Electrophysiology TeleHealth Note   Due to national recommendations of social distancing due to COVID 19, an audio/video telehealth visit is felt to be most appropriate for this patient at this time.  See MyChart message from today for the patient's consent to telehealth for [facility].   Date:  10/08/2019   ID:  [name], DOB Oct 25, 1966, MRN [id number]  Location: patient's home  Provider location: [address] [place]  Evaluation Performed: Follow-up visit  PCP:  Reynold Bowen, MD  Cardiologist:     Electrophysiologist:  SK   Chief Complaint:  Syncope and hypertension   History of Present Illness:    [name] is a 53 y.o. female who presents via audio/video conferencing for a telehealth visit today.  Since last being seen in our clinic for  recurrent syncope with hypertension, documented orthostatic hypotension in the context of diabetes with likely some degree of neuropathy;and CIDP  the patient reports no interval falls and one episode of syncope   It occurred without warning but like most others occurred shortly after standing This is one episode in the last 3+ months.  She cannot recall a time when the frequency has been so low.   Her BP after we decreased her BB and ACE at last visit ( sent and scanned intomedia) have been elevated; this was assoc with HA  Gradually they have improved  She saw JD-renal last week and he changed her toprol 50 hs>>25 bid and suggested to increase her ACE  She is using a lift chair.  She is able to walk and stand without excessive tachypalpitations states that she feels like her heart is NOT  "beating out of her chest " still she has tachypalpitations and accompanying shortness of breath. Dr Hollie Salk suggested increasing her BB   The patient denies symptoms of fevers, chills, cough, or new SOB worrisome for COVID 19.    Past Medical History:  Diagnosis Date  . Anginal pain ([place])    chest pain   heart cath done  . Anxiety   . Cancer [facility])    thyroid cancer  . Chronic bronchitis ([place])    "get it q yr"  . Chronic inflammatory demyelinating polyneuropathy ([place])   . Chronic kidney disease    Proteinuria  . Dyspnea    sob with exertion  . Early cataract    right  . Fibromyalgia   . GERD (gastroesophageal reflux disease)   . Heart murmur   . Hepatic adenoma   . High cholesterol   . History of gout   . History of hiatal hernia   . History of kidney stones   . Hypertension   . Hypothyroidism   . IBS (irritable bowel syndrome)   . Iron deficiency anemia    "comes and goes"  . Migraine    "@ least once/month" (02/18/2015)  . Obesity   . PONV (postoperative nausea and vomiting)    patch and anti nausea meds work well  . Proteinuria   . Spinal headache   . Syncope   . Tachycardia   . Type II diabetes mellitus ([place])    pt has an insulin pump  . Wears glasses     Past Surgical History:  Procedure Laterality Date  . ABDOMINAL HYSTERECTOMY  ~2012   "lap"  . ANKLE RECONSTRUCTION Left 03/29/2016   Procedure: LEFT ANKLE LATERAL LIGAMENT RECONSTRUCTION AND PERONEAL TENDON REPAIR OR TENOLYSIS;  Surgeon: Wylene Simmer, MD;  Location: [place];  Service: Orthopedics;  Laterality: Left;  . BACK SURGERY    . CARDIAC CATHETERIZATION  04/2009   "clean"  . COLONOSCOPY  01/12/2015   Colonic polyps status post polypectomy, small internal hemorrhoids. Otherwise normal. Bx: mild chronic gastritis.  . CYSTOSCOPY WITH URETEROSCOPY, STONE BASKETRY AND STENT PLACEMENT  ~ 2006  . ESOPHAGOGASTRODUODENOSCOPY  07/21/2015   Esophageal stricture status post esophageal dilatation. Mild gastritis.  Marland Kitchen insulin pump     U 500  . IR IMAGING GUIDED PORT INSERTION  09/22/2018  . IRRIGATION AND DEBRIDEMENT KNEE Left 04/03/2018   Procedure: Arthroscopic versus open irrigation and debridement  left knee;  Surgeon: Nicholes Stairs, MD;  Location: [place];  Service: Orthopedics;  Laterality: Left;  .  LAPAROSCOPIC CHOLECYSTECTOMY  05/2004  . LUMBAR DISC SURGERY  08/2011  . SURAL NERVE BX Left 09/30/2017   Procedure: LEFT SURAL NERVE AND MUSCLE BIOPSY;  Surgeon: Newman Pies, MD;  Location: [place];  Service: Neurosurgery;  Laterality: Left;  LEFT SURAL NERVE AND MUSCLE BIOPSY  . THYROIDECTOMY N/A 02/18/2015   Procedure: TOTAL THYROIDECTOMY;  Surgeon: [name], MD;  Location: [place];  Service: General;  Laterality: N/A;  . TOTAL THYROIDECTOMY  02/18/2015    Current Outpatient Medications  Medication Sig Dispense Refill  . acetaminophen (TYLENOL) 500 MG tablet Take 1,000 mg by mouth every 6 (six) hours as needed for moderate pain or headache.     . ALPRAZolam (XANAX) 0.5 MG tablet Take 0.5 mg by mouth every 6 (six) hours as needed for anxiety or sleep.     Marland Kitchen ammonium lactate (LAC-HYDRIN) 12 % lotion Apply 1 application topically 2 (two) times daily.     Marland Kitchen aspirin EC 81 MG tablet Take 81 mg by mouth daily.    Marland Kitchen atorvastatin (LIPITOR) 20 MG tablet Take 20 mg by mouth daily at 6 PM.     . b complex vitamins tablet Take 1 tablet by mouth daily.    . Biotin 10000 MCG TABS Take 10,000 mcg by mouth at bedtime.    . clindamycin (CLEOCIN) 300 MG capsule Take 1 capsule (300 mg total) by mouth 3 (three) times daily. 30 capsule 2  . clobetasol cream (TEMOVATE) [id number] % Apply 1 application topically 2 (two) times daily.    . clotrimazole-betamethasone (LOTRISONE) lotion Apply 1 application topically 2 (two) times daily as needed (for irritation).   2  . cyclobenzaprine (FLEXERIL) 10 MG tablet Take 10 mg by mouth See admin instructions. Take 10 mg by mouth at bedtime. Take 10 mg by mouth every 8 hours as needed for muscle spasms    . D3-50 50000 units capsule Take 50,000 Units by mouth every Saturday.  4  . Dermatological Products, Misc. (HYLATOPIC PLUS) CREA Apply 1 application topically 3 (three) times daily.     Marland Kitchen DEXILANT 60 MG capsule Take 1 capsule by mouth once daily 90 capsule 0  . diclofenac  sodium (VOLTAREN) 1 % GEL Apply 2 g topically 4 (four) times daily. Rub into affected area of foot 2 to 4 times daily 100 g 2  . dicyclomine (BENTYL) 10 MG capsule TAKE 1 CAPSULE BY MOUTH 4 TIMES DAILY BEFORE MEAL(S) AND AT BEDTIME 90 capsule 0  . doxycycline (PERIOSTAT) 20 MG tablet Take 20 mg by mouth 2 (two) times daily.   3  . DULoxetine (CYMBALTA) 30 MG capsule Take 30 mg by mouth daily.    . fluticasone (FLONASE) 50 MCG/ACT nasal spray Place 1 spray into both nostrils at  bedtime.     . furosemide (LASIX) 40 MG tablet Take 40 mg by mouth daily. As needed for SOB or leg swelling    . gabapentin (NEURONTIN) 300 MG capsule Take 2 capsules (600 mg total) by mouth 3 (three) times daily. 180 capsule 1  . halobetasol (ULTRAVATE) 0.05 % cream Apply 1 application topically 3 (three) times daily as needed (for finger blistering).     Marland Kitchen HYDROcodone-acetaminophen (NORCO) 7.5-325 MG tablet Take 1-2 tablets by mouth every 6 (six) hours as needed for moderate pain (for pain.). 40 tablet 0  . hyoscyamine (LEVSIN SL) 0.125 MG SL tablet DISSOLVE 1 TABLET UNDER THE TONGUE EVERY 6 HOURS AS NEEDED 120 tablet 0  . immune globulin, human, (GAMMAGARD S/D LESS IGA) 10 g injection Inject 60 g into the vein every 21 ( twenty-one) days.    . Insulin Human (INSULIN PUMP) SOLN Inject into the skin.    Marland Kitchen insulin regular human CONCENTRATED (HUMULIN R) 500 UNIT/ML injection Inject 2 Units into the skin See admin instructions. 2.0 units /hour 0500-2359, 1.95units [id number] with carb ratio 1:50 for meal time bolus    . INVOKAMET 50-1000 MG TABS Take 1 tablet by mouth 2 (two) times daily.   5  . ketoconazole (NIZORAL) 2 % cream Apply 1 application topically 2 (two) times daily as needed (for toes).     Marland Kitchen levothyroxine (SYNTHROID, LEVOTHROID) 137 MCG tablet Take 274 mcg by mouth daily before breakfast.    . lisinopril (ZESTRIL) 20 MG tablet Take 20 mg by mouth daily.    . metoprolol succinate (TOPROL XL) 25 MG 24 hr tablet Take 1  tablet (25 mg total) by mouth daily. (Patient taking differently: Take 25 mg by mouth 2 (two) times daily. Take 2 tabs (50mg  total) in the morning Take 1 tab (25 mg) in the evening) 90 tablet 0  . metroNIDAZOLE (METROCREAM) 0.75 % cream Apply 1 application topically 2 (two) times daily.     . Multiple Vitamin (MULTIVITAMIN) tablet Take 1 tablet by mouth at bedtime.     . mupirocin ointment (BACTROBAN) 2 % Apply 1 application topically 2 (two) times daily. 30 g 2  . ondansetron (ZOFRAN-ODT) 8 MG disintegrating tablet Take 1 tablet (8 mg total) by mouth See admin instructions. Take 8 mg by mouth 3 times daily before meals. Take 8 mg by mouth every 6 hours as needed for nausea and vomiting. 45 tablet 2  . PROCTOSOL HC 2.5 % rectal cream Apply 1 application topically 2 (two) times daily as needed for hemorrhoids.   2  . triamcinolone ointment (KENALOG) 0.1 % Apply 1 application topically 2 (two) times daily as needed (for finger blister).     . vitamin B-12 (CYANOCOBALAMIN) 1000 MCG tablet Take 1,000 mcg by mouth daily.     No current facility-administered medications for this visit.     Allergies:   Penicillins, Clarithromycin, Other, Adhesive [tape], Latex, and Moxifloxacin   Social History:  The patient  reports that she has never smoked. She has never used smokeless tobacco. She reports that she does not drink alcohol or use drugs.   Family History:  [name]   family history includes AAA (abdominal aortic aneurysm) in her mother; Cancer - Prostate in her father; Coronary artery disease in her father; Emphysema in her mother; Hypertension in her brother, father, and mother; Renal Disease in her father.   ROS:  Please see the history of present illness.   All other systems are personally reviewed  and negative.    Exam:    Vital Signs:  BP 122/89   Pulse 95   Ht 5\' 6"  (1.676 m)   Wt 285 lb (129.3 kg)   BMI 46.00 kg/m     Well appearing, alert and conversant, regular work of  breathing,  good skin color Eyes- anicteric, neuro- grossly intact, skin- no apparent rash or lesions or cyanosis, mouth- oral mucosa is pink   Labs/Other Tests and Data Reviewed:    Recent Labs: No results found for requested labs within last 8760 hours.   Wt Readings from Last 3 Encounters:  10/08/19 285 lb (129.3 kg)  08/06/19 281 lb (127.5 kg)  07/08/19 285 lb (129.3 kg)     Other studies personally reviewed: Additional studies/ records that were reviewed today include: As above          ASSESSMENT & PLAN:    Orthostatic hypotension  Syncope  Diabetes  Morbid obesity  CIDP ( chronic inflammatory demyelinating polyneuropathy)  Hypertension   Will continue to try and decreaee BP meds so as to give her a greater orthostatic "cushion"  If BP  In am < 150  , lisinopril 20>>10  Will try and allow BP to rest higher   Will try again with thigh sleeves that have velcro bands that may make them more effective, this to obviate some of her orthostatic stress   Dr Hollie Salk wants to increase BB and this may be rightbut suspect much of this is deconidintioning and would like to try and do without meds if we can  Important to be out of bed as much as possible   Also stressed isometric contraction prior to standing     Follow-up:  2-3 m telehealth visit      Current medicines are reviewed at length with the patient today.   The patient does not  have  concerns regarding her medicines.  The following changes were made today:   None    Labs/ tests ordered today include:   No orders of the defined types were placed in this encounter.   Future tests ( post COVID )     Patient Risk:  after full review of this patients clinical status, I feel that they are at moderate  risk at this time.  Today, I have spent 14* minutes with the patient with telehealth technology discussing the above.  We spent more than 50% of our >25 min visit in face to face counseling regarding the  above   Signed, Virl Axe, MD  10/08/2019 1:57 PM     [address] [address] [phone] (office) [phone] (fax)

## 2019-10-13 ENCOUNTER — Ambulatory Visit: Payer: Self-pay | Admitting: Podiatry

## 2019-10-23 ENCOUNTER — Telehealth: Payer: Self-pay | Admitting: Neurology

## 2019-10-23 NOTE — Telephone Encounter (Signed)
Patient is calling in about new insurance (medicare only) for her IVIG infusions for her CIDP - they are looking for a specific code and she is needing a prior auth for the visits now. Someone is supposed to be reaching out and she just wanted yall to know. Thanks!

## 2019-10-23 NOTE — Telephone Encounter (Signed)
Sorry I am not sure what this is about per note someone will be calling about this. Wait for call they will be able to assist you in this.

## 2019-10-23 NOTE — Telephone Encounter (Signed)
[employer] can you give me some insite to this?

## 2019-10-26 ENCOUNTER — Telehealth: Payer: Self-pay | Admitting: Neurology

## 2019-10-26 NOTE — Telephone Encounter (Signed)
Last office note sent to [facility].  [phone]

## 2019-10-26 NOTE — Telephone Encounter (Signed)
Heather from [facility] was wanting the recent OV notes faxed to them at [phone]; AttnNira Conn. Thanks!

## 2019-11-15 ENCOUNTER — Other Ambulatory Visit: Payer: Self-pay | Admitting: Gastroenterology

## 2019-11-17 ENCOUNTER — Other Ambulatory Visit: Payer: Self-pay | Admitting: Gastroenterology

## 2019-11-19 ENCOUNTER — Telehealth: Payer: Self-pay | Admitting: Gastroenterology

## 2019-11-19 MED ORDER — HYOSCYAMINE SULFATE 0.125 MG SL SUBL: SUBLINGUAL_TABLET | SUBLINGUAL | 3 refills | Status: DC

## 2019-11-19 MED ORDER — DICYCLOMINE HCL 10 MG PO CAPS: ORAL_CAPSULE | ORAL | 11 refills | Status: DC

## 2019-11-19 NOTE — Telephone Encounter (Signed)
Sent prescription to patients pharmacy, patient notified.

## 2019-11-19 NOTE — Telephone Encounter (Signed)
This patient has not been seen since 05/2018, would you like to give refills?

## 2019-11-19 NOTE — Telephone Encounter (Signed)
Please fill-in dicyclomine as before, 11 refills Thx  RG

## 2019-12-23 ENCOUNTER — Ambulatory Visit: Payer: BC Managed Care – PPO | Admitting: Neurology

## 2019-12-23 ENCOUNTER — Encounter: Payer: Self-pay | Admitting: Neurology

## 2019-12-24 ENCOUNTER — Other Ambulatory Visit: Payer: Self-pay

## 2019-12-24 ENCOUNTER — Telehealth (INDEPENDENT_AMBULATORY_CARE_PROVIDER_SITE_OTHER): Payer: Medicare Other | Admitting: Neurology

## 2019-12-24 DIAGNOSIS — G8929 Other chronic pain: Secondary | ICD-10-CM | POA: Diagnosis not present

## 2019-12-24 DIAGNOSIS — G6181 Chronic inflammatory demyelinating polyneuritis: Secondary | ICD-10-CM | POA: Diagnosis not present

## 2019-12-24 DIAGNOSIS — M545 Low back pain, unspecified: Secondary | ICD-10-CM

## 2019-12-24 MED ORDER — GABAPENTIN 300 MG PO CAPS: 600.0000 mg | ORAL_CAPSULE | Freq: Three times a day (TID) | ORAL | 3 refills | Status: DC

## 2019-12-24 NOTE — Progress Notes (Signed)
   Virtual Visit via Video Note The purpose of this virtual visit is to provide medical care while limiting exposure to the novel coronavirus.    Consent was obtained for video visit:  Yes.   Answered questions that patient had about telehealth interaction:  Yes.   I discussed the limitations, risks, security and privacy concerns of performing an evaluation and management service by telemedicine. I also discussed with the patient that there may be a patient responsible charge related to this service. The patient expressed understanding and agreed to proceed.  Pt location: Home Physician Location: office Name of referring provider:  Reynold Bowen, MD I connected with Lakeasha M Roberts-Stadel at patients initiation/request on 12/24/2019 at  7:50 AM EST by video enabled telemedicine application and verified that I am speaking with the correct person using two identifiers. Pt MRN:  [id number] Pt DOB:  03-Jun-1966 Video Participants:  Beth M Roberts-Haver;  Shelly Flatten (husband)   History of Present Illness: This is a 54 y.o. female returning for follow-up of CIDP.  She continues to receive IVIG 1 mg/kg every 21 days and is tolerating this well.  She feels her neurological symptoms are stabilized on this regimen.  She denies any new weakness, numbness, or tingling.  She has suffered 6 falls since her last visit, these seem to be due to left foot drop.  She does not use ankle-foot orthotic, due to skin irritation.  She is ambulating with a walker and has recovered from her tibial fracture in the left leg.      Observations/Objective:   Patient is awake, alert, and appears comfortable.   Extraocular muscles are intact.  Face is symmetric.  Speech is not dysarthric.  Antigravity in all extremities.  No pronator drift. Finger tapping is slowed bilaterally.  Gait appears wide-based, assisted with walker and appears stable.  There is mild dragging of the left foot.   Assessment and Plan:  1.  Chronic  inflammatory polyradiculoneuropathy with distal weakness, sensory loss (worse on the left), and dysautonomia (09/2017).  Diagnosed by sural nerve biopsy (09/2017), and CSF was normal but electrodiagnostic testing was highly suggestive of a subacute polyneuropathy.  IVIG was started in February 2019 and she has been this every 3 weeks prior Port-A-Cath, with improvement in upper extremity and right left strength.  She continues to have left foot drop.  Overall, clinically stable.   -Continue IVIG 1 mg/kg over 3 days every 21 days  -Continue gabapentin 600mg  TID for painful paresthesias  -Urged patient to try to remain active with home exercises   2.  Chronic low back pain  - Continue flexeril 5-10mg  at bedtime    Follow Up Instructions:   I discussed the assessment and treatment plan with the patient. The patient was provided an opportunity to ask questions and all were answered. The patient agreed with the plan and demonstrated an understanding of the instructions.   The patient was advised to call back or seek an in-person evaluation if the symptoms worsen or if the condition fails to improve as anticipated.  Follow-up in 8 months   Alda Berthold, DO

## 2020-01-05 DIAGNOSIS — S8001XA Contusion of right knee, initial encounter: Secondary | ICD-10-CM | POA: Insufficient documentation

## 2020-01-10 DIAGNOSIS — I951 Orthostatic hypotension: Secondary | ICD-10-CM | POA: Insufficient documentation

## 2020-01-10 DIAGNOSIS — R55 Syncope and collapse: Secondary | ICD-10-CM | POA: Insufficient documentation

## 2020-01-12 ENCOUNTER — Encounter: Payer: Self-pay | Admitting: Internal Medicine

## 2020-01-12 ENCOUNTER — Other Ambulatory Visit: Payer: Self-pay

## 2020-01-12 ENCOUNTER — Telehealth (INDEPENDENT_AMBULATORY_CARE_PROVIDER_SITE_OTHER): Payer: Medicare Other | Admitting: Internal Medicine

## 2020-01-12 DIAGNOSIS — R55 Syncope and collapse: Secondary | ICD-10-CM

## 2020-01-12 DIAGNOSIS — I951 Orthostatic hypotension: Secondary | ICD-10-CM | POA: Diagnosis not present

## 2020-01-12 NOTE — Progress Notes (Signed)
Electrophysiology TeleHealth Note   Due to national recommendations of social distancing due to COVID 19, an audio/video telehealth visit is felt to be most appropriate for this patient at this time.  See MyChart message from today for the patient's consent to telehealth for [facility].   Date:  01/12/2020   ID:  [name], DOB 05/30/1966, MRN [id number]  Location: patient's home  Provider location: [address] [place]  Evaluation Performed: Follow-up visit  PCP:  Reynold Bowen, MD  Cardiologist:     Electrophysiologist:  SK   Chief Complaint:  Syncope and hypertension   History of Present Illness:    [name] is a 54 y.o. female who presents via audio/video conferencing for a telehealth visit today.  Since last being seen in our clinic for recurrent syncope with hypertension, documented orthostatic hypotension in the context of diabetes with likely some degree of neuropathy;and CIDP  the patient  Reports recurrent falls-- her BP meds have been adjusted by nephrology>> she has noted more LH and weakness following the lisinopril 20 mg   HR are also faster following the lisinopril 20 mg than the 10     Date Cr K Hgb  4/19 0.82 4.1 14.1 (10/19)     The patient denies symptoms of fevers, chills, cough, or new SOB worrisome for COVID 19.    Past Medical History:  Diagnosis Date  . Anginal pain ([place])    chest pain  heart cath done  . Anxiety   . Cancer [facility])    thyroid cancer  . Chronic bronchitis ([place])    "get it q yr"  . Chronic inflammatory demyelinating polyneuropathy ([place])   . Chronic kidney disease    Proteinuria  . Dyspnea    sob with exertion  . Early cataract    right  . Fibromyalgia   . GERD (gastroesophageal reflux disease)   . Heart murmur   . Hepatic adenoma   . High cholesterol   . History of gout   . History of hiatal hernia   . History of kidney stones   . Hypertension   . Hypothyroidism   .  IBS (irritable bowel syndrome)   . Iron deficiency anemia    "comes and goes"  . Migraine    "@ least once/month" (02/18/2015)  . Obesity   . PONV (postoperative nausea and vomiting)    patch and anti nausea meds work well  . Proteinuria   . Spinal headache   . Syncope   . Tachycardia   . Type II diabetes mellitus ([place])    pt has an insulin pump  . Wears glasses     Past Surgical History:  Procedure Laterality Date  . ABDOMINAL HYSTERECTOMY  ~2012   "lap"  . ANKLE RECONSTRUCTION Left 03/29/2016   Procedure: LEFT ANKLE LATERAL LIGAMENT RECONSTRUCTION AND PERONEAL TENDON REPAIR OR TENOLYSIS;  Surgeon: Wylene Simmer, MD;  Location: [facility];  Service: Orthopedics;  Laterality: Left;  . BACK SURGERY    . CARDIAC CATHETERIZATION  04/2009   "clean"  . COLONOSCOPY  01/12/2015   Colonic polyps status post polypectomy, small internal hemorrhoids. Otherwise normal. Bx: mild chronic gastritis.  . CYSTOSCOPY WITH URETEROSCOPY, STONE BASKETRY AND STENT PLACEMENT  ~ 2006  . ESOPHAGOGASTRODUODENOSCOPY  07/21/2015   Esophageal stricture status post esophageal dilatation. Mild gastritis.  Marland Kitchen insulin pump     U 500  . IR IMAGING GUIDED PORT INSERTION  09/22/2018  . IRRIGATION AND  DEBRIDEMENT KNEE Left 04/03/2018   Procedure: Arthroscopic versus open irrigation and debridement  left knee;  Surgeon: Nicholes Stairs, MD;  Location: [facility];  Service: Orthopedics;  Laterality: Left;  . LAPAROSCOPIC CHOLECYSTECTOMY  05/2004  . LUMBAR DISC SURGERY  08/2011  . SURAL NERVE BX Left 09/30/2017   Procedure: LEFT SURAL NERVE AND MUSCLE BIOPSY;  Surgeon: Newman Pies, MD;  Location: [facility];  Service: Neurosurgery;  Laterality: Left;  LEFT SURAL NERVE AND MUSCLE BIOPSY  . THYROIDECTOMY N/A 02/18/2015   Procedure: TOTAL THYROIDECTOMY;  Surgeon: [name], MD;  Location: [facility];  Service: General;  Laterality: N/A;  . TOTAL THYROIDECTOMY  02/18/2015    Current Outpatient Medications  Medication Sig Dispense  Refill  . acetaminophen (TYLENOL) 500 MG tablet Take 1,000 mg by mouth every 6 (six) hours as needed for moderate pain or headache.     . ALPRAZolam (XANAX) 0.5 MG tablet Take 0.5 mg by mouth every 6 (six) hours as needed for anxiety or sleep.     Marland Kitchen ammonium lactate (LAC-HYDRIN) 12 % lotion Apply 1 application topically 2 (two) times daily.     Marland Kitchen aspirin EC 81 MG tablet Take 81 mg by mouth daily.    Marland Kitchen atorvastatin (LIPITOR) 20 MG tablet Take 20 mg by mouth daily at 6 PM.     . b complex vitamins tablet Take 1 tablet by mouth daily.    Pleas Patricia Microlet Lancets lancets 4 each by Other route 4 (four) times daily.    . Biotin 10000 MCG TABS Take 10,000 mcg by mouth at bedtime.    . cetirizine (ZYRTEC) 10 MG tablet Take 10 mg by mouth at bedtime.    . clobetasol cream (TEMOVATE) [id number] % Apply 1 application topically 2 (two) times daily as needed.     . clotrimazole-betamethasone (LOTRISONE) lotion Apply 1 application topically 2 (two) times daily as needed (for irritation).   2  . cyclobenzaprine (FLEXERIL) 10 MG tablet Take 10 mg by mouth See admin instructions. Take 10 mg by mouth at bedtime. Take 10 mg by mouth every 8 hours as needed for muscle spasms    . D3-50 50000 units capsule Take 50,000 Units by mouth every Saturday.  4  . Dermatological Products, Misc. (HYLATOPIC PLUS) CREA Apply 1 application topically 3 (three) times daily.     . diclofenac sodium (VOLTAREN) 1 % GEL Apply 2 g topically 4 (four) times daily. Rub into affected area of foot 2 to 4 times daily 100 g 2  . dicyclomine (BENTYL) 10 MG capsule Take 10 mg by mouth 2 (two) times daily.    Marland Kitchen doxycycline (PERIOSTAT) 20 MG tablet Take 20 mg by mouth 2 (two) times daily.   3  . DULoxetine (CYMBALTA) 60 MG capsule Take 60 mg by mouth daily.    . fluticasone (FLONASE) 50 MCG/ACT nasal spray Place 1 spray into both nostrils at bedtime.     . furosemide (LASIX) 40 MG tablet Take 40 mg by mouth daily. As needed for SOB or leg swelling    .  gabapentin (NEURONTIN) 300 MG capsule Take 2 capsules (600 mg total) by mouth 3 (three) times daily. 540 capsule 3  . glucose blood (CONTOUR NEXT TEST) test strip 4 each by Other route 4 (four) times daily.    . halobetasol (ULTRAVATE) 0.05 % cream Apply 1 application topically 3 (three) times daily as needed (for finger blistering).     Marland Kitchen HYDROcodone-acetaminophen (NORCO) 7.5-325 MG tablet Take  1-2 tablets by mouth every 6 (six) hours as needed for moderate pain (for pain.). 40 tablet 0  . hyoscyamine (LEVSIN SL) 0.125 MG SL tablet DISSOLVE 1 TABLET UNDER THE TONGUE EVERY 6 HOURS AS NEEDED 120 tablet 3  . immune globulin, human, (GAMMAGARD S/D LESS IGA) 10 g injection Inject 40 g into the vein every 21 ( twenty-one) days. 40 mg daily for 3 days every 3 weeks    . Insulin Human (INSULIN PUMP) SOLN Inject into the skin.    Marland Kitchen insulin regular human CONCENTRATED (HUMULIN R) 500 UNIT/ML injection Inject 2 Units into the skin See admin instructions. 2.0 units /hour 0500-2359, 1.95units [id number] with carb ratio 1:50 for meal time bolus    . INVOKAMET 50-1000 MG TABS Take 1 tablet by mouth 2 (two) times daily.   5  . ketoconazole (NIZORAL) 2 % cream Apply 1 application topically 2 (two) times daily as needed (for toes).     Marland Kitchen levothyroxine (SYNTHROID, LEVOTHROID) 137 MCG tablet Take 274 mcg by mouth daily before breakfast.    . lisinopril (ZESTRIL) 20 MG tablet Take 20 mg by mouth daily.    . metoprolol succinate (TOPROL-XL) 50 MG 24 hr tablet Take 50 mg by mouth daily. Take with or immediately following a meal.    . metroNIDAZOLE (METROCREAM) 0.75 % cream Apply 1 application topically 2 (two) times daily.     . Multiple Vitamin (MULTIVITAMIN) tablet Take 1 tablet by mouth at bedtime.     . mupirocin ointment (BACTROBAN) 2 % Apply 1 application topically 2 (two) times daily. 30 g 2  . ondansetron (ZOFRAN-ODT) 8 MG disintegrating tablet Take 1 tablet (8 mg total) by mouth See admin instructions. Take 8 mg by  mouth 3 times daily before meals. Take 8 mg by mouth every 6 hours as needed for nausea and vomiting. 45 tablet 2  . pantoprazole (PROTONIX) 40 MG tablet Take 40 mg by mouth daily.    Marland Kitchen PROCTOSOL HC 2.5 % rectal cream Apply 1 application topically 2 (two) times daily as needed for hemorrhoids.   2  . triamcinolone ointment (KENALOG) 0.1 % Apply 1 application topically 2 (two) times daily as needed (for finger blister).      No current facility-administered medications for this visit.    Allergies:   Penicillins, Clarithromycin, Other, Adhesive [tape], Latex, and Moxifloxacin   Social History:  The patient  reports that she has never smoked. She has never used smokeless tobacco. She reports that she does not drink alcohol or use drugs.   Family History:  [name]   family history includes AAA (abdominal aortic aneurysm) in her mother; Cancer - Prostate in her father; Coronary artery disease in her father; Emphysema in her mother; Hypertension in her brother, father, and mother; Renal Disease in her father.   ROS:  Please see the history of present illness.   All other systems are personally reviewed and negative.    Exam:    Vital Signs:  BP (!) 106/50   Pulse (!) 110   Ht 5\' 6"  (1.676 m)   Wt 288 lb (130.6 kg)   BMI 46.48 kg/m         Labs/Other Tests and Data Reviewed:    Recent Labs: No results found for requested labs within last 8760 hours.   Wt Readings from Last 3 Encounters:  01/12/20 288 lb (130.6 kg)  12/23/19 286 lb (129.7 kg)  10/08/19 285 lb (129.3 kg)     Other  studies personally reviewed: Additional studies/ records that were reviewed today include: As above          ASSESSMENT & PLAN:    Orthostatic hypotension  Syncope  Diabetes  Morbid obesity  CIDP ( chronic inflammatory demyelinating polyneuropathy)  Hypertension   Her blood pressure continues to be an issue.  Lisinopril 20 seems to be associated with more symptoms of  lightheadedness tachycardia and 10 mg.  I will discuss with Dr. Hollie Salk whether we just let her blood pressure right a little higher and use the lisinopril 10 mg.  The other question is whether we might be able to use Mestinon.  Encouraged her to use isometric contraction, remember to stay out of bed.  Think about getting CuBII for exercise  Followup in 59m ,telehealth visit      Labs/ tests ordered today include:   No orders of the defined types were placed in this encounter.   Future tests ( post COVID )     Patient Risk:  after full review of this patients clinical status, I feel that they are at moderate  risk at this time.  Today, I have spent 12 minutes with the patient with telehealth technology discussing the above.  We spent more than  20 min visit in face to face counseling regarding the above   Signed, Virl Axe, MD  01/12/2020 2:37 PM     [facility] [address] [place] [address] [phone] (office) [phone] (fax)

## 2020-01-22 DIAGNOSIS — M65352 Trigger finger, left little finger: Secondary | ICD-10-CM | POA: Insufficient documentation

## 2020-02-04 DIAGNOSIS — S6990XA Unspecified injury of unspecified wrist, hand and finger(s), initial encounter: Secondary | ICD-10-CM | POA: Insufficient documentation

## 2020-03-03 ENCOUNTER — Encounter: Payer: Self-pay | Admitting: Podiatry

## 2020-03-03 ENCOUNTER — Other Ambulatory Visit: Payer: Self-pay

## 2020-03-03 ENCOUNTER — Ambulatory Visit (INDEPENDENT_AMBULATORY_CARE_PROVIDER_SITE_OTHER): Payer: Medicare Other | Admitting: Podiatry

## 2020-03-03 ENCOUNTER — Ambulatory Visit (INDEPENDENT_AMBULATORY_CARE_PROVIDER_SITE_OTHER): Payer: Medicare Other

## 2020-03-03 DIAGNOSIS — L97521 Non-pressure chronic ulcer of other part of left foot limited to breakdown of skin: Secondary | ICD-10-CM | POA: Diagnosis not present

## 2020-03-03 DIAGNOSIS — E1140 Type 2 diabetes mellitus with diabetic neuropathy, unspecified: Secondary | ICD-10-CM

## 2020-03-03 DIAGNOSIS — L03032 Cellulitis of left toe: Secondary | ICD-10-CM | POA: Diagnosis not present

## 2020-03-03 DIAGNOSIS — E1149 Type 2 diabetes mellitus with other diabetic neurological complication: Secondary | ICD-10-CM

## 2020-03-06 LAB — WOUND CULTURE
MICRO NUMBER:: 10292563
SPECIMEN QUALITY:: ADEQUATE

## 2020-03-08 NOTE — Progress Notes (Addendum)
Subjective: 54 year old female presents the office today for same-day appointment given swelling to her left foot as well as redness to the big toe.  She states that swelling started about a week ago.  That the redness started.  She saw her kidney doctor today she was prescribed doxycycline but she has not picked this up yet.  She has not seen any drainage or pus.  No red streaks.  She denies any fevers, chills, nausea, vomiting.  No calf pain, chest pain, shortness of breath.  Overall she states that she is able to be active for about 4 hours in the morning.  This is a great improvement compared to when we first started.  She is still on IVIG therapy for CIDP  Objective: AAO x3, NAD DP/PT pulses palpable bilaterally, CRT less than 3 seconds Hyperkeratotic lesion plantar aspect left hallux and upon debridement of nonviable tissue there was a granular superficial wound present there is no probing, undermining or tunneling.  There is edema to the hallux and mild erythema as well as mild swelling to the foot.  There is mild warmth of the foot.  No pain in the leg.  No other lesions are identified. No open lesions or pre-ulcerative lesions.  No pain with calf compression, swelling, warmth, erythema  Assessment: Ulceration left hallux with cellulitis  Plan: -All treatment options discussed with the patient including all alternatives, risks, complications.  -X-rays obtained reviewed.  No definitive evidence of acute osteomyelitis. -I sharply debrided the wound utilizing a 312 with scalpel down to healthy, granular tissue to remove nonviable hyperkeratotic periwound.  Continue with doxycycline and started prescribed for her which is 100 mg twice a day.  Mupirocin ointment dressing changes daily.  Offloading at all times.  Elevation.  Wound culture obtained today. -Monitor for any clinical signs or symptoms of infection and directed to call the office immediately should any occur or go to the  ER. -Patient encouraged to call the office with any questions, concerns, change in symptoms.   Trula Slade DPM

## 2020-03-14 ENCOUNTER — Other Ambulatory Visit: Payer: Self-pay

## 2020-03-14 ENCOUNTER — Ambulatory Visit (INDEPENDENT_AMBULATORY_CARE_PROVIDER_SITE_OTHER): Payer: Medicare Other | Admitting: Podiatry

## 2020-03-14 ENCOUNTER — Encounter: Payer: Self-pay | Admitting: Podiatry

## 2020-03-14 VITALS — Temp 97.6°F

## 2020-03-14 DIAGNOSIS — L97521 Non-pressure chronic ulcer of other part of left foot limited to breakdown of skin: Secondary | ICD-10-CM | POA: Diagnosis not present

## 2020-03-14 DIAGNOSIS — E1149 Type 2 diabetes mellitus with other diabetic neurological complication: Secondary | ICD-10-CM | POA: Diagnosis not present

## 2020-03-14 DIAGNOSIS — E1140 Type 2 diabetes mellitus with diabetic neuropathy, unspecified: Secondary | ICD-10-CM

## 2020-03-14 NOTE — Progress Notes (Signed)
Subjective: 54 year old female presents the office today for follow-up evaluation of an ulceration of the left big toe.  She finished her antibiotics yesterday and says that overall she is doing better and the swelling has improved.  There is been some bloody drainage coming from the wound but no purulence.  No pain.  No other concerns. Denies any systemic complaints such as fevers, chills, nausea, vomiting. No acute changes since last appointment, and no other complaints at this time.   Objective: AAO x3, NAD DP/PT pulses palpable bilaterally, CRT less than 3 seconds Ulceration present on the plantar left hallux with surrounding hyperkeratotic tissue.  After debridement the wound measures 0.3 x 0.3 x 0.1 cm without any probing, undermining or tunneling.  There is no surrounding erythema, ascending cellulitis.  There is no fluctuation crepitation.  No malodor. No open lesions or pre-ulcerative lesions.  No pain with calf compression, swelling, warmth, erythema  Assessment: Ulceration left foot with improvement  Plan: -All treatment options discussed with the patient including all alternatives, risks, complications.  -I sharply debrided the wound today with #312 with scalpel, healthy, viable, granular tissue.  Continue with Medihoney dressing changes daily.  Continue offloading.  I reviewed the wound culture with her.  I will any further antibiotics unless there is any signs or symptoms of infection. -Patient encouraged to call the office with any questions, concerns, change in symptoms.   RTC 2 weeks  Trula Slade DPM

## 2020-03-29 ENCOUNTER — Ambulatory Visit (INDEPENDENT_AMBULATORY_CARE_PROVIDER_SITE_OTHER): Payer: Medicare Other | Admitting: Podiatry

## 2020-03-29 ENCOUNTER — Encounter: Payer: Self-pay | Admitting: Podiatry

## 2020-03-29 ENCOUNTER — Other Ambulatory Visit: Payer: Self-pay

## 2020-03-29 DIAGNOSIS — E1149 Type 2 diabetes mellitus with other diabetic neurological complication: Secondary | ICD-10-CM | POA: Diagnosis not present

## 2020-03-29 DIAGNOSIS — L97521 Non-pressure chronic ulcer of other part of left foot limited to breakdown of skin: Secondary | ICD-10-CM

## 2020-03-29 DIAGNOSIS — E1140 Type 2 diabetes mellitus with diabetic neuropathy, unspecified: Secondary | ICD-10-CM

## 2020-03-30 NOTE — Progress Notes (Signed)
Subjective: 54 year old female presents the office today for follow-up evaluation of an ulceration of the left big toe.  Overall she states that she has had some bloody drainage but she is not sure about drainage because she has been keeping medihoney on the wound.  No increase in swelling or redness.  Denies any drainage or pus.  Denies any fevers, chills, nausea, vomiting.  No calf pain, chest pain, shortness of breath.  Objective: AAO x3, NAD-presents with caregiver DP/PT pulses palpable bilaterally, CRT less than 3 seconds Ulceration present on the plantar left hallux with surrounding hyperkeratotic tissue.  After debridement the wound measures about the same at 0.3 x 0.3 x 0.1 cm without any probing, undermining or tunneling.  There is mild macerated tissue present.  There is no surrounding erythema, ascending cellulitis.  There is no fluctuation crepitation.  No malodor. No open lesions or pre-ulcerative lesions.  No pain with calf compression, swelling, warmth, erythema  Assessment: Ulceration left foot without signs of infection  Plan: -All treatment options discussed with the patient including all alternatives, risks, complications.  -I sharply debrided the wound today with #312 with scalpel, healthy, viable, granular tissue.  We will switch to Betadine painted over the ulcer followed by dry sterile dressing.  Continue offloading at all times.  I took the graphite insert out of her shoe and I made offloading pad and applied it to her insert within the shoe. -Monitor for any clinical signs or symptoms of infection and directed to call the office immediately should any occur or go to the ER.  Return in about 10 days (around 04/08/2020).  Trula Slade DPM

## 2020-04-12 ENCOUNTER — Ambulatory Visit (INDEPENDENT_AMBULATORY_CARE_PROVIDER_SITE_OTHER): Payer: Medicare Other | Admitting: Podiatry

## 2020-04-12 ENCOUNTER — Other Ambulatory Visit: Payer: Self-pay

## 2020-04-12 VITALS — Temp 97.3°F

## 2020-04-12 DIAGNOSIS — E1140 Type 2 diabetes mellitus with diabetic neuropathy, unspecified: Secondary | ICD-10-CM

## 2020-04-12 DIAGNOSIS — L97521 Non-pressure chronic ulcer of other part of left foot limited to breakdown of skin: Secondary | ICD-10-CM | POA: Diagnosis not present

## 2020-04-12 DIAGNOSIS — E1149 Type 2 diabetes mellitus with other diabetic neurological complication: Secondary | ICD-10-CM | POA: Diagnosis not present

## 2020-04-12 MED ORDER — SILVER SULFADIAZINE 1 % EX CREA: 1.0000 "application " | TOPICAL_CREAM | Freq: Every day | CUTANEOUS | 0 refills | Status: DC

## 2020-04-18 NOTE — Progress Notes (Signed)
Subjective: 54 year old female presents the office today for follow-up evaluation of an ulceration of the left big toe. She states the wound itself is doing better however just behind this area there is a crack in the skin that seems to be deeper. She denies any drainage or pus and she denies any increase in swelling or any redness or any drainage or pus. There is no fevers, chills, nausea, vomiting. No calf pain, chest pain, shortness of breath. No other concerns today.  Objective: AAO x3, NAD-presents with caregiver DP/PT pulses palpable bilaterally, CRT less than 3 seconds Ulceration present on the plantar left hallux with surrounding hyperkeratotic tissue. Upon debridement very small superficial opening is present however there is transverse crease present just proximal to this area in the fold with hyperkeratotic tissue. Upon debridement superficial granular wound is present. It appeared to be deeper prior to debridement but this was due to thick callus. ; Skin fissure no open lesions or pre-ulcerative lesions.  No pain with calf compression, swelling, warmth, erythema  Assessment: Ulceration left foot without signs of infection  Plan: -All treatment options discussed with the patient including all alternatives, risks, complications.  -I sharply debrided the wound today with #312 with scalpel, healthy, viable, granular tissue and also debrided the skin fissure. No obvious signs of infection noted today but closely monitor. We will switch to Silvadene dressing changes daily. Continue offloading. I do think that unfortunately the wound, skin fissure is due to the plantarflexed nature of the hallux and decreased range of motion. Continue with offloading. I previously made her pack for her shoe which was helpful but she is not wearing this today.  Return in about 2 weeks (around 04/26/2020) for wound check.  Trula Slade DPM

## 2020-04-28 ENCOUNTER — Encounter: Payer: Self-pay | Admitting: Podiatry

## 2020-04-28 ENCOUNTER — Other Ambulatory Visit: Payer: Self-pay

## 2020-04-28 ENCOUNTER — Ambulatory Visit (INDEPENDENT_AMBULATORY_CARE_PROVIDER_SITE_OTHER): Payer: Medicare Other | Admitting: Podiatry

## 2020-04-28 VITALS — Temp 97.9°F

## 2020-04-28 DIAGNOSIS — L97521 Non-pressure chronic ulcer of other part of left foot limited to breakdown of skin: Secondary | ICD-10-CM

## 2020-04-28 DIAGNOSIS — G6181 Chronic inflammatory demyelinating polyneuritis: Secondary | ICD-10-CM | POA: Diagnosis not present

## 2020-04-28 DIAGNOSIS — L603 Nail dystrophy: Secondary | ICD-10-CM

## 2020-04-30 NOTE — Progress Notes (Signed)
Subjective: 54 year old female presents the office today for follow-up evaluation of an ulceration of the left big toe.  She states that her skin some "ooze" coming from the wound and has noticed some bloody drainage on the bandage.  Denies any pus.  No increase in swelling or redness.  No pain although she does have neuropathy.  She also is concerned with the left second toenail splitting. Denies any fevers, chills, nausea, vomiting. No calf pain, chest pain, shortness of breath. No other concerns today.  Objective: AAO x3, NAD-presents with caregiver DP/PT pulses palpable bilaterally, CRT less than 3 seconds Ulceration present on the plantar left hallux with surrounding hyperkeratotic tissue.  Upon debridement there is still a superficial granular wound measuring 0.5 x 0.3 cm after debridement and after debridement the wound is 100% granular.  There is no probing to bone any tunneling or undermining.  There is no exposed tendon and superficial.  The area just proximal had a transverse skin fissure previously has resolved and there is no open lesion in this area.  No other lesions are identified.  The left second digit toenail has split longitudinally I debrided this and there is no ulceration or drainage or pus.  No pain with calf compression, swelling, warmth, erythema  Assessment: Ulceration left foot without signs of infection  Plan: -All treatment options discussed with the patient including all alternatives, risks, complications.  -I sharply debrided the wound today with #312 with scalpel, healthy, viable, granular tissue and also debrided the skin fissure.  There is no drainage today and only identified bloody drainage on the bandage.  Did not appear to be infected.  Continue with daily dressing changes.  Unfortunately given the neuropathy as well as the deformity of the toe as it is in a plantarflexed position is difficult to heal.  I want her to continue with offloading at all  times. -Debrided the left second toenail with any complications or bleeding. -Monitor for any clinical signs or symptoms of infection and directed to call the office immediately should any occur or go to the ER.  Trula Slade DPM

## 2020-05-10 ENCOUNTER — Other Ambulatory Visit: Payer: Self-pay

## 2020-05-10 MED ORDER — HYOSCYAMINE SULFATE 0.125 MG SL SUBL: SUBLINGUAL_TABLET | SUBLINGUAL | 0 refills | Status: DC

## 2020-05-19 ENCOUNTER — Other Ambulatory Visit: Payer: Self-pay

## 2020-05-19 ENCOUNTER — Ambulatory Visit (INDEPENDENT_AMBULATORY_CARE_PROVIDER_SITE_OTHER): Payer: Medicare Other | Admitting: Podiatry

## 2020-05-19 ENCOUNTER — Encounter: Payer: Self-pay | Admitting: Podiatry

## 2020-05-19 VITALS — Temp 97.5°F

## 2020-05-19 DIAGNOSIS — L97521 Non-pressure chronic ulcer of other part of left foot limited to breakdown of skin: Secondary | ICD-10-CM

## 2020-05-24 NOTE — Progress Notes (Signed)
Subjective: 54 year old female presents the office today for follow-up evaluation of an ulceration of the left big toe.  She states that the wound is doing better.  She denies any drainage coming from the area denies any pus or increase in swelling or redness.  She has no new concerns today. Denies any fevers, chills, nausea, vomiting. No calf pain, chest pain, shortness of breath. No other concerns today.  Objective: AAO x3, NAD-presents with caregiver DP/PT pulses palpable bilaterally, CRT less than 3 seconds Sensation decreased with Semmes Weinstein monofilament. Ulceration present on the plantar left hallux with surrounding hyperkeratotic tissue.  Upon debridement only a small superficial pinpoint opening is identified today.  There is no edema, erythema, drainage or pus recess infection.  Overall appears to be much improved.  Decreasing to motion to the digits on the left side compared to the right. No pain with calf compression, swelling, warmth, erythema  Assessment: Ulceration left foot without signs of infection-improving  Plan: -All treatment options discussed with the patient including all alternatives, risks, complications.  -I sharply debrided the wound today with #312 with scalpel, healthy, viable, granular tissue and also debrided the skin fissure.  No drainage today.  And overall the wound appears to be improving.  Continue with daily dressing changes.  Continue offloading. -Monitor for any clinical signs or symptoms of infection and directed to call the office immediately should any occur or go to the ER.  Return in about 2 weeks (around 06/02/2020).  Trula Slade DPM

## 2020-06-06 ENCOUNTER — Other Ambulatory Visit: Payer: Self-pay

## 2020-06-06 ENCOUNTER — Ambulatory Visit (INDEPENDENT_AMBULATORY_CARE_PROVIDER_SITE_OTHER): Payer: Medicare Other | Admitting: Podiatry

## 2020-06-06 DIAGNOSIS — G6181 Chronic inflammatory demyelinating polyneuritis: Secondary | ICD-10-CM

## 2020-06-06 DIAGNOSIS — L97521 Non-pressure chronic ulcer of other part of left foot limited to breakdown of skin: Secondary | ICD-10-CM | POA: Diagnosis not present

## 2020-06-20 ENCOUNTER — Telehealth: Payer: Self-pay | Admitting: Gastroenterology

## 2020-06-20 NOTE — Telephone Encounter (Signed)
Already scheduled her follow up appointment for 08-11-2020, this is the day when she will have a ride.

## 2020-06-20 NOTE — Progress Notes (Signed)
Subjective: 54 year old female presents the office today for follow-up evaluation of an ulceration of the left big toe.  She thinks the toe is doing okay and no new issues.  Denies any drainage or pus.  No swelling or redness.  She did get new shoes. Denies any fevers, chills, nausea, vomiting. No calf pain, chest pain, shortness of breath. No other concerns today.  Objective: AAO x3, NAD-presents with caregiver DP/PT pulses palpable bilaterally, CRT less than 3 seconds Sensation decreased with Semmes Weinstein monofilament. Hyperkeratotic lesion left plantar hallux.  There was dried blood under the callus.  Upon debridement there is a small pinpoint ulceration identified without any probing, undermining or tunneling.  There is no surrounding erythema, ascending cellulitis there is no fluctuation or crepitation but there is no malodor. No pain with calf compression, swelling, warmth, erythema  Assessment: Ulceration left foot without signs of infection-improving  Plan: -All treatment options discussed with the patient including all alternatives, risks, complications.  -I sharply debrided the wound today with #312 with scalpel, healthy, viable, granular tissue.  Continue daily dressing changes with Iodosorb.  I did make her new offloading pad for the shoes. -Monitor for any clinical signs or symptoms of infection and directed to call the office immediately should any occur or go to the ER.  Trula Slade DPM

## 2020-06-21 ENCOUNTER — Other Ambulatory Visit: Payer: Self-pay

## 2020-06-21 MED ORDER — HYOSCYAMINE SULFATE 0.125 MG SL SUBL: SUBLINGUAL_TABLET | SUBLINGUAL | 3 refills | Status: DC

## 2020-06-21 NOTE — Telephone Encounter (Signed)
3 month supply sent to pharmacy and patient said that is fine

## 2020-06-28 ENCOUNTER — Encounter: Payer: Self-pay | Admitting: Podiatry

## 2020-06-28 ENCOUNTER — Ambulatory Visit: Payer: Medicare Other | Admitting: Orthotics

## 2020-06-28 ENCOUNTER — Other Ambulatory Visit: Payer: Self-pay

## 2020-06-28 ENCOUNTER — Ambulatory Visit (INDEPENDENT_AMBULATORY_CARE_PROVIDER_SITE_OTHER): Payer: Medicare Other | Admitting: Podiatry

## 2020-06-28 DIAGNOSIS — G6181 Chronic inflammatory demyelinating polyneuritis: Secondary | ICD-10-CM

## 2020-06-28 DIAGNOSIS — M21372 Foot drop, left foot: Secondary | ICD-10-CM

## 2020-06-28 DIAGNOSIS — E1149 Type 2 diabetes mellitus with other diabetic neurological complication: Secondary | ICD-10-CM

## 2020-06-28 DIAGNOSIS — L97521 Non-pressure chronic ulcer of other part of left foot limited to breakdown of skin: Secondary | ICD-10-CM

## 2020-06-28 DIAGNOSIS — E1140 Type 2 diabetes mellitus with diabetic neuropathy, unspecified: Secondary | ICD-10-CM

## 2020-06-28 NOTE — Progress Notes (Signed)
orderd medium  ypsilon brace w/ soft kit; this to be fit under DBS insert to take pressure off toe ulcer hallux.

## 2020-06-29 NOTE — Progress Notes (Signed)
Subjective: 54 year old female presents the office today for follow-up evaluation of an ulceration of the left big toe.  She states that she still gets a callus along the area.  Denies any drainage or pus or any swelling or redness to the toe.  She also presents today to see [name] for possible bracing.  We previously done [place] braces as well as graphite inserts.  Discussed an off-the-shelf AFO with a diabetic insert. Denies any fevers, chills, nausea, vomiting. No calf pain, chest pain, shortness of breath. No other concerns today.  Objective: AAO x3, NAD-presents with caregiver DP/PT pulses palpable bilaterally, CRT less than 3 seconds Sensation decreased with Semmes Weinstein monofilament. Hyperkeratotic lesion left plantar hallux.  Also hyperkeratotic skin fissure proximal to the wound.  Upon debridement wound is still evident superficial skin breakdown with a granular wound base without any probing, undermining or tunneling.  There is no surrounding erythema, ascending cellulitis.  No fluctuation or crepitation.  There is no malodor.  Hallux sits in a plantarflexed position decreased strength in dorsiflexion of the left ankle. No pain with calf compression, swelling, warmth, erythema  Assessment: Ulceration left foot without signs of infection-improving  Plan: -All treatment options discussed with the patient including all alternatives, risks, complications.  -Debrided the wound today utilizing the 312 with scalpel to any complications down to healthy, bleeding, granular tissue.  Would continue with Medihoney dressing changes daily.  Continue offloading. -Rick evaluated the patient today.  Off-the-shelf AFO was dispensed with a diabetic insert to help take pressure off the hallux. -Monitor for any clinical signs or symptoms of infection and directed to call the office immediately should any occur or go to the ER.  Return in about 2 weeks (around 07/12/2020).  Trula Slade DPM

## 2020-07-07 ENCOUNTER — Telehealth: Payer: Self-pay | Admitting: Neurology

## 2020-07-07 NOTE — Telephone Encounter (Signed)
[facility] pharmacist contacted, he states [place] says pt having headaches days 2-4 after IVIG infusions, states pt used to have pre-hydration, requesting NS 500 ml and phenergan 25 mg IV as pre-infusion orders in addition to benadryl.

## 2020-07-07 NOTE — Telephone Encounter (Signed)
Orders signed and faxed back to [facility].

## 2020-07-07 NOTE — Telephone Encounter (Signed)
Pharmacist called in. Nurse that sees patient for IVIG infusion is requesting to add hydration fluids and some other medications before the infusion. Patient has lingering headaches after the infusion.

## 2020-07-07 NOTE — Telephone Encounter (Signed)
That would be fine. Please request them to fax orders and I will sign. Thanks.

## 2020-07-12 ENCOUNTER — Ambulatory Visit (INDEPENDENT_AMBULATORY_CARE_PROVIDER_SITE_OTHER): Payer: Medicare Other | Admitting: Orthotics

## 2020-07-12 ENCOUNTER — Other Ambulatory Visit: Payer: Self-pay

## 2020-07-12 ENCOUNTER — Ambulatory Visit (INDEPENDENT_AMBULATORY_CARE_PROVIDER_SITE_OTHER): Payer: Medicare Other | Admitting: Podiatry

## 2020-07-12 DIAGNOSIS — E1149 Type 2 diabetes mellitus with other diabetic neurological complication: Secondary | ICD-10-CM

## 2020-07-12 DIAGNOSIS — L97521 Non-pressure chronic ulcer of other part of left foot limited to breakdown of skin: Secondary | ICD-10-CM | POA: Diagnosis not present

## 2020-07-12 DIAGNOSIS — G6181 Chronic inflammatory demyelinating polyneuritis: Secondary | ICD-10-CM

## 2020-07-12 DIAGNOSIS — E1140 Type 2 diabetes mellitus with diabetic neuropathy, unspecified: Secondary | ICD-10-CM | POA: Diagnosis not present

## 2020-07-12 DIAGNOSIS — M21372 Foot drop, left foot: Secondary | ICD-10-CM

## 2020-07-12 NOTE — Progress Notes (Signed)
Patient picked  Up [facility] afo brace; great fit. Good toe clearance.

## 2020-07-19 NOTE — Progress Notes (Signed)
Subjective: 54 year old female presents the office today for follow-up evaluation of an ulceration of the left big toe.  States that she believes it may be somewhat more open but she is not sure she cannot see her but denies any pus coming to the area but only bloody drainage on the bandage.  She is not sure if it has been swelling more not.  No redness or red streaks.  Her last A1c was 7.4 and her last blood sugar was 120.  She has no other concerns today. Denies any fevers, chills, nausea, vomiting. No calf pain, chest pain, shortness of breath. No other concerns today.  Objective: AAO x3, NAD-presents with caregiver DP/PT pulses palpable bilaterally, CRT less than 3 seconds Sensation decreased with Semmes Weinstein monofilament Thick hyperkeratotic lesion sub hallux.  Upon debridement small superficial wounds evident there is no straight erythema, ascending cellulitis.  There is no fluctuation crepitation noted today and there is no malodor.  The wound appears about the same there is more hyperkeratotic tissue present today.  No malodor Hallux sits in a plantarflexed position decreased strength in dorsiflexion of the left ankle. No pain with calf compression, swelling, warmth, erythema  Assessment: Ulceration left foot without signs of infection  Plan: -All treatment options discussed with the patient including all alternatives, risks, complications.  -Debrided the wound today utilizing the 312 with scalpel to any complications down to healthy, bleeding, granular tissue.  Continue with daily dressing changes and will do Silvadene.  I also had Rick our ped orthotist evaluate her and she was dispensed off-the-shelf AFO and we added a diabetic insert over top of this in order to help offload the wound. -Monitor for any clinical signs or symptoms of infection and directed to call the office immediately should any occur or go to the ER.  No follow-ups on file.  Trula Slade DPM

## 2020-07-20 ENCOUNTER — Encounter: Payer: Self-pay | Admitting: Podiatry

## 2020-07-25 ENCOUNTER — Encounter: Payer: Self-pay | Admitting: Podiatry

## 2020-07-26 ENCOUNTER — Ambulatory Visit (INDEPENDENT_AMBULATORY_CARE_PROVIDER_SITE_OTHER): Payer: Medicare Other | Admitting: Podiatry

## 2020-07-26 ENCOUNTER — Other Ambulatory Visit: Payer: Self-pay

## 2020-07-26 ENCOUNTER — Encounter: Payer: Self-pay | Admitting: Podiatry

## 2020-07-26 VITALS — Temp 98.2°F | Resp 16

## 2020-07-26 DIAGNOSIS — L97521 Non-pressure chronic ulcer of other part of left foot limited to breakdown of skin: Secondary | ICD-10-CM | POA: Diagnosis not present

## 2020-07-26 DIAGNOSIS — G6181 Chronic inflammatory demyelinating polyneuritis: Secondary | ICD-10-CM

## 2020-07-26 DIAGNOSIS — M21372 Foot drop, left foot: Secondary | ICD-10-CM

## 2020-07-26 MED ORDER — DOXYCYCLINE HYCLATE 100 MG PO TABS: 100.0000 mg | ORAL_TABLET | Freq: Two times a day (BID) | ORAL | 0 refills | Status: DC

## 2020-07-26 NOTE — Progress Notes (Signed)
Subjective: 54 year old female presents the office today for follow-up evaluation of an ulceration of the left big toe. She has also develop a superficial ulcer on the leg. She feels that the brace was not fitting correctly. She has had to stop wearing this.  Since then she is also noted increased pain in her big toe a small callus on the wound.  Denies any drainage or pus.  She denies any fevers, chills, nausea, vomiting.  No calf pain, chest pain.  Objective: AAO x3, NAD-presents with caregiver DP/PT pulses palpable bilaterally, CRT less than 3 seconds Sensation decreased with Semmes Weinstein monofilament Superficial area skin breakdown anterior left leg with mild surrounding erythema.  No drainage or pus and appears to be scabbing over at this time.  Thick hyperkeratotic lesions of the hallux and there is no macerated tissue present.  Upon debridement the wound measures approximately 1 x 0 cm with a depth of 0.1.  There is no probing, a bone or tunneling.  Mild edema to the hallux there is no warmth.  There is no erythema or ascending cellulitis. No other open lesions. Hallux sits in a plantarflexed position decreased strength in dorsiflexion of the left ankle. No pain with calf compression, swelling, warmth, erythema  Assessment: Ulceration left foot ; left leg superficial wound from brace  Plan: -All treatment options discussed with the patient including all alternatives, risks, complications.  -Debrided the wound today utilizing the 312 with scalpel to any complications down to healthy, bleeding, granular tissue.  Continue with daily dressing changes with Betadine.  Prescribed doxycycline given the swelling as well as malodor.  -Brace is not fitting well. I have given her a prescription for [facility] for an AFO. -Monitor for any clinical signs or symptoms of infection and directed to call the office immediately should any occur or go to the ER.  No follow-ups on file.  Trula Slade  DPM

## 2020-08-02 ENCOUNTER — Encounter: Payer: Self-pay | Admitting: Podiatry

## 2020-08-02 ENCOUNTER — Ambulatory Visit (INDEPENDENT_AMBULATORY_CARE_PROVIDER_SITE_OTHER): Payer: Medicare Other | Admitting: Podiatry

## 2020-08-02 ENCOUNTER — Other Ambulatory Visit: Payer: Self-pay

## 2020-08-02 ENCOUNTER — Other Ambulatory Visit: Payer: Self-pay | Admitting: Podiatry

## 2020-08-02 DIAGNOSIS — G6181 Chronic inflammatory demyelinating polyneuritis: Secondary | ICD-10-CM | POA: Diagnosis not present

## 2020-08-02 DIAGNOSIS — L97521 Non-pressure chronic ulcer of other part of left foot limited to breakdown of skin: Secondary | ICD-10-CM | POA: Diagnosis not present

## 2020-08-02 DIAGNOSIS — M21372 Foot drop, left foot: Secondary | ICD-10-CM

## 2020-08-03 NOTE — Progress Notes (Signed)
Follow-up Visit   Date: 08/05/20    [name] MRN: [id number] DOB: August 14, 1966   Interim History: [name] is a 54 y.o. right-handed Caucasian female with insulin-dependent diabetes mellitus, thyroid cancer s/p resection secondary hypothyroidism (2016), hypertension, hyperlipidemia, GERD, s/p left L5 hemilaminectomy and discectomy, dysautonomia, chronic left ankle pain returning for a follow-up of chronic inflammatory demyelinating polyradiculoneuropathy.  History of present illness: She was diagnosed with CIDP in the fall of 2018 after left sural nerve biopsy showed demyelinating and axon loss findings.  Several years preceding this, she was having painful paresthesias and diffuse weakness of the feet which extended in the legs and arms.  She also developed dysautonomic symptoms of bladder incontinence, orthostatic hypotension, and syncope. She was frequently falling and started using a rollator.  She was started on IVIG in February 2019 and has been experiencing problems with IV venous access therefore had port-a-cath placed in October 2019.  By the fall of 2019, she was appreciating improved proximal arm strength and ability to grasp objects.  Tingling has improved, however the numbness remains unchanged.  She uses a power chair as needed for long distances, otherwise uses a walker at home.     UPDATE 08/03/2020:  She is here for follow-up. Overall, she feels that her neuropathy has been stable. Her hands continue to be weak and she reports dropping things frequently, but this is no worse than before.  She has had about 4 falls in the past 6 months due to imbalance and ankle instability.  She is getting fitted for a new AFO because her prior one caused skin breakdown.   She has orthopeadics issues related to her left wrist and may need surgery.   She complains of word-finding difficulty and remembering things.   Medications:  Current Outpatient Medications on  File Prior to Visit  Medication Sig Dispense Refill  . acetaminophen (TYLENOL) 500 MG tablet Take 1,000 mg by mouth every 6 (six) hours as needed for moderate pain or headache.     . ALPRAZolam (XANAX) 0.5 MG tablet Take 0.5 mg by mouth every 6 (six) hours as needed for anxiety or sleep.     . APPLE CIDER VINEGAR PO Take 1 each by mouth daily. One gummy once a day    . aspirin EC 81 MG tablet Take 81 mg by mouth daily.    Marland Kitchen atorvastatin (LIPITOR) 20 MG tablet Take 20 mg by mouth daily at 6 PM.     . b complex vitamins tablet Take 1 tablet by mouth daily.    Pleas Patricia Microlet Lancets lancets 4 each by Other route 4 (four) times daily.    . cetirizine (ZYRTEC) 10 MG tablet Take 10 mg by mouth at bedtime.    . clobetasol cream (TEMOVATE) 3.90 % Apply 1 application topically 2 (two) times daily as needed.     . cyclobenzaprine (FLEXERIL) 10 MG tablet Take 10 mg by mouth See admin instructions. Take 10 mg by mouth at bedtime. Take 10 mg by mouth every 8 hours as needed for muscle spasms    . D3-50 50000 units capsule Take 50,000 Units by mouth every Saturday.  4  . diclofenac Sodium (VOLTAREN) 1 % GEL APPLY 2 GRAMS TOPICALLY 4 TIMES DAILY. RUB INTO AFFECTED AREA OF FOOT 2-4 TIMES DAILY. 100 g 0  . dicyclomine (BENTYL) 10 MG capsule Take 10 mg by mouth 2 (two) times daily.    Marland Kitchen doxycycline (PERIOSTAT) 20 MG tablet Take 20  mg by mouth 2 (two) times daily.   3  . doxycycline (VIBRA-TABS) 100 MG tablet Take 1 tablet (100 mg total) by mouth 2 (two) times daily. 20 tablet 0  . DULoxetine (CYMBALTA) 60 MG capsule Take 60 mg by mouth daily.    . fluticasone (FLONASE) 50 MCG/ACT nasal spray Place 1 spray into both nostrils at bedtime.     . furosemide (LASIX) 40 MG tablet Take 40 mg by mouth daily. As needed for SOB or leg swelling    . gabapentin (NEURONTIN) 300 MG capsule Take 2 capsules (600 mg total) by mouth 3 (three) times daily. 540 capsule 3  . glucose blood (CONTOUR NEXT TEST) test strip 4 each by  Other route 4 (four) times daily.    Marland Kitchen HYDROcodone-acetaminophen (NORCO) 7.5-325 MG tablet Take 1-2 tablets by mouth every 6 (six) hours as needed for moderate pain (for pain.). 40 tablet 0  . hyoscyamine (LEVSIN SL) 0.125 MG SL tablet DISSOLVE 1 TABLET UNDER THE TONGUE EVERY 6 HOURS AS NEEDED. PLEASE CALL [phone] TO MAKE AN OFFICE VISIT FOR MORE REFILLS 120 tablet 3  . immune globulin, human, (GAMMAGARD S/D LESS IGA) 10 g injection Inject 40 g into the vein every 21 ( twenty-one) days. 40 mg daily for 3 days every 3 weeks    . Insulin Human (INSULIN PUMP) SOLN Inject into the skin.    Marland Kitchen insulin regular human CONCENTRATED (HUMULIN R) 500 UNIT/ML injection Inject 2 Units into the skin See admin instructions. 2.0 units /hour 0500-2359, 1.95units 0867-6195 with carb ratio 1:50 for meal time bolus    . INVOKAMET 50-1000 MG TABS Take 1 tablet by mouth 2 (two) times daily.   5  . ketoconazole (NIZORAL) 2 % cream Apply 1 application topically 2 (two) times daily as needed (for toes).     Marland Kitchen levothyroxine (SYNTHROID, LEVOTHROID) 137 MCG tablet Take 274 mcg by mouth daily before breakfast.    . lisinopril (ZESTRIL) 20 MG tablet Take 20 mg by mouth daily.    . metoprolol succinate (TOPROL-XL) 50 MG 24 hr tablet Take 50 mg by mouth daily. Take with or immediately following a meal.    . metroNIDAZOLE (METROCREAM) 0.75 % cream Apply 1 application topically 2 (two) times daily.     . Multiple Vitamin (MULTIVITAMIN) tablet Take 1 tablet by mouth at bedtime.     . mupirocin ointment (BACTROBAN) 2 % Apply 1 application topically 2 (two) times daily. 30 g 2  . ondansetron (ZOFRAN-ODT) 8 MG disintegrating tablet Take 1 tablet (8 mg total) by mouth See admin instructions. Take 8 mg by mouth 3 times daily before meals. Take 8 mg by mouth every 6 hours as needed for nausea and vomiting. 45 tablet 2  . pantoprazole (PROTONIX) 40 MG tablet Take 40 mg by mouth daily.    Marland Kitchen PROCTOSOL HC 2.5 % rectal cream Apply 1  application topically 2 (two) times daily as needed for hemorrhoids.   2  . silver sulfADIAZINE (SILVADENE) 1 % cream Apply 1 application topically daily. 50 g 0  . Turmeric (QC TUMERIC COMPLEX PO) Take 1 tablet by mouth daily.     No current facility-administered medications on file prior to visit.    Allergies:  Allergies  Allergen Reactions  . Penicillins Anaphylaxis, Shortness Of Breath, Swelling, Palpitations and Other (See Comments)    Has patient had a PCN reaction causing immediate rash, facial/tongue/throat swelling, SOB or lightheadedness with hypotension: Yes Has patient had a PCN reaction causing  severe rash involving mucus membranes or skin necrosis: No Has patient had a PCN reaction that required hospitalization No Has patient had a PCN reaction occurring within the last 10 years: No If all of the above answers are "NO", then may proceed with Cephalosporin use.   . Clarithromycin Nausea And Vomiting and Other (See Comments)    Any antibiotic (doxyclycine, etc)  . Other Other (See Comments)    All antibiotics makes patient sick  . Adhesive [Tape] Itching and Other (See Comments)    Redness  . Latex Itching, Rash and Other (See Comments)    Blisters with prolonged contact  . Moxifloxacin Nausea And Vomiting and Other (See Comments)    Needs Zofran or Phenergan    Vital Signs:  BP 125/81   Pulse 84   Ht '5\' 6"'  (1.676 m)   Wt 282 lb (127.9 kg)   SpO2 95%   BMI 45.52 kg/m   Neurological Exam: MENTAL STATUS including orientation to time, place, person, recent and remote memory, attention span and concentration, language, and fund of knowledge is normal.  Speech is not dysarthric.  CRANIAL NERVES:    Face is symmetric.  Extra ocular muscles intact  MOTOR: Tone is normal.  Improved muscle bulk in the intrinsic hand muscles.  No atrophy.   Right Upper Extremity:    Left Upper Extremity:    Deltoid  5/5   Deltoid  5/5   Biceps  5/5   Biceps  5/5   Triceps  5/5    Triceps  5/5   Wrist extensors  5/5   Wrist extensors  5/5   Wrist flexors 5/5  Wrist flexors  5-/5  Finger extensors  5/5   Finger extensors  5-/5   Finger flexors  5-/5   Finger flexors  5-/5  Dorsal interossei  5-/5   Dorsal interossei  5-/5   Abductor pollicis  5-/5   Abductor pollicis  5-/5  Tone (Ashworth scale)  0  Tone (Ashworth scale)  0   Right Lower Extremity:    Left Lower Extremity:    Hip flexors  5/5   Hip flexors  5-/5   Hip extensors  5/5   Hip extensors  5/5   Knee flexors  5/5   Knee flexors  5-/5   Knee extensors         5/5 Inversion 5/5  inversion 2/5  Eversion 5/5  Eversion 2/5  Dorsiflexors  5/5   Dorsiflexors  4/5   Plantarflexors  5/5   Plantarflexors  4/5   Toe extensors  4-/5   Toe extensors  1/5   Toe flexors  4-/5   Toe flexors  1/5   Tone (Ashworth scale)  0  Tone (Ashworth scale)  0    MSRs:    Right                                                                 Left brachioradialis 2+  brachioradialis 2+  biceps 2+  biceps 2+  triceps 2+  triceps 2+  patellar 3+  patellar 3+  ankle jerk 0  ankle jerk 0   SENSORY:  Pin prick reduced over the hands and mild-calf distally.   COORDINATION/GAIT:  Gait assisted with a walker, stable.  Data: NCS/EMG of the arms 01/29/2017: The electrophysiologic testing is most consistent with an active on chronic, distal and symmetric sensorimotor polyneuropathy, demyelinating and axon loss in type. Overall, these findings are severe in degree electrically.   In addition, the presence of temporal dispersion suggests an acquired condition.  NCS/EMG of the legs 01/10/2017:    The electrophysiologic findings are most consistent with a subacute sensorimotor polyneuropathy, axon loss in type, affecting the lower extremities and worse on the left. Overall, these findings are severe in degree electrically.  Labs 01/25/2017:  ESR 10, CRP 0.3, vitamin B12 256, ACE 5, MMA 231, ANA neg, SSA/B neg,  copper 92, vitamin B1 6*, SPEP with IFE no M protein  CSF 02/19/2017:  R1200, W0 G76 P37  ACE 10, IgG index 0.47, Lyme neg, cytology neg, no OCB  MRI lumbar spine wwo contrast 02/28/2017: 1. Stable appearance of the lumbar spine. No acute abnormality identified. 2. Postoperative changes on the left L5-S1 without residual stenosis. 3. Small left subarticular disc protrusion at L3-4 encroaching upon the left lateral recess, and potentially affecting the descending left L4 nerve root. 4. Mild bilateral foraminal stenosis at L4-5 related to disc bulge and facet degeneration.  MRI cervical spine wwo contrast 09/09/2017: 1. Normal MRI appearance of the cervical spinal cord. No cord signal abnormality is identified. 2. Multilobulated disc protrusion at C4-5, flattening the right hemi cord without cord signal changes. 3. Broad posterior disc bulge at C6-7 with resultant mild canal and left C7 foraminal stenosis. 4. Central disc protrusion at C3-4 with resultant mild spinal stenosis. No significant cord deformity.  MRI brain wwo contrast 09/09/2017:  Normal MRI of the brain. No acute intracranial abnormality identified.  Left sural nerve biopsy performed on 10/22 shows chronic (predominant) and acute loss of large myelinated fibers with possible demyelination/remyelination process consistent with prior diagnosis of chronic inflammatory demyelinating poly-radicular neuropathy; marked thickening of the endoneutrial vessels consistent with diabetic neuropathy, clinical correlation recommended.   Left vastus lateralis muscle biopsy: 2 isolated foci of very mild perivascular, possibly vascular, inflammation, and mild nonspecific mild myofiber atrophy and skeletal muscle example without myofiber necrosis.   IMPRESSION/PLAN: 1.  Chronic inflammatory polyradiculoneuropathy with distal weakness, sensory loss (worse on the left), and dysautonomia.  Diagnosed by sural nerve biopsy (09/2017); CSF testing was  normal, however NCS/EMG was highly suggestive of a subacute polyneuropathy.  IVIG started in February 2019 and on IVIG 1g/kg every 3 weeks via port-a-cath.  She has been tolerating infusions well and showing improved motor strength in the upper extremities and right leg. She continues to have left foot drop. Change IVIG 7m/kg over 3 days to every 28 days x 3 cycles, then every 6 weeks If unable to taper IVIG, will need to start azathioprine or cellcept For her painful paresthesias, continue gabapentin 6046mTID.   2.  Chronic low back pain.  Continue flexeril 1041mt bedtime at bedtime  Return to clinic 6 month   Thank you for allowing me to participate in patient's care.  If I can answer any additional questions, I would be pleased to do so.    Sincerely,    Anas Reister K. PatPosey ProntoO

## 2020-08-05 ENCOUNTER — Ambulatory Visit (INDEPENDENT_AMBULATORY_CARE_PROVIDER_SITE_OTHER): Payer: Medicare Other | Admitting: Neurology

## 2020-08-05 ENCOUNTER — Encounter: Payer: Self-pay | Admitting: Neurology

## 2020-08-05 ENCOUNTER — Other Ambulatory Visit: Payer: Self-pay

## 2020-08-05 VITALS — BP 125/81 | HR 84 | Ht 66.0 in | Wt 282.0 lb

## 2020-08-05 DIAGNOSIS — G6181 Chronic inflammatory demyelinating polyneuritis: Secondary | ICD-10-CM | POA: Diagnosis not present

## 2020-08-05 NOTE — Patient Instructions (Addendum)
We will adjust IVIG to every 28 days x 3 cycles, then every 6 weeks  Return to clinic in 6 months

## 2020-08-08 NOTE — Progress Notes (Signed)
Subjective: 54 year old female presents the office today for follow-up evaluation of an ulceration of the left big toe.  She states that the wound is doing about the same.  Denies any drainage or pus but the calluses come back thick.  She has been keeping Betadine on the wound. Denies any drainage or pus.  She denies any fevers, chills, nausea, vomiting.  No calf pain, chest pain.  Objective: AAO x3, NAD-presents with caregiver DP/PT pulses palpable bilaterally, CRT less than 3 seconds Sensation decreased with Semmes Weinstein monofilament Superficial area skin breakdown anterior left leg and a scab that started formed.  There is no drainage or pus or any significant erythema, ascending cellulitis peer there is no fluctuance or crepitation.  Hyperkeratotic lesion present on the plantar aspect of the hallux and upon debridement the wound is smaller measuring 0.6 x 0.3 x 0.1 cm.  No significant edema, erythema to the toe there is no ascending cellulitis peer there is no fluctuation crepitation. Hallux sits in a plantarflexed position decreased strength in dorsiflexion of the left ankle. No pain with calf compression, swelling, warmth, erythema  Assessment: Ulceration left foot ; left leg superficial wound from brace  Plan: -All treatment options discussed with the patient including all alternatives, risks, complications.  -Debrided the wound today utilizing the 312 with scalpel to any complications down to healthy, bleeding, granular tissue.  Continue with daily dressing changes.  Silvadene.  Continue antibiotic ointment to the leg abrasion. -Offloading.  She has an appointment for [facility] for bracing. -Monitor for any clinical signs or symptoms of infection and directed to call the office immediately should any occur or go to the ER.  Trula Slade DPM

## 2020-08-11 ENCOUNTER — Ambulatory Visit (INDEPENDENT_AMBULATORY_CARE_PROVIDER_SITE_OTHER): Payer: Medicare Other | Admitting: Gastroenterology

## 2020-08-11 ENCOUNTER — Encounter: Payer: Self-pay | Admitting: Gastroenterology

## 2020-08-11 VITALS — BP 122/70 | HR 89 | Ht 66.0 in | Wt 289.2 lb

## 2020-08-11 DIAGNOSIS — K581 Irritable bowel syndrome with constipation: Secondary | ICD-10-CM

## 2020-08-11 DIAGNOSIS — K760 Fatty (change of) liver, not elsewhere classified: Secondary | ICD-10-CM | POA: Diagnosis not present

## 2020-08-11 MED ORDER — DICYCLOMINE HCL 10 MG PO CAPS: 10.0000 mg | ORAL_CAPSULE | Freq: Four times a day (QID) | ORAL | 11 refills | Status: DC | PRN

## 2020-08-11 MED ORDER — HYOSCYAMINE SULFATE 0.125 MG SL SUBL: SUBLINGUAL_TABLET | SUBLINGUAL | 11 refills | Status: DC

## 2020-08-11 MED ORDER — PANTOPRAZOLE SODIUM 40 MG PO TBEC: 40.0000 mg | DELAYED_RELEASE_TABLET | Freq: Two times a day (BID) | ORAL | 11 refills | Status: DC

## 2020-08-11 NOTE — Patient Instructions (Addendum)
If you are age 53 or older, your body mass index should be between 23-30. Your Body mass index is 46.69 kg/m. If this is out of the aforementioned range listed, please consider follow up with your Primary Care Provider.  If you are age 72 or younger, your body mass index should be between 19-25. Your Body mass index is 46.69 kg/m. If this is out of the aformentioned range listed, please consider follow up with your Primary Care Provider.   We have sent the following medications to your pharmacy for you to pick up at your convenience: Protonix  Hyoscamine Bentyl  You have been scheduled for an abdominal ultrasound at [facility] on 08/16/20 at 8:30am. Please arrive 15 minutes prior to your appointment for registration. Make certain not to have anything to eat or drink 6 hours prior to your appointment. Should you need to reschedule your appointment, please contact radiology at [phone]. This test typically takes about 30 minutes to perform.   Thank you,  Dr. Jackquline Denmark

## 2020-08-11 NOTE — Progress Notes (Signed)
IMPRESSION and PLAN:    #1. H/O Colonic Polyps. S/P colonoscopy 06/2018, fair prep, tubular adenomas. Next due 06/2021  #2.  GERD with eso stricture s/p EGD with dil 18 mm TTS 06/2018 with resolution of dysphagia.  Prev Bx neg for EoE. -Continue Protonix 69m po bid #60, 11 refills  #3. Fatty Liver.  Patient at risk for liver cirrhosis.  Has associated obesity, DM2, HLD. [facility]  [facility]  Being followed by serial ultrasounds. Neg autoimmune hepatitis profile, AMA, ceruloplasmin, alpha-1 antitrypsin, p-ANCA, acute hepatitis profile 09/2017.  -Proceed with [place]. -Trend LFTs (had these done at nephrology)  #4. IBS-D. Neg random colonic biopsies for microscopic colitis 2008.  -Continue Hyoscamine 0.125 mg sublingual 1 to 2 tablets every 6-8 hours as needed. # 90, 11 refiils -Continue Bentyl 10 mg p.o. 4 times daily as needed #120, 11 refiils -This regimen has helped her a lot.      HPI:    Chief Complaint:   Jenna MANJA NEUZILis a 54y.o. female  With PMH of DM2 req insulin, HTN, HLD, fatty liver, CIDP, fibromyalgia, anxiety/depression, obesity, migraines thyroid cancer s/p resection with secondary hypothyroidism For follow-up visit Doing well from GI standpoint Here for medication refill.  Denies having any diarrhea as long as she takes Bentyl/hyoscyamine.  She is on a very strict regimen. Her swallowing is much better.  She would occasionally have problems with solid food dysphagia if she tries to eat too fast.  No significant nausea/vomiting/postprandial abdominal bloating.  No fever chills or night sweats.  No recent weight loss.  Had LFTs at nephrology which were "stable".  Gets around with a walker due to progressive neuropathy.    Past Medical History:  Diagnosis Date  . Anginal pain ([place]    chest pain  heart cath done  . Anxiety   . Cancer ([facility]    thyroid cancer  .  Chronic bronchitis ([place]    "get it q yr"  . Chronic inflammatory demyelinating polyneuropathy ([place]   . Chronic kidney disease    Proteinuria  . Dyspnea    sob with exertion  . Early cataract    right  . Fibromyalgia   . GERD (gastroesophageal reflux disease)   . Heart murmur   . Hepatic adenoma   . High cholesterol   . History of gout   . History of hiatal hernia   . History of kidney stones   . Hypertension   . Hypothyroidism   . IBS (irritable bowel syndrome)   . Iron deficiency anemia    "comes and goes"  . Migraine    "@ least once/month" (02/18/2015)  . Obesity   . PONV (postoperative nausea and vomiting)    patch and anti nausea meds work well  . Proteinuria   . Spinal headache   . Syncope   . Tachycardia   . Type II diabetes mellitus ([place]    pt has an insulin pump  . Wears glasses     Current Outpatient Medications  Medication Sig Dispense Refill  . acetaminophen (TYLENOL) 500 MG tablet Take 1,000 mg by mouth every 6 (six) hours as needed for moderate pain or headache.     . ALPRAZolam (XANAX) 0.5 MG tablet Take 0.5 mg by mouth every 6 (six) hours as needed for anxiety or sleep.     . APPLE CIDER VINEGAR PO Take 1 each  by mouth daily. One gummy once a day    . aspirin EC 81 MG tablet Take 81 mg by mouth daily.    Marland Kitchen atorvastatin (LIPITOR) 20 MG tablet Take 20 mg by mouth daily at 6 PM.     . b complex vitamins tablet Take 1 tablet by mouth daily.    Pleas Patricia Microlet Lancets lancets 4 each by Other route 4 (four) times daily.    . cetirizine (ZYRTEC) 10 MG tablet Take 10 mg by mouth at bedtime.    . clobetasol cream (TEMOVATE) 9.51 % Apply 1 application topically 2 (two) times daily as needed.     . cyclobenzaprine (FLEXERIL) 10 MG tablet Take 10 mg by mouth See admin instructions. Take 10 mg by mouth at bedtime. Take 10 mg by mouth every 8 hours as needed for muscle spasms    . D3-50 50000 units capsule Take 50,000 Units by mouth every Saturday.  4  .  diclofenac Sodium (VOLTAREN) 1 % GEL APPLY 2 GRAMS TOPICALLY 4 TIMES DAILY. RUB INTO AFFECTED AREA OF FOOT 2-4 TIMES DAILY. 100 g 0  . dicyclomine (BENTYL) 10 MG capsule Take 10 mg by mouth 2 (two) times daily.    Marland Kitchen doxycycline (PERIOSTAT) 20 MG tablet Take 20 mg by mouth 2 (two) times daily.   3  . DULoxetine (CYMBALTA) 60 MG capsule Take 60 mg by mouth daily.    . fluticasone (FLONASE) 50 MCG/ACT nasal spray Place 1 spray into both nostrils at bedtime.     . furosemide (LASIX) 40 MG tablet Take 40 mg by mouth daily. As needed for SOB or leg swelling    . gabapentin (NEURONTIN) 300 MG capsule Take 2 capsules (600 mg total) by mouth 3 (three) times daily. 540 capsule 3  . glucose blood (CONTOUR NEXT TEST) test strip 4 each by Other route 4 (four) times daily.    Marland Kitchen HYDROcodone-acetaminophen (NORCO) 7.5-325 MG tablet Take 1-2 tablets by mouth every 6 (six) hours as needed for moderate pain (for pain.). 40 tablet 0  . hyoscyamine (LEVSIN SL) 0.125 MG SL tablet DISSOLVE 1 TABLET UNDER THE TONGUE EVERY 6 HOURS AS NEEDED. PLEASE CALL [phone] TO MAKE AN OFFICE VISIT FOR MORE REFILLS 120 tablet 3  . immune globulin, human, (GAMMAGARD S/D LESS IGA) 10 g injection Inject 40 g into the vein every 21 ( twenty-one) days. 40 mg daily for 3 days every 3 weeks    . Insulin Human (INSULIN PUMP) SOLN Inject into the skin.    Marland Kitchen insulin regular human CONCENTRATED (HUMULIN R) 500 UNIT/ML injection Inject 2 Units into the skin See admin instructions. 2.0 units /hour 0500-2359, 1.95units 1601-0932 with carb ratio 1:50 for meal time bolus    . INVOKAMET 50-1000 MG TABS Take 1 tablet by mouth 2 (two) times daily.   5  . ketoconazole (NIZORAL) 2 % cream Apply 1 application topically 2 (two) times daily as needed (for toes).     Marland Kitchen levothyroxine (SYNTHROID, LEVOTHROID) 137 MCG tablet Take 274 mcg by mouth daily before breakfast.    . lisinopril (ZESTRIL) 20 MG tablet Take 20 mg by mouth daily.    . metoprolol succinate  (TOPROL-XL) 50 MG 24 hr tablet Take 50 mg by mouth daily. Take with or immediately following a meal.    . metroNIDAZOLE (METROCREAM) 0.75 % cream Apply 1 application topically 2 (two) times daily.     . Multiple Vitamin (MULTIVITAMIN) tablet Take 1 tablet by mouth at bedtime.     Marland Kitchen  mupirocin ointment (BACTROBAN) 2 % Apply 1 application topically 2 (two) times daily. 30 g 2  . ondansetron (ZOFRAN-ODT) 8 MG disintegrating tablet Take 1 tablet (8 mg total) by mouth See admin instructions. Take 8 mg by mouth 3 times daily before meals. Take 8 mg by mouth every 6 hours as needed for nausea and vomiting. 45 tablet 2  . pantoprazole (PROTONIX) 40 MG tablet Take 40 mg by mouth daily.    Marland Kitchen PROCTOSOL HC 2.5 % rectal cream Apply 1 application topically 2 (two) times daily as needed for hemorrhoids.   2  . silver sulfADIAZINE (SILVADENE) 1 % cream Apply 1 application topically daily. 50 g 0  . Turmeric (QC TUMERIC COMPLEX PO) Take 1 tablet by mouth daily.     No current facility-administered medications for this visit.    Past Surgical History:  Procedure Laterality Date  . ABDOMINAL HYSTERECTOMY  ~2012   "lap"  . ANKLE RECONSTRUCTION Left 03/29/2016   Procedure: LEFT ANKLE LATERAL LIGAMENT RECONSTRUCTION AND PERONEAL TENDON REPAIR OR TENOLYSIS;  Surgeon: Wylene Simmer, MD;  Location: [place];  Service: Orthopedics;  Laterality: Left;  . BACK SURGERY    . CARDIAC CATHETERIZATION  04/2009   "clean"  . COLONOSCOPY  01/12/2015   Colonic polyps status post polypectomy, small internal hemorrhoids. Otherwise normal. Bx: mild chronic gastritis.  . CYSTOSCOPY WITH URETEROSCOPY, STONE BASKETRY AND STENT PLACEMENT  ~ 2006  . ESOPHAGOGASTRODUODENOSCOPY  07/21/2015   Esophageal stricture status post esophageal dilatation. Mild gastritis.  Marland Kitchen insulin pump     U 500  . IR IMAGING GUIDED PORT INSERTION  09/22/2018  . IRRIGATION AND DEBRIDEMENT KNEE Left 04/03/2018   Procedure: Arthroscopic versus open irrigation and  debridement  left knee;  Surgeon: Nicholes Stairs, MD;  Location: [place];  Service: Orthopedics;  Laterality: Left;  . LAPAROSCOPIC CHOLECYSTECTOMY  05/2004  . LUMBAR DISC SURGERY  08/2011  . SURAL NERVE BX Left 09/30/2017   Procedure: LEFT SURAL NERVE AND MUSCLE BIOPSY;  Surgeon: Newman Pies, MD;  Location: [place];  Service: Neurosurgery;  Laterality: Left;  LEFT SURAL NERVE AND MUSCLE BIOPSY  . THYROIDECTOMY N/A 02/18/2015   Procedure: TOTAL THYROIDECTOMY;  Surgeon: [name], MD;  Location: [place];  Service: General;  Laterality: N/A;  . TOTAL THYROIDECTOMY  02/18/2015    Family History  Problem Relation Age of Onset  . Hypertension Mother   . Emphysema Mother   . AAA (abdominal aortic aneurysm) Mother   . Hypertension Father   . Coronary artery disease Father   . Cancer - Prostate Father   . Renal Disease Father   . Hypertension Brother   . Colon cancer Neg Hx   . Esophageal cancer Neg Hx   . Liver cancer Neg Hx   . Pancreatic cancer Neg Hx   . Rectal cancer Neg Hx   . Stomach cancer Neg Hx     Social History   Tobacco Use  . Smoking status: Never Smoker  . Smokeless tobacco: Never Used  Vaping Use  . Vaping Use: Never used  Substance Use Topics  . Alcohol use: No  . Drug use: No    Allergies  Allergen Reactions  . Penicillins Anaphylaxis, Shortness Of Breath, Swelling, Palpitations and Other (See Comments)    Has patient had a PCN reaction causing immediate rash, facial/tongue/throat swelling, SOB or lightheadedness with hypotension: Yes Has patient had a PCN reaction causing severe rash involving mucus membranes or skin necrosis: No Has patient had  a PCN reaction that required hospitalization No Has patient had a PCN reaction occurring within the last 10 years: No If all of the above answers are "NO", then may proceed with Cephalosporin use.   . Clarithromycin Nausea And Vomiting and Other (See Comments)    Any antibiotic (doxyclycine, etc)  . Other  Other (See Comments)    All antibiotics makes patient sick  . Adhesive [Tape] Itching and Other (See Comments)    Redness  . Latex Itching, Rash and Other (See Comments)    Blisters with prolonged contact  . Moxifloxacin Nausea And Vomiting and Other (See Comments)    Needs Zofran or Phenergan     Review of Systems: All systems reviewed and negative except where noted in HPI.    Physical Exam:     BP 122/70   Pulse 89   Ht '5\' 6"'  (1.676 m)   Wt 289 lb 4 oz (131.2 kg)   BMI 46.69 kg/m   Filed Weights   08/11/20 0818  Weight: 289 lb 4 oz (131.2 kg)    GENERAL:  Alert, oriented, cooperative, not in acute distress. PSYCH: :Pleasant, normal mood and affect. HEENT:  conjunctiva pink, mucous membranes moist, neck supple without masses. No jaundice. CARDIAC:  S1 S2 normal. No murmers. PULM: Normal respiratory effort, lungs CTA bilaterally, no wheezing. ABDOMEN: Inspection: No visible peristalsis, no abnormal pulsations, skin normal.  Palpation/percussion: Soft, nontender, nondistended, no rigidity, no abnormal dullness to percussion, no hepatosplenomegaly and no palpable abdominal masses.  Auscultation: Normal bowel sounds, no abdominal bruits. Rectal exam: Deferred SKIN:  turgor, no lesions seen. NEURO: Alert and oriented x 3, no focal neurologic deficits.   Data Reviewed: I have personally reviewed following labs and imaging studies  CBC Latest Ref Rng & Units 09/22/2018 04/03/2018 09/26/2017  WBC 4.0 - 10.5 K/uL 4.5 9.7 5.9  Hemoglobin 12.0 - 15.0 g/dL 14.1 11.7(L) 13.4  Hematocrit 36 - 46 % 43.1 36.3 41.8  Platelets 150 - 400 K/uL 177 435(H) 193   Hepatic Function Latest Ref Rng & Units 09/26/2017 07/01/2009  Total Protein 6.5 - 8.1 g/dL 6.9 6.0  Albumin 3.5 - 5.0 g/dL 4.1 3.2(L)  AST 15 - 41 U/L 105(H) 47(H)  ALT 14 - 54 U/L 71(H) 32  Alk Phosphatase 38 - 126 U/L 83 39  Total Bilirubin 0.3 - 1.2 mg/dL 0.5 1.1       Roselene Gray,MD 08/11/2020, 8:45 AM   CC Reynold Bowen, MD

## 2020-08-12 ENCOUNTER — Other Ambulatory Visit: Payer: Self-pay

## 2020-08-12 ENCOUNTER — Telehealth: Payer: Self-pay | Admitting: Gastroenterology

## 2020-08-12 MED ORDER — ONDANSETRON 8 MG PO TBDP
8.0000 mg | ORAL_TABLET | ORAL | 0 refills | Status: DC
Start: 2020-08-12 — End: 2020-11-22

## 2020-08-12 NOTE — Telephone Encounter (Signed)
Pt called to inform that prescription for Zofran was not sent to her pharmacy yesterday. She states that she takes Zofran 8 mg disintegrated tablets, she uses [facility] in [place].

## 2020-08-12 NOTE — Telephone Encounter (Signed)
[name] from [facility] is requesting clarification on the pt's Zofran.  [phone]

## 2020-08-16 NOTE — Telephone Encounter (Signed)
Called and spoke to [facility] they said they had already received the clarification.

## 2020-08-23 ENCOUNTER — Ambulatory Visit (INDEPENDENT_AMBULATORY_CARE_PROVIDER_SITE_OTHER): Payer: Medicare Other | Admitting: Podiatry

## 2020-08-23 ENCOUNTER — Other Ambulatory Visit: Payer: Self-pay

## 2020-08-23 DIAGNOSIS — L97521 Non-pressure chronic ulcer of other part of left foot limited to breakdown of skin: Secondary | ICD-10-CM | POA: Diagnosis not present

## 2020-08-30 NOTE — Progress Notes (Signed)
Subjective: 54 year old female presents the office today for follow-up evaluation of an ulceration of the left big toe.  She states that a lot of the skin peeled off on its own.  Wound is still present.  She did follow-up with [facility] and she is scheduled to pick up the brace next week.  Denies any increase in swelling or redness.  Denies any drainage or pus.  No fevers, chills, nausea, vomiting.  No calf pain, chest pain, shortness of breath.   Objective: AAO x3, NAD-presents with caregiver DP/PT pulses palpable bilaterally, CRT less than 3 seconds Sensation decreased with Semmes Weinstein monofilament Hyperkeratotic lesion plantar aspect left hallux.  Upon debridement wound today measures approximate 0.5 x 0.2 x 0.1 cm.  There is no probing to bone, undermining tunneling.  There is no edema, erythema to the toe.  The hallux sits in a plantarflexed position at the level of the MPJ. Hallux sits in a plantarflexed position decreased strength in dorsiflexion of the left ankle. No pain with calf compression, swelling, warmth, erythema  Assessment: Ulceration left foot ;  Plan: -All treatment options discussed with the patient including all alternatives, risks, complications.  -Debrided the wound today utilizing the 312 with scalpel to any complications down to healthy, bleeding, granular tissue.  Continue with daily dressing changes with silvadene.   -Offloading.  Hopefully she will pick up the brace next week. -Monitor for any clinical signs or symptoms of infection and directed to call the office immediately should any occur or go to the ER.  Trula Slade DPM

## 2020-09-06 ENCOUNTER — Ambulatory Visit (INDEPENDENT_AMBULATORY_CARE_PROVIDER_SITE_OTHER): Payer: Medicare Other | Admitting: Podiatry

## 2020-09-06 ENCOUNTER — Other Ambulatory Visit: Payer: Self-pay

## 2020-09-06 DIAGNOSIS — M21372 Foot drop, left foot: Secondary | ICD-10-CM | POA: Diagnosis not present

## 2020-09-06 DIAGNOSIS — G6181 Chronic inflammatory demyelinating polyneuritis: Secondary | ICD-10-CM

## 2020-09-06 DIAGNOSIS — L97521 Non-pressure chronic ulcer of other part of left foot limited to breakdown of skin: Secondary | ICD-10-CM | POA: Diagnosis not present

## 2020-09-08 NOTE — Progress Notes (Signed)
Subjective: 54 year old female presents the office today for follow-up evaluation of an ulceration of the left big toe.  She states that she been doing better.  Since I last saw her she was able to get the brace from [facility].  Since wearing the brace she feels that she is walking better.  Also the big toe does not seem to be as irritated. She has been keeping Medihoney on the wound daily. Denies any drainage or pus.  No fevers, chills, nausea, vomiting.  No calf pain, chest pain, shortness of breath.   Objective: AAO x3, NAD-presents with caregiver DP/PT pulses palpable bilaterally, CRT less than 3 seconds Sensation decreased with Semmes Weinstein monofilament There is minimal hyperkeratotic tissue on the plantar aspect left hallux however superficial granular wound is still evident measuring possibly 0.3 x 0.2 x 0.1 cm and there is no probing to bone, undermining or tunneling.  There is no surrounding erythema, ascending cellulitis peer there is no fluctuation capitation.  There is no malodor. Hallux sits in a plantarflexed position decreased strength in dorsiflexion of the left ankle. No pain with calf compression, swelling, warmth, erythema  Assessment: Ulceration left foot; dropfoot  Plan: -All treatment options discussed with the patient including all alternatives, risks, complications.  -Debrided the wound today utilizing the 312 with scalpel to any complications down to healthy, bleeding, granular tissue.  Continue with daily dressing changes with medihoney.   -Offloading.  She seems to be doing better.  Discussed some modifications to the brace.  She is already scheduled to follow back up with [facility].  Discussed possibly offloading for the hallux wound as well as a strep on the foot. -Monitor for any clinical signs or symptoms of infection and directed to call the office immediately should any occur or go to the ER.  Trula Slade DPM

## 2020-09-15 ENCOUNTER — Ambulatory Visit (INDEPENDENT_AMBULATORY_CARE_PROVIDER_SITE_OTHER): Payer: Medicare Other | Admitting: Podiatry

## 2020-09-15 ENCOUNTER — Other Ambulatory Visit: Payer: Self-pay

## 2020-09-15 DIAGNOSIS — L97521 Non-pressure chronic ulcer of other part of left foot limited to breakdown of skin: Secondary | ICD-10-CM

## 2020-09-15 DIAGNOSIS — G6181 Chronic inflammatory demyelinating polyneuritis: Secondary | ICD-10-CM

## 2020-09-15 MED ORDER — SILVER SULFADIAZINE 1 % EX CREA: 1.0000 "application " | TOPICAL_CREAM | Freq: Every day | CUTANEOUS | 0 refills | Status: DC

## 2020-09-20 NOTE — Progress Notes (Signed)
Subjective: 54 year old female presents the office today for follow-up evaluation of an ulceration of the left big toe.  She went for a brace modifications today had a strap added as well as offloading for the hallux.  She states that she is having some occasional bleeding but no pus or swelling or redness.  She has no new concerns today.  Denies any fevers, chills, nausea, vomiting.  No calf pain, chest pain, shortness of breath.   Objective: AAO x3, NAD-presents with caregiver DP/PT pulses palpable bilaterally, CRT less than 3 seconds Sensation decreased with Semmes Weinstein monofilament There is minimal hyperkeratotic tissue on the plantar aspect left hallux however superficial granular wound is still evident although appears to be almost healed.  Some is a pinpoint type lesion.  There is no edema, erythema, drainage or pus or signs of infection.  Brace appears to be fitting well. No pain with calf compression, swelling, warmth, erythema  Assessment: Ulceration left foot; dropfoot  Plan: -All treatment options discussed with the patient including all alternatives, risks, complications.  -Debrided the wound today utilizing a number 312 with scalpel to any complications down to healthy, bleeding, granular tissue.  Continue with daily dressing changes with medihoney.   -Now the brace is fitting better and he is offloading the hallux and the wound will continue to heal.  Gait is much improved with the brace.  Trula Slade DPM

## 2020-09-27 ENCOUNTER — Ambulatory Visit: Payer: Medicare Other | Admitting: Podiatry

## 2020-10-11 ENCOUNTER — Ambulatory Visit (INDEPENDENT_AMBULATORY_CARE_PROVIDER_SITE_OTHER): Payer: Medicare Other | Admitting: Podiatry

## 2020-10-11 ENCOUNTER — Other Ambulatory Visit: Payer: Self-pay

## 2020-10-11 DIAGNOSIS — L97521 Non-pressure chronic ulcer of other part of left foot limited to breakdown of skin: Secondary | ICD-10-CM | POA: Diagnosis not present

## 2020-10-12 NOTE — Progress Notes (Signed)
Subjective: 54 year old female presents the office today for follow-up evaluation of an ulceration of the left big toe.  He thinks the toe is doing better.  She is asking if I can further offload her AFO brace to offload the big toe.  Otherwise she said the president fitting well.  She denies any drainage or pus or apparent redness to the toe.  Does not appreciate any fevers, chills, nausea, vomiting.  No calf pain, chest pain, shortness of breath.   Objective: AAO x3, NAD-presents with caregiver DP/PT pulses palpable bilaterally, CRT less than 3 seconds Sensation decreased with Semmes Weinstein monofilament There is mild hyperkeratotic tissue on the plantar aspect left hallux after debridement there is a wound today which measures 0.5 x 0.2 x 0.1 cm.  There is no probing, undermining or tunneling.  There is no fluctuation crepitation peer there is no malodor. Brace appears to be fitting well however the pad that was placed previously has worn off with the offloading of the hallux. Dropfoot present left side No pain with calf compression, swelling, warmth, erythema  Assessment: Ulceration left foot; dropfoot  Plan: -All treatment options discussed with the patient including all alternatives, risks, complications.  -Debrided the wound today utilizing a number 312 with scalpel to any complications down to healthy, bleeding, granular tissue removed nonviable devitalized tissue.  Continue with daily dressing changes with medihoney.   -Brace appears to be fitting well but we did further modify this to help offload the big toe ulcer.  Return in about 3 weeks (around 11/01/2020).  Trula Slade DPM

## 2020-10-25 ENCOUNTER — Other Ambulatory Visit: Payer: Self-pay

## 2020-10-25 ENCOUNTER — Ambulatory Visit (INDEPENDENT_AMBULATORY_CARE_PROVIDER_SITE_OTHER): Payer: Medicare Other | Admitting: Podiatry

## 2020-10-25 ENCOUNTER — Encounter: Payer: Self-pay | Admitting: Podiatry

## 2020-10-25 DIAGNOSIS — L97521 Non-pressure chronic ulcer of other part of left foot limited to breakdown of skin: Secondary | ICD-10-CM

## 2020-10-31 NOTE — Progress Notes (Signed)
Subjective: 54 year old female presents the office today for follow-up evaluation of an ulceration of the left big toe.  She has some bloody drainage at times but denies any pus.  No increase in swelling or redness or any drainage otherwise.  She said the brace is fitting and doing well for her.  Denies any fevers, chills, nausea, vomiting.  No calf pain, chest pain, shortness of breath.   Objective: AAO x3, NAD-presents with caregiver DP/PT pulses palpable bilaterally, CRT less than 3 seconds Sensation decreased with Semmes Weinstein monofilament There is mild hyperkeratotic tissue on the plantar aspect left hallux after debridement there is a wound today which measures 0.3 x 0.2 x 0.1 cm.  There is no probing, undermining or tunneling.  There is no fluctuation crepitation peer there is no malodor. Dropfoot present left side No pain with calf compression, swelling, warmth, erythema  Assessment: Ulceration left foot; dropfoot  Plan: -All treatment options discussed with the patient including all alternatives, risks, complications.  -Debrided the wound today utilizing a number 312 with scalpel to any complications down to healthy, bleeding, granular tissue removed nonviable devitalized tissue to promote wound healing.  Continue with daily dressing changes with medihoney.   -Brace appears to be fitting well but we did further modify this to help offload the big toe ulcer.   Trula Slade DPM

## 2020-11-05 ENCOUNTER — Other Ambulatory Visit: Payer: Self-pay | Admitting: Neurology

## 2020-11-09 ENCOUNTER — Telehealth: Payer: Self-pay

## 2020-11-09 NOTE — Telephone Encounter (Signed)
Lovesh called from [facility] and wanted to see if Dr. Posey Pronto could consider changing patients infusion dosage up 1.5?But to keep infusions at 21 days. The nurse stated that when patient comes in on day 1 for her  infusion patient is not well and has SOB, wheezing and her lung capacity is poor. However, by the time patient comes in for her 3rd infusion she has alittle improvement with her lung capacity, SOB, and wheezing. The nurse states that she thinks it would be beneficial for patient to have her dosage increased for her infusion. Informed Lovesh that I would send Dr. Posey Pronto a message and inform her of this and reach back out once I hear back.

## 2020-11-10 NOTE — Telephone Encounter (Signed)
Her neuropathy would not cause shortness of breath/wheezing, so best that she see her PCP or pulmonologist to better understand the nature of this.  Keep IVIG at 1mg /kg - her current dose.  Thanks.

## 2020-11-11 NOTE — Telephone Encounter (Signed)
Called and left a message for [facility] for a call back.

## 2020-11-15 ENCOUNTER — Other Ambulatory Visit: Payer: Self-pay

## 2020-11-15 ENCOUNTER — Ambulatory Visit (INDEPENDENT_AMBULATORY_CARE_PROVIDER_SITE_OTHER): Payer: Medicare Other | Admitting: Podiatry

## 2020-11-15 DIAGNOSIS — L97521 Non-pressure chronic ulcer of other part of left foot limited to breakdown of skin: Secondary | ICD-10-CM

## 2020-11-16 ENCOUNTER — Other Ambulatory Visit: Payer: Self-pay

## 2020-11-16 ENCOUNTER — Telehealth: Payer: Self-pay | Admitting: Internal Medicine

## 2020-11-16 MED ORDER — HYOSCYAMINE SULFATE 0.125 MG SL SUBL: SUBLINGUAL_TABLET | SUBLINGUAL | 2 refills | Status: DC

## 2020-11-16 NOTE — Telephone Encounter (Signed)
Called pt to check up on her. She states she has been having hypotension in the late afternoon/early evening. She has been taking 40mg  lasix qd in the AM in addition to her BP medications taken at night. She states she wakes with BP's in the 150's, 130's mid afternoon, then low 100's in the PM. She is symptomatic with the low Bps in the afternoon.   Since her last episode of syncope on Friday 12/3, she has been holding her lasix unless she has a weight gain of >3lbs. Then she will take 20mg  lasix.  I advised her I believed her lasix may be the reason she has been so symptomatic as her lasix is written PRN and she was taken it qd. I advised her to continue what she is doing since she has seen some improvement in symptoms.  She would like to see Dr. Caryl Comes vs Joseph Art, but she understands his schedule is busy. I will forward to Dr. Caryl Comes and his RN to review in case there is a cancellation in his schedule. Otherwise, plan to see Renee next week.

## 2020-11-16 NOTE — Telephone Encounter (Signed)
I think the diuretics may be culprit  And taking the diuretic as needed is a good approach  if she is not usiong compression may be particularly valuable on those days

## 2020-11-16 NOTE — Telephone Encounter (Signed)
  Pt c/o Syncope: STAT if syncope occurred within 30 minutes and pt complains of lightheadedness [place] if episode of passing out, completely, today or in last 24 hours   1. Did you pass out today? no  2. When is the last time you passed out? Friday night 11/11/20  3. Has this occurred multiple times? Pt said she has passed out 5 times in the past 3 weeks (since 11/07/20)  4. Did you have any symptoms prior to passing out? Patient said sometimes she gets dizzy or nauseated and has the opportunity to get to a safe place to sit down or lay down before passing out. Other times she says she does not get any warning and will just wake up on the floor.  3 falls she did have warning, two falls she did not.   Patient said she wanted to get an appointment to see Dr. Caryl Comes to discuss her symptoms and fainting spells. She is reluctant to see one of the APPs due to the complexity of her case but agreed to an appointment with Tommye Standard Friday 11/25/20 at 8:45.  Patient wanted to make Dr. Caryl Comes aware of her symptoms and hopefully get some advice before then

## 2020-11-17 ENCOUNTER — Telehealth: Payer: Self-pay | Admitting: Gastroenterology

## 2020-11-17 ENCOUNTER — Telehealth: Payer: Self-pay

## 2020-11-17 DIAGNOSIS — R0602 Shortness of breath: Secondary | ICD-10-CM

## 2020-11-17 DIAGNOSIS — R062 Wheezing: Secondary | ICD-10-CM

## 2020-11-17 NOTE — Telephone Encounter (Signed)
Patient called with concerns in regards to increasing her infusions. Spoke to patient for roughly 15 minutes.  Patient recently saw her PCP who listened to her lungs and informed patient that her lung capacity is decreased and wanted patient to contact Dr. Posey Pronto to see what she wants done. Patient stated if Dr. Posey Pronto is concerned about her lung issues what would she like done? I informed patient that per Dr. Posey Pronto patient will need to see a pulmonologist or her PCP in regards to her lungs to ensure there is no other issues going on. Informed patient that Dr. Posey Pronto does not want to increase her IVIG until her lung issues are addressed. Patient stated she has had no pulmonary test and no imaging of her lungs.  I asked patient when lung issues began and patient stated: " I have SOB with exertion." She could not specify when lung issues began.  SOB occurs with exertion (side note: as I was speaking to patient I could hear alit of wheezing in between breaths).  Patient would like to know who has to send the referral? What study does she need done? And what all does Dr. Posey Pronto want?  I

## 2020-11-17 NOTE — Telephone Encounter (Signed)
They are many things which can cause shortness of breath, and for this reason, we will send her to see a pulmonologist for evaluation.  They will decide the appropriate testing based on their expertise.

## 2020-11-17 NOTE — Telephone Encounter (Signed)
Inbound call from patient requesting additional refills for Zofran medication.  States was informed by pharmacy that she has no more refills.  Can be sent to [facility] in [place].

## 2020-11-17 NOTE — Telephone Encounter (Signed)
Called and spoke to patient and informed her of Dr. Serita Grit recommendations. Patient verbalized understanding and would like the referral to be sent to [facility]. Patient understands that infusion dose will remain the same.  Referral has been sent to [facility].

## 2020-11-18 ENCOUNTER — Other Ambulatory Visit: Payer: Self-pay

## 2020-11-18 ENCOUNTER — Other Ambulatory Visit: Payer: Self-pay | Admitting: Nephrology

## 2020-11-18 ENCOUNTER — Ambulatory Visit
Admission: RE | Admit: 2020-11-18 | Discharge: 2020-11-18 | Disposition: A | Discharge status: Home / Self Care | Payer: Medicare Other | Source: Ambulatory Visit | Attending: Nephrology | Admitting: Nephrology

## 2020-11-18 DIAGNOSIS — R0689 Other abnormalities of breathing: Secondary | ICD-10-CM

## 2020-11-21 NOTE — Telephone Encounter (Signed)
Attempted phone call to pt.  Left voicemail message to contact RN at [phone]. 

## 2020-11-21 NOTE — Progress Notes (Signed)
Subjective: 54 year old female presents the office today for follow-up evaluation of an ulceration of the left big toe.  She has noticed some dried blood along the bottom of the toe but no opening otherwise no pus.  No increase in swelling or redness.  She still wearing the brace.  She has no other concerns today.  Currently denies any fevers, chills, nausea. No calf pain, chest pain, shortness of breath.   Objective: AAO x3, NAD-presents with caregiver DP/PT pulses palpable bilaterally, CRT less than 3 seconds Sensation decreased with Semmes Weinstein monofilament There is hyperkeratotic tissue on the plantar aspect left hallux after debridement appears the wound is almost fully healed.  There is more of a skin fissure that was present today which looks like it had bled some there is some dried blood present but upon debridement there is no definitive open sore otherwise.  There is no edema, erythema or signs of infection noted today. Dropfoot present left side No pain with calf compression, swelling, warmth, erythema  Assessment: Ulceration left foot; dropfoot  Plan: -All treatment options discussed with the patient including all alternatives, risks, complications.  -Debrided the wound today utilizing a number 312 with scalpel to any complications down to healthy, bleeding, granular tissue removed nonviable devitalized tissue to promote wound healing.  Continue with daily dressing changes discussed with mom and Vaseline given the dryness. -Continue brace   Trula Slade DPM

## 2020-11-21 NOTE — Telephone Encounter (Signed)
Would you like to give additional refills for this medication?

## 2020-11-22 MED ORDER — ONDANSETRON 8 MG PO TBDP: 8.0000 mg | ORAL_TABLET | ORAL | 4 refills | Status: DC

## 2020-11-22 MED ORDER — ONDANSETRON 8 MG PO TBDP: 8.0000 mg | ORAL_TABLET | ORAL | 0 refills | Status: DC

## 2020-11-22 NOTE — Telephone Encounter (Signed)
Spoke with pt and advised per Dr Caryl Comes pt may take diuretic as needed and he recommends compression wear especially on days she takes diuretic.  Pt has appt with Tommye Standard, PA-C 11/25/2021.  Pt verbalizes understanding and agrees with current plan.

## 2020-11-22 NOTE — Telephone Encounter (Signed)
[name] is returning [name]'s call. Please advise.

## 2020-11-22 NOTE — Telephone Encounter (Signed)
Please do give her 4 refills RG

## 2020-11-22 NOTE — Telephone Encounter (Signed)
I have sent refills to patients pharmacy.  

## 2020-11-24 ENCOUNTER — Ambulatory Visit (INDEPENDENT_AMBULATORY_CARE_PROVIDER_SITE_OTHER): Payer: Medicare Other | Admitting: Pulmonary Disease

## 2020-11-24 ENCOUNTER — Encounter: Payer: Self-pay | Admitting: Pulmonary Disease

## 2020-11-24 ENCOUNTER — Other Ambulatory Visit: Payer: Self-pay

## 2020-11-24 VITALS — BP 110/64 | HR 102 | Temp 97.2°F | Ht 66.5 in | Wt 288.2 lb

## 2020-11-24 DIAGNOSIS — G6181 Chronic inflammatory demyelinating polyneuritis: Secondary | ICD-10-CM

## 2020-11-24 DIAGNOSIS — R0683 Snoring: Secondary | ICD-10-CM | POA: Diagnosis not present

## 2020-11-24 DIAGNOSIS — J986 Disorders of diaphragm: Secondary | ICD-10-CM | POA: Diagnosis not present

## 2020-11-24 DIAGNOSIS — I951 Orthostatic hypotension: Secondary | ICD-10-CM

## 2020-11-24 DIAGNOSIS — G4733 Obstructive sleep apnea (adult) (pediatric): Secondary | ICD-10-CM | POA: Insufficient documentation

## 2020-11-24 NOTE — Assessment & Plan Note (Signed)
From pulmonary standpoint, no objection to changing dose of IV IgG.  Decreased breath sounds on the right lung is not related.

## 2020-11-24 NOTE — Patient Instructions (Signed)
  Your right diaphragm appears to be elevated which explains decreased breath sounds on the right.  Schedule sniff test under fluoroscopy. Incentive spirometer -use twice daily 5-10 times  Schedule PFTs to establish baseline.  Obtain sleep studies from Dr. Brett Fairy. Schedule home sleep test

## 2020-11-24 NOTE — Assessment & Plan Note (Signed)
Dose of Prinivil was increased from 10 to 30 mg and she is having more falls.  She will discuss with Dr. Hollie Salk

## 2020-11-24 NOTE — Progress Notes (Signed)
Subjective:    Patient ID: [name], female    DOB: 08/30/1966, 54 y.o.   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  CIDP was diagnosed when she presented with foot drop and loss of sensation BL legs and paresthesias-  IVIG started in February 2019 and on IVIG 1g/kg every 3 weeks via port-a-cath.    Her visiting nurses noted decreased breath sounds on the right.  The requested increase of her IV IgG dose and neurology referred for pulmonary evaluation. She also reports shortness of breath on exertion -with walking 50 feet with a walker.  She reports 2 pillow orthopnea, she sleeps on a wedge with 2 pillows on it. She has gained 40 pounds in the last 5 years.  Chest x-ray 11/18/2020 independently reviewed shows low volumes elevated right hemidiaphragm. Chest x-ray 02/2015 also shows lower volumes and similar mild elevation of right hemidiaphragm.  PMH -CIDP as above, dysautonomia Caryl Comes) , orthostatic hypotension with syncope and multiple falls, proteinuria/CKD Hollie Salk ) , hypertension, thyroid cancer status post thyroidectomy , fibromyalgia , IDDM on insulin pump   Bedtime is around 11:30 PM, her sleep is frequently interrupted due to fibromyalgia pain and spontaneous awakenings, out of bed by 6:30 AM when her husband leaves her work and then she will sleep again for another 3 hours  Past Medical History:  Diagnosis Date  . Anginal pain ([place])    chest pain  heart cath done  . Anxiety   . Cancer [facility])    thyroid cancer  . Chronic bronchitis ([place])    "get it q yr"  . Chronic inflammatory demyelinating polyneuropathy ([place])   . Chronic kidney disease    Proteinuria  . Dyspnea    sob with exertion  . Early cataract    right  . Fibromyalgia   . GERD (gastroesophageal reflux disease)   . Heart murmur   . Hepatic adenoma   . High cholesterol   . History of gout   . History of hiatal hernia   . History of kidney  stones   . Hypertension   . Hypothyroidism   . IBS (irritable bowel syndrome)   . Iron deficiency anemia    "comes and goes"  . Migraine    "@ least once/month" (02/18/2015)  . Obesity   . PONV (postoperative nausea and vomiting)    patch and anti nausea meds work well  . Proteinuria   . Spinal headache   . Syncope   . Tachycardia   . Type II diabetes mellitus ([place])    pt has an insulin pump  . Wears glasses    Past Surgical History:  Procedure Laterality Date  . ABDOMINAL HYSTERECTOMY  ~2012   "lap"  . ANKLE RECONSTRUCTION Left 03/29/2016   Procedure: LEFT ANKLE LATERAL LIGAMENT RECONSTRUCTION AND PERONEAL TENDON REPAIR OR TENOLYSIS;  Surgeon: Wylene Simmer, MD;  Location: [facility];  Service: Orthopedics;  Laterality: Left;  . BACK SURGERY    . CARDIAC CATHETERIZATION  04/2009   "clean"  . COLONOSCOPY  01/12/2015   Colonic polyps status post polypectomy, small internal hemorrhoids. Otherwise normal. Bx: mild chronic gastritis.  . CYSTOSCOPY WITH URETEROSCOPY, STONE BASKETRY AND STENT PLACEMENT  ~ 2006  . ESOPHAGOGASTRODUODENOSCOPY  07/21/2015   Esophageal stricture status post esophageal dilatation. Mild gastritis.  Marland Kitchen insulin pump     U 500  . IR IMAGING GUIDED PORT INSERTION  09/22/2018  . IRRIGATION AND DEBRIDEMENT  KNEE Left 04/03/2018   Procedure: Arthroscopic versus open irrigation and debridement  left knee;  Surgeon: Nicholes Stairs, MD;  Location: [facility];  Service: Orthopedics;  Laterality: Left;  . LAPAROSCOPIC CHOLECYSTECTOMY  05/2004  . LUMBAR DISC SURGERY  08/2011  . SURAL NERVE BX Left 09/30/2017   Procedure: LEFT SURAL NERVE AND MUSCLE BIOPSY;  Surgeon: Newman Pies, MD;  Location: [facility];  Service: Neurosurgery;  Laterality: Left;  LEFT SURAL NERVE AND MUSCLE BIOPSY  . THYROIDECTOMY N/A 02/18/2015   Procedure: TOTAL THYROIDECTOMY;  Surgeon: [name], MD;  Location: [facility];  Service: General;  Laterality: N/A;  . TOTAL THYROIDECTOMY  02/18/2015    Allergies   Allergen Reactions  . Penicillins Anaphylaxis, Shortness Of Breath, Swelling, Palpitations and Other (See Comments)    Has patient had a PCN reaction causing immediate rash, facial/tongue/throat swelling, SOB or lightheadedness with hypotension: Yes Has patient had a PCN reaction causing severe rash involving mucus membranes or skin necrosis: No Has patient had a PCN reaction that required hospitalization No Has patient had a PCN reaction occurring within the last 10 years: No If all of the above answers are "NO", then may proceed with Cephalosporin use.   . Clarithromycin Nausea And Vomiting and Other (See Comments)    Any antibiotic (doxyclycine, etc)  . Other Other (See Comments)    All antibiotics makes patient sick  . Adhesive [Tape] Itching and Other (See Comments)    Redness  . Latex Itching, Rash and Other (See Comments)    Blisters with prolonged contact  . Moxifloxacin Nausea And Vomiting and Other (See Comments)    Needs Zofran or Phenergan    Social History   Socioeconomic History  . Marital status: Married    Spouse name: Not on file  . Number of children: Not on file  . Years of education: Not on file  . Highest education level: Not on file  Occupational History  . Occupation: Programmer, multimedia: [employer]  Tobacco Use  . Smoking status: Never Smoker  . Smokeless tobacco: Never Used  Vaping Use  . Vaping Use: Never used  Substance and Sexual Activity  . Alcohol use: No  . Drug use: No  . Sexual activity: Yes    Comment: partial hysterectomy  Other Topics Concern  . Not on file  Social History Narrative   Lives with husband in a one story home.  No children.  RN.  Education: college.    Social Determinants of Health   Financial Resource Strain: Not on file  Food Insecurity: Not on file  Transportation Needs: Not on file  Physical Activity: Not on file  Stress: Not on file  Social Connections: Not on file  Intimate Partner Violence: Not on file      Family History  Problem Relation Age of Onset  . Hypertension Mother   . Emphysema Mother   . AAA (abdominal aortic aneurysm) Mother   . Hypertension Father   . Coronary artery disease Father   . Cancer - Prostate Father   . Renal Disease Father   . Hypertension Brother   . Colon cancer Neg Hx   . Esophageal cancer Neg Hx   . Liver cancer Neg Hx   . Pancreatic cancer Neg Hx   . Rectal cancer Neg Hx   . Stomach cancer Neg Hx      Review of Systems  Shortness of breath with activity 2 pillow orthopnea Acid heartburn Dry cough Difficulty  swallowing Dental problems, infected right molar -has been evaluated by dentist and awaiting extraction. Headaches Anxiety Feet swelling Joint stiffness Palpitations Paresthesias, frequent falls    Objective:   Physical Exam  Gen. Pleasant, obese, in no distress, normal affect ENT - no pallor,icterus, no post nasal drip, class 2-3 airway Neck: No JVD, no thyromegaly, no carotid bruits Lungs: no use of accessory muscles, no dullness to percussion, decreased without rales or rhonchi  Cardiovascular: Rhythm regular, heart sounds  normal, no murmurs or gallops, no peripheral edema Abdomen: soft and non-tender, no hepatosplenomegaly, BS normal. Musculoskeletal: No deformities, no cyanosis or clubbing Neuro:  alert, non focal, no tremors, power 4+/5 all 4 Es, decreased sensation both legs below knees        Assessment & Plan:

## 2020-11-24 NOTE — Assessment & Plan Note (Signed)
Obtain sleep studies from Dr. Brett Fairy. Schedule home sleep test   The pathophysiology of obstructive sleep apnea , it's cardiovascular consequences & modes of treatment including CPAP were discused with the patient in detail & they evidenced understanding.

## 2020-11-24 NOTE — Progress Notes (Addendum)
Cardiology Office Note Date:  11/24/2020  Patient ID:  [name], [name] 07/07/1966, MRN [id number] PCP:  Reynold Bowen, MD  Cardiologist/Electrophysiologist: Dr. Caryl Comes    Chief Complaint:  recurrent syncope  History of Present Illness: [name] is a 53 y.o. female with history of HTN, known orthostatic hypotension, syncope, DM (insulin pump), chronic inflammatory demyelating polyneuropathy, fibromyalgia, thyroid cancer s/p thyroidectomy > hypothyroidism, CKD (chart mentions proteinuria), .  She comes today to be seen for Dr. Caryl Comes, last seen by him 01/12/20 via tele health.  Notes her BP medicines managed by her nephrologist, had increased lightheadedness, increased HRs,  and weakness associated with lisinopril 20mg  dose.   "I will discuss with Dr. Hollie Salk whether we just let her blood pressure right a little higher and use the lisinopril 10 mg.  The other question is whether we might be able to use Mestinon. Encouraged her to use isometric contraction, remember to stay out of bed.  Think about getting CuBII for exercise" reccommended another 78mo virtual visit.  I don't see any further visits.  11/16/20 phone notes reviewed, c/o recurrent syncope, and falls some with and without warning, reluctant to see APP given complexity of her case, though agreeable to if needed, with Dr. Olin Pia input hopefully. RN in discussion with her noted that she had  been taking 40mg  lasix qd in the AM in addition to her BP medications taken at night. She states she wakes with BP's in the 150's, 130's mid afternoon, then low 100's in the PM. She is symptomatic with the low Bps in the afternoon.  Since her last episode of syncope on Friday 12/3, she has been holding her lasix unless she has a weight gain of >3lbs. Then she will take 20mg  lasix. Advised to continue PRN lasix, Dr. Caryl Comes agreed, and recommended that on days she needs to take lasix she [place] compression wear.  11/17/20 is a phone  note to her neurologist requesting an increase in her IVIG infusions, stating that she had developed SOB/DOE and her PMD found her luncg capacity decreased at her visit, she was recommended to get a pulmonary referral, pending [facility] pulmonology eval.  TODAY She comes today accompanied by her husband. She has a long and complicated PMHx and does a good job trying t catch me up.  I note that her medicine/supplement list is quite lengthy, I count 33 She sees Dr. Caryl Comes Dr. Hollie Salk (nephrology) Dr. Posey Pronto (neurology) Dr. Elsworth Soho (pulmonolgy is new)  Dr. Forde Dandy (endocrinology/PMD)  She has a long hx of near syncope and syncope.  Largely though to be 2/2 orthostatic hypotension, she also mentions some mention of POTS/autonaumic dysfunction.  Her symptoms tend t wax/wanemmentioning that she tends to live life in 21 day blocks which is her IVIG ose schedule.  That she tends to feel a bit wiped out immediately afterwards but hen the best as her best can be for a couple weeks, then the 3rd week her symptoms start to mount again.  A month or so ago she saw nephrology, noting increased proteinuria her zestril was increased to 20mg  at HS, at a 2nd f/u from this with no improvement increased to 30mg , and since then started to have increased symptoms of profound weakness with a number of falls and syncope both. Her zestril subsequently has been reduced to 20mg   There is a new element of SOB, I am unable to nail this symptom down, to any particular pattern, not notably exertional, and no symptoms of  PND or orthopnea, she states RN at the infusing clinic noted reduced R lung sounds, she has been referred to Dr. Elsworth Soho, and relays that her R lung is smaller then the left and her diaphragm is elevated on the right, she is pending sleep study, sniff test, and PFTs.  Says that Dr. Elsworth Soho did not suspect a cardiovascular problem  (she has mechanical falls 2/2 her neuropathy and poor balance, she also reports syncope. Syncopal  events of late can be her usual that have some warning though not usually enough to avoid falling, and new that are sudden and just finds herself on the floor. None associated with palpitations, diaphoresis. All associate with post syncope weakness. She has also noted a new pattern of late that after lunch she feels very weak and tired.  Her blood sugars have been OK, mentions her A1c is 7, and if her blood sugar is low, that this is a very differnet set of symptoms. She mentions that her invokana is going to be changes to [place].  I have asked her to discuss possible diuresis associated with this with her endocrinologist and nephrologist, and me mindful of this potential side effect.  She reports her HOB already propped up and sleeps with a wedge and pillows.wears spanks/abdominal compression wear when OOB Tries to make sure she has time up an on her feet as well She can not wear thigh sleeves because she developed skin abrasions/wounds with them.  She was tried on Mestinon though developed severe headaches.  She is using lasix only PRN for weight gain/edema.  No CP   Past Medical History:  Diagnosis Date  . Anginal pain ([place])    chest pain  heart cath done  . Anxiety   . Cancer [facility])    thyroid cancer  . Chronic bronchitis ([place])    "get it q yr"  . Chronic inflammatory demyelinating polyneuropathy ([place])   . Chronic kidney disease    Proteinuria  . Dyspnea    sob with exertion  . Early cataract    right  . Fibromyalgia   . GERD (gastroesophageal reflux disease)   . Heart murmur   . Hepatic adenoma   . High cholesterol   . History of gout   . History of hiatal hernia   . History of kidney stones   . Hypertension   . Hypothyroidism   . IBS (irritable bowel syndrome)   . Iron deficiency anemia    "comes and goes"  . Migraine    "@ least once/month" (02/18/2015)  . Obesity   . PONV (postoperative nausea and vomiting)    patch and anti nausea meds work well  .  Proteinuria   . Spinal headache   . Syncope   . Tachycardia   . Type II diabetes mellitus ([place])    pt has an insulin pump  . Wears glasses     Past Surgical History:  Procedure Laterality Date  . ABDOMINAL HYSTERECTOMY  ~2012   "lap"  . ANKLE RECONSTRUCTION Left 03/29/2016   Procedure: LEFT ANKLE LATERAL LIGAMENT RECONSTRUCTION AND PERONEAL TENDON REPAIR OR TENOLYSIS;  Surgeon: Wylene Simmer, MD;  Location: [place];  Service: Orthopedics;  Laterality: Left;  . BACK SURGERY    . CARDIAC CATHETERIZATION  04/2009   "clean"  . COLONOSCOPY  01/12/2015   Colonic polyps status post polypectomy, small internal hemorrhoids. Otherwise normal. Bx: mild chronic gastritis.  . CYSTOSCOPY WITH URETEROSCOPY, STONE BASKETRY AND STENT PLACEMENT  ~ 2006  . ESOPHAGOGASTRODUODENOSCOPY  07/21/2015   Esophageal stricture status post esophageal dilatation. Mild gastritis.  Marland Kitchen insulin pump     U 500  . IR IMAGING GUIDED PORT INSERTION  09/22/2018  . IRRIGATION AND DEBRIDEMENT KNEE Left 04/03/2018   Procedure: Arthroscopic versus open irrigation and debridement  left knee;  Surgeon: Nicholes Stairs, MD;  Location: [place];  Service: Orthopedics;  Laterality: Left;  . LAPAROSCOPIC CHOLECYSTECTOMY  05/2004  . LUMBAR DISC SURGERY  08/2011  . SURAL NERVE BX Left 09/30/2017   Procedure: LEFT SURAL NERVE AND MUSCLE BIOPSY;  Surgeon: Newman Pies, MD;  Location: [place];  Service: Neurosurgery;  Laterality: Left;  LEFT SURAL NERVE AND MUSCLE BIOPSY  . THYROIDECTOMY N/A 02/18/2015   Procedure: TOTAL THYROIDECTOMY;  Surgeon: [name], MD;  Location: [place];  Service: General;  Laterality: N/A;  . TOTAL THYROIDECTOMY  02/18/2015    Current Outpatient Medications  Medication Sig Dispense Refill  . acetaminophen (TYLENOL) 500 MG tablet Take 1,000 mg by mouth every 6 (six) hours as needed for moderate pain or headache.     . ALPRAZolam (XANAX) 0.5 MG tablet Take 0.5 mg by mouth every 6 (six) hours as needed for anxiety  or sleep.     . APPLE CIDER VINEGAR PO Take 1 each by mouth daily. One gummy once a day    . aspirin EC 81 MG tablet Take 81 mg by mouth daily.    Marland Kitchen atorvastatin (LIPITOR) 20 MG tablet Take 20 mg by mouth daily at 6 PM.     . b complex vitamins tablet Take 1 tablet by mouth daily.    Pleas Patricia Microlet Lancets lancets 4 each by Other route 4 (four) times daily.    . cetirizine (ZYRTEC) 10 MG tablet Take 10 mg by mouth at bedtime.    . clobetasol cream (TEMOVATE) 9.56 % Apply 1 application topically 2 (two) times daily as needed.     . cyclobenzaprine (FLEXERIL) 10 MG tablet Take 10 mg by mouth See admin instructions. Take 10 mg by mouth at bedtime. Take 10 mg by mouth every 8 hours as needed for muscle spasms    . D3-50 50000 units capsule Take 50,000 Units by mouth every Saturday.  4  . diclofenac Sodium (VOLTAREN) 1 % GEL APPLY 2 GRAMS TOPICALLY 4 TIMES DAILY. RUB INTO AFFECTED AREA OF FOOT 2-4 TIMES DAILY. 100 g 0  . dicyclomine (BENTYL) 10 MG capsule Take 1 capsule (10 mg total) by mouth 4 (four) times daily as needed for spasms. 120 capsule 11  . doxycycline (PERIOSTAT) 20 MG tablet Take 20 mg by mouth 2 (two) times daily.   3  . DULoxetine (CYMBALTA) 60 MG capsule Take 60 mg by mouth daily.    . fluticasone (FLONASE) 50 MCG/ACT nasal spray Place 1 spray into both nostrils at bedtime.     . furosemide (LASIX) 40 MG tablet Take 40 mg by mouth daily. As needed for SOB or leg swelling    . gabapentin (NEURONTIN) 300 MG capsule TAKE 2 CAPSULES BY MOUTH THREE TIMES DAILY 540 capsule 0  . glucose blood (CONTOUR NEXT TEST) test strip 4 each by Other route 4 (four) times daily.    Marland Kitchen HYDROcodone-acetaminophen (NORCO) 7.5-325 MG tablet Take 1-2 tablets by mouth every 6 (six) hours as needed for moderate pain (for pain.). 40 tablet 0  . hyoscyamine (LEVSIN SL) 0.125 MG SL tablet Take 1-2 tablets under the tongue every 6-8 hours as needed. 180 tablet 2  .  immune globulin, human, (GAMMAGARD S/D LESS IGA)  10 g injection Inject 40 g into the vein every 21 ( twenty-one) days. 40 mg daily for 3 days every 3 weeks    . Insulin Human (INSULIN PUMP) SOLN Inject into the skin.    Marland Kitchen insulin regular human CONCENTRATED (HUMULIN R) 500 UNIT/ML injection Inject 2 Units into the skin See admin instructions. 2.0 units /hour 0500-2359, 1.95units 0160-1093 with carb ratio 1:50 for meal time bolus    . INVOKAMET 50-1000 MG TABS Take 1 tablet by mouth 2 (two) times daily.   5  . ketoconazole (NIZORAL) 2 % cream Apply 1 application topically 2 (two) times daily as needed (for toes).     Marland Kitchen levothyroxine (SYNTHROID, LEVOTHROID) 137 MCG tablet Take 274 mcg by mouth daily before breakfast.    . lisinopril (ZESTRIL) 20 MG tablet Take 20 mg by mouth daily.    . metoprolol succinate (TOPROL-XL) 50 MG 24 hr tablet Take 50 mg by mouth daily. Take with or immediately following a meal.    . metroNIDAZOLE (METROCREAM) 0.75 % cream Apply 1 application topically 2 (two) times daily.     . Multiple Vitamin (MULTIVITAMIN) tablet Take 1 tablet by mouth at bedtime.     . mupirocin ointment (BACTROBAN) 2 % Apply 1 application topically 2 (two) times daily. 30 g 2  . ondansetron (ZOFRAN-ODT) 8 MG disintegrating tablet Take 1 tablet (8 mg total) by mouth See admin instructions. Take 8 mg by mouth 3 times daily before meals. Take 8 mg by mouth every 6 hours as needed for nausea and vomiting. 45 tablet 4  . pantoprazole (PROTONIX) 40 MG tablet Take 1 tablet (40 mg total) by mouth 2 (two) times daily. 60 tablet 11  . PROCTOSOL HC 2.5 % rectal cream Apply 1 application topically 2 (two) times daily as needed for hemorrhoids.   2  . silver sulfADIAZINE (SILVADENE) 1 % cream Apply 1 application topically daily. 50 g 0  . silver sulfADIAZINE (SILVADENE) 1 % cream Apply 1 application topically daily. 50 g 0  . Turmeric (QC TUMERIC COMPLEX PO) Take 1 tablet by mouth daily.     No current facility-administered medications for this visit.     Allergies:   Penicillins, Clarithromycin, Other, Adhesive [tape], Latex, and Moxifloxacin   Social History:  The patient  reports that she has never smoked. She has never used smokeless tobacco. She reports that she does not drink alcohol and does not use drugs.   Family History:  [name] family history includes AAA (abdominal aortic aneurysm) in her mother; Cancer - Prostate in her father; Coronary artery disease in her father; Emphysema in her mother; Hypertension in her brother, father, and mother; Renal Disease in her father.  ROS:  Please see the history of present illness.    All other systems are reviewed and otherwise negative.   PHYSICAL EXAM:  VS:  There were no vitals taken for this visit. BMI: There is no height or weight on file to calculate BMI. Well nourished, well developed, in no acute distress HEENT: normocephalic, atraumatic Neck: no JVD, carotid bruits or masses Cardiac:  RRR; no significant murmurs, no rubs, or gallops Lungs:  CTA b/l, no wheezing, rhonchi or rales Abd: soft, nontender, significant central obesity MS: no deformity or atrophy Ext: no edema, some chronic looking skin changes Skin: warm and dry, no rash Neuro:  No gross deficits appreciated Psych: euthymic mood, full affect   EKG:  Done today and  reviewed by myself shows  SR 82bpm, no changes  01/23/2017: TTE Study Conclusions  - Left ventricle: The cavity size was normal. Wall thickness was  increased in a pattern of mild LVH. Systolic function was  vigorous. The estimated ejection fraction was in the range of 65%  to 70%. Wall motion was normal; there were no regional wall  motion abnormalities. Doppler parameters are consistent with  abnormal left ventricular relaxation (grade 1 diastolic  dysfunction).    Feb 2018: monitor Symptoms            Chest pain>> sinus 90-110            Flutters >> sinus tach 120            LH/dizziness>> sinus tach 100-140 // isolated  PVC All expected findings    Recent Labs: No results found for requested labs within last 8760 hours.  No results found for requested labs within last 8760 hours.   CrCl cannot be calculated (Patient's most recent lab result is older than the maximum 21 days allowed.).   Wt Readings from Last 3 Encounters:  08/11/20 289 lb 4 oz (131.2 kg)  08/05/20 282 lb (127.9 kg)  01/12/20 288 lb (130.6 kg)     Other studies reviewed: Additional studies/records reviewed today include: summarized above  ASSESSMENT AND PLAN:  1. HTN 2. Orthostatic hypotension 3. Recurrent syncope      Multiple medical problems, and is likely multifactorial.  Med/supplemet list is quite long, some are PRNs only  Given the complexity of her medical history and symptomatology I have elected to make no medicine changes to what has been done recently already, and will reach out to Dr. Caryl Comes. I have advised her though to avoid large meals  That may provoke vagal provocation She mentions a new syncope that is without warning, just wakes on the floor and will order a 2 week ZIo AT monitor and will update her echo  Plan to see Dr. Caryl Comes in a couple months in office or virtual I think would be OK  I spent 85minutes today with the patient   Disposition: as above  Current medicines are reviewed at length with the patient today.  The patient did not have any concerns regarding medicines.  Venetia Night, PA-C 11/24/2020 10:13 AM     [facility] [address] [place] [address] [phone] (office)  [phone] (fax)

## 2020-11-24 NOTE — Assessment & Plan Note (Signed)
right diaphragm appears to be elevated which explains decreased breath sounds on the right. Not sure if this is related to CIDP. Review of chest x-ray from 02/2015 also shows low lung volumes  Schedule sniff test under fluoroscopy -evaluate bilateral diaphragm Incentive spirometer -use twice daily 5-10 times  Schedule PFTs to establish baseline.  She does have restrictive lung disease

## 2020-11-25 ENCOUNTER — Ambulatory Visit (INDEPENDENT_AMBULATORY_CARE_PROVIDER_SITE_OTHER): Payer: Medicare Other

## 2020-11-25 ENCOUNTER — Ambulatory Visit (INDEPENDENT_AMBULATORY_CARE_PROVIDER_SITE_OTHER): Payer: Medicare Other | Admitting: Physician Assistant

## 2020-11-25 ENCOUNTER — Encounter: Payer: Self-pay | Admitting: Physician Assistant

## 2020-11-25 VITALS — BP 133/78 | HR 95 | Ht 66.5 in | Wt 291.4 lb

## 2020-11-25 DIAGNOSIS — I10 Essential (primary) hypertension: Secondary | ICD-10-CM | POA: Diagnosis not present

## 2020-11-25 DIAGNOSIS — I951 Orthostatic hypotension: Secondary | ICD-10-CM | POA: Diagnosis not present

## 2020-11-25 DIAGNOSIS — R55 Syncope and collapse: Secondary | ICD-10-CM | POA: Diagnosis not present

## 2020-11-25 NOTE — Patient Instructions (Addendum)
Medication Instructions:   *If you need a refill on your cardiac medications before your next appointment, please call your pharmacy*   Lab Work: NONE ORDERED  TODAY   If you have labs (blood work) drawn today and your tests are completely normal, you will receive your results only by: Marland Kitchen MyChart Message (if you have MyChart) OR . A paper copy in the mail If you have any lab test that is abnormal or we need to change your treatment, we will call you to review the results.   Testing/Procedures: Your physician has requested that you have an echocardiogram. Echocardiography is a painless test that uses sound waves to create images of your heart. It provides your doctor with information about the size and shape of your heart and how well your heart's chambers and valves are working. This procedure takes approximately one hour. There are no restrictions for this procedure.  Your physician has recommended that you wear an event monitor. Event monitors are medical devices that record the heart's electrical activity. Doctors most often [place] these monitors to diagnose arrhythmias. Arrhythmias are problems with the speed or rhythm of the heartbeat. The monitor is a small, portable device. You can wear one while you do your normal daily activities. This is usually used to diagnose what is causing palpitations/syncope (passing out).   Follow-Up: At [facility], you and your health needs are our priority.  As part of our continuing mission to provide you with exceptional heart care, we have created designated Provider Care Teams.  These Care Teams include your primary Cardiologist (physician) and Advanced Practice Providers (APPs -  Physician Assistants and Nurse Practitioners) who all work together to provide you with the care you need, when you need it.  We recommend signing up for the patient portal called "MyChart".  Sign up information is provided on this After Visit Summary.  MyChart is used to  connect with patients for Virtual Visits (Telemedicine).  Patients are able to view lab/test results, encounter notes, upcoming appointments, etc.  Non-urgent messages can be sent to your provider as well.   To learn more about what you can do with MyChart, go to [email or web address].    Your next appointment:   6 week(s)  The format for your next appointment:   Virtual  Provider:   Virl Axe, MD   Other Instructions  ZIO AT Long term monitor-Live Telemetry  Your physician has requested you wear a ZIO patch monitor for 14__ days.  This is a single patch monitor. [employer] supplies one patch monitor per enrollment. Additional stickers are not available.  Please do not apply patch if you will be having a Nuclear Stress Test, Echocardiogram, Cardiac CT, MRI, or Chest Xray during the time frame you would be wearing the monitor. The patch cannot be worn during these tests. You cannot remove and re-apply the ZIO AT patch monitor.   Your ZIO patch monitor will be sent Fed Ex from [employer] directly to your home address. The monitor may also be mailed to a PO BOX if home delivery is not available. It may take 3-5 days to receive your monitor after you have been enrolled.  Once you have received you monitor, please review enclosed instructions. Your monitor has already been registered assigning a specific monitor serial # to you.   Applying the monitor  Shave hair from upper left chest.  Hold abrader disc by orange tab. Rub abrader in 40 strokes over left upper chest as indicated in your  monitor instructions.  Clean area with 4 enclosed alcohol pads. Use all pads to ensure the area is cleaned thoroughly. Let dry.  Apply patch as indicated in monitor instructions. Patch will be placed under collarbone on left side of chest with arrow pointing upward.  Rub patch adhesive wings for 2 minutes. Remove the white label marked "1". Remove the white label marked "2". Rub patch adhesive wings  for 2 additional minutes.  While looking in a mirror, press and release button in center of patch. A small green light will flash 3-4 times. This will be your only indicator the monitor has been turned on.  Do not shower for the first 24 hours. You may shower after the first 24 hours.  Press the button if you feel a symptom. You will hear a small click. Record Date, Time and Symptom in the Patient Log.   Starting the Gateway  In your kit there is a Hydrographic surveyor box the size of a cellphone. This is Airline pilot. It transmits all your recorded data to [facility]. This box must stay within 10 feet of you at all times. Open the box and push the * button. There will be a light that blinks orange and then green a few times. When the light stops blinking, the Gateway is connected to the ZIO patch.  Call [employer] at [phone] to confirm your monitor is transmitting.   Returning your monitor  Remove your patch and place it inside the [place]. In the lower half of the Gateway there is a white bag with prepaid postage on it. Place Gateway in bag and seal. Mail package back to [place] as soon as possible. Your physician should have your final report approximately 7 days after you have mailed back your monitor.   Call [employer] at [phone] if you have questions regarding your ZIO AT patch monitor. Call them immediately if you see an orange light blinking on your monitor.  If your monitor falls off in less than 4 days contact our Monitor department at [phone]. If your monitor becomes loose or falls off after 4 days call [employer] at [phone] for suggestions on securing your monitor.

## 2020-11-29 ENCOUNTER — Ambulatory Visit: Payer: Medicare Other | Admitting: Podiatry

## 2020-11-29 DIAGNOSIS — R55 Syncope and collapse: Secondary | ICD-10-CM

## 2020-11-30 ENCOUNTER — Ambulatory Visit (HOSPITAL_COMMUNITY)
Admission: RE | Admit: 2020-11-30 | Discharge: 2020-11-30 | Disposition: A | Discharge status: Home / Self Care | Payer: Medicare Other | Source: Ambulatory Visit | Attending: Pulmonary Disease | Admitting: Pulmonary Disease

## 2020-11-30 ENCOUNTER — Other Ambulatory Visit: Payer: Self-pay

## 2020-11-30 DIAGNOSIS — G6181 Chronic inflammatory demyelinating polyneuritis: Secondary | ICD-10-CM | POA: Diagnosis present

## 2020-11-30 DIAGNOSIS — J986 Disorders of diaphragm: Secondary | ICD-10-CM | POA: Diagnosis present

## 2020-12-01 ENCOUNTER — Ambulatory Visit (INDEPENDENT_AMBULATORY_CARE_PROVIDER_SITE_OTHER): Payer: Medicare Other | Admitting: Podiatry

## 2020-12-01 DIAGNOSIS — L97521 Non-pressure chronic ulcer of other part of left foot limited to breakdown of skin: Secondary | ICD-10-CM

## 2020-12-01 DIAGNOSIS — E1149 Type 2 diabetes mellitus with other diabetic neurological complication: Secondary | ICD-10-CM

## 2020-12-01 DIAGNOSIS — E1140 Type 2 diabetes mellitus with diabetic neuropathy, unspecified: Secondary | ICD-10-CM

## 2020-12-01 NOTE — Progress Notes (Signed)
Called and went over sniff results per Dr Elsworth Soho with patient. All questions answered and patient expressed full understanding. Nothing further needed at this time.

## 2020-12-07 NOTE — Progress Notes (Signed)
Subjective: 54 year old female presents the office today for follow-up evaluation of an ulceration of the left big toe. She states that the toe is doing about the same that she notices. She has noticed some dried blood to the bottom but has not seen any drainage/pus. The brace is still fitting well. Currently denies any fevers, chills, nausea. No calf pain, chest pain, shortness of breath.   Objective: AAO x3, NAD-presents with caregiver DP/PT pulses palpable bilaterally, CRT less than 3 seconds Sensation decreased with Semmes Weinstein monofilament Hyperkeratotic tissue present on the plantar aspect left hallux.  Appears that the wound is healed but there is a skin fissure on the plantar aspect with dried blood.  Upon debridement there is no active drainage or pus.  There is no edema, erythema, ascending cellulitis.  There is no fluctuation crepitation peer there is no malodor. Dropfoot present left side No pain with calf compression, swelling, warmth, erythema  Assessment: Ulceration left foot; dropfoot  Plan: -All treatment options discussed with the patient including all alternatives, risks, complications.  -Debrided the wound today utilizing a number 312 with scalpel to any complications down to healthy, bleeding, granular tissue removed nonviable devitalized tissue to promote wound healing.  Discussed Vaseline to help moisturize the wound. -Continue brace   Vivi Barrack DPM

## 2020-12-13 ENCOUNTER — Ambulatory Visit (INDEPENDENT_AMBULATORY_CARE_PROVIDER_SITE_OTHER): Payer: Medicare Other | Admitting: Pulmonary Disease

## 2020-12-13 ENCOUNTER — Other Ambulatory Visit: Payer: Self-pay

## 2020-12-13 ENCOUNTER — Ambulatory Visit: Payer: Medicare Other | Admitting: Podiatry

## 2020-12-13 DIAGNOSIS — J986 Disorders of diaphragm: Secondary | ICD-10-CM | POA: Diagnosis not present

## 2020-12-13 DIAGNOSIS — G6181 Chronic inflammatory demyelinating polyneuritis: Secondary | ICD-10-CM

## 2020-12-13 LAB — PULMONARY FUNCTION TEST
DL/VA % pred: 146 %
DL/VA: 6.16 ml/min/mmHg/L
DLCO cor % pred: 108 %
DLCO cor: 24.15 ml/min/mmHg
DLCO unc % pred: 108 %
DLCO unc: 24.15 ml/min/mmHg
FEF 25-75 Post: 4.91 L/sec
FEF 25-75 Pre: 2.31 L/sec
FEF2575-%Change-Post: 112 %
FEF2575-%Pred-Post: 177 %
FEF2575-%Pred-Pre: 83 %
FEV1-%Change-Post: 29 %
FEV1-%Pred-Post: 81 %
FEV1-%Pred-Pre: 62 %
FEV1-Post: 2.37 L
FEV1-Pre: 1.83 L
FEV1FVC-%Change-Post: 2 %
FEV1FVC-%Pred-Pre: 106 %
FEV6-%Change-Post: 25 %
FEV6-%Pred-Post: 75 %
FEV6-%Pred-Pre: 59 %
FEV6-Post: 2.72 L
FEV6-Pre: 2.16 L
FEV6FVC-%Pred-Post: 102 %
FEV6FVC-%Pred-Pre: 102 %
FVC-%Change-Post: 25 %
FVC-%Pred-Post: 73 %
FVC-%Pred-Pre: 57 %
FVC-Post: 2.72 L
FVC-Pre: 2.16 L
Post FEV1/FVC ratio: 87 %
Post FEV6/FVC ratio: 100 %
Pre FEV1/FVC ratio: 85 %
Pre FEV6/FVC Ratio: 100 %
RV % pred: 78 %
RV: 1.53 L
TLC % pred: 80 %
TLC: 4.33 L

## 2020-12-13 NOTE — Progress Notes (Signed)
PFT done today. 

## 2020-12-14 MED ORDER — ALBUTEROL SULFATE HFA 108 (90 BASE) MCG/ACT IN AERS: 2.0000 | INHALATION_SPRAY | Freq: Four times a day (QID) | RESPIRATORY_TRACT | 6 refills | Status: DC | PRN

## 2020-12-14 NOTE — Progress Notes (Signed)
Called and went over PFT results and note per Dr Vassie Loll with patient. All questions answered and patient expressed full understanding. Patient agreeable to try albuterol MDI as needed and order placed to pharmacy per Dr Vassie Loll. Nothing further needed at this time.

## 2020-12-14 NOTE — Addendum Note (Signed)
Addended by: Melonie [place] on: 12/14/2020 03:52 PM   Modules accepted: Orders

## 2020-12-19 ENCOUNTER — Telehealth: Payer: Self-pay | Admitting: Pulmonary Disease

## 2020-12-20 MED ORDER — AEROCHAMBER MV MISC: 0 refills | Status: DC

## 2020-12-20 NOTE — Telephone Encounter (Signed)
Okay to send 

## 2020-12-20 NOTE — Telephone Encounter (Signed)
ATC patient to let her know that RX for spacer has been sent to preferred pharmacy. Advised her to call back with any questions. Nothing further needed at this time.

## 2020-12-20 NOTE — Telephone Encounter (Signed)
Called and spoke to pt. Pt is requesting a spacer for her albuterol hfa. Pt requesting script be sent to [facility] in [place].   Dr. Elsworth Soho, please advise. Thanks.

## 2020-12-21 ENCOUNTER — Encounter: Payer: Self-pay | Admitting: Neurology

## 2020-12-21 NOTE — Progress Notes (Signed)
Received voicemail from [place] that the Gamunex-C IVIG was approved from 12/20/20 to 12/20/21.

## 2020-12-22 ENCOUNTER — Other Ambulatory Visit: Payer: Self-pay

## 2020-12-22 ENCOUNTER — Ambulatory Visit: Payer: Medicare Other | Admitting: Podiatry

## 2020-12-22 ENCOUNTER — Ambulatory Visit (INDEPENDENT_AMBULATORY_CARE_PROVIDER_SITE_OTHER): Payer: Medicare Other

## 2020-12-22 DIAGNOSIS — L97521 Non-pressure chronic ulcer of other part of left foot limited to breakdown of skin: Secondary | ICD-10-CM

## 2020-12-22 DIAGNOSIS — L03032 Cellulitis of left toe: Secondary | ICD-10-CM

## 2020-12-22 MED ORDER — CEPHALEXIN 500 MG PO CAPS: 500.0000 mg | ORAL_CAPSULE | Freq: Three times a day (TID) | ORAL | 2 refills | Status: DC

## 2020-12-22 MED ORDER — MUPIROCIN 2 % EX OINT: 1.0000 "application " | TOPICAL_OINTMENT | Freq: Two times a day (BID) | CUTANEOUS | 2 refills | Status: DC

## 2020-12-26 ENCOUNTER — Other Ambulatory Visit (HOSPITAL_COMMUNITY): Payer: Medicare Other

## 2020-12-27 ENCOUNTER — Ambulatory Visit: Payer: Medicare Other | Admitting: Podiatry

## 2020-12-28 NOTE — Progress Notes (Signed)
Subjective: 55 year old female presents the office today for a same-day appointment for concerns of cellulitis to her left foot and leg.  She states that she had noticed some bleeding to the wound and she started to notice some increased swelling and mild redness of the foot and possibly the leg.  She does get some occasional pain to the big toe. Currently denies any fevers, chills, nausea. No calf pain, chest pain, shortness of breath.   Objective: AAO x3, NAD-presents with caregiver DP/PT pulses palpable bilaterally, CRT less than 3 seconds Sensation decreased with Semmes Weinstein monofilament Hyperkeratotic tissue present on the plantar aspect left hallux.  Blood present and appears that there is almost a skin fissure to the plantar aspect of the toe in the shape of a "L".  After debridement the wound is superficial without any probing, undermining or tunneling.  There is mild edema of the hallux.  There is no drainage or pus identified today.  There is slight swelling to the left foot and ankle however there is no significant ascending cellulitis there is no significant warmth to the foot. Dropfoot present left side No pain with calf compression, swelling, warmth, erythema  Assessment: Ulceration left foot; cellulitis  Plan: -All treatment options discussed with the patient including all alternatives, risks, complications.  -Sharply debrided the wound today utilizing a #312 scalpel debride nonviable hyperkeratotic tissue to healthy, granular tissue to remove nonviable devitalized tissue to promote wound healing.  Continue with mupirocin ointment dressing changes daily. -Rx Cephalexin -Elevation -Monitor for any clinical signs or symptoms of infection and directed to call the office immediately should any occur or go to the ER.  No follow-ups on file.  Trula Slade DPM

## 2020-12-29 ENCOUNTER — Telehealth: Payer: Self-pay

## 2020-12-29 ENCOUNTER — Telehealth (INDEPENDENT_AMBULATORY_CARE_PROVIDER_SITE_OTHER): Payer: Medicare Other | Admitting: Internal Medicine

## 2020-12-29 ENCOUNTER — Other Ambulatory Visit: Payer: Self-pay

## 2020-12-29 VITALS — BP 170/100 | HR 110 | Ht 66.0 in | Wt 288.0 lb

## 2020-12-29 DIAGNOSIS — I951 Orthostatic hypotension: Secondary | ICD-10-CM | POA: Diagnosis not present

## 2020-12-29 DIAGNOSIS — R55 Syncope and collapse: Secondary | ICD-10-CM

## 2020-12-29 NOTE — Patient Instructions (Signed)

## 2020-12-29 NOTE — Progress Notes (Signed)
Electrophysiology TeleHealth Note   Due to national recommendations of social distancing due to COVID 19, an audio/video telehealth visit is felt to be most appropriate for this patient at this time.  See MyChart message from today for the patient's consent to telehealth for [facility].   Date:  12/29/2020   ID:  [name], DOB 12-08-1966, MRN [id number]  Location: patient's home  Provider location: [address] [place]  Evaluation Performed: Follow-up visit  PCP:  Reynold Bowen, MD  Cardiologist:     Electrophysiologist:  SK   Chief Complaint:  Syncope and hypertension   History of Present Illness:    [name] is a 55 y.o. female who presents via audio/video conferencing for a telehealth visit today.  Since last being seen in our clinic for recurrent syncope with hypertension, documented orthostatic hypotension in the context of diabetes with likely some degree of neuropathy;and CIDP  the patient  Reports recurrent syncope for which she saw RU 12/21  Issues ongoing of IVIG infusions per neuro and "decreased lung capacity" for which pulm referral was made  Because of proteinuria, her lisinopril increased from 20>>40 and then returned to 20 2/2 falls and syncope without warning; also with post prandial weakness.  She takes an SGLT2 since at least 1/20   BP are elevated in the am and drift down during the day.  Takes metop and lisinopril in the PM (2200h) but does not take lisinopril if am BP < 150.   During her IVIG 21 day cycles, she finds BP is lower just before the infusions  Interval syncope, one while holding onto the walker, she has one with a seat but this one did not, some warning  More upright, unable to use CUBII because her foot slips off       Date Cr K Hgb  4/19 0.82 4.1 14.1 (10/19)     The patient denies symptoms of fevers, chills, cough, or new SOB worrisome for COVID 19.    Past Medical History:  Diagnosis Date  .  Anginal pain ([place])    chest pain  heart cath done  . Anxiety   . Cancer [facility])    thyroid cancer  . Chronic bronchitis ([place])    "get it q yr"  . Chronic inflammatory demyelinating polyneuropathy ([place])   . Chronic kidney disease    Proteinuria  . Dyspnea    sob with exertion  . Early cataract    right  . Fibromyalgia   . GERD (gastroesophageal reflux disease)   . Heart murmur   . Hepatic adenoma   . High cholesterol   . History of gout   . History of hiatal hernia   . History of kidney stones   . Hypertension   . Hypothyroidism   . IBS (irritable bowel syndrome)   . Iron deficiency anemia    "comes and goes"  . Migraine    "@ least once/month" (02/18/2015)  . Obesity   . PONV (postoperative nausea and vomiting)    patch and anti nausea meds work well  . Proteinuria   . Spinal headache   . Syncope   . Tachycardia   . Type II diabetes mellitus ([place])    pt has an insulin pump  . Wears glasses     Past Surgical History:  Procedure Laterality Date  . ABDOMINAL HYSTERECTOMY  ~2012   "lap"  . ANKLE RECONSTRUCTION Left 03/29/2016   Procedure: LEFT ANKLE LATERAL  LIGAMENT RECONSTRUCTION AND PERONEAL TENDON REPAIR OR TENOLYSIS;  Surgeon: Wylene Simmer, MD;  Location: [facility];  Service: Orthopedics;  Laterality: Left;  . BACK SURGERY    . CARDIAC CATHETERIZATION  04/2009   "clean"  . COLONOSCOPY  01/12/2015   Colonic polyps status post polypectomy, small internal hemorrhoids. Otherwise normal. Bx: mild chronic gastritis.  . CYSTOSCOPY WITH URETEROSCOPY, STONE BASKETRY AND STENT PLACEMENT  ~ 2006  . ESOPHAGOGASTRODUODENOSCOPY  07/21/2015   Esophageal stricture status post esophageal dilatation. Mild gastritis.  Marland Kitchen insulin pump     U 500  . IR IMAGING GUIDED PORT INSERTION  09/22/2018  . IRRIGATION AND DEBRIDEMENT KNEE Left 04/03/2018   Procedure: Arthroscopic versus open irrigation and debridement  left knee;  Surgeon: Nicholes Stairs, MD;  Location: [facility];  Service:  Orthopedics;  Laterality: Left;  . LAPAROSCOPIC CHOLECYSTECTOMY  05/2004  . LUMBAR DISC SURGERY  08/2011  . SURAL NERVE BX Left 09/30/2017   Procedure: LEFT SURAL NERVE AND MUSCLE BIOPSY;  Surgeon: Newman Pies, MD;  Location: [facility];  Service: Neurosurgery;  Laterality: Left;  LEFT SURAL NERVE AND MUSCLE BIOPSY  . THYROIDECTOMY N/A 02/18/2015   Procedure: TOTAL THYROIDECTOMY;  Surgeon: [name], MD;  Location: [facility];  Service: General;  Laterality: N/A;  . TOTAL THYROIDECTOMY  02/18/2015    Current Outpatient Medications  Medication Sig Dispense Refill  . acetaminophen (TYLENOL) 500 MG tablet Take 1,000 mg by mouth every 6 (six) hours as needed for moderate pain or headache.     . albuterol (VENTOLIN HFA) 108 (90 Base) MCG/ACT inhaler Inhale 2 puffs into the lungs every 6 (six) hours as needed for wheezing or shortness of breath. 8 g 6  . ALPRAZolam (XANAX) 0.5 MG tablet Take 0.5 mg by mouth every 6 (six) hours as needed for anxiety or sleep.     . APPLE CIDER VINEGAR PO Take 1 each by mouth daily. One gummy once a day    . aspirin EC 81 MG tablet Take 81 mg by mouth daily.    Marland Kitchen atorvastatin (LIPITOR) 20 MG tablet Take 20 mg by mouth daily at 6 PM.     . azelastine (ASTELIN) 0.1 % nasal spray     . b complex vitamins tablet Take 1 tablet by mouth daily.    P[place]as Patricia Microlet Lancets lancets 4 each by Other route 4 (four) times daily.    . cephALEXin (KEFLEX) 500 MG capsule Take 1 capsule (500 mg total) by mouth 3 (three) times daily. 30 capsule 2  . cetirizine (ZYRTEC) 10 MG tablet Take 10 mg by mouth at bedtime.    . clobetasol cream (TEMOVATE) [id number] % Apply 1 application topically 2 (two) times daily as needed.     . cyclobenzaprine (FLEXERIL) 10 MG tablet Take 10 mg by mouth See admin instructions. Take 10 mg by mouth at bedtime. Take 10 mg by mouth every 8 hours as needed for muscle spasms    . D3-50 50000 units capsule Take 50,000 Units by mouth every Saturday.  4  . dapagliflozin  propanediol (FARXIGA) 5 MG TABS tablet Take by mouth daily.    . diclofenac Sodium (VOLTAREN) 1 % GEL APPLY 2 GRAMS TOPICALLY 4 TIMES DAILY. RUB INTO AFFECTED AREA OF FOOT 2-4 TIMES DAILY. 100 g 0  . dicyclomine (BENTYL) 10 MG capsule Take 1 capsule (10 mg total) by mouth 4 (four) times daily as needed for spasms. 120 capsule 11  . doxycycline (PERIOSTAT) 20 MG tablet Take  20 mg by mouth 2 (two) times daily.   3  . DULoxetine (CYMBALTA) 60 MG capsule Take 60 mg by mouth daily.    . fluticasone (FLONASE) 50 MCG/ACT nasal spray Place 1 spray into both nostrils at bedtime.    . furosemide (LASIX) 40 MG tablet Take 40 mg by mouth daily. As needed for SOB or leg swelling    . gabapentin (NEURONTIN) 300 MG capsule TAKE 2 CAPSULES BY MOUTH THREE TIMES DAILY 540 capsule 0  . glucose blood (CONTOUR NEXT TEST) test strip 4 each by Other route 4 (four) times daily.    Marland Kitchen HYDROcodone-acetaminophen (NORCO) 7.5-325 MG tablet Take 1-2 tablets by mouth every 6 (six) hours as needed for moderate pain (for pain.). 40 tablet 0  . hyoscyamine (LEVSIN SL) 0.125 MG SL tablet Take 1-2 tablets under the tongue every 6-8 hours as needed. 180 tablet 2  . immune globulin, human, (GAMMAGARD S/D LESS IGA) 10 g injection Inject 40 g into the vein every 21 ( twenty-one) days. 40 mg daily for 3 days every 3 weeks    . Insulin Human (INSULIN PUMP) SOLN Inject into the skin.    Marland Kitchen insulin regular human CONCENTRATED (HUMULIN R) 500 UNIT/ML injection Inject 2 Units into the skin See admin instructions. 2.0 units /hour 0500-2359, 1.95units 2353-6144 with carb ratio 1:50 for meal time bolus    . ketoconazole (NIZORAL) 2 % cream Apply 1 application topically 2 (two) times daily as needed (for toes).     Marland Kitchen levothyroxine (SYNTHROID, LEVOTHROID) 137 MCG tablet Take 274 mcg by mouth daily before breakfast.    . lisinopril (ZESTRIL) 20 MG tablet Take 20 mg by mouth daily.    . metFORMIN (GLUCOPHAGE) 500 MG tablet Take by mouth 2 (two) times  daily with a meal.    . metoprolol succinate (TOPROL-XL) 50 MG 24 hr tablet Take 50 mg by mouth daily. Take with or immediately following a meal.    . metroNIDAZOLE (METROCREAM) 0.75 % cream Apply 1 application topically 2 (two) times daily.    . Multiple Vitamin (MULTIVITAMIN) tablet Take 1 tablet by mouth at bedtime.     . mupirocin ointment (BACTROBAN) 2 % Apply 1 application topically 2 (two) times daily. 30 g 2  . ondansetron (ZOFRAN-ODT) 8 MG disintegrating tablet Take 1 tablet (8 mg total) by mouth See admin instructions. Take 8 mg by mouth 3 times daily before meals. Take 8 mg by mouth every 6 hours as needed for nausea and vomiting. 45 tablet 4  . pantoprazole (PROTONIX) 40 MG tablet Take 1 tablet (40 mg total) by mouth 2 (two) times daily. 60 tablet 11  . PROCTOSOL HC 2.5 % rectal cream Apply 1 application topically 2 (two) times daily as needed for hemorrhoids.   2  . silver sulfADIAZINE (SILVADENE) 1 % cream Apply 1 application topically daily. 50 g 0  . Spacer/Aero-Holding Chambers (AEROCHAMBER MV) inhaler Use as instructed 1 each 0  . Turmeric (QC TUMERIC COMPLEX PO) Take 1 tablet by mouth daily.    . clindamycin (CLEOCIN) 150 MG capsule Take 150 mg by mouth 4 (four) times daily.    . INVOKAMET 50-1000 MG TABS Take 1 tablet by mouth 2 (two) times daily.   5  . mupirocin ointment (BACTROBAN) 2 % Apply 1 application topically 2 (two) times daily. 30 g 2   No current facility-administered medications for this visit.    Allergies:   Penicillins, Clarithromycin, Other, Adhesive [tape], Latex, and Moxifloxacin  Social History:  The patient  reports that she has never smoked. She has never used smokeless tobacco. She reports that she does not drink alcohol and does not use drugs.   Family History:  [name]   family history includes AAA (abdominal aortic aneurysm) in her mother; Cancer - Prostate in her father; Coronary artery disease in her father; Emphysema in her mother;  Hypertension in her brother, father, and mother; Renal Disease in her father.   ROS:  Please see the history of present illness.   All other systems are personally reviewed and negative.    Exam:    Vital Signs:  BP (!) 170/100   Pulse (!) 110   Ht 5\' 6"  (1.676 m)   Wt 288 lb (130.6 kg)   BMI 46.48 kg/m      well appearing obese caucasian female in no apparent distress Skin color good without labored breathing    Labs/Other Tests and Data Reviewed:    Recent Labs: No results found for requested labs within last 8760 hours.   Wt Readings from Last 3 Encounters:  12/29/20 288 lb (130.6 kg)  11/25/20 291 lb 6.4 oz (132.2 kg)  11/24/20 288 lb 3.2 oz (130.7 kg)     Other studies personally reviewed: Additional studies/ records that were reviewed today include: As above          ASSESSMENT & PLAN:    Orthostatic hypotension  Syncope  Diabetes  Morbid obesity  CIDP ( chronic inflammatory demyelinating polyneuropathy)  Hypertension   BP issues continue with significant day time variability, generally decreasing during the day, notwithstanding meds at night.  I wonder if this is gradual orthostatic hypotension, as she is sitting up a great deal more  She is working on getting compressive wear taht she can wear. So far efforts to find thigh sleeves has failed so she looking into pressure tights.  Suggested she use her sit-upon walker that might help her avoid syncope in the house.  And maybe glue an old shoe to the [place] pedal so she can exercise    COVID 19 screen The patient denies symptoms of COVID 19 at this time.  The importance of social distancing was discussed today.  Follow-up:  28m    Current medicines are reviewed at length with the patient today.   The patient does not have concerns regarding her medicines.  The following changes were made today:  none  Labs/ tests ordered today include:   No orders of the defined types were placed in this  encounter.   Future tests ( post COVID )     Patient Risk:  after full review of this patients clinical status, I feel that they are at moderate * risk at this time.  Today, I have spent 25 minutes with the patient with telehealth technology discussing the above.   Signed, Virl Axe, MD  12/29/2020 3:22 PM     [address] [facility] [place] [address] [phone] (office) [phone] (fax)

## 2020-12-29 NOTE — Telephone Encounter (Signed)
  Patient Consent for Virtual Visit         [name] has provided verbal consent on 12/29/2020 for a virtual visit (video or telephone).   CONSENT FOR VIRTUAL VISIT FOR:  [name]  By participating in this virtual visit I agree to the following:  I hereby voluntarily request, consent and authorize [facility] and its employed or contracted physicians, Engineer, materials, nurse practitioners or other licensed health care professionals (the Practitioner), to provide me with telemedicine health care services (the "Services") as deemed necessary by the treating Practitioner. I acknowledge and consent to receive the Services by the Practitioner via telemedicine. I understand that the telemedicine visit will involve communicating with the Practitioner through live audiovisual communication technology and the disclosure of certain medical information by electronic transmission. I acknowledge that I have been given the opportunity to request an in-person assessment or other available alternative prior to the telemedicine visit and am voluntarily participating in the telemedicine visit.  I understand that I have the right to withhold or withdraw my consent to the use of telemedicine in the course of my care at any time, without affecting my right to future care or treatment, and that the Practitioner or I may terminate the telemedicine visit at any time. I understand that I have the right to inspect all information obtained and/or recorded in the course of the telemedicine visit and may receive copies of available information for a reasonable fee.  I understand that some of the potential risks of receiving the Services via telemedicine include:  Marland Kitchen Delay or interruption in medical evaluation due to technological equipment failure or disruption; . Information transmitted may not be sufficient (e.g. poor resolution of images) to allow for appropriate medical decision making by the Practitioner;  and/or  . In rare instances, security protocols could fail, causing a breach of personal health information.  Furthermore, I acknowledge that it is my responsibility to provide information about my medical history, conditions and care that is complete and accurate to the best of my ability. I acknowledge that Practitioner's advice, recommendations, and/or decision may be based on factors not within their control, such as incomplete or inaccurate data provided by me or distortions of diagnostic images or specimens that may result from electronic transmissions. I understand that the practice of medicine is not an exact science and that Practitioner makes no warranties or guarantees regarding treatment outcomes. I acknowledge that a copy of this consent can be made available to me via my patient portal ([facility]), or I can request a printed copy by calling the office of [facility].    I understand that my insurance will be billed for this visit.   I have read or had this consent read to me. . I understand the contents of this consent, which adequately explains the benefits and risks of the Services being provided via telemedicine.  . I have been provided ample opportunity to ask questions regarding this consent and the Services and have had my questions answered to my satisfaction. . I give my informed consent for the services to be provided through the use of telemedicine in my medical care

## 2021-01-02 ENCOUNTER — Other Ambulatory Visit: Payer: Self-pay

## 2021-01-02 ENCOUNTER — Ambulatory Visit (INDEPENDENT_AMBULATORY_CARE_PROVIDER_SITE_OTHER): Payer: Medicare Other | Admitting: Podiatry

## 2021-01-02 DIAGNOSIS — L03032 Cellulitis of left toe: Secondary | ICD-10-CM | POA: Diagnosis not present

## 2021-01-02 DIAGNOSIS — L97521 Non-pressure chronic ulcer of other part of left foot limited to breakdown of skin: Secondary | ICD-10-CM | POA: Diagnosis not present

## 2021-01-04 NOTE — Progress Notes (Signed)
Subjective: 55 year old female presents the office today for follow-up evaluation of the wound, cellulitis of the left foot.  She states that she is doing much better.  The swelling is mostly resolved and with no redness.  She does not see much drainage denies any pus coming from the wound.  She currently denies any fevers, chills, nausea, vomiting.  She has no new concerns today otherwise.  Objective: AAO x3, NAD-presents with husband DP/PT pulses palpable bilaterally, CRT less than 3 seconds Sensation decreased with Semmes Weinstein monofilament Hyperkeratotic tissue present on the plantar aspect left hallux.  Upon debridement appears of the wound is healed this time it is preulcerative.  There is some dried blood present but there is no definitive open lesion identified.  There is trace edema to the hallux and substantially improved.  No erythema or warmth.  There is no fluctuation or crepitation there is no malodor. Dropfoot present left side No pain with calf compression, swelling, warmth, erythema  Assessment: Ulceration left foot; cellulitis  Plan: -All treatment options discussed with the patient including all alternatives, risks, complications.  -Sharply debrided the hyperkeratotic lesion today without any complications to the wound is healed however I would still continue with antibiotic ointment dressing changes daily offloading. -Monitor for any clinical signs or symptoms of infection and directed to call the office immediately should any occur or go to the ER.  No follow-ups on file.  Trula Slade DPM

## 2021-01-06 ENCOUNTER — Other Ambulatory Visit (HOSPITAL_COMMUNITY): Payer: Medicare Other

## 2021-01-10 ENCOUNTER — Ambulatory Visit: Payer: Medicare Other | Admitting: Podiatry

## 2021-01-20 ENCOUNTER — Ambulatory Visit: Payer: Medicare Other

## 2021-01-20 ENCOUNTER — Other Ambulatory Visit: Payer: Self-pay

## 2021-01-20 DIAGNOSIS — J986 Disorders of diaphragm: Secondary | ICD-10-CM

## 2021-01-20 DIAGNOSIS — G6181 Chronic inflammatory demyelinating polyneuritis: Secondary | ICD-10-CM

## 2021-01-20 DIAGNOSIS — G4733 Obstructive sleep apnea (adult) (pediatric): Secondary | ICD-10-CM | POA: Diagnosis not present

## 2021-01-24 ENCOUNTER — Other Ambulatory Visit: Payer: Self-pay

## 2021-01-24 ENCOUNTER — Ambulatory Visit: Payer: Medicare Other | Admitting: Podiatry

## 2021-01-24 DIAGNOSIS — E1149 Type 2 diabetes mellitus with other diabetic neurological complication: Secondary | ICD-10-CM | POA: Diagnosis not present

## 2021-01-24 DIAGNOSIS — L97521 Non-pressure chronic ulcer of other part of left foot limited to breakdown of skin: Secondary | ICD-10-CM

## 2021-01-24 DIAGNOSIS — E1140 Type 2 diabetes mellitus with diabetic neuropathy, unspecified: Secondary | ICD-10-CM | POA: Diagnosis not present

## 2021-01-25 ENCOUNTER — Telehealth: Payer: Self-pay | Admitting: Pulmonary Disease

## 2021-01-25 DIAGNOSIS — J986 Disorders of diaphragm: Secondary | ICD-10-CM

## 2021-01-25 DIAGNOSIS — G4733 Obstructive sleep apnea (adult) (pediatric): Secondary | ICD-10-CM

## 2021-01-25 DIAGNOSIS — R0683 Snoring: Secondary | ICD-10-CM

## 2021-01-25 NOTE — Telephone Encounter (Signed)
HST showed very mild  OSA with AHI 7/ hr  For this degree of OSA, no treatment would be okay.  But since she also has diaphragm weakness, she would qualify and benefit from CPAP. If she is agreeable can initiate auto CPAP 5 to 15 cm with mask of choice If she wants to discuss more can wait until office visit

## 2021-01-27 ENCOUNTER — Ambulatory Visit (HOSPITAL_COMMUNITY): Payer: Medicare Other | Attending: Internal Medicine

## 2021-01-27 ENCOUNTER — Other Ambulatory Visit: Payer: Self-pay

## 2021-01-27 DIAGNOSIS — I10 Essential (primary) hypertension: Secondary | ICD-10-CM | POA: Diagnosis present

## 2021-01-27 DIAGNOSIS — R55 Syncope and collapse: Secondary | ICD-10-CM | POA: Insufficient documentation

## 2021-01-27 LAB — ECHOCARDIOGRAM COMPLETE
Area-P 1/2: 3.91 cm2
S' Lateral: 3.4 cm

## 2021-01-27 NOTE — Telephone Encounter (Signed)
Called and went over HST results per Dr Elsworth Soho with patient. All questions answered and patient expressed full understanding of results and agreeable to Dr Bari Mantis recommendation for auto CPAP to be ordered. Orders placed per Dr Elsworth Soho. Confirmed upcoming office visit for 02/15/2021 at 9am to also discuss further. Nothing further needed at this time.

## 2021-01-28 NOTE — Progress Notes (Signed)
Subjective: 55 year old female presents the office today for follow-up evaluation of the wound, cellulitis of the left foot.  She has been keeping some amount of Vaseline on the wound but she is concerned that the toe will still crack and get very dry on the bottom.  Denies any drainage or pus or any swelling at this time.  No red streaks.  Denies any fevers, chills, nausea, vomiting.  No calf pain, chest pain, shortness of breath.  Objective: AAO x3, NAD-presents with husband DP/PT pulses palpable bilaterally, CRT less than 3 seconds Sensation decreased with Semmes Weinstein monofilament Hyperkeratotic tissue present on the plantar aspect left hallux.  It does appear that the skin gets dry and cracks open to the original wound appears to be healed.  Superficial granulation tissue present at the skin fissure but there is no probing, undermining or tunneling.  There is no surrounding erythema, ascending cellulitis.  There is no fluctuation crepitation.  No malodor Dropfoot present left side No pain with calf compression, swelling, warmth, erythema  Assessment: Ulceration left foot  Plan: -All treatment options discussed with the patient including all alternatives, risks, complications.  -Sharply debrided the hyperkeratotic lesion today without any complications.  Majority issues, for the dry skin were cracked open.  He recommended a small mass Silvadene which I applied today.  She is also purchased numerous moisturizers but she has not yet received them.  Once the wound is healed she can start using the moisturizing cream. -She was asking me if she felt that IVIG was helping her.  I do feel that since she started this medication she has made much progress from I first saw her. -Monitor for any clinical signs or symptoms of infection and directed to call the office immediately should any occur or go to the ER.  Trula Slade DPM

## 2021-02-01 ENCOUNTER — Other Ambulatory Visit: Payer: Self-pay | Admitting: Neurology

## 2021-02-01 ENCOUNTER — Other Ambulatory Visit: Payer: Self-pay | Admitting: Podiatry

## 2021-02-01 NOTE — Telephone Encounter (Signed)
Please advise 

## 2021-02-03 ENCOUNTER — Telehealth: Payer: Self-pay | Admitting: Gastroenterology

## 2021-02-03 NOTE — Telephone Encounter (Signed)
Patient called is upset said she has been waiting for a refill request for the Zofran and has not heard anything from anyone. Please advise

## 2021-02-06 ENCOUNTER — Ambulatory Visit: Payer: Medicare Other | Admitting: Neurology

## 2021-02-07 ENCOUNTER — Ambulatory Visit (INDEPENDENT_AMBULATORY_CARE_PROVIDER_SITE_OTHER): Payer: Medicare Other | Admitting: Podiatry

## 2021-02-07 ENCOUNTER — Ambulatory Visit (INDEPENDENT_AMBULATORY_CARE_PROVIDER_SITE_OTHER): Payer: Medicare Other

## 2021-02-07 ENCOUNTER — Other Ambulatory Visit: Payer: Self-pay | Admitting: General Surgery

## 2021-02-07 ENCOUNTER — Other Ambulatory Visit: Payer: Self-pay

## 2021-02-07 DIAGNOSIS — L97521 Non-pressure chronic ulcer of other part of left foot limited to breakdown of skin: Secondary | ICD-10-CM

## 2021-02-07 DIAGNOSIS — S99911A Unspecified injury of right ankle, initial encounter: Secondary | ICD-10-CM | POA: Diagnosis not present

## 2021-02-07 DIAGNOSIS — G6181 Chronic inflammatory demyelinating polyneuritis: Secondary | ICD-10-CM | POA: Diagnosis not present

## 2021-02-07 DIAGNOSIS — S9031XA Contusion of right foot, initial encounter: Secondary | ICD-10-CM

## 2021-02-07 DIAGNOSIS — E1140 Type 2 diabetes mellitus with diabetic neuropathy, unspecified: Secondary | ICD-10-CM | POA: Diagnosis not present

## 2021-02-07 DIAGNOSIS — E1149 Type 2 diabetes mellitus with other diabetic neurological complication: Secondary | ICD-10-CM

## 2021-02-07 DIAGNOSIS — M21372 Foot drop, left foot: Secondary | ICD-10-CM

## 2021-02-07 DIAGNOSIS — S90121A Contusion of right lesser toe(s) without damage to nail, initial encounter: Secondary | ICD-10-CM

## 2021-02-07 DIAGNOSIS — S91209A Unspecified open wound of unspecified toe(s) with damage to nail, initial encounter: Secondary | ICD-10-CM

## 2021-02-07 MED ORDER — ONDANSETRON HCL 8 MG PO TABS: 8.0000 mg | ORAL_TABLET | Freq: Three times a day (TID) | ORAL | 4 refills | Status: DC | PRN

## 2021-02-07 MED ORDER — DOXYCYCLINE HYCLATE 100 MG PO TABS: 100.0000 mg | ORAL_TABLET | Freq: Two times a day (BID) | ORAL | 0 refills | Status: DC

## 2021-02-07 NOTE — Telephone Encounter (Signed)
Notified the patient that refills for Zofran were sent in today

## 2021-02-10 NOTE — Progress Notes (Signed)
Subjective: 55 year old female presents the office today for concerns of an injury to the right foot after she dropped a cast sliding scale on the foot.  She also states that the left second toenail did come off as she accidentally hit it.  Also presents today to have her left big toe checked from the wound standpoint.  From the wound standpoint denies any redness or drainage or any swelling.  She has bruising to the right foot from the injury.  Left second toenail base has a wound but denies any drainage or swelling or redness. Denies any systemic complaints such as fevers, chills, nausea, vomiting. No acute changes since last appointment, and no other complaints at this time.   Objective: AAO x3, NAD DP/PT pulses palpable bilaterally, CRT less than 3 seconds On the right side there is bruising present on the first MPJ extending to the second MPJ.  There is no open lesions on the right side.  The left side second toenails no longer present and granular wound base is evident.  There is no drainage or pus or ascending cellulitis.  No edema, erythema.  Hyperkeratotic tissue plantar aspect of the hallux with almost a skin fissure type lesion which is still about the same compared to last appointment.  There is no probing, undermining or tunneling.  No surrounding erythema, ascending cellulitis.  No edema to the toe.  No fluctuation crepitation.  No pain with calf compression, swelling, warmth, erythema  Assessment: Right foot contusion; left second digit nail avulsion; chronic ulceration left plantar hallux  Plan: -All treatment options discussed with the patient including all alternatives, risks, complications.  -X-rays obtained and reviewed in the right foot.  No obvious signs of acute fracture. -For the right foot I do want her immobilized in surgical shoe which she has at home. -Sharply debrided the wound in the left hallux utilizing the 312 blade scalpel debride nonviable devitalized tissue to help  promote wound healing.  Continue antibiotic ointment dressing changes daily.  Once the wound is healed she can use moisturizer.  Monitor closely for any signs or symptoms of infection. -Doxycyline  -For the left second digit toenail recommend antibiotic ointment dressing changes daily. -Patient encouraged to call the office with any questions, injury to the right foot.  She states that she dropped, change in symptoms.   Trula Slade DPM

## 2021-02-14 ENCOUNTER — Other Ambulatory Visit: Payer: Self-pay

## 2021-02-14 ENCOUNTER — Telehealth: Payer: Self-pay

## 2021-02-14 ENCOUNTER — Ambulatory Visit: Payer: Medicare Other | Admitting: Podiatry

## 2021-02-14 DIAGNOSIS — L97521 Non-pressure chronic ulcer of other part of left foot limited to breakdown of skin: Secondary | ICD-10-CM | POA: Diagnosis not present

## 2021-02-14 DIAGNOSIS — S90221A Contusion of right lesser toe(s) with damage to nail, initial encounter: Secondary | ICD-10-CM

## 2021-02-14 DIAGNOSIS — S9031XD Contusion of right foot, subsequent encounter: Secondary | ICD-10-CM

## 2021-02-14 DIAGNOSIS — S90121D Contusion of right lesser toe(s) without damage to nail, subsequent encounter: Secondary | ICD-10-CM

## 2021-02-14 NOTE — Progress Notes (Signed)
Subjective: 55 year old female presents the office today for follow-up evaluation of injury to the right foot as well as for a wound on the left big toe.  States that she has been doing well and the bruising much improved on the right foot but small blister is reformed on the big toe nail.  She denies any drainage or pus.  Denies any red streaking.  She still been using cream on the bottom of the left hallux and the wound continues.  Denies any redness or drainage or any swelling.  She is almost completed a course of doxycycline has 2 more days. Denies any systemic complaints such as fevers, chills, nausea, vomiting. No acute changes since last appointment, and no other complaints at this time.   Objective: AAO x3, NAD DP/PT pulses palpable bilaterally, CRT less than 3 seconds On the right hallux toenail there is hemorrhagic blister and upon draining this only bloody fluid is expressed there is no purulence.  There is no surrounding erythema, ascending cellulitis but no fluctuation crepitation otherwise.  No malodor.  On the plantar aspect the left hallux there is a hyperkeratotic lesion with a skin fissure measuring 1.5 x 0.3 x 0.1 cm without any probing to bone, minor tunneling.  No surrounding erythema, ascending cellulitis.  No fluctuation crepitation but there is no malodor.  No pain with calf compression, swelling, warmth, erythema  Assessment: Foot contusion, onycholysis; left hallux ulceration  Plan: -All treatment options discussed with the patient including all alternatives, risks, complications.  -Regards to the right foot I cleaned the area with alcohol and I did drain the blister today although there is minimal fluid.  Continue Betadine to the area daily.  Monitoring signs or symptoms of infection.  I hope this area will dry, callus over.  If still present next appointment likely have to debride and remove this tissue. -For the left hallux I sharply debrided the wound today utilizing the  312 with scalpel debride nonviable devitalized tissue to promote wound healing.  Continue Silvadene cream for now. -Patient encouraged to call the office with any questions, concerns, change in symptoms.   Trula Slade DPM

## 2021-02-14 NOTE — Telephone Encounter (Signed)
Order for wound measuring 1.5 cm x 0.2 cm x 0.1 cm for Hydrofera Blue daily faxed with demographics and office note faxed to [facility]

## 2021-02-15 ENCOUNTER — Encounter: Payer: Self-pay | Admitting: Pulmonary Disease

## 2021-02-15 ENCOUNTER — Ambulatory Visit: Payer: Medicare Other | Admitting: Pulmonary Disease

## 2021-02-15 DIAGNOSIS — G4733 Obstructive sleep apnea (adult) (pediatric): Secondary | ICD-10-CM | POA: Diagnosis not present

## 2021-02-15 DIAGNOSIS — J986 Disorders of diaphragm: Secondary | ICD-10-CM

## 2021-02-15 DIAGNOSIS — J4530 Mild persistent asthma, uncomplicated: Secondary | ICD-10-CM | POA: Diagnosis not present

## 2021-02-15 MED ORDER — BREO ELLIPTA 200-25 MCG/INH IN AEPB: 1.0000 | INHALATION_SPRAY | Freq: Every day | RESPIRATORY_TRACT | 0 refills | Status: DC

## 2021-02-15 NOTE — Assessment & Plan Note (Signed)
We discussed that she has right diaphragmatic weakness and CPAP would be the best therapy for this. She is willing to trial CPAP Prescription for auto CPAP will be sent to DME

## 2021-02-15 NOTE — Patient Instructions (Signed)
  Trial of Breo 100 once daily- rinse your mouth  Let me know if this works & we can send Rx  We will confirm CPAP Rx sent to [facility]

## 2021-02-15 NOTE — Assessment & Plan Note (Signed)
She had significant bronchodilator response although FEV1 ratio was not suggestive of airway obstruction.  As such I will place her on Breo and see if she has any improvement in her shortness of breath.  She does not provide a longstanding history of asthma

## 2021-02-15 NOTE — Assessment & Plan Note (Signed)
HST showed mild OSA given weight gain and diaphragmatic dysfunction I think she will benefit from CPAP therapy. Prescription for auto CPAP will be sent to DME

## 2021-02-15 NOTE — Progress Notes (Signed)
   Subjective:    Patient ID: [name], female    DOB: [birth year], 55 y.o.   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    PMH -CIDP as above, dysautonomia Caryl Comes) , orthostatic hypotension with syncope and multiple falls, proteinuria/CKD Hollie Salk ) , hypertension, thyroid cancer status post thyroidectomy , fibromyalgia , IDDM on insulin pump    Chief Complaint  Patient presents with  . Follow-up    Pt stated that she is tired all the time.  She gets [facility] easily.      She was initially referred because visiting nurse heard decreased breath sounds on the right.  Found to have right diaphragmatic weakness, mild OSA on HST and PFT showing reversible airway obstruction.  We reviewed all these tests today. She had a skillet fall on her foot and was badly bruised.  She shows me pictures  She continues to complain of increased shortness of breath on walking, oxygen saturations have been running 89 to 91% on exertion and 96% at rest, heart rate in the 120s.  She shows me several pictures of her pulse oximeter. She is maintained on metoprolol 50 Prinivil has now been decreased to 20 because she was orthostatic on higher doses she continues to follow-up with cardiology for dysautonomia  She is unable to lie down flat and sleeps on a wedge Accompanied by her husband [name] who independently corroborates history  Significant tests/ events reviewed  HST 01/2021 mild OSA, AHI 7/h 11/2020 Sniff test decreased excursion of the right hemidiaphragm  PFT 12/2020 moderate reversible obstruction, ratio 85, FEV1 57%, FVC 62%, significant bronchodilator response improved by 0.54 to 81%, TLC 80%, DLCO 108%  Chest x-ray 11/18/2020  independently reviewed shows low volumes elevated right hemidiaphragm. Chest x-ray 02/2015 also shows lower volumes and similar mild elevation of right hemidiaphragm.  Review of Systems neg for any significant sore throat, dysphagia, itching, sneezing, nasal congestion or excess/ purulent secretions, fever, chills, sweats, unintended wt loss, pleuritic or exertional cp, hempoptysis, orthopnea pnd or change in chronic leg swelling. Also denies presyncope, palpitations, heartburn, abdominal pain, nausea, vomiting, diarrhea or change in bowel or urinary habits, dysuria,hematuria, rash, arthralgias, visual complaints, headache, numbness weakness or ataxia.     Objective:   Physical Exam  Gen. Pleasant, obese, in no distress ENT - no lesions, no post nasal drip Neck: No JVD, no thyromegaly, no carotid bruits Lungs: no use of accessory muscles, no dullness to percussion, decreased RT without rales or rhonchi  Cardiovascular: Rhythm regular, heart sounds  normal, no murmurs or gallops, no peripheral edema Musculoskeletal: No deformities, no cyanosis or clubbing , no tremors       Assessment & Plan:

## 2021-02-21 ENCOUNTER — Encounter: Payer: Self-pay | Admitting: Podiatry

## 2021-02-21 ENCOUNTER — Ambulatory Visit: Payer: Medicare Other | Admitting: Podiatry

## 2021-02-21 ENCOUNTER — Other Ambulatory Visit: Payer: Self-pay

## 2021-02-21 DIAGNOSIS — S90221A Contusion of right lesser toe(s) with damage to nail, initial encounter: Secondary | ICD-10-CM | POA: Diagnosis not present

## 2021-02-21 DIAGNOSIS — L97521 Non-pressure chronic ulcer of other part of left foot limited to breakdown of skin: Secondary | ICD-10-CM

## 2021-02-21 DIAGNOSIS — S90121D Contusion of right lesser toe(s) without damage to nail, subsequent encounter: Secondary | ICD-10-CM | POA: Diagnosis not present

## 2021-02-21 DIAGNOSIS — S9031XD Contusion of right foot, subsequent encounter: Secondary | ICD-10-CM | POA: Diagnosis not present

## 2021-02-27 NOTE — Progress Notes (Signed)
Subjective: 55 year old female presents the office today for follow-up evaluation of injury to the right foot as well as for a wound on the left big toe.  She says the bruising on the right foot is resolved.  The area of the blister is doing much better than the right big toe.  She presents today also discussed and evaluated wound on the plantar aspect of the left foot.  She brings in the [facility] dressing today.  Denies any drainage or pus coming from the wound.  No increase in swelling or redness.  She has no pain although she does have neuropathy. Denies any systemic complaints such as fevers, chills, nausea, vomiting. No acute changes since last appointment, and no other complaints at this time.   Objective: AAO x3, NAD DP/PT pulses palpable bilaterally, CRT less than 3 seconds RIGHT: On the right hallux toenail there is an old hemorrhagic blister which is dry.  There is no fluid identified today.  There is no surrounding erythema, ascending cellulitis.  There is no fluctuation crepitation but there is no malodor.  The area of abrasion on the right foot has resolved as well there is no pain.  LEFT: Hyperkeratotic tissue present plantar aspect of hallux.  Upon debridement superficial granular wound is evident although appears the wound is almost healed.  There is no surrounding erythema, ascending cellulitis there is no fluctuation crepitation but there is no malodor. No pain with calf compression, swelling, warmth, erythema bilaterally  Assessment: Foot contusion, onycholysis; left hallux ulceration  Plan: -All treatment options discussed with the patient including all alternatives, risks, complications.  -RIGHT foot: Continue to keep the right hallux clean and dry.  Hopefully the old blister will scab over completely and fall off on its own.  Monitor for signs or symptoms of infection -LEFT: Sharply debrided the hyperkeratotic tissue to reveal superficial underlying ulceration although  doing much better.  I debrided the wound today utilizing the 312 with scalpel down to healthy, bleeding, granular tissue.  This is to promote wound healing.  I cleaned the wound.  Hydrofera Blue dressing was applied followed by dry sterile dressing.  Continue with daily dressing changes. -Patient encouraged to call the office with any questions, concerns, change in symptoms.   Trula Slade DPM

## 2021-02-28 ENCOUNTER — Other Ambulatory Visit: Payer: Self-pay | Admitting: Internal Medicine

## 2021-02-28 ENCOUNTER — Telehealth: Payer: Self-pay | Admitting: Pulmonary Disease

## 2021-02-28 MED ORDER — BREO ELLIPTA 100-25 MCG/INH IN AEPB: 1.0000 | INHALATION_SPRAY | Freq: Every day | RESPIRATORY_TRACT | 1 refills | Status: DC

## 2021-02-28 NOTE — Telephone Encounter (Signed)
Spoke with the pt  She states that the sample of Breo worked well  We called in rx for the 100 mcg, but the sample she had was the 200 mcg  In the AVS it stated for her to have the 100 though  Do you want her to remain on the 200 since she has done well with that?

## 2021-02-28 NOTE — Telephone Encounter (Signed)
Please keep Breo 100 - it will have the same effect , lower steroid dose

## 2021-02-28 NOTE — Telephone Encounter (Signed)
Called and spoke with Patient. Patient requested Breo prescription to be sent to [facility].Patient request 90 days supply. Breo prescription sent as requested to requested pharmacy. Nothing further at this time.  Per Dr. Elsworth Soho- 02/15/21 Trial of Breo 100 once daily- rinse your mouth  Let me know if this works & we can send Rx

## 2021-03-01 NOTE — Telephone Encounter (Signed)
Called and spoke with the pt and notified of response per Dr Elsworth Soho  She verbalized understanding  Nothing further needed

## 2021-03-07 ENCOUNTER — Other Ambulatory Visit: Payer: Self-pay

## 2021-03-07 ENCOUNTER — Encounter: Payer: Self-pay | Admitting: Podiatry

## 2021-03-07 ENCOUNTER — Ambulatory Visit: Payer: Medicare Other | Admitting: Podiatry

## 2021-03-07 DIAGNOSIS — L97521 Non-pressure chronic ulcer of other part of left foot limited to breakdown of skin: Secondary | ICD-10-CM

## 2021-03-07 DIAGNOSIS — K219 Gastro-esophageal reflux disease without esophagitis: Secondary | ICD-10-CM | POA: Insufficient documentation

## 2021-03-07 DIAGNOSIS — W19XXXA Unspecified fall, initial encounter: Secondary | ICD-10-CM | POA: Insufficient documentation

## 2021-03-07 DIAGNOSIS — S90221D Contusion of right lesser toe(s) with damage to nail, subsequent encounter: Secondary | ICD-10-CM

## 2021-03-07 DIAGNOSIS — G43909 Migraine, unspecified, not intractable, without status migrainosus: Secondary | ICD-10-CM | POA: Insufficient documentation

## 2021-03-07 DIAGNOSIS — R2689 Other abnormalities of gait and mobility: Secondary | ICD-10-CM | POA: Insufficient documentation

## 2021-03-09 NOTE — Progress Notes (Signed)
Subjective: 55 year old female presents the office today for follow-up evaluation of a wound on the left foot.  She has been using [facility] and has been doing better.  Denies any drainage or pus or any swelling or redness. Denies any systemic complaints such as fevers, chills, nausea, vomiting. No acute changes since last appointment, and no other complaints at this time.   Objective: AAO x3, NAD DP/PT pulses palpable bilaterally, CRT less than 3 seconds RIGHT: On the right hallux toenail there is an old hemorrhagic blister which is dry and appears to have scabbed over.  There is no edema, erythema, drainage or pus or any signs of infection.  No open lesions.  No significant bruising.  No swelling.  LEFT: Hyperkeratotic tissue present plantar aspect of hallux.  Upon debridement there is a very small superficial granular wound present without any probing, undermining or tunneling.  No signs of infection there is no edema, erythema.  Patient mostly healed doing much better. No pain with calf compression, swelling, warmth, erythema bilaterally  Assessment: Right hallux onycholysis; left hallux ulceration  Plan: -All treatment options discussed with the patient including all alternatives, risks, complications.  -RIGHT foot: Continue to keep the right hallux clean and dry.  The area will continue to heal hopefully, upon its own.  No signs of infection but continue to monitor closely. -LEFT: Sharply debrided the hyperkeratotic tissue to reveal superficial underlying ulceration although doing much better.  I debrided the wound today utilizing the 312 with scalpel down to healthy, bleeding, granular tissue.  This is to promote wound healing.  I cleaned the wound.  Hydrofera Blue dressing was applied followed by dry sterile dressing.  Continue with daily dressing changes. -Patient encouraged to call the office with any questions, concerns, change in symptoms.   Trula Slade DPM

## 2021-03-10 ENCOUNTER — Telehealth: Payer: Self-pay | Admitting: Podiatry

## 2021-03-10 NOTE — Telephone Encounter (Signed)
Prior Authorization has been requested for the following patient. The form will be faxed but the rep from [facility] has requested follow up call and her contact info is listed below, Please [name], [place] [employer]  [phone] Option 5

## 2021-03-13 ENCOUNTER — Ambulatory Visit: Payer: Medicare Other | Admitting: Neurology

## 2021-03-13 ENCOUNTER — Encounter: Payer: Self-pay | Admitting: Neurology

## 2021-03-13 ENCOUNTER — Other Ambulatory Visit: Payer: Self-pay

## 2021-03-13 VITALS — BP 100/78 | HR 99 | Ht 66.0 in | Wt 294.0 lb

## 2021-03-13 DIAGNOSIS — G6181 Chronic inflammatory demyelinating polyneuritis: Secondary | ICD-10-CM

## 2021-03-13 NOTE — Progress Notes (Signed)
Follow-up Visit   Date: 03/13/21    [name] MRN: [id number] DOB: 03-21-1966   Interim History: [name] is a 55 y.o. right-handed Caucasian female with insulin-dependent diabetes mellitus, thyroid cancer s/p resection secondary hypothyroidism (2016), hypertension, hyperlipidemia, GERD, s/p left L5 hemilaminectomy and discectomy, dysautonomia, chronic left ankle pain returning for a follow-up of chronic inflammatory demyelinating polyradiculoneuropathy.  History of present illness: She was diagnosed with CIDP in the fall of 2018 after left sural nerve biopsy showed demyelinating and axon loss findings.  Several years preceding this, she was having painful paresthesias and diffuse weakness of the feet which extended in the legs and arms.  She also developed dysautonomic symptoms of bladder incontinence, orthostatic hypotension, and syncope. She was frequently falling and started using a rollator.  She was started on IVIG in February 2019 and has been experiencing problems with IV venous access therefore had port-a-cath placed in October 2019.  By the fall of 2019, she was appreciating improved proximal arm strength and ability to grasp objects.  Tingling has improved, however the numbness remains unchanged.  She uses a power chair as needed for long distances, otherwise uses a walker at home.     UPDATE 08/03/2020:  She is here for follow-up. Overall, she feels that her neuropathy has been stable. Her hands continue to be weak and she reports dropping things frequently, but this is no worse than before.  She has had about 4 falls in the past 6 months due to imbalance and ankle instability.  She is getting fitted for a new AFO because her prior one caused skin breakdown.   She has orthopeadics issues related to her left wrist and may need surgery.   She complains of word-finding difficulty and remembering things.   UPDATE 03/13/2021:  She is here for follow-up visit.  She is very  anxious because her IVIG is no longer covered by [facility] and she is trying to away her coverage with [facility] home infusion.  Her last infusion was 4 weeks ago.  She feels that her arms and legs are getting weaker.  She continues to drop objects, most recently she dropped a cast iron pan on her right foot.  She sustained superficial injuries, fortunately, no fractures.   Medications:  Current Outpatient Medications on File Prior to Visit  Medication Sig Dispense Refill  . acetaminophen (TYLENOL) 500 MG tablet Take 1,000 mg by mouth every 6 (six) hours as needed for moderate pain or headache.     . albuterol (VENTOLIN HFA) 108 (90 Base) MCG/ACT inhaler Inhale 2 puffs into the lungs every 6 (six) hours as needed for wheezing or shortness of breath. 8 g 6  . ALPRAZolam (XANAX) 0.5 MG tablet Take 0.5 mg by mouth every 6 (six) hours as needed for anxiety or sleep.     . APPLE CIDER VINEGAR PO Take 1 each by mouth daily. One gummy once a day    . aspirin EC 81 MG tablet Take 81 mg by mouth daily.    Marland Kitchen atorvastatin (LIPITOR) 20 MG tablet Take 20 mg by mouth daily at 6 PM.     . azelastine (ASTELIN) 0.1 % nasal spray     . b complex vitamins tablet Take 1 tablet by mouth daily.    Pleas Patricia Microlet Lancets lancets 4 each by Other route 4 (four) times daily.    . benzonatate (TESSALON) 100 MG capsule Take 100 mg by mouth 3 (three) times daily as needed.    Marland Kitchen  cetirizine (ZYRTEC) 10 MG tablet Take 10 mg by mouth at bedtime.    . clobetasol cream (TEMOVATE) 9.47 % Apply 1 application topically 2 (two) times daily as needed.     . cyclobenzaprine (FLEXERIL) 10 MG tablet Take 10 mg by mouth See admin instructions. Take 10 mg by mouth at bedtime. Take 10 mg by mouth every 8 hours as needed for muscle spasms    . D3-50 50000 units capsule Take 50,000 Units by mouth every Saturday.  4  . dapagliflozin propanediol (FARXIGA) 5 MG TABS tablet Take by mouth daily.    . diclofenac Sodium (VOLTAREN) 1 % GEL APPLY 2 GRAMS  TO AFFECTED AREA(S) OF FOOT 2-4 TIMES DAILY. 100 g 0  . dicyclomine (BENTYL) 10 MG capsule Take 1 capsule (10 mg total) by mouth 4 (four) times daily as needed for spasms. 120 capsule 11  . doxycycline (PERIOSTAT) 20 MG tablet Take 20 mg by mouth 2 (two) times daily.   3  . doxycycline (VIBRA-TABS) 100 MG tablet Take 1 tablet (100 mg total) by mouth 2 (two) times daily. 20 tablet 0  . DULoxetine (CYMBALTA) 60 MG capsule Take 60 mg by mouth daily.    . fluticasone (FLONASE) 50 MCG/ACT nasal spray Place 1 spray into both nostrils at bedtime.    . fluticasone furoate-vilanterol (BREO ELLIPTA) 100-25 MCG/INH AEPB Inhale 1 puff into the lungs daily. 120 each 1  . fluticasone furoate-vilanterol (BREO ELLIPTA) 200-25 MCG/INH AEPB Inhale 1 puff into the lungs daily. 1 each 0  . furosemide (LASIX) 40 MG tablet Take 40 mg by mouth daily. As needed for SOB or leg swelling    . gabapentin (NEURONTIN) 300 MG capsule TAKE 2 CAPSULES BY MOUTH THREE TIMES DAILY 540 capsule 0  . GAMUNEX-C 20 GM/200ML SOLN     . glucose blood (CONTOUR NEXT TEST) test strip 4 each by Other route 4 (four) times daily.    Marland Kitchen HYDROcodone-acetaminophen (NORCO) 7.5-325 MG tablet Take 1-2 tablets by mouth every 6 (six) hours as needed for moderate pain (for pain.). 40 tablet 0  . HYDROMET 5-1.5 MG/5ML syrup Take 5 mLs by mouth every 6 (six) hours as needed.    . hyoscyamine (LEVSIN SL) 0.125 MG SL tablet Take 1-2 tablets under the tongue every 6-8 hours as needed. 180 tablet 2  . immune globulin, human, (GAMMAGARD S/D LESS IGA) 10 g injection Inject 40 g into the vein every 21 ( twenty-one) days. 40 mg daily for 3 days every 3 weeks    . Insulin Human (INSULIN PUMP) SOLN Inject into the skin.    Marland Kitchen insulin regular human CONCENTRATED (HUMULIN R) 500 UNIT/ML injection Inject 2 Units into the skin See admin instructions. 2.0 units /hour 0500-2359, 1.95units 0962-8366 with carb ratio 1:50 for meal time bolus    . ketoconazole (NIZORAL) 2 % cream  Apply 1 application topically 2 (two) times daily as needed (for toes).     Marland Kitchen levothyroxine (SYNTHROID, LEVOTHROID) 137 MCG tablet Take 274 mcg by mouth daily before breakfast.    . lisinopril (ZESTRIL) 10 MG tablet Take 30 mg by mouth daily.    Marland Kitchen lisinopril (ZESTRIL) 20 MG tablet Take 20 mg by mouth daily.    . metFORMIN (GLUCOPHAGE) 500 MG tablet Take by mouth 2 (two) times daily with a meal.    . metoprolol succinate (TOPROL-XL) 50 MG 24 hr tablet TAKE 1 TABLET BY MOUTH IN THE MORNING AND 1/2 TABLET IN THE EVENING 135 tablet 0  .  metroNIDAZOLE (FLAGYL) 500 MG tablet Take 500 mg by mouth 3 (three) times daily.    . metroNIDAZOLE (METROCREAM) 0.75 % cream Apply 1 application topically 2 (two) times daily.    . Multiple Vitamin (MULTIVITAMIN) tablet Take 1 tablet by mouth at bedtime.     . ondansetron (ZOFRAN-ODT) 8 MG disintegrating tablet Take 8 mg by mouth every 6 (six) hours as needed.    . pantoprazole (PROTONIX) 40 MG tablet Take 1 tablet (40 mg total) by mouth 2 (two) times daily. 60 tablet 11  . PROCTOSOL HC 2.5 % rectal cream Apply 1 application topically 2 (two) times daily as needed for hemorrhoids.   2  . Promethazine-Codeine 6.25-10 MG/5ML SOLN Take 5 mLs by mouth 2 (two) times daily as needed.    . silver sulfADIAZINE (SILVADENE) 1 % cream Apply 1 application topically daily. 50 g 0  . Spacer/Aero-Holding Chambers (AEROCHAMBER MV) inhaler Use as instructed 1 each 0  . Turmeric (QC TUMERIC COMPLEX PO) Take 1 tablet by mouth daily.     No current facility-administered medications on file prior to visit.    Allergies:  Allergies  Allergen Reactions  . Penicillins Anaphylaxis, Shortness Of Breath, Swelling, Palpitations and Other (See Comments)    Has patient had a PCN reaction causing immediate rash, facial/tongue/throat swelling, SOB or lightheadedness with hypotension: Yes Has patient had a PCN reaction causing severe rash involving mucus membranes or skin necrosis: No Has  patient had a PCN reaction that required hospitalization No Has patient had a PCN reaction occurring within the last 10 years: No If all of the above answers are "NO", then may proceed with Cephalosporin use.   . Clarithromycin Nausea And Vomiting and Other (See Comments)    Any antibiotic (doxyclycine, etc)  . Other Other (See Comments)    All antibiotics makes patient sick  . Adhesive [Tape] Itching and Other (See Comments)    Redness  . Latex Itching, Rash and Other (See Comments)    Blisters with prolonged contact  . Moxifloxacin Nausea And Vomiting and Other (See Comments)    Needs Zofran or Phenergan    Vital Signs:  BP 100/78 (BP Location: Right Arm, Patient Position: Standing)   Pulse 99   Ht '5\' 6"'  (1.676 m)   Wt 294 lb (133.4 kg)   SpO2 94%   BMI 47.45 kg/m   Neurological Exam: MENTAL STATUS including orientation to time, place, person, recent and remote memory, attention span and concentration, language, and fund of knowledge is normal.  Speech is not dysarthric.  CRANIAL NERVES:  Pupils are round and reactive to light.  Face is symmetric.  Extra ocular muscles intact  MOTOR: Tone is normal.  Improved muscle bulk in the intrinsic hand muscles.  No atrophy.   Right Upper Extremity:    Left Upper Extremity:    Deltoid  5/5   Deltoid  5/5   Biceps  5/5   Biceps  5/5   Triceps  5/5   Triceps  5/5   Wrist extensors  5/5   Wrist extensors  5/5   Wrist flexors 5/5  Wrist flexors  5/5  Finger extensors  5/5   Finger extensors  5/5   Finger flexors  5/5   Finger flexors  5/5  Dorsal interossei  5/5   Dorsal interossei  5/5   Abductor pollicis  5-/5   Abductor pollicis  5-/5  Tone (Ashworth scale)  0  Tone (Ashworth scale)  0   Right Lower  Extremity:    Left Lower Extremity:    Hip flexors  5/5   Hip flexors  5-/5   Hip extensors  5/5   Hip extensors  5/5   Knee flexors  5/5   Knee flexors  5-/5    Dorsiflexors  5/5   Dorsiflexors  4/5    Plantarflexors  5/5   Plantarflexors  4/5   Toe extensors  4-/5   Toe extensors  1/5   Toe flexors  4-/5   Toe flexors  1/5   Tone (Ashworth scale)  0  Tone (Ashworth scale)  0    MSRs:    Right                                                                 Left brachioradialis 2+  brachioradialis 2+  biceps 2+  biceps 2+  triceps 2+  triceps 2+  patellar 3+  patellar 3+  ankle jerk 0  ankle jerk 0   SENSORY:  Pin prick reduced over the hands and mild-calf distally.   COORDINATION/GAIT:  Gait assisted with a walker, stable.   Data: NCS/EMG of the arms 01/29/2017: The electrophysiologic testing is most consistent with an active on chronic, distal and symmetric sensorimotor polyneuropathy, demyelinating and axon loss in type. Overall, these findings are severe in degree electrically.   In addition, the presence of temporal dispersion suggests an acquired condition.  NCS/EMG of the legs 01/10/2017:    The electrophysiologic findings are most consistent with a subacute sensorimotor polyneuropathy, axon loss in type, affecting the lower extremities and worse on the left. Overall, these findings are severe in degree electrically.  Labs 01/25/2017:  ESR 10, CRP 0.3, vitamin B12 256, ACE 5, MMA 231, ANA neg, SSA/B neg, copper 92, vitamin B1 6*, SPEP with IFE no M protein  CSF 02/19/2017:  R1200, W0 G76 P37  ACE 10, IgG index 0.47, Lyme neg, cytology neg, no OCB  MRI lumbar spine wwo contrast 02/28/2017: 1. Stable appearance of the lumbar spine. No acute abnormality identified. 2. Postoperative changes on the left L5-S1 without residual stenosis. 3. Small left subarticular disc protrusion at L3-4 encroaching upon the left lateral recess, and potentially affecting the descending left L4 nerve root. 4. Mild bilateral foraminal stenosis at L4-5 related to disc bulge and facet degeneration.  MRI cervical spine wwo contrast 09/09/2017: 1. Normal MRI appearance of the cervical spinal  cord. No cord signal abnormality is identified. 2. Multilobulated disc protrusion at C4-5, flattening the right hemi cord without cord signal changes. 3. Broad posterior disc bulge at C6-7 with resultant mild canal and left C7 foraminal stenosis. 4. Central disc protrusion at C3-4 with resultant mild spinal stenosis. No significant cord deformity.  MRI brain wwo contrast 09/09/2017:  Normal MRI of the brain. No acute intracranial abnormality identified.  Left sural nerve biopsy performed on 10/22 shows chronic (predominant) and acute loss of large myelinated fibers with possible demyelination/remyelination process consistent with prior diagnosis of chronic inflammatory demyelinating poly-radicular neuropathy; marked thickening of the endoneutrial vessels consistent with diabetic neuropathy, clinical correlation recommended.   Left vastus lateralis muscle biopsy: 2 isolated foci of very mild perivascular, possibly vascular, inflammation, and mild nonspecific mild myofiber atrophy and skeletal muscle example without myofiber necrosis.  IMPRESSION/PLAN: Chronic inflammatory polyradiculoneuropathy with distal weakness, sensory loss (worse on the left), and dysautonomia.  Diagnosed by sural nerve biopsy (09/2017); CSF testing was normal, however NCS/EMG was highly suggestive of a subacute polyneuropathy.  IVIG started in February 2019 and on IVIG 1g/kg every 3 weeks via port-a-cath.  At her last visit, I discussed tapering her IVIG because her exam has been stable, however, she has continued to get infusion every 21 days until this month.  She tells me that IVIG is no longer covered under [facility] and they have transferred the order to [facility], however, she is unsure if she will qualify for financial aide.   At this juncture, I suggest holding IVIG and monitoring her.  Her exam is remarkably better and has stabilized.  I ressured patient that her neuropathy is most likely in remission. If she develops  worsening neuropathy, the next step is to start cellcept For her painful paresthesias, continue gabapentin 659m TID.    Return to clinic 4 months  Total time spent reviewing records, interview, history/exam, documentation, and coordination of care on day of encounter:  40 min   Thank you for allowing me to participate in patient's care.  If I can answer any additional questions, I would be pleased to do so.    Sincerely,    Lanai Conlee K. PPosey Pronto DO

## 2021-03-13 NOTE — Patient Instructions (Signed)
Return to clinic in 4 months.

## 2021-03-14 ENCOUNTER — Other Ambulatory Visit: Payer: Self-pay

## 2021-03-14 ENCOUNTER — Telehealth: Payer: Self-pay

## 2021-03-14 ENCOUNTER — Telehealth: Payer: Self-pay | Admitting: *Deleted

## 2021-03-14 ENCOUNTER — Telehealth: Payer: Self-pay | Admitting: Podiatry

## 2021-03-14 NOTE — Telephone Encounter (Signed)
Patient is calling for refill of her Voltaren 1% gel but said that B/C B/ medicare are now requiring a pre Aurthorization ([phone]. 5)after 90 days.May she purchase this OTC instead? Please advise.

## 2021-03-14 NOTE — Telephone Encounter (Signed)
Su Hilt do you mind doing the PA for this?

## 2021-03-14 NOTE — Telephone Encounter (Signed)
Called and informed patient but she said that medication is too expensive OTC,would only cost her $ 3.00 with the aurthorization approval.           43.00

## 2021-03-14 NOTE — Telephone Encounter (Signed)
Prior Authorization has been requested for a prescription for the following patient, Please Advise   [name] [phone] Option 5

## 2021-03-14 NOTE — Telephone Encounter (Signed)
What medication is it for?

## 2021-03-14 NOTE — Telephone Encounter (Signed)
[name] w/ [employer] is calling and has approved the Voltaren 1% gel, patient has been notified as of 03/14/21,approval for 1 year.

## 2021-03-14 NOTE — Telephone Encounter (Signed)
Called and spoke with [place] and she said that there was no request sent in for Voltaren gel (generic).  Returned call to patient and explained that only a request had been sent for mupirocin 2%. The patient said that she will call and have them to resend.

## 2021-03-14 NOTE — Telephone Encounter (Signed)
Spoke with Patient today very upset about the process of prior authorization. I explained to the pt that the on call provider was in clinic. At this time the Voltaren prior auth was approved and the mupirocin 2% is still processing.

## 2021-03-14 NOTE — Telephone Encounter (Signed)
Patient called inquiring about prior authorization, stating all the prescriptions previously prescribed now requires prior authorization due to policy change, Please Advise

## 2021-03-14 NOTE — Progress Notes (Signed)
error 

## 2021-03-14 NOTE — Telephone Encounter (Signed)
[employer] states medical necessity needs to be provided for the following Mupriocine. States the request came in 03/10/2021, Please Advise

## 2021-03-14 NOTE — Telephone Encounter (Signed)
Yes and that's what I would recommend. Most pharmacies have it in [place] now. I believe you can order off [facility] as well. [facility] has a 3 pack for ~45$

## 2021-03-15 ENCOUNTER — Other Ambulatory Visit: Payer: Self-pay | Admitting: Podiatry

## 2021-03-15 NOTE — Telephone Encounter (Signed)
I completed the paperwork for the PA on Voltaren and for Mupirocin and gave them back to Ammie to fax on 03/14/2021. Thanks!

## 2021-03-16 NOTE — Telephone Encounter (Signed)
Please advise 

## 2021-03-28 ENCOUNTER — Other Ambulatory Visit: Payer: Self-pay

## 2021-03-28 ENCOUNTER — Ambulatory Visit: Payer: Medicare Other | Admitting: Podiatry

## 2021-03-28 DIAGNOSIS — S91209A Unspecified open wound of unspecified toe(s) with damage to nail, initial encounter: Secondary | ICD-10-CM | POA: Diagnosis not present

## 2021-03-28 DIAGNOSIS — L97521 Non-pressure chronic ulcer of other part of left foot limited to breakdown of skin: Secondary | ICD-10-CM | POA: Diagnosis not present

## 2021-03-30 ENCOUNTER — Telehealth: Payer: Self-pay | Admitting: Pulmonary Disease

## 2021-03-30 NOTE — Telephone Encounter (Signed)
Spoke to [place] with [employer], who is wanting to know if patient can use a Personnel officer.   Dr. Elsworth Soho, please advise. Thanks.

## 2021-04-02 NOTE — Progress Notes (Signed)
Subjective: 55 year old female presents the office today for follow-up evaluation of a wound on the left foot.  She is continue with antibiotic ointment dressings.  She states that it was doing well however she peeled the skin off and the toe started to bleed.  Also the nail came off.  Denies any redness or drainage or any swelling.  She has no other concerns today. Denies any systemic complaints such as fevers, chills, nausea, vomiting. No acute changes since last appointment, and no other complaints at this time.   Objective: AAO x3, NAD DP/PT pulses palpable bilaterally, CRT less than 3 seconds  LEFT: Hyperkeratotic tissue present plantar aspect of hallux with superficial granular wound, skin breakdown.  No nail is present there is a granular wound on the nail bed.  There is no edema, erythema, drainage or pus or any obvious signs of infection noted today.  No pain with calf compression, swelling, warmth, erythema bilaterally  Assessment: Left hallux ulceration  Plan: -All treatment options discussed with the patient including all alternatives, risks, complications.  -LEFT: Sharply debrided the hyperkeratotic tissue to reveal superficial underlying ulceration. debrided the wound today utilizing the 312 with scalpel down to healthy, bleeding, granular tissue.  This is to promote wound healing.  I cleaned the wound.  Recommended returning to using the [facility] dressing.  Continue with daily dressing changes. -She cannot use the Hydrofera Blue along the area of the toenail avulsion. -Patient encouraged to call the office with any questions, concerns, change in symptoms.   Trula Slade DPM

## 2021-04-03 NOTE — Telephone Encounter (Signed)
Message sent to [employer] to let them know that RA states Wynonia Musty ok if covered by insurance.  We do not get anything for pt's insurance for that kind of coverage.  That will need to go thru the DME.

## 2021-04-03 NOTE — Telephone Encounter (Signed)
Called spoke with patient. She said as long as the machine and supplies are covered by her insurance plan she will go with the LUNA.  Will route to PCC's to follow up on insurance coverage, if covered it is okay to order a Luna for patient per Dr. Elsworth Soho

## 2021-04-03 NOTE — Telephone Encounter (Signed)
Okay with me if patient is willing

## 2021-04-18 ENCOUNTER — Ambulatory Visit: Payer: Medicare Other | Admitting: Podiatry

## 2021-04-18 ENCOUNTER — Other Ambulatory Visit: Payer: Self-pay

## 2021-04-18 DIAGNOSIS — E1140 Type 2 diabetes mellitus with diabetic neuropathy, unspecified: Secondary | ICD-10-CM

## 2021-04-18 DIAGNOSIS — E1149 Type 2 diabetes mellitus with other diabetic neurological complication: Secondary | ICD-10-CM

## 2021-04-18 DIAGNOSIS — L97521 Non-pressure chronic ulcer of other part of left foot limited to breakdown of skin: Secondary | ICD-10-CM | POA: Diagnosis not present

## 2021-04-19 NOTE — Progress Notes (Signed)
Subjective: 55 year old female presents the office today for follow-up evaluation of a wound on the left foot.  She is to using the [facility] dressings.  She states the wound is still present.  Upon discussion with her we had the wound healed and opened back up.  She thinks this is because she hit her foot regularly on different objects.  Also she states that when she wears socks other than her diabetic socks she is tends to slide in her shoes.  Currently denies any fevers, chills, nausea, vomiting.  No calf pain, chest pain, shortness of breath.   Objective: AAO x3, NAD DP/PT pulses palpable bilaterally, CRT less than 3 seconds  LEFT: Hyperkeratotic tissue present plantar aspect of hallux with granular ulceration.  After debridement the wound measures 1 x 0.5 cm with a depth of 0.1 cm.  There is no probing, amount or tunneling.  No surrounding erythema, ascending cellulitis.  There is no fluctuation crepitation.  No malodor.  Nailbed is healed.  No other ulcerations. No pain with calf compression, swelling, warmth, erythema bilaterally  Assessment: Left hallux ulceration  Plan: -All treatment options discussed with the patient including all alternatives, risks, complications. -Sharply debrided the hyperkeratotic lesion, ulceration the left hallux with any complications utilizing the 312 with scalpel.  It was completely removed nonviable devitalized tissue in order to help promote wound healing.  She tolerated well with minimal blood loss.  Continue Hydrofera Blue dressing changes.  Offloading at all times.  Monitor for any signs or symptoms of infection.  Return in about 3 weeks (around 05/09/2021).  Trula Slade DPM

## 2021-04-20 ENCOUNTER — Other Ambulatory Visit (HOSPITAL_COMMUNITY): Payer: Self-pay | Admitting: Endocrinology

## 2021-04-20 DIAGNOSIS — C73 Malignant neoplasm of thyroid gland: Secondary | ICD-10-CM

## 2021-04-29 ENCOUNTER — Other Ambulatory Visit: Payer: Self-pay | Admitting: Internal Medicine

## 2021-05-01 ENCOUNTER — Encounter (HOSPITAL_COMMUNITY)
Admission: RE | Admit: 2021-05-01 | Discharge: 2021-05-01 | Disposition: A | Discharge status: Home / Self Care | Payer: Medicare Other | Source: Ambulatory Visit | Attending: Endocrinology | Admitting: Endocrinology

## 2021-05-01 ENCOUNTER — Other Ambulatory Visit: Payer: Self-pay

## 2021-05-01 DIAGNOSIS — C73 Malignant neoplasm of thyroid gland: Secondary | ICD-10-CM

## 2021-05-01 MED ORDER — THYROTROPIN ALFA 0.9 MG IM SOLR
INTRAMUSCULAR | Status: AC
  Administered 2021-05-02: 0.9 mg via INTRAMUSCULAR
  Filled 2021-05-01: qty 0.9

## 2021-05-01 MED ORDER — THYROTROPIN ALFA 0.9 MG IM SOLR: 0.9000 mg | INTRAMUSCULAR | Status: DC

## 2021-05-02 ENCOUNTER — Encounter (HOSPITAL_COMMUNITY)
Admission: RE | Admit: 2021-05-02 | Discharge: 2021-05-02 | Disposition: A | Discharge status: Home / Self Care | Payer: Medicare Other | Source: Ambulatory Visit | Attending: Endocrinology | Admitting: Endocrinology

## 2021-05-02 DIAGNOSIS — C73 Malignant neoplasm of thyroid gland: Secondary | ICD-10-CM | POA: Diagnosis not present

## 2021-05-02 MED ORDER — THYROTROPIN ALFA 0.9 MG IM SOLR
0.9000 mg | INTRAMUSCULAR | Status: AC
  Administered 2021-05-02: 0.9 mg via INTRAMUSCULAR

## 2021-05-02 MED ORDER — THYROTROPIN ALFA 0.9 MG IM SOLR
INTRAMUSCULAR | Status: AC
  Filled 2021-05-02: qty 0.9

## 2021-05-03 ENCOUNTER — Other Ambulatory Visit: Payer: Self-pay

## 2021-05-03 ENCOUNTER — Encounter (HOSPITAL_COMMUNITY)
Admission: RE | Admit: 2021-05-03 | Discharge: 2021-05-03 | Disposition: A | Discharge status: Home / Self Care | Payer: Medicare Other | Source: Ambulatory Visit | Attending: Endocrinology | Admitting: Endocrinology

## 2021-05-03 DIAGNOSIS — C73 Malignant neoplasm of thyroid gland: Secondary | ICD-10-CM | POA: Diagnosis not present

## 2021-05-03 MED ORDER — SODIUM IODIDE I 131 CAPSULE
4.1800 | Freq: Once | INTRAVENOUS | Status: AC | PRN
  Administered 2021-05-03: 4.18 via ORAL

## 2021-05-05 ENCOUNTER — Encounter (HOSPITAL_COMMUNITY)
Admission: RE | Admit: 2021-05-05 | Discharge: 2021-05-05 | Disposition: A | Discharge status: Home / Self Care | Payer: Medicare Other | Source: Ambulatory Visit | Attending: Endocrinology | Admitting: Endocrinology

## 2021-05-05 ENCOUNTER — Other Ambulatory Visit: Payer: Self-pay

## 2021-05-05 DIAGNOSIS — C73 Malignant neoplasm of thyroid gland: Secondary | ICD-10-CM | POA: Insufficient documentation

## 2021-05-15 ENCOUNTER — Other Ambulatory Visit: Payer: Self-pay

## 2021-05-15 ENCOUNTER — Ambulatory Visit: Payer: Medicare Other | Admitting: Podiatry

## 2021-05-15 DIAGNOSIS — E1149 Type 2 diabetes mellitus with other diabetic neurological complication: Secondary | ICD-10-CM

## 2021-05-15 DIAGNOSIS — L97521 Non-pressure chronic ulcer of other part of left foot limited to breakdown of skin: Secondary | ICD-10-CM | POA: Diagnosis not present

## 2021-05-15 DIAGNOSIS — E1140 Type 2 diabetes mellitus with diabetic neuropathy, unspecified: Secondary | ICD-10-CM

## 2021-05-19 NOTE — Progress Notes (Signed)
Subjective: 55 year old female presents the office today for follow-up evaluation of a wound on the left foot.  She said that she is doing well without any new concerns today.  Denies any swelling or redness or any drainage or pus.  Denies any fevers, chills, nausea, vomiting.  She is continue Hydrofera Blue dressing changes.  Objective: AAO x3, NAD DP/PT pulses palpable bilaterally, CRT less than 3 seconds  LEFT: Hyperkeratotic tissue present plantar aspect of hallux with granular ulceration.  After debridement the wound measures 0.6 x 0.4 cm with a depth of 0.1 cm.  Periwound measurements are 0.5 x 0.4 cm with hyperkeratotic tissue along periwound.  There is no probing, amount or tunneling.  No surrounding erythema, ascending cellulitis.  There is no fluctuation crepitation.  No malodor.  Nailbed is healed.  No other ulcerations. No pain with calf compression, swelling, warmth, erythema bilaterally  Assessment: Left hallux ulceration  Plan: -All treatment options discussed with the patient including all alternatives, risks, complications. -Sharply debrided the hyperkeratotic lesion, ulceration the left hallux with any complications utilizing the 312 with scalpel.  It was performed in order to removed nonviable devitalized tissue in order to help promote wound healing.  She tolerated well with minimal blood loss.  Continue Hydrofera Blue dressing changes.  Offloading at all times.  Monitor for any signs or symptoms of infection.  Return in about 3 weeks (around 06/05/2021) for ulcer.  Trula Slade DPM

## 2021-05-31 ENCOUNTER — Telehealth: Payer: Self-pay | Admitting: Neurology

## 2021-05-31 NOTE — Telephone Encounter (Signed)
Lm on vm, said this is her 3x calling regarding the infusion. She would like a call back. Home # [phone] and cell # [phone]

## 2021-05-31 NOTE — Telephone Encounter (Signed)
IVIG order sent to [facility] to change q 6 weeks, and patient advised of the change and to follow up with Dr.Patel as scheduled.

## 2021-05-31 NOTE — Telephone Encounter (Signed)
If she is continuing to get IVIG, I suggest spacing it out to every 6 weeks and follow her. We need to taper it. Please let Asencion Partridge at [facility] know so new orders can be faxed. Thanks

## 2021-05-31 NOTE — Telephone Encounter (Signed)
Patient has still being received IVIG, per your notes to hold back in April. The infusion is being done from [facility]. Patient has had a infusion in may and June. [facility] has an active order, next appt is in August with Dr.Patel. Patient doesn't want to stop getting them, she says it helps. Please clarify does further orders need to be D'ced.

## 2021-06-01 ENCOUNTER — Ambulatory Visit: Payer: Medicare Other | Admitting: Podiatry

## 2021-06-20 ENCOUNTER — Ambulatory Visit: Payer: Medicare Other | Admitting: Pulmonary Disease

## 2021-06-20 ENCOUNTER — Other Ambulatory Visit: Payer: Self-pay

## 2021-06-20 ENCOUNTER — Encounter: Payer: Self-pay | Admitting: Pulmonary Disease

## 2021-06-20 DIAGNOSIS — G4733 Obstructive sleep apnea (adult) (pediatric): Secondary | ICD-10-CM | POA: Diagnosis not present

## 2021-06-20 DIAGNOSIS — J4530 Mild persistent asthma, uncomplicated: Secondary | ICD-10-CM | POA: Diagnosis not present

## 2021-06-20 DIAGNOSIS — J986 Disorders of diaphragm: Secondary | ICD-10-CM | POA: Diagnosis not present

## 2021-06-20 MED ORDER — FLUTICASONE FUROATE-VILANTEROL 100-25 MCG/INH IN AEPB: 1.0000 | INHALATION_SPRAY | Freq: Every day | RESPIRATORY_TRACT | 1 refills | Status: DC

## 2021-06-20 NOTE — Patient Instructions (Signed)
Refill s on Breo 100 Change autoCPAP to 5-12 cm  You are doing well

## 2021-06-20 NOTE — Assessment & Plan Note (Signed)
Again CPAP would benefit diaphragmatic weakness too in the long run

## 2021-06-20 NOTE — Assessment & Plan Note (Signed)
CPAP download was reviewed which shows good control of events on auto settings 5 to 15 cm with average pressure of 10 and maximum pressure of 11 cm.  Compliance is reasonable about 6 hours per night, and minimal leak is present. CPAP supplies will be renewed for a year  Weight loss encouraged, compliance with goal of at least 4-6 hrs every night is the expectation. Advised against medications with sedative side effects Cautioned against driving when sleepy - understanding that sleepiness will vary on a day to day basis

## 2021-06-20 NOTE — Assessment & Plan Note (Signed)
Treating with Breo and this is decrease albuterol usage, so we will continue

## 2021-06-20 NOTE — Progress Notes (Signed)
   Subjective:    Patient ID: [name], female    DOB: [birth year], 55 y.o.   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      PMH -CIDP as above, dysautonomia Caryl Comes) , orthostatic hypotension with syncope and multiple falls, proteinuria/CKD Hollie Salk ) , hypertension, thyroid cancer status post thyroidectomy , fibromyalgia , IDDM on insulin pump  She was initially referred because visiting nurse heard decreased breath sounds on the right.  Found to have right diaphragmatic weakness, mild OSA on HST and PFT showing reversible airway obstruction.    Accompanied by her husband [name] -obtain history On her last visit, we placed her on Breo, this is helped her breathing, decreased albuterol usage. We started on CPAP therapy and she has overall done well with this, uses full facemask, denies leak or pressure issues.  Feels rested when she wakes up in the morning. Some nights she is unable to sleep especially when she is due for her injections   Significant tests/ events reviewed   HST 01/2021 mild OSA, AHI 7/h 11/2020 Sniff test decreased excursion of the right hemidiaphragm  PFT 12/2020 moderate reversible obstruction, ratio 85, FEV1 57%, FVC 62%, significant bronchodilator response improved by 0.54 to 81%, TLC 80%, DLCO 108%  Chest x-ray 11/18/2020 independently reviewed shows low volumes elevated right hemidiaphragm. Chest x-ray 02/2015 also shows lower volumes and similar mild elevation of right hemidiaphragm.  Review of Systems neg for any significant sore throat, dysphagia, itching, sneezing, nasal congestion or excess/ purulent secretions, fever, chills, sweats, unintended wt loss, pleuritic or exertional cp,  hempoptysis, orthopnea pnd or change in chronic leg swelling. Also denies presyncope, palpitations, heartburn, abdominal pain, nausea, vomiting, diarrhea or change in bowel or urinary habits, dysuria,hematuria, rash, arthralgias, visual complaints, headache, numbness weakness or ataxia.     Objective:   Physical Exam   Gen. Pleasant, obese, in no distress ENT - no lesions, no post nasal drip Neck: No JVD, no thyromegaly, no carotid bruits Lungs: no use of accessory muscles, no dullness to percussion, decreased without rales or rhonchi  Cardiovascular: Rhythm regular, heart sounds  normal, no murmurs or gallops, no peripheral edema Musculoskeletal: No deformities, no cyanosis or clubbing , no tremors      Assessment & Plan:

## 2021-06-22 ENCOUNTER — Ambulatory Visit: Payer: Medicare Other | Admitting: Podiatry

## 2021-06-22 ENCOUNTER — Other Ambulatory Visit: Payer: Self-pay

## 2021-06-22 VITALS — BP 144/80 | HR 77 | Temp 97.6°F

## 2021-06-22 DIAGNOSIS — E1140 Type 2 diabetes mellitus with diabetic neuropathy, unspecified: Secondary | ICD-10-CM

## 2021-06-22 DIAGNOSIS — L97521 Non-pressure chronic ulcer of other part of left foot limited to breakdown of skin: Secondary | ICD-10-CM | POA: Diagnosis not present

## 2021-06-22 DIAGNOSIS — E1149 Type 2 diabetes mellitus with other diabetic neurological complication: Secondary | ICD-10-CM | POA: Diagnosis not present

## 2021-06-22 MED ORDER — DOXYCYCLINE HYCLATE 100 MG PO TABS: 100.0000 mg | ORAL_TABLET | Freq: Two times a day (BID) | ORAL | 0 refills | Status: DC

## 2021-06-26 ENCOUNTER — Other Ambulatory Visit: Payer: Self-pay | Admitting: Pulmonary Disease

## 2021-06-27 ENCOUNTER — Encounter: Payer: Self-pay | Admitting: Internal Medicine

## 2021-06-27 ENCOUNTER — Ambulatory Visit (INDEPENDENT_AMBULATORY_CARE_PROVIDER_SITE_OTHER): Payer: Medicare Other | Admitting: Internal Medicine

## 2021-06-27 ENCOUNTER — Other Ambulatory Visit: Payer: Self-pay

## 2021-06-27 VITALS — BP 112/70 | HR 75 | Ht 66.0 in | Wt 303.0 lb

## 2021-06-27 DIAGNOSIS — R55 Syncope and collapse: Secondary | ICD-10-CM

## 2021-06-27 DIAGNOSIS — I951 Orthostatic hypotension: Secondary | ICD-10-CM

## 2021-06-27 NOTE — Progress Notes (Signed)
Subjective: 55 year old female presents the office today for follow-up evaluation of a wound on the left foot.  States that she has not seen any swelling or redness or any drainage.  No bleeding.  She states that the wound is drying out.  She has no pain although she does have neuropathy.  She does continue with the AFO brace.  A strap has been added and prevents her foot from sliding.  Denies any swelling or redness or any drainage or pus.  Denies any fevers, chills, nausea, vomiting.  Objective: AAO x3, NAD DP/PT pulses palpable bilaterally, CRT less than 3 seconds  LEFT: Hyperkeratotic tissue present plantar aspect of hallux with granular ulceration.  After debridement the wound measures 0.65x 0.4 cm with a depth of 0.1 cm.  There is no probing, amount or tunneling.  No surrounding erythema, ascending cellulitis.  There is no fluctuation crepitation.  No malodor.  Nailbed is healed.  No other ulcerations. No pain with calf compression, swelling, warmth, erythema bilaterally  Assessment: Left hallux ulceration  Plan: -All treatment options discussed with the patient including all alternatives, risks, complications. -Sharply debrided the hyperkeratotic lesion, ulceration the left hallux with any complications utilizing the 312 with scalpel.  It was performed in order to removed nonviable devitalized tissue in order to help promote wound healing.  She tolerated well with minimal blood loss.  Continue Hydrofera Blue dressing changes.  Offloading at all times.  Monitor for any signs or symptoms of infection. -Not at the brace is more stable to help further offload and prevent the foot from sliding.  Trula Slade DPM

## 2021-06-27 NOTE — Patient Instructions (Signed)
Medication Instructions:  Your physician has recommended you make the following change in your medication:   ** Stop Aspirin ** Take your Lisinopril in the evening ** Take your Metoprolol in the morning  *If you need a refill on your cardiac medications before your next appointment, please call your pharmacy*   Lab Work: None ordered.  If you have labs (blood work) drawn today and your tests are completely normal, you will receive your results only by: [place] (if you have MyChart) OR A paper copy in the mail If you have any lab test that is abnormal or we need to change your treatment, we will call you to review the results.   Testing/Procedures: None ordered.    Follow-Up: At [facility], you and your health needs are our priority.  As part of our continuing mission to provide you with exceptional heart care, we have created designated Provider Care Teams.  These Care Teams include your primary Cardiologist (physician) and Advanced Practice Providers (APPs -  Physician Assistants and Nurse Practitioners) who all work together to provide you with the care you need, when you need it.  We recommend signing up for the patient portal called "MyChart".  Sign up information is provided on this After Visit Summary.  MyChart is used to connect with patients for Virtual Visits (Telemedicine).  Patients are able to view lab/test results, encounter notes, upcoming appointments, etc.  Non-urgent messages can be sent to your provider as well.   To learn more about what you can do with MyChart, go to [email or web address].    Your next appointment:   6 month(s)  The format for your next appointment:   In Person  Provider:   Virl Axe, MD

## 2021-06-27 NOTE — Progress Notes (Signed)
Patient Care Team: Reynold Bowen, MD as PCP - General (Endocrinology) Alda Berthold, DO as Consulting Physician (Neurology)   HPI  [name] is a 55 y.o. female Seen in follow-up for recurrent syncope with hypertension, documented orthostatic hypotension in the context of diabetes with likely some degree of neuropathy;  intercurrently diagnosed with CIDP   She has continued to have problems with falls.  Trauma.  Nephrology has been managing hypertension and notably, systolic hypertension has precluded sodium loading for volume support  Strategies employed have included 1-abdominal binder associate with skin erosion 2-fluids 3-beta-blockers improving exercise tolerance  Discussed pyridostigmine    Records and Results Reviewed   Past Medical History:  Diagnosis Date   Anginal pain ([place])    chest pain  heart cath done   Anxiety    Cancer [facility])    thyroid cancer   Chronic bronchitis ([place])    "get it q yr"   Chronic inflammatory demyelinating polyneuropathy ([facility])    Chronic kidney disease    Proteinuria   Dyspnea    sob with exertion   Early cataract    right   Fibromyalgia    GERD (gastroesophageal reflux disease)    Heart murmur    Hepatic adenoma    High cholesterol    History of gout    History of hiatal hernia    History of kidney stones    Hypertension    Hypothyroidism    IBS (irritable bowel syndrome)    Iron deficiency anemia    "comes and goes"   Migraine    "@ least once/month" (02/18/2015)   Obesity    PONV (postoperative nausea and vomiting)    patch and anti nausea meds work well   Proteinuria    Spinal headache    Syncope    Tachycardia    Type II diabetes mellitus ([place])    pt has an insulin pump   Wears glasses     Past Surgical History:  Procedure Laterality Date   ABDOMINAL HYSTERECTOMY  ~2012   "lap"   ANKLE RECONSTRUCTION Left 03/29/2016   Procedure: LEFT ANKLE LATERAL LIGAMENT RECONSTRUCTION AND PERONEAL TENDON  REPAIR OR TENOLYSIS;  Surgeon: Wylene Simmer, MD;  Location: [place];  Service: Orthopedics;  Laterality: Left;   BACK SURGERY     CARDIAC CATHETERIZATION  04/2009   "clean"   COLONOSCOPY  01/12/2015   Colonic polyps status post polypectomy, small internal hemorrhoids. Otherwise normal. Bx: mild chronic gastritis.   CYSTOSCOPY WITH URETEROSCOPY, STONE BASKETRY AND STENT PLACEMENT  ~ 2006   ESOPHAGOGASTRODUODENOSCOPY  07/21/2015   Esophageal stricture status post esophageal dilatation. Mild gastritis.   insulin pump     U 500   IR IMAGING GUIDED PORT INSERTION  09/22/2018   IRRIGATION AND DEBRIDEMENT KNEE Left 04/03/2018   Procedure: Arthroscopic versus open irrigation and debridement  left knee;  Surgeon: Nicholes Stairs, MD;  Location: [place];  Service: Orthopedics;  Laterality: Left;   LAPAROSCOPIC CHOLECYSTECTOMY  05/2004   LUMBAR [place] SURGERY  08/2011   SURAL NERVE BX Left 09/30/2017   Procedure: LEFT SURAL NERVE AND MUSCLE BIOPSY;  Surgeon: Newman Pies, MD;  Location: [place];  Service: Neurosurgery;  Laterality: Left;  LEFT SURAL NERVE AND MUSCLE BIOPSY   THYROIDECTOMY N/A 02/18/2015   Procedure: TOTAL THYROIDECTOMY;  Surgeon: [name], MD;  Location: [place];  Service: General;  Laterality: N/A;   TOTAL THYROIDECTOMY  02/18/2015    Current Outpatient Medications  Medication Sig Dispense Refill   acetaminophen (TYLENOL) 500 MG tablet Take 1,000 mg by mouth every 6 (six) hours as needed for moderate pain or headache.      albuterol (VENTOLIN HFA) 108 (90 Base) MCG/ACT inhaler Inhale 2 puffs into the lungs every 6 (six) hours as needed for wheezing or shortness of breath. 8 g 6   ALPRAZolam (XANAX) 0.5 MG tablet Take 0.5 mg by mouth every 6 (six) hours as needed for anxiety or sleep.      APPLE CIDER VINEGAR PO Take 1 each by mouth daily. One gummy once a day     aspirin EC 81 MG tablet Take 81 mg by mouth daily.     atorvastatin (LIPITOR) 20 MG tablet Take 20 mg by mouth daily at 6  PM.      azelastine (ASTELIN) 0.1 % nasal spray      b complex vitamins tablet Take 1 tablet by mouth daily.     Bayer Microlet Lancets lancets 4 each by Other route 4 (four) times daily.     benzonatate (TESSALON) 100 MG capsule Take 100 mg by mouth 3 (three) times daily as needed.     cetirizine (ZYRTEC) 10 MG tablet Take 10 mg by mouth at bedtime.     clobetasol cream (TEMOVATE) 2.83 % Apply 1 application topically 2 (two) times daily as needed.      cyclobenzaprine (FLEXERIL) 10 MG tablet Take 10 mg by mouth See admin instructions. Take 10 mg by mouth at bedtime. Take 10 mg by mouth every 8 hours as needed for muscle spasms     D3-50 50000 units capsule Take 50,000 Units by mouth every Saturday.  4   dapagliflozin propanediol (FARXIGA) 5 MG TABS tablet Take by mouth daily.     diclofenac Sodium (VOLTAREN) 1 % GEL APPLY 2 GRAMS TO AFFECTED AREA(S) OF FOOT 2-4 TIMES DAILY. 100 g 0   dicyclomine (BENTYL) 10 MG capsule Take 1 capsule (10 mg total) by mouth 4 (four) times daily as needed for spasms. 120 capsule 11   doxycycline (PERIOSTAT) 20 MG tablet Take 20 mg by mouth 2 (two) times daily.   3   doxycycline (VIBRA-TABS) 100 MG tablet Take 1 tablet (100 mg total) by mouth 2 (two) times daily. 20 tablet 0   DULoxetine (CYMBALTA) 60 MG capsule Take 60 mg by mouth daily.     fluticasone (FLONASE) 50 MCG/ACT nasal spray Place 1 spray into both nostrils at bedtime.     fluticasone furoate-vilanterol (BREO ELLIPTA) 100-25 MCG/INH AEPB Inhale 1 puff into the lungs daily. 120 each 1   furosemide (LASIX) 40 MG tablet Take 40 mg by mouth daily. As needed for SOB or leg swelling     gabapentin (NEURONTIN) 300 MG capsule TAKE 2 CAPSULES BY MOUTH THREE TIMES DAILY 540 capsule 0   GAMUNEX-C 20 GM/200ML SOLN      glucose blood (CONTOUR NEXT TEST) test strip 4 each by Other route 4 (four) times daily.     HYDROcodone-acetaminophen (NORCO) 7.5-325 MG tablet Take 1-2 tablets by mouth every 6 (six) hours as  needed for moderate pain (for pain.). 40 tablet 0   HYDROMET 5-1.5 MG/5ML syrup Take 5 mLs by mouth every 6 (six) hours as needed.     hyoscyamine (LEVSIN SL) 0.125 MG SL tablet Take 1-2 tablets under the tongue every 6-8 hours as needed. 180 tablet 2   immune globulin, human, (GAMMAGARD S/D LESS IGA) 10 g injection Inject 40 g into  the vein every 21 ( twenty-one) days. 40 mg daily for 3 days every 3 weeks     Insulin Human (INSULIN PUMP) SOLN Inject into the skin.     insulin regular human CONCENTRATED (HUMULIN R) 500 UNIT/ML injection Inject 2 Units into the skin See admin instructions. 2.0 units /hour 0500-2359, 1.95units 8101-7510 with carb ratio 1:50 for meal time bolus     ketoconazole (NIZORAL) 2 % cream Apply 1 application topically 2 (two) times daily as needed (for toes).      levothyroxine (SYNTHROID, LEVOTHROID) 137 MCG tablet Take 274 mcg by mouth daily before breakfast.     lisinopril (ZESTRIL) 10 MG tablet Take 30 mg by mouth daily.     lisinopril (ZESTRIL) 20 MG tablet Take 20 mg by mouth daily.     metFORMIN (GLUCOPHAGE) 500 MG tablet Take by mouth 2 (two) times daily with a meal.     metoprolol succinate (TOPROL-XL) 50 MG 24 hr tablet TAKE 1 TABLET BY MOUTH IN THE MORNING AND 1/2 (ONE-HALF) IN THE EVENING 135 tablet 0   metroNIDAZOLE (FLAGYL) 500 MG tablet Take 500 mg by mouth 3 (three) times daily.     metroNIDAZOLE (METROCREAM) 0.75 % cream Apply 1 application topically 2 (two) times daily.     Multiple Vitamin (MULTIVITAMIN) tablet Take 1 tablet by mouth at bedtime.      mupirocin ointment (BACTROBAN) 2 % APPLY  OINTMENT TOPICALLY TO AFFECTED AREA TWICE DAILY 22 g 0   ondansetron (ZOFRAN-ODT) 8 MG disintegrating tablet Take 8 mg by mouth every 6 (six) hours as needed.     pantoprazole (PROTONIX) 40 MG tablet Take 1 tablet (40 mg total) by mouth 2 (two) times daily. 60 tablet 11   PROCTOSOL HC 2.5 % rectal cream Apply 1 application topically 2 (two) times daily as needed for  hemorrhoids.   2   Promethazine-Codeine 6.25-10 MG/5ML SOLN Take 5 mLs by mouth 2 (two) times daily as needed.     silver sulfADIAZINE (SILVADENE) 1 % cream Apply 1 application topically daily. 50 g 0   Spacer/Aero-Holding Chambers (AEROCHAMBER MV) inhaler Use as instructed 1 each 0   Turmeric (QC TUMERIC COMPLEX PO) Take 1 tablet by mouth daily.     No current facility-administered medications for this visit.    Allergies  Allergen Reactions   Penicillins Anaphylaxis, Shortness Of Breath, Swelling, Palpitations and Other (See Comments)    Has patient had a PCN reaction causing immediate rash, facial/tongue/throat swelling, SOB or lightheadedness with hypotension: Yes Has patient had a PCN reaction causing severe rash involving mucus membranes or skin necrosis: No Has patient had a PCN reaction that required hospitalization No Has patient had a PCN reaction occurring within the last 10 years: No If all of the above answers are "NO", then may proceed with Cephalosporin use.    Clarithromycin Nausea And Vomiting and Other (See Comments)    Any antibiotic (doxyclycine, etc)   Other Other (See Comments)    All antibiotics makes patient sick   Adhesive [Tape] Itching and Other (See Comments)    Redness   Latex Itching, Rash and Other (See Comments)    Blisters with prolonged contact   Moxifloxacin Nausea And Vomiting and Other (See Comments)    Needs Zofran or Phenergan      Review of Systems negative except from HPI and PMH  Physical Exam  There were no vitals taken for this visit. Well developed and Morbidly obese in no acute distress HENT normal  Neck supple with JVP-flat Clear Regular rate and rhythm, no  murmur Abd-soft with active BS No Clubbing cyanosis   edema Skin-warm and dry A & Oriented  Grossly normal sensory and motor function  ECG sinus at 90 Interval 16/09/37 Axis left -38 Poor R wave progression  Assessment and  Plan   Orthostatic hypotension    Syncope   Diabetes   Morbid obesity  CIDP ( chronic inflammatory demyelinating polyneuropathy)  Hypertension   She troubles with low blood pressure recurrent falls.  While she has some hypertension, biggest risk I think in the short and intermediate term or falls and trauma.  Hence, I have taken the liberty of decreasing her antihypertensives significantly from lisinopril 20/40--20 at bedtime and for metoprolol 50/100--50 at bedtime.  We will follow-up with telehealth in about 4 weeks.  This will avoid the need to come to the office.  We spent more than 50% of our >25 min visit in face to face counseling regarding the above .

## 2021-06-27 NOTE — Progress Notes (Signed)
Patient Care Team: Reynold Bowen, MD as PCP - General (Endocrinology) Alda Berthold, DO as Consulting Physician (Neurology)   HPI  [name] is a 55 y.o. female Seen in follow-up for recurrent syncope with hypertension, documented orthostatic hypotension in the context of diabetes with likely some degree of neuropathy;  also diagnosed with CIDP     Nephrology has been managing hypertension and notably, systolic hypertension has precluded sodium loading for volume support  Strategies employed have included 1-abdominal binder associate with skin erosion 2-fluids 3-beta-blockers improving exercise tolerance  Discussed pyridostigmine    Today, the patient denies chest pain, no lightheadedness .  Complains of fatigue in the later afternoon, Dyspnea assoc with out exertion,  < 10 ft  HR  increases  to 115-130.   She has mechanical falls. Using her assistive device she became unsteady and device rolled from under her gait.   Also with recurrent and without warning syncopal episodes. Once out of bed, the other out of her chair   She has raised the head of her bed.  She is using abdominal compression with a camisole and calf compression to significant benefit   Blood pressures are lower.  Complaints of worsening dyspnea on exertion.  Sleep study is abnormal.  Also found to have an elevated right hemidiaphragm and right sided volume loss.  Dr. Elsworth Soho is working on this.    She wears full upper abdominal  body compressions and wear LE compressions since then her edema has got better.  DATE TEST EF%   02/18 Echo  65-70 %   02/22 Echo  60-65 %         Date   Cr              K         Hgb   4/19 0.82 4.1         14.1(10/19)               Past Medical History:  Diagnosis Date   Anginal pain ([place])    chest pain  heart cath done   Anxiety    Cancer [facility])    thyroid cancer   Chronic bronchitis ([place])    "get it q yr"   Chronic inflammatory demyelinating  polyneuropathy ([facility])    Chronic kidney disease    Proteinuria   Dyspnea    sob with exertion   Early cataract    right   Fibromyalgia    GERD (gastroesophageal reflux disease)    Heart murmur    Hepatic adenoma    High cholesterol    History of gout    History of hiatal hernia    History of kidney stones    Hypertension    Hypothyroidism    IBS (irritable bowel syndrome)    Iron deficiency anemia    "comes and goes"   Migraine    "@ least once/month" (02/18/2015)   Obesity    PONV (postoperative nausea and vomiting)    patch and anti nausea meds work well   Proteinuria    Spinal headache    Syncope    Tachycardia    Type II diabetes mellitus ([place])    pt has an insulin pump   Wears glasses     Past Surgical History:  Procedure Laterality Date   ABDOMINAL HYSTERECTOMY  ~2012   "lap"   ANKLE RECONSTRUCTION Left 03/29/2016   Procedure: LEFT ANKLE LATERAL LIGAMENT RECONSTRUCTION AND PERONEAL TENDON  REPAIR OR TENOLYSIS;  Surgeon: Wylene Simmer, MD;  Location: [place];  Service: Orthopedics;  Laterality: Left;   BACK SURGERY     CARDIAC CATHETERIZATION  04/2009   "clean"   COLONOSCOPY  01/12/2015   Colonic polyps status post polypectomy, small internal hemorrhoids. Otherwise normal. Bx: mild chronic gastritis.   CYSTOSCOPY WITH URETEROSCOPY, STONE BASKETRY AND STENT PLACEMENT  ~ 2006   ESOPHAGOGASTRODUODENOSCOPY  07/21/2015   Esophageal stricture status post esophageal dilatation. Mild gastritis.   insulin pump     U 500   IR IMAGING GUIDED PORT INSERTION  09/22/2018   IRRIGATION AND DEBRIDEMENT KNEE Left 04/03/2018   Procedure: Arthroscopic versus open irrigation and debridement  left knee;  Surgeon: Nicholes Stairs, MD;  Location: [place];  Service: Orthopedics;  Laterality: Left;   LAPAROSCOPIC CHOLECYSTECTOMY  05/2004   LUMBAR [place] SURGERY  08/2011   SURAL NERVE BX Left 09/30/2017   Procedure: LEFT SURAL NERVE AND MUSCLE BIOPSY;  Surgeon: Newman Pies, MD;   Location: [place];  Service: Neurosurgery;  Laterality: Left;  LEFT SURAL NERVE AND MUSCLE BIOPSY   THYROIDECTOMY N/A 02/18/2015   Procedure: TOTAL THYROIDECTOMY;  Surgeon: [name], MD;  Location: [place];  Service: General;  Laterality: N/A;   TOTAL THYROIDECTOMY  02/18/2015    Current Outpatient Medications  Medication Sig Dispense Refill   acetaminophen (TYLENOL) 500 MG tablet Take 1,000 mg by mouth every 6 (six) hours as needed for moderate pain or headache.      albuterol (VENTOLIN HFA) 108 (90 Base) MCG/ACT inhaler Inhale 2 puffs into the lungs every 6 (six) hours as needed for wheezing or shortness of breath. 8 g 6   ALPRAZolam (XANAX) 0.5 MG tablet Take 0.5 mg by mouth every 6 (six) hours as needed for anxiety or sleep.      APPLE CIDER VINEGAR PO Take 1 each by mouth daily. One gummy once a day     aspirin EC 81 MG tablet Take 81 mg by mouth daily.     atorvastatin (LIPITOR) 20 MG tablet Take 20 mg by mouth daily at 6 PM.      azelastine (ASTELIN) 0.1 % nasal spray      b complex vitamins tablet Take 1 tablet by mouth daily.     Bayer Microlet Lancets lancets 4 each by Other route 4 (four) times daily.     cetirizine (ZYRTEC) 10 MG tablet Take 10 mg by mouth at bedtime.     clobetasol cream (TEMOVATE) 2.45 % Apply 1 application topically 2 (two) times daily as needed.      cyclobenzaprine (FLEXERIL) 10 MG tablet Take 10 mg by mouth See admin instructions. Take 10 mg by mouth at bedtime. Take 10 mg by mouth every 8 hours as needed for muscle spasms     D3-50 50000 units capsule Take 50,000 Units by mouth every Saturday.  4   dapagliflozin propanediol (FARXIGA) 5 MG TABS tablet Take by mouth daily.     diclofenac Sodium (VOLTAREN) 1 % GEL APPLY 2 GRAMS TO AFFECTED AREA(S) OF FOOT 2-4 TIMES DAILY. 100 g 0   dicyclomine (BENTYL) 10 MG capsule Take 1 capsule (10 mg total) by mouth 4 (four) times daily as needed for spasms. 120 capsule 11   doxycycline (PERIOSTAT) 20 MG tablet Take 20 mg by  mouth 2 (two) times daily.   3   doxycycline (VIBRA-TABS) 100 MG tablet Take 1 tablet (100 mg total) by mouth 2 (two) times daily. 20 tablet  0   DULoxetine (CYMBALTA) 60 MG capsule Take 60 mg by mouth daily.     fluticasone (FLONASE) 50 MCG/ACT nasal spray Place 1 spray into both nostrils at bedtime.     fluticasone furoate-vilanterol (BREO ELLIPTA) 100-25 MCG/INH AEPB Inhale 1 puff into the lungs daily. 120 each 1   furosemide (LASIX) 40 MG tablet Take 40 mg by mouth daily. As needed for SOB or leg swelling     gabapentin (NEURONTIN) 300 MG capsule TAKE 2 CAPSULES BY MOUTH THREE TIMES DAILY 540 capsule 0   GAMUNEX-C 20 GM/200ML SOLN      glucose blood (CONTOUR NEXT TEST) test strip 4 each by Other route 4 (four) times daily.     HYDROcodone-acetaminophen (NORCO) 7.5-325 MG tablet Take 1-2 tablets by mouth every 6 (six) hours as needed for moderate pain (for pain.). 40 tablet 0   hyoscyamine (LEVSIN SL) 0.125 MG SL tablet Take 1-2 tablets under the tongue every 6-8 hours as needed. 180 tablet 2   immune globulin, human, (GAMMAGARD S/D LESS IGA) 10 g injection Inject 40 g into the vein every 21 ( twenty-one) days. 40 mg daily for 3 days every 3 weeks     Insulin Human (INSULIN PUMP) SOLN Inject into the skin.     insulin regular human CONCENTRATED (HUMULIN R) 500 UNIT/ML injection Inject 2 Units into the skin See admin instructions. 2.0 units /hour 0500-2359, 1.95units 6010-9323 with carb ratio 1:50 for meal time bolus     ketoconazole (NIZORAL) 2 % cream Apply 1 application topically 2 (two) times daily as needed (for toes).      levothyroxine (SYNTHROID, LEVOTHROID) 137 MCG tablet Take 274 mcg by mouth daily before breakfast.     lisinopril (ZESTRIL) 10 MG tablet Take 30 mg by mouth daily.     lisinopril (ZESTRIL) 20 MG tablet Take 20 mg by mouth daily.     metFORMIN (GLUCOPHAGE) 500 MG tablet Take by mouth 2 (two) times daily with a meal.     metoprolol succinate (TOPROL-XL) 50 MG 24 hr tablet  TAKE 1 TABLET BY MOUTH IN THE MORNING AND 1/2 (ONE-HALF) IN THE EVENING 135 tablet 0   metroNIDAZOLE (METROCREAM) 0.75 % cream Apply 1 application topically 2 (two) times daily.     Multiple Vitamin (MULTIVITAMIN) tablet Take 1 tablet by mouth at bedtime.      mupirocin ointment (BACTROBAN) 2 % APPLY  OINTMENT TOPICALLY TO AFFECTED AREA TWICE DAILY 22 g 0   ondansetron (ZOFRAN-ODT) 8 MG disintegrating tablet Take 8 mg by mouth every 6 (six) hours as needed.     pantoprazole (PROTONIX) 40 MG tablet Take 1 tablet (40 mg total) by mouth 2 (two) times daily. 60 tablet 11   PROCTOSOL HC 2.5 % rectal cream Apply 1 application topically 2 (two) times daily as needed for hemorrhoids.   2   silver sulfADIAZINE (SILVADENE) 1 % cream Apply 1 application topically daily. 50 g 0   Spacer/Aero-Holding Chambers (AEROCHAMBER MV) inhaler Use as instructed 1 each 0   Turmeric (QC TUMERIC COMPLEX PO) Take 1 tablet by mouth daily.     No current facility-administered medications for this visit.    Allergies  Allergen Reactions   Penicillins Anaphylaxis, Shortness Of Breath, Swelling, Palpitations and Other (See Comments)    Has patient had a PCN reaction causing immediate rash, facial/tongue/throat swelling, SOB or lightheadedness with hypotension: Yes Has patient had a PCN reaction causing severe rash involving mucus membranes or skin necrosis: No Has patient  had a PCN reaction that required hospitalization No Has patient had a PCN reaction occurring within the last 10 years: No If all of the above answers are "NO", then may proceed with Cephalosporin use.    Clarithromycin Nausea And Vomiting and Other (See Comments)    Any antibiotic (doxyclycine, etc)   Other Other (See Comments)    All antibiotics makes patient sick   Adhesive [Tape] Itching and Other (See Comments)    Redness   Latex Itching, Rash and Other (See Comments)    Blisters with prolonged contact   Moxifloxacin Nausea And Vomiting and  Other (See Comments)    Needs Zofran or Phenergan   Review of Systems negative except from HPI and PMH  Physical Exam:BP 112/70 (BP Location: Left Arm, Patient Position: Sitting, Cuff Size: Large)   Pulse 75   Ht 5\' 6"  (1.676 m)   Wt (!) 303 lb (137.4 kg)   SpO2 92%   BMI 48.91 kg/m   BP 112/70 (BP Location: Left Arm, Patient Position: Sitting, Cuff Size: Large)   Pulse 75   Ht 5\' 6"  (1.676 m)   Wt (!) 303 lb (137.4 kg)   SpO2 92%   BMI 48.91 kg/m  Well developed and Morbidly obese in no acute distress HENT normal Neck supple with JVP-flat Clear Regular rate and rhythm, no  murmur Abd-soft with active BS No Clubbing cyanosis tr edema Skin-warm and dry A & Oriented  Grossly normal sensory and motor function  ECG sinus 75 14/09/40   Assessment and  Plan:   Orthostatic hypotension   Syncope   Diabetes   Morbid obesity  CIDP ( chronic inflammatory demyelinating polyneuropathy)  Hypertension   Volume and blood pressure are currently being managed by nephrology.  We will however change her metoprolol from the night until morning and the lisinopril from morning until night to hopefully accomplish some beta-blockade during exertion to mitigate her heart rate increases with effort.  Further up titration may be of benefit.  Her dyspnea is likely multifactorial.  Long discussion about the importance of exercise.  She is also undergoing pulmonary evaluation and hopefully some improved function with her right hemidiaphragm  Blood pressure is better controlled  CIDP is stable  . I,Stephanie Williams,acting as a Education administrator for Virl Axe, MD.,have documented all relevant documentation on the behalf of Virl Axe, MD,as directed by  Virl Axe, MD while in the presence of Virl Axe, MD.   I, Virl Axe, MD, have reviewed all documentation for this visit. The documentation on 06/27/21 for the exam, diagnosis, procedures, and orders are all accurate and complete.

## 2021-07-06 ENCOUNTER — Other Ambulatory Visit: Payer: Self-pay

## 2021-07-06 ENCOUNTER — Ambulatory Visit: Payer: Medicare Other | Admitting: Podiatry

## 2021-07-06 DIAGNOSIS — E1149 Type 2 diabetes mellitus with other diabetic neurological complication: Secondary | ICD-10-CM | POA: Diagnosis not present

## 2021-07-06 DIAGNOSIS — E1140 Type 2 diabetes mellitus with diabetic neuropathy, unspecified: Secondary | ICD-10-CM

## 2021-07-06 DIAGNOSIS — L97521 Non-pressure chronic ulcer of other part of left foot limited to breakdown of skin: Secondary | ICD-10-CM | POA: Diagnosis not present

## 2021-07-07 NOTE — Addendum Note (Signed)
Addended by: Cranford Mon R on: 07/07/2021 08:01 AM   Modules accepted: Orders

## 2021-07-07 NOTE — Progress Notes (Signed)
Subjective: 55 year old female presents the office today for follow-up evaluation of a wound on the left foot.  She is made by her husband.  She thinks the wound is doing better.  She has occasional bloody drainage but denies any pus or increase in swelling or redness.  Her husband is concerned about the way she is walking causing the wound as when she walks that she "grinds" the toe.  Denies any fevers or chills.  Objective: AAO x3, NAD DP/PT pulses palpable bilaterally, CRT less than 3 seconds  LEFT: Hyperkeratotic tissue present plantar aspect of hallux with granular ulceration.  After debridement the wound measures 0.3 x 0.3cm with a depth of 0.1 cm.  There is no probing, amount or tunneling.  No surrounding erythema, ascending cellulitis.  There is no fluctuation crepitation.  No malodor.  Nailbed is healed.  No other ulcerations. No pain with calf compression, swelling, warmth, erythema bilaterally  Assessment: Left hallux ulceration  Plan: -All treatment options discussed with the patient including all alternatives, risks, complications. -Sharply debrided the hyperkeratotic lesion, ulceration the left hallux with any complications utilizing the 312 with scalpel.  It was performed in order to removed nonviable devitalized tissue in order to help promote wound healing.  She tolerated well with minimal blood loss.  We will switch back to using Prisma for now.  Discussed every other day dressing changes. -I further offloaded her insert to take pressure off the wound -Refer to physical therapy for gait training at [facility] in [place]

## 2021-07-11 ENCOUNTER — Other Ambulatory Visit: Payer: Self-pay | Admitting: Neurology

## 2021-07-13 ENCOUNTER — Ambulatory Visit: Payer: Medicare Other | Admitting: Neurology

## 2021-07-20 ENCOUNTER — Other Ambulatory Visit: Payer: Self-pay

## 2021-07-20 ENCOUNTER — Ambulatory Visit: Payer: Medicare Other | Admitting: Podiatry

## 2021-07-20 DIAGNOSIS — E1149 Type 2 diabetes mellitus with other diabetic neurological complication: Secondary | ICD-10-CM

## 2021-07-20 DIAGNOSIS — L97521 Non-pressure chronic ulcer of other part of left foot limited to breakdown of skin: Secondary | ICD-10-CM | POA: Diagnosis not present

## 2021-07-20 DIAGNOSIS — E1140 Type 2 diabetes mellitus with diabetic neuropathy, unspecified: Secondary | ICD-10-CM | POA: Diagnosis not present

## 2021-07-25 NOTE — Progress Notes (Signed)
Subjective: 55 year old female presents the office today for follow-up evaluation of a wound on the left foot.  She states the wound is doing well not see any pus.  Has been continue with Prisma dressing changes.  Occasional bloody drainage.  She states that she did have a fall she passed out but no injury to the foot that she reports.  She is holding off on physical therapy for now as her husband is currently in therapy as well.  Denies any fevers or chills.  No other concerns.    Objective: AAO x3, NAD DP/PT pulses palpable bilaterally, CRT less than 3 seconds  LEFT: Hyperkeratotic tissue present plantar aspect of hallux with granular ulceration.  After debridement the wound measures 0.3 x 0.2cm with a depth of 0.1 cm.  There is no probing, amount or tunneling.  No surrounding erythema, ascending cellulitis.  There is no fluctuation crepitation.  No malodor.  No other ulcerations. No pain with calf compression, swelling, warmth, erythema bilaterally  Assessment: Left hallux ulceration, chronic  Plan: -All treatment options discussed with the patient including all alternatives, risks, complications. -Sharply debrided the hyperkeratotic lesion, ulceration the left hallux with any complications utilizing the 312 with scalpel.  It was performed in order to removed nonviable devitalized tissue in order to help promote wound healing.  She tolerated well with minimal blood loss.  We will switch back to using [facility] for now.  Discussed every other day dressing changes. -Unfortunately things difficulty heal this wound given her gait instability.  Also her hallux sits somewhat plantarflexed and putting pressure.  Were attempting offloading with the brace as well as padding inside of her shoe.,  Consider surgical intervention if needed to help.  Trula Slade DPM

## 2021-07-27 ENCOUNTER — Encounter: Payer: Self-pay | Admitting: *Deleted

## 2021-07-27 ENCOUNTER — Telehealth: Payer: Self-pay | Admitting: Internal Medicine

## 2021-07-27 NOTE — Telephone Encounter (Signed)
Patient states her [facility] is up for review and they are requesting a list of her office visits for the last 5 years. She states they do not need the office notes. She says another office she called said they would screenshot it for her.

## 2021-07-27 NOTE — Telephone Encounter (Signed)
Pt aware will email letter with office dates included. Per pt thought this would be okay ./cy

## 2021-07-31 ENCOUNTER — Other Ambulatory Visit: Payer: Self-pay | Admitting: Gastroenterology

## 2021-07-31 ENCOUNTER — Other Ambulatory Visit: Payer: Self-pay | Admitting: Podiatry

## 2021-07-31 ENCOUNTER — Other Ambulatory Visit: Payer: Self-pay | Admitting: Pulmonary Disease

## 2021-07-31 ENCOUNTER — Other Ambulatory Visit: Payer: Self-pay | Admitting: Neurology

## 2021-08-08 ENCOUNTER — Other Ambulatory Visit: Payer: Self-pay | Admitting: Gastroenterology

## 2021-08-10 ENCOUNTER — Ambulatory Visit: Payer: Medicare Other | Admitting: Podiatry

## 2021-08-10 ENCOUNTER — Ambulatory Visit (INDEPENDENT_AMBULATORY_CARE_PROVIDER_SITE_OTHER): Payer: Medicare Other

## 2021-08-10 VITALS — BP 113/68 | HR 82 | Temp 98.0°F

## 2021-08-10 DIAGNOSIS — E1140 Type 2 diabetes mellitus with diabetic neuropathy, unspecified: Secondary | ICD-10-CM

## 2021-08-10 DIAGNOSIS — L97521 Non-pressure chronic ulcer of other part of left foot limited to breakdown of skin: Secondary | ICD-10-CM | POA: Diagnosis not present

## 2021-08-10 DIAGNOSIS — E1149 Type 2 diabetes mellitus with other diabetic neurological complication: Secondary | ICD-10-CM

## 2021-08-10 MED ORDER — DOXYCYCLINE HYCLATE 100 MG PO TABS: 100.0000 mg | ORAL_TABLET | Freq: Two times a day (BID) | ORAL | 0 refills | Status: DC

## 2021-08-16 NOTE — Progress Notes (Signed)
Subjective: 55 year old female presents the office today for follow-up evaluation of a wound on the left foot.  She denies a small amount of bleeding but denies any pus.  She does get a little bit better.  No redness or any red streaks.  Denies any fevers or chills.  She has no other concerns today.   Objective: AAO x3, NAD DP/PT pulses palpable bilaterally, CRT less than 3 seconds  LEFT: Hyperkeratotic tissue present plantar aspect of hallux with granular ulceration.  After debridement the wound measures little larger today at 0.4 x 0.4 x 0.1 cm.  There is no surrounding erythema, ascending status post no fluctuance or crepitation there is no malodor.  There is some plantar flexion noted at the hallux at the MPJ which of these likely contribute to the weight as well as for gait instability. No pain with calf compression, swelling, warmth, erythema bilaterally  Assessment: Left hallux ulceration, chronic  Plan: -All treatment options discussed with the patient including all alternatives, risks, complications. -Sharply debrided the hyperkeratotic lesion, ulceration the left hallux with any complications utilizing the 312 with scalpel.  It was performed in order to removed nonviable devitalized tissue in order to help promote wound healing.  She tolerated well with minimal blood loss.  Continue with daily dressing changes. -Unfortunately things difficulty heal this wound given her gait instability.  Also her hallux sits somewhat plantarflexed and putting pressure.  Were attempting offloading with the brace as well as padding inside of her shoe.,  Consider surgical intervention if needed to help.  The wound has healed previously and has opened back up.  I am not sure how helpful will be that she is negative physical therapy to work on gait training.  Trula Slade DPM

## 2021-08-31 ENCOUNTER — Ambulatory Visit: Payer: Medicare Other | Admitting: Podiatry

## 2021-08-31 ENCOUNTER — Other Ambulatory Visit: Payer: Self-pay

## 2021-08-31 ENCOUNTER — Ambulatory Visit (INDEPENDENT_AMBULATORY_CARE_PROVIDER_SITE_OTHER): Payer: Medicare Other

## 2021-08-31 DIAGNOSIS — L97522 Non-pressure chronic ulcer of other part of left foot with fat layer exposed: Secondary | ICD-10-CM | POA: Diagnosis not present

## 2021-08-31 DIAGNOSIS — L97521 Non-pressure chronic ulcer of other part of left foot limited to breakdown of skin: Secondary | ICD-10-CM

## 2021-08-31 DIAGNOSIS — E1149 Type 2 diabetes mellitus with other diabetic neurological complication: Secondary | ICD-10-CM | POA: Diagnosis not present

## 2021-08-31 DIAGNOSIS — E1140 Type 2 diabetes mellitus with diabetic neuropathy, unspecified: Secondary | ICD-10-CM | POA: Diagnosis not present

## 2021-09-02 ENCOUNTER — Other Ambulatory Visit: Payer: Self-pay | Admitting: Gastroenterology

## 2021-09-06 NOTE — Progress Notes (Signed)
Subjective: 55 year old female presents the office today for follow-up evaluation of a wound on the left foot.  She states that she gets some bloody drainage at times but no pus.  She does get some swelling to the left foot but no erythema or warmth.  She has no fevers or chills.  She has no other concerns today.    Objective: AAO x3, NAD DP/PT pulses palpable bilaterally, CRT less than 3 seconds  LEFT: Hyperkeratotic tissue present plantar aspect of hallux with granular ulceration.  After debridement of the wound today does measure somewhat larger at 0.6 x 0.5 x 0.2 cm.  There is no probing, undermining or tunneling.  No significant edema or erythema to the hallux but there is some edema present to the foot without any erythema or warmth. There is some plantar flexion noted at the hallux at the MPJ which of these likely contribute to the weight as well as for gait instability. No pain with calf compression, swelling, warmth, erythema bilaterally  Assessment: Left hallux ulceration, chronic  Plan: -All treatment options discussed with the patient including all alternatives, risks, complications. -X-rays obtained reviewed.  No cortical destruction suggestive of osteomyelitis.  There is a lucency of the distal phalanx distally which has been chronic without any significant change. -Sharply debrided the hyperkeratotic lesion, ulceration the left hallux with any complications utilizing the 312 with scalpel.  It was performed in order to removed nonviable devitalized tissue in order to help promote wound healing.  She tolerated well with minimal blood loss.  Continue with daily dressing changes with Hydrofera Blue. -Discussed physical therapy to work on gait training as well  Trula Slade DPM

## 2021-09-11 ENCOUNTER — Ambulatory Visit: Payer: Medicare Other | Admitting: Gastroenterology

## 2021-09-11 ENCOUNTER — Encounter: Payer: Self-pay | Admitting: Gastroenterology

## 2021-09-11 ENCOUNTER — Other Ambulatory Visit: Payer: Self-pay

## 2021-09-11 VITALS — BP 118/72 | HR 96 | Ht 66.0 in | Wt 308.1 lb

## 2021-09-11 DIAGNOSIS — Z1211 Encounter for screening for malignant neoplasm of colon: Secondary | ICD-10-CM

## 2021-09-11 DIAGNOSIS — K219 Gastro-esophageal reflux disease without esophagitis: Secondary | ICD-10-CM

## 2021-09-11 MED ORDER — PANTOPRAZOLE SODIUM 40 MG PO TBEC: 40.0000 mg | DELAYED_RELEASE_TABLET | Freq: Two times a day (BID) | ORAL | 4 refills | Status: DC

## 2021-09-11 MED ORDER — DICYCLOMINE HCL 10 MG PO CAPS: 10.0000 mg | ORAL_CAPSULE | Freq: Three times a day (TID) | ORAL | 11 refills | Status: DC

## 2021-09-11 MED ORDER — HYOSCYAMINE SULFATE 0.125 MG SL SUBL: 0.2500 mg | SUBLINGUAL_TABLET | SUBLINGUAL | 1 refills | Status: DC | PRN

## 2021-09-11 MED ORDER — ONDANSETRON 8 MG PO TBDP: 8.0000 mg | ORAL_TABLET | Freq: Four times a day (QID) | ORAL | 4 refills | Status: DC | PRN

## 2021-09-11 NOTE — Progress Notes (Signed)
IMPRESSION and PLAN:    #1. H/O Colonic Polyps. S/P colon 06/2018, fair prep, Bx- TAs.  #2. GERD with eso stricture s/p EGD with dil 18 mm TTS 06/2018 with significant resolution of dysphagia.  Prev Bx neg for EoE.  #3. Fatty Liver.  Patient at risk for liver cirrhosis.  Has associated obesity, DM2, HLD. Dx 2001 on liver biopsy at [facility] showing stage I steatohepatitis.  H/O hepatic adenomas 1990s at [facility].  Being followed by serial ultrasounds. Neg autoimmune hepatitis profile, AMA, ceruloplasmin, alpha-1 antitrypsin, p-ANCA, acute hepatitis profile 09/2017.  #4. IBS-D. Neg random colonic bx for microscopic colitis 2008.   Plan:  -Continue Hyoscamine 0.125 mg sublingual 2 tablets before meals and and as needed. #120  11 refiils -Continue Bentyl 10 mg p.o. 4 times daily as needed #120, 11 refiils -Zofran 52m ODT Q8hrs prn #30, 4 refills -Continue Protonix 494mpo bid #60, 11 refills -This regimen has helped her a lot. -[place] next year -Cologuard test -She would like to hold off on EGD/colon at this time.      HPI:    Chief Complaint:   Jenna M BRAYLEN DENUNZIOs a [age].o. female  With PMH of DM2 req insulin, HTN, HLD, fatty liver, CIDP, fibromyalgia, dysautonomia, anxiety/depression, obesity, migraines thyroid cancer s/p resection with secondary hypothyroidism For follow-up visit Doing well from GI standpoint Here for medication refill.  Denies having any diarrhea as long as she takes Bentyl/hyoscyamine.  She is on a very strict regimen.  Her swallowing is much better.  She would occasionally have problems with solid food dysphagia if she tries to eat too fast.  No significant nausea/vomiting/postprandial abdominal bloating.  No fever chills or night sweats.  No recent weight loss.  Had LFTs at nephrology which were "stable". [place]/20[place]-fatty liver without any obvious cirrhosis.  Gets around with a walker due to progressive neuropathy.  She would like to Hold off EGD/colon at  this time.  She would like to get Cologuard screening test.  She does understand that if Cologuard is positive, she may need colonoscopy.  Her problems with dysphagia or not but significant-if she starts having more problems, she will reconsider.  Wt Readings from Last 3 Encounters:  09/11/21 (!) 308 lb 2 oz (139.8 kg)  06/27/21 (!) 303 lb (137.4 kg)  06/20/21 (!) 303 lb (137.4 kg)     Past Medical History:  Diagnosis Date   [facility]   chest pain  heart cath done   Anxiety    [facility]   [facility]   [facility]itis [facility]   "get it q yr"   Chronic inflammatory demyelinating polyneuropathy (H[facility])    Chronic kidney disease    Proteinuria   Dyspnea    sob with exertion   Early cataract    right   Fibromyalgia    GERD (gastroesophageal reflux disease)    Heart murmur    Hepatic adenoma    High cholesterol    History of gout    History of hiatal hernia    History of kidney stones    Hypertension    Hypothyroidism    IBS (irritable bowel syndrome)    Iron deficiency anemia    "comes and goes"   Migraine    "@ least once/month" (02/18/2015)   Obesity    PONV (postoperative nausea and vomiting)    patch and anti nausea meds work well   Proteinuria  Spinal headache    Syncope    Tachycardia    Type II diabetes mellitus ([place])    pt has an insulin pump   Wears glasses     Current Outpatient Medications  Medication Sig Dispense Refill   doxycycline (PERIOSTAT) 20 MG tablet Take 20 mg by mouth 2 (two) times daily.   3   hyoscyamine (LEVSIN SL) 0.125 MG SL tablet DISSOLVE 1 TO 2 TABLETS UNDER THE TONGUE EVERY 6 TO 8 HOURS AS NEEDED 180 tablet 0   acetaminophen (TYLENOL) 500 MG tablet Take 1,000 mg by mouth every 6 (six) hours as needed for moderate pain or headache.      albuterol (PROAIR HFA) 108 (90 Base) MCG/ACT inhaler 1 puff as needed     ALPRAZolam (XANAX) 0.5 MG tablet Take 1 tablet by mouth every 6 (six) hours as needed.     APPLE CIDER  VINEGAR PO Take 1 each by mouth daily. One gummy once a day     atorvastatin (LIPITOR) 20 MG tablet Take 1 tablet by mouth daily.     azelastine (ASTELIN) 0.1 % nasal spray      Azelastine HCl 137 MCG/SPRAY SOLN daily.     b complex vitamins tablet Take 1 tablet by mouth daily.     Bayer Microlet Lancets lancets 4 each by Other route 4 (four) times daily.     BINAXNOW COVID-19 AG HOME TEST KIT Use as Directed on the Package     Black Pepper-Turmeric (TURMERIC CURCUMIN) 04-999 MG CAPS See admin instructions.     BREO ELLIPTA 100-25 MCG/INH AEPB Inhale 1 puff by mouth once daily 120 each 0   cetirizine (ZYRTEC ALLERGY) 10 MG tablet 1 tablet as needed     clobetasol cream (TEMOVATE) 1.60 % Apply 1 application topically 2 (two) times daily as needed.      cyclobenzaprine (FLEXERIL) 10 MG tablet Take 10 mg by mouth See admin instructions. Take 10 mg by mouth at bedtime. Take 10 mg by mouth every 8 hours as needed for muscle spasms     D3-50 50000 units capsule Take 50,000 Units by mouth every Saturday.  4   dapagliflozin propanediol (FARXIGA) 5 MG TABS tablet 1 tablet     diclofenac Sodium (VOLTAREN) 1 % GEL APPLY 2 GRAMS TO AFFECTED AREA OF FOOT 2-4 TIMES DAILY 100 g 0   dicyclomine (BENTYL) 10 MG capsule TAKE 1 CAPSULE BY MOUTH 4 TIMES DAILY AS NEEDED FOR SPASMS 120 capsule 0   DULoxetine (CYMBALTA) 60 MG capsule Take 60 mg by mouth daily.     fluticasone (FLONASE) 50 MCG/ACT nasal spray Place 1 spray into both nostrils at bedtime.     furosemide (LASIX) 40 MG tablet Take 40 mg by mouth daily. As needed for SOB or leg swelling     gabapentin (NEURONTIN) 300 MG capsule TAKE 2 CAPSULES BY MOUTH THREE TIMES DAILY 540 capsule 0   GAMUNEX-C 20 GM/200ML SOLN      glucose blood (CONTOUR NEXT TEST) test strip 4 each by Other route 4 (four) times daily.     HYDROcodone-acetaminophen (NORCO) 7.5-325 MG tablet Take 1-2 tablets by mouth every 6 (six) hours as needed for moderate pain (for pain.). 40 tablet 0    immune globulin, human, (GAMMAGARD S/D LESS IGA) 10 g injection Inject 40 g into the vein every 21 ( twenty-one) days. 40 mg daily for 3 days every 3 weeks     Insulin Human (INSULIN PUMP) SOLN Inject into the  skin.     insulin regular human CONCENTRATED (HUMULIN R) 500 UNIT/ML injection Inject 2 Units into the skin See admin instructions. 2.0 units /hour 0500-2359, 1.95units 6734-1937 with carb ratio 1:50 for meal time bolus     ketoconazole (NIZORAL) 2 % cream Apply 1 application topically 2 (two) times daily as needed (for toes).      levothyroxine (EUTHYROX) 137 MCG tablet Take 2 tablets by mouth daily.     levothyroxine (SYNTHROID, LEVOTHROID) 137 MCG tablet Take 274 mcg by mouth daily before breakfast.     lisinopril (ZESTRIL) 20 MG tablet Take 20 mg by mouth daily.     lisinopril (ZESTRIL) 20 MG tablet 1 tablet     metFORMIN (GLUCOPHAGE) 1000 MG tablet 1 tablet with a meal     metFORMIN (GLUCOPHAGE) 500 MG tablet Take by mouth 2 (two) times daily with a meal.     metoprolol succinate (TOPROL-XL) 50 MG 24 hr tablet TAKE 1 TABLET BY MOUTH IN THE MORNING AND 1/2 (ONE-HALF) IN THE EVENING 135 tablet 0   metroNIDAZOLE (METROCREAM) 0.75 % cream Apply 1 application topically 2 (two) times daily.     Multiple Vitamin (MULTIVITAMIN) tablet Take 1 tablet by mouth at bedtime.      mupirocin ointment (BACTROBAN) 2 % APPLY  OINTMENT TOPICALLY TO AFFECTED AREA TWICE DAILY 22 g 0   ondansetron (ZOFRAN-ODT) 8 MG disintegrating tablet Take 8 mg by mouth every 6 (six) hours as needed.     pantoprazole (PROTONIX) 40 MG tablet Take 1 tablet by mouth twice daily 180 tablet 0   PROCTOSOL HC 2.5 % rectal cream Apply 1 application topically 2 (two) times daily as needed for hemorrhoids.   2   silver sulfADIAZINE (SILVADENE) 1 % cream Apply 1 application topically daily. 50 g 0   sodium chloride 0.9 % infusion Inject into the vein.     Spacer/Aero-Holding Chambers (AEROCHAMBER MV) inhaler Use as instructed 1  each 0   Turmeric (QC TUMERIC COMPLEX PO) Take 1 tablet by mouth daily.     No current facility-administered medications for this visit.    Past Surgical History:  Procedure Laterality Date   ABDOMINAL HYSTERECTOMY  ~2012   "lap"   ANKLE RECONSTRUCTION Left 03/29/2016   Procedure: LEFT ANKLE LATERAL LIGAMENT RECONSTRUCTION AND PERONEAL TENDON REPAIR OR TENOLYSIS;  Surgeon: Wylene Simmer, MD;  Location: [place];  Service: Orthopedics;  Laterality: Left;   BACK SURGERY     CARDIAC CATHETERIZATION  04/2009   "clean"   COLONOSCOPY  01/12/2015   Colonic polyps status post polypectomy, small internal hemorrhoids. Otherwise normal. Bx: mild chronic gastritis.   CYSTOSCOPY WITH URETEROSCOPY, STONE BASKETRY AND STENT PLACEMENT  ~ 2006   ESOPHAGOGASTRODUODENOSCOPY  07/21/2015   Esophageal stricture status post esophageal dilatation. Mild gastritis.   insulin pump     U 500   IR IMAGING GUIDED PORT INSERTION  09/22/2018   IRRIGATION AND DEBRIDEMENT KNEE Left 04/03/2018   Procedure: Arthroscopic versus open irrigation and debridement  left knee;  Surgeon: Nicholes Stairs, MD;  Location: [place];  Service: Orthopedics;  Laterality: Left;   LAPAROSCOPIC CHOLECYSTECTOMY  05/2004   LUMBAR [place] SURGERY  08/2011   SURAL NERVE BX Left 09/30/2017   Procedure: LEFT SURAL NERVE AND MUSCLE BIOPSY;  Surgeon: Newman Pies, MD;  Location: [place];  Service: Neurosurgery;  Laterality: Left;  LEFT SURAL NERVE AND MUSCLE BIOPSY   THYROIDECTOMY N/A 02/18/2015   Procedure: TOTAL THYROIDECTOMY;  Surgeon: [name], MD;  [facility];  Service: General;  Laterality: N/A;   TOTAL THYROIDECTOMY  02/18/2015    Family History  Problem Relation Age of Onset   Hypertension Mother    Emphysema Mother    AAA (abdominal aortic aneurysm) Mother    Hypertension Father    Coronary artery disease Father    Cancer - Prostate Father    Renal Disease Father    Hypertension Brother    Colon cancer Neg Hx    Esophageal  cancer Neg Hx    Liver cancer Neg Hx    Pancreatic cancer Neg Hx    Rectal cancer Neg Hx    Stomach cancer Neg Hx     Social History   Tobacco Use   Smoking status: Never   Smokeless tobacco: Never  Vaping Use   Vaping Use: Never used  Substance Use Topics   Alcohol use: No   Drug use: No    Allergies  Allergen Reactions   Penicillins Anaphylaxis, Shortness Of Breath, Swelling, Palpitations and Other (See Comments)    Has patient had a PCN reaction causing immediate rash, facial/tongue/throat swelling, SOB or lightheadedness with hypotension: Yes Has patient had a PCN reaction causing severe rash involving mucus membranes or skin necrosis: No Has patient had a PCN reaction that required hospitalization No Has patient had a PCN reaction occurring within the last 10 years: No If all of the above answers are "NO", then may proceed with Cephalosporin use.    Clarithromycin Nausea And Vomiting and Other (See Comments)    Any antibiotic (doxyclycine, etc)   Other Other (See Comments)    All antibiotics makes patient sick   Adhesive [Tape] Itching and Other (See Comments)    Redness   Latex Itching, Rash and Other (See Comments)    Blisters with prolonged contact   Moxifloxacin Nausea And Vomiting and Other (See Comments)    Needs Zofran or Phenergan     Review of Systems: All systems reviewed and negative except where noted in HPI.    Physical Exam:     BP 118/72   Pulse 96   Ht _0  (1.676 m)   Wt (!) 308 lb 2 oz (139.8 kg)   SpO2 100%   BMI 49.73 kg/m   Filed Weights   09/11/21 0929  Weight: (!) 308 lb 2 oz (139.8 kg)    GENERAL:  Alert, oriented, cooperative, not in acute distress. PSYCH: :Pleasant, normal mood and affect. HEENT:  conjunctiva pink, mucous membranes moist, neck supple without masses. No jaundice. CARDIAC:  S1 S2 normal. No murmers. PULM: Normal respiratory effort, lungs CTA bilaterally, no wheezing. ABDOMEN: Inspection: No visible  peristalsis, no abnormal pulsations, skin normal.  Palpation/percussion: Soft, nontender, nondistended, no rigidity, no abnormal dullness to percussion, no hepatosplenomegaly and no palpable abdominal masses.  Auscultation: Normal bowel sounds, no abdominal bruits. Rectal exam: Deferred SKIN:  turgor, no lesions seen. NEURO: Alert and oriented x 3, no focal neurologic deficits.   Data Reviewed: I have personally reviewed following labs and imaging studies  CBC Latest Ref Rng & Units 09/22/2018 04/03/2018 09/26/2017  WBC 4.0 - 10.5 K/uL 4.5 9.7 5.9  Hemoglobin 12.0 - 15.0 g/dL 14.1 11.7(L) 13.4  Hematocrit 36.0 - 46.0 % 43.1 36.3 41.8  Platelets 150 - 400 K/uL 177 435(H) 193   Hepatic Function Latest Ref Rng & Units 09/26/2017 07/01/2009  Total Protein 6.5 - 8.1 g/dL 6.9 6.0  Albumin 3.5 - 5.0 g/dL 4.1 3.2(L)  AST 15 -  41 U/L 105(H) 47(H)  ALT 14 - 54 U/L 71(H) 32  Alk Phosphatase 38 - 126 U/L 83 39  Total Bilirubin 0.3 - 1.2 mg/dL 0.5 1.1       Brianna Esson,MD 09/11/2021, 9:51 AM   CC Reynold Bowen, MD

## 2021-09-11 NOTE — Patient Instructions (Addendum)
If you are age 54 or older, your body mass index should be between 23-30. Your Body mass index is 49.73 kg/m. If this is out of the aforementioned range listed, please consider follow up with your Primary Care Provider.  If you are age 7 or younger, your body mass index should be between 19-25. Your Body mass index is 49.73 kg/m. If this is out of the aformentioned range listed, please consider follow up with your Primary Care Provider.   __________________________________________________________  The  GI providers would like to encourage you to use [facility] to communicate with providers for non-urgent requests or questions.  Due to long hold times on the telephone, sending your provider a message by [facility] may be a faster and more efficient way to get a response.  Please allow 48 business hours for a response.  Please remember that this is for non-urgent requests.    Your provider has ordered Cologuard testing as an option for colon cancer screening. This is performed by [employer] and may be out of network with your insurance. PRIOR to completing the test, it is YOUR responsibility to contact your insurance about covered benefits for this test. Your out of pocket expense could be anywhere from $0.00 to $649.00.   When you call to check coverage with your insurer, please provide the following information:   -The ONLY provider of Cologuard is [place] code for Cologuard is [phone].  [employer] NPI # [id number]  -[employer] Tax ID # [id number]   We have already sent your demographic and insurance information to [employer] (phone number [phone]) and they should contact you within the next week regarding your test. If you have not heard from them within the next week, please call our office at [phone].   We have sent the following medications to your pharmacy for you to pick up at your convenience: Protonix,  Levsin, Bentyl, Zofran  Thank you,  Dr. Jackquline Denmark

## 2021-09-13 ENCOUNTER — Encounter: Payer: Self-pay | Admitting: Gastroenterology

## 2021-09-21 ENCOUNTER — Ambulatory Visit: Payer: Medicare Other | Admitting: Podiatry

## 2021-09-21 VITALS — Temp 97.9°F

## 2021-09-21 DIAGNOSIS — L97522 Non-pressure chronic ulcer of other part of left foot with fat layer exposed: Secondary | ICD-10-CM | POA: Diagnosis not present

## 2021-09-21 DIAGNOSIS — R2681 Unsteadiness on feet: Secondary | ICD-10-CM

## 2021-09-21 NOTE — Patient Instructions (Signed)
Conitnue with the hydrofera blue daily. If it gets too dry then I would switch to bactine max.

## 2021-09-24 NOTE — Progress Notes (Signed)
Subjective: 55 year old female presents the office today for follow-up evaluation of a wound on the left foot.  She has been changing the bandages Hydrofera Blue.  No drainage or pus.  No swelling or redness of the toe.  She had some chronic swelling and her Lasix are being adjusted.  She denies any fevers or chills.  She has no other concerns.    Objective: AAO x3, NAD DP/PT pulses palpable bilaterally, CRT less than 3 seconds  LEFT: Thick hyperkeratotic tissue is present.  The central aspect of the wound is present is a scab.  Is able to debride the hyperkeratotic tissue but I did not fully remove all the scab tissue today as it is firmly healed.  There is no drainage or pus.  There is no fluctuance or crepitation.  No malodor. There is some plantar flexion noted at the hallux at the MPJ which of these likely contribute to the weight as well as for gait instability. No pain with calf compression, swelling, warmth, erythema bilaterally  Assessment: Left hallux ulceration, chronic  Plan: -All treatment options discussed with the patient including all alternatives, risks, complications. -Sharply debrided the hyperkeratotic tissue today.  Elected to continue with Hydrofera Blue dressing changes.  Also discussed that she can can also use Bactine Max if needed.  Continue offloading.  Continue with bracing.  Return in about 3 weeks (around 10/12/2021).  Trula Slade DPM

## 2021-10-09 ENCOUNTER — Encounter: Payer: Self-pay | Admitting: Podiatry

## 2021-10-11 ENCOUNTER — Ambulatory Visit: Payer: Medicare Other | Admitting: Neurology

## 2021-10-12 ENCOUNTER — Ambulatory Visit: Payer: Medicare Other | Admitting: Podiatry

## 2021-10-12 NOTE — Addendum Note (Signed)
Addended by: Cranford Mon R on: 10/12/2021 08:23 AM   Modules accepted: Orders

## 2021-10-17 ENCOUNTER — Encounter: Payer: Self-pay | Admitting: Podiatry

## 2021-10-20 ENCOUNTER — Other Ambulatory Visit: Payer: Self-pay | Admitting: Podiatry

## 2021-10-20 MED ORDER — SULFAMETHOXAZOLE-TRIMETHOPRIM 800-160 MG PO TABS: 1.0000 | ORAL_TABLET | Freq: Two times a day (BID) | ORAL | 0 refills | Status: DC

## 2021-10-24 ENCOUNTER — Other Ambulatory Visit: Payer: Self-pay | Admitting: Podiatry

## 2021-10-25 ENCOUNTER — Other Ambulatory Visit: Payer: Self-pay | Admitting: Internal Medicine

## 2021-10-27 ENCOUNTER — Other Ambulatory Visit: Payer: Self-pay

## 2021-10-27 ENCOUNTER — Ambulatory Visit (INDEPENDENT_AMBULATORY_CARE_PROVIDER_SITE_OTHER): Payer: Medicare Other

## 2021-10-27 ENCOUNTER — Ambulatory Visit: Payer: Medicare Other | Admitting: Podiatry

## 2021-10-27 VITALS — Temp 98.3°F

## 2021-10-27 DIAGNOSIS — L03032 Cellulitis of left toe: Secondary | ICD-10-CM

## 2021-10-27 DIAGNOSIS — L97522 Non-pressure chronic ulcer of other part of left foot with fat layer exposed: Secondary | ICD-10-CM

## 2021-10-27 DIAGNOSIS — I872 Venous insufficiency (chronic) (peripheral): Secondary | ICD-10-CM | POA: Diagnosis not present

## 2021-10-30 NOTE — Progress Notes (Signed)
Subjective: 55 year old female presents the office today for follow-up evaluation of a wound on the left foot.  She sent a MyChart message last week with increased swelling and redness to her leg.  She has been on the Bactrim and tolerating this well but still concerned about swelling as well as the wound.  She denies any fevers or chills.  She has no other concerns today.    Objective: AAO x3, NAD DP/PT pulses palpable bilaterally, CRT less than 3 seconds  LEFT: Plantar aspect of the toe is hyperkeratotic lesion.  Underlying central superficial granular wound is present.  This appears to be even with the skin and there is no probing, undermining or tunneling.  There is no significant cellulitis or ascending cellulitis of the toe.  There is no fluctuation or crepitation.  There is no malodor.  On the anterior aspect of the leg there is a area of darkened erythema and brown discoloration on the anterior aspect of the leg.  There is no warmth associate with this there is no open sores or any drainage.  I think this is coming more from venous insufficiency and swelling as opposed to a true cellulitis at this time. No pain with calf compression, swelling, warmth, erythema bilaterally  Assessment: Left hallux ulceration, chronic; venous insufficiency  Plan: -All treatment options discussed with the patient including all alternatives, risks, complications. -X-rays obtained reviewed.  No definitive changes suggestive of osteomyelitis. -Sharply debrided the hyperkeratotic tissue today.  The left Ulceration plantar aspect left hallux.  I utilized #342 with scalpel debride the wound and to healthy, granular tissue.  There is no significant blood loss.  She tolerated the procedure well and complications.  Wound was cleansed and a small amount of Silvadene was applied followed by dressing.  Elected to continue with Silvadene dressing changes.  She will finish the course of Bactrim.   -She is going to physical  therapy work on gait training as well.   -Encourage compression, elevation help with the swelling to the legs as well.  The discoloration anterior leg is coming more from this as opposed to true infection.  However still monitor closely signs or symptoms worsening infection let me know immediately should any occur or go to the ER.    Return in about 3 weeks (around 11/17/2021).  Trula Slade DPM

## 2021-11-14 ENCOUNTER — Telehealth: Payer: Self-pay | Admitting: General Surgery

## 2021-11-14 NOTE — Telephone Encounter (Signed)
We received notification from [employer] that the patient has not completed her Cologuard. I contacted the patient and she states that she is waiting on a hemorrhoid to heal and will complete the testing then.

## 2021-11-17 ENCOUNTER — Ambulatory Visit: Payer: Medicare Other | Admitting: Podiatry

## 2021-11-29 ENCOUNTER — Encounter: Payer: Self-pay | Admitting: Podiatry

## 2021-11-29 ENCOUNTER — Encounter: Payer: Self-pay | Admitting: Pulmonary Disease

## 2021-11-29 ENCOUNTER — Encounter: Payer: Self-pay | Admitting: Internal Medicine

## 2021-11-29 ENCOUNTER — Encounter: Payer: Self-pay | Admitting: Gastroenterology

## 2021-11-29 MED ORDER — ALBUTEROL SULFATE HFA 108 (90 BASE) MCG/ACT IN AERS: INHALATION_SPRAY | RESPIRATORY_TRACT | 11 refills | Status: DC

## 2021-11-29 MED ORDER — FLUTICASONE FUROATE-VILANTEROL 100-25 MCG/ACT IN AEPB: INHALATION_SPRAY | RESPIRATORY_TRACT | 5 refills | Status: DC

## 2021-11-29 MED ORDER — AEROCHAMBER MV MISC
0 refills | Status: DC
Start: 2021-11-29 — End: 2023-07-11

## 2021-11-30 ENCOUNTER — Encounter: Payer: Self-pay | Admitting: Internal Medicine

## 2021-11-30 ENCOUNTER — Other Ambulatory Visit: Payer: Self-pay | Admitting: Podiatry

## 2021-11-30 MED ORDER — PANTOPRAZOLE SODIUM 40 MG PO TBEC: 40.0000 mg | DELAYED_RELEASE_TABLET | Freq: Two times a day (BID) | ORAL | 4 refills | Status: DC

## 2021-11-30 MED ORDER — ONDANSETRON 8 MG PO TBDP: 8.0000 mg | ORAL_TABLET | Freq: Four times a day (QID) | ORAL | 3 refills | Status: DC | PRN

## 2021-11-30 MED ORDER — DICYCLOMINE HCL 10 MG PO CAPS: 10.0000 mg | ORAL_CAPSULE | Freq: Three times a day (TID) | ORAL | 10 refills | Status: DC

## 2021-11-30 MED ORDER — METOPROLOL SUCCINATE ER 50 MG PO TB24: 50.0000 mg | ORAL_TABLET | Freq: Every day | ORAL | 2 refills | Status: DC

## 2021-11-30 MED ORDER — SULFAMETHOXAZOLE-TRIMETHOPRIM 800-160 MG PO TABS: 1.0000 | ORAL_TABLET | Freq: Two times a day (BID) | ORAL | 0 refills | Status: DC

## 2021-11-30 MED ORDER — MUPIROCIN 2 % EX OINT: TOPICAL_OINTMENT | CUTANEOUS | 0 refills | Status: DC

## 2021-11-30 MED ORDER — HYOSCYAMINE SULFATE 0.125 MG SL SUBL
0.2500 mg | SUBLINGUAL_TABLET | SUBLINGUAL | 1 refills | Status: DC | PRN
Start: 2021-11-30 — End: 2022-06-25

## 2021-11-30 MED ORDER — DICLOFENAC SODIUM 1 % EX GEL: CUTANEOUS | 0 refills | Status: DC

## 2021-11-30 MED ORDER — METOPROLOL SUCCINATE ER 50 MG PO TB24: ORAL_TABLET | ORAL | 2 refills | Status: DC

## 2021-11-30 MED ORDER — SILVER SULFADIAZINE 1 % EX CREA: 1.0000 "application " | TOPICAL_CREAM | Freq: Every day | CUTANEOUS | 0 refills | Status: DC

## 2021-12-01 ENCOUNTER — Ambulatory Visit (HOSPITAL_COMMUNITY)
Admission: RE | Admit: 2021-12-01 | Discharge: 2021-12-01 | Disposition: A | Discharge status: Home / Self Care | Payer: Medicare Other | Source: Ambulatory Visit | Attending: Cardiovascular Disease | Admitting: Cardiovascular Disease

## 2021-12-01 ENCOUNTER — Ambulatory Visit (INDEPENDENT_AMBULATORY_CARE_PROVIDER_SITE_OTHER): Payer: Medicare Other | Admitting: Podiatry

## 2021-12-01 ENCOUNTER — Other Ambulatory Visit: Payer: Self-pay

## 2021-12-01 ENCOUNTER — Other Ambulatory Visit (HOSPITAL_COMMUNITY): Payer: Self-pay | Admitting: Podiatry

## 2021-12-01 DIAGNOSIS — M7989 Other specified soft tissue disorders: Secondary | ICD-10-CM | POA: Diagnosis not present

## 2021-12-01 DIAGNOSIS — L97522 Non-pressure chronic ulcer of other part of left foot with fat layer exposed: Secondary | ICD-10-CM | POA: Diagnosis not present

## 2021-12-01 DIAGNOSIS — M79662 Pain in left lower leg: Secondary | ICD-10-CM

## 2021-12-01 DIAGNOSIS — R609 Edema, unspecified: Secondary | ICD-10-CM

## 2021-12-01 DIAGNOSIS — R600 Localized edema: Secondary | ICD-10-CM

## 2021-12-05 LAB — COLOGUARD: COLOGUARD: NEGATIVE

## 2021-12-06 NOTE — Progress Notes (Signed)
Subjective: 55 year old female presents the office today for follow-up evaluation of a wound on the left foot.  She also states that she has started Bactrim that she had at home given her left leg became swollen and had some redness.  Denies any open sores in the leg.  She denies any fevers or chills.  No chest pain or shortness of breath.    Objective: AAO x3, NAD DP/PT pulses palpable bilaterally, CRT less than 3 seconds  LEFT: Plantar aspect of the toe is hyperkeratotic lesion.  Upon debridement the wound is almost completely healed only very small superficial opening still opening.  This was not present prior to debridement of the callus.  There is no probing, undermining or tunneling.  There is no erythema or warmth of the toe.  There is some swelling present to the left calf and there is some tenderness with calf compression.  There is no fluctuation or crepitation.  No open lesions otherwise.  No pain with calf compression, swelling, warmth, erythema bilaterally  Assessment: Left hallux ulceration, chronic; venous insufficiency  Plan: -Sharp debrided hyperkeratotic lesion plantar aspect left hallux plan complications to the small superficial opening in the skin.  Personally doing much better almost completely healed.  Continue with daily dressing changes.  Monitor closely for any signs or symptoms of infection.  Given the swelling to the left leg, order venous duplex.  Call to get this scheduled for her today.  I would continue antibiotics and I already refilled Bactrim for her.   -Monitor for any clinical signs or symptoms of infection and directed to call the office immediately should any occur or go to the ER.  *Patient was seen in the office today during a power outage.  Patient still to be seen in.  Unable to get x-rays and exam was limited and she was aware of this.  Trula Slade DPM

## 2022-01-12 ENCOUNTER — Other Ambulatory Visit: Payer: Self-pay | Admitting: Endocrinology

## 2022-01-12 DIAGNOSIS — C73 Malignant neoplasm of thyroid gland: Secondary | ICD-10-CM

## 2022-01-12 DIAGNOSIS — E890 Postprocedural hypothyroidism: Secondary | ICD-10-CM

## 2022-01-15 ENCOUNTER — Ambulatory Visit
Admission: RE | Admit: 2022-01-15 | Discharge: 2022-01-15 | Disposition: A | Discharge status: Home / Self Care | Payer: Medicare Other | Source: Ambulatory Visit | Attending: Endocrinology | Admitting: Endocrinology

## 2022-01-15 DIAGNOSIS — C73 Malignant neoplasm of thyroid gland: Secondary | ICD-10-CM

## 2022-01-15 DIAGNOSIS — E890 Postprocedural hypothyroidism: Secondary | ICD-10-CM

## 2022-01-17 ENCOUNTER — Other Ambulatory Visit: Payer: Self-pay | Admitting: Internal Medicine

## 2022-01-24 ENCOUNTER — Other Ambulatory Visit: Payer: Self-pay | Admitting: Endocrinology

## 2022-01-24 ENCOUNTER — Other Ambulatory Visit: Payer: Self-pay | Admitting: Neurology

## 2022-01-24 DIAGNOSIS — C73 Malignant neoplasm of thyroid gland: Secondary | ICD-10-CM

## 2022-01-29 MED ORDER — GABAPENTIN 300 MG PO CAPS: 600.0000 mg | ORAL_CAPSULE | Freq: Three times a day (TID) | ORAL | 1 refills | Status: AC

## 2022-01-29 NOTE — Addendum Note (Signed)
Addended by: Alda Berthold on: 01/29/2022 10:26 AM   Modules accepted: Orders

## 2022-02-15 ENCOUNTER — Ambulatory Visit
Admission: RE | Admit: 2022-02-15 | Discharge: 2022-02-15 | Disposition: A | Discharge status: Home / Self Care | Payer: Medicare Other | Source: Ambulatory Visit | Attending: Endocrinology | Admitting: Endocrinology

## 2022-02-15 ENCOUNTER — Other Ambulatory Visit (HOSPITAL_COMMUNITY)
Admission: RE | Admit: 2022-02-15 | Discharge: 2022-02-15 | Disposition: A | Discharge status: Home / Self Care | Payer: Medicare Other | Source: Ambulatory Visit | Attending: Interventional Radiology | Admitting: Interventional Radiology

## 2022-02-15 DIAGNOSIS — C73 Malignant neoplasm of thyroid gland: Secondary | ICD-10-CM

## 2022-02-15 DIAGNOSIS — E041 Nontoxic single thyroid nodule: Secondary | ICD-10-CM | POA: Diagnosis present

## 2022-02-19 LAB — CYTOLOGY - NON PAP

## 2022-02-28 ENCOUNTER — Other Ambulatory Visit: Payer: Self-pay

## 2022-02-28 ENCOUNTER — Encounter: Payer: Self-pay | Admitting: Pulmonary Disease

## 2022-02-28 ENCOUNTER — Ambulatory Visit: Payer: Medicare Other | Admitting: Pulmonary Disease

## 2022-02-28 DIAGNOSIS — J4530 Mild persistent asthma, uncomplicated: Secondary | ICD-10-CM

## 2022-02-28 DIAGNOSIS — G6181 Chronic inflammatory demyelinating polyneuritis: Secondary | ICD-10-CM

## 2022-02-28 DIAGNOSIS — G4733 Obstructive sleep apnea (adult) (pediatric): Secondary | ICD-10-CM

## 2022-02-28 NOTE — Assessment & Plan Note (Signed)
Continue Breo. ?Use albuterol as needed ?

## 2022-02-28 NOTE — Patient Instructions (Signed)
?  Okay to trial pool activity. ? ?Continue on Breo ? ?Try cloth liner for mask straps ?

## 2022-02-28 NOTE — Progress Notes (Signed)
? ?  Subjective:  ? ? Patient ID: [name], female    DOB: Jul 16, 1966, 56 y.o.   MRN: [id number] ? ?HPI ? 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  ?CIDP was diagnosed when she presented with foot drop and loss of sensation BL legs and paresthesias-  IVIG started in February 2019 and on IVIG 1g/kg every 3 weeks via port-a-cath.      ?PMH -CIDP as above, dysautonomia Caryl Comes) , orthostatic hypotension with syncope and multiple falls, proteinuria/CKD Hollie Salk ) , hypertension, thyroid cancer status post thyroidectomy , fibromyalgia , IDDM on insulin pump ? ?Chief Complaint  ?Patient presents with  ? Follow-up  ?  Osa follow up. Pt states the cpap machine bothers her some nights. She states sometimes she feels like its suffocating her.   ? ?Arrives accompanied by her friend who corroborates history. ?Uses walker, weight is unchanged. ?[facility]  Admits that she will have trouble getting to the pool but has assistance. ?Straps have created welts over her face, no problems with mask.  Occasionally feels like CPAP is suffocating her. ?Uses Breo mostly in the evenings because that is when her breathing is worse ? ? ? ? ?Significant tests/ events reviewed ?HST 01/2021 mild OSA, AHI 7/h ?11/2020 Sniff test decreased excursion of the right hemidiaphragm  ?PFT 12/2020 moderate reversible obstruction, ratio 85, FEV1 57%, FVC 62%, significant bronchodilator response improved by 0.54 to 81%, TLC 80%, DLCO 108% ?  ?Chest x-ray 11/18/2020 independently reviewed shows low volumes elevated right hemidiaphragm. ?Chest x-ray 02/2015 also shows lower volumes and similar mild elevation of right hemidiaphragm. ? ? ?Review of Systems ?neg for any significant sore throat, dysphagia, itching, sneezing, nasal congestion or excess/ purulent secretions, fever, chills, sweats, unintended wt loss, pleuritic or exertional cp, hempoptysis,  orthopnea pnd or change in chronic leg swelling. Also denies presyncope, palpitations, heartburn, abdominal pain, nausea, vomiting, diarrhea or change in bowel or urinary habits, dysuria,hematuria, rash, arthralgias, visual complaints, headache, numbness weakness or ataxia. ? ?   ?Objective:  ? Physical Exam ? ?Gen. Pleasant, obese, in no distress ?ENT - no lesions, no post nasal drip ?Neck: No JVD, no thyromegaly, no carotid bruits ?Lungs: no use of accessory muscles, no dullness to percussion, decreased without rales or rhonchi  ?Cardiovascular: Rhythm regular, heart sounds  normal, no murmurs or gallops, no peripheral edema ?Musculoskeletal: No deformities, no cyanosis or clubbing , no tremors ? ? ? ?   ?Assessment & Plan:  ? ? ?

## 2022-02-28 NOTE — Assessment & Plan Note (Signed)
She has entered into a vicious cycle of inactivity, deconditioning and weight gain. ?[facility]  I am concerned about her inability to get in and out of the pool but she has assistance to do this she also has an electrical a chair which she can use.  I filled out clearance form ?

## 2022-02-28 NOTE — Assessment & Plan Note (Signed)
CPAP download was reviewed which shows good control of events on auto settings with average pressure of 10 cm.  Compliance is sporadic but average is more than 4.5 hours per night.  Some nights she takes the machine off due to suffocation. ?We discussed using a cloth liner for strap issues. ? ?Weight loss encouraged, compliance with goal of at least 4-6 hrs every night is the expectation. ?Advised against medications with sedative side effects ?Cautioned against driving when sleepy - understanding that sleepiness will vary on a day to day basis ? ?

## 2022-03-02 ENCOUNTER — Encounter (HOSPITAL_COMMUNITY): Payer: Self-pay

## 2022-03-02 DIAGNOSIS — M79676 Pain in unspecified toe(s): Secondary | ICD-10-CM

## 2022-03-14 NOTE — Progress Notes (Signed)
? ?PCP:  Reynold Bowen, MD ?Primary Cardiologist: None ?Electrophysiologist: Virl Axe, MD  ? ?Betsaida KEIERA STRATHMAN is a 56 y.o. female seen today for Virl Axe, MD for routine electrophysiology followup.  Since last being seen in our clinic the patient reports doing about the same, which is poorly. Just "maintaining" with no will relief despite multiple med adjustments. She states today she is "fully compressed" wearing ted hose, a tight cami, and high waisted compression garment. She does note a modest improvement with these being used at the same time.  ? ?Neurology has been managing hypertension, and as previous, systolic hypertension has precluded sodium intake liberalization for volume support.   ? ?Strategies for managing her symptoms have included ?1-abdominal binder (though associated with skin erosion) ?2-fluids ?3-beta-blockers improving exercise tolerance ? ?She continues to have frequent falls and occasional syncope. Most recently was sitting at kitchen table helping prep meals. Stood up and took ingredients to her husband at the sink, and then syncopized while walking away from the sink.  She drinks approximately 100-110 oz of water daily. Not liberalizing salt due to hypertension.  ? ?Past Medical History:  ?Diagnosis Date  ? Anginal pain ([place])   ? chest pain  heart cath done  ? Anxiety   ? Cancer [facility])   ? thyroid cancer  ? Chronic bronchitis ([place])   ? "get it q yr"  ? Chronic inflammatory demyelinating polyneuropathy ([facility])   ? Chronic kidney disease   ? Proteinuria  ? Dyspnea   ? sob with exertion  ? Early cataract   ? right  ? Fibromyalgia   ? GERD (gastroesophageal reflux disease)   ? Heart murmur   ? Hepatic adenoma   ? High cholesterol   ? History of gout   ? History of hiatal hernia   ? History of kidney stones   ? Hypertension   ? Hypothyroidism   ? IBS (irritable bowel syndrome)   ? Iron deficiency anemia   ? "comes and goes"  ? Migraine   ? "@ least once/month" (02/18/2015)  ? Obesity   ?  PONV (postoperative nausea and vomiting)   ? patch and anti nausea meds work well  ? Proteinuria   ? Spinal headache   ? Syncope   ? Tachycardia   ? Type II diabetes mellitus ([place])   ? pt has an insulin pump  ? Wears glasses   ? ?Past Surgical History:  ?Procedure Laterality Date  ? ABDOMINAL HYSTERECTOMY  ~2012  ? "lap"  ? ANKLE RECONSTRUCTION Left 03/29/2016  ? Procedure: LEFT ANKLE LATERAL LIGAMENT RECONSTRUCTION AND PERONEAL TENDON REPAIR OR TENOLYSIS;  Surgeon: Wylene Simmer, MD;  Location: [place];  Service: Orthopedics;  Laterality: Left;  ? BACK SURGERY    ? CARDIAC CATHETERIZATION  04/2009  ? "clean"  ? COLONOSCOPY  01/12/2015  ? Colonic polyps status post polypectomy, small internal hemorrhoids. Otherwise normal. Bx: mild chronic gastritis.  ? CYSTOSCOPY WITH URETEROSCOPY, STONE BASKETRY AND STENT PLACEMENT  ~ 2006  ? ESOPHAGOGASTRODUODENOSCOPY  07/21/2015  ? Esophageal stricture status post esophageal dilatation. Mild gastritis.  ? insulin pump    ? U 500  ? IR IMAGING GUIDED PORT INSERTION  09/22/2018  ? IRRIGATION AND DEBRIDEMENT KNEE Left 04/03/2018  ? Procedure: Arthroscopic versus open irrigation and debridement  left knee;  Surgeon: Nicholes Stairs, MD;  Location: [place];  Service: Orthopedics;  Laterality: Left;  ? LAPAROSCOPIC CHOLECYSTECTOMY  05/2004  ? [place] SURGERY  08/2011  ? SURAL  NERVE BX Left 09/30/2017  ? Procedure: LEFT SURAL NERVE AND MUSCLE BIOPSY;  Surgeon: Newman Pies, MD;  Location: [place];  Service: Neurosurgery;  Laterality: Left;  LEFT SURAL NERVE AND MUSCLE BIOPSY  ? THYROIDECTOMY N/A 02/18/2015  ? Procedure: TOTAL THYROIDECTOMY;  Surgeon: [name], MD;  Location: [place];  Service: General;  Laterality: N/A;  ? TOTAL THYROIDECTOMY  02/18/2015  ? ? ?Current Outpatient Medications  ?Medication Sig Dispense Refill  ? acetaminophen (TYLENOL) 500 MG tablet Take 1,000 mg by mouth every 6 (six) hours as needed for moderate pain or headache.     ? albuterol (VENTOLIN HFA) 108 (90  Base) MCG/ACT inhaler 2 puffs. 8 g 11  ? ALPRAZolam (XANAX) 0.5 MG tablet Take 1 tablet by mouth every 6 (six) hours as needed.    ? APPLE CIDER VINEGAR PO Take 1 each by mouth daily. One gummy once a day    ? atorvastatin (LIPITOR) 20 MG tablet Take 1 tablet by mouth daily.    ? Azelastine HCl 137 MCG/SPRAY SOLN daily.    ? b complex vitamins tablet Take 1 tablet by mouth daily.    ? Bayer Microlet Lancets lancets 4 each by Other route 4 (four) times daily.    ? BINAXNOW COVID-19 AG HOME TEST KIT Use as Directed on the Package    ? Black Pepper-Turmeric (TURMERIC CURCUMIN) 04-999 MG CAPS See admin instructions. At bedtime    ? cetirizine (ZYRTEC) 10 MG tablet 1 tablet as needed    ? clobetasol cream (TEMOVATE) 6.30 % Apply 1 application topically 2 (two) times daily as needed.     ? cyclobenzaprine (FLEXERIL) 10 MG tablet Take 10 mg by mouth See admin instructions. Take 10 mg by mouth at bedtime. Take 10 mg by mouth every 8 hours as needed for muscle spasms    ? D3-50 50000 units capsule Take 50,000 Units by mouth every Saturday.  4  ? dapagliflozin propanediol (FARXIGA) 5 MG TABS tablet 1 tablet    ? diclofenac Sodium (VOLTAREN) 1 % GEL APPLY 2 GRAMS TO AFFECTED AREA OF FOOT 2-4 TIMES DAILY 100 g 0  ? dicyclomine (BENTYL) 10 MG capsule Take 1 capsule (10 mg total) by mouth 4 (four) times daily -  before meals and at bedtime. 120 capsule 10  ? doxycycline (PERIOSTAT) 20 MG tablet Take 20 mg by mouth 2 (two) times daily.   3  ? DULoxetine (CYMBALTA) 60 MG capsule Take 60 mg by mouth daily.    ? fluticasone (FLONASE) 50 MCG/ACT nasal spray Place 1 spray into both nostrils at bedtime.    ? fluticasone furoate-vilanterol (BREO ELLIPTA) 100-25 MCG/ACT AEPB Inhale 1 puff by mouth once daily 60 each 5  ? furosemide (LASIX) 40 MG tablet Take 40 mg by mouth daily. As needed for SOB or leg swelling    ? gabapentin (NEURONTIN) 300 MG capsule Take 2 capsules (600 mg total) by mouth 3 (three) times daily. 540 capsule 1  ?  GAMUNEX-C 20 GM/200ML SOLN     ? glucose blood (CONTOUR NEXT TEST) test strip 4 each by Other route 4 (four) times daily.    ? HYDROcodone-acetaminophen (NORCO) 7.5-325 MG tablet Take 1-2 tablets by mouth every 6 (six) hours as needed for moderate pain (for pain.). 40 tablet 0  ? hyoscyamine (LEVSIN SL) 0.125 MG SL tablet Take 2 tablets (0.25 mg total) by mouth every 4 (four) hours as needed. Take 2 with every meals and as needed 180 tablet 1  ?  Insulin Human (INSULIN PUMP) SOLN Inject into the skin.    ? insulin regular human CONCENTRATED (HUMULIN R) 500 UNIT/ML injection Inject 2 Units into the skin See admin instructions. 2.0 units /hour 0500-2359, 1.95units 5806-3868 with carb ratio 1:50 for meal time bolus    ? ketoconazole (NIZORAL) 2 % cream Apply 1 application topically 2 (two) times daily as needed (for toes).     ? levothyroxine (SYNTHROID) 137 MCG tablet Take 2 tablets by mouth daily.    ? lisinopril (ZESTRIL) 20 MG tablet Take 20 mg by mouth daily.    ? metFORMIN (GLUCOPHAGE) 1000 MG tablet 1 tablet with a meal    ? metoprolol succinate (TOPROL-XL) 50 MG 24 hr tablet Take 1 tablet (50 mg total) by mouth at bedtime. 90 tablet 1  ? Multiple Vitamin (MULTIVITAMIN) tablet Take 1 tablet by mouth at bedtime.     ? mupirocin ointment (BACTROBAN) 2 % APPLY  OINTMENT TOPICALLY TO AFFECTED AREA TWICE DAILY 22 g 0  ? ondansetron (ZOFRAN-ODT) 8 MG disintegrating tablet Take 1 tablet (8 mg total) by mouth every 6 (six) hours as needed. 30 tablet 3  ? pantoprazole (PROTONIX) 40 MG tablet Take 1 tablet (40 mg total) by mouth 2 (two) times daily. 180 tablet 4  ? PROCTOSOL HC 2.5 % rectal cream Apply 1 application topically 2 (two) times daily as needed for hemorrhoids.   2  ? silver sulfADIAZINE (SILVADENE) 1 % cream Apply 1 application topically daily. 50 g 0  ? sodium chloride 0.9 % infusion Inject into the vein.    ? Spacer/Aero-Holding Chambers (AEROCHAMBER MV) inhaler Use as instructed 1 each 0  ? UBRELVY 100 MG  TABS Take 1 tablet by mouth daily as needed.    ? ?No current facility-administered medications for this visit.  ? ? ?Allergies  ?Allergen Reactions  ? Penicillins Anaphylaxis, Shortness Of Breath, Swelling,

## 2022-03-20 ENCOUNTER — Ambulatory Visit (INDEPENDENT_AMBULATORY_CARE_PROVIDER_SITE_OTHER): Payer: Medicare Other | Admitting: Student

## 2022-03-20 ENCOUNTER — Encounter: Payer: Self-pay | Admitting: Student

## 2022-03-20 VITALS — BP 100/76 | HR 92 | Ht 66.0 in | Wt 304.4 lb

## 2022-03-20 DIAGNOSIS — E119 Type 2 diabetes mellitus without complications: Secondary | ICD-10-CM | POA: Diagnosis not present

## 2022-03-20 DIAGNOSIS — R55 Syncope and collapse: Secondary | ICD-10-CM

## 2022-03-20 DIAGNOSIS — G6181 Chronic inflammatory demyelinating polyneuritis: Secondary | ICD-10-CM | POA: Diagnosis not present

## 2022-03-20 DIAGNOSIS — I951 Orthostatic hypotension: Secondary | ICD-10-CM

## 2022-03-20 DIAGNOSIS — I10 Essential (primary) hypertension: Secondary | ICD-10-CM | POA: Diagnosis not present

## 2022-03-20 DIAGNOSIS — Z6841 Body Mass Index (BMI) 40.0 and over, adult: Secondary | ICD-10-CM

## 2022-03-20 NOTE — Patient Instructions (Signed)
Medication Instructions:  ?Your physician recommends that you continue on your current medications as directed. Please refer to the Current Medication list given to you today. ? ?*If you need a refill on your cardiac medications before your next appointment, please call your pharmacy* ? ? ?Lab Work: ?None ?If you have labs (blood work) drawn today and your tests are completely normal, you will receive your results only by: ?MyChart Message (if you have MyChart) OR ?A paper copy in the mail ?If you have any lab test that is abnormal or we need to change your treatment, we will call you to review the results. ? ? ?Follow-Up: ?At [facility], you and your health needs are our priority.  As part of our continuing mission to provide you with exceptional heart care, we have created designated Provider Care Teams.  These Care Teams include your primary Cardiologist (physician) and Advanced Practice Providers (APPs -  Physician Assistants and Nurse Practitioners) who all work together to provide you with the care you need, when you need it. ? ?We recommend signing up for the patient portal called "MyChart".  Sign up information is provided on this After Visit Summary.  MyChart is used to connect with patients for Virtual Visits (Telemedicine).  Patients are able to view lab/test results, encounter notes, upcoming appointments, etc.  Non-urgent messages can be sent to your provider as well.   ?To learn more about what you can do with MyChart, go to [email or web address].   ? ?Your next appointment:   ?6 month(s) ? ?The format for your next appointment:   ?In Person ? ?Provider:   ?Virl Axe, MD{ ? ? ?Important Information About Sugar ? ? ? ? ?  ?

## 2022-04-06 ENCOUNTER — Ambulatory Visit: Payer: Medicare Other | Admitting: Primary Care

## 2022-04-06 ENCOUNTER — Encounter: Payer: Self-pay | Admitting: Primary Care

## 2022-04-06 VITALS — BP 120/74 | HR 120 | Temp 97.8°F | Ht 66.0 in | Wt 305.0 lb

## 2022-04-06 DIAGNOSIS — G4733 Obstructive sleep apnea (adult) (pediatric): Secondary | ICD-10-CM

## 2022-04-06 DIAGNOSIS — R0602 Shortness of breath: Secondary | ICD-10-CM | POA: Diagnosis not present

## 2022-04-06 DIAGNOSIS — J4530 Mild persistent asthma, uncomplicated: Secondary | ICD-10-CM | POA: Diagnosis not present

## 2022-04-06 LAB — BRAIN NATRIURETIC PEPTIDE: Pro B Natriuretic peptide (BNP): 10 pg/mL (ref 0.0–100.0)

## 2022-04-06 MED ORDER — FLUTICASONE FUROATE-VILANTEROL 200-25 MCG/ACT IN AEPB: 1.0000 | INHALATION_SPRAY | Freq: Every day | RESPIRATORY_TRACT | 2 refills | Status: DC

## 2022-04-06 NOTE — Progress Notes (Signed)
'@Patient'  ID: [name], female    DOB: 07-14-1966, 56 y.o.   MRN: [id number]  Chief Complaint  Patient presents with   Follow-up    She feels that her heart is beating out her chest. She feels very winded after walking long distance.     Referring provider: Reynold Bowen, MD  HPI: 56 year old female, never smoked. PMH significant for OSA, asthma, HTN, diaphragm dysfunction, GERD, type 2 diabetes, CIDP, diabetes, hyperlipidemia, obesity.  04/06/2022- interim hx  Patient presents today for acute visit. Patient reports shortness of breath and racing heart rate. She gets exteremly short of breath with exertion. No associated upper respiratory symptoms. She is compliant with BREO Ellipta. Lowest O2 saturation today was 92% today on room air. PFT in January 2022 showed mild restriction. HST in July 2009 showed mild obstructive sleep apnea, AHI 6/hr. She is on CPAP. She gets IVIG every 21 days.   Airview download 03/06/22-04/04/22 22/30 days (73%); 13 days (43%) > 4 hours Average usage 3 hours 26 mins Pressure 5-15cm h20 (9cm h20-95) Airleaks 33.5L/min (95%) AHI 1.8    Allergies  Allergen Reactions   Penicillins Anaphylaxis, Shortness Of Breath, Swelling, Palpitations and Other (See Comments)    Has patient had a PCN reaction causing immediate rash, facial/tongue/throat swelling, SOB or lightheadedness with hypotension: Yes Has patient had a PCN reaction causing severe rash involving mucus membranes or skin necrosis: No Has patient had a PCN reaction that required hospitalization No Has patient had a PCN reaction occurring within the last 10 years: No If all of the above answers are "NO", then may proceed with Cephalosporin use.    Clarithromycin Nausea And Vomiting and Other (See Comments)    Any antibiotic (doxyclycine, etc)   Adhesive [Tape] Itching and Other (See Comments)    Redness   Latex Itching, Rash and Other (See Comments)    Blisters with prolonged contact    Moxifloxacin Nausea And Vomiting and Other (See Comments)    Needs Zofran or Phenergan   Other Other (See Comments)    All antibiotics makes patient sick    Immunization History  Administered Date(s) Administered   Influenza, Quadrivalent, Recombinant, Inj, Pf 08/16/2018, 11/15/2020, 08/31/2021   Influenza-Unspecified 09/09/2014, 09/12/2017, 08/16/2018   PFIZER(Purple Top)SARS-COV-2 Vaccination 02/26/2020, 03/23/2020, 08/05/2020, 11/20/2021   Pneumococcal Polysaccharide-23 08/08/2021, 08/31/2021   Tdap 08/08/2021   Zoster Recombinat (Shingrix) 08/16/2018, 11/24/2018   Zoster, Live 08/16/2018    Past Medical History:  Diagnosis Date   Anginal pain ([place])    chest pain  heart cath done   Anxiety    Cancer [facility])    thyroid cancer   Chronic bronchitis ([place])    "get it q yr"   Chronic inflammatory demyelinating polyneuropathy ([facility])    Chronic kidney disease    Proteinuria   Dyspnea    sob with exertion   Early cataract    right   Fibromyalgia    GERD (gastroesophageal reflux disease)    Heart murmur    Hepatic adenoma    High cholesterol    History of gout    History of hiatal hernia    History of kidney stones    Hypertension    Hypothyroidism    IBS (irritable bowel syndrome)    Iron deficiency anemia    "comes and goes"   Migraine    "@ least once/month" (02/18/2015)   Obesity    PONV (postoperative nausea and vomiting)    patch and anti nausea meds work well  Proteinuria    Spinal headache    Syncope    Tachycardia    Type II diabetes mellitus (HCC)    pt has an insulin pump   Wears glasses     Tobacco History: Social History   Tobacco Use  Smoking Status Never  Smokeless Tobacco Never   Counseling given: Not Answered   Outpatient Medications Prior to Visit  Medication Sig Dispense Refill   acetaminophen (TYLENOL) 500 MG tablet Take 1,000 mg by mouth every 6 (six) hours as needed for moderate pain or headache.      albuterol (VENTOLIN HFA) 108  (90 Base) MCG/ACT inhaler 2 puffs. 8 g 11   ALPRAZolam (XANAX) 0.5 MG tablet Take 1 tablet by mouth every 6 (six) hours as needed.     APPLE CIDER VINEGAR PO Take 1 each by mouth daily. One gummy once a day     Atogepant 60 MG TABS Take 1 tablet by mouth as needed.     atorvastatin (LIPITOR) 20 MG tablet Take 1 tablet by mouth daily.     Azelastine HCl 137 MCG/SPRAY SOLN daily.     b complex vitamins tablet Take 1 tablet by mouth daily.     Bayer Microlet Lancets lancets 4 each by Other route 4 (four) times daily.     BINAXNOW COVID-19 AG HOME TEST KIT Use as Directed on the Package     Black Pepper-Turmeric (TURMERIC CURCUMIN) 04-999 MG CAPS See admin instructions. At bedtime     cetirizine (ZYRTEC) 10 MG tablet 1 tablet as needed     clobetasol cream (TEMOVATE) 7.98 % Apply 1 application topically 2 (two) times daily as needed.      cyclobenzaprine (FLEXERIL) 10 MG tablet Take 10 mg by mouth See admin instructions. Take 10 mg by mouth at bedtime. Take 10 mg by mouth every 8 hours as needed for muscle spasms     D3-50 50000 units capsule Take 50,000 Units by mouth every Saturday.  4   dapagliflozin propanediol (FARXIGA) 5 MG TABS tablet 1 tablet     dicyclomine (BENTYL) 10 MG capsule Take 1 capsule (10 mg total) by mouth 4 (four) times daily -  before meals and at bedtime. 120 capsule 10   doxycycline (PERIOSTAT) 20 MG tablet Take 20 mg by mouth 2 (two) times daily.   3   DULoxetine (CYMBALTA) 60 MG capsule Take 60 mg by mouth daily.     fluticasone (FLONASE) 50 MCG/ACT nasal spray Place 1 spray into both nostrils at bedtime.     furosemide (LASIX) 40 MG tablet Take 40 mg by mouth daily. As needed for SOB or leg swelling     gabapentin (NEURONTIN) 300 MG capsule Take 2 capsules (600 mg total) by mouth 3 (three) times daily. 540 capsule 1   GAMUNEX-C 20 GM/200ML SOLN      glucose blood (CONTOUR NEXT TEST) test strip 4 each by Other route 4 (four) times daily.     HYDROcodone-acetaminophen  (NORCO) 7.5-325 MG tablet Take 1-2 tablets by mouth every 6 (six) hours as needed for moderate pain (for pain.). 40 tablet 0   hyoscyamine (LEVSIN SL) 0.125 MG SL tablet Take 2 tablets (0.25 mg total) by mouth every 4 (four) hours as needed. Take 2 with every meals and as needed 180 tablet 1   Insulin Human (INSULIN PUMP) SOLN Inject into the skin.     insulin regular human CONCENTRATED (HUMULIN R) 500 UNIT/ML injection Inject 2 Units into the skin See  admin instructions. 2.0 units /hour 0500-2359, 1.95units 4627-0350 with carb ratio 1:50 for meal time bolus     ketoconazole (NIZORAL) 2 % cream Apply 1 application topically 2 (two) times daily as needed (for toes).      levothyroxine (SYNTHROID) 137 MCG tablet Take 2 tablets by mouth daily.     lisinopril (ZESTRIL) 20 MG tablet Take 20 mg by mouth daily.     metFORMIN (GLUCOPHAGE) 1000 MG tablet 1 tablet with a meal     metoprolol succinate (TOPROL-XL) 50 MG 24 hr tablet Take 1 tablet (50 mg total) by mouth at bedtime. 90 tablet 1   Multiple Vitamin (MULTIVITAMIN) tablet Take 1 tablet by mouth at bedtime.      mupirocin ointment (BACTROBAN) 2 % APPLY  OINTMENT TOPICALLY TO AFFECTED AREA TWICE DAILY 22 g 0   ondansetron (ZOFRAN-ODT) 8 MG disintegrating tablet Take 1 tablet (8 mg total) by mouth every 6 (six) hours as needed. 30 tablet 3   pantoprazole (PROTONIX) 40 MG tablet Take 1 tablet (40 mg total) by mouth 2 (two) times daily. 180 tablet 4   PROCTOSOL HC 2.5 % rectal cream Apply 1 application topically 2 (two) times daily as needed for hemorrhoids.   2   silver sulfADIAZINE (SILVADENE) 1 % cream Apply 1 application topically daily. 50 g 0   sodium chloride 0.9 % infusion Inject into the vein.     Spacer/Aero-Holding Chambers (AEROCHAMBER MV) inhaler Use as instructed 1 each 0   Ubrogepant (UBRELVY) 100 MG TABS Take 1 tablet by mouth.     diclofenac Sodium (VOLTAREN) 1 % GEL APPLY 2 GRAMS TO AFFECTED AREA OF FOOT 2-4 TIMES DAILY 100 g 0    fluticasone furoate-vilanterol (BREO ELLIPTA) 100-25 MCG/ACT AEPB Inhale 1 puff by mouth once daily 60 each 5   UBRELVY 100 MG TABS Take 1 tablet by mouth daily as needed. (Patient not taking: Reported on 04/06/2022)     No facility-administered medications prior to visit.    Review of Systems  Review of Systems  Constitutional:  Positive for fatigue.  HENT: Negative.    Respiratory:  Positive for shortness of breath and wheezing.   Cardiovascular: Negative.     Physical Exam  BP 120/74 (BP Location: Left Arm, Cuff Size: Large)   Pulse (!) 120   Temp 97.8 F (36.6 C) (Oral)   Ht '5\' 6"'  (1.676 m)   Wt (!) 305 lb (138.3 kg)   SpO2 98%   BMI 49.23 kg/m  Physical Exam Constitutional:      Appearance: Normal appearance. She is obese.  HENT:     Head: Normocephalic and atraumatic.  Cardiovascular:     Rate and Rhythm: Normal rate and regular rhythm.  Pulmonary:     Effort: Pulmonary effort is normal.     Breath sounds: Normal breath sounds.     Comments: CTA Neurological:     General: No focal deficit present.     Mental Status: She is alert and oriented to person, place, and time. Mental status is at baseline.  Psychiatric:        Mood and Affect: Mood normal.        Behavior: Behavior normal.        Thought Content: Thought content normal.        Judgment: Judgment normal.     Lab Results:  CBC    Component Value Date/Time   WBC 4.5 09/22/2018 0737   RBC 4.82 09/22/2018 0737   HGB 14.1  09/22/2018 0737   HCT 43.1 09/22/2018 0737   PLT 177 09/22/2018 0737   MCV 89.4 09/22/2018 0737   MCH 29.3 09/22/2018 0737   MCHC 32.7 09/22/2018 0737   RDW 14.1 09/22/2018 0737   LYMPHSABS 2.9 12/24/2012 1904   MONOABS 0.7 12/24/2012 1904   EOSABS 0.1 12/24/2012 1904   BASOSABS 0.0 12/24/2012 1904    BMET    Component Value Date/Time   NA 135 04/03/2018 1013   K 4.1 04/03/2018 1013   CL 98 (L) 04/03/2018 1013   CO2 24 04/03/2018 1013   GLUCOSE 73 04/03/2018 1013    BUN 11 04/03/2018 1013   CREATININE 1.00 04/11/2022 1629   CALCIUM 9.0 04/03/2018 1013   GFRNONAA >60 04/03/2018 1013   GFRAA >60 04/03/2018 1013    BNP No results found for: BNP  ProBNP    Component Value Date/Time   PROBNP 10.0 04/06/2022 1025    Imaging: CT Angio Chest W/Cm &/Or Wo Cm  Result Date: 04/11/2022 CLINICAL DATA:  Chest pain or shortness of breath. Thyroid cancer with elevated thyroglobulin level, biopsy of thyroidectomy bed 02/15/2022 revealed atypia of undetermined significance (Bethesda category III). EXAM: CT ANGIOGRAPHY CHEST WITH CONTRAST TECHNIQUE: Multidetector CT imaging of the chest was performed using the standard protocol during bolus administration of intravenous contrast. Multiplanar CT image reconstructions and MIPs were obtained to evaluate the vascular anatomy. RADIATION DOSE REDUCTION: This exam was performed according to the departmental dose-optimization program which includes automated exposure control, adjustment of the mA and/or kV according to patient size and/or use of iterative reconstruction technique. CONTRAST:  39m OMNIPAQUE IOHEXOL 350 MG/ML SOLN COMPARISON:  Chest CT 07/02/2009 FINDINGS: Despite efforts by the technologist and patient, motion artifact is present on today's exam and could not be eliminated. This reduces exam sensitivity and specificity. Cardiovascular: No filling defect is identified in the pulmonary arterial tree to suggest pulmonary embolus. Right Port-A-Cath tip: SVC. Mild cardiomegaly. Mediastinum/Nodes: Thyroidectomy. No pathologic thoracic adenopathy. Lungs/Pleura: Right lower lobe scarring unchanged from 2010. The lungs appear otherwise clear. Upper Abdomen: Nodular morphology of the periphery of parts of the liver raising suspicion for possible cirrhosis for example on image 94 series 4. Cholecystectomy. Mildly prominent portacaval node at 1.6 cm in short axis on image 96 series 4, previously 1.2 cm 12/06/2016, nonspecific but  probably reactive. Musculoskeletal: Thoracic spondylosis with multilevel bridging spurring. Review of the MIP images confirms the above findings. IMPRESSION: 1. No filling defect is identified in the pulmonary arterial tree to suggest pulmonary embolus. 2. Nodular hepatic contour suspicious for cirrhosis. Mildly prominent portacaval lymph node is probably reactive given the liver findings. 3. Mild cardiomegaly. 4. Thoracic spondylosis. Electronically Signed   By: WVan ClinesM.D.   On: 04/11/2022 16:54     Assessment & Plan:   Asthma in adult, mild persistent, uncomplicated - Increased dyspnea with exertion and tachycardia. Associated wheezing. Asthma does not appear acutely exacerbated today. Plan increased Breo Ellipta 2054m one puff daily. She did not need supplemental oxygen on simple walk test today. Checking D-dimer, BNP and 6 MWT.   OSA (obstructive sleep apnea) - Patient is 43% compliant with CPAP > 4 hours. Pressure 5-15cm h20 with AHI 1.8/hr. Encourage patient be more consisent with CPAP use.    ElMartyn EhrichNP 05/07/2022

## 2022-04-06 NOTE — Patient Instructions (Addendum)
Recommendations: ?- Start Breo 200- take 1 puff daily ? ?Orders: ?- D-dimer and BNP today (ordered) ?- 6 minute walk test (please order/schedule) ? ?Follow-up: ?- 6 month recall should already be in with Dr. Elsworth Soho (call sooner if needed) ? ?

## 2022-04-07 LAB — D-DIMER, QUANTITATIVE: D-Dimer, Quant: 0.6 mcg/mL FEU — ABNORMAL HIGH (ref <0.50)

## 2022-04-09 ENCOUNTER — Telehealth: Payer: Self-pay | Admitting: Pulmonary Disease

## 2022-04-09 DIAGNOSIS — R079 Chest pain, unspecified: Secondary | ICD-10-CM

## 2022-04-09 DIAGNOSIS — R0602 Shortness of breath: Secondary | ICD-10-CM

## 2022-04-09 NOTE — Progress Notes (Signed)
BNP was normal. Ddimer was elevated, needs stat CTA to rule out PE. Please order. Thanks

## 2022-04-10 ENCOUNTER — Other Ambulatory Visit: Payer: Self-pay | Admitting: Primary Care

## 2022-04-10 DIAGNOSIS — R0602 Shortness of breath: Secondary | ICD-10-CM

## 2022-04-10 NOTE — Telephone Encounter (Signed)
[name], NP  ?04/09/2022 12:05 PM EDT   ?  ?BNP was normal. Ddimer was elevated, needs stat CTA to rule out PE. Please order. Thanks   ? ? ?I tried calling this pt back and there was no answer- LMTCB and will route back as urgent since needs CTA and I have pended the order until pt is agreeable.  ?

## 2022-04-10 NOTE — Telephone Encounter (Signed)
I spoke with the pt and notified of results/recs  ?She verbalized understanding  ?She was agreeable to CTA  ?I have ordered this and rescheduled her 6MW  ?

## 2022-04-11 ENCOUNTER — Ambulatory Visit (HOSPITAL_COMMUNITY)
Admission: RE | Admit: 2022-04-11 | Discharge: 2022-04-11 | Disposition: A | Discharge status: Home / Self Care | Payer: Medicare Other | Source: Ambulatory Visit | Attending: Pulmonary Disease | Admitting: Pulmonary Disease

## 2022-04-11 ENCOUNTER — Ambulatory Visit (HOSPITAL_COMMUNITY): Payer: Medicare Other

## 2022-04-11 ENCOUNTER — Ambulatory Visit: Payer: Medicare Other

## 2022-04-11 DIAGNOSIS — R079 Chest pain, unspecified: Secondary | ICD-10-CM | POA: Insufficient documentation

## 2022-04-11 DIAGNOSIS — R0602 Shortness of breath: Secondary | ICD-10-CM | POA: Diagnosis present

## 2022-04-11 LAB — POCT I-STAT CREATININE: Creatinine, Ser: 1 mg/dL (ref 0.44–1.00)

## 2022-04-11 MED ORDER — IOHEXOL 350 MG/ML SOLN
75.0000 mL | Freq: Once | INTRAVENOUS | Status: AC | PRN
  Administered 2022-04-11: 75 mL via INTRAVENOUS

## 2022-04-11 MED ORDER — SODIUM CHLORIDE (PF) 0.9 % IJ SOLN
INTRAMUSCULAR | Status: AC
  Filled 2022-04-11: qty 50

## 2022-04-12 ENCOUNTER — Other Ambulatory Visit: Payer: Self-pay | Admitting: Gastroenterology

## 2022-04-13 NOTE — Progress Notes (Signed)
Please let patient know CTA was negative for PE. Nodular contour to liver  which may represent possible cirrhosis. Needs to follow-up with PCP regarding this. May need ultrasound of liver.

## 2022-04-16 ENCOUNTER — Ambulatory Visit (INDEPENDENT_AMBULATORY_CARE_PROVIDER_SITE_OTHER): Payer: Medicare Other

## 2022-04-16 ENCOUNTER — Telehealth: Payer: Self-pay | Admitting: Primary Care

## 2022-04-16 DIAGNOSIS — R0609 Other forms of dyspnea: Secondary | ICD-10-CM | POA: Diagnosis not present

## 2022-04-16 NOTE — Telephone Encounter (Signed)
Spoke with the pt and answered her questions regarding 22m  ?Nothing further needed ?

## 2022-04-16 NOTE — Progress Notes (Signed)
Six Minute Walk - 04/16/22 1523   ? ?  ? Six Minute Walk  ? Medications taken before test (dose and time) levothyroid, metoprolol   ? Supplemental oxygen during test? No   ? Lap distance in meters  34 meters   ? Laps Completed 12   ? Partial lap (in meters) 0 meters   ? Baseline BP (sitting) 138/68   ? Baseline Heartrate 105   ? Baseline Dyspnea (Borg Scale) 2   ? Baseline Fatigue (Borg Scale) 2   ? Baseline SPO2 97 %   on RA  ?  ? 2 Minutes Post Walk Values  ? BP (sitting) 170/70   ? Heartrate 145   ? SPO2 96 %   ? Stopped or paused before six minutes? No   ? Other Symptoms at end of exercise: Hip pain   also minimal chest tightness  ?  ? Interpretation  ? Distance completed 408 meters   ? ?  ?  ? ?  ? ? ?

## 2022-05-02 ENCOUNTER — Telehealth: Payer: Self-pay | Admitting: Primary Care

## 2022-05-03 ENCOUNTER — Other Ambulatory Visit: Payer: Self-pay | Admitting: Podiatry

## 2022-05-03 ENCOUNTER — Ambulatory Visit: Payer: Medicare Other | Admitting: Podiatry

## 2022-05-03 ENCOUNTER — Ambulatory Visit (INDEPENDENT_AMBULATORY_CARE_PROVIDER_SITE_OTHER): Payer: Medicare Other

## 2022-05-03 DIAGNOSIS — L03032 Cellulitis of left toe: Secondary | ICD-10-CM | POA: Diagnosis not present

## 2022-05-03 DIAGNOSIS — L97522 Non-pressure chronic ulcer of other part of left foot with fat layer exposed: Secondary | ICD-10-CM

## 2022-05-03 MED ORDER — SULFAMETHOXAZOLE-TRIMETHOPRIM 800-160 MG PO TABS: 1.0000 | ORAL_TABLET | Freq: Two times a day (BID) | ORAL | 0 refills | Status: DC

## 2022-05-03 NOTE — Telephone Encounter (Signed)
Called and spoke with patient. She was wanting to go over her 91m results again. Her cardiologist is trying to get oxygen for her. She was under the impression that the 628mwould be completed with the pulse ox on her finger. I explained to her that it sounds like she needs a qualifying walk but I would check with Beth first.   BeEustaquio Maizecan you please advise?

## 2022-05-04 NOTE — Telephone Encounter (Signed)
Left message for patient to call back  

## 2022-05-04 NOTE — Telephone Encounter (Signed)
She completed the 42m on 04/16/22.         Six Minute Walk    Medications taken before test (dose and time) levothyroid, metoprolol     Supplemental oxygen during test? No     Lap distance in meters  34 meters     Laps Completed 12     Partial lap (in meters) 0 meters     Baseline BP (sitting) 138/68     Baseline Heartrate 105     Baseline Dyspnea (Borg Scale) 2     Baseline Fatigue (Borg Scale) 2     Baseline SPO2 97 %   on RA         2 Minutes Post Walk Values    BP (sitting) 170/70     Heartrate 145     SPO2 96 %     Stopped or paused before six minutes? No     Other Symptoms at end of exercise: Hip pain   also minimal chest tightness         Interpretation

## 2022-05-04 NOTE — Telephone Encounter (Signed)
Did she have a 6MWT with [place]? She was walked in office in April and lowest her o2 went was 92% and did not qualify for oxygen that is why I wanted her to do 6MWT to see if she desaturates <88% and hopefully use that to get her oxygen

## 2022-05-04 NOTE — Telephone Encounter (Signed)
Thank you. She did not meet requirements for supplemental oxygen. Does not need oxygen with exertion. Follow up with cardiology.

## 2022-05-06 NOTE — Progress Notes (Signed)
Subjective: 56 year old female presents the office today for follow-up evaluation of a wound on the left foot.  She states that she has been doing well and the wound has been stable since I last saw her.  However recently she started noticed increased swelling or redness to the toe.  No recent injuries that she reports.  She denies any fevers or chills.  No drainage or pus.  Objective: AAO x3, NAD DP/PT pulses palpable bilaterally, CRT less than 3 seconds  LEFT: Plantar aspect of the toe is hyperkeratotic lesion.  Unable to measure the wound prior to debridement.  After debridement of the wound there is a superficial granular wound measuring 0.4 x 0.3 x 0.1 cm without any probing, undermining or tunneling.  There is localized edema and erythema to the hallux without any ascending cellulitis.  There is no fluctuation or crepitation.  There is no malodor. No pain with calf compression, swelling, warmth, erythema bilaterally  Assessment: Left hallux ulceration, localized cellulitis  Plan: -All treatment options discussed with the patient including all alternatives, risks, complications. -X-rays obtained reviewed.  3 views of the left foot were obtained.  No definitive changes suggestive of osteomyelitis. -Sharply debrided the wound, hyperkeratotic lesion without any complications utilizing a #312 with scalpel down to healthy, granular tissue to remove any nonviable devitalized tissue in order to promote wound healing.  Minimal blood loss and hemostasis achieved through manual compression.  Unable to measure the wound prior to debridement and post wound measurements are noted above.  I cleaned the area with saline.  Silvadene was applied followed by dressing.  Discussed daily dressing changes. -Prescribed Bactrim -Offloading -Monitor for any clinical signs or symptoms of infection and directed to call the office immediately should any occur or go to the ER.  Trula Slade DPM

## 2022-05-07 NOTE — Assessment & Plan Note (Signed)
-   Patient is 43% compliant with CPAP > 4 hours. Pressure 5-15cm h20 with AHI 1.8/hr. Encourage patient be more consisent with CPAP use.

## 2022-05-07 NOTE — Assessment & Plan Note (Addendum)
-   Increased dyspnea with exertion and tachycardia. Associated wheezing. Asthma does not appear acutely exacerbated today. Plan increased Breo Ellipta 275mg one puff daily. She did not need supplemental oxygen on simple walk test today. Checking D-dimer, BNP and 6 MWT.

## 2022-05-11 NOTE — Telephone Encounter (Signed)
Pt aware of 6MW results- did not qualify for o2  She was upset about this and wants to discuss more with provider  Visit scheduled for 05/14/21

## 2022-05-14 ENCOUNTER — Telehealth (INDEPENDENT_AMBULATORY_CARE_PROVIDER_SITE_OTHER): Payer: Medicare Other | Admitting: Primary Care

## 2022-05-14 ENCOUNTER — Telehealth: Payer: Self-pay | Admitting: Primary Care

## 2022-05-14 DIAGNOSIS — J986 Disorders of diaphragm: Secondary | ICD-10-CM | POA: Diagnosis not present

## 2022-05-14 DIAGNOSIS — R0609 Other forms of dyspnea: Secondary | ICD-10-CM | POA: Diagnosis not present

## 2022-05-14 DIAGNOSIS — J4530 Mild persistent asthma, uncomplicated: Secondary | ICD-10-CM

## 2022-05-14 NOTE — Telephone Encounter (Signed)
Routing this encounter to both Mel Almond and [place] for them to review, especially since BW is wanting the pt to be walked up a flight of stairs.

## 2022-05-14 NOTE — Telephone Encounter (Signed)
There has been some issues getting patient qualified for oxygen. She does not current use oxygen but feels she needs it.  During her last office visit we did a simple walk test, she did not meet requirements for supplemental oxygen at that time.  Patient was ordered for 6-minute walk test.  She completed this on 04/16/2022.  SPO2 at baseline was 97% on room air and after 2 minutes post walk O2 was 96% room air.  Can we possibly repeat qualifying O2 walk with specifically [name]. I need her to walk 3 laps with continuous forehead O2 probe. I would like to see if we could check her oxygen while walking up a flight of stairs. If she does not qualify for oxygen please notify me as I will potentially need to a letter for medical necessity for O2 use

## 2022-05-14 NOTE — Progress Notes (Signed)
Virtual Visit via Video Note  I connected with [name] on 05/14/22 at 11:00 AM EDT by a video enabled telemedicine application and verified that I am speaking with the correct person using two identifiers.  Location: Patient: Home Provider: Office    I discussed the limitations of evaluation and management by telemedicine and the availability of in person appointments. The patient expressed understanding and agreed to proceed.  History of Present Illness: 56 year old female, never smoked. PMH significant for OSA, asthma, HTN, diaphragm dysfunction, GERD, type 2 diabetes, CIDP, diabetes, hyperlipidemia, obesity.   Previous [facility] pulmonary encounters:  04/06/2022 Patient presents today for acute visit. Patient reports shortness of breath and racing heart rate. She gets exteremly short of breath with exertion. No associated upper respiratory symptoms. She is compliant with BREO Ellipta. Lowest O2 saturation today was 92% today on room air. PFT in January 2022 showed mild restriction. HST in July 2009 showed mild obstructive sleep apnea, AHI 6/hr. She is on CPAP. She gets IVIG every 21 days.   Airview download 03/06/22-04/04/22 22/30 days (73%); 13 days (43%) > 4 hours Average usage 3 hours 26 mins Pressure 5-15cm h20 (9cm h20-95) Airleaks 33.5L/min (95%) AHI 1.8   05/14/2022 Patient contacted today for virtual visit to review recent 6-minute walk test results. She remains very frustrated as she feels she needs supplemental oxygen.  During her last visit we did a simple walk test and patient did not qualify for supplemental oxygen at that time.  6-minute walk test was completed on 04/16/2022, baseline SPO2 low was 97% on room air. After 2-minute post walk SPO2 was 96% on room air.  Patient continues to complain of dyspnea symptoms which are worse when she is outside in the heat and humidity.  Her symptoms mainly occur during exertion and for the most part do improve with rest. She is compliant  with CPAP use.   Observations/Objective:  - Appears well; No overt shortness of breath and able to speak in full sentences   Assessment and Plan:  Dyspnea - Patient continues to struggle with dyspnea symptoms especially with exertion. Feels she needs oxygen. She has a hx of asthma, diaphragm dysfunction and OSA on CPAP. CTA was negative for PE. We will repeat qualifying walk test for O2 need with forehead probe and stairs if able.   Asthma: - Does not appear acutely exacerbated  - Continue Breo 215mg daily one puff daily in the morning  - Consider escalating to BSunGardat next visit if continues to have symptoms.  - Encourage regular cardiovascular exercise   Follow Up Instructions:  After testing   I discussed the assessment and treatment plan with the patient. The patient was provided an opportunity to ask questions and all were answered. The patient agreed with the plan and demonstrated an understanding of the instructions.   The patient was advised to call back or seek an in-person evaluation if the symptoms worsen or if the condition fails to improve as anticipated.  I provided 22 minutes of non-face-to-face time during this encounter.   EMartyn Ehrich NP

## 2022-05-14 NOTE — Patient Instructions (Signed)
Qualifying walk test on forehead probe and stairs Follow up after testing

## 2022-05-15 NOTE — Telephone Encounter (Signed)
Per Tammy D not appropriate to walk pt up the stairs in this office- steps are very narrow and this is a safety issue. Dr Elsworth Soho, please review this phone note and advise what you rec. Pt thinks she needs o2 but has not qualified by walk in office during visit or 6MW test.

## 2022-05-16 NOTE — Telephone Encounter (Signed)
-----   Message from [name], PA-C sent at 04/19/2022 10:00 AM EDT ----- Regarding: FW: Patient feels she needs oxygen Can we order her 2L of O2 via  to use with exertion based on our office note?    She went to see Dr. Elsworth Soho and did not desat with ambulation   ----- Message ----- From: Rigoberto Noel, MD Sent: 04/19/2022   8:50 AM EDT To: [name], PA-C, # Subject: RE: Patient feels she needs oxygen             [name],  Are you able to order oxygen for her please based on her desat during the OV with you? We are not able to order since she does not desat on the visists with [place]... I do want her to get oxygen.  Thanks,  [name] ----- Message ----- From: [name], NP Sent: 04/18/2022   6:10 PM EDT To: Rigoberto Noel, MD Subject: RE: Patient feels she needs oxygen             I agree, I would like to get her oxygen she just has not been able to qualify with [place]. From my understanding we were told we could not use that documentation from cardiologist because oxygen wasn't ordered at that visit. I will follow-up on 6MWT results and discuss POC with her   ----- Message ----- From: Rigoberto Noel, MD Sent: 04/06/2022   1:15 PM EDT To: [name], NP Subject: RE: Patient feels she needs oxygen             Would like to get her oxygen. There is apparently a documentation of desaturation when in cardiology office with PA.  Perhaps we can use that. She can of course buy her own POC anytime but we should at least make an attempt to get it for her through her insurance  Thanks for taking care of her,  [name] ----- Message ----- From: [name], NP Sent: 04/06/2022   9:53 AM EDT To: Rigoberto Noel, MD Subject: Patient feels she needs oxygen                 Patient feels she needs oxygen during daytime. Extremely out of with with exertion and HR goes in 130s. She did not desaturate today on simple walk test, she was able to complete 2 laps and  lowest SPO2 was 92% RA. I am ordering 6MWT and d-dimer.  Any recommendations? She cant qualify for oxygen d.t her comorbities can she? Could she buy her own POC  -Beth

## 2022-05-16 NOTE — Telephone Encounter (Signed)
I spoke with pt and informed her that her walking into the office and her O2 dropping does not qualify her for Oxygen. She has to do the 6 minute walk test. She stated that Dr. Bari Mantis office is going to redo the test and if it doesn't qualify her she will have his office and our office to write a letter to try and get her approved.

## 2022-05-16 NOTE — Telephone Encounter (Signed)
Called patient this morning to explain to her for safety reasons we are unable to walk her up and down the stairs.   The schedule for 6 minutes walks are not open yet. I told her that someone will call her when the schedule is open.   Patient states that she would only like Cherina to walk her.   I told patient as soon as the schedule comes out we can see what we can do.  Thank you   Tammy please advise

## 2022-05-16 NOTE — Telephone Encounter (Signed)
Please schedule patient for qualifying walk test. From what I recall when we walked her the first time in office she did not do a total of three laps and did not use forehead probe. She is requesting walk be done with cherina if we are able to accommodate this. Please explain that we are unable walk her on stairs d/t safety

## 2022-05-23 ENCOUNTER — Ambulatory Visit: Payer: Medicare Other

## 2022-05-23 ENCOUNTER — Telehealth: Payer: Self-pay | Admitting: Pulmonary Disease

## 2022-05-23 DIAGNOSIS — J4530 Mild persistent asthma, uncomplicated: Secondary | ICD-10-CM

## 2022-05-23 DIAGNOSIS — R0609 Other forms of dyspnea: Secondary | ICD-10-CM

## 2022-05-23 DIAGNOSIS — G4733 Obstructive sleep apnea (adult) (pediatric): Secondary | ICD-10-CM

## 2022-05-23 DIAGNOSIS — G6181 Chronic inflammatory demyelinating polyneuritis: Secondary | ICD-10-CM

## 2022-05-23 DIAGNOSIS — J986 Disorders of diaphragm: Secondary | ICD-10-CM

## 2022-05-23 NOTE — Telephone Encounter (Signed)
We can order an ONO on CPAP. She should return to cardiology, may need additional cardiac testing like stress echocardiogram

## 2022-05-23 NOTE — Telephone Encounter (Signed)
Patient came in today for qualifying walk and walked average pace with walker. I set a timer for 6 minutes per her request and she walked the entire time only stopping once. Patient expressed shortness of breath mid way through. She also said she felt like something was sitting on her chest, that she couldn't get a deep breath and that her heart felt like it was going to pop out of her chest. Patient does wear CPAP and is wondering about doing an ONO to see if she drops when sleeping. She expressed she was glad to see her sats and HR the entire time and that her lungs are working like they should but she is still very short of breath and is also wondering if it could be due to her heart rate going to high. She ended at 140 and has gone higher on previous walks.   Please advise if you would be ok order ONO and any other recommendations

## 2022-05-23 NOTE — Telephone Encounter (Signed)
Called and spoke with patient to let her know that we are ordering ONO on CPAP and that she will get a call to get it scheduled and that we want her to follow up with cardiology due to her HR going so high. Notes have been faxed to cardiology office. Nothing further needed at this time.

## 2022-05-28 ENCOUNTER — Ambulatory Visit: Payer: Medicare Other | Admitting: Podiatry

## 2022-05-28 ENCOUNTER — Encounter: Payer: Self-pay | Admitting: Podiatry

## 2022-05-28 DIAGNOSIS — L97522 Non-pressure chronic ulcer of other part of left foot with fat layer exposed: Secondary | ICD-10-CM

## 2022-05-28 DIAGNOSIS — L03032 Cellulitis of left toe: Secondary | ICD-10-CM

## 2022-05-30 ENCOUNTER — Other Ambulatory Visit: Payer: Self-pay | Admitting: Podiatry

## 2022-05-30 ENCOUNTER — Telehealth: Payer: Self-pay | Admitting: *Deleted

## 2022-05-30 MED ORDER — UREA NAIL 45 % EX GEL
1.0000 | Freq: Every day | CUTANEOUS | 1 refills | Status: DC
Start: 2022-05-30 — End: 2022-06-13

## 2022-05-30 NOTE — Telephone Encounter (Signed)
Pharmacy w/ [facility] is calling for clarification on the urea 45% nail gel, what product is this, is it an otc item, not able to find and want to make sure they are dispensing the right medication.Please advise.

## 2022-05-30 NOTE — Telephone Encounter (Signed)
It is typically an OTC item.

## 2022-05-30 NOTE — Telephone Encounter (Signed)
She can try the cream

## 2022-05-30 NOTE — Telephone Encounter (Signed)
Called pharmacy to inform and they don't carry any gels just the creams and not 45 %,please advise.

## 2022-06-13 ENCOUNTER — Other Ambulatory Visit: Payer: Self-pay | Admitting: *Deleted

## 2022-06-13 MED ORDER — UREA NAIL 45 % EX GEL: 1.0000 | Freq: Every day | CUTANEOUS | 1 refills | Status: DC

## 2022-06-14 ENCOUNTER — Telehealth: Payer: Self-pay | Admitting: Primary Care

## 2022-06-14 DIAGNOSIS — G4733 Obstructive sleep apnea (adult) (pediatric): Secondary | ICD-10-CM

## 2022-06-14 NOTE — Telephone Encounter (Signed)
Called and spoke with pt letting her know the results of the ONO and she verbalized understanding. Order placed for cpap titration. Nothing further needed.

## 2022-06-14 NOTE — Telephone Encounter (Signed)
Overnight oximetry test on CPAP on 06/03/2022 showed patient spent 8 minutes with SPO2 less than 88%.  SPO2 low 82% with average 91%.  Looks like she may qualify for nocturnal oxygen. Patient needs in lab CPAP titration study to determine oxygen liter flow.  Please place order and notify patient.

## 2022-06-22 ENCOUNTER — Ambulatory Visit: Payer: Medicare Other | Admitting: Podiatry

## 2022-06-25 ENCOUNTER — Other Ambulatory Visit: Payer: Self-pay | Admitting: Gastroenterology

## 2022-07-10 ENCOUNTER — Ambulatory Visit (INDEPENDENT_AMBULATORY_CARE_PROVIDER_SITE_OTHER): Payer: Medicare Other

## 2022-07-10 ENCOUNTER — Ambulatory Visit: Payer: Medicare Other | Admitting: Podiatry

## 2022-07-10 DIAGNOSIS — L97522 Non-pressure chronic ulcer of other part of left foot with fat layer exposed: Secondary | ICD-10-CM

## 2022-07-10 DIAGNOSIS — S90221A Contusion of right lesser toe(s) with damage to nail, initial encounter: Secondary | ICD-10-CM | POA: Diagnosis not present

## 2022-07-14 NOTE — Progress Notes (Signed)
Subjective: 56 year old female presents the office today for follow-up evaluation of a wound on the left foot.  She states this was doing better but skin fissure has formed the wound itself is healed.  No drainage or pus coming from this area.  She is concerned of her right big toe as she had some dried blood forming the top of her right big toe.  She did hit her toe.  No drainage or pus.  No recent treatment for this.  No other concerns.   Objective: AAO x3, NAD DP/PT pulses palpable bilaterally, CRT less than 3 seconds  LEFT: Plantar aspect of the toe is hyperkeratotic lesion.  Upon debridement actual ulceration was present previously has resolved however there is a transverse skin fissure on the sulcus of the toe with superficial skin breakdown but there is no drainage or pus.  No exposed bone or tendon.  No edema, erythema.  No other open lesion on the left foot. RIGHT: Dried blood present on the hallux.  He does try to debride this but is firmly adhered.  There is no drainage or pus.  Minimal edema to the toe without any increased temperature or any ascending cellulitis.  No fluctuation or crepitation.  No malodor No pain with calf compression, swelling, warmth, erythema bilaterally  Assessment: Left hallux ulceration, skin fissure; right hallux contusion, subungual hematoma  Plan: -All treatment options discussed with the patient including all alternatives, risks, complications. -X-rays obtained and reviewed of the right foot.  No other acute fracture, osteomyelitis noted today. -On the left side sharply debrided hyperkeratotic tissue, skin fissure with any complications.  Superficial skin breakdown noted.  Recommend antibiotic ointment dressing changes daily. -Recommend antibiotic ointment dressing changes in the right foot daily. -Offloading -Monitor for any clinical signs or symptoms of infection and directed to call the office immediately should any occur or go to the ER.  Return in  about 3 weeks (around 07/31/2022).  Trula Slade DPM

## 2022-07-23 ENCOUNTER — Other Ambulatory Visit: Payer: Self-pay | Admitting: Gastroenterology

## 2022-07-31 ENCOUNTER — Other Ambulatory Visit: Payer: Self-pay | Admitting: Primary Care

## 2022-07-31 ENCOUNTER — Ambulatory Visit: Payer: Medicare Other | Admitting: Podiatry

## 2022-07-31 DIAGNOSIS — S90221D Contusion of right lesser toe(s) with damage to nail, subsequent encounter: Secondary | ICD-10-CM

## 2022-07-31 DIAGNOSIS — L97522 Non-pressure chronic ulcer of other part of left foot with fat layer exposed: Secondary | ICD-10-CM

## 2022-07-31 NOTE — Progress Notes (Signed)
Subjective: 56 year old female presents the office today for follow-up evaluation of a wound on the left foot.  States that she is doing better from the wound itself however the skin fissure still present.  Denies any drainage or bleeding .,  Denies any increase swelling or redness.  Right foot been doing much better.  No fevers or chills.  No other concerns.   Objective: AAO x3, NAD DP/PT pulses palpable bilaterally, CRT less than 3 seconds  LEFT: Plantar aspect of the toe is hyperkeratotic lesion.  There is no underlying ulceration noted to this area.  This proximal to the areas of skin fissure and upon debridement there is a superficial skin fissure noted but there is no drainage or pus.  No probing, undermining or tunneling.  No exposed bone or tendon.  No significant edema and there is no erythema. RIGHT: Dried blood present on the hallux.  He does try to debride this but is firmly adhered.  There is no pain today and there is no edema, erythema.  No drainage or pus or any signs of infection No pain with calf compression, swelling, warmth, erythema bilaterally  Assessment: Left hallux ulceration, skin fissure; right hallux contusion, subungual hematoma  Plan: -All treatment options discussed with the patient including all alternatives, risks, complications. -Sharply debrided the hyperkeratotic lesion, skin fissure left side without any complications.  Recommend continue with small amount of antibiotic ointment for now but can also switch to Vaseline or moisturizer to help with the dryness. -Right side is doing much better.  No signs of infection. -Recommend antibiotic ointment dressing changes in the right foot daily. -Offloading -Monitor for any clinical signs or symptoms of infection and directed to call the office immediately should any occur or go to the ER.  Trula Slade DPM

## 2022-08-03 ENCOUNTER — Other Ambulatory Visit: Payer: Self-pay | Admitting: Primary Care

## 2022-08-03 NOTE — Telephone Encounter (Signed)
Refill sent to the pharmacy 

## 2022-08-06 ENCOUNTER — Other Ambulatory Visit: Payer: Self-pay | Admitting: Gastroenterology

## 2022-08-14 ENCOUNTER — Ambulatory Visit (HOSPITAL_BASED_OUTPATIENT_CLINIC_OR_DEPARTMENT_OTHER): Payer: Medicare Other | Attending: Primary Care | Admitting: Pulmonary Disease

## 2022-08-14 DIAGNOSIS — R0683 Snoring: Secondary | ICD-10-CM | POA: Diagnosis not present

## 2022-08-14 DIAGNOSIS — G6181 Chronic inflammatory demyelinating polyneuritis: Secondary | ICD-10-CM | POA: Diagnosis not present

## 2022-08-14 DIAGNOSIS — G4733 Obstructive sleep apnea (adult) (pediatric): Secondary | ICD-10-CM | POA: Insufficient documentation

## 2022-08-14 DIAGNOSIS — J986 Disorders of diaphragm: Secondary | ICD-10-CM | POA: Insufficient documentation

## 2022-08-16 ENCOUNTER — Telehealth: Payer: Self-pay | Admitting: Pulmonary Disease

## 2022-08-16 DIAGNOSIS — G4733 Obstructive sleep apnea (adult) (pediatric): Secondary | ICD-10-CM

## 2022-08-16 NOTE — Telephone Encounter (Signed)
BiPAP was used during the sleep study Please send an order for auto BiPAP - IPAP max 24, PS +4, EPAP min 8 cm Dc CPAP  OV in 1 month after starting

## 2022-08-16 NOTE — Procedures (Signed)
Patient Name: Cree, [place]oli Date: 08/14/2022 Gender: Female D.O.B: 19-Jan-1966 Age (years): 2 Referring Provider: Geraldo Pitter NP Height (inches): 67 Interpreting Physician: Kara Mead MD, ABSM Weight (lbs): 309 RPSGT: Gwenyth Allegra BMI: 48 MRN: [id number] Neck Size: 17.00 <br> <br> CLINICAL INFORMATION The patient is referred for a PAP titration to treat sleep apnea. HST 01/2021 mild OSA, AHI 7/h She has CIDP & diaphragmatic weakness    SLEEP STUDY TECHNIQUE As per the AASM Manual for the Scoring of Sleep and Associated Events v2.3 (April 2016) with a hypopnea requiring 4% desaturations.  The channels recorded and monitored were frontal, central and occipital EEG, electrooculogram (EOG), submentalis EMG (chin), nasal and oral airflow, thoracic and abdominal wall motion, anterior tibialis EMG, snore microphone, electrocardiogram, and pulse oximetry. Bilevel positive airway pressure (BPAP) was initiated at the beginning of the study and titrated to treat sleep-disordered breathing.  MEDICATIONS Medications self-administered by patient taken the night of the study : N/A  RESPIRATORY PARAMETERS Optimal IPAP Pressure (cm): 24 AHI at Optimal Pressure (/hr) 0 Optimal EPAP Pressure (cm): 20   Overall Minimal O2 (%): 83.0 Minimal O2 at Optimal Pressure (%): 90.0 SLEEP ARCHITECTURE Start Time: 10:57:24 PM Stop Time: 5:01:47 AM Total Time (min): 364.4 Total Sleep Time (min): 224.2 Sleep Latency (min): 98.2 Sleep Efficiency (%): 61.5% REM Latency (min): 91.5 WASO (min): 42.0 Stage N1 (%): 12.7% Stage N2 (%): 68.6% Stage N3 (%): 0.0% Stage R (%): 18.7 Supine (%): 68.99 Arousal Index (/hr): 23.0     CARDIAC DATA The 2 lead EKG demonstrated sinus rhythm. The mean heart rate was 87.2 beats per minute. Other EKG findings include: None.   LEG MOVEMENT DATA The total Periodic Limb Movements of Sleep (PLMS) were 0. The PLMS index was 0.0. A PLMS index of <15 is considered normal  in adults.  IMPRESSIONS - An optimal BiPAP pressure was selected for this patient ( 24 /20 cm of water). CPAP was titrated to 18 cm but could not toelrate due to persistent hypoxia, RERAs & hypopneas - Moderete oxygen desaturations were observed during this titration (min O2 = 83.0%). - The patient snored with moderate snoring volume. - No cardiac abnormalities were observed during this study. - Clinically significant periodic limb movements were not noted during this study.   Arousals associated with PLMs were rare. DIAGNOSIS - Obstructive Sleep Apnea (G47.33)   RECOMMENDATIONS - Trial of BiPAP therapy on 24/20 cm H2O with a Small size Fisher[employer]Paykel Full Face Vitera mask and heated humidification. Alternatively autoBiPAP can be tried - Avoid alcohol, sedatives and other CNS depressants that may worsen sleep apnea and disrupt normal sleep architecture. - Sleep hygiene should be reviewed to assess factors that may improve sleep quality. - Weight management and regular exercise should be initiated or continued. - Return to [facility] Center for re-evaluation after 4 weeks of therapy    Kara Mead MD Board Certified in [place]

## 2022-08-17 ENCOUNTER — Telehealth: Payer: Self-pay

## 2022-08-17 NOTE — Telephone Encounter (Signed)
Order has been placed. Nothing further needed. 

## 2022-08-17 NOTE — Telephone Encounter (Signed)
Can you make sure BIPAP order has been placed. Thanks

## 2022-08-17 NOTE — Telephone Encounter (Signed)
Called Pt and explained she was changing to a BIPAP and she stated she agreed. Nothing further needed.

## 2022-08-21 ENCOUNTER — Ambulatory Visit: Payer: Medicare Other | Admitting: Podiatry

## 2022-08-21 ENCOUNTER — Other Ambulatory Visit: Payer: Self-pay | Admitting: Gastroenterology

## 2022-08-21 DIAGNOSIS — R234 Changes in skin texture: Secondary | ICD-10-CM

## 2022-08-21 DIAGNOSIS — S90221D Contusion of right lesser toe(s) with damage to nail, subsequent encounter: Secondary | ICD-10-CM

## 2022-08-25 ENCOUNTER — Other Ambulatory Visit: Payer: Self-pay | Admitting: Internal Medicine

## 2022-08-27 NOTE — Progress Notes (Signed)
Subjective: 56 year old female presents the office today for follow-up evaluation of a wound on the left foot.  She states the scab on the right big toe has come off.  She still keeping moisturizer on the skin for the left foot.  No drainage or pus.  No fevers or chills.  No other concerns.    Objective: AAO x3, NAD DP/PT pulses palpable bilaterally, CRT less than 3 seconds  LEFT: Plantar aspect of the toe is hyperkeratotic lesion.  Upon debridement there is no underlying ulceration.  There is no granulation tissue in the wound appears to be healed however it still dry skin fissure and preulcerative. RIGHT: Dried blood present on the hallux.  Most of the scabs come off.  There is no probing to bone or joint.  No edema, erythema or signs of infection. No pain with calf compression, swelling, warmth, erythema bilaterally  Assessment: Left hallux ulceration, skin fissure; right hallux contusion, subungual hematoma  Plan: -All treatment options discussed with the patient including all alternatives, risks, complications. -Sharply debrided the hyperkeratotic lesion, skin fissure left side without any complications.  Continue moisturizer.   -Antibiotic ointment on the right big toe daily.   -Monitor for any clinical signs or symptoms of infection and directed to call the office immediately should any occur or go to the ER.  Return in about 6 weeks (around 10/02/2022).  Trula Slade DPM

## 2022-09-18 ENCOUNTER — Other Ambulatory Visit: Payer: Self-pay | Admitting: Gastroenterology

## 2022-10-02 ENCOUNTER — Other Ambulatory Visit: Payer: Self-pay | Admitting: Gastroenterology

## 2022-10-05 ENCOUNTER — Ambulatory Visit: Payer: Medicare Other | Admitting: Podiatry

## 2022-10-17 ENCOUNTER — Other Ambulatory Visit: Payer: Self-pay | Admitting: Gastroenterology

## 2022-10-19 ENCOUNTER — Ambulatory Visit: Payer: Medicare Other | Admitting: Podiatry

## 2022-10-19 DIAGNOSIS — L84 Corns and callosities: Secondary | ICD-10-CM | POA: Diagnosis not present

## 2022-10-19 DIAGNOSIS — S90852S Superficial foreign body, left foot, sequela: Secondary | ICD-10-CM | POA: Diagnosis not present

## 2022-10-19 NOTE — Progress Notes (Signed)
Subjective: Chief Complaint  Patient presents with   Diabetic Ulcer    Diabetic A1c- 7.1 left foot hallux, no drainage, patient denies any pain     56 year old female presents the office today for follow-up evaluation of a wound on the left foot.  She states that this is doing well.  She did get a splinter in the bottom of her left foot that her husband had to get out himself.  He states that she been going barefoot around the house.  No swelling redness or any drainage.   Objective: AAO x3, NAD DP/PT pulses palpable bilaterally, CRT less than 3 seconds  LEFT: Plantar aspect of the toe is hyperkeratotic lesion.  Small mount of dried blood present under the callus.  Upon debridement there is no underlying ulceration drainage or any signs of infection.  On the plantar aspect left foot is a small scab, there was an apparent splinter.  Is able to debride this no further foreign body identified.  No drainage or pus.  No swelling or redness. Dry skin present bilaterally. No other open lesions. No pain with calf compression, swelling, warmth, erythema bilaterally  Assessment: Left hallux ulceration, resolved foreign body left foot with superficial wound  Plan: -All treatment options discussed with the patient including all alternatives, risks, complications. -Sharply debrided the hyperkeratotic lesion, skin fissure left side without any complications.  Continue moisturizer.   -Patient debrided the area where she had a foreign body at.  Not able to appreciate any further foreign body.  Antibiotic ointment and dressing applied.  Continue daily dressing changes.  Advised not to go barefoot and did stressed the importance today of daily foot inspection.  -Monitor for any clinical signs or symptoms of infection and directed to call the office immediately should any occur or go to the ER.  RTC 6 weeks or sooner if needed  Trula Slade DPM

## 2022-11-02 ENCOUNTER — Other Ambulatory Visit: Payer: Self-pay | Admitting: Primary Care

## 2022-11-07 ENCOUNTER — Ambulatory Visit (INDEPENDENT_AMBULATORY_CARE_PROVIDER_SITE_OTHER): Payer: Medicare Other | Admitting: Pulmonary Disease

## 2022-11-07 ENCOUNTER — Encounter (HOSPITAL_BASED_OUTPATIENT_CLINIC_OR_DEPARTMENT_OTHER): Payer: Self-pay | Admitting: Pulmonary Disease

## 2022-11-07 VITALS — BP 128/72 | HR 95 | Temp 98.5°F | Ht 66.0 in | Wt 301.2 lb

## 2022-11-07 DIAGNOSIS — J4530 Mild persistent asthma, uncomplicated: Secondary | ICD-10-CM | POA: Diagnosis not present

## 2022-11-07 DIAGNOSIS — G4733 Obstructive sleep apnea (adult) (pediatric): Secondary | ICD-10-CM | POA: Diagnosis not present

## 2022-11-07 NOTE — Assessment & Plan Note (Addendum)
Continue Breo once daily. Use albuterol only for rescue and I did warn her about increased tachycardia with this.  Since her dyspnea is mainly related to exertion resting seems to improve her tachycardia and dyspnea, she may not need her rescue inhaler as much I feel that deconditioning and obesity will contribute to her dyspnea. We discussed station exercises including recumbent bike Due to her limited mobility and fall risk she is not a candidate for center-based rehab

## 2022-11-07 NOTE — Assessment & Plan Note (Signed)
BiPAP download was reviewed which shows good control of events, only weeks data was available and although IPAP maximum is set at 24 with EPAP minimum of 8, she only seems to require 14/8 on average She definitely feels subjectively improved and this is corroborated by her husband Weight loss encouraged, compliance with goal of at least 4-6 hrs every night is the expectation. Advised against medications with sedative side effects Cautioned against driving when sleepy - understanding that sleepiness will vary on a day to day basis

## 2022-11-07 NOTE — Patient Instructions (Addendum)
Try to use BiPAP at least 6 h every night - review download in 67monthUse Breo once daily Albuterol every 6h as needed only
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
## 2022-11-17 ENCOUNTER — Other Ambulatory Visit: Payer: Self-pay | Admitting: Podiatry

## 2022-11-17 ENCOUNTER — Encounter: Payer: Self-pay | Admitting: Podiatry

## 2022-11-17 MED ORDER — DOXYCYCLINE HYCLATE 100 MG PO TABS: 100.0000 mg | ORAL_TABLET | Freq: Two times a day (BID) | ORAL | 0 refills | Status: DC

## 2022-11-19 ENCOUNTER — Ambulatory Visit: Payer: Medicare Other | Admitting: Podiatry

## 2022-11-19 DIAGNOSIS — S90852S Superficial foreign body, left foot, sequela: Secondary | ICD-10-CM

## 2022-11-19 DIAGNOSIS — L03032 Cellulitis of left toe: Secondary | ICD-10-CM

## 2022-11-19 DIAGNOSIS — L84 Corns and callosities: Secondary | ICD-10-CM

## 2022-11-19 DIAGNOSIS — L97522 Non-pressure chronic ulcer of other part of left foot with fat layer exposed: Secondary | ICD-10-CM | POA: Diagnosis not present

## 2022-11-19 DIAGNOSIS — L02612 Cutaneous abscess of left foot: Secondary | ICD-10-CM

## 2022-11-22 NOTE — Progress Notes (Signed)
Subjective: No chief complaint on file.    56 year old female presents the office today for concerns of her left big toe.  Over the weekend she developed a large blister on the toe.  She does report that she had a fall which may have started this.  The blisters continue to worsen.  She sent a MyChart message over the weekend had started her antibiotics.  She did not pick up the prescription yet but she states that she had doxycycline at home that she has been taking.  No other treatment.  No fever or chills.   Objective: AAO x3, NAD DP/PT pulses palpable bilaterally, CRT less than 3 seconds  LEFT: Plantar aspect of the toe is hyperkeratotic lesion.  Upon debridement superficial granular wound present.  There is a large blister present on the dorsal aspect of the toe.  Upon draining this it was clear fluid no purulence.  There is mild edema and erythema to the toe.  No ascending cellulitis.  No fluctuance or crepitation otherwise.  No malodor. No pain with calf compression, swelling, warmth, erythema bilaterally  Assessment: Left hallux ulceration, large blister  Plan: -All treatment options discussed with the patient including all alternatives, risks, complications. -X-rays were obtained reviewed of the left foot.  No is of acute fracture or osteomyelitis noted. -I drained blister today and a wound culture was obtained.  Continue antibiotics.  Betadine was applied followed by dressing. -Offloading -Monitor for any clinical signs or symptoms of infection and directed to call the office immediately should any occur or go to the ER.  No follow-ups on file.  Trula Slade DPM

## 2022-11-23 LAB — WOUND CULTURE
MICRO NUMBER:: 14297303
RESULT:: NO GROWTH
SPECIMEN QUALITY:: ADEQUATE

## 2022-11-26 ENCOUNTER — Ambulatory Visit: Payer: Medicare Other | Admitting: Podiatry

## 2022-11-26 DIAGNOSIS — L02612 Cutaneous abscess of left foot: Secondary | ICD-10-CM | POA: Diagnosis not present

## 2022-11-26 DIAGNOSIS — L03032 Cellulitis of left toe: Secondary | ICD-10-CM

## 2022-11-26 DIAGNOSIS — L97522 Non-pressure chronic ulcer of other part of left foot with fat layer exposed: Secondary | ICD-10-CM

## 2022-11-26 MED ORDER — DOXYCYCLINE HYCLATE 100 MG PO TABS: 100.0000 mg | ORAL_TABLET | Freq: Two times a day (BID) | ORAL | 0 refills | Status: DC

## 2022-11-26 NOTE — Progress Notes (Signed)
Subjective: Chief Complaint  Patient presents with   Diabetes    Diabetic foot care, A1c-5.3, left foot hallux ulcer, patient is feeling some pain, red and having some drainage.       56 year old female presents the office today for follow-up evaluation of a wound on the left big toe, blister.  She is noticing darkened discoloration pointing along the medial aspect.  Along the lateral aspect she is getting some macerated tissue she reports but to keep the bandage on she is having to tape the first and second toes together.  She is about finished the doxycycline.  Denies any fevers or chills.   Objective: AAO x3, NAD DP/PT pulses palpable bilaterally, CRT less than 3 seconds  LEFT: Along the medial aspect of the hallux on the IPJ is old hemorrhagic blister tissue present.  Is able debride some of this today and bloody drainage was expressed.  There is no purulence.  On the dorsal lateral aspect of the hallux adjacent to the toenail into the lateral aspect the toe is a granular wound.  There is no probing to bone, and or tunneling.  This encompasses about two thirds of the lateral aspect of the toe.  There is some mild edema but there is no significant cellulitis or a sinusitis.  There is no fluctuation or crepitation but there is no malodor.   No pain with calf compression, swelling, warmth, erythema bilaterally  Assessment: Left hallux ulceration  Plan: -All treatment options discussed with the patient including all alternatives, risks, complications. -Sharply debrided the old hemorrhagic blister without any complications to make sure there is no purulence which there was not.  She does have macerated tissue on the second toe along the interspace.  Due to this some want her to switch to Betadine for now.  I painted Betadine on the long the area of the toe followed by dry sterile dressing.  She will continue with twice a day dressing changes.  The size of the wound I am going to continue  antibiotics.  Prescribed doxycycline.  She is to monitor closely for any signs or symptoms of worsening infection.  Discussed possibility that if does not heal or progresses the chance of osteomyelitis, amputation. -Monitor for any clinical signs or symptoms of infection and directed to call the office immediately should any occur or go to the ER.  X-ray next appointment  Trula Slade DPM

## 2022-12-02 ENCOUNTER — Encounter: Payer: Self-pay | Admitting: Podiatry

## 2022-12-04 ENCOUNTER — Ambulatory Visit: Payer: Medicare Other | Attending: Internal Medicine | Admitting: Internal Medicine

## 2022-12-04 ENCOUNTER — Other Ambulatory Visit: Payer: Self-pay | Admitting: Podiatry

## 2022-12-04 ENCOUNTER — Encounter: Payer: Self-pay | Admitting: Internal Medicine

## 2022-12-04 ENCOUNTER — Other Ambulatory Visit: Payer: Self-pay | Admitting: Gastroenterology

## 2022-12-04 ENCOUNTER — Other Ambulatory Visit: Payer: Self-pay | Admitting: Pulmonary Disease

## 2022-12-04 ENCOUNTER — Telehealth: Payer: Self-pay | Admitting: Gastroenterology

## 2022-12-04 VITALS — Ht 66.0 in | Wt 301.2 lb

## 2022-12-04 DIAGNOSIS — R55 Syncope and collapse: Secondary | ICD-10-CM

## 2022-12-04 DIAGNOSIS — R0602 Shortness of breath: Secondary | ICD-10-CM | POA: Diagnosis not present

## 2022-12-04 DIAGNOSIS — I951 Orthostatic hypotension: Secondary | ICD-10-CM | POA: Diagnosis not present

## 2022-12-04 MED ORDER — ONDANSETRON 8 MG PO TBDP: 8.0000 mg | ORAL_TABLET | Freq: Four times a day (QID) | ORAL | 1 refills | Status: DC | PRN

## 2022-12-04 MED ORDER — DICYCLOMINE HCL 10 MG PO CAPS: 10.0000 mg | ORAL_CAPSULE | Freq: Three times a day (TID) | ORAL | 10 refills | Status: DC

## 2022-12-04 MED ORDER — CLONIDINE 0.1 MG/24HR TD PTWK: 0.1000 mg | MEDICATED_PATCH | TRANSDERMAL | 12 refills | Status: DC

## 2022-12-04 MED ORDER — LISINOPRIL 10 MG PO TABS: 10.0000 mg | ORAL_TABLET | Freq: Every day | ORAL | 3 refills | Status: DC

## 2022-12-04 MED ORDER — BISOPROLOL FUMARATE 5 MG PO TABS: 5.0000 mg | ORAL_TABLET | Freq: Every day | ORAL | 3 refills | Status: DC

## 2022-12-04 MED ORDER — HYOSCYAMINE SULFATE 0.125 MG SL SUBL: SUBLINGUAL_TABLET | SUBLINGUAL | 11 refills | Status: DC

## 2022-12-04 NOTE — Patient Instructions (Signed)
Medication Instructions:  Please decrease your Lisinopril to 10 mg a day. Discontinue your Metoprolol and start Bisoprolol 5 mg a day. Start Clonidine patch 0.1 mg in 2 weeks  - apply 1 patch once a week. Continue all other medications as listed.  *If you need a refill on your cardiac medications before your next appointment, please call your pharmacy*  Follow-Up: At [facility], you and your health needs are our priority.  As part of our continuing mission to provide you with exceptional heart care, we have created designated Provider Care Teams.  These Care Teams include your primary Cardiologist (physician) and Advanced Practice Providers (APPs -  Physician Assistants and Nurse Practitioners) who all work together to provide you with the care you need, when you need it.  We recommend signing up for the patient portal called "MyChart".  Sign up information is provided on this After Visit Summary.  MyChart is used to connect with patients for Virtual Visits (Telemedicine).  Patients are able to view lab/test results, encounter notes, upcoming appointments, etc.  Non-urgent messages can be sent to your provider as well.   To learn more about what you can do with MyChart, go to [email or web address].    Your next appointment:   1 year(s)  The format for your next appointment:   In Person  Provider:   Dr Virl Axe       Important Information About Sugar

## 2022-12-04 NOTE — Progress Notes (Signed)
Patient Care Team: Reynold Bowen, MD as PCP - General (Endocrinology) Deboraha Sprang, MD as PCP - Electrophysiology (Cardiology) Alda Berthold, DO as Consulting Physician (Neurology)   HPI  [name] is a 56 y.o. female Seen in follow-up for recurrent syncope with hypertension, documented orthostatic hypotension in the context of diabetes with likely some degree of neuropathy;  also diagnosed with CIDP     Nephrology has been managing hypertension and notably, systolic hypertension has precluded sodium loading for volume support  Strategies employed have included 1-abdominal binder associate with skin erosion 2-fluids 3-beta-blockers improving exercise tolerance  Discussed pyridostigmine, did not tolerate ProAmatine.   Today, the patient denies chest pain, no lightheadedness .  Complains of fatigue in the later afternoon, Dyspnea assoc with out exertion,  < 10 ft  HR  increases  to 115-130.   Continue mechanical fall but much improved with compression.  She wears full upper abdominal  body compressions and wear LE compressions since then her edema has got better.  Now once or twice a month mostly with some warning.  She is making efforts to walk.  What seems to be most limiting is tachy palpitations followed by modest relative hypotension.   Complaints of worsening dyspnea on exertion.  Sleep study is abnormal.  Also found to have an elevated right hemidiaphragm and right sided volume loss.  Dr. Elsworth Soho is working on this.      DATE TEST EF%   02/18 Echo  65-70 %   02/22 Echo  60-65 %         Date   Cr              K Hgb   4/19 0.82 4.1         14.1(10/19)   5/21   15.7   11/23 0.83 4.5       Past Medical History:  Diagnosis Date   Anginal pain ([place])    chest pain  heart cath done   Anxiety    Cancer [facility])    thyroid cancer   Chronic bronchitis ([place])    "get it q yr"   Chronic inflammatory demyelinating polyneuropathy ([facility])    Chronic kidney  disease    Proteinuria   Dyspnea    sob with exertion   Early cataract    right   Fibromyalgia    GERD (gastroesophageal reflux disease)    Heart murmur    Hepatic adenoma    High cholesterol    History of gout    History of hiatal hernia    History of kidney stones    Hypertension    Hypothyroidism    IBS (irritable bowel syndrome)    Iron deficiency anemia    "comes and goes"   Migraine    "@ least once/month" (02/18/2015)   Obesity    PONV (postoperative nausea and vomiting)    patch and anti nausea meds work well   Proteinuria    Spinal headache    Syncope    Tachycardia    Type II diabetes mellitus ([place])    pt has an insulin pump   Wears glasses     Past Surgical History:  Procedure Laterality Date   ABDOMINAL HYSTERECTOMY  ~2012   "lap"   ANKLE RECONSTRUCTION Left 03/29/2016   Procedure: LEFT ANKLE LATERAL LIGAMENT RECONSTRUCTION AND PERONEAL TENDON REPAIR OR TENOLYSIS;  Surgeon: Wylene Simmer, MD;  Location: [place];  Service: Orthopedics;  Laterality: Left;  BACK SURGERY     CARDIAC CATHETERIZATION  04/2009   "clean"   COLONOSCOPY  01/12/2015   Colonic polyps status post polypectomy, small internal hemorrhoids. Otherwise normal. Bx: mild chronic gastritis.   CYSTOSCOPY WITH URETEROSCOPY, STONE BASKETRY AND STENT PLACEMENT  ~ 2006   ESOPHAGOGASTRODUODENOSCOPY  07/21/2015   Esophageal stricture status post esophageal dilatation. Mild gastritis.   insulin pump     U 500   IR IMAGING GUIDED PORT INSERTION  09/22/2018   IRRIGATION AND DEBRIDEMENT KNEE Left 04/03/2018   Procedure: Arthroscopic versus open irrigation and debridement  left knee;  Surgeon: Nicholes Stairs, MD;  Location: [place];  Service: Orthopedics;  Laterality: Left;   LAPAROSCOPIC CHOLECYSTECTOMY  05/2004   LUMBAR [place] SURGERY  08/2011   SURAL NERVE BX Left 09/30/2017   Procedure: LEFT SURAL NERVE AND MUSCLE BIOPSY;  Surgeon: Newman Pies, MD;  Location: [place];  Service: Neurosurgery;   Laterality: Left;  LEFT SURAL NERVE AND MUSCLE BIOPSY   THYROIDECTOMY N/A 02/18/2015   Procedure: TOTAL THYROIDECTOMY;  Surgeon: [name], MD;  Location: [place];  Service: General;  Laterality: N/A;   TOTAL THYROIDECTOMY  02/18/2015    Current Outpatient Medications  Medication Sig Dispense Refill   acetaminophen (TYLENOL) 500 MG tablet Take 1,000 mg by mouth every 6 (six) hours as needed for moderate pain or headache.      albuterol (VENTOLIN HFA) 108 (90 Base) MCG/ACT inhaler 2 puffs. 8 g 11   ALPRAZolam (XANAX) 0.5 MG tablet Take 1 tablet by mouth every 6 (six) hours as needed.     APPLE CIDER VINEGAR PO Take 1 each by mouth daily. One gummy once a day     ARTIFICIAL TEARS 0.2-0.2-1 % SOLN See admin instructions.     ASPIRIN 81 PO 81 tablets every day by oral route.     atorvastatin (LIPITOR) 20 MG tablet Take 1 tablet by mouth daily.     Azelastine HCl 137 MCG/SPRAY SOLN daily.     b complex vitamins tablet Take 1 tablet by mouth daily.     Bayer Microlet Lancets lancets 4 each by Other route 4 (four) times daily.     BINAXNOW COVID-19 AG HOME TEST KIT Use as Directed on the Package     bisoprolol (ZEBETA) 5 MG tablet Take 1 tablet (5 mg total) by mouth daily. 90 tablet 3   Black Pepper-Turmeric (TURMERIC CURCUMIN) 04-999 MG CAPS See admin instructions. At bedtime     BREO ELLIPTA 200-25 MCG/ACT AEPB Inhale 1 puff into the lungs daily. 60 each 2   cetirizine (ZYRTEC) 10 MG tablet 1 tablet as needed     clobetasol cream (TEMOVATE) 3.01 % Apply 1 application topically 2 (two) times daily as needed.      cloNIDine (CATAPRES - DOSED IN MG/24 HR) 0.1 mg/24hr patch Place 1 patch (0.1 mg total) onto the skin once a week. Start in 2 weeks 4 patch 12   Continuous Blood Gluc Sensor (FREESTYLE LIBRE 3 SENSOR) MISC USE AS DIRECTED TO CHECK BLOOD SUGARS     cyclobenzaprine (FLEXERIL) 10 MG tablet Take 10 mg by mouth See admin instructions. Take 10 mg by mouth at bedtime. Take 10 mg by mouth every 8  hours as needed for muscle spasms     D3-50 50000 units capsule Take 50,000 Units by mouth every Saturday.  4   dapagliflozin propanediol (FARXIGA) 5 MG TABS tablet 1 tablet     diclofenac Sodium (VOLTAREN) 1 % GEL APPLY  2 GRAMS TO AFFECTED AREA OF FOOT 2-4 TIMES DAILY 100 g 0   dicyclomine (BENTYL) 10 MG capsule Take 1 capsule (10 mg total) by mouth 4 (four) times daily -  before meals and at bedtime. 120 capsule 10   diphenhydrAMINE (BENADRYL) 50 MG/ML injection      doxycycline (VIBRA-TABS) 100 MG tablet Take 1 tablet (100 mg total) by mouth 2 (two) times daily. 20 tablet 0   DULoxetine (CYMBALTA) 60 MG capsule Take 60 mg by mouth daily.     fluticasone (FLONASE) 50 MCG/ACT nasal spray Place 1 spray into both nostrils at bedtime.     furosemide (LASIX) 40 MG tablet Take 40 mg by mouth daily. As needed for SOB or leg swelling     gabapentin (NEURONTIN) 300 MG capsule Take 2 capsules (600 mg total) by mouth 3 (three) times daily. 540 capsule 1   GAMUNEX-C 20 GM/200ML SOLN      glucose blood (CONTOUR NEXT TEST) test strip 4 each by Other route 4 (four) times daily.     HYDROcodone-acetaminophen (NORCO) 7.5-325 MG tablet Take 1-2 tablets by mouth every 6 (six) hours as needed for moderate pain (for pain.). 40 tablet 0   hyoscyamine (LEVSIN SL) 0.125 MG SL tablet PLACE 2 TABLETS UNDER TONGUE EVERY 4 HOURS AS NEEDED WITH MEALS. 180 tablet 0   Insulin Human (INSULIN PUMP) SOLN Inject into the skin.     insulin regular human CONCENTRATED (HUMULIN R) 500 UNIT/ML injection Inject 2 Units into the skin See admin instructions. 2.0 units /hour 0500-2359, 1.95units 4098-1191 with carb ratio 1:50 for meal time bolus     ketoconazole (NIZORAL) 2 % cream Apply 1 application topically 2 (two) times daily as needed (for toes).      levothyroxine (SYNTHROID) 137 MCG tablet Take 2 tablets by mouth daily.     lisinopril (ZESTRIL) 10 MG tablet Take 1 tablet (10 mg total) by mouth daily. 90 tablet 3   metFORMIN  (GLUCOPHAGE) 1000 MG tablet 1 tablet with a meal     metroNIDAZOLE (METROGEL) 1 % gel Apply topically 2 (two) times daily.     Multiple Vitamin (MULTIVITAMIN) tablet Take 1 tablet by mouth at bedtime.      mupirocin ointment (BACTROBAN) 2 % APPLY  OINTMENT TOPICALLY TO AFFECTED AREA TWICE DAILY 22 g 0   NURTEC 75 MG TBDP Take 1 tablet by mouth daily.     ondansetron (ZOFRAN-ODT) 8 MG disintegrating tablet Take 1 tablet (8 mg total) by mouth every 6 (six) hours as needed. Please call [phone] to schedule an office visit for more refills 30 tablet 1   OZEMPIC, 1 MG/DOSE, 4 MG/3ML SOPN Inject into the skin.     pantoprazole (PROTONIX) 40 MG tablet Take 1 tablet (40 mg total) by mouth 2 (two) times daily. 180 tablet 4   PROCTOSOL HC 2.5 % rectal cream Apply 1 application topically 2 (two) times daily as needed for hemorrhoids.   2   sodium chloride 0.9 % infusion Inject into the vein.     Spacer/Aero-Holding Chambers (AEROCHAMBER MV) inhaler Use as instructed 1 each 0   Urea (UREA NAIL) 45 % GEL Apply 1 Application topically daily. 28 mL 1   VRAYLAR 1.5 MG capsule Take 1.5 mg by mouth daily.     ammonium lactate (LAC-HYDRIN) 12 % lotion APPLY TO ARMS   HANDS 2 TIMES A DAY ESPECIALLY AFTER BATH     PATADAY 0.1 % ophthalmic solution 1 drop into affected eye  Ophthalmic Twice a day for 30 days     No current facility-administered medications for this visit.    Allergies  Allergen Reactions   Latex Itching, Other (See Comments) and Rash    Blisters with prolonged contact   Penicillins Anaphylaxis, Shortness Of Breath, Swelling, Palpitations and Other (See Comments)    Has patient had a PCN reaction causing immediate rash, facial/tongue/throat swelling, SOB or lightheadedness with hypotension: Yes Has patient had a PCN reaction causing severe rash involving mucus membranes or skin necrosis: No Has patient had a PCN reaction that required hospitalization No Has patient had a PCN reaction  occurring within the last 10 years: No If all of the above answers are "NO", then may proceed with Cephalosporin use.    Clarithromycin Nausea And Vomiting and Other (See Comments)    Any antibiotic (doxyclycine, etc)   Adhesive [Tape] Itching and Other (See Comments)    Redness   Moxifloxacin Nausea And Vomiting and Other (See Comments)    Needs Zofran or Phenergan   Other Other (See Comments)    All antibiotics makes patient sick   Review of Systems negative except from HPI and PMH  Physical Exam:Ht _0  (1.676 m)   Wt (!) 301 lb 3.2 oz (136.6 kg)   SpO2 90%   BMI 48.61 kg/m   Ht _1  (1.676 m)   Wt (!) 301 lb 3.2 oz (136.6 kg)   SpO2 90%   BMI 48.61 kg/m  see orthostatics   Well developed and Morbidly obese in no acute distress HENT normal Neck supple with JVP-flat Clear Regular rate and rhythm, no  gallop No murmur Abd-soft with active BS No Clubbing cyanosis  edema Skin-warm and dry A & Oriented  Grossly normal sensory and motor function  ECG sinus at 87 Interval 16/09/37 Otherwise normal apart from poor R wave progression     Assessment and  Plan:   Orthostatic hypotension   Syncope   Diabetes   Morbid obesity  CIDP ( chronic inflammatory demyelinating polyneuropathy)  Hypertension  Reactive airways disease   Obstructive sleep apnea   Continues with dyspnea on exertion associated with tachypalpitations.  We will try more selective beta-blocker given the reactive airways intervally identified.  Will try her on bisoprolol 5 mg daily.  Once we have established a new baseline, we will start her on clonidine TTS 1 with hopes of controlling centrals sympathetic outflow and tachycardia and on her orthostatic vital signs today tachycardia was present in the absence of hypotension.  I will also reach out to Dr. Elsworth Soho concerning the possibility of pulmonary rehab.  CIDP appears to be progressing in some ways with foot weakness and neuropathy.         Marland Kitchen

## 2022-12-04 NOTE — Telephone Encounter (Addendum)
Patient called to request a zofran refill states she is in need of her medication and needs a call back by today.

## 2022-12-04 NOTE — Telephone Encounter (Signed)
Called patient and told her that I would refill her medication we just need to make an appointment. She proceed to get loud with me and tell me how she has been calling to get an appointment apparently but she was told that Dr Lyndel Safe has certain days and since she has infusion treatments she can't have certain days and that she has been denied getting an appointment this whole time. And how she needs to have 2 medications filled and not 1. So I made the appointment for her and sent in her medications to [facility] like she asked. Bentyl zofran and levsin sent in

## 2022-12-06 ENCOUNTER — Ambulatory Visit: Payer: Medicare Other | Admitting: Podiatry

## 2022-12-06 ENCOUNTER — Ambulatory Visit (HOSPITAL_COMMUNITY)
Admission: RE | Admit: 2022-12-06 | Discharge: 2022-12-06 | Disposition: A | Discharge status: Home / Self Care | Payer: Medicare Other | Source: Ambulatory Visit | Attending: Podiatry | Admitting: Podiatry

## 2022-12-06 ENCOUNTER — Ambulatory Visit: Payer: Medicare Other

## 2022-12-06 DIAGNOSIS — L97522 Non-pressure chronic ulcer of other part of left foot with fat layer exposed: Secondary | ICD-10-CM

## 2022-12-06 DIAGNOSIS — R0602 Shortness of breath: Secondary | ICD-10-CM | POA: Insufficient documentation

## 2022-12-06 DIAGNOSIS — T148XXA Other injury of unspecified body region, initial encounter: Secondary | ICD-10-CM

## 2022-12-06 LAB — CBC
Hematocrit: 41.4 % (ref 34.0–46.6)
Hemoglobin: 13.9 g/dL (ref 11.1–15.9)
MCH: 30.6 pg (ref 26.6–33.0)
MCHC: 33.6 g/dL (ref 31.5–35.7)
MCV: 91 fL (ref 79–97)
Platelets: 163 10*3/uL (ref 150–450)
RBC: 4.54 x10E6/uL (ref 3.77–5.28)
RDW: 14.6 % (ref 11.7–15.4)
WBC: 5.1 10*3/uL (ref 3.4–10.8)

## 2022-12-11 NOTE — Progress Notes (Signed)
Subjective: Chief Complaint  Patient presents with   Foot Problem    Patient is here for continuous treatment for ulcer on the left great hallux but also for a new blister on the left foot, 2nd digit, Patient stated the new blister appeared around 12/02/22     57 year old female presents the office today for follow-up evaluation of a wound on the left big toe, blister.  She still developed a blister on the second toe.  She gets some bleeding along the wound on the big toe.  No increased swelling or redness.  No fevers or chills that she reports.   Objective: AAO x3, NAD DP/PT pulses palpable bilaterally, CRT less than 3 seconds  LEFT: Along the lateral aspect of the left hallux where there was a larger portion of blister there is still a granular wound base is starting to scab over.  There is no surrounding cellulitis.  Old hemorrhagic blister which is now callus on the medial aspect of hallux without any underlying ulceration.  There is a new clear blister noted on the second toe which I drained today and there is no purulence.  There is some maceration in the interspaces as well. No pain with calf compression, swelling, warmth, erythema bilaterally  Assessment: Left hallux ulceration, ulcer second toe  Plan: -All treatment options discussed with the patient including all alternatives, risks, complications. -I think the blisters coming from the friction from the toes been wrapped.  I did drain the blister today when clear fluid came out.  Owners apply Betadine to this area as well as interdigitally for now to help with some moisture that she is having.  She can continue with the cream on the hallux.  Want her to use gauze to wrap tightly.  Also recommend going back into the surgical shoe/cam boot which she has both at home for offloading. -Monitor for any clinical signs or symptoms of infection and directed to call the office immediately should any occur or go to the ER.  *X-ray next  appointment   Trula Slade DPM

## 2022-12-17 ENCOUNTER — Other Ambulatory Visit: Payer: Self-pay | Admitting: Podiatry

## 2022-12-18 ENCOUNTER — Ambulatory Visit (INDEPENDENT_AMBULATORY_CARE_PROVIDER_SITE_OTHER): Payer: Medicare Other | Admitting: Podiatry

## 2022-12-18 ENCOUNTER — Ambulatory Visit (INDEPENDENT_AMBULATORY_CARE_PROVIDER_SITE_OTHER): Payer: Medicare Other

## 2022-12-18 DIAGNOSIS — L02612 Cutaneous abscess of left foot: Secondary | ICD-10-CM

## 2022-12-18 DIAGNOSIS — L97919 Non-pressure chronic ulcer of unspecified part of right lower leg with unspecified severity: Secondary | ICD-10-CM

## 2022-12-18 DIAGNOSIS — I83219 Varicose veins of right lower extremity with both ulcer of unspecified site and inflammation: Secondary | ICD-10-CM

## 2022-12-18 DIAGNOSIS — I83229 Varicose veins of left lower extremity with both ulcer of unspecified site and inflammation: Secondary | ICD-10-CM | POA: Diagnosis not present

## 2022-12-18 DIAGNOSIS — L97929 Non-pressure chronic ulcer of unspecified part of left lower leg with unspecified severity: Secondary | ICD-10-CM | POA: Diagnosis not present

## 2022-12-18 DIAGNOSIS — L03032 Cellulitis of left toe: Secondary | ICD-10-CM

## 2022-12-18 DIAGNOSIS — L97522 Non-pressure chronic ulcer of other part of left foot with fat layer exposed: Secondary | ICD-10-CM

## 2022-12-18 MED ORDER — DOXYCYCLINE HYCLATE 100 MG PO TABS: 100.0000 mg | ORAL_TABLET | Freq: Two times a day (BID) | ORAL | 0 refills | Status: DC

## 2022-12-18 NOTE — Progress Notes (Signed)
Subjective: Chief Complaint  Patient presents with   Foot Ulcer    Left foot, first 3 digits, minimul weeping, minimal bleeding    57 year old female presents the office today for follow-up evaluation of a wound on the left big toe, blister.  She can continue with Betadine and keeping the foot wrapped.  She still gets some bloody drainage mostly on the hallux but no pus.  No increase in swelling or redness and no red streaks.  She denies any fevers or chills.  The boot causing irritation of the leg.  She has no other concerns today.    Objective: AAO x3, NAD DP/PT pulses palpable bilaterally, CRT less than 3 seconds  LEFT: Along the lateral aspect of the right hallux granular wound there is 1 central area of fibrotic tissue.  It does not probe to bone although it is closed.  There is no drainage or pus noted today.  Ulceration with granular base some lateral aspect of the second toe as well.  There is some peeling skin present around this but there is no ascending cellulitis there is no fluctuance or crepitation.  No malodor.  The macerated tissue appears to be much improved. On the proximal aspect the leg with the boot did cause 1 superficial area skin breakdown but there is signs of infection.  No edema, erythema, drainage pus. No pain with calf compression, swelling, warmth, erythema bilaterally    Assessment: Left hallux ulceration, ulcer second toe  Plan: -All treatment options discussed with the patient including all alternatives, risks, complications. -X-rays were obtained and reviewed.  3 views of the foot were obtained.  On the lateral aspect of the hallux there is some cortical irregularity but compared to x-rays from several months ago about the same.  Concerned about possible early osteomyelitis of this area and this does correspond to the area of the wound.  There is no soft tissue emphysema. -I sharply debrided the wound lightly without any complications with an over 312 with  scalpel.  This was using Medihoney which I applied today and also dispensed to her.  Continue with wrapping the foot.  She can use Medihoney in the second toe.  Keep between the toes clean and dry. -Continue antibiotics and prescribed doxycycline -Surgical shoe for offloading of the cam boot was causing irritation. -Unfortunatly still at risk of amputation with this. Monitor for any clinical signs or symptoms of infection and directed to call the office immediately should any occur or go to the ER.   -I think the blisters coming from the friction from the toes been wrapped.  I did drain the blister today when clear fluid came out.  Owners apply Betadine to this area as well as interdigitally for now to help with some moisture that she is having.  She can continue with the cream on the hallux.  Want her to use gauze to wrap tightly.  Also recommend going back into the surgical shoe/cam boot which she has both at home for offloading. -Monitor for any clinical signs or symptoms of infection and directed to call the office immediately should any occur or go to the ER.  *X-ray next appointment   Trula Slade DPM

## 2022-12-31 ENCOUNTER — Ambulatory Visit: Payer: Medicare Other | Admitting: Podiatry

## 2022-12-31 ENCOUNTER — Ambulatory Visit (INDEPENDENT_AMBULATORY_CARE_PROVIDER_SITE_OTHER): Payer: Medicare Other

## 2022-12-31 DIAGNOSIS — L97521 Non-pressure chronic ulcer of other part of left foot limited to breakdown of skin: Secondary | ICD-10-CM

## 2023-01-01 ENCOUNTER — Other Ambulatory Visit: Payer: Self-pay | Admitting: Podiatry

## 2023-01-02 ENCOUNTER — Other Ambulatory Visit: Payer: Self-pay | Admitting: Gastroenterology

## 2023-01-02 NOTE — Progress Notes (Signed)
Subjective: Chief Complaint  Patient presents with   Foot Ulcer    Left foot, great hallux and 2nd digit, minimal drainage on the inside of great hallux     57 year old female presents the office today for follow-up evaluation of a wound on the left big toe, blister.  She states the wound on the toe is doing better.  She has very minimal bloody drainage on the bandage and no pus.  Overall the swelling is much improved particularly on the big toe.  She is keeping Betadine between the toes and Medihoney on the wounds.  No fevers or chills that she reports.  She has no other concerns today.     Objective: AAO x3, NAD DP/PT pulses palpable bilaterally, CRT less than 3 seconds  LEFT: Overall wounds are much improved.  There is still a granular wound present on the lateral aspect of the hallux.  The lateral aspect of the second toe on the IPJ is a granular wound with central fibrotic tissue.  There is no probing to bone to either the areas manage decreased edema.  There is no fluctuance or crepitation.  There is no malodor. No pain with calf compression, swelling, warmth, erythema bilaterally       Assessment: Left hallux ulceration, ulcer second toe  Plan: -All treatment options discussed with the patient including all alternatives, risks, complications. -X-rays were obtained and reviewed.  3 views of the foot were obtained.  There is no significant cortical changes compared to prior x-rays suggestive of osteomyelitis.  Soft tissue edema present. -I sharply debrided the wound lightly without any complications with an over 312 with scalpel.  This was using Medihoney which I applied today and also dispensed to her.  Continue with wrapping the foot.  She can use Medihoney in the second toe.  Keep between the toes clean and dry. -Surgical shoe for offloading of the cam boot was causing irritation. -Unfortunatly still at risk of amputation with this. Monitor for any clinical signs or symptoms of  infection and directed to call the office immediately should any occur or go to the ER.  Trula Slade DPM

## 2023-01-14 ENCOUNTER — Ambulatory Visit (INDEPENDENT_AMBULATORY_CARE_PROVIDER_SITE_OTHER): Payer: Medicare Other

## 2023-01-14 ENCOUNTER — Ambulatory Visit: Payer: Medicare Other | Admitting: Podiatry

## 2023-01-14 VITALS — BP 133/70 | HR 81

## 2023-01-14 DIAGNOSIS — L97522 Non-pressure chronic ulcer of other part of left foot with fat layer exposed: Secondary | ICD-10-CM

## 2023-01-15 ENCOUNTER — Other Ambulatory Visit: Payer: Self-pay

## 2023-01-15 ENCOUNTER — Ambulatory Visit: Payer: Medicare Other | Admitting: Gastroenterology

## 2023-01-15 ENCOUNTER — Other Ambulatory Visit (INDEPENDENT_AMBULATORY_CARE_PROVIDER_SITE_OTHER): Payer: Medicare Other

## 2023-01-15 ENCOUNTER — Other Ambulatory Visit: Payer: Self-pay | Admitting: Podiatry

## 2023-01-15 ENCOUNTER — Encounter: Payer: Self-pay | Admitting: Gastroenterology

## 2023-01-15 VITALS — BP 110/62 | HR 80 | Ht 66.0 in | Wt 294.6 lb

## 2023-01-15 DIAGNOSIS — K760 Fatty (change of) liver, not elsewhere classified: Secondary | ICD-10-CM | POA: Diagnosis not present

## 2023-01-15 DIAGNOSIS — K582 Mixed irritable bowel syndrome: Secondary | ICD-10-CM | POA: Diagnosis not present

## 2023-01-15 DIAGNOSIS — K219 Gastro-esophageal reflux disease without esophagitis: Secondary | ICD-10-CM

## 2023-01-15 DIAGNOSIS — Z86010 Personal history of colonic polyps: Secondary | ICD-10-CM

## 2023-01-15 LAB — COMPREHENSIVE METABOLIC PANEL
ALT: 48 U/L — ABNORMAL HIGH (ref 0–35)
AST: 47 U/L — ABNORMAL HIGH (ref 0–37)
Albumin: 3.8 g/dL (ref 3.5–5.2)
Alkaline Phosphatase: 71 U/L (ref 39–117)
BUN: 20 mg/dL (ref 6–23)
CO2: 25 mEq/L (ref 19–32)
Calcium: 9.7 mg/dL (ref 8.4–10.5)
Chloride: 98 mEq/L (ref 96–112)
Creatinine, Ser: 1 mg/dL (ref 0.40–1.20)
GFR: 63.49 mL/min (ref >60.00)
Glucose, Bld: 96 mg/dL (ref 70–99)
Potassium: 3.8 mEq/L (ref 3.5–5.1)
Sodium: 137 mEq/L (ref 135–145)
Total Bilirubin: 0.4 mg/dL (ref 0.2–1.2)
Total Protein: 10.1 g/dL — ABNORMAL HIGH (ref 6.0–8.3)

## 2023-01-15 LAB — CBC WITH DIFFERENTIAL/PLATELET
Basophils Absolute: 0 10*3/uL (ref 0.0–0.1)
Basophils Relative: 0.8 % (ref 0.0–3.0)
Eosinophils Absolute: 0.1 10*3/uL (ref 0.0–0.7)
Eosinophils Relative: 2.1 % (ref 0.0–5.0)
HCT: 42.2 % (ref 36.0–46.0)
Hemoglobin: 14.5 g/dL (ref 12.0–15.0)
Lymphocytes Relative: 44.2 % (ref 12.0–46.0)
Lymphs Abs: 1.8 10*3/uL (ref 0.7–4.0)
MCHC: 34.3 g/dL (ref 30.0–36.0)
MCV: 90.8 fl (ref 78.0–100.0)
Monocytes Absolute: 0.6 10*3/uL (ref 0.1–1.0)
Monocytes Relative: 13.7 % — ABNORMAL HIGH (ref 3.0–12.0)
Neutro Abs: 1.6 10*3/uL (ref 1.4–7.7)
Neutrophils Relative %: 39.2 % — ABNORMAL LOW (ref 43.0–77.0)
Platelets: 182 10*3/uL (ref 150.0–400.0)
RBC: 4.65 Mil/uL (ref 3.87–5.11)
RDW: 14.1 % (ref 11.5–15.5)
WBC: 4 10*3/uL (ref 4.0–10.5)

## 2023-01-15 LAB — PROTIME-INR
INR: 1.1 ratio — ABNORMAL HIGH (ref 0.8–1.0)
Prothrombin Time: 11.6 s (ref 9.6–13.1)

## 2023-01-15 MED ORDER — POLYETHYLENE GLYCOL 3350 17 G PO PACK: 17.0000 g | PACK | Freq: Every day | ORAL | 3 refills | Status: AC

## 2023-01-15 MED ORDER — PANTOPRAZOLE SODIUM 40 MG PO TBEC: 40.0000 mg | DELAYED_RELEASE_TABLET | Freq: Two times a day (BID) | ORAL | 4 refills | Status: DC

## 2023-01-15 MED ORDER — HYOSCYAMINE SULFATE 0.125 MG SL SUBL: SUBLINGUAL_TABLET | SUBLINGUAL | 11 refills | Status: DC

## 2023-01-15 MED ORDER — ONDANSETRON 8 MG PO TBDP: 8.0000 mg | ORAL_TABLET | Freq: Three times a day (TID) | ORAL | 4 refills | Status: DC | PRN

## 2023-01-15 MED ORDER — DICYCLOMINE HCL 10 MG PO CAPS: 10.0000 mg | ORAL_CAPSULE | Freq: Three times a day (TID) | ORAL | 11 refills | Status: DC | PRN

## 2023-01-15 NOTE — Patient Instructions (Signed)
We have sent the following medications to your pharmacy for you to pick up at your convenience:   Bentyl, Hyoscamine, Zofran, Protonix  Your provider has requested that you go to the basement level for lab work before leaving today. Press "B" on the elevator. The lab is located at the first door on the left as you exit the elevator.  Take Miralax 17g with 8 oz water daily.  You will be contacted by [place] in the next 2 days to arrange an Ultrasound.  The number on your caller ID will be [phone], please answer when they call.  If you have not heard from them in 2 days please call [phone] to schedule.    ______________________________________________________  If your blood pressure at your visit was 140/90 or greater, please contact your primary care physician to follow up on this.  _______________________________________________________  If you are age 56 or younger, your body mass index should be between 19-25. Your Body mass index is 47.55 kg/m. If this is out of the aformentioned range listed, please consider follow up with your Primary Care Provider.   ________________________________________________________  The [place] providers would like to encourage you to use [facility] to communicate with providers for non-urgent requests or questions.  Due to long hold times on the telephone, sending your provider a message by [facility] may be a faster and more efficient way to get a response.  Please allow 48 business hours for a response.  Please remember that this is for non-urgent requests.  _______________________________________________________  Due to recent changes in healthcare laws, you may see the results of your imaging and laboratory studies on MyChart before your provider has had a chance to review them.  We understand that in some cases there may be results that are confusing or concerning to you. Not all laboratory results come back in the same time  frame and the provider may be waiting for multiple results in order to interpret others.  Please give [place] 48 hours in order for your provider to thoroughly review all the results before contacting the office for clarification of your results.     It was a pleasure to see you today!  Jackquline Denmark, M.D.

## 2023-01-15 NOTE — Progress Notes (Signed)
IMPRESSION and PLAN:    #1. H/O Colonic Polyps. S/P colon 06/2018, fair prep, Bx- TAs.  #2. GERD with eso stricture s/p EGD with dil 18 mm TTS 06/2018 with significant resolution of dysphagia.  Prev Bx neg for EoE.  #3. Fatty Liver.  CT chest 04/2022 showing findings now suspicious for liver cirrhosis.  Has associated obesity, DM2, HLD. Dx 2001 on liver biopsy at [facility] showing stage I steatohepatitis.  H/O hepatic adenomas 1990s at [facility].  Being followed by serial ultrasounds. Neg autoimmune hepatitis profile, AMA, ceruloplasmin, alpha-1 antitrypsin, p-ANCA, acute hepatitis profile 09/2017.  #4. IBS-D, now with constipation. Neg random colonic bx for microscopic colitis 2008.   Plan:  -Continue Hyoscamine 0.125 mg S/L 2 tabs before meals and Q4hrs prn #120  11 RF -Continue Bentyl 10 mg p.o. BID and Q8hrs PRN #120, 11 refiils -Zofran '8mg'$  ODT Q8hrs prn #30, 4 refills -Miralax 17g po QD. -Continue Protonix '40mg'$  po bid #180, 4 refills -CBC, CMP, PT INR, AFP -Check HBsAb titer and HAV total Ab.  If neg, would recommend vaccination for A and B. -[place] abdo. -If still, CT AP -Rpt cologuard 11/2024. -She would like to hold off on EGD/colon at this time.      HPI:    Chief Complaint:   [name] is a 57 y.o. female  With PMH of DM2 req insulin, HTN, HLD, fatty liver, CIDP, fibromyalgia, dysautonomia, OSA on BIPAP, anxiety/depression, obesity, migraines thyroid cancer s/p resection with secondary hypothyroidism For follow-up visit Doing well from GI standpoint Here for medication refill.  No nausea, vomiting, heartburn, regurgitation, odynophagia. No significant diarrhea or constipation.  No melena or hematochezia. No unintentional weight loss. Chronic abdominal pain/discomfort,better with bentyl.  Followed by Dr Forde Dandy. Started on ozempic Nov 2023. Lost 309 to 294. Now mostly constipated, better with miralax. No diarrhea  Her swallowing is much better.  She would occasionally  have problems with solid food dysphagia if she tries to eat too fast.  No significant nausea/vomiting/postprandial abdominal bloating.  No fever chills or night sweats.   Had LFTs at nephrology which were "stable". [place] 08/2020-fatty liver without any obvious cirrhosis.  Gets around with a walker due to progressive neuropathy.  She would like to Hold off on any endoscopic procedures.   Wt Readings from Last 3 Encounters:  01/15/23 294 lb 9.6 oz (133.6 kg)  12/04/22 (!) 301 lb 3.2 oz (136.6 kg)  11/07/22 (!) 301 lb 3.2 oz (136.6 kg)    Past GI procedures: Neg cologuard 11/2021, rpt 3 yrs. EGD 06/2018 -distal eso stricture s/p dil -Negative biopsies for EOE.  Negative small bowel biopsies previously -Negative H. pylori.  Colon 06/2018 -Polyps s/p polypectomy. Bx- TA.  Does not want another. -Negative random colon biopsies for microscopic colitis.  2DE 01/2021: Nl EF 60-65% Past Medical History:  Diagnosis Date   Anginal pain ([place])    chest pain  heart cath done   Anxiety    Cancer [facility])    thyroid cancer   Chronic bronchitis ([place])    "get it q yr"   Chronic inflammatory demyelinating polyneuropathy ([facility])    Chronic kidney disease    Proteinuria   Dyspnea    sob with exertion   Early cataract    right   Fibromyalgia    GERD (gastroesophageal reflux disease)    Heart murmur    Hepatic adenoma    High cholesterol    History of gout    History  of hiatal hernia    History of kidney stones    Hypertension    Hypothyroidism    IBS (irritable bowel syndrome)    Iron deficiency anemia    "comes and goes"   Migraine    "@ least once/month" (02/18/2015)   Obesity    PONV (postoperative nausea and vomiting)    patch and anti nausea meds work well   Proteinuria    Spinal headache    Syncope    Tachycardia    Type II diabetes mellitus ([place])    pt has an insulin pump   Wears glasses     Current Outpatient Medications  Medication Sig Dispense Refill   acetaminophen  (TYLENOL) 500 MG tablet Take 1,000 mg by mouth every 6 (six) hours as needed for moderate pain or headache.      albuterol (VENTOLIN HFA) 108 (90 Base) MCG/ACT inhaler inhale 2 puffs DAILY 6.7 g 11   ALPRAZolam (XANAX) 0.5 MG tablet Take 1 tablet by mouth every 6 (six) hours as needed.     ammonium lactate (LAC-HYDRIN) 12 % lotion APPLY TO ARMS   HANDS 2 TIMES A DAY ESPECIALLY AFTER BATH     APPLE CIDER VINEGAR PO Take 1 each by mouth daily. One gummy once a day     ARTIFICIAL TEARS 0.2-0.2-1 % SOLN See admin instructions.     ASPIRIN 81 PO 81 tablets every day by oral route.     atorvastatin (LIPITOR) 20 MG tablet Take 1 tablet by mouth daily.     Azelastine HCl 137 MCG/SPRAY SOLN daily.     b complex vitamins tablet Take 1 tablet by mouth daily.     Bayer Microlet Lancets lancets 4 each by Other route 4 (four) times daily.     BINAXNOW COVID-19 AG HOME TEST KIT Use as Directed on the Package     bisoprolol (ZEBETA) 5 MG tablet Take 1 tablet (5 mg total) by mouth daily. 90 tablet 3   Black Pepper-Turmeric (TURMERIC CURCUMIN) 04-999 MG CAPS See admin instructions. At bedtime     BREO ELLIPTA 200-25 MCG/ACT AEPB Inhale 1 puff into the lungs daily. 60 each 2   cetirizine (ZYRTEC) 10 MG tablet 1 tablet as needed     clobetasol cream (TEMOVATE) 2.68 % Apply 1 application topically 2 (two) times daily as needed.      cloNIDine (CATAPRES - DOSED IN MG/24 HR) 0.1 mg/24hr patch Place 1 patch (0.1 mg total) onto the skin once a week. Start in 2 weeks 4 patch 12   Continuous Blood Gluc Sensor (FREESTYLE LIBRE 3 SENSOR) MISC USE AS DIRECTED TO CHECK BLOOD SUGARS     cyclobenzaprine (FLEXERIL) 10 MG tablet Take 10 mg by mouth See admin instructions. Take 10 mg by mouth at bedtime. Take 10 mg by mouth every 8 hours as needed for muscle spasms     D3-50 50000 units capsule Take 50,000 Units by mouth every Saturday.  4   dapagliflozin propanediol (FARXIGA) 5 MG TABS tablet 1 tablet     diclofenac Sodium  (VOLTAREN) 1 % GEL APPLY 2 GRAMS TO AFFECTED AREA OF FOOT 2-4 TIMES DAILY 100 g 0   dicyclomine (BENTYL) 10 MG capsule Take 1 capsule (10 mg total) by mouth 4 (four) times daily -  before meals and at bedtime. 120 capsule 10   diphenhydrAMINE (BENADRYL) 50 MG/ML injection      DULoxetine (CYMBALTA) 60 MG capsule Take 60 mg by mouth daily.  fluticasone (FLONASE) 50 MCG/ACT nasal spray Place 1 spray into both nostrils at bedtime.     furosemide (LASIX) 80 MG tablet Take 80 mg by mouth daily.     gabapentin (NEURONTIN) 300 MG capsule Take 2 capsules (600 mg total) by mouth 3 (three) times daily. 540 capsule 1   GAMUNEX-C 20 GM/200ML SOLN      glucose blood (CONTOUR NEXT TEST) test strip 4 each by Other route 4 (four) times daily.     HYDROcodone-acetaminophen (NORCO) 7.5-325 MG tablet Take 1-2 tablets by mouth every 6 (six) hours as needed for moderate pain (for pain.). 40 tablet 0   hyoscyamine (LEVSIN SL) 0.125 MG SL tablet PLACE 2 TABLETS UNDER TONGUE EVERY 4 HOURS AS NEEDED WITH MEALS. 120 tablet 11   Insulin Human (INSULIN PUMP) SOLN Inject into the skin.     insulin regular human CONCENTRATED (HUMULIN R) 500 UNIT/ML injection Inject 2 Units into the skin See admin instructions. 2.0 units /hour 0500-2359, 1.95units 0277-4128 with carb ratio 1:50 for meal time bolus     ketoconazole (NIZORAL) 2 % cream Apply 1 application topically 2 (two) times daily as needed (for toes).      levothyroxine (SYNTHROID) 137 MCG tablet Take 2 tablets by mouth daily.     lisinopril (ZESTRIL) 10 MG tablet Take 1 tablet (10 mg total) by mouth daily. 90 tablet 3   metFORMIN (GLUCOPHAGE) 1000 MG tablet 1 tablet with a meal     metroNIDAZOLE (METROGEL) 1 % gel Apply topically 2 (two) times daily.     Multiple Vitamin (MULTIVITAMIN) tablet Take 1 tablet by mouth at bedtime.      mupirocin ointment (BACTROBAN) 2 % APPLY  OINTMENT TOPICALLY TO AFFECTED AREA TWICE DAILY 22 g 0   NURTEC 75 MG TBDP Take 1 tablet by mouth  daily.     ondansetron (ZOFRAN-ODT) 8 MG disintegrating tablet Take 1 tablet (8 mg total) by mouth every 6 (six) hours as needed. Please call [phone] to schedule an office visit for more refills 30 tablet 0   OZEMPIC, 1 MG/DOSE, 4 MG/3ML SOPN Inject into the skin.     pantoprazole (PROTONIX) 40 MG tablet Take 1 tablet (40 mg total) by mouth 2 (two) times daily. 180 tablet 1   PATADAY 0.1 % ophthalmic solution 1 drop into affected eye Ophthalmic Twice a day for 30 days     PROCTOSOL HC 2.5 % rectal cream Apply 1 application topically 2 (two) times daily as needed for hemorrhoids.   2   sodium chloride 0.9 % infusion Inject into the vein.     Spacer/Aero-Holding Chambers (AEROCHAMBER MV) inhaler Use as instructed 1 each 0   Urea (UREA NAIL) 45 % GEL Apply 1 Application topically daily. 28 mL 1   VRAYLAR 1.5 MG capsule Take 1.5 mg by mouth daily.     No current facility-administered medications for this visit.    Past Surgical History:  Procedure Laterality Date   ABDOMINAL HYSTERECTOMY  ~2012   "lap"   ANKLE RECONSTRUCTION Left 03/29/2016   Procedure: LEFT ANKLE LATERAL LIGAMENT RECONSTRUCTION AND PERONEAL TENDON REPAIR OR TENOLYSIS;  Surgeon: Wylene Simmer, MD;  Location: [facility];  Service: Orthopedics;  Laterality: Left;   BACK SURGERY     CARDIAC CATHETERIZATION  04/2009   "clean"   COLONOSCOPY  01/12/2015   Colonic polyps status post polypectomy, small internal hemorrhoids. Otherwise normal. Bx: mild chronic gastritis.   CYSTOSCOPY WITH URETEROSCOPY, STONE BASKETRY AND STENT PLACEMENT  ~ 2006  ESOPHAGOGASTRODUODENOSCOPY  07/21/2015   Esophageal stricture status post esophageal dilatation. Mild gastritis.   insulin pump     U 500   IR IMAGING GUIDED PORT INSERTION  09/22/2018   IRRIGATION AND DEBRIDEMENT KNEE Left 04/03/2018   Procedure: Arthroscopic versus open irrigation and debridement  left knee;  Surgeon: Nicholes Stairs, MD;  Location: [facility];  Service: Orthopedics;   Laterality: Left;   LAPAROSCOPIC CHOLECYSTECTOMY  05/2004   LUMBAR [place] SURGERY  08/2011   SURAL NERVE BX Left 09/30/2017   Procedure: LEFT SURAL NERVE AND MUSCLE BIOPSY;  Surgeon: Newman Pies, MD;  Location: [facility];  Service: Neurosurgery;  Laterality: Left;  LEFT SURAL NERVE AND MUSCLE BIOPSY   THYROIDECTOMY N/A 02/18/2015   Procedure: TOTAL THYROIDECTOMY;  Surgeon: [name], MD;  Location: [facility];  Service: General;  Laterality: N/A;   TOTAL THYROIDECTOMY  02/18/2015    Family History  Problem Relation Age of Onset   Hypertension Mother    Emphysema Mother    AAA (abdominal aortic aneurysm) Mother    Hypertension Father    Coronary artery disease Father    Cancer - Prostate Father    Renal Disease Father    Hypertension Brother    Colon cancer Neg Hx    Esophageal cancer Neg Hx    Liver cancer Neg Hx    Pancreatic cancer Neg Hx    Rectal cancer Neg Hx    Stomach cancer Neg Hx     Social History   Tobacco Use   Smoking status: Never   Smokeless tobacco: Never  Vaping Use   Vaping Use: Never used  Substance Use Topics   Alcohol use: No   Drug use: No    Allergies  Allergen Reactions   Latex Itching, Other (See Comments) and Rash    Blisters with prolonged contact   Penicillins Anaphylaxis, Shortness Of Breath, Swelling, Palpitations and Other (See Comments)    Has patient had a PCN reaction causing immediate rash, facial/tongue/throat swelling, SOB or lightheadedness with hypotension: Yes Has patient had a PCN reaction causing severe rash involving mucus membranes or skin necrosis: No Has patient had a PCN reaction that required hospitalization No Has patient had a PCN reaction occurring within the last 10 years: No If all of the above answers are "NO", then may proceed with Cephalosporin use.    Clarithromycin Nausea And Vomiting and Other (See Comments)    Any antibiotic (doxyclycine, etc)   Adhesive [Tape] Itching and Other (See Comments)    Redness    Moxifloxacin Nausea And Vomiting and Other (See Comments)    Needs Zofran or Phenergan   Other Other (See Comments)    All antibiotics makes patient sick     Review of Systems: All systems reviewed and negative except where noted in HPI.    Physical Exam:     BP 110/62   Pulse 80   Ht '5\' 6"'$  (1.676 m)   Wt 294 lb 9.6 oz (133.6 kg)   BMI 47.55 kg/m   Filed Weights   01/15/23 0835  Weight: 294 lb 9.6 oz (133.6 kg)    GENERAL:  Alert, oriented, cooperative, not in acute distress. PSYCH: :Pleasant, normal mood and affect. HEENT:  conjunctiva pink, mucous membranes moist, neck supple without masses. No jaundice. CARDIAC:  S1 S2 normal. No murmers. PULM: Normal respiratory effort, lungs CTA bilaterally, no wheezing. ABDOMEN: Inspection: No visible peristalsis, no abnormal pulsations, skin normal.  Palpation/percussion: Soft, nontender, nondistended, no rigidity, no  abnormal dullness to percussion, no hepatosplenomegaly and no palpable abdominal masses.  Auscultation: Normal bowel sounds, no abdominal bruits. Rectal exam: Deferred SKIN:  turgor, no lesions seen. NEURO: Alert and oriented x 3, no focal neurologic deficits.   Data Reviewed: I have personally reviewed following labs and imaging studies     Latest Ref Rng & Units 12/06/2022   11:04 AM 09/22/2018    7:37 AM 04/03/2018   10:13 AM  CBC  WBC 3.4 - 10.8 x10E3/uL 5.1  4.5  9.7   Hemoglobin 11.1 - 15.9 g/dL 13.9  14.1  11.7   Hematocrit 34.0 - 46.6 % 41.4  43.1  36.3   Platelets 150 - 450 x10E3/uL 163  177  435       Latest Ref Rng & Units 09/26/2017    9:44 AM 07/01/2009    9:34 AM  Hepatic Function  Total Protein 6.5 - 8.1 g/dL 6.9  6.0   Albumin 3.5 - 5.0 g/dL 4.1  3.2   AST 15 - 41 U/L 105  47   ALT 14 - 54 U/L 71  32   Alk Phosphatase 38 - 126 U/L 83  39   Total Bilirubin 0.3 - 1.2 mg/dL 0.5  1.1        Demetrious Rainford,MD 01/15/2023, 8:36 AM   CC Reynold Bowen, MD

## 2023-01-16 NOTE — Progress Notes (Signed)
Subjective: Chief Complaint  Patient presents with   Foot Ulcer    Patient states the toe is looking better just has some swelling in the toes with some drainage.     57 year old female presents the office today for follow-up evaluation of a wound on the left big toe, blister.  She is completely because it is soft and no pus.  She has been keeping the Medihoney on the wound.  She denies any increase in swelling or redness.  She has no pain although she does have neuropathy.  Denies any fevers or chills.    Objective: AAO x3, NAD DP/PT pulses palpable bilaterally, CRT less than 3 seconds  LEFT: Granular wound noted on the lateral aspect of the left hallux without any probing or tunneling.  Mild hyperkeratotic periwound.  Movement second toe appears to be most healed.  There is no probing to bone, and or tunneling.  There is no fluctuance or crepitation of the wounds and there is no drainage or pus noted today. No pain with calf compression, swelling, warmth, erythema bilaterally    Assessment: Left hallux ulceration, ulcer second toe  Plan: -All treatment options discussed with the patient including all alternatives, risks, complications. -X-rays were obtained and reviewed.  3 views of the foot were obtained.  There is no significant cortical changes compared to prior x-rays suggestive of osteomyelitis.  Soft tissue edema present.  No significant changes. -No acute hypergranular tissue on the hallux ulceration.  No bleeding.  Continue with Medihoney on the wound as well as pain interdigitally.  Discussed with continued she can use betadine on the wounds and not the medihoney if needed.  -Surgical shoe for offloading of the cam boot was causing irritation. -Unfortunatly still at risk of amputation with this. Monitor for any clinical signs or symptoms of infection and directed to call the office immediately should any occur or go to the ER.  Trula Slade DPM

## 2023-01-17 LAB — HEPATITIS B SURFACE ANTIBODY,QUALITATIVE: Hep B S Ab: REACTIVE — AB

## 2023-01-17 LAB — AFP TUMOR MARKER: AFP-Tumor Marker: 1.4 ng/mL

## 2023-01-17 LAB — HEPATITIS A ANTIBODY, TOTAL: Hepatitis A AB,Total: REACTIVE — AB

## 2023-01-24 ENCOUNTER — Ambulatory Visit (HOSPITAL_COMMUNITY)
Admission: RE | Admit: 2023-01-24 | Discharge: 2023-01-24 | Disposition: A | Discharge status: Home / Self Care | Payer: Medicare Other | Source: Ambulatory Visit | Attending: Gastroenterology | Admitting: Gastroenterology

## 2023-01-24 DIAGNOSIS — Z86010 Personal history of colonic polyps: Secondary | ICD-10-CM | POA: Diagnosis present

## 2023-01-24 DIAGNOSIS — K582 Mixed irritable bowel syndrome: Secondary | ICD-10-CM | POA: Diagnosis present

## 2023-01-24 DIAGNOSIS — K760 Fatty (change of) liver, not elsewhere classified: Secondary | ICD-10-CM | POA: Diagnosis present

## 2023-01-24 DIAGNOSIS — K219 Gastro-esophageal reflux disease without esophagitis: Secondary | ICD-10-CM | POA: Diagnosis present

## 2023-01-25 ENCOUNTER — Telehealth: Payer: Self-pay | Admitting: Primary Care

## 2023-01-25 NOTE — Telephone Encounter (Signed)
Patient states she spoke with insurance and they are requesting a prior authorization through cover my meds. Prior auth team can you please advise? Pt states she has been receiving generic for breo, will her insurance cover her the brand name Breo? Please advise

## 2023-01-28 ENCOUNTER — Other Ambulatory Visit (HOSPITAL_COMMUNITY): Payer: Self-pay

## 2023-01-28 NOTE — Telephone Encounter (Signed)
Per test claim Armanda Heritage seems to be covered, just showing a refill too soon claim until 03/01 due to last fill on 02/07.

## 2023-01-29 NOTE — Telephone Encounter (Signed)
Spoke to patient advised insurance still prefers Armanda Heritage and she should be able to get next prescription on March 1st. Advised pt to call back with any questions or concerns. Nothing further needed.

## 2023-01-30 ENCOUNTER — Other Ambulatory Visit: Payer: Self-pay | Admitting: Podiatry

## 2023-01-31 ENCOUNTER — Other Ambulatory Visit: Payer: Self-pay | Admitting: Primary Care

## 2023-02-04 ENCOUNTER — Ambulatory Visit: Payer: Medicare Other | Admitting: Podiatry

## 2023-02-04 DIAGNOSIS — L97522 Non-pressure chronic ulcer of other part of left foot with fat layer exposed: Secondary | ICD-10-CM

## 2023-02-04 NOTE — Progress Notes (Signed)
Subjective: Chief Complaint  Patient presents with   Foot Ulcer    Return for toe ulcer, left in 2-3 weeks    57 year old female presents the office today for follow-up evaluation of a wound on the left big toe, blister.  She states that overall the toe is doing better.  There is 1 small spot on the lateral aspect of the great toe but no drainage or pus.  No increase in swelling or redness.  No fever chills.  No other concerns.   Objective: AAO x3, NAD DP/PT pulses palpable bilaterally, CRT less than 3 seconds  LEFT: Granular wound noted on the lateral aspect of the left hallux without any probing or tunneling.  There is hypergranulation tissue present.  Measures about 0.3 x 0.3 cm and has Erythema, ascending cellulitis.  There is no fluctuance or crepitation.  No malodor.   Preulcerative areas noted along the lateral third toe without any skin breakdown.  There is no blister formation today.  No signs of infection.   No pain with calf compression, swelling, warmth, erythema bilaterally   Assessment: Left hallux ulceration  Plan: -All treatment options discussed with the patient including all alternatives, risks, complications. -I applied silver nitrate to cauterize the area given hypergranulation tissue on the hallux.  Silvadene was applied followed by dressing.  Continue daily dressing changes. -Offloading to the other toes as areas are preulcerative.  Dispensed silicone toe separators. -Monitor for any clinical signs or symptoms of infection and directed to call the office immediately should any occur or go to the ER.  No follow-ups on file.  Trula Slade DPM

## 2023-02-08 ENCOUNTER — Encounter (HOSPITAL_BASED_OUTPATIENT_CLINIC_OR_DEPARTMENT_OTHER): Payer: Self-pay | Admitting: Pulmonary Disease

## 2023-02-08 ENCOUNTER — Ambulatory Visit (HOSPITAL_BASED_OUTPATIENT_CLINIC_OR_DEPARTMENT_OTHER): Payer: Medicare Other | Admitting: Pulmonary Disease

## 2023-02-08 VITALS — BP 116/64 | HR 81 | Temp 97.5°F | Ht 66.0 in | Wt 294.8 lb

## 2023-02-08 DIAGNOSIS — J4530 Mild persistent asthma, uncomplicated: Secondary | ICD-10-CM

## 2023-02-08 DIAGNOSIS — J9611 Chronic respiratory failure with hypoxia: Secondary | ICD-10-CM | POA: Insufficient documentation

## 2023-02-08 DIAGNOSIS — G4733 Obstructive sleep apnea (adult) (pediatric): Secondary | ICD-10-CM | POA: Diagnosis not present

## 2023-02-08 NOTE — Assessment & Plan Note (Signed)
Based on her desaturation at home, I would like her to obtain oxygen. We will check ambulatory saturation today and try to get POC for her.  Hope is that this will help her do more.desaturation may be on the basis of diaphragm paralysis, obesity and HFpEF. She remains on Lasix 80 mg daily

## 2023-02-08 NOTE — Assessment & Plan Note (Signed)
Continue Breo Use albuterol only for rescue or wheezing not for shortness of breath that gets relieved with Resting.  Concern for tachyarrhythmias with too much albuterol

## 2023-02-08 NOTE — Progress Notes (Signed)
Subjective:    Patient ID: [name], female    DOB: 1966/01/16, 57 y.o.   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      PMH -CIDP as above, dysautonomia Caryl Comes) , orthostatic hypotension with syncope and multiple falls, proteinuria/CKD Hollie Salk ) , hypertension, thyroid cancer status post thyroidectomy , fibromyalgia , IDDM on insulin pump   Chief Complaint  Patient presents with   Follow-up    Pt said she has been doing about the same since last visit. States she still has issues with SOB.   Accompanied by her husband clefting. She has lost 7 pounds since her last visit 3 months ago.  She is on Ozempic She remains compliant with her BiPAP she has found a better fitting mask and feels like she can do away with the ramp function she prefers a higher pressure she shows me pictures of pulse oximeter readings of 82 and 85% and reports that she is short of breath walking to the mailbox and back. She is frustrated that whenever we have checked her we have not found her oxygen level to drop and wonders if oxygen would help.  She is afraid of going to the store because of desaturations. Cardiology visit was reviewed, blood pressure medicines have been changed from Toprol to bisoprolol, clonidine patch was added and Prinivil dose was decreased, blood pressure is much better controlled.  Insurance requested change from generic to brand [employer] -she wonders if this will still be helpful. Albuterol MDI only increases her heart rate more.  She is on immunoglobulin infusions every 3 weeks and feels better the first 2 weeks Did not desat below 90% on walking  Significant tests/ events reviewed   PAP titration 08/14/22 biPAP 24/20  , CPAP was titrated to 18 cm but could not toelrate due to persistent hypoxia, RERAs & hypopneas    ONO on CPAP on 06/03/2022 showed patient spent 8 minutes with SPO2 less than 88%. SPO2 low 82% with average 91%.     6-MWT 04/16/2022, baseline SPO2 low was 97% on room air. After 2-minute post walk SPO2 was 96% on room air.    HST 01/2021 mild OSA, AHI 7/h 11/2020 Sniff test decreased excursion of the right hemidiaphragm  PFT 12/2020 moderate reversible obstruction, ratio 85, FEV1 57%, FVC 62%, significant bronchodilator response improved by 0.54 to 81%, TLC 80%, DLCO 108%   Chest x-ray 11/18/2020 shows low volumes elevated right hemidiaphragm. Chest x-ray 02/2015 also shows lower volumes and similar mild elevation of right hemidiaphragm  Review of Systems neg for any significant sore throat, dysphagia, itching, sneezing, nasal congestion or excess/ purulent secretions, fever, chills, sweats, unintended wt loss, pleuritic or exertional cp, hempoptysis, orthopnea pnd or change in chronic leg swelling. Also denies presyncope, palpitations, heartburn, abdominal pain, nausea, vomiting, diarrhea or change in bowel or urinary habits, dysuria,hematuria, rash, arthralgias, visual complaints, headache, numbness weakness or ataxia.     Objective:   Physical Exam   Gen. Pleasant, obese, in no distress ENT - no lesions, no post nasal drip Neck: No JVD, no thyromegaly, no carotid bruits Lungs: no use of accessory muscles, no dullness to percussion, decreased without rales or rhonchi  Cardiovascular:  Rhythm regular, heart sounds  normal, no murmurs or gallops, no peripheral edema Musculoskeletal: No deformities, no cyanosis or clubbing , no tremors        Assessment & Plan:

## 2023-02-08 NOTE — Assessment & Plan Note (Signed)
BiPAP download was reviewed which shows excellent control of events on auto BiPAP settings with average settings of 12/8 and maximum settings of 13/9.  She is very compliant, BiPAP is only helped improve her daytime somnolence and fatigue. She does not need the ramp function anymore and she can discontinue  Weight loss encouraged, compliance with goal of at least 4-6 hrs every night is the expectation. Advised against medications with sedative side effects Cautioned against driving when sleepy - understanding that sleepiness will vary on a day to day basis

## 2023-02-08 NOTE — Patient Instructions (Signed)
X Amb sat  BiPAP is working well - OK to dc ramp   X Referral to cardio -pulm rehab in [place]

## 2023-02-12 ENCOUNTER — Telehealth (HOSPITAL_COMMUNITY): Payer: Self-pay

## 2023-02-12 NOTE — Telephone Encounter (Signed)
Received referral from Dr. Elsworth Soho for this pt to participate in Pulmonary Rehab with the diagnosis of chronic respiratory failure with hypoxia and asthma in adult, mild persistent, uncomplicated. Clinical review of pt follow up appt on 02/08/2023 Pulmonary office note. Pt appropriate for scheduling for Pulmonary rehab. Will forward to support staff for scheduling and verification of insurance eligibility/benefits with pt consent.   Janine Ores, RN, BSN Cardiac and Pulmonary Rehab

## 2023-02-19 ENCOUNTER — Telehealth (HOSPITAL_COMMUNITY): Payer: Self-pay

## 2023-02-19 ENCOUNTER — Encounter (HOSPITAL_COMMUNITY): Payer: Self-pay

## 2023-02-19 NOTE — Telephone Encounter (Signed)
Called patient to see if she was interested in participating in the Pulmonary Rehab Program. Patient stated yes. Patient will come in for orientation on 02/20/23 @ 10:30AM and will attend the 1:15PM exercise class.

## 2023-02-20 ENCOUNTER — Encounter (HOSPITAL_COMMUNITY)
Admission: RE | Admit: 2023-02-20 | Discharge: 2023-02-20 | Disposition: A | Discharge status: Home / Self Care | Payer: Medicare Other | Source: Ambulatory Visit | Attending: Pulmonary Disease | Admitting: Pulmonary Disease

## 2023-02-20 ENCOUNTER — Encounter (HOSPITAL_COMMUNITY): Payer: Self-pay

## 2023-02-20 VITALS — BP 102/60 | HR 81 | Ht 66.0 in | Wt 298.1 lb

## 2023-02-20 DIAGNOSIS — J4530 Mild persistent asthma, uncomplicated: Secondary | ICD-10-CM | POA: Diagnosis present

## 2023-02-20 DIAGNOSIS — J9611 Chronic respiratory failure with hypoxia: Secondary | ICD-10-CM

## 2023-02-20 NOTE — Progress Notes (Deleted)
Pulmonary Individual Treatment Plan  Patient Details  Name: [name] MRN: [id number] Date of Birth: 02/13/1966 Referring Provider:   April Manson Pulmonary Rehab Walk Test from 02/20/2023 in [facility] for Heart, Vascular, & [place]  Referring Provider Dr. Elsworth Soho       Initial Encounter Date:  Flowsheet Row Pulmonary Rehab Walk Test from 02/20/2023 in [facility] for Heart, Vascular, & Lung Health  Date 02/20/23       Visit Diagnosis: Chronic respiratory failure with hypoxia ([place])  Mild persistent asthma without complication  Patient's Home Medications on Admission:   Current Outpatient Medications:    acetaminophen (TYLENOL) 500 MG tablet, Take 1,000 mg by mouth every 6 (six) hours as needed for moderate pain or headache. , Disp: , Rfl:    albuterol (VENTOLIN HFA) 108 (90 Base) MCG/ACT inhaler, inhale 2 puffs DAILY, Disp: 6.7 g, Rfl: 11   ALPRAZolam (XANAX) 0.5 MG tablet, Take 1 tablet by mouth every 6 (six) hours as needed., Disp: , Rfl:    ammonium lactate (LAC-HYDRIN) 12 % lotion, APPLY TO ARMS   HANDS 2 TIMES A DAY ESPECIALLY AFTER BATH, Disp: , Rfl:    APPLE CIDER VINEGAR PO, Take 1 each by mouth daily. One gummy once a day, Disp: , Rfl:    ARTIFICIAL TEARS 0.2-0.2-1 % SOLN, See admin instructions., Disp: , Rfl:    ASPIRIN 81 PO, 81 tablets every day by oral route., Disp: , Rfl:    atorvastatin (LIPITOR) 20 MG tablet, Take 1 tablet by mouth daily., Disp: , Rfl:    Azelastine HCl 137 MCG/SPRAY SOLN, daily., Disp: , Rfl:    b complex vitamins tablet, Take 1 tablet by mouth daily., Disp: , Rfl:    Bayer Microlet Lancets lancets, 4 each by Other route 4 (four) times daily., Disp: , Rfl:    Bepotastine Besilate 1.5 % SOLN, 1 drop 2 (two) times daily., Disp: , Rfl:    bisoprolol (ZEBETA) 5 MG tablet, Take 1 tablet (5 mg total) by mouth daily., Disp: 90 tablet, Rfl: 3   Black Pepper-Turmeric (TURMERIC CURCUMIN) 04-999 MG  CAPS, See admin instructions. At bedtime, Disp: , Rfl:    cetirizine (ZYRTEC) 10 MG tablet, 1 tablet as needed, Disp: , Rfl:    clobetasol cream (TEMOVATE) [id number] %, Apply 1 application topically 2 (two) times daily as needed. , Disp: , Rfl:    cloNIDine (CATAPRES - DOSED IN MG/24 HR) 0.1 mg/24hr patch, Place 1 patch (0.1 mg total) onto the skin once a week. Start in 2 weeks, Disp: 4 patch, Rfl: 12   Continuous Blood Gluc Sensor (FREESTYLE LIBRE 3 SENSOR) MISC, USE AS DIRECTED TO CHECK BLOOD SUGARS, Disp: , Rfl:    cyclobenzaprine (FLEXERIL) 10 MG tablet, Take 10 mg by mouth See admin instructions. Take 10 mg by mouth at bedtime. Take 10 mg by mouth every 8 hours as needed for muscle spasms, Disp: , Rfl:    D3-50 50000 units capsule, Take 50,000 Units by mouth every Saturday., Disp: , Rfl: 4   dapagliflozin propanediol (FARXIGA) 5 MG TABS tablet, 1 tablet, Disp: , Rfl:    diclofenac Sodium (VOLTAREN) 1 % GEL, APPLY 2 GRAMS TO AFFECTED AREA OF FOOT 2-4 TIMES DAILY, Disp: 100 g, Rfl: 0   dicyclomine (BENTYL) 10 MG capsule, Take 1 capsule (10 mg total) by mouth every 8 (eight) hours as needed for spasms., Disp: 120 capsule, Rfl: 11   diphenhydrAMINE (BENADRYL) 50 MG/ML injection, ,  Disp: , Rfl:    DULoxetine (CYMBALTA) 60 MG capsule, Take 60 mg by mouth daily., Disp: , Rfl:    fluticasone (FLONASE) 50 MCG/ACT nasal spray, Place 1 spray into both nostrils at bedtime., Disp: , Rfl:    fluticasone furoate-vilanterol (BREO ELLIPTA) 200-25 MCG/ACT AEPB, Inhale 1 puff into the lungs daily., Disp: 60 each, Rfl: 2   furosemide (LASIX) 80 MG tablet, Take 80 mg by mouth daily., Disp: , Rfl:    gabapentin (NEURONTIN) 300 MG capsule, Take 2 capsules (600 mg total) by mouth 3 (three) times daily., Disp: 540 capsule, Rfl: 1   GAMUNEX-C 20 GM/200ML SOLN, , Disp: , Rfl:    glucose blood (CONTOUR NEXT TEST) test strip, 4 each by Other route 4 (four) times daily., Disp: , Rfl:    HYDROcodone-acetaminophen (NORCO) 7.5-325  MG tablet, Take 1-2 tablets by mouth every 6 (six) hours as needed for moderate pain (for pain.)., Disp: 40 tablet, Rfl: 0   hyoscyamine (LEVSIN SL) 0.125 MG SL tablet, PLACE 2 TABLETS UNDER TONGUE EVERY 4 HOURS AS NEEDED WITH MEALS., Disp: 120 tablet, Rfl: 11   Insulin Human (INSULIN PUMP) SOLN, Inject into the skin., Disp: , Rfl:    insulin regular human CONCENTRATED (HUMULIN R) 500 UNIT/ML injection, Inject 2 Units into the skin See admin instructions. 2.0 units /hour 0500-2359, 1.95units [id number] with carb ratio 1:50 for meal time bolus, Disp: , Rfl:    ketoconazole (NIZORAL) 2 % cream, Apply 1 application topically 2 (two) times daily as needed (for toes). , Disp: , Rfl:    levothyroxine (SYNTHROID) 137 MCG tablet, Take 2 tablets by mouth daily., Disp: , Rfl:    lisinopril (ZESTRIL) 10 MG tablet, Take 1 tablet (10 mg total) by mouth daily., Disp: 90 tablet, Rfl: 3   metFORMIN (GLUCOPHAGE) 1000 MG tablet, 1 tablet with a meal, Disp: , Rfl:    metroNIDAZOLE (METROGEL) 1 % gel, Apply topically 2 (two) times daily., Disp: , Rfl:    Multiple Vitamin (MULTIVITAMIN) tablet, Take 1 tablet by mouth at bedtime. , Disp: , Rfl:    mupirocin ointment (BACTROBAN) 2 %, APPLY  OINTMENT TOPICALLY TO AFFECTED AREA TWICE DAILY, Disp: 22 g, Rfl: 0   NURTEC 75 MG TBDP, Take 1 tablet by mouth daily., Disp: , Rfl:    ondansetron (ZOFRAN-ODT) 8 MG disintegrating tablet, Take 1 tablet (8 mg total) by mouth every 8 (eight) hours as needed. Please call [phone] to schedule an office visit for more refills, Disp: 30 tablet, Rfl: 4   OZEMPIC, 1 MG/DOSE, 4 MG/3ML SOPN, Inject into the skin., Disp: , Rfl:    pantoprazole (PROTONIX) 40 MG tablet, Take 1 tablet (40 mg total) by mouth 2 (two) times daily., Disp: 180 tablet, Rfl: 4   PATADAY 0.1 % ophthalmic solution, 1 drop into affected eye Ophthalmic Twice a day for 30 days, Disp: , Rfl:    polyethylene glycol (MIRALAX / GLYCOLAX) 17 g packet, Take 17 g by mouth daily.,  Disp: 90 packet, Rfl: 3   PROCTOSOL HC 2.5 % rectal cream, Apply 1 application topically 2 (two) times daily as needed for hemorrhoids. , Disp: , Rfl: 2   sodium chloride 0.9 % infusion, Inject into the vein., Disp: , Rfl:    Spacer/Aero-Holding Chambers (AEROCHAMBER MV) inhaler, Use as instructed, Disp: 1 each, Rfl: 0   Urea (UREA NAIL) 45 % GEL, Apply 1 Application topically daily., Disp: 28 mL, Rfl: 1   VRAYLAR 1.5 MG capsule, Take 1.5 mg by  mouth daily., Disp: , Rfl:   Past Medical History: Past Medical History:  Diagnosis Date   Anginal pain ([place])    chest pain  heart cath done   Anxiety    Cancer [facility])    thyroid cancer   Chronic bronchitis ([place])    "get it q yr"   Chronic inflammatory demyelinating polyneuropathy ([facility])    Chronic kidney disease    Proteinuria   Dyspnea    sob with exertion   Early cataract    right   Fibromyalgia    GERD (gastroesophageal reflux disease)    Heart murmur    Hepatic adenoma    High cholesterol    History of gout    History of hiatal hernia    History of kidney stones    Hypertension    Hypothyroidism    IBS (irritable bowel syndrome)    Iron deficiency anemia    "comes and goes"   Migraine    "@ least once/month" (02/18/2015)   Obesity    PONV (postoperative nausea and vomiting)    patch and anti nausea meds work well   Proteinuria    Spinal headache    Syncope    Tachycardia    Type II diabetes mellitus ([place])    pt has an insulin pump   Wears glasses     Tobacco Use: Social History   Tobacco Use  Smoking Status Never  Smokeless Tobacco Never    Labs: Review Flowsheet  More data may exist      Latest Ref Rng & Units 07/01/2009 02/19/2015 03/29/2016 09/26/2017 04/03/2018  Labs for ITP Cardiac and Pulmonary Rehab  Cholestrol 0 - 200 mg/dL 162        ATP III CLASSIFICATION:  <200     mg/dL   Desirable  200-239  mg/dL   Borderline High  >=240    mg/dL   High         - - - -  LDL (calc) 0 - 99 mg/dL 85        Total  Cholesterol/HDL:CHD Risk Coronary Heart Disease Risk Table                     Men   Women  1/2 Average Risk   3.4   3.3  Average Risk       5.0   4.4  2 X Average Risk   9.6   7.1  3 X Average Risk  23.4   11.0        Use the calculated Patient Ratio above and the CHD Risk Table to determine the patient's CHD Risk.        ATP III CLASSIFICATION (LDL):  <100     mg/dL   Optimal  100-129  mg/dL   Near or Above                    Optimal  130-159  mg/dL   Borderline  160-189  mg/dL   High  >190     mg/dL   Very High  - - - -  HDL-C >39 mg/dL 56  - - - -  Trlycerides <150 mg/dL 103  - - - -  Hemoglobin A1c 4.8 - 5.6 % - 7.7  6.8  7.7  6.8   TCO2 0 - 100 mmol/L 21  - - - -    Capillary Blood Glucose: Lab Results  Component Value Date   GLUCAP 134 (H) 09/22/2018  GLUCAP 82 09/22/2018   GLUCAP 84 04/05/2018   GLUCAP 75 04/05/2018   GLUCAP 69 04/05/2018    POCT Glucose     Row Name 02/20/23 1056             POCT Blood Glucose   Pre-Exercise 72 mg/dL  Snack provided to patient       Post-Exercise 96 mg/dL       Pre-Exercise #2 89 mg/dL                Pulmonary Assessment Scores:  Pulmonary Assessment Scores     Row Name 02/20/23 1143 02/20/23 1220       ADL UCSD   SOB Score total 61 --      CAT Score   CAT Score 28 --      mMRC Score   mMRC Score -- 2            UCSD: Self-administered rating of dyspnea associated with activities of daily living (ADLs) 6-point scale (0 = "not at all" to 5 = "maximal or unable to do because of breathlessness")  Scoring Scores range from 0 to 120.  Minimally important difference is 5 units  CAT: CAT can identify the health impairment of COPD patients and is better correlated with disease progression.  CAT has a scoring range of zero to 40. The CAT score is classified into four groups of low (less than 10), medium (10 - 20), high (21-30) and very high (31-40) based on the impact level of disease on health status.  A CAT score over 10 suggests significant symptoms.  A worsening CAT score could be explained by an exacerbation, poor medication adherence, poor inhaler technique, or progression of COPD or comorbid conditions.  CAT MCID is 2 points  mMRC: mMRC (Modified Medical Research Council) Dyspnea Scale is used to assess the degree of baseline functional disability in patients of respiratory disease due to dyspnea. No minimal important difference is established. A decrease in score of 1 point or greater is considered a positive change.   Pulmonary Function Assessment:  Pulmonary Function Assessment - 02/20/23 1315       Breath   Bilateral Breath Sounds Clear   RLL diminished   Shortness of Breath Yes;Fear of Shortness of Breath;Limiting activity             Exercise Target Goals: Exercise Program Goal: Individual exercise prescription set using results from initial 6 min walk test and THRR while considering  patient's activity barriers and safety.   Exercise Prescription Goal: Initial exercise prescription builds to 30-45 minutes a day of aerobic activity, 2-3 days per week.  Home exercise guidelines will be given to patient during program as part of exercise prescription that the participant will acknowledge.  Activity Barriers & Risk Stratification:  Activity Barriers & Cardiac Risk Stratification - 02/20/23 1057       Activity Barriers & Cardiac Risk Stratification   Activity Barriers Deconditioning;Muscular Weakness;Shortness of Breath;Assistive Device;Joint Problems;Back Problems   No squats, kneeling, weight restrictions 10lbs or less            6 Minute Walk:  6 Minute Walk     Row Name 02/20/23 1212         6 Minute Walk   Phase Initial     Distance 812 feet     Walk Time 6 minutes     # of Rest Breaks 1  2:43-3:40     MPH 1.54     METS  1.92     RPE 17     Perceived Dyspnea  2     VO2 Peak 6.71     Resting HR 82 bpm     Resting BP 102/60     Resting Oxygen  Saturation  96 %     Exercise Oxygen Saturation  during 6 min walk 93 %     Max Ex. HR 132 bpm     Max Ex. BP 122/64     2 Minute Post BP 112/68       Interval HR   1 Minute HR 111     2 Minute HR 124     3 Minute HR 126     4 Minute HR 115     5 Minute HR 119     6 Minute HR 132     2 Minute Post HR 84     Interval Heart Rate? Yes       Interval Oxygen   Interval Oxygen? Yes     Baseline Oxygen Saturation % 96 %     1 Minute Oxygen Saturation % 98 %     1 Minute Liters of Oxygen 0 L     2 Minute Oxygen Saturation % 94 %     2 Minute Liters of Oxygen 0 L     3 Minute Oxygen Saturation % 96 %     3 Minute Liters of Oxygen 0 L     4 Minute Oxygen Saturation % 95 %     4 Minute Liters of Oxygen 0 L     5 Minute Oxygen Saturation % 93 %     5 Minute Liters of Oxygen 0 L     6 Minute Oxygen Saturation % 94 %     6 Minute Liters of Oxygen 0 L     2 Minute Post Oxygen Saturation % 96 %     2 Minute Post Liters of Oxygen 0 L              Oxygen Initial Assessment:  Oxygen Initial Assessment - 02/20/23 1219       Initial 6 min Walk   Oxygen Used None      Program Oxygen Prescription   Program Oxygen Prescription None      Intervention   Short Term Goals To learn and understand importance of maintaining oxygen saturations>88%;To learn and demonstrate proper pursed lip breathing techniques or other breathing techniques. ;To learn and demonstrate proper use of respiratory medications    Long  Term Goals Exhibits proper breathing techniques, such as pursed lip breathing or other method taught during program session;Compliance with respiratory medication;Demonstrates proper use of MDI's;Maintenance of O2 saturations>88%             Oxygen Re-Evaluation:   Oxygen Discharge (Final Oxygen Re-Evaluation):   Initial Exercise Prescription:  Initial Exercise Prescription - 02/20/23 1300       Date of Initial Exercise RX and Referring Provider   Date 02/20/23     Referring Provider Dr. Elsworth Soho    Expected Discharge Date 05/16/23      Recumbant Elliptical   Level 1    Minutes 15      Track   Laps 8    Minutes 15      Prescription Details   Frequency (times per week) 2    Duration Progress to 30 minutes of continuous aerobic without signs/symptoms of physical distress      Intensity   THRR  40-80% of Max Heartrate 66-132    Ratings of Perceived Exertion 11-13    Perceived Dyspnea 0-4      Progression   Progression Continue to progress workloads to maintain intensity without signs/symptoms of physical distress.      Resistance Training   Training Prescription Yes    Weight red bands    Reps 10-15             Perform Capillary Blood Glucose checks as needed.  Exercise Prescription Changes:   Exercise Comments:   Exercise Goals and Review:   Exercise Goals     Row Name 02/20/23 1111             Exercise Goals   Increase Physical Activity Yes       Intervention Provide advice, education, support and counseling about physical activity/exercise needs.;Develop an individualized exercise prescription for aerobic and resistive training based on initial evaluation findings, risk stratification, comorbidities and participant's personal goals.       Expected Outcomes Short Term: Attend rehab on a regular basis to increase amount of physical activity.;Long Term: Exercising regularly at least 3-5 days a week.;Long Term: Add in home exercise to make exercise part of routine and to increase amount of physical activity.       Increase Strength and Stamina Yes       Intervention Provide advice, education, support and counseling about physical activity/exercise needs.;Develop an individualized exercise prescription for aerobic and resistive training based on initial evaluation findings, risk stratification, comorbidities and participant's personal goals.       Expected Outcomes Short Term: Increase workloads from initial exercise prescription  for resistance, speed, and METs.;Short Term: Perform resistance training exercises routinely during rehab and add in resistance training at home;Long Term: Improve cardiorespiratory fitness, muscular endurance and strength as measured by increased METs and functional capacity (6MWT)       Able to understand and use rate of perceived exertion (RPE) scale Yes       Intervention Provide education and explanation on how to use RPE scale       Expected Outcomes Short Term: Able to use RPE daily in rehab to express subjective intensity level;Long Term:  Able to use RPE to guide intensity level when exercising independently       Able to understand and use Dyspnea scale Yes       Intervention Provide education and explanation on how to use Dyspnea scale       Expected Outcomes Short Term: Able to use Dyspnea scale daily in rehab to express subjective sense of shortness of breath during exertion;Long Term: Able to use Dyspnea scale to guide intensity level when exercising independently       Knowledge and understanding of Target Heart Rate Range (THRR) Yes       Intervention Provide education and explanation of THRR including how the numbers were predicted and where they are located for reference       Expected Outcomes Short Term: Able to state/look up THRR;Short Term: Able to use daily as guideline for intensity in rehab;Long Term: Able to use THRR to govern intensity when exercising independently       Understanding of Exercise Prescription Yes       Intervention Provide education, explanation, and written materials on patient's individual exercise prescription       Expected Outcomes Short Term: Able to explain program exercise prescription;Long Term: Able to explain home exercise prescription to exercise independently  Exercise Goals Re-Evaluation :   Discharge Exercise Prescription (Final Exercise Prescription Changes):   Nutrition:  Target Goals: Understanding of nutrition  guidelines, daily intake of sodium '1500mg'$ , cholesterol '200mg'$ , calories 30% from fat and 7% or less from saturated fats, daily to have 5 or more servings of fruits and vegetables.  Biometrics:  Pre Biometrics - 02/20/23 1217       Pre Biometrics   Grip Strength 22 kg              Nutrition Therapy Plan and Nutrition Goals:   Nutrition Assessments:  MEDIFICTS Score Key: ?70 Need to make dietary changes  40-70 Heart Healthy Diet ? 40 Therapeutic Level Cholesterol Diet   Picture Your Plate Scores: [id number] Unhealthy dietary pattern with much room for improvement. 41-50 Dietary pattern unlikely to meet recommendations for good health and room for improvement. 51-60 More healthful dietary pattern, with some room for improvement.  >60 Healthy dietary pattern, although there may be some specific behaviors that could be improved.    Nutrition Goals Re-Evaluation:   Nutrition Goals Discharge (Final Nutrition Goals Re-Evaluation):   Psychosocial: Target Goals: Acknowledge presence or absence of significant depression and/or stress, maximize coping skills, provide positive support system. Participant is able to verbalize types and ability to use techniques and skills needed for reducing stress and depression.  Initial Review & Psychosocial Screening:  Initial Psych Review & Screening - 02/20/23 1111       Initial Review   Current issues with History of Depression;Current Psychotropic Meds;Current Stress Concerns    Source of Stress Concerns Family;Chronic Illness    Comments Pt states more stress due to mother in law moving, her health, and her shortness of breath      Family Dynamics   Good Support System? Yes      Barriers   Psychosocial barriers to participate in program The patient should benefit from training in stress management and relaxation.      Screening Interventions   Interventions Encouraged to exercise;To provide support and resources with identified  psychosocial needs;Provide feedback about the scores to participant    Expected Outcomes Long Term Goal: Stressors or current issues are controlled or eliminated.;Short Term goal: Identification and review with participant of any Quality of Life or Depression concerns found by scoring the questionnaire.;Long Term goal: The participant improves quality of Life and PHQ9 Scores as seen by post scores and/or verbalization of changes             Quality of Life Scores:  Scores of 19 and below usually indicate a poorer quality of life in these areas.  A difference of  2-3 points is a clinically meaningful difference.  A difference of 2-3 points in the total score of the Quality of Life Index has been associated with significant improvement in overall quality of life, self-image, physical symptoms, and general health in studies assessing change in quality of life.  PHQ-9: Review Flowsheet       02/20/2023 06/17/2017 02/23/2016  Depression screen PHQ 2/9  Decreased Interest 1 0 2  Down, Depressed, Hopeless 1 0 0  PHQ - 2 Score 2 0 2  Altered sleeping 1 - 3  Tired, decreased energy 3 - 3  Change in appetite 1 - 1  Feeling bad or failure about yourself  0 - 2  Trouble concentrating 1 - 0  Moving slowly or fidgety/restless 0 - 3  Suicidal thoughts 0 - 0  PHQ-9 Score 8 - 14  Difficult doing  work/chores Somewhat difficult - Extremely dIfficult   Interpretation of Total Score  Total Score Depression Severity:  1-4 = Minimal depression, 5-9 = Mild depression, 10-14 = Moderate depression, 15-19 = Moderately severe depression, 20-27 = Severe depression   Psychosocial Evaluation and Intervention:  Psychosocial Evaluation - 02/20/23 1116       Psychosocial Evaluation & Interventions   Interventions Relaxation education    Continue Psychosocial Services  Follow up required by staff             Psychosocial Re-Evaluation:   Psychosocial Discharge (Final Psychosocial  Re-Evaluation):   Education: Education Goals: Education classes will be provided on a weekly basis, covering required topics. Participant will state understanding/return demonstration of topics presented.  Learning Barriers/Preferences:  Learning Barriers/Preferences - 02/20/23 1117       Learning Barriers/Preferences   Learning Barriers Sight;Exercise Concerns    Learning Preferences Individual Instruction;Group Instruction;Verbal Instruction             Education Topics: Introduction to Pulmonary Rehab Group instruction provided by PowerPoint, verbal discussion, and written material to support subject matter. Instructor reviews what Pulmonary Rehab is, the purpose of the program, and how patients are referred.     Know Your Numbers Group instruction that is supported by a PowerPoint presentation. Instructor discusses importance of knowing and understanding resting, exercise, and post-exercise oxygen saturation, heart rate, and blood pressure. Oxygen saturation, heart rate, blood pressure, rating of perceived exertion, and dyspnea are reviewed along with a normal range for these values.    Exercise for the Pulmonary Patient Group instruction that is supported by a PowerPoint presentation. Instructor discusses benefits of exercise, core components of exercise, frequency, duration, and intensity of an exercise routine, importance of utilizing pulse oximetry during exercise, safety while exercising, and options of places to exercise outside of rehab.    MET Level  Group instruction provided by PowerPoint, verbal discussion, and written material to support subject matter. Instructor reviews what METs are and how to increase METs.    Pulmonary Medications Verbally interactive group education provided by instructor with focus on inhaled medications and proper administration.   Anatomy and Physiology of the Respiratory System Group instruction provided by PowerPoint, verbal  discussion, and written material to support subject matter. Instructor reviews respiratory cycle and anatomical components of the respiratory system and their functions. Instructor also reviews differences in obstructive and restrictive respiratory diseases with examples of each.    Oxygen Safety Group instruction provided by PowerPoint, verbal discussion, and written material to support subject matter. There is an overview of "What is Oxygen" and "Why do we need it".  Instructor also reviews how to create a safe environment for oxygen use, the importance of using oxygen as prescribed, and the risks of noncompliance. There is a brief discussion on traveling with oxygen and resources the patient may utilize.   Oxygen Use Group instruction provided by PowerPoint, verbal discussion, and written material to discuss how supplemental oxygen is prescribed and different types of oxygen supply systems. Resources for more information are provided.    Breathing Techniques Group instruction that is supported by demonstration and informational handouts. Instructor discusses the benefits of pursed lip and diaphragmatic breathing and detailed demonstration on how to perform both.     Risk Factor Reduction Group instruction that is supported by a PowerPoint presentation. Instructor discusses the definition of a risk factor, different risk factors for pulmonary disease, and how the heart and lungs work together.   MD Day A group  question and answer session with a medical doctor that allows participants to ask questions that relate to their pulmonary disease state.   Nutrition for the Pulmonary Patient Group instruction provided by PowerPoint slides, verbal discussion, and written materials to support subject matter. The instructor gives an explanation and review of healthy diet recommendations, which includes a discussion on weight management, recommendations for fruit and vegetable consumption, as well as  protein, fluid, caffeine, fiber, sodium, sugar, and alcohol. Tips for eating when patients are short of breath are discussed.    Other Education Group or individual verbal, written, or video instructions that support the educational goals of the pulmonary rehab program.    Knowledge Questionnaire Score:  Knowledge Questionnaire Score - 02/20/23 1142       Knowledge Questionnaire Score   Pre Score 17/18             Core Components/Risk Factors/Patient Goals at Admission:  Personal Goals and Risk Factors at Admission - 02/20/23 1117       Core Components/Risk Factors/Patient Goals on Admission    Weight Management Obesity;Weight Loss    Improve shortness of breath with ADL's Yes    Intervention Provide education, individualized exercise plan and daily activity instruction to help decrease symptoms of SOB with activities of daily living.    Expected Outcomes Short Term: Improve cardiorespiratory fitness to achieve a reduction of symptoms when performing ADLs;Long Term: Be able to perform more ADLs without symptoms or delay the onset of symptoms    Increase knowledge of respiratory medications and ability to use respiratory devices properly  Yes    Intervention Provide education and demonstration as needed of appropriate use of medications, inhalers, and oxygen therapy.    Expected Outcomes Short Term: Achieves understanding of medications use. Understands that oxygen is a medication prescribed by physician. Demonstrates appropriate use of inhaler and oxygen therapy.;Long Term: Maintain appropriate use of medications, inhalers, and oxygen therapy.    Diabetes Yes    Intervention Provide education about signs/symptoms and action to take for hypo/hyperglycemia.;Provide education about proper nutrition, including hydration, and aerobic/resistive exercise prescription along with prescribed medications to achieve blood glucose in normal ranges: Fasting glucose 65-99 mg/dL    Expected  Outcomes Short Term: Participant verbalizes understanding of the signs/symptoms and immediate care of hyper/hypoglycemia, proper foot care and importance of medication, aerobic/resistive exercise and nutrition plan for blood glucose control.;Long Term: Attainment of HbA1C < 7%.    Stress Yes    Intervention Offer individual and/or small group education and counseling on adjustment to heart disease, stress management and health-related lifestyle change. Teach and support self-help strategies.;Refer participants experiencing significant psychosocial distress to appropriate mental health specialists for further evaluation and treatment. When possible, include family members and significant others in education/counseling sessions.    Expected Outcomes Short Term: Participant demonstrates changes in health-related behavior, relaxation and other stress management skills, ability to obtain effective social support, and compliance with psychotropic medications if prescribed.;Long Term: Emotional wellbeing is indicated by absence of clinically significant psychosocial distress or social isolation.             Core Components/Risk Factors/Patient Goals Review:    Core Components/Risk Factors/Patient Goals at Discharge (Final Review):    ITP Comments:   Comments: Dr. Rodman Pickle is Medical Director for Pulmonary Rehab at [facility].

## 2023-02-20 NOTE — Progress Notes (Addendum)
[name] 57 y.o. female "[name]" Pulmonary Rehab Orientation Note  This patient who was referred to Pulmonary Rehab by Dr. Elsworth Soho with the diagnosis of chronic respiratory failure with hypoxia and asthma in adult, mild, persistent, uncomplicated arrived today in Cardiac and Pulmonary Rehab. She  arrived ambulatory with assistive device with normal gait. She  does not carry portable oxygen.   Color good, skin warm and dry. Patient is oriented to time and place. Patient's medical history, psychosocial health, and medications reviewed. Psychosocial assessment reveals patient lives with spouse and mother-in-law. [name] is currently unemployed, disabled. Patient hobbies include watching tv, spending time with others, and shopping. Patient reports her stress level is high. Areas of stress/anxiety include health and family . Patient does exhibit signs of depression. Signs of depression include agitation, crying spells, helplessness, and sadness and fatigue. PHQ2/9 score 2/8. Krisa shows fair  coping skills with positive outlook on life. Offered emotional support and reassurance. Will continue to monitor and evaluate progress toward psychosocial goal(s) of decreased stress, depression, and anxiety.   Physical assessment reveals patient is alert and oriented x 4.  Heart rate is normal, breath sounds clear to auscultation, RLL diminished, no wheezes, rales, or rhonchi. Pt denies chronic cough. Bowel sounds present.  Pt endorses chronic nausea, denies abdominal discomfort, vomiting or diarrhea. Grip strength equal, strong. Distal pulses +2; no swelling to lower extremities. Pt states she has a diabetic ulcer to her left foot, great toe.    [name] reports she does take medications as prescribed. Patient states she follows a regular  diet. The patient has been trying to lose weight through a healthy diet and exercise program. Pt states she is currently taking ozempic and has lost weight from her starting weight of  309. Pt's weight will be monitored closely.   Demonstration and practice of PLB using pulse oximeter. [name] is able to return demonstration satisfactorily. Safety and hand hygiene in the exercise area reviewed with patient. [name] voices understanding of the information reviewed. Department expectations discussed with patient and achievable goals were set. The patient shows enthusiasm about attending the program and we look forward to working with [name]. [name] completed a 6 min walk test today and is scheduled to begin exercise on 02/26/2023 at 1015.   [id number] Janine Ores, RN, BSN

## 2023-02-20 NOTE — Progress Notes (Signed)
Pulmonary Individual Treatment Plan  Patient Details  Name: [name] MRN: [id number] Date of Birth: 1966-05-23 Referring Provider:   April Manson Pulmonary Rehab Walk Test from 02/20/2023 in [facility] [place]  Referring Provider Dr. Elsworth Soho       Initial Encounter Date:  Flowsheet Row Pulmonary Rehab Walk Test from 02/20/2023 in [facility]  Date 02/20/23       Visit Diagnosis: Chronic respiratory failure with hypoxia ([place])  Mild persistent asthma without complication  Patient's Home Medications on Admission:   Current Outpatient Medications:    acetaminophen (TYLENOL) 500 MG tablet, Take 1,000 mg by mouth every 6 (six) hours as needed for moderate pain or headache. , Disp: , Rfl:    albuterol (VENTOLIN HFA) 108 (90 Base) MCG/ACT inhaler, inhale 2 puffs DAILY, Disp: 6.7 g, Rfl: 11   ALPRAZolam (XANAX) 0.5 MG tablet, Take 1 tablet by mouth every 6 (six) hours as needed., Disp: , Rfl:    ammonium lactate (LAC-HYDRIN) 12 % lotion, APPLY TO ARMS   HANDS 2 TIMES A DAY ESPECIALLY AFTER BATH, Disp: , Rfl:    APPLE CIDER VINEGAR PO, Take 1 each by mouth daily. One gummy once a day, Disp: , Rfl:    ARTIFICIAL TEARS 0.2-0.2-1 % SOLN, See admin instructions., Disp: , Rfl:    ASPIRIN 81 PO, 81 tablets every day by oral route., Disp: , Rfl:    atorvastatin (LIPITOR) 20 MG tablet, Take 1 tablet by mouth daily., Disp: , Rfl:    Azelastine HCl 137 MCG/SPRAY SOLN, daily., Disp: , Rfl:    b complex vitamins tablet, Take 1 tablet by mouth daily., Disp: , Rfl:    Bayer Microlet Lancets lancets, 4 each by Other route 4 (four) times daily., Disp: , Rfl:    Bepotastine Besilate 1.5 % SOLN, 1 drop 2 (two) times daily., Disp: , Rfl:    bisoprolol (ZEBETA) 5 MG tablet, Take 1 tablet (5 mg total) by mouth daily., Disp: 90 tablet, Rfl: 3   Black Pepper-Turmeric (TURMERIC CURCUMIN) 04-999 MG  CAPS, See admin instructions. At bedtime, Disp: , Rfl:    cetirizine (ZYRTEC) 10 MG tablet, 1 tablet as needed, Disp: , Rfl:    clobetasol cream (TEMOVATE) [id number] %, Apply 1 application topically 2 (two) times daily as needed. , Disp: , Rfl:    cloNIDine (CATAPRES - DOSED IN MG/24 HR) 0.1 mg/24hr patch, Place 1 patch (0.1 mg total) onto the skin once a week. Start in 2 weeks, Disp: 4 patch, Rfl: 12   Continuous Blood Gluc Sensor (FREESTYLE LIBRE 3 SENSOR) MISC, USE AS DIRECTED TO CHECK BLOOD SUGARS, Disp: , Rfl:    cyclobenzaprine (FLEXERIL) 10 MG tablet, Take 10 mg by mouth See admin instructions. Take 10 mg by mouth at bedtime. Take 10 mg by mouth every 8 hours as needed for muscle spasms, Disp: , Rfl:    D3-50 50000 units capsule, Take 50,000 Units by mouth every Saturday., Disp: , Rfl: 4   dapagliflozin propanediol (FARXIGA) 5 MG TABS tablet, 1 tablet, Disp: , Rfl:    diclofenac Sodium (VOLTAREN) 1 % GEL, APPLY 2 GRAMS TO AFFECTED AREA OF FOOT 2-4 TIMES DAILY, Disp: 100 g, Rfl: 0   dicyclomine (BENTYL) 10 MG capsule, Take 1 capsule (10 mg total) by mouth every 8 (eight) hours as needed for spasms., Disp: 120 capsule, Rfl: 11   diphenhydrAMINE (BENADRYL) 50 MG/ML injection, ,  Disp: , Rfl:    DULoxetine (CYMBALTA) 60 MG capsule, Take 60 mg by mouth daily., Disp: , Rfl:    fluticasone (FLONASE) 50 MCG/ACT nasal spray, Place 1 spray into both nostrils at bedtime., Disp: , Rfl:    fluticasone furoate-vilanterol (BREO ELLIPTA) 200-25 MCG/ACT AEPB, Inhale 1 puff into the lungs daily., Disp: 60 each, Rfl: 2   furosemide (LASIX) 80 MG tablet, Take 80 mg by mouth daily., Disp: , Rfl:    gabapentin (NEURONTIN) 300 MG capsule, Take 2 capsules (600 mg total) by mouth 3 (three) times daily., Disp: 540 capsule, Rfl: 1   GAMUNEX-C 20 GM/200ML SOLN, , Disp: , Rfl:    glucose blood (CONTOUR NEXT TEST) test strip, 4 each by Other route 4 (four) times daily., Disp: , Rfl:    HYDROcodone-acetaminophen (NORCO) 7.5-325  MG tablet, Take 1-2 tablets by mouth every 6 (six) hours as needed for moderate pain (for pain.)., Disp: 40 tablet, Rfl: 0   hyoscyamine (LEVSIN SL) 0.125 MG SL tablet, PLACE 2 TABLETS UNDER TONGUE EVERY 4 HOURS AS NEEDED WITH MEALS., Disp: 120 tablet, Rfl: 11   Insulin Human (INSULIN PUMP) SOLN, Inject into the skin., Disp: , Rfl:    insulin regular human CONCENTRATED (HUMULIN R) 500 UNIT/ML injection, Inject 2 Units into the skin See admin instructions. 2.0 units /hour 0500-2359, 1.95units [id number] with carb ratio 1:50 for meal time bolus, Disp: , Rfl:    ketoconazole (NIZORAL) 2 % cream, Apply 1 application topically 2 (two) times daily as needed (for toes). , Disp: , Rfl:    levothyroxine (SYNTHROID) 137 MCG tablet, Take 2 tablets by mouth daily., Disp: , Rfl:    lisinopril (ZESTRIL) 10 MG tablet, Take 1 tablet (10 mg total) by mouth daily., Disp: 90 tablet, Rfl: 3   metFORMIN (GLUCOPHAGE) 1000 MG tablet, 1 tablet with a meal, Disp: , Rfl:    metroNIDAZOLE (METROGEL) 1 % gel, Apply topically 2 (two) times daily., Disp: , Rfl:    Multiple Vitamin (MULTIVITAMIN) tablet, Take 1 tablet by mouth at bedtime. , Disp: , Rfl:    mupirocin ointment (BACTROBAN) 2 %, APPLY  OINTMENT TOPICALLY TO AFFECTED AREA TWICE DAILY, Disp: 22 g, Rfl: 0   NURTEC 75 MG TBDP, Take 1 tablet by mouth daily., Disp: , Rfl:    ondansetron (ZOFRAN-ODT) 8 MG disintegrating tablet, Take 1 tablet (8 mg total) by mouth every 8 (eight) hours as needed. Please call [phone] to schedule an office visit for more refills, Disp: 30 tablet, Rfl: 4   OZEMPIC, 1 MG/DOSE, 4 MG/3ML SOPN, Inject into the skin., Disp: , Rfl:    pantoprazole (PROTONIX) 40 MG tablet, Take 1 tablet (40 mg total) by mouth 2 (two) times daily., Disp: 180 tablet, Rfl: 4   PATADAY 0.1 % ophthalmic solution, 1 drop into affected eye Ophthalmic Twice a day for 30 days, Disp: , Rfl:    polyethylene glycol (MIRALAX / GLYCOLAX) 17 g packet, Take 17 g by mouth daily.,  Disp: 90 packet, Rfl: 3   PROCTOSOL HC 2.5 % rectal cream, Apply 1 application topically 2 (two) times daily as needed for hemorrhoids. , Disp: , Rfl: 2   sodium chloride 0.9 % infusion, Inject into the vein., Disp: , Rfl:    Spacer/Aero-Holding Chambers (AEROCHAMBER MV) inhaler, Use as instructed, Disp: 1 each, Rfl: 0   Urea (UREA NAIL) 45 % GEL, Apply 1 Application topically daily., Disp: 28 mL, Rfl: 1   VRAYLAR 1.5 MG capsule, Take 1.5 mg by  mouth daily., Disp: , Rfl:   Past Medical History: Past Medical History:  Diagnosis Date   Anginal pain ([place])    chest pain  heart cath done   Anxiety    Cancer [facility])    thyroid cancer   Chronic bronchitis ([place])    "get it q yr"   Chronic inflammatory demyelinating polyneuropathy ([facility])    Chronic kidney disease    Proteinuria   Dyspnea    sob with exertion   Early cataract    right   Fibromyalgia    GERD (gastroesophageal reflux disease)    Heart murmur    Hepatic adenoma    High cholesterol    History of gout    History of hiatal hernia    History of kidney stones    Hypertension    Hypothyroidism    IBS (irritable bowel syndrome)    Iron deficiency anemia    "comes and goes"   Migraine    "@ least once/month" (02/18/2015)   Obesity    PONV (postoperative nausea and vomiting)    patch and anti nausea meds work well   Proteinuria    Spinal headache    Syncope    Tachycardia    Type II diabetes mellitus ([place])    pt has an insulin pump   Wears glasses     Tobacco Use: Social History   Tobacco Use  Smoking Status Never  Smokeless Tobacco Never    Labs: Review Flowsheet  More data may exist      Latest Ref Rng & Units 07/01/2009 02/19/2015 03/29/2016 09/26/2017 04/03/2018  Labs for ITP Cardiac and Pulmonary Rehab  Cholestrol 0 - 200 mg/dL 162        ATP III CLASSIFICATION:  <200     mg/dL   Desirable  200-239  mg/dL   Borderline High  >=240    mg/dL   High         - - - -  LDL (calc) 0 - 99 mg/dL 85        Total  Cholesterol/HDL:CHD Risk Coronary Heart Disease Risk Table                     Men   Women  1/2 Average Risk   3.4   3.3  Average Risk       5.0   4.4  2 X Average Risk   9.6   7.1  3 X Average Risk  23.4   11.0        Use the calculated Patient Ratio above and the CHD Risk Table to determine the patient's CHD Risk.        ATP III CLASSIFICATION (LDL):  <100     mg/dL   Optimal  100-129  mg/dL   Near or Above                    Optimal  130-159  mg/dL   Borderline  160-189  mg/dL   High  >190     mg/dL   Very High  - - - -  HDL-C >39 mg/dL 56  - - - -  Trlycerides <150 mg/dL 103  - - - -  Hemoglobin A1c 4.8 - 5.6 % - 7.7  6.8  7.7  6.8   TCO2 0 - 100 mmol/L 21  - - - -    Capillary Blood Glucose: Lab Results  Component Value Date   GLUCAP 134 (H) 09/22/2018  GLUCAP 82 09/22/2018   GLUCAP 84 04/05/2018   GLUCAP 75 04/05/2018   GLUCAP 69 04/05/2018    POCT Glucose     Row Name 02/20/23 1056             POCT Blood Glucose   Pre-Exercise 72 mg/dL  Snack provided to patient       Post-Exercise 96 mg/dL       Pre-Exercise #2 89 mg/dL                Pulmonary Assessment Scores:  Pulmonary Assessment Scores     Row Name 02/20/23 1143 02/20/23 1220       ADL UCSD   SOB Score total 61 --      CAT Score   CAT Score 28 --      mMRC Score   mMRC Score -- 2            UCSD: Self-administered rating of dyspnea associated with activities of daily living (ADLs) 6-point scale (0 = "not at all" to 5 = "maximal or unable to do because of breathlessness")  Scoring Scores range from 0 to 120.  Minimally important difference is 5 units  CAT: CAT can identify the health impairment of COPD patients and is better correlated with disease progression.  CAT has a scoring range of zero to 40. The CAT score is classified into four groups of low (less than 10), medium (10 - 20), high (21-30) and very high (31-40) based on the impact level of disease on health status.  A CAT score over 10 suggests significant symptoms.  A worsening CAT score could be explained by an exacerbation, poor medication adherence, poor inhaler technique, or progression of COPD or comorbid conditions.  CAT MCID is 2 points  mMRC: mMRC (Modified Medical Research Council) Dyspnea Scale is used to assess the degree of baseline functional disability in patients of respiratory disease due to dyspnea. No minimal important difference is established. A decrease in score of 1 point or greater is considered a positive change.   Pulmonary Function Assessment:  Pulmonary Function Assessment - 02/20/23 1315       Breath   Bilateral Breath Sounds Clear   RLL diminished   Shortness of Breath Yes;Fear of Shortness of Breath;Limiting activity             Exercise Target Goals: Exercise Program Goal: Individual exercise prescription set using results from initial 6 min walk test and THRR while considering  patient's activity barriers and safety.   Exercise Prescription Goal: Initial exercise prescription builds to 30-45 minutes a day of aerobic activity, 2-3 days per week.  Home exercise guidelines will be given to patient during program as part of exercise prescription that the participant will acknowledge.  Activity Barriers & Risk Stratification:  Activity Barriers & Cardiac Risk Stratification - 02/20/23 1057       Activity Barriers & Cardiac Risk Stratification   Activity Barriers Deconditioning;Muscular Weakness;Shortness of Breath;Assistive Device;Joint Problems;Back Problems   No squats, kneeling, weight restrictions 10lbs or less            6 Minute Walk:  6 Minute Walk     Row Name 02/20/23 1212         6 Minute Walk   Phase Initial     Distance 812 feet     Walk Time 6 minutes     # of Rest Breaks 1  2:43-3:40     MPH 1.54     METS  1.92     RPE 17     Perceived Dyspnea  2     VO2 Peak 6.71     Resting HR 82 bpm     Resting BP 102/60     Resting Oxygen  Saturation  96 %     Exercise Oxygen Saturation  during 6 min walk 93 %     Max Ex. HR 132 bpm     Max Ex. BP 122/64     2 Minute Post BP 112/68       Interval HR   1 Minute HR 111     2 Minute HR 124     3 Minute HR 126     4 Minute HR 115     5 Minute HR 119     6 Minute HR 132     2 Minute Post HR 84     Interval Heart Rate? Yes       Interval Oxygen   Interval Oxygen? Yes     Baseline Oxygen Saturation % 96 %     1 Minute Oxygen Saturation % 98 %     1 Minute Liters of Oxygen 0 L     2 Minute Oxygen Saturation % 94 %     2 Minute Liters of Oxygen 0 L     3 Minute Oxygen Saturation % 96 %     3 Minute Liters of Oxygen 0 L     4 Minute Oxygen Saturation % 95 %     4 Minute Liters of Oxygen 0 L     5 Minute Oxygen Saturation % 93 %     5 Minute Liters of Oxygen 0 L     6 Minute Oxygen Saturation % 94 %     6 Minute Liters of Oxygen 0 L     2 Minute Post Oxygen Saturation % 96 %     2 Minute Post Liters of Oxygen 0 L              Oxygen Initial Assessment:  Oxygen Initial Assessment - 02/20/23 1219       Initial 6 min Walk   Oxygen Used None      Program Oxygen Prescription   Program Oxygen Prescription None      Intervention   Short Term Goals To learn and understand importance of maintaining oxygen saturations>88%;To learn and demonstrate proper pursed lip breathing techniques or other breathing techniques. ;To learn and demonstrate proper use of respiratory medications    Long  Term Goals Exhibits proper breathing techniques, such as pursed lip breathing or other method taught during program session;Compliance with respiratory medication;Demonstrates proper use of MDI's;Maintenance of O2 saturations>88%             Oxygen Re-Evaluation:   Oxygen Discharge (Final Oxygen Re-Evaluation):   Initial Exercise Prescription:  Initial Exercise Prescription - 02/20/23 1300       Date of Initial Exercise RX and Referring Provider   Date 02/20/23     Referring Provider Dr. Elsworth Soho    Expected Discharge Date 05/16/23      Recumbant Elliptical   Level 1    Minutes 15      Track   Laps 8    Minutes 15      Prescription Details   Frequency (times per week) 2    Duration Progress to 30 minutes of continuous aerobic without signs/symptoms of physical distress      Intensity   THRR  40-80% of Max Heartrate 66-132    Ratings of Perceived Exertion 11-13    Perceived Dyspnea 0-4      Progression   Progression Continue to progress workloads to maintain intensity without signs/symptoms of physical distress.      Resistance Training   Training Prescription Yes    Weight red bands    Reps 10-15             Perform Capillary Blood Glucose checks as needed.  Exercise Prescription Changes:   Exercise Comments:   Exercise Goals and Review:   Exercise Goals     Row Name 02/20/23 1111             Exercise Goals   Increase Physical Activity Yes       Intervention Provide advice, education, support and counseling about physical activity/exercise needs.;Develop an individualized exercise prescription for aerobic and resistive training based on initial evaluation findings, risk stratification, comorbidities and participant's personal goals.       Expected Outcomes Short Term: Attend rehab on a regular basis to increase amount of physical activity.;Long Term: Exercising regularly at least 3-5 days a week.;Long Term: Add in home exercise to make exercise part of routine and to increase amount of physical activity.       Increase Strength and Stamina Yes       Intervention Provide advice, education, support and counseling about physical activity/exercise needs.;Develop an individualized exercise prescription for aerobic and resistive training based on initial evaluation findings, risk stratification, comorbidities and participant's personal goals.       Expected Outcomes Short Term: Increase workloads from initial exercise prescription  for resistance, speed, and METs.;Short Term: Perform resistance training exercises routinely during rehab and add in resistance training at home;Long Term: Improve cardiorespiratory fitness, muscular endurance and strength as measured by increased METs and functional capacity (6MWT)       Able to understand and use rate of perceived exertion (RPE) scale Yes       Intervention Provide education and explanation on how to use RPE scale       Expected Outcomes Short Term: Able to use RPE daily in rehab to express subjective intensity level;Long Term:  Able to use RPE to guide intensity level when exercising independently       Able to understand and use Dyspnea scale Yes       Intervention Provide education and explanation on how to use Dyspnea scale       Expected Outcomes Short Term: Able to use Dyspnea scale daily in rehab to express subjective sense of shortness of breath during exertion;Long Term: Able to use Dyspnea scale to guide intensity level when exercising independently       Knowledge and understanding of Target Heart Rate Range (THRR) Yes       Intervention Provide education and explanation of THRR including how the numbers were predicted and where they are located for reference       Expected Outcomes Short Term: Able to state/look up THRR;Short Term: Able to use daily as guideline for intensity in rehab;Long Term: Able to use THRR to govern intensity when exercising independently       Understanding of Exercise Prescription Yes       Intervention Provide education, explanation, and written materials on patient's individual exercise prescription       Expected Outcomes Short Term: Able to explain program exercise prescription;Long Term: Able to explain home exercise prescription to exercise independently  Exercise Goals Re-Evaluation :   Discharge Exercise Prescription (Final Exercise Prescription Changes):   Nutrition:  Target Goals: Understanding of nutrition  guidelines, daily intake of sodium '1500mg'$ , cholesterol '200mg'$ , calories 30% from fat and 7% or less from saturated fats, daily to have 5 or more servings of fruits and vegetables.  Biometrics:  Pre Biometrics - 02/20/23 1217       Pre Biometrics   Grip Strength 22 kg              Nutrition Therapy Plan and Nutrition Goals:   Nutrition Assessments:  MEDIFICTS Score Key: ?70 Need to make dietary changes  40-70 Heart Healthy Diet ? 40 Therapeutic Level Cholesterol Diet   Picture Your Plate Scores: [id number] Unhealthy dietary pattern with much room for improvement. 41-50 Dietary pattern unlikely to meet recommendations for good health and room for improvement. 51-60 More healthful dietary pattern, with some room for improvement.  >60 Healthy dietary pattern, although there may be some specific behaviors that could be improved.    Nutrition Goals Re-Evaluation:   Nutrition Goals Discharge (Final Nutrition Goals Re-Evaluation):   Psychosocial: Target Goals: Acknowledge presence or absence of significant depression and/or stress, maximize coping skills, provide positive support system. Participant is able to verbalize types and ability to use techniques and skills needed for reducing stress and depression.  Initial Review & Psychosocial Screening:  Initial Psych Review & Screening - 02/20/23 1111       Initial Review   Current issues with History of Depression;Current Psychotropic Meds;Current Stress Concerns    Source of Stress Concerns Family;Chronic Illness    Comments Pt states more stress due to mother in law moving, her health, and her shortness of breath      Family Dynamics   Good Support System? Yes      Barriers   Psychosocial barriers to participate in program The patient should benefit from training in stress management and relaxation.      Screening Interventions   Interventions Encouraged to exercise;To provide support and resources with identified  psychosocial needs;Provide feedback about the scores to participant    Expected Outcomes Long Term Goal: Stressors or current issues are controlled or eliminated.;Short Term goal: Identification and review with participant of any Quality of Life or Depression concerns found by scoring the questionnaire.;Long Term goal: The participant improves quality of Life and PHQ9 Scores as seen by post scores and/or verbalization of changes             Quality of Life Scores:  Scores of 19 and below usually indicate a poorer quality of life in these areas.  A difference of  2-3 points is a clinically meaningful difference.  A difference of 2-3 points in the total score of the Quality of Life Index has been associated with significant improvement in overall quality of life, self-image, physical symptoms, and general health in studies assessing change in quality of life.  PHQ-9: Review Flowsheet       02/20/2023 06/17/2017 02/23/2016  Depression screen PHQ 2/9  Decreased Interest 1 0 2  Down, Depressed, Hopeless 1 0 0  PHQ - 2 Score 2 0 2  Altered sleeping 1 - 3  Tired, decreased energy 3 - 3  Change in appetite 1 - 1  Feeling bad or failure about yourself  0 - 2  Trouble concentrating 1 - 0  Moving slowly or fidgety/restless 0 - 3  Suicidal thoughts 0 - 0  PHQ-9 Score 8 - 14  Difficult doing  work/chores Somewhat difficult - Extremely dIfficult   Interpretation of Total Score  Total Score Depression Severity:  1-4 = Minimal depression, 5-9 = Mild depression, 10-14 = Moderate depression, 15-19 = Moderately severe depression, 20-27 = Severe depression   Psychosocial Evaluation and Intervention:  Psychosocial Evaluation - 02/20/23 1116       Psychosocial Evaluation & Interventions   Interventions Relaxation education    Comments [name] states that she is stressed about her mother-in-law moving in with her and has stress about her health. [name] is currently on psychotropic meds that keep her  mental health stable    Expected Outcomes For [name] to participate in [place] without any psychosocial barriers or concerns    Continue Psychosocial Services  Follow up required by staff   We will continue to monitor [name] for any needs that may arise            Psychosocial Re-Evaluation:   Psychosocial Discharge (Final Psychosocial Re-Evaluation):   Education: Education Goals: Education classes will be provided on a weekly basis, covering required topics. Participant will state understanding/return demonstration of topics presented.  Learning Barriers/Preferences:  Learning Barriers/Preferences - 02/20/23 1117       Learning Barriers/Preferences   Learning Barriers Sight;Exercise Concerns    Learning Preferences Individual Instruction;Group Instruction;Verbal Instruction             Education Topics: Introduction to Pulmonary Rehab Group instruction provided by PowerPoint, verbal discussion, and written material to support subject matter. Instructor reviews what Pulmonary Rehab is, the purpose of the program, and how patients are referred.     Know Your Numbers Group instruction that is supported by a PowerPoint presentation. Instructor discusses importance of knowing and understanding resting, exercise, and post-exercise oxygen saturation, heart rate, and blood pressure. Oxygen saturation, heart rate, blood pressure, rating of perceived exertion, and dyspnea are reviewed along with a normal range for these values.    Exercise for the Pulmonary Patient Group instruction that is supported by a PowerPoint presentation. Instructor discusses benefits of exercise, core components of exercise, frequency, duration, and intensity of an exercise routine, importance of utilizing pulse oximetry during exercise, safety while exercising, and options of places to exercise outside of rehab.    MET Level  Group instruction provided by PowerPoint, verbal discussion, and written  material to support subject matter. Instructor reviews what METs are and how to increase METs.    Pulmonary Medications Verbally interactive group education provided by instructor with focus on inhaled medications and proper administration.   Anatomy and Physiology of the Respiratory System Group instruction provided by PowerPoint, verbal discussion, and written material to support subject matter. Instructor reviews respiratory cycle and anatomical components of the respiratory system and their functions. Instructor also reviews differences in obstructive and restrictive respiratory diseases with examples of each.    Oxygen Safety Group instruction provided by PowerPoint, verbal discussion, and written material to support subject matter. There is an overview of "What is Oxygen" and "Why do we need it".  Instructor also reviews how to create a safe environment for oxygen use, the importance of using oxygen as prescribed, and the risks of noncompliance. There is a brief discussion on traveling with oxygen and resources the patient may utilize.   Oxygen Use Group instruction provided by PowerPoint, verbal discussion, and written material to discuss how supplemental oxygen is prescribed and different types of oxygen supply systems. Resources for more information are provided.    Breathing Techniques Group instruction that is supported by demonstration  and informational handouts. Instructor discusses the benefits of pursed lip and diaphragmatic breathing and detailed demonstration on how to perform both.     Risk Factor Reduction Group instruction that is supported by a PowerPoint presentation. Instructor discusses the definition of a risk factor, different risk factors for pulmonary disease, and how the heart and lungs work together.   MD Day A group question and answer session with a medical doctor that allows participants to ask questions that relate to their pulmonary disease  state.   Nutrition for the Pulmonary Patient Group instruction provided by PowerPoint slides, verbal discussion, and written materials to support subject matter. The instructor gives an explanation and review of healthy diet recommendations, which includes a discussion on weight management, recommendations for fruit and vegetable consumption, as well as protein, fluid, caffeine, fiber, sodium, sugar, and alcohol. Tips for eating when patients are short of breath are discussed.    Other Education Group or individual verbal, written, or video instructions that support the educational goals of the pulmonary rehab program.    Knowledge Questionnaire Score:  Knowledge Questionnaire Score - 02/20/23 1142       Knowledge Questionnaire Score   Pre Score 17/18             Core Components/Risk Factors/Patient Goals at Admission:  Personal Goals and Risk Factors at Admission - 02/20/23 1117       Core Components/Risk Factors/Patient Goals on Admission    Weight Management Obesity;Weight Loss    Improve shortness of breath with ADL's Yes    Intervention Provide education, individualized exercise plan and daily activity instruction to help decrease symptoms of SOB with activities of daily living.    Expected Outcomes Short Term: Improve cardiorespiratory fitness to achieve a reduction of symptoms when performing ADLs;Long Term: Be able to perform more ADLs without symptoms or delay the onset of symptoms    Increase knowledge of respiratory medications and ability to use respiratory devices properly  Yes    Intervention Provide education and demonstration as needed of appropriate use of medications, inhalers, and oxygen therapy.    Expected Outcomes Short Term: Achieves understanding of medications use. Understands that oxygen is a medication prescribed by physician. Demonstrates appropriate use of inhaler and oxygen therapy.;Long Term: Maintain appropriate use of medications, inhalers, and  oxygen therapy.    Diabetes Yes    Intervention Provide education about signs/symptoms and action to take for hypo/hyperglycemia.;Provide education about proper nutrition, including hydration, and aerobic/resistive exercise prescription along with prescribed medications to achieve blood glucose in normal ranges: Fasting glucose 65-99 mg/dL    Expected Outcomes Short Term: Participant verbalizes understanding of the signs/symptoms and immediate care of hyper/hypoglycemia, proper foot care and importance of medication, aerobic/resistive exercise and nutrition plan for blood glucose control.;Long Term: Attainment of HbA1C < 7%.    Stress Yes    Intervention Offer individual and/or small group education and counseling on adjustment to heart disease, stress management and health-related lifestyle change. Teach and support self-help strategies.;Refer participants experiencing significant psychosocial distress to appropriate mental health specialists for further evaluation and treatment. When possible, include family members and significant others in education/counseling sessions.    Expected Outcomes Short Term: Participant demonstrates changes in health-related behavior, relaxation and other stress management skills, ability to obtain effective social support, and compliance with psychotropic medications if prescribed.;Long Term: Emotional wellbeing is indicated by absence of clinically significant psychosocial distress or social isolation.             Core  Components/Risk Factors/Patient Goals Review:    Core Components/Risk Factors/Patient Goals at Discharge (Final Review):    ITP Comments:   Comments: Dr. Rodman Pickle is Medical Director for Pulmonary Rehab at [facility].

## 2023-02-25 ENCOUNTER — Telehealth: Payer: Self-pay

## 2023-02-25 ENCOUNTER — Ambulatory Visit: Payer: Medicare Other | Admitting: Podiatry

## 2023-02-25 DIAGNOSIS — L97521 Non-pressure chronic ulcer of other part of left foot limited to breakdown of skin: Secondary | ICD-10-CM

## 2023-02-25 DIAGNOSIS — L84 Corns and callosities: Secondary | ICD-10-CM

## 2023-02-25 NOTE — Telephone Encounter (Signed)
PA request received via CMM for Hyoscyamine Sulfate 0.125MG  sublingual tablets  PA has been submitted via CMM to [facility] Medicare and is pending determination.  Key: [id number]

## 2023-02-25 NOTE — Progress Notes (Signed)
Subjective: No chief complaint on file.   57 year old female presents the office today for follow-up evaluation of a wound on her left foot.  She states that is doing "100% better".  She only has 2 small scabs noted on the little toes otherwise improved.  She has not seen any drainage or pus.  No swelling or redness.  Denies any fever or chills.  No other concerns.  Objective: AAO x3, NAD DP/PT pulses palpable bilaterally, CRT less than 3 seconds  LEFT: Preulcerative hyperkeratotic lesion noted along the hallux plantarly.  Upon debridement preulcerative there is no definitive skin breakdown.  No drainage or pus.  No fluctuance or crepitation there is no malodor.  On the lateral aspects of the third and second toe there are dry scabs.  On the second toe came off on the third toe still small scab remains there is no drainage or pus or any open sores.  No edema, erythema or signs of infection. No pain with calf compression, swelling, warmth, erythema bilaterally   Assessment: Left hallux ulceration- improving   Plan: -All treatment options discussed with the patient including all alternatives, risks, complications. -Debrided hyperkeratotic lesion on the hallux without any complications or bleeding.  Offloading. -Debrided loose scab without any complications or bleeding.  Overall her foot is doing much better.  Discussed keeping clean and dry.  Monitor any skin breakdown or any signs or symptoms of infection.  Return in about 4 weeks (around 03/25/2023) for ulcer left foot.  Trula Slade DPM

## 2023-02-26 ENCOUNTER — Encounter (HOSPITAL_COMMUNITY)
Admission: RE | Admit: 2023-02-26 | Discharge: 2023-02-26 | Disposition: A | Discharge status: Home / Self Care | Payer: Medicare Other | Source: Ambulatory Visit | Attending: Pulmonary Disease | Admitting: Pulmonary Disease

## 2023-02-26 VITALS — Wt 300.9 lb

## 2023-02-26 DIAGNOSIS — J9611 Chronic respiratory failure with hypoxia: Secondary | ICD-10-CM

## 2023-02-26 DIAGNOSIS — J4530 Mild persistent asthma, uncomplicated: Secondary | ICD-10-CM

## 2023-02-26 LAB — GLUCOSE, CAPILLARY
Glucose-Capillary: 101 mg/dL — ABNORMAL HIGH (ref 70–99)
Glucose-Capillary: 108 mg/dL — ABNORMAL HIGH (ref 70–99)
Glucose-Capillary: 89 mg/dL (ref 70–99)

## 2023-02-26 NOTE — Progress Notes (Signed)
Daily Session Note  Patient Details  Name: [name] MRN: [id number] Date of Birth: 08-25-1966 Referring Provider:   April Manson Pulmonary Rehab Walk Test from 02/20/2023 in [facility] for Heart, Vascular, & [place]  Referring Provider Dr. Elsworth Soho       Encounter Date: 02/26/2023  Check In:  Session Check In - 02/26/23 1224       Check-In   Supervising physician immediately available to respond to emergencies [facility] MD immediately available    Physician(s) Eric Form, NP    Location [facility]    Staff Present Janine Ores, RN, Quentin Ore, MS, ACSM-CEP, Exercise Physiologist;Other;Carlette Wilber Oliphant, RN, BSN    Virtual Visit No    Medication changes reported     No    Fall or balance concerns reported    No    Tobacco Cessation No Change    Warm-up and Cool-down Performed as group-led instruction    Resistance Training Performed Yes    VAD Patient? No    PAD/SET Patient? No      Pain Assessment   Currently in Pain? No/denies    Pain Score 0-No pain    Multiple Pain Sites No             Capillary Blood Glucose: Results for orders placed or performed during the hospital encounter of 02/20/23 (from the past 24 hour(s))  Glucose, capillary     Status: Abnormal   Collection Time: 02/26/23 10:34 AM  Result Value Ref Range   Glucose-Capillary 101 (H) 70 - 99 mg/dL  Glucose, capillary     Status: Abnormal   Collection Time: 02/26/23 11:02 AM  Result Value Ref Range   Glucose-Capillary 108 (H) 70 - 99 mg/dL  Glucose, capillary     Status: None   Collection Time: 02/26/23 11:31 AM  Result Value Ref Range   Glucose-Capillary 89 70 - 99 mg/dL     Exercise Prescription Changes - 02/26/23 1200       Response to Exercise   Blood Pressure (Admit) 122/70    Blood Pressure (Exercise) 132/70    Blood Pressure (Exit) 102/56    Heart Rate (Admit) 84 bpm    Heart Rate (Exercise) 105 bpm    Heart Rate (Exit) 64 bpm     Oxygen Saturation (Admit) 93 %    Oxygen Saturation (Exercise) 94 %    Oxygen Saturation (Exit) 95 %    Rating of Perceived Exertion (Exercise) 13    Perceived Dyspnea (Exercise) 1    Duration Progress to 10 minutes continuous walking  at current work load and total walking time to 30-45 min    Intensity THRR unchanged      Progression   Progression Continue to progress workloads to maintain intensity without signs/symptoms of physical distress.      Resistance Training   Training Prescription Yes    Weight red bands    Reps 10-15    Time 10 Minutes      NuStep   Level 1    SPM 60    Minutes 10   waited forCBG to increase   METs 2             Social History   Tobacco Use  Smoking Status Never  Smokeless Tobacco Never    Goals Met:  Proper associated with RPD/PD & O2 Sat Exercise tolerated well Strength training completed today  Goals Unmet:  Not Applicable  Comments: Service time is  from 1022 to 1148.    Dr. Rodman Pickle is Medical Director for Pulmonary Rehab at [facility].

## 2023-02-28 ENCOUNTER — Encounter (HOSPITAL_COMMUNITY)
Admission: RE | Admit: 2023-02-28 | Discharge: 2023-02-28 | Disposition: A | Discharge status: Home / Self Care | Payer: Medicare Other | Source: Ambulatory Visit | Attending: Pulmonary Disease | Admitting: Pulmonary Disease

## 2023-02-28 ENCOUNTER — Encounter (HOSPITAL_COMMUNITY): Payer: Medicare Other

## 2023-02-28 DIAGNOSIS — J4530 Mild persistent asthma, uncomplicated: Secondary | ICD-10-CM

## 2023-02-28 DIAGNOSIS — J9611 Chronic respiratory failure with hypoxia: Secondary | ICD-10-CM | POA: Diagnosis not present

## 2023-02-28 NOTE — Progress Notes (Signed)
Daily Session Note  Patient Details  Name: [name] MRN: [id number] Date of Birth: 1966-04-06 Referring Provider:   April Manson Pulmonary Rehab Walk Test from 02/20/2023 in [facility] for Heart, Vascular, & [place]  Referring Provider Dr. Elsworth Soho       Encounter Date: 02/28/2023  Check In:  Session Check In - 02/28/23 1208       Check-In   Supervising physician immediately available to respond to emergencies [facility] MD immediately available    Physician(s) Shalhoub    [facility]    Staff Present Janine Ores, RN, Quentin Ore, MS, ACSM-CEP, Exercise Physiologist;Other;Carlette Wilber Oliphant, RN, BSN    Virtual Visit No    Medication changes reported     No    Fall or balance concerns reported    No    Tobacco Cessation No Change    Warm-up and Cool-down Performed as group-led instruction    Resistance Training Performed Yes    VAD Patient? No    PAD/SET Patient? No      Pain Assessment   Currently in Pain? No/denies    Pain Score 0-No pain    Multiple Pain Sites No             Capillary Blood Glucose: No results found for this or any previous visit (from the past 24 hour(s)).    Social History   Tobacco Use  Smoking Status Never  Smokeless Tobacco Never    Goals Met:  Proper associated with RPD/PD & O2 Sat Exercise tolerated well No report of concerns or symptoms today Strength training completed today  Goals Unmet:  Not Applicable  Comments: Service time is from 1007 to 1137.    Dr. Rodman Pickle is Medical Director for Pulmonary Rehab at [facility].

## 2023-03-05 ENCOUNTER — Encounter (HOSPITAL_COMMUNITY): Payer: Medicare Other

## 2023-03-05 ENCOUNTER — Encounter (HOSPITAL_COMMUNITY)
Admission: RE | Admit: 2023-03-05 | Discharge: 2023-03-05 | Disposition: A | Discharge status: Home / Self Care | Payer: Medicare Other | Source: Ambulatory Visit | Attending: Pulmonary Disease | Admitting: Pulmonary Disease

## 2023-03-05 DIAGNOSIS — J9611 Chronic respiratory failure with hypoxia: Secondary | ICD-10-CM | POA: Diagnosis not present

## 2023-03-05 DIAGNOSIS — J4530 Mild persistent asthma, uncomplicated: Secondary | ICD-10-CM

## 2023-03-05 NOTE — Progress Notes (Signed)
Daily Session Note  Patient Details  Name: [name] MRN: [id number] Date of Birth: 11/03/1966 Referring Provider:   April Manson Pulmonary Rehab Walk Test from 02/20/2023 in [facility] for Heart, Vascular, & [facility]  Referring Provider Dr. Elsworth Soho       Encounter Date: 03/05/2023  Check In:  Session Check In - 03/05/23 1205       Check-In   Supervising physician immediately available to respond to emergencies [facility] MD immediately available    Physician(s) Nevada Crane, NP    [facility]    Staff Present Elmon Else, MS, ACSM-CEP, Exercise Physiologist;Christien Frankl Yevonne Pax, ACSM-CEP, Exercise Physiologist;Samantha [place], [place]ierks, LDN;Casey Olen Pel, RN, BSN    Virtual Visit No    Medication changes reported     No    Fall or balance concerns reported    No    Comments uses walker    Tobacco Cessation No Change    Warm-up and Cool-down Performed as group-led instruction    Resistance Training Performed Yes    VAD Patient? No      Pain Assessment   Currently in Pain? No/denies             Capillary Blood Glucose: No results found for this or any previous visit (from the past 24 hour(s)).    Social History   Tobacco Use  Smoking Status Never  Smokeless Tobacco Never    Goals Met:  Independence with exercise equipment Exercise tolerated well No report of concerns or symptoms today Strength training completed today  Goals Unmet:  Not Applicable  Comments: Service time is from 1019 to 1140.    Dr. Rodman Pickle is Medical Director for Pulmonary Rehab at [facility].

## 2023-03-07 ENCOUNTER — Encounter (HOSPITAL_COMMUNITY)
Admission: RE | Admit: 2023-03-07 | Discharge: 2023-03-07 | Disposition: A | Discharge status: Home / Self Care | Payer: Medicare Other | Source: Ambulatory Visit | Attending: Pulmonary Disease | Admitting: Pulmonary Disease

## 2023-03-07 ENCOUNTER — Encounter (HOSPITAL_COMMUNITY): Payer: Medicare Other

## 2023-03-07 DIAGNOSIS — J9611 Chronic respiratory failure with hypoxia: Secondary | ICD-10-CM | POA: Diagnosis not present

## 2023-03-07 DIAGNOSIS — J4530 Mild persistent asthma, uncomplicated: Secondary | ICD-10-CM

## 2023-03-07 LAB — GLUCOSE, CAPILLARY: Glucose-Capillary: 99 mg/dL (ref 70–99)

## 2023-03-07 NOTE — Progress Notes (Signed)
Pt came today for Pulmonary Rehab. Pt stated that on her way here she ate a yogurt, protein shake, 2 pieces of candy, and was currently eating peanut butter crackers. Her CBG monitor reported her sugar at 100. Per policy, BS needs to be 110 or greater. Pt ate additional snack and BS was checked 30 min after. BS on patient's own machine (finger stick) was 92, hospital machine (finger stick) 99. Per records from the last 2 weeks, pt's BS drops after exercise 10-20mg /dL. Pt sent home d/t low blood sugar. Advised patient to speak with her MD about her low blood sugars. Pt understands without assistance.

## 2023-03-12 ENCOUNTER — Encounter (HOSPITAL_COMMUNITY): Payer: Medicare Other

## 2023-03-12 ENCOUNTER — Encounter (HOSPITAL_COMMUNITY)
Admission: RE | Admit: 2023-03-12 | Discharge: 2023-03-12 | Disposition: A | Discharge status: Home / Self Care | Payer: Medicare Other | Source: Ambulatory Visit | Attending: Pulmonary Disease | Admitting: Pulmonary Disease

## 2023-03-12 VITALS — Wt 300.5 lb

## 2023-03-12 DIAGNOSIS — J9611 Chronic respiratory failure with hypoxia: Secondary | ICD-10-CM

## 2023-03-12 DIAGNOSIS — J4530 Mild persistent asthma, uncomplicated: Secondary | ICD-10-CM | POA: Diagnosis present

## 2023-03-12 NOTE — Telephone Encounter (Signed)
PT is calling to get an update on the prior authorization for hyoscyamine sulfate. Please advise

## 2023-03-12 NOTE — Telephone Encounter (Signed)
Any word on this status?

## 2023-03-12 NOTE — Progress Notes (Signed)
Daily Session Note  Patient Details  Name: [name] MRN: [id number] Date of Birth: [birth year] Referring Provider:   April Manson Pulmonary Rehab Walk Test from 02/20/2023 in [facility] for Heart, Vascular, & [place]  Referring Provider Dr. Elsworth Soho       Encounter Date: 03/12/2023  Check In:  Session Check In - 03/12/23 1121       Check-In   Supervising physician immediately available to respond to emergencies [facility] MD immediately available    Physician(s) Ambrose Pancoast, NP    [facility]    Staff Present Elmon Else, MS, ACSM-CEP, Exercise Physiologist;Randi Yevonne Pax, ACSM-CEP, Exercise Physiologist;Samantha [place], Fletcher, LDN;Casey Smith, RT;Carlette Wilber Oliphant, RN, BSN    Virtual Visit No    Medication changes reported     No    Comments reviewed medication list with patient    Fall or balance concerns reported    No    Comments uses walker    Tobacco Cessation No Change    Warm-up and Cool-down Performed as group-led instruction   blood sugar too low to exercise, sent home   Resistance Training Performed Yes    VAD Patient? No    PAD/SET Patient? No      Pain Assessment   Currently in Pain? No/denies    Multiple Pain Sites No             Capillary Blood Glucose: No results found for this or any previous visit (from the past 24 hour(s)).   Exercise Prescription Changes - 03/12/23 1500       Response to Exercise   Blood Pressure (Admit) 92/62    Blood Pressure (Exercise) 108/60    Blood Pressure (Exit) 102/62    Heart Rate (Admit) 71 bpm    Heart Rate (Exercise) 110 bpm    Heart Rate (Exit) 80 bpm    Oxygen Saturation (Admit) 96 %    Oxygen Saturation (Exercise) 95 %    Oxygen Saturation (Exit) 97 %    Rating of Perceived Exertion (Exercise) 15    Perceived Dyspnea (Exercise) 3    Duration Progress to 30 minutes of  aerobic without signs/symptoms of physical distress    Intensity THRR unchanged       Progression   Progression Continue to progress workloads to maintain intensity without signs/symptoms of physical distress.      Resistance Training   Training Prescription Yes    Weight red bands    Reps 10-15    Time 10 Minutes      NuStep   Level 1    SPM 60    Minutes 15    METs 2.2      Track   Laps 8    Minutes 15    METs 2.23             Social History   Tobacco Use  Smoking Status Never  Smokeless Tobacco Never    Goals Met:  Proper associated with RPD/PD & O2 Sat Exercise tolerated well No report of concerns or symptoms today Strength training completed today  Goals Unmet:  Not Applicable  Comments: Service time is from 1018 to 1150.    Dr. Rodman Pickle is Medical Director for Pulmonary Rehab at [facility].

## 2023-03-13 NOTE — Telephone Encounter (Signed)
Appeal done called [phone] option 2 and then 1. Ref # [id number] 7 day turn around

## 2023-03-14 ENCOUNTER — Encounter (HOSPITAL_COMMUNITY)
Admission: RE | Admit: 2023-03-14 | Discharge: 2023-03-14 | Disposition: A | Discharge status: Home / Self Care | Payer: Medicare Other | Source: Ambulatory Visit | Attending: Pulmonary Disease | Admitting: Pulmonary Disease

## 2023-03-14 ENCOUNTER — Encounter (HOSPITAL_COMMUNITY): Payer: Medicare Other

## 2023-03-14 DIAGNOSIS — J4530 Mild persistent asthma, uncomplicated: Secondary | ICD-10-CM

## 2023-03-14 DIAGNOSIS — J9611 Chronic respiratory failure with hypoxia: Secondary | ICD-10-CM | POA: Diagnosis not present

## 2023-03-14 NOTE — Progress Notes (Signed)
Daily Session Note  Patient Details  Name: [name] MRN: [id number] Date of Birth: 10-25-1966 Referring Provider:   April Manson Pulmonary Rehab Walk Test from 02/20/2023 in [facility] for Heart, Vascular, & [place]  Referring Provider Dr. Elsworth Soho       Encounter Date: 03/14/2023  Check In:  Session Check In - 03/14/23 1112       Check-In   Supervising physician immediately available to respond to emergencies [facility] MD immediately available    Physician(s) Odie Sera, PA    Staff Present Samantha [place], RD, Perlie Mayo, RN, BSN;Randi Reeve BS, ACSM-CEP, Exercise Physiologist;Jetta Gilford Rile BS, ACSM-CEP, Exercise Physiologist;Elfa Wooton Rosana Hoes, MS, ACSM-CEP, Exercise Physiologist;Casey Tamala Julian, RT    Virtual Visit No    Medication changes reported     No    Fall or balance concerns reported    No    Tobacco Cessation No Change    Warm-up and Cool-down Performed as group-led instruction    Resistance Training Performed Yes    VAD Patient? No    PAD/SET Patient? No      Pain Assessment   Currently in Pain? No/denies    Multiple Pain Sites No             Capillary Blood Glucose: No results found for this or any previous visit (from the past 24 hour(s)).    Social History   Tobacco Use  Smoking Status Never  Smokeless Tobacco Never    Goals Met:  Proper associated with RPD/PD & O2 Sat Independence with exercise equipment Exercise tolerated well No report of concerns or symptoms today Strength training completed today  Goals Unmet:  Not Applicable  Comments: Service time is from 1010 to 1145.    Dr. Rodman Pickle is Medical Director for Pulmonary Rehab at [facility].

## 2023-03-19 ENCOUNTER — Encounter (HOSPITAL_COMMUNITY)
Admission: RE | Admit: 2023-03-19 | Discharge: 2023-03-19 | Disposition: A | Discharge status: Home / Self Care | Payer: Medicare Other | Source: Ambulatory Visit | Attending: Pulmonary Disease | Admitting: Pulmonary Disease

## 2023-03-19 ENCOUNTER — Encounter (HOSPITAL_COMMUNITY): Payer: Medicare Other

## 2023-03-19 DIAGNOSIS — J9611 Chronic respiratory failure with hypoxia: Secondary | ICD-10-CM

## 2023-03-19 DIAGNOSIS — J4530 Mild persistent asthma, uncomplicated: Secondary | ICD-10-CM

## 2023-03-19 NOTE — Progress Notes (Signed)
Daily Session Note  Patient Details  Name: [name] MRN: [id number] Date of Birth: 1966/07/27 Referring Provider:   Doristine Devoid Pulmonary Rehab Walk Test from 02/20/2023 in [facility] for Heart, Vascular, & Lung Health  Referring Provider Dr. Vassie Loll       Encounter Date: 03/19/2023  Check In:  Session Check In - 03/19/23 1202       Check-In   Supervising physician immediately available to respond to emergencies [facility] MD immediately available    Physician(s) Oris Drone, PA    [facility]    Staff Present Samantha Belarus, RD, Dutch Gray, RN, BSN;Porche Steinberger BS, ACSM-CEP, Exercise Physiologist;Kaylee Earlene Plater, MS, ACSM-CEP, Exercise Physiologist;Casey Katrinka Blazing, Pennelope Bracken, RN, BSN    Virtual Visit No    Medication changes reported     No    Fall or balance concerns reported    No    Tobacco Cessation No Change    Warm-up and Cool-down Performed as group-led instruction    Resistance Training Performed Yes    VAD Patient? No    PAD/SET Patient? No      Pain Assessment   Currently in Pain? No/denies             Capillary Blood Glucose: No results found for this or any previous visit (from the past 24 hour(s)).    Social History   Tobacco Use  Smoking Status Never  Smokeless Tobacco Never    Goals Met:  Independence with exercise equipment Exercise tolerated well No report of concerns or symptoms today Strength training completed today  Goals Unmet:  Not Applicable  Comments: Service time is from 1004 to 1140.    Dr. Mechele Collin is Medical Director for Pulmonary Rehab at [facility].

## 2023-03-19 NOTE — Progress Notes (Signed)
Pt came to Pulmonary Rehab today. 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 Pt's CBG monitor 88, juice given and patient exercised for 30 min. With continuous BG monitoring. After 20 minutes BS dropped to 81, 2nd juice given, and BG came up to 88. When patient left BG was 98.

## 2023-03-20 ENCOUNTER — Other Ambulatory Visit (HOSPITAL_COMMUNITY): Payer: Self-pay

## 2023-03-20 NOTE — Progress Notes (Signed)
Pulmonary Individual Treatment Plan  Patient Details  Name: [name] MRN: [id number] Date of Birth: 1966-07-27 Referring Provider:   Doristine Devoid Pulmonary Rehab Walk Test from 02/20/2023 in [facility]  Referring Provider Dr. Vassie Loll       Initial Encounter Date:  Flowsheet Row Pulmonary Rehab Walk Test from 02/20/2023 in [facility]  Date 02/20/23       Visit Diagnosis: Chronic respiratory failure with hypoxia  Mild persistent asthma without complication  Patient's Home Medications on Admission:   Current Outpatient Medications:    acetaminophen (TYLENOL) 500 MG tablet, Take 1,000 mg by mouth every 6 (six) hours as needed for moderate pain or headache. , Disp: , Rfl:    albuterol (VENTOLIN HFA) 108 (90 Base) MCG/ACT inhaler, inhale 2 puffs DAILY, Disp: 6.7 g, Rfl: 11   ALPRAZolam (XANAX) 0.5 MG tablet, Take 1 tablet by mouth every 6 (six) hours as needed., Disp: , Rfl:    ammonium lactate (LAC-HYDRIN) 12 % lotion, APPLY TO ARMS   HANDS 2 TIMES A DAY ESPECIALLY AFTER BATH, Disp: , Rfl:    APPLE CIDER VINEGAR PO, Take 1 each by mouth daily. One gummy once a day, Disp: , Rfl:    ARTIFICIAL TEARS 0.2-0.2-1 % SOLN, See admin instructions., Disp: , Rfl:    ASPIRIN 81 PO, 81 tablets every day by oral route., Disp: , Rfl:    atorvastatin (LIPITOR) 20 MG tablet, Take 1 tablet by mouth daily., Disp: , Rfl:    Azelastine HCl 137 MCG/SPRAY SOLN, daily., Disp: , Rfl:    b complex vitamins tablet, Take 1 tablet by mouth daily., Disp: , Rfl:    Bayer Microlet Lancets lancets, 4 each by Other route 4 (four) times daily., Disp: , Rfl:    Bepotastine Besilate 1.5 % SOLN, 1 drop 2 (two) times daily., Disp: , Rfl:    bisoprolol (ZEBETA) 5 MG tablet, Take 1 tablet (5 mg total) by mouth daily., Disp: 90 tablet, Rfl: 3   Black Pepper-Turmeric (TURMERIC CURCUMIN) 04-999 MG CAPS,  See admin instructions. At bedtime, Disp: , Rfl:    cetirizine (ZYRTEC) 10 MG tablet, 1 tablet as needed, Disp: , Rfl:    clobetasol cream (TEMOVATE) 0.05 %, Apply 1 application topically 2 (two) times daily as needed. , Disp: , Rfl:    cloNIDine (CATAPRES - DOSED IN MG/24 HR) 0.1 mg/24hr patch, Place 1 patch (0.1 mg total) onto the skin once a week. Start in 2 weeks, Disp: 4 patch, Rfl: 12   Continuous Blood Gluc Sensor (FREESTYLE LIBRE 3 SENSOR) MISC, USE AS DIRECTED TO CHECK BLOOD SUGARS, Disp: , Rfl:    cyclobenzaprine (FLEXERIL) 10 MG tablet, Take 10 mg by mouth See admin instructions. Take 10 mg by mouth at bedtime. Take 10 mg by mouth every 8 hours as needed for muscle spasms, Disp: , Rfl:    D3-50 50000 units capsule, Take 50,000 Units by mouth every Saturday., Disp: , Rfl: 4   dapagliflozin propanediol (FARXIGA) 5 MG TABS tablet, 1 tablet, Disp: , Rfl:    diclofenac Sodium (VOLTAREN) 1 % GEL, APPLY 2 GRAMS TO AFFECTED AREA OF FOOT 2-4 TIMES DAILY, Disp: 100 g, Rfl: 0   dicyclomine (BENTYL) 10 MG capsule, Take 1 capsule (10 mg total) by mouth every 8 (eight) hours as needed for spasms., Disp: 120 capsule, Rfl: 11   diphenhydrAMINE (BENADRYL) 50 MG/ML injection, ,  Disp: , Rfl:    DULoxetine (CYMBALTA) 60 MG capsule, Take 60 mg by mouth daily., Disp: , Rfl:    fluticasone (FLONASE) 50 MCG/ACT nasal spray, Place 1 spray into both nostrils at bedtime., Disp: , Rfl:    fluticasone furoate-vilanterol (BREO ELLIPTA) 200-25 MCG/ACT AEPB, Inhale 1 puff into the lungs daily., Disp: 60 each, Rfl: 2   furosemide (LASIX) 80 MG tablet, Take 80 mg by mouth daily., Disp: , Rfl:    gabapentin (NEURONTIN) 300 MG capsule, Take 2 capsules (600 mg total) by mouth 3 (three) times daily., Disp: 540 capsule, Rfl: 1   GAMUNEX-C 20 GM/200ML SOLN, , Disp: , Rfl:    glucose blood (CONTOUR NEXT TEST) test strip, 4 each by Other route 4 (four) times daily., Disp: , Rfl:    HYDROcodone-acetaminophen (NORCO) 7.5-325 MG  tablet, Take 1-2 tablets by mouth every 6 (six) hours as needed for moderate pain (for pain.)., Disp: 40 tablet, Rfl: 0   hyoscyamine (LEVSIN SL) 0.125 MG SL tablet, PLACE 2 TABLETS UNDER TONGUE EVERY 4 HOURS AS NEEDED WITH MEALS., Disp: 120 tablet, Rfl: 11   Insulin Human (INSULIN PUMP) SOLN, Inject into the skin., Disp: , Rfl:    insulin regular human CONCENTRATED (HUMULIN R) 500 UNIT/ML injection, Inject 2 Units into the skin See admin instructions. 2.0 units /hour 0500-2359, 1.95units 0000-0459 with carb ratio 1:50 for meal time bolus, Disp: , Rfl:    ketoconazole (NIZORAL) 2 % cream, Apply 1 application topically 2 (two) times daily as needed (for toes). , Disp: , Rfl:    levothyroxine (SYNTHROID) 137 MCG tablet, Take 2 tablets by mouth daily., Disp: , Rfl:    lisinopril (ZESTRIL) 10 MG tablet, Take 1 tablet (10 mg total) by mouth daily., Disp: 90 tablet, Rfl: 3   metFORMIN (GLUCOPHAGE) 1000 MG tablet, 1 tablet with a meal, Disp: , Rfl:    metroNIDAZOLE (METROGEL) 1 % gel, Apply topically 2 (two) times daily., Disp: , Rfl:    Multiple Vitamin (MULTIVITAMIN) tablet, Take 1 tablet by mouth at bedtime. , Disp: , Rfl:    mupirocin ointment (BACTROBAN) 2 %, APPLY  OINTMENT TOPICALLY TO AFFECTED AREA TWICE DAILY, Disp: 22 g, Rfl: 0   NURTEC 75 MG TBDP, Take 1 tablet by mouth daily., Disp: , Rfl:    ondansetron (ZOFRAN-ODT) 8 MG disintegrating tablet, Take 1 tablet (8 mg total) by mouth every 8 (eight) hours as needed. Please call [phone] to schedule an office visit for more refills, Disp: 30 tablet, Rfl: 4   OZEMPIC, 1 MG/DOSE, 4 MG/3ML SOPN, Inject into the skin., Disp: , Rfl:    pantoprazole (PROTONIX) 40 MG tablet, Take 1 tablet (40 mg total) by mouth 2 (two) times daily., Disp: 180 tablet, Rfl: 4   PATADAY 0.1 % ophthalmic solution, 1 drop into affected eye Ophthalmic Twice a day for 30 days, Disp: , Rfl:    polyethylene glycol (MIRALAX / GLYCOLAX) 17 g packet, Take 17 g by mouth daily.,  Disp: 90 packet, Rfl: 3   PROCTOSOL HC 2.5 % rectal cream, Apply 1 application topically 2 (two) times daily as needed for hemorrhoids. , Disp: , Rfl: 2   sodium chloride 0.9 % infusion, Inject into the vein., Disp: , Rfl:    Spacer/Aero-Holding Chambers (AEROCHAMBER MV) inhaler, Use as instructed, Disp: 1 each, Rfl: 0   Urea (UREA NAIL) 45 % GEL, Apply 1 Application topically daily., Disp: 28 mL, Rfl: 1   VRAYLAR 1.5 MG capsule, Take 1.5 mg by  mouth daily., Disp: , Rfl:   Past Medical History: Past Medical History:  Diagnosis Date   Anginal pain (HCC)    chest pain  heart cath done   Anxiety    Cancer [facility])    thyroid cancer   Chronic bronchitis (HCC)    "get it q yr"   Chronic inflammatory demyelinating polyneuropathy (HCC)    Chronic kidney disease    Proteinuria   Dyspnea    sob with exertion   Early cataract    right   Fibromyalgia    GERD (gastroesophageal reflux disease)    Heart murmur    Hepatic adenoma    High cholesterol    History of gout    History of hiatal hernia    History of kidney stones    Hypertension    Hypothyroidism    IBS (irritable bowel syndrome)    Iron deficiency anemia    "comes and goes"   Migraine    "@ least once/month" (02/18/2015)   Obesity    PONV (postoperative nausea and vomiting)    patch and anti nausea meds work well   Proteinuria    Spinal headache    Syncope    Tachycardia    Type II diabetes mellitus (HCC)    pt has an insulin pump   Wears glasses     Tobacco Use: Social History   Tobacco Use  Smoking Status Never  Smokeless Tobacco Never    Labs: Review Flowsheet  More data may exist      Latest Ref Rng & Units 07/01/2009 02/19/2015 03/29/2016 09/26/2017 04/03/2018  Labs for ITP Cardiac and Pulmonary Rehab  Cholestrol 0 - 200 mg/dL 161        ATP III CLASSIFICATION:  <200     mg/dL   Desirable  096-045  mg/dL   Borderline High  >=409    mg/dL   High         - - - -  LDL (calc) 0 - 99 mg/dL 85        Total  Cholesterol/HDL:CHD Risk Coronary Heart Disease Risk Table                     Men   Women  1/2 Average Risk   3.4   3.3  Average Risk       5.0   4.4  2 X Average Risk   9.6   7.1  3 X Average Risk  23.4   11.0        Use the calculated Patient Ratio above and the CHD Risk Table to determine the patient's CHD Risk.        ATP III CLASSIFICATION (LDL):  <100     mg/dL   Optimal  811-914  mg/dL   Near or Above                    Optimal  130-159  mg/dL   Borderline  782-956  mg/dL   High  >213     mg/dL   Very High  - - - -  HDL-C >39 mg/dL 56  - - - -  Trlycerides <150 mg/dL 086  - - - -  Hemoglobin A1c 4.8 - 5.6 % - 7.7  6.8  7.7  6.8   TCO2 0 - 100 mmol/L 21  - - - -    Capillary Blood Glucose: Lab Results  Component Value Date   GLUCAP 99 03/07/2023  GLUCAP 89 02/26/2023   GLUCAP 108 (H) 02/26/2023   GLUCAP 101 (H) 02/26/2023   GLUCAP 134 (H) 09/22/2018    POCT Glucose     Row Name 02/20/23 1056             POCT Blood Glucose   Pre-Exercise 72 mg/dL  Snack provided to patient       Post-Exercise 96 mg/dL       Pre-Exercise #2 89 mg/dL                Pulmonary Assessment Scores:  Pulmonary Assessment Scores     Row Name 02/20/23 1143 02/20/23 1220       ADL UCSD   SOB Score total 61 --      CAT Score   CAT Score 28 --      mMRC Score   mMRC Score -- 2            UCSD: Self-administered rating of dyspnea associated with activities of daily living (ADLs) 6-point scale (0 = "not at all" to 5 = "maximal or unable to do because of breathlessness")  Scoring Scores range from 0 to 120.  Minimally important difference is 5 units  CAT: CAT can identify the health impairment of COPD patients and is better correlated with disease progression.  CAT has a scoring range of zero to 40. The CAT score is classified into four groups of low (less than 10), medium (10 - 20), high (21-30) and very high (31-40) based on the impact level of disease on  health status. A CAT score over 10 suggests significant symptoms.  A worsening CAT score could be explained by an exacerbation, poor medication adherence, poor inhaler technique, or progression of COPD or comorbid conditions.  CAT MCID is 2 points  mMRC: mMRC (Modified Medical Research Council) Dyspnea Scale is used to assess the degree of baseline functional disability in patients of respiratory disease due to dyspnea. No minimal important difference is established. A decrease in score of 1 point or greater is considered a positive change.   Pulmonary Function Assessment:  Pulmonary Function Assessment - 02/20/23 1315       Breath   Bilateral Breath Sounds Clear   RLL diminished   Shortness of Breath Yes;Fear of Shortness of Breath;Limiting activity             Exercise Target Goals: Exercise Program Goal: Individual exercise prescription set using results from initial 6 min walk test and THRR while considering  patient's activity barriers and safety.   Exercise Prescription Goal: Initial exercise prescription builds to 30-45 minutes a day of aerobic activity, 2-3 days per week.  Home exercise guidelines will be given to patient during program as part of exercise prescription that the participant will acknowledge.  Activity Barriers & Risk Stratification:  Activity Barriers & Cardiac Risk Stratification - 02/20/23 1057       Activity Barriers & Cardiac Risk Stratification   Activity Barriers Deconditioning;Muscular Weakness;Shortness of Breath;Assistive Device;Joint Problems;Back Problems   No squats, kneeling, weight restrictions 10lbs or less            6 Minute Walk:  6 Minute Walk     Row Name 02/20/23 1212         6 Minute Walk   Phase Initial     Distance 812 feet     Walk Time 6 minutes     # of Rest Breaks 1  2:43-3:40     MPH 1.54  METS 1.92     RPE 17     Perceived Dyspnea  2     VO2 Peak 6.71     Resting HR 82 bpm     Resting BP 102/60      Resting Oxygen Saturation  96 %     Exercise Oxygen Saturation  during 6 min walk 93 %     Max Ex. HR 132 bpm     Max Ex. BP 122/64     2 Minute Post BP 112/68       Interval HR   1 Minute HR 111     2 Minute HR 124     3 Minute HR 126     4 Minute HR 115     5 Minute HR 119     6 Minute HR 132     2 Minute Post HR 84     Interval Heart Rate? Yes       Interval Oxygen   Interval Oxygen? Yes     Baseline Oxygen Saturation % 96 %     1 Minute Oxygen Saturation % 98 %     1 Minute Liters of Oxygen 0 L     2 Minute Oxygen Saturation % 94 %     2 Minute Liters of Oxygen 0 L     3 Minute Oxygen Saturation % 96 %     3 Minute Liters of Oxygen 0 L     4 Minute Oxygen Saturation % 95 %     4 Minute Liters of Oxygen 0 L     5 Minute Oxygen Saturation % 93 %     5 Minute Liters of Oxygen 0 L     6 Minute Oxygen Saturation % 94 %     6 Minute Liters of Oxygen 0 L     2 Minute Post Oxygen Saturation % 96 %     2 Minute Post Liters of Oxygen 0 L              Oxygen Initial Assessment:  Oxygen Initial Assessment - 02/20/23 1219       Initial 6 min Walk   Oxygen Used None      Program Oxygen Prescription   Program Oxygen Prescription None      Intervention   Short Term Goals To learn and understand importance of maintaining oxygen saturations>88%;To learn and demonstrate proper pursed lip breathing techniques or other breathing techniques. ;To learn and demonstrate proper use of respiratory medications    Long  Term Goals Exhibits proper breathing techniques, such as pursed lip breathing or other method taught during program session;Compliance with respiratory medication;Demonstrates proper use of MDI's;Maintenance of O2 saturations>88%             Oxygen Re-Evaluation:  Oxygen Re-Evaluation     Row Name 03/11/23 0849             Program Oxygen Prescription   Program Oxygen Prescription None         Home Oxygen   Home Oxygen Device None       Sleep Oxygen  Prescription BiPAP;None       Home Exercise Oxygen Prescription None       Home Resting Oxygen Prescription None         Goals/Expected Outcomes   Short Term Goals To learn and understand importance of maintaining oxygen saturations>88%;To learn and demonstrate proper pursed lip breathing techniques or other breathing techniques. ;To learn and demonstrate  proper use of respiratory medications       Long  Term Goals Exhibits proper breathing techniques, such as pursed lip breathing or other method taught during program session;Compliance with respiratory medication;Demonstrates proper use of MDI's;Maintenance of O2 saturations>88%       Goals/Expected Outcomes Compliance and understanding of oxygen saturation monitoring and breathing techniques to decrease shortness of breath.                Oxygen Discharge (Final Oxygen Re-Evaluation):  Oxygen Re-Evaluation - 03/11/23 0849       Program Oxygen Prescription   Program Oxygen Prescription None      Home Oxygen   Home Oxygen Device None    Sleep Oxygen Prescription BiPAP;None    Home Exercise Oxygen Prescription None    Home Resting Oxygen Prescription None      Goals/Expected Outcomes   Short Term Goals To learn and understand importance of maintaining oxygen saturations>88%;To learn and demonstrate proper pursed lip breathing techniques or other breathing techniques. ;To learn and demonstrate proper use of respiratory medications    Long  Term Goals Exhibits proper breathing techniques, such as pursed lip breathing or other method taught during program session;Compliance with respiratory medication;Demonstrates proper use of MDI's;Maintenance of O2 saturations>88%    Goals/Expected Outcomes Compliance and understanding of oxygen saturation monitoring and breathing techniques to decrease shortness of breath.             Initial Exercise Prescription:  Initial Exercise Prescription - 02/20/23 1300       Date of Initial  Exercise RX and Referring Provider   Date 02/20/23    Referring Provider Dr. Vassie Loll    Expected Discharge Date 05/16/23      Recumbant Elliptical   Level 1    Minutes 15      Track   Laps 8    Minutes 15      Prescription Details   Frequency (times per week) 2    Duration Progress to 30 minutes of continuous aerobic without signs/symptoms of physical distress      Intensity   THRR 40-80% of Max Heartrate 66-132    Ratings of Perceived Exertion 11-13    Perceived Dyspnea 0-4      Progression   Progression Continue to progress workloads to maintain intensity without signs/symptoms of physical distress.      Resistance Training   Training Prescription Yes    Weight red bands    Reps 10-15             Perform Capillary Blood Glucose checks as needed.  Exercise Prescription Changes:   Exercise Prescription Changes     Row Name 02/26/23 1200 03/12/23 1500           Response to Exercise   Blood Pressure (Admit) 122/70 92/62      Blood Pressure (Exercise) 132/70 108/60      Blood Pressure (Exit) 102/56 102/62      Heart Rate (Admit) 84 bpm 71 bpm      Heart Rate (Exercise) 105 bpm 110 bpm      Heart Rate (Exit) 64 bpm 80 bpm      Oxygen Saturation (Admit) 93 % 96 %      Oxygen Saturation (Exercise) 94 % 95 %      Oxygen Saturation (Exit) 95 % 97 %      Rating of Perceived Exertion (Exercise) 13 15      Perceived Dyspnea (Exercise) 1 3  Duration Progress to 10 minutes continuous walking  at current work load and total walking time to 30-45 min Progress to 30 minutes of  aerobic without signs/symptoms of physical distress      Intensity THRR unchanged THRR unchanged        Progression   Progression Continue to progress workloads to maintain intensity without signs/symptoms of physical distress. Continue to progress workloads to maintain intensity without signs/symptoms of physical distress.        Resistance Training   Training Prescription Yes Yes       Weight red bands red bands      Reps 10-15 10-15      Time 10 Minutes 10 Minutes        NuStep   Level 1 1      SPM 60 60      Minutes 10  waited forCBG to increase 15      METs 2 2.2        Track   Laps -- 8      Minutes -- 15      METs -- 2.23               Exercise Comments:   Exercise Comments     Row Name 02/28/23 1209           Exercise Comments Pt completed second day of exercise. She exercised for 15 min on the track and Nustep. [name] averaged 1.77 METs on the track and 1.9 METs at level 1 on the Nustep. She performs the warmup and cooldown standing/seated dependent on her shortness of breath. Discussed METs and how to increase METs.                Exercise Goals and Review:   Exercise Goals     Row Name 02/20/23 1111 03/11/23 0843           Exercise Goals   Increase Physical Activity Yes Yes      Intervention Provide advice, education, support and counseling about physical activity/exercise needs.;Develop an individualized exercise prescription for aerobic and resistive training based on initial evaluation findings, risk stratification, comorbidities and participant's personal goals. Provide advice, education, support and counseling about physical activity/exercise needs.;Develop an individualized exercise prescription for aerobic and resistive training based on initial evaluation findings, risk stratification, comorbidities and participant's personal goals.      Expected Outcomes Short Term: Attend rehab on a regular basis to increase amount of physical activity.;Long Term: Exercising regularly at least 3-5 days a week.;Long Term: Add in home exercise to make exercise part of routine and to increase amount of physical activity. Short Term: Attend rehab on a regular basis to increase amount of physical activity.;Long Term: Exercising regularly at least 3-5 days a week.;Long Term: Add in home exercise to make exercise part of routine and to increase amount  of physical activity.      Increase Strength and Stamina Yes Yes      Intervention Provide advice, education, support and counseling about physical activity/exercise needs.;Develop an individualized exercise prescription for aerobic and resistive training based on initial evaluation findings, risk stratification, comorbidities and participant's personal goals. Provide advice, education, support and counseling about physical activity/exercise needs.;Develop an individualized exercise prescription for aerobic and resistive training based on initial evaluation findings, risk stratification, comorbidities and participant's personal goals.      Expected Outcomes Short Term: Increase workloads from initial exercise prescription for resistance, speed, and METs.;Short Term: Perform resistance training exercises routinely during rehab  and add in resistance training at home;Long Term: Improve cardiorespiratory fitness, muscular endurance and strength as measured by increased METs and functional capacity ( ) Short Term: Increase workloads from initial exercise prescription for resistance, speed, and METs.;Short Term: Perform resistance training exercises routinely during rehab and add in resistance training at home;Long Term: Improve cardiorespiratory fitness, muscular endurance and strength as measured by increased METs and functional capacity ( )      Able to understand and use rate of perceived exertion (RPE) scale Yes Yes      Intervention Provide education and explanation on how to use RPE scale Provide education and explanation on how to use RPE scale      Expected Outcomes Short Term: Able to use RPE daily in rehab to express subjective intensity level;Long Term:  Able to use RPE to guide intensity level when exercising independently Short Term: Able to use RPE daily in rehab to express subjective intensity level;Long Term:  Able to use RPE to guide intensity level when exercising independently      Able to  understand and use Dyspnea scale Yes Yes      Intervention Provide education and explanation on how to use Dyspnea scale Provide education and explanation on how to use Dyspnea scale      Expected Outcomes Short Term: Able to use Dyspnea scale daily in rehab to express subjective sense of shortness of breath during exertion;Long Term: Able to use Dyspnea scale to guide intensity level when exercising independently Short Term: Able to use Dyspnea scale daily in rehab to express subjective sense of shortness of breath during exertion;Long Term: Able to use Dyspnea scale to guide intensity level when exercising independently      Knowledge and understanding of Target Heart Rate Range (THRR) Yes Yes      Intervention Provide education and explanation of THRR including how the numbers were predicted and where they are located for reference Provide education and explanation of THRR including how the numbers were predicted and where they are located for reference      Expected Outcomes Short Term: Able to state/look up THRR;Short Term: Able to use daily as guideline for intensity in rehab;Long Term: Able to use THRR to govern intensity when exercising independently Short Term: Able to state/look up THRR;Short Term: Able to use daily as guideline for intensity in rehab;Long Term: Able to use THRR to govern intensity when exercising independently      Understanding of Exercise Prescription Yes Yes      Intervention Provide education, explanation, and written materials on patient's individual exercise prescription Provide education, explanation, and written materials on patient's individual exercise prescription      Expected Outcomes Short Term: Able to explain program exercise prescription;Long Term: Able to explain home exercise prescription to exercise independently Short Term: Able to explain program exercise prescription;Long Term: Able to explain home exercise prescription to exercise independently                Exercise Goals Re-Evaluation :  Exercise Goals Re-Evaluation     Row Name 03/11/23 0844             Exercise Goal Re-Evaluation   Exercise Goals Review Increase Physical Activity;Able to understand and use Dyspnea scale;Understanding of Exercise Prescription;Increase Strength and Stamina;Knowledge and understanding of Target Heart Rate Range (THRR);Able to understand and use rate of perceived exertion (RPE) scale       Comments Jenna Jester has completed 2 full exercise sessions. Jenna Jester has had issues with her blood  sugar being too low. [name] exercises on the track and Nustep for 15 min. She averages 1.92 METs on the track and 2.0 METs at level 1 on the Nustep. Jenna Jester performs the warmup and cooldown mostly standing except for squats. She does an alternative exercise for sqauts. It is difficult to notate any discernable progressions since she has only completed 2 sessions. Will continue to monitor and progress able.       Expected Outcomes Through exercise at rehab and home, the patient will decrease shortness of breath with daily activities and feel confident in carrying out an exercise regimen at home.                Discharge Exercise Prescription (Final Exercise Prescription Changes):  Exercise Prescription Changes - 03/12/23 1500       Response to Exercise   Blood Pressure (Admit) 92/62    Blood Pressure (Exercise) 108/60    Blood Pressure (Exit) 102/62    Heart Rate (Admit) 71 bpm    Heart Rate (Exercise) 110 bpm    Heart Rate (Exit) 80 bpm    Oxygen Saturation (Admit) 96 %    Oxygen Saturation (Exercise) 95 %    Oxygen Saturation (Exit) 97 %    Rating of Perceived Exertion (Exercise) 15    Perceived Dyspnea (Exercise) 3    Duration Progress to 30 minutes of  aerobic without signs/symptoms of physical distress    Intensity THRR unchanged      Progression   Progression Continue to progress workloads to maintain intensity without signs/symptoms of physical  distress.      Resistance Training   Training Prescription Yes    Weight red bands    Reps 10-15    Time 10 Minutes      NuStep   Level 1    SPM 60    Minutes 15    METs 2.2      Track   Laps 8    Minutes 15    METs 2.23             Nutrition:  Target Goals: Understanding of nutrition guidelines, daily intake of sodium 1500mg , cholesterol 200mg , calories 30% from fat and 7% or less from saturated fats, daily to have 5 or more servings of fruits and vegetables.  Biometrics:  Pre Biometrics - 02/20/23 1217       Pre Biometrics   Grip Strength 22 kg              Nutrition Therapy Plan and Nutrition Goals:  Nutrition Therapy & Goals - 02/26/23 1243       Nutrition Therapy   Diet Heart healthy/carbohydrate consistent diet    Drug/Food Interactions Statins/Certain Fruits      Personal Nutrition Goals   Nutrition Goal Patient to improve diet quality by using the plate method as a guide for meal planning to include lean protein/plant protein, fruits, vegetables, whole grains, nonfat dairy as part of a well-balanced diet.    Personal Goal #2 Patient to identify strategies for weight loss of 0.5-2.0# per week.    Comments Ashanti has started making some dietary changes including increased protein and decreased portion sizes; she started Ozempic for weight loss and blood sugar control in November 2023 at 309#. She reports am/fasting blood sugars <75 and some instances of overeating carbohydrates or eating in the middle of the night to sustain blood sugar levels. She will follow-up with PCP this week . Pheobe will benefit from participation in pulmonary  rehab for nutrition, exercise, and lifestyle modification.      Intervention Plan   Intervention Nutrition handout(s) given to patient.;Prescribe, educate and counsel regarding individualized specific dietary modifications aiming towards targeted core components such as weight, hypertension, lipid management, diabetes, heart  failure and other comorbidities.    Expected Outcomes Short Term Goal: Understand basic principles of dietary content, such as calories, fat, sodium, cholesterol and nutrients.;Long Term Goal: Adherence to prescribed nutrition plan.             Nutrition Assessments:  Nutrition Assessments - 02/26/23 1350       Rate Your Plate Scores   Pre Score 57            MEDIFICTS Score Key: ?70 Need to make dietary changes  40-70 Heart Healthy Diet ? 40 Therapeutic Level Cholesterol Diet  Flowsheet Row PULMONARY REHAB OTHER RESPIRATORY from 02/26/2023 in [facility]  Picture Your Plate Total Score on Admission 57      Picture Your Plate Scores: <16 Unhealthy dietary pattern with much room for improvement. 41-50 Dietary pattern unlikely to meet recommendations for good health and room for improvement. 51-60 More healthful dietary pattern, with some room for improvement.  >60 Healthy dietary pattern, although there may be some specific behaviors that could be improved.    Nutrition Goals Re-Evaluation:  Nutrition Goals Re-Evaluation     Row Name 02/26/23 1243             Goals   Current Weight 300 lb 14.9 oz (136.5 kg)       Comment lipids WNL, A1c 5.3, hx of fatty liver- AST 47, ALT 48 followed by gastro       Expected Outcome Jenna Jester has started making some dietary changes including increased protein and decreased portion sizes; she started Ozempic for weight loss and blood sugar control in November 2023 at 309#. She reports am/fasting blood sugars <75 and some instances of overeating carbohydrates or eating in the middle of the night to sustain blood sugar levels. She will follow-up with PCP this week . Isbella will benefit from participation in pulmonary rehab for nutrition, exercise, and lifestyle modification.                Nutrition Goals Discharge (Final Nutrition Goals Re-Evaluation):  Nutrition Goals  Re-Evaluation - 02/26/23 1243       Goals   Current Weight 300 lb 14.9 oz (136.5 kg)    Comment lipids WNL, A1c 5.3, hx of fatty liver- AST 47, ALT 48 followed by gastro    Expected Outcome Jenna Jester has started making some dietary changes including increased protein and decreased portion sizes; she started Ozempic for weight loss and blood sugar control in November 2023 at 309#. She reports am/fasting blood sugars <75 and some instances of overeating carbohydrates or eating in the middle of the night to sustain blood sugar levels. She will follow-up with PCP this week . Kenzleigh will benefit from participation in pulmonary rehab for nutrition, exercise, and lifestyle modification.             Psychosocial: Target Goals: Acknowledge presence or absence of significant depression and/or stress, maximize coping skills, provide positive support system. Participant is able to verbalize types and ability to use techniques and skills needed for reducing stress and depression.  Initial Review & Psychosocial Screening:  Initial Psych Review & Screening - 02/20/23 1111       Initial Review  Current issues with History of Depression;Current Psychotropic Meds;Current Stress Concerns    Source of Stress Concerns Family;Chronic Illness    Comments Pt states more stress due to mother in law moving, her health, and her shortness of breath      Family Dynamics   Good Support System? Yes      Barriers   Psychosocial barriers to participate in program The patient should benefit from training in stress management and relaxation.      Screening Interventions   Interventions Encouraged to exercise;To provide support and resources with identified psychosocial needs;Provide feedback about the scores to participant    Expected Outcomes Long Term Goal: Stressors or current issues are controlled or eliminated.;Short Term goal: Identification and review with participant of any Quality of Life or Depression concerns  found by scoring the questionnaire.;Long Term goal: The participant improves quality of Life and PHQ9 Scores as seen by post scores and/or verbalization of changes             Quality of Life Scores:  Scores of 19 and below usually indicate a poorer quality of life in these areas.  A difference of  2-3 points is a clinically meaningful difference.  A difference of 2-3 points in the total score of the Quality of Life Index has been associated with significant improvement in overall quality of life, self-image, physical symptoms, and general health in studies assessing change in quality of life.  PHQ-9: Review Flowsheet       02/20/2023 06/17/2017 02/23/2016  Depression screen PHQ 2/9  Decreased Interest 1 0 2  Down, Depressed, Hopeless 1 0 0  PHQ - 2 Score 2 0 2  Altered sleeping 1 - 3  Tired, decreased energy 3 - 3  Change in appetite 1 - 1  Feeling bad or failure about yourself  0 - 2  Trouble concentrating 1 - 0  Moving slowly or fidgety/restless 0 - 3  Suicidal thoughts 0 - 0  PHQ-9 Score 8 - 14  Difficult doing work/chores Somewhat difficult - Extremely dIfficult   Interpretation of Total Score  Total Score Depression Severity:  1-4 = Minimal depression, 5-9 = Mild depression, 10-14 = Moderate depression, 15-19 = Moderately severe depression, 20-27 = Severe depression   Psychosocial Evaluation and Intervention:  Psychosocial Evaluation - 02/20/23 1116       Psychosocial Evaluation & Interventions   Interventions Relaxation education    Comments [name] states that she is stressed about her mother-in-law moving in with her and has stress about her health. [name] is currently on psychotropic meds that keep her mental health stable    Expected Outcomes For [name] to participate in PR without any psychosocial barriers or concerns    Continue Psychosocial Services  Follow up required by staff   We will continue to monitor [name] for any needs that may arise             Psychosocial Re-Evaluation:  Psychosocial Re-Evaluation     Row Name 03/13/23 1119             Psychosocial Re-Evaluation   Current issues with Current Depression;History of Depression;Current Psychotropic Meds;Current Stress Concerns       Comments [name] has been dealing with stress and depression. She is depressed about her health diagnosis and unable to do things she has done prior, like driving a car. Her mother-in-law moving in is also causing her stress. She is compliant in taking her psychotropic meds and feels her mental health is "  ok". She currently declines a referral to see a therapist. Another issue is getting her blood sugars within a normal range. She has been working with her medical team to correct this issue. It is not perfect, but small changes are being made.       Expected Outcomes For Jenna Jester to continue in PR with decreased stress and normal blood sugars       Interventions Encouraged to attend Pulmonary Rehabilitation for the exercise;Stress management education       Continue Psychosocial Services  Follow up required by staff  We will continue to monitor Jenna Jester for any barriers that may arise                Psychosocial Discharge (Final Psychosocial Re-Evaluation):  Psychosocial Re-Evaluation - 03/13/23 1119       Psychosocial Re-Evaluation   Current issues with Current Depression;History of Depression;Current Psychotropic Meds;Current Stress Concerns    Comments Jenna Jester has been dealing with stress and depression. She is depressed about her health diagnosis and unable to do things she has done prior, like driving a car. Her mother-in-law moving in is also causing her stress. She is compliant in taking her psychotropic meds and feels her mental health is "ok". She currently declines a referral to see a therapist. Another issue is getting her blood sugars within a normal range. She has been working with her medical team to correct this issue. It is not  perfect, but small changes are being made.    Expected Outcomes For Jenna Jester to continue in PR with decreased stress and normal blood sugars    Interventions Encouraged to attend Pulmonary Rehabilitation for the exercise;Stress management education    Continue Psychosocial Services  Follow up required by staff   We will continue to monitor Jenna Jester for any barriers that may arise            Education: Education Goals: Education classes will be provided on a weekly basis, covering required topics. Participant will state understanding/return demonstration of topics presented.  Learning Barriers/Preferences:  Learning Barriers/Preferences - 02/20/23 1117       Learning Barriers/Preferences   Learning Barriers Sight;Exercise Concerns    Learning Preferences Individual Instruction;Group Instruction;Verbal Instruction             Education Topics: Introduction to Pulmonary Rehab Group instruction provided by PowerPoint, verbal discussion, and written material to support subject matter. Instructor reviews what Pulmonary Rehab is, the purpose of the program, and how patients are referred.     Know Your Numbers Group instruction that is supported by a PowerPoint presentation. Instructor discusses importance of knowing and understanding resting, exercise, and post-exercise oxygen saturation, heart rate, and blood pressure. Oxygen saturation, heart rate, blood pressure, rating of perceived exertion, and dyspnea are reviewed along with a normal range for these values.    Exercise for the Pulmonary Patient Group instruction that is supported by a PowerPoint presentation. Instructor discusses benefits of exercise, core components of exercise, frequency, duration, and intensity of an exercise routine, importance of utilizing pulse oximetry during exercise, safety while exercising, and options of places to exercise outside of rehab.  Flowsheet Row PULMONARY REHAB OTHER RESPIRATORY from 03/14/2023  in [facility], [facility], [facility]  Date 03/14/23  Educator EP  Instruction Review Code 1- Verbalizes Understanding          MET Level  Group instruction provided by PowerPoint, verbal discussion, and written material to support subject matter. Instructor reviews what  METs are and how to increase METs.  Flowsheet Row PULMONARY REHAB OTHER RESPIRATORY from 02/28/2023 in [facility], [facility]  Date 02/28/23  Educator EP  Instruction Review Code 1- Verbalizes Understanding       Pulmonary Medications Verbally interactive group education provided by instructor with focus on inhaled medications and proper administration.   Anatomy and Physiology of the Respiratory System Group instruction provided by PowerPoint, verbal discussion, and written material to support subject matter. Instructor reviews respiratory cycle and anatomical components of the respiratory system and their functions. Instructor also reviews differences in obstructive and restrictive respiratory diseases with examples of each.    Oxygen Safety Group instruction provided by PowerPoint, verbal discussion, and written material to support subject matter. There is an overview of "What is Oxygen" and "Why do we need it".  Instructor also reviews how to create a safe environment for oxygen use, the importance of using oxygen as prescribed, and the risks of noncompliance. There is a brief discussion on traveling with oxygen and resources the patient may utilize.   Oxygen Use Group instruction provided by PowerPoint, verbal discussion, and written material to discuss how supplemental oxygen is prescribed and different types of oxygen supply systems. Resources for more information are provided.    Breathing Techniques Group instruction that is supported by demonstration and informational handouts. Instructor discusses the benefits of pursed  lip and diaphragmatic breathing and detailed demonstration on how to perform both.     Risk Factor Reduction Group instruction that is supported by a PowerPoint presentation. Instructor discusses the definition of a risk factor, different risk factors for pulmonary disease, and how the heart and lungs work together.   MD Day A group question and answer session with a medical doctor that allows participants to ask questions that relate to their pulmonary disease state.   Nutrition for the Pulmonary Patient Group instruction provided by PowerPoint slides, verbal discussion, and written materials to support subject matter. The instructor gives an explanation and review of healthy diet recommendations, which includes a discussion on weight management, recommendations for fruit and vegetable consumption, as well as protein, fluid, caffeine, fiber, sodium, sugar, and alcohol. Tips for eating when patients are short of breath are discussed.    Other Education Group or individual verbal, written, or video instructions that support the educational goals of the pulmonary rehab program.    Knowledge Questionnaire Score:  Knowledge Questionnaire Score - 02/20/23 1142       Knowledge Questionnaire Score   Pre Score 17/18             Core Components/Risk Factors/Patient Goals at Admission:  Personal Goals and Risk Factors at Admission - 02/20/23 1117       Core Components/Risk Factors/Patient Goals on Admission    Weight Management Obesity;Weight Loss    Improve shortness of breath with ADL's Yes    Intervention Provide education, individualized exercise plan and daily activity instruction to help decrease symptoms of SOB with activities of daily living.    Expected Outcomes Short Term: Improve cardiorespiratory fitness to achieve a reduction of symptoms when performing ADLs;Long Term: Be able to perform more ADLs without symptoms or delay the onset of symptoms    Increase knowledge of  respiratory medications and ability to use respiratory devices properly  Yes    Intervention Provide education and demonstration as needed of appropriate use of medications, inhalers, and oxygen therapy.    Expected Outcomes Short Term: Achieves  understanding of medications use. Understands that oxygen is a medication prescribed by physician. Demonstrates appropriate use of inhaler and oxygen therapy.;Long Term: Maintain appropriate use of medications, inhalers, and oxygen therapy.    Diabetes Yes    Intervention Provide education about signs/symptoms and action to take for hypo/hyperglycemia.;Provide education about proper nutrition, including hydration, and aerobic/resistive exercise prescription along with prescribed medications to achieve blood glucose in normal ranges: Fasting glucose 65-99 mg/dL    Expected Outcomes Short Term: Participant verbalizes understanding of the signs/symptoms and immediate care of hyper/hypoglycemia, proper foot care and importance of medication, aerobic/resistive exercise and nutrition plan for blood glucose control.;Long Term: Attainment of HbA1C < 7%.    Stress Yes    Intervention Offer individual and/or small group education and counseling on adjustment to heart disease, stress management and health-related lifestyle change. Teach and support self-help strategies.;Refer participants experiencing significant psychosocial distress to appropriate mental health specialists for further evaluation and treatment. When possible, include family members and significant others in education/counseling sessions.    Expected Outcomes Short Term: Participant demonstrates changes in health-related behavior, relaxation and other stress management skills, ability to obtain effective social support, and compliance with psychotropic medications if prescribed.;Long Term: Emotional wellbeing is indicated by absence of clinically significant psychosocial distress or social isolation.              Core Components/Risk Factors/Patient Goals Review:   Goals and Risk Factor Review     Row Name 03/13/23 1125             Core Components/Risk Factors/Patient Goals Review   Personal Goals Review Weight Management/Obesity;Diabetes;Improve shortness of breath with ADL's;Develop more efficient breathing techniques such as purse lipped breathing and diaphragmatic breathing and practicing self-pacing with activity.;Stress;Increase knowledge of respiratory medications and ability to use respiratory devices properly.       Review Jenna Jester has completed 4 PR classes so far. She has been working out on [employer] and walking the Track for exercise. She hasn't made any improvement yet on the NuStep. She has maintained her lap count while walking. She knows how to report her Rate of Perceived Exertion and her Dyspnea scale to staff. Jenna Jester is also working with out dietician to lose weight. So far, no changes in her weight yet Jenna Jester has only been to 4 classes thus far. Jenna Jester has correctly stated when to use her inhaler and has properly demonstrated it with our respiratory therapist. She can also initiate pursed lip breathing when she is short of breath. [name]'s oxygen saturation has continued to be within normal limits while on room air. We will continue to work on achieving [name]'s goals.       Expected Outcomes See Admission Goals                Core Components/Risk Factors/Patient Goals at Discharge (Final Review):   Goals and Risk Factor Review - 03/13/23 1125       Core Components/Risk Factors/Patient Goals Review   Personal Goals Review Weight Management/Obesity;Diabetes;Improve shortness of breath with ADL's;Develop more efficient breathing techniques such as purse lipped breathing and diaphragmatic breathing and practicing self-pacing with activity.;Stress;Increase knowledge of respiratory medications and ability to use respiratory devices properly.    Review Jenna Jester has  completed 4 PR classes so far. She has been working out on [employer] and walking the Track for exercise. She hasn't made any improvement yet on the NuStep. She has maintained her lap count while walking. She knows how to report her Rate  of Perceived Exertion and her Dyspnea scale to staff. Jenna Jester is also working with out dietician to lose weight. So far, no changes in her weight yet Jenna Jester has only been to 4 classes thus far. Jenna Jester has correctly stated when to use her inhaler and has properly demonstrated it with our respiratory therapist. She can also initiate pursed lip breathing when she is short of breath. [name]'s oxygen saturation has continued to be within normal limits while on room air. We will continue to work on achieving [name]'s goals.    Expected Outcomes See Admission Goals             ITP Comments:   Comments: Dr. Mechele Collin is Medical Director for Pulmonary Rehab at [facility].

## 2023-03-21 ENCOUNTER — Encounter (HOSPITAL_COMMUNITY): Payer: Medicare Other

## 2023-03-21 ENCOUNTER — Encounter (HOSPITAL_COMMUNITY)
Admission: RE | Admit: 2023-03-21 | Discharge: 2023-03-21 | Disposition: A | Discharge status: Home / Self Care | Payer: Medicare Other | Source: Ambulatory Visit | Attending: Pulmonary Disease | Admitting: Pulmonary Disease

## 2023-03-21 DIAGNOSIS — J4530 Mild persistent asthma, uncomplicated: Secondary | ICD-10-CM

## 2023-03-21 DIAGNOSIS — J9611 Chronic respiratory failure with hypoxia: Secondary | ICD-10-CM

## 2023-03-21 NOTE — Progress Notes (Signed)
Daily Session Note  Patient Details  Name: [name] MRN: [id number] Date of Birth: 1966/08/10 Referring Provider:   Doristine Devoid Pulmonary Rehab Walk Test from 02/20/2023 in [facility]  Referring Provider Dr. Vassie Loll       Encounter Date: 03/21/2023  Check In:  Session Check In - 03/21/23 1146       Check-In   Supervising physician immediately available to respond to emergencies [facility] MD immediately available    Physician(s) Carlos Levering, NP    Location [facility]    Staff Present Samantha Belarus, RD, Dutch Gray, RN, BSN;Randi Reeve BS, ACSM-CEP, Exercise Physiologist;Kaylee Earlene Plater, MS, ACSM-CEP, Exercise Physiologist;Casey Marcille Buffy, RN, BSN    Virtual Visit No    Medication changes reported     No    Fall or balance concerns reported    No    Tobacco Cessation No Change    Warm-up and Cool-down Performed as group-led instruction    Resistance Training Performed Yes    VAD Patient? No    PAD/SET Patient? No      Pain Assessment   Currently in Pain? No/denies    Pain Score 0-No pain    Multiple Pain Sites No             Capillary Blood Glucose: No results found for this or any previous visit (from the past 24 hour(s)).    Social History   Tobacco Use  Smoking Status Never  Smokeless Tobacco Never    Goals Met:  Independence with exercise equipment Exercise tolerated well Queuing for purse lip breathing No report of concerns or symptoms today Strength training completed today  Goals Unmet:  Not Applicable  Comments: Service time is from 1010 to 1145    Dr. Mechele Collin is Medical Director for Pulmonary Rehab at [facility].

## 2023-03-21 NOTE — Progress Notes (Addendum)
Home Exercise Prescription I have reviewed a Home Exercise Prescription with [name]. [name] is currently exercising at home. She walks 3 non-rehab days/wk for 10 min/day. I discussed exercise strategies with her. I encouraged [name] to consider grocery store walking so that she can walk for 30 min/day. She is currently walking up and down her driveway which has a steep incline. I mentioned grocery store walking since it would be level ground and temperature controlled. She agreed to my recommendations. [name] is wanted to gain some independency so that she can travel with her husband. I told [name] that getting out and walking around the grocery store would help increase her independency. [name] seems motivated to exercise and improve her functional capacity. The patient stated that their goals were to increase independency. We reviewed exercise guidelines, target heart rate during exercise, RPE Scale, weather conditions, endpoints for exercise, warmup and cool down. The patient is encouraged to come to me with any questions. I will continue to follow up with the patient to assist them with progression and safety. Spent 15 min with patient discussing home exercise plan and goals.  Joya San, MS, ACSM-CEP 03/21/2023 4:10 PM

## 2023-03-22 NOTE — Telephone Encounter (Signed)
Patient called to follow up on medication appeal.

## 2023-03-25 NOTE — Telephone Encounter (Addendum)
Hyoscyamine sulfate was denied under part D medicare exclusion drug with her insurance. Rep stated that there is no formulary medications for hyoscyamine Ref [id number] Jenna V. I told patient if she still wanted this medication she can use the good rx card but insurance denied our appeal. Please advise

## 2023-03-26 ENCOUNTER — Ambulatory Visit: Payer: Medicare Other | Admitting: Podiatry

## 2023-03-26 ENCOUNTER — Encounter (HOSPITAL_COMMUNITY): Payer: Medicare Other

## 2023-03-26 ENCOUNTER — Encounter (HOSPITAL_COMMUNITY)
Admission: RE | Admit: 2023-03-26 | Discharge: 2023-03-26 | Disposition: A | Discharge status: Home / Self Care | Payer: Medicare Other | Source: Ambulatory Visit | Attending: Pulmonary Disease | Admitting: Pulmonary Disease

## 2023-03-26 VITALS — Wt 298.5 lb

## 2023-03-26 DIAGNOSIS — J9611 Chronic respiratory failure with hypoxia: Secondary | ICD-10-CM | POA: Diagnosis not present

## 2023-03-26 DIAGNOSIS — J4530 Mild persistent asthma, uncomplicated: Secondary | ICD-10-CM

## 2023-03-26 NOTE — Telephone Encounter (Signed)
[name] is good at calling her insurance Please ask her if her insurance covers any form of hyoscyamine or dicyclomine RG

## 2023-03-26 NOTE — Progress Notes (Signed)
Daily Session Note  Patient Details  Name: [name] MRN: [id number] Date of Birth: 13-Aug-1966 Referring Provider:   Doristine Devoid Pulmonary Rehab Walk Test from 02/20/2023 in [facility] for Heart, Vascular, & Lung Health  Referring Provider Dr. Vassie Loll       Encounter Date: 03/26/2023  Check In:  Session Check In - 03/26/23 1232       Check-In   Physician(s) Jari Favre, PA    Location [facility]    Staff Present Essie Hart, RN, BSN;Randi Idelle Crouch BS, ACSM-CEP, Exercise Physiologist;Kaylee Earlene Plater, MS, ACSM-CEP, Exercise Physiologist;Casey Katrinka Blazing, RT    Virtual Visit No    Medication changes reported     No    Fall or balance concerns reported    No    Tobacco Cessation No Change    Warm-up and Cool-down Performed as group-led instruction    Resistance Training Performed Yes    VAD Patient? No    PAD/SET Patient? No      Pain Assessment   Currently in Pain? No/denies    Pain Score 0-No pain    Multiple Pain Sites No             Capillary Blood Glucose: No results found for this or any previous visit (from the past 24 hour(s)).   Exercise Prescription Changes - 03/26/23 1200       Response to Exercise   Blood Pressure (Admit) 98/60    Blood Pressure (Exercise) 113/63    Blood Pressure (Exit) 94/54    Heart Rate (Admit) 85 bpm    Heart Rate (Exercise) 106 bpm    Heart Rate (Exit) 79 bpm    Oxygen Saturation (Admit) 95 %    Oxygen Saturation (Exercise) 93 %    Oxygen Saturation (Exit) 93 %    Rating of Perceived Exertion (Exercise) 12    Perceived Dyspnea (Exercise) 2    Duration Continue with 30 min of aerobic exercise without signs/symptoms of physical distress.    Intensity THRR unchanged      Progression   Progression Continue to progress workloads to maintain intensity without signs/symptoms of physical distress.      Resistance Training   Training Prescription Yes    Weight red bands    Reps 10-15    Time  10 Minutes      NuStep   Level 1    SPM 60    Minutes 11    METs 2.2      Track   Laps 8    Minutes 15    METs 2.23             Social History   Tobacco Use  Smoking Status Never  Smokeless Tobacco Never    Goals Met:  Independence with exercise equipment Improved SOB with ADL's Changing diet to healthy choices, watching portion sizes Exercise tolerated well No report of concerns or symptoms today Strength training completed today  Goals Unmet:  Not Applicable  Comments: Pt's BS at beginning of class was 115, during exercise dropped to 77, pt ate protein bar with 17g of carbs. Rechecked and still dropping 74, glucose gel pack given. On exit, BS was 118.   Dr. Mechele Collin is Medical Director for Pulmonary Rehab at [facility].

## 2023-03-26 NOTE — Telephone Encounter (Signed)
Noted patient was advised

## 2023-03-28 ENCOUNTER — Encounter (HOSPITAL_COMMUNITY): Payer: Medicare Other

## 2023-03-28 ENCOUNTER — Encounter (HOSPITAL_COMMUNITY)
Admission: RE | Admit: 2023-03-28 | Discharge: 2023-03-28 | Disposition: A | Discharge status: Home / Self Care | Payer: Medicare Other | Source: Ambulatory Visit | Attending: Pulmonary Disease | Admitting: Pulmonary Disease

## 2023-03-28 DIAGNOSIS — J9611 Chronic respiratory failure with hypoxia: Secondary | ICD-10-CM | POA: Diagnosis not present

## 2023-03-28 DIAGNOSIS — J4530 Mild persistent asthma, uncomplicated: Secondary | ICD-10-CM

## 2023-03-28 NOTE — Progress Notes (Signed)
Daily Session Note  Patient Details  Name: [name] MRN: [id number] Date of Birth: 1966/08/08 Referring Provider:   Doristine Devoid Pulmonary Rehab Walk Test from 02/20/2023 in [facility] for Heart, Vascular, & Lung Health  Referring Provider Dr. Vassie Loll       Encounter Date: 03/28/2023  Check In:  Session Check In - 03/28/23 1209       Check-In   Supervising physician immediately available to respond to emergencies [facility] MD immediately available    Physician(s) Eligha Bridegroom, NP    Location [facility]    Staff Present Essie Hart, RN, BSN;Randi Idelle Crouch BS, ACSM-CEP, Exercise Physiologist;Cadden Elizondo Earlene Plater, MS, ACSM-CEP, Exercise Physiologist;Casey Katrinka Blazing, RT    Virtual Visit No    Medication changes reported     No    Fall or balance concerns reported    No    Tobacco Cessation No Change    Warm-up and Cool-down Performed as group-led instruction    Resistance Training Performed Yes    VAD Patient? No    PAD/SET Patient? No      Pain Assessment   Currently in Pain? No/denies    Pain Score 0-No pain    Multiple Pain Sites No             Capillary Blood Glucose: No results found for this or any previous visit (from the past 24 hour(s)).    Social History   Tobacco Use  Smoking Status Never  Smokeless Tobacco Never    Goals Met:  Proper associated with RPD/PD & O2 Sat Independence with exercise equipment Exercise tolerated well No report of concerns or symptoms today Strength training completed today  Goals Unmet:  Not Applicable  Comments: Service time is from 1012 to 1155.    Dr. Mechele Collin is Medical Director for Pulmonary Rehab at [facility].

## 2023-04-01 ENCOUNTER — Ambulatory Visit: Payer: Medicare Other | Admitting: Podiatry

## 2023-04-01 DIAGNOSIS — L84 Corns and callosities: Secondary | ICD-10-CM

## 2023-04-01 NOTE — Progress Notes (Signed)
Subjective: Chief Complaint  Patient presents with   Foot Ulcer    4 week follow up ulcer left foot.    57 year old female presents the office today for follow-up evaluation of a wound on her left foot.  She states "I think you are going to like it".  Denies any drainage or pus or swelling or redness.  No injuries.  No other concerns.   Objective: AAO x3, NAD DP/PT pulses palpable bilaterally, CRT less than 3 seconds  LEFT: Preulcerative hyperkeratotic lesion noted along the hallux plantarly.  Dried blood present in the calluses upon debridement the area is preulcerative but there is no skin breakdown.  There is no surrounding erythema, ascending cellulitis.  There is no fluctuation or crepitation.  There is no malodor. No pain with calf compression, swelling, warmth, erythema bilaterally   Assessment: Left hallux pre-ulcerative callus  Plan: -All treatment options discussed with the patient including all alternatives, risks, complications. -Debrided hyperkeratotic lesion on the hallux without any complications or bleeding.  Continue with offloading. -Monitor for any clinical signs or symptoms of infection and directed to call the office immediately should any occur or go to the ER.  Return in about 4 weeks (around 04/29/2023) for toe ulcer left big toe.  Vivi Barrack DPM

## 2023-04-02 ENCOUNTER — Encounter (HOSPITAL_COMMUNITY)
Admission: RE | Admit: 2023-04-02 | Discharge: 2023-04-02 | Disposition: A | Discharge status: Home / Self Care | Payer: Medicare Other | Source: Ambulatory Visit | Attending: Pulmonary Disease | Admitting: Pulmonary Disease

## 2023-04-02 ENCOUNTER — Encounter (HOSPITAL_COMMUNITY): Payer: Medicare Other

## 2023-04-02 DIAGNOSIS — J9611 Chronic respiratory failure with hypoxia: Secondary | ICD-10-CM

## 2023-04-02 NOTE — Progress Notes (Signed)
Daily Session Note  Patient Details  Name: [name] MRN: [id number] Date of Birth: 1966/08/27 Referring Provider:   Doristine Devoid Pulmonary Rehab Walk Test from 02/20/2023 in [facility] for Heart, Vascular, & Lung Health  Referring Provider Dr. Vassie Loll       Encounter Date: 04/02/2023  Check In:  Session Check In - 04/02/23 1138       Check-In   Supervising physician immediately available to respond to emergencies [facility] MD immediately available    Physician(s) Eligha Bridegroom, NP    [facility]    Staff Present Samantha Belarus, RD, Dutch Gray, RN, BSN;Randi Reeve BS, ACSM-CEP, Exercise Physiologist;Kaylee Earlene Plater, MS, ACSM-CEP, Exercise Physiologist;Sagal Gayton Katrinka Blazing, RT    Virtual Visit No    Medication changes reported     No    Fall or balance concerns reported    No    Tobacco Cessation No Change    Warm-up and Cool-down Performed as group-led instruction    Resistance Training Performed Yes    VAD Patient? No    PAD/SET Patient? No      Pain Assessment   Currently in Pain? No/denies    Multiple Pain Sites No             Capillary Blood Glucose: No results found for this or any previous visit (from the past 24 hour(s)).    Social History   Tobacco Use  Smoking Status Never  Smokeless Tobacco Never    Goals Met:  Proper associated with RPD/PD & O2 Sat Independence with exercise equipment Exercise tolerated well No report of concerns or symptoms today Strength training completed today  Goals Unmet:  Not Applicable  Comments: Service time is from 1009 to 1152.    Dr. Mechele Collin is Medical Director for Pulmonary Rehab at [facility].

## 2023-04-04 ENCOUNTER — Encounter (HOSPITAL_COMMUNITY)
Admission: RE | Admit: 2023-04-04 | Discharge: 2023-04-04 | Disposition: A | Discharge status: Home / Self Care | Payer: Medicare Other | Source: Ambulatory Visit | Attending: Pulmonary Disease | Admitting: Pulmonary Disease

## 2023-04-04 ENCOUNTER — Encounter (HOSPITAL_COMMUNITY): Payer: Medicare Other

## 2023-04-04 VITALS — Wt 299.6 lb

## 2023-04-04 DIAGNOSIS — J9611 Chronic respiratory failure with hypoxia: Secondary | ICD-10-CM

## 2023-04-04 DIAGNOSIS — J4530 Mild persistent asthma, uncomplicated: Secondary | ICD-10-CM

## 2023-04-04 NOTE — Progress Notes (Signed)
Daily Session Note  Patient Details  Name: [name] MRN: [id number] Date of Birth: [birth year] Referring Provider:   Doristine Devoid Pulmonary Rehab Walk Test from 02/20/2023 in [facility] for Heart, Vascular, & Lung Health  Referring Provider Dr. Vassie Loll       Encounter Date: 04/04/2023  Check In:  Session Check In - 04/04/23 1210       Check-In   Supervising physician immediately available to respond to emergencies [facility] MD immediately available    Physician(s) Bernadene Person, NP    Location [facility]    Staff Present Samantha Belarus, RD, Dutch Gray, RN, BSN;Randi Reeve BS, ACSM-CEP, Exercise Physiologist;Kaylee Earlene Plater, MS, ACSM-CEP, Exercise Physiologist;Mazzy Santarelli Katrinka Blazing, RT    Virtual Visit No    Medication changes reported     No    Fall or balance concerns reported    No    Tobacco Cessation No Change    Warm-up and Cool-down Performed as group-led instruction    Resistance Training Performed Yes    VAD Patient? No    PAD/SET Patient? No      Pain Assessment   Currently in Pain? No/denies    Pain Score 0-No pain    Multiple Pain Sites No             Capillary Blood Glucose: No results found for this or any previous visit (from the past 24 hour(s)).    Social History   Tobacco Use  Smoking Status Never  Smokeless Tobacco Never    Goals Met:  Proper associated with RPD/PD & O2 Sat Independence with exercise equipment Exercise tolerated well No report of concerns or symptoms today Strength training completed today  Goals Unmet:  Not Applicable  Comments: Service time is from 1016 to 1156.    Dr. Mechele Collin is Medical Director for Pulmonary Rehab at [facility].

## 2023-04-09 ENCOUNTER — Encounter (HOSPITAL_COMMUNITY): Payer: Medicare Other

## 2023-04-09 ENCOUNTER — Telehealth (HOSPITAL_COMMUNITY): Payer: Self-pay | Admitting: *Deleted

## 2023-04-09 NOTE — Telephone Encounter (Signed)
Patient left message on department voicemail this morning. She will be unable to attend pulmonary rehab today. Appointment cancelled, message forwarded to pulmonary rehab staff.

## 2023-04-10 NOTE — Telephone Encounter (Signed)
Appeal letter faxed to [phone]

## 2023-04-11 ENCOUNTER — Telehealth (HOSPITAL_COMMUNITY): Payer: Self-pay

## 2023-04-11 ENCOUNTER — Encounter (HOSPITAL_COMMUNITY): Payer: Medicare Other

## 2023-04-11 NOTE — Telephone Encounter (Signed)
Pt left message with the department's administrative assistant stating that she was sick. Pt plans to return 5/7.

## 2023-04-12 ENCOUNTER — Telehealth (HOSPITAL_COMMUNITY): Payer: Self-pay

## 2023-04-12 NOTE — Telephone Encounter (Signed)
Called pt to check on her since she missed 2 PR classes. She stated she has had a cold and will be back to class on Tuesday.

## 2023-04-16 ENCOUNTER — Encounter (HOSPITAL_COMMUNITY): Payer: Medicare Other

## 2023-04-16 ENCOUNTER — Encounter (HOSPITAL_COMMUNITY)
Admission: RE | Admit: 2023-04-16 | Discharge: 2023-04-16 | Disposition: A | Discharge status: Home / Self Care | Payer: Medicare Other | Source: Ambulatory Visit | Attending: Pulmonary Disease | Admitting: Pulmonary Disease

## 2023-04-16 DIAGNOSIS — J4530 Mild persistent asthma, uncomplicated: Secondary | ICD-10-CM | POA: Diagnosis present

## 2023-04-16 DIAGNOSIS — J9611 Chronic respiratory failure with hypoxia: Secondary | ICD-10-CM

## 2023-04-16 NOTE — Progress Notes (Signed)
Daily Session Note  Patient Details  Name: SHERISSE SUDLER MRN: [id number] Date of Birth: [birth year] Referring Provider:   Doristine Devoid Pulmonary Rehab Walk Test from 02/20/2023 in [facility] for Heart, Vascular, & Lung Health  Referring Provider Dr. Vassie Loll       Encounter Date: 04/16/2023  Check In:  Session Check In - 04/16/23 1143       Check-In   Supervising physician immediately available to respond to emergencies [facility] immediately available    Physician(s) Carlos Levering, NP    [facility]    Staff Present Essie Hart, RN, BSN;Randi Idelle Crouch BS, ACSM-CEP, Exercise Physiologist;Kaylee Earlene Plater, MS, ACSM-CEP, Exercise Physiologist;Mendy Lapinsky Marcille Buffy, RN, BSN    Virtual Visit No    Medication changes reported     No    Fall or balance concerns reported    No    Tobacco Cessation No Change    Warm-up and Cool-down Performed as group-led instruction    Resistance Training Performed Yes    VAD Patient? No    PAD/SET Patient? No      Pain Assessment   Currently in Pain? No/denies    Pain Score 0-No pain    Multiple Pain Sites No             Capillary Blood Glucose: No results found for this or any previous visit (from the past 24 hour(s)).    Social History   Tobacco Use  Smoking Status Never  Smokeless Tobacco Never    Goals Met:  Proper associated with RPD/PD & O2 Sat Independence with exercise equipment Exercise tolerated well No report of concerns or symptoms today Strength training completed today  Goals Unmet:  Not Applicable  Comments: Service time is from 1033 to 1148.    Dr. Mechele Collin is Medical Director for Pulmonary Rehab at [facility].

## 2023-04-17 ENCOUNTER — Other Ambulatory Visit: Payer: Self-pay | Admitting: Gastroenterology

## 2023-04-18 ENCOUNTER — Encounter (HOSPITAL_COMMUNITY): Payer: Medicare Other

## 2023-04-18 ENCOUNTER — Encounter (HOSPITAL_COMMUNITY)
Admission: RE | Admit: 2023-04-18 | Discharge: 2023-04-18 | Disposition: A | Discharge status: Home / Self Care | Payer: Medicare Other | Source: Ambulatory Visit | Attending: Pulmonary Disease | Admitting: Pulmonary Disease

## 2023-04-18 DIAGNOSIS — J9611 Chronic respiratory failure with hypoxia: Secondary | ICD-10-CM | POA: Diagnosis not present

## 2023-04-18 DIAGNOSIS — J4530 Mild persistent asthma, uncomplicated: Secondary | ICD-10-CM

## 2023-04-18 NOTE — Progress Notes (Signed)
Daily Session Note  Patient Details  Name: [name] MRN: [id number] Date of Birth: 08-21-1966 Referring Provider:   Doristine Devoid Pulmonary Rehab Walk Test from 02/20/2023 in [facility] for Heart, Vascular, & Lung Health  Referring Provider Dr. Vassie Loll       Encounter Date: 04/18/2023  Check In:  Session Check In - 04/18/23 1122       Check-In   Supervising physician immediately available to respond to emergencies [facility] MD immediately available    Physician(s) Jari Favre, PA    Location [facility]    Staff Present Essie Hart, RN, BSN;Randi Idelle Crouch BS, ACSM-CEP, Exercise Physiologist;Kaylee Earlene Plater, MS, ACSM-CEP, Exercise Physiologist;Casey Heloise Ochoa, MS, ACSM-CEP, CCRP, Exercise Physiologist    Virtual Visit No    Medication changes reported     No    Fall or balance concerns reported    No    Tobacco Cessation No Change    Warm-up and Cool-down Performed as group-led instruction    Resistance Training Performed Yes    VAD Patient? No    PAD/SET Patient? No      Pain Assessment   Currently in Pain? No/denies    Pain Score 0-No pain    Multiple Pain Sites No             Capillary Blood Glucose: No results found for this or any previous visit (from the past 24 hour(s)).    Social History   Tobacco Use  Smoking Status Never  Smokeless Tobacco Never    Goals Met:  Proper associated with RPD/PD & O2 Sat Improved SOB with ADL's Exercise tolerated well No report of concerns or symptoms today Strength training completed today  Goals Unmet:  Not Applicable  Comments: Service time is from 1010 to 1142    Dr. Mechele Collin is Medical Director for Pulmonary Rehab at [facility].

## 2023-04-23 ENCOUNTER — Encounter (HOSPITAL_COMMUNITY)
Admission: RE | Admit: 2023-04-23 | Discharge: 2023-04-23 | Disposition: A | Discharge status: Home / Self Care | Payer: Medicare Other | Source: Ambulatory Visit | Attending: Pulmonary Disease | Admitting: Pulmonary Disease

## 2023-04-23 ENCOUNTER — Encounter (HOSPITAL_COMMUNITY): Payer: Medicare Other

## 2023-04-23 DIAGNOSIS — J9611 Chronic respiratory failure with hypoxia: Secondary | ICD-10-CM

## 2023-04-23 DIAGNOSIS — J4530 Mild persistent asthma, uncomplicated: Secondary | ICD-10-CM

## 2023-04-24 NOTE — Progress Notes (Signed)
Pulmonary Individual Treatment Plan  Patient Details  Name: [name] MRN: [id number] Date of Birth: 02/13/1966 Referring Provider:   April Manson Pulmonary Rehab Walk Test from 02/20/2023 in [facility] for Heart, Vascular, & [place]  Referring Provider Dr. Elsworth Soho       Initial Encounter Date:  Flowsheet Row Pulmonary Rehab Walk Test from 02/20/2023 in [facility] for Heart, Vascular, & Lung [facility]  Date 02/20/23       Visit Diagnosis: Chronic respiratory failure with hypoxia ([place])  Mild persistent asthma without complication  Patient's Home Medications on Admission:   Current Outpatient Medications:    acetaminophen (TYLENOL) 500 MG tablet, Take 1,000 mg by mouth every 6 (six) hours as needed for moderate pain or headache. , Disp: , Rfl:    albuterol (VENTOLIN HFA) 108 (90 Base) MCG/ACT inhaler, inhale 2 puffs DAILY, Disp: 6.7 g, Rfl: 11   ALPRAZolam (XANAX) 0.5 MG tablet, Take 1 tablet by mouth every 6 (six) hours as needed., Disp: , Rfl:    ammonium lactate (LAC-HYDRIN) 12 % lotion, APPLY TO ARMS   HANDS 2 TIMES A DAY ESPECIALLY AFTER BATH, Disp: , Rfl:    APPLE CIDER VINEGAR PO, Take 1 each by mouth daily. One gummy once a day, Disp: , Rfl:    ARTIFICIAL TEARS 0.2-0.2-1 % SOLN, See admin instructions., Disp: , Rfl:    ASPIRIN 81 PO, 81 tablets every day by oral route., Disp: , Rfl:    atorvastatin (LIPITOR) 20 MG tablet, Take 1 tablet by mouth daily., Disp: , Rfl:    Azelastine HCl 137 MCG/SPRAY SOLN, daily., Disp: , Rfl:    b complex vitamins tablet, Take 1 tablet by mouth daily., Disp: , Rfl:    Bayer Microlet Lancets lancets, 4 each by Other route 4 (four) times daily., Disp: , Rfl:    Bepotastine Besilate 1.5 % SOLN, 1 drop 2 (two) times daily., Disp: , Rfl:    bisoprolol (ZEBETA) 5 MG tablet, Take 1 tablet (5 mg total) by mouth daily., Disp: 90 tablet, Rfl: 3   Black Pepper-Turmeric (TURMERIC CURCUMIN) 04-999 MG  CAPS, See admin instructions. At bedtime, Disp: , Rfl:    cetirizine (ZYRTEC) 10 MG tablet, 1 tablet as needed, Disp: , Rfl:    clobetasol cream (TEMOVATE) [id number] %, Apply 1 application topically 2 (two) times daily as needed. , Disp: , Rfl:    cloNIDine (CATAPRES - DOSED IN MG/24 HR) 0.1 mg/24hr patch, Place 1 patch (0.1 mg total) onto the skin once a week. Start in 2 weeks, Disp: 4 patch, Rfl: 12   Continuous Blood Gluc Sensor (FREESTYLE LIBRE 3 SENSOR) MISC, USE AS DIRECTED TO CHECK BLOOD SUGARS, Disp: , Rfl:    cyclobenzaprine (FLEXERIL) 10 MG tablet, Take 10 mg by mouth See admin instructions. Take 10 mg by mouth at bedtime. Take 10 mg by mouth every 8 hours as needed for muscle spasms, Disp: , Rfl:    D3-50 50000 units capsule, Take 50,000 Units by mouth every Saturday., Disp: , Rfl: 4   dapagliflozin propanediol (FARXIGA) 5 MG TABS tablet, 1 tablet, Disp: , Rfl:    diclofenac Sodium (VOLTAREN) 1 % GEL, APPLY 2 GRAMS TO AFFECTED AREA OF FOOT 2-4 TIMES DAILY, Disp: 100 g, Rfl: 0   dicyclomine (BENTYL) 10 MG capsule, Take 1 capsule (10 mg total) by mouth every 8 (eight) hours as needed for spasms., Disp: 120 capsule, Rfl: 11   diphenhydrAMINE (BENADRYL) 50 MG/ML injection, ,  Disp: , Rfl:    DULoxetine (CYMBALTA) 60 MG capsule, Take 60 mg by mouth daily., Disp: , Rfl:    fluticasone (FLONASE) 50 MCG/ACT nasal spray, Place 1 spray into both nostrils at bedtime., Disp: , Rfl:    fluticasone furoate-vilanterol (BREO ELLIPTA) 200-25 MCG/ACT AEPB, Inhale 1 puff into the lungs daily., Disp: 60 each, Rfl: 2   furosemide (LASIX) 80 MG tablet, Take 80 mg by mouth daily., Disp: , Rfl:    gabapentin (NEURONTIN) 300 MG capsule, Take 2 capsules (600 mg total) by mouth 3 (three) times daily., Disp: 540 capsule, Rfl: 1   GAMUNEX-C 20 GM/200ML SOLN, , Disp: , Rfl:    glucose blood (CONTOUR NEXT TEST) test strip, 4 each by Other route 4 (four) times daily., Disp: , Rfl:    HYDROcodone-acetaminophen (NORCO) 7.5-325  MG tablet, Take 1-2 tablets by mouth every 6 (six) hours as needed for moderate pain (for pain.)., Disp: 40 tablet, Rfl: 0   hyoscyamine (LEVSIN SL) 0.125 MG SL tablet, PLACE 2 TABLETS UNDER TONGUE EVERY 4 HOURS AS NEEDED WITH MEALS., Disp: 120 tablet, Rfl: 11   Insulin Human (INSULIN PUMP) SOLN, Inject into the skin., Disp: , Rfl:    insulin regular human CONCENTRATED (HUMULIN R) 500 UNIT/ML injection, Inject 2 Units into the skin See admin instructions. 2.0 units /hour 0500-2359, 1.95units 0000-0459 with carb ratio 1:50 for meal time bolus, Disp: , Rfl:    ketoconazole (NIZORAL) 2 % cream, Apply 1 application topically 2 (two) times daily as needed (for toes). , Disp: , Rfl:    levothyroxine (SYNTHROID) 137 MCG tablet, Take 2 tablets by mouth daily., Disp: , Rfl:    lisinopril (ZESTRIL) 10 MG tablet, Take 1 tablet (10 mg total) by mouth daily., Disp: 90 tablet, Rfl: 3   metFORMIN (GLUCOPHAGE) 1000 MG tablet, 1 tablet with a meal, Disp: , Rfl:    metroNIDAZOLE (METROGEL) 1 % gel, Apply topically 2 (two) times daily., Disp: , Rfl:    Multiple Vitamin (MULTIVITAMIN) tablet, Take 1 tablet by mouth at bedtime. , Disp: , Rfl:    mupirocin ointment (BACTROBAN) 2 %, APPLY  OINTMENT TOPICALLY TO AFFECTED AREA TWICE DAILY, Disp: 22 g, Rfl: 0   NURTEC 75 MG TBDP, Take 1 tablet by mouth daily., Disp: , Rfl:    ondansetron (ZOFRAN-ODT) 8 MG disintegrating tablet, Take 1 tablet (8 mg total) by mouth every 8 (eight) hours as needed., Disp: 30 tablet, Rfl: 4   OZEMPIC, 1 MG/DOSE, 4 MG/3ML SOPN, Inject into the skin., Disp: , Rfl:    pantoprazole (PROTONIX) 40 MG tablet, Take 1 tablet (40 mg total) by mouth 2 (two) times daily., Disp: 180 tablet, Rfl: 4   PATADAY 0.1 % ophthalmic solution, 1 drop into affected eye Ophthalmic Twice a day for 30 days, Disp: , Rfl:    polyethylene glycol (MIRALAX / GLYCOLAX) 17 g packet, Take 17 g by mouth daily., Disp: 90 packet, Rfl: 3   PROCTOSOL HC 2.5 % rectal cream, Apply 1  application topically 2 (two) times daily as needed for hemorrhoids. , Disp: , Rfl: 2   sodium chloride 0.9 % infusion, Inject into the vein., Disp: , Rfl:    Spacer/Aero-Holding Chambers (AEROCHAMBER MV) inhaler, Use as instructed, Disp: 1 each, Rfl: 0   Urea (UREA NAIL) 45 % GEL, Apply 1 Application topically daily., Disp: 28 mL, Rfl: 1   VRAYLAR 1.5 MG capsule, Take 1.5 mg by mouth daily., Disp: , Rfl:   Past Medical History: Past  Medical History:  Diagnosis Date   Anginal pain (HCC)    chest pain  heart cath done   Anxiety    Cancer [facility])    thyroid cancer   Chronic bronchitis (HCC)    "get it q yr"   Chronic inflammatory demyelinating polyneuropathy (HCC)    Chronic kidney disease    Proteinuria   Dyspnea    sob with exertion   Early cataract    right   Fibromyalgia    GERD (gastroesophageal reflux disease)    Heart murmur    Hepatic adenoma    High cholesterol    History of gout    History of hiatal hernia    History of kidney stones    Hypertension    Hypothyroidism    IBS (irritable bowel syndrome)    Iron deficiency anemia    "comes and goes"   Migraine    "@ least once/month" (02/18/2015)   Obesity    PONV (postoperative nausea and vomiting)    patch and anti nausea meds work well   Proteinuria    Spinal headache    Syncope    Tachycardia    Type II diabetes mellitus (HCC)    pt has an insulin pump   Wears glasses     Tobacco Use: Social History   Tobacco Use  Smoking Status Never  Smokeless Tobacco Never    Labs: Review Flowsheet  More data may exist      Latest Ref Rng & Units 07/01/2009 02/19/2015 03/29/2016 09/26/2017 04/03/2018  Labs for ITP Cardiac and Pulmonary Rehab  Cholestrol 0 - 200 mg/dL 409        ATP III CLASSIFICATION:  <200     mg/dL   Desirable  811-914  mg/dL   Borderline High  >=782    mg/dL   High         - - - -  LDL (calc) 0 - 99 mg/dL 85        Total Cholesterol/HDL:CHD Risk Coronary Heart Disease Risk Table                      Men   Women  1/2 Average Risk   3.4   3.3  Average Risk       5.0   4.4  2 X Average Risk   9.6   7.1  3 X Average Risk  23.4   11.0        Use the calculated Patient Ratio above and the CHD Risk Table to determine the patient's CHD Risk.        ATP III CLASSIFICATION (LDL):  <100     mg/dL   Optimal  956-213  mg/dL   Near or Above                    Optimal  130-159  mg/dL   Borderline  086-578  mg/dL   High  >469     mg/dL   Very High  - - - -  HDL-C >39 mg/dL 56  - - - -  Trlycerides <150 mg/dL 629  - - - -  Hemoglobin A1c 4.8 - 5.6 % - 7.7  6.8  7.7  6.8   TCO2 0 - 100 mmol/L 21  - - - -    Capillary Blood Glucose: Lab Results  Component Value Date   GLUCAP 99 03/07/2023   GLUCAP 89 02/26/2023   GLUCAP 108 (H) 02/26/2023  GLUCAP 101 (H) 02/26/2023   GLUCAP 134 (H) 09/22/2018    POCT Glucose     Row Name 02/20/23 1056 03/26/23 1240           POCT Blood Glucose   Pre-Exercise 72 mg/dL  Snack provided to patient 115 mg/dL      Post-Exercise 96 mg/dL 098 mg/dL      Pre-Exercise #2 89 mg/dL 79 mg/dL      Post-Exercise #2 -- 77 mg/dL  Pt stopped exercising               Pulmonary Assessment Scores:  Pulmonary Assessment Scores     Row Name 02/20/23 1143 02/20/23 1220       ADL UCSD   SOB Score total 61 --      CAT Score   CAT Score 28 --      mMRC Score   mMRC Score -- 2            UCSD: Self-administered rating of dyspnea associated with activities of daily living (ADLs) 6-point scale (0 = "not at all" to 5 = "maximal or unable to do because of breathlessness")  Scoring Scores range from 0 to 120.  Minimally important difference is 5 units  CAT: CAT can identify the health impairment of COPD patients and is better correlated with disease progression.  CAT has a scoring range of zero to 40. The CAT score is classified into four groups of low (less than 10), medium (10 - 20), high (21-30) and very high (31-40) based on the impact  level of disease on health status. A CAT score over 10 suggests significant symptoms.  A worsening CAT score could be explained by an exacerbation, poor medication adherence, poor inhaler technique, or progression of COPD or comorbid conditions.  CAT MCID is 2 points  mMRC: mMRC (Modified Medical Research Council) Dyspnea Scale is used to assess the degree of baseline functional disability in patients of respiratory disease due to dyspnea. No minimal important difference is established. A decrease in score of 1 point or greater is considered a positive change.   Pulmonary Function Assessment:  Pulmonary Function Assessment - 02/20/23 1315       Breath   Bilateral Breath Sounds Clear   RLL diminished   Shortness of Breath Yes;Fear of Shortness of Breath;Limiting activity             Exercise Target Goals: Exercise Program Goal: Individual exercise prescription set using results from initial 6 min walk test and THRR while considering  patient's activity barriers and safety.   Exercise Prescription Goal: Initial exercise prescription builds to 30-45 minutes a day of aerobic activity, 2-3 days per week.  Home exercise guidelines will be given to patient during program as part of exercise prescription that the participant will acknowledge.  Activity Barriers & Risk Stratification:  Activity Barriers & Cardiac Risk Stratification - 02/20/23 1057       Activity Barriers & Cardiac Risk Stratification   Activity Barriers Deconditioning;Muscular Weakness;Shortness of Breath;Assistive Device;Joint Problems;Back Problems   No squats, kneeling, weight restrictions 10lbs or less            6 Minute Walk:  6 Minute Walk     Row Name 02/20/23 1212         6 Minute Walk   Phase Initial     Distance 812 feet     Walk Time 6 minutes     # of Rest Breaks 1  2:43-3:40  MPH 1.54     METS 1.92     RPE 17     Perceived Dyspnea  2     VO2 Peak 6.71     Resting HR 82 bpm      Resting BP 102/60     Resting Oxygen Saturation  96 %     Exercise Oxygen Saturation  during 6 min walk 93 %     Max Ex. HR 132 bpm     Max Ex. BP 122/64     2 Minute Post BP 112/68       Interval HR   1 Minute HR 111     2 Minute HR 124     3 Minute HR 126     4 Minute HR 115     5 Minute HR 119     6 Minute HR 132     2 Minute Post HR 84     Interval Heart Rate? Yes       Interval Oxygen   Interval Oxygen? Yes     Baseline Oxygen Saturation % 96 %     1 Minute Oxygen Saturation % 98 %     1 Minute Liters of Oxygen 0 L     2 Minute Oxygen Saturation % 94 %     2 Minute Liters of Oxygen 0 L     3 Minute Oxygen Saturation % 96 %     3 Minute Liters of Oxygen 0 L     4 Minute Oxygen Saturation % 95 %     4 Minute Liters of Oxygen 0 L     5 Minute Oxygen Saturation % 93 %     5 Minute Liters of Oxygen 0 L     6 Minute Oxygen Saturation % 94 %     6 Minute Liters of Oxygen 0 L     2 Minute Post Oxygen Saturation % 96 %     2 Minute Post Liters of Oxygen 0 L              Oxygen Initial Assessment:  Oxygen Initial Assessment - 02/20/23 1219       Initial 6 min Walk   Oxygen Used None      Program Oxygen Prescription   Program Oxygen Prescription None      Intervention   Short Term Goals To learn and understand importance of maintaining oxygen saturations>88%;To learn and demonstrate proper pursed lip breathing techniques or other breathing techniques. ;To learn and demonstrate proper use of respiratory medications    Long  Term Goals Exhibits proper breathing techniques, such as pursed lip breathing or other method taught during program session;Compliance with respiratory medication;Demonstrates proper use of MDI's;Maintenance of O2 saturations>88%             Oxygen Re-Evaluation:  Oxygen Re-Evaluation     Row Name 03/11/23 0849 04/17/23 1201           Program Oxygen Prescription   Program Oxygen Prescription None None        Home Oxygen   Home  Oxygen Device None None      Sleep Oxygen Prescription BiPAP;None BiPAP      Home Exercise Oxygen Prescription None None      Home Resting Oxygen Prescription None None        Goals/Expected Outcomes   Short Term Goals To learn and understand importance of maintaining oxygen saturations>88%;To learn and demonstrate proper pursed lip breathing techniques or other  breathing techniques. ;To learn and demonstrate proper use of respiratory medications To learn and understand importance of maintaining oxygen saturations>88%;To learn and demonstrate proper pursed lip breathing techniques or other breathing techniques. ;To learn and demonstrate proper use of respiratory medications      Long  Term Goals Exhibits proper breathing techniques, such as pursed lip breathing or other method taught during program session;Compliance with respiratory medication;Demonstrates proper use of MDI's;Maintenance of O2 saturations>88% Exhibits proper breathing techniques, such as pursed lip breathing or other method taught during program session;Compliance with respiratory medication;Demonstrates proper use of MDI's;Maintenance of O2 saturations>88%      Goals/Expected Outcomes Compliance and understanding of oxygen saturation monitoring and breathing techniques to decrease shortness of breath. Compliance and understanding of oxygen saturation monitoring and breathing techniques to decrease shortness of breath.               Oxygen Discharge (Final Oxygen Re-Evaluation):  Oxygen Re-Evaluation - 04/17/23 1201       Program Oxygen Prescription   Program Oxygen Prescription None      Home Oxygen   Home Oxygen Device None    Sleep Oxygen Prescription BiPAP    Home Exercise Oxygen Prescription None    Home Resting Oxygen Prescription None      Goals/Expected Outcomes   Short Term Goals To learn and understand importance of maintaining oxygen saturations>88%;To learn and demonstrate proper pursed lip breathing  techniques or other breathing techniques. ;To learn and demonstrate proper use of respiratory medications    Long  Term Goals Exhibits proper breathing techniques, such as pursed lip breathing or other method taught during program session;Compliance with respiratory medication;Demonstrates proper use of MDI's;Maintenance of O2 saturations>88%    Goals/Expected Outcomes Compliance and understanding of oxygen saturation monitoring and breathing techniques to decrease shortness of breath.             Initial Exercise Prescription:  Initial Exercise Prescription - 02/20/23 1300       Date of Initial Exercise RX and Referring Provider   Date 02/20/23    Referring Provider Dr. Vassie Loll    Expected Discharge Date 05/16/23      Recumbant Elliptical   Level 1    Minutes 15      Track   Laps 8    Minutes 15      Prescription Details   Frequency (times per week) 2    Duration Progress to 30 minutes of continuous aerobic without signs/symptoms of physical distress      Intensity   THRR 40-80% of Max Heartrate 66-132    Ratings of Perceived Exertion 11-13    Perceived Dyspnea 0-4      Progression   Progression Continue to progress workloads to maintain intensity without signs/symptoms of physical distress.      Resistance Training   Training Prescription Yes    Weight red bands    Reps 10-15             Perform Capillary Blood Glucose checks as needed.  Exercise Prescription Changes:   Exercise Prescription Changes     Row Name 02/26/23 1200 03/12/23 1500 03/21/23 1600 03/26/23 1200 04/04/23 1227     Response to Exercise   Blood Pressure (Admit) 122/70 92/62 -- 98/60 122/64   Blood Pressure (Exercise) 132/70 108/60 -- 113/63 --   Blood Pressure (Exit) 102/56 102/62 -- 94/54 114/74   Heart Rate (Admit) 84 bpm 71 bpm -- 85 bpm 98 bpm   Heart Rate (Exercise) 105 bpm  110 bpm -- 106 bpm 116 bpm   Heart Rate (Exit) 64 bpm 80 bpm -- 79 bpm 100 bpm   Oxygen Saturation (Admit)  93 % 96 % -- 95 % 92 %   Oxygen Saturation (Exercise) 94 % 95 % -- 93 % 94 %   Oxygen Saturation (Exit) 95 % 97 % -- 93 % 97 %   Rating of Perceived Exertion (Exercise) 13 15 -- 12 12   Perceived Dyspnea (Exercise) 1 3 -- 2 1   Duration Progress to 10 minutes continuous walking  at current work load and total walking time to 30-45 min Progress to 30 minutes of  aerobic without signs/symptoms of physical distress -- Continue with 30 min of aerobic exercise without signs/symptoms of physical distress. Continue with 30 min of aerobic exercise without signs/symptoms of physical distress.   Intensity THRR unchanged THRR unchanged -- THRR unchanged THRR unchanged     Progression   Progression Continue to progress workloads to maintain intensity without signs/symptoms of physical distress. Continue to progress workloads to maintain intensity without signs/symptoms of physical distress. -- Continue to progress workloads to maintain intensity without signs/symptoms of physical distress. Continue to progress workloads to maintain intensity without signs/symptoms of physical distress.     Resistance Training   Training Prescription Yes Yes -- Yes Yes   Weight red bands red bands -- red bands red bands   Reps 10-15 10-15 -- 10-15 10-15   Time 10 Minutes 10 Minutes -- 10 Minutes 10 Minutes     NuStep   Level 1 1 -- 1 2   SPM 60 60 -- 60 60   Minutes 10  waited forCBG to increase 15 -- 11 15   METs 2 2.2 -- 2.2 2.3     Track   Laps -- 8 -- 8 7   Minutes -- 15 -- 15 15   METs -- 2.23 -- 2.23 2.08     Home Exercise Plan   Plans to continue exercise at -- -- Home (comment) -- --   Frequency -- -- Add 1 additional day to program exercise sessions. -- --   Initial Home Exercises Provided -- -- 03/21/23 -- --            Exercise Comments:   Exercise Comments     Row Name 02/28/23 1209 03/21/23 1602         Exercise Comments Pt completed second day of exercise. She exercised for 15 min on  the track and Nust[place]. Michele averaged 1.77 METs on the track and 1.9 METs at level 1 on the Nustep. She performs the warmup and cooldown standing/seated dependent on her shortness of breath. Discussed METs and how to increase METs. Completed home exercise plan. Elon Jester is currently exercising at home. She walks 3 non-rehab days/wk for 10 min/day. I discussed exercise strategies with her. I encouraged Elon Jester to consider grocery store walking so that she can walk for 30 min/day. She is currently walking up and down her driveway which has a steep incline. I mentioned grocery store walking since it would be level ground and temperature controlled. She agreed to my recommendations. Elon Jester is wanted to gain some independency so that she can travel with her husband. I told Elon Jester that getting out and walking around the grocery store would help increase her independency. Elon Jester seems motivated to exercise and improve her functional capacity.               Exercise Goals and Review:  Exercise Goals     Row Name 02/20/23 1111 03/11/23 0843 04/17/23 1158         Exercise Goals   Increase Physical Activity Yes Yes Yes     Intervention Provide advice, education, support and counseling about physical activity/exercise needs.;Develop an individualized exercise prescription for aerobic and resistive training based on initial evaluation findings, risk stratification, comorbidities and participant's personal goals. Provide advice, education, support and counseling about physical activity/exercise needs.;Develop an individualized exercise prescription for aerobic and resistive training based on initial evaluation findings, risk stratification, comorbidities and participant's personal goals. Provide advice, education, support and counseling about physical activity/exercise needs.;Develop an individualized exercise prescription for aerobic and resistive training based on initial evaluation findings, risk  stratification, comorbidities and participant's personal goals.     Expected Outcomes Short Term: Attend rehab on a regular basis to increase amount of physical activity.;Long Term: Exercising regularly at least 3-5 days a week.;Long Term: Add in home exercise to make exercise part of routine and to increase amount of physical activity. Short Term: Attend rehab on a regular basis to increase amount of physical activity.;Long Term: Exercising regularly at least 3-5 days a week.;Long Term: Add in home exercise to make exercise part of routine and to increase amount of physical activity. Short Term: Attend rehab on a regular basis to increase amount of physical activity.;Long Term: Exercising regularly at least 3-5 days a week.;Long Term: Add in home exercise to make exercise part of routine and to increase amount of physical activity.     Increase Strength and Stamina Yes Yes Yes     Intervention Provide advice, education, support and counseling about physical activity/exercise needs.;Develop an individualized exercise prescription for aerobic and resistive training based on initial evaluation findings, risk stratification, comorbidities and participant's personal goals. Provide advice, education, support and counseling about physical activity/exercise needs.;Develop an individualized exercise prescription for aerobic and resistive training based on initial evaluation findings, risk stratification, comorbidities and participant's personal goals. Provide advice, education, support and counseling about physical activity/exercise needs.;Develop an individualized exercise prescription for aerobic and resistive training based on initial evaluation findings, risk stratification, comorbidities and participant's personal goals.     Expected Outcomes Short Term: Increase workloads from initial exercise prescription for resistance, speed, and METs.;Short Term: Perform resistance training exercises routinely during rehab and  add in resistance training at home;Long Term: Improve cardiorespiratory fitness, muscular endurance and strength as measured by increased METs and functional capacity ( ) Short Term: Increase workloads from initial exercise prescription for resistance, speed, and METs.;Short Term: Perform resistance training exercises routinely during rehab and add in resistance training at home;Long Term: Improve cardiorespiratory fitness, muscular endurance and strength as measured by increased METs and functional capacity ( ) Short Term: Increase workloads from initial exercise prescription for resistance, speed, and METs.;Short Term: Perform resistance training exercises routinely during rehab and add in resistance training at home;Long Term: Improve cardiorespiratory fitness, muscular endurance and strength as measured by increased METs and functional capacity ( )     Able to understand and use rate of perceived exertion (RPE) scale Yes Yes Yes     Intervention Provide education and explanation on how to use RPE scale Provide education and explanation on how to use RPE scale Provide education and explanation on how to use RPE scale     Expected Outcomes Short Term: Able to use RPE daily in rehab to express subjective intensity level;Long Term:  Able to use RPE to guide intensity level when exercising independently Short Term: Able  to use RPE daily in rehab to express subjective intensity level;Long Term:  Able to use RPE to guide intensity level when exercising independently Short Term: Able to use RPE daily in rehab to express subjective intensity level;Long Term:  Able to use RPE to guide intensity level when exercising independently     Able to understand and use Dyspnea scale Yes Yes Yes     Intervention Provide education and explanation on how to use Dyspnea scale Provide education and explanation on how to use Dyspnea scale Provide education and explanation on how to use Dyspnea scale     Expected Outcomes  Short Term: Able to use Dyspnea scale daily in rehab to express subjective sense of shortness of breath during exertion;Long Term: Able to use Dyspnea scale to guide intensity level when exercising independently Short Term: Able to use Dyspnea scale daily in rehab to express subjective sense of shortness of breath during exertion;Long Term: Able to use Dyspnea scale to guide intensity level when exercising independently Short Term: Able to use Dyspnea scale daily in rehab to express subjective sense of shortness of breath during exertion;Long Term: Able to use Dyspnea scale to guide intensity level when exercising independently     Knowledge and understanding of Target Heart Rate Range (THRR) Yes Yes Yes     Intervention Provide education and explanation of THRR including how the numbers were predicted and where they are located for reference Provide education and explanation of THRR including how the numbers were predicted and where they are located for reference Provide education and explanation of THRR including how the numbers were predicted and where they are located for reference     Expected Outcomes Short Term: Able to state/look up THRR;Short Term: Able to use daily as guideline for intensity in rehab;Long Term: Able to use THRR to govern intensity when exercising independently Short Term: Able to state/look up THRR;Short Term: Able to use daily as guideline for intensity in rehab;Long Term: Able to use THRR to govern intensity when exercising independently Short Term: Able to state/look up THRR;Short Term: Able to use daily as guideline for intensity in rehab;Long Term: Able to use THRR to govern intensity when exercising independently     Understanding of Exercise Prescription Yes Yes Yes     Intervention Provide education, explanation, and written materials on patient's individual exercise prescription Provide education, explanation, and written materials on patient's individual exercise  prescription Provide education, explanation, and written materials on patient's individual exercise prescription     Expected Outcomes Short Term: Able to explain program exercise prescription;Long Term: Able to explain home exercise prescription to exercise independently Short Term: Able to explain program exercise prescription;Long Term: Able to explain home exercise prescription to exercise independently Short Term: Able to explain program exercise prescription;Long Term: Able to explain home exercise prescription to exercise independently              Exercise Goals Re-Evaluation :  Exercise Goals Re-Evaluation     Row Name 03/11/23 0844 04/17/23 1202           Exercise Goal Re-Evaluation   Exercise Goals Review Increase Physical Activity;Able to understand and use Dyspnea scale;Understanding of Exercise Prescription;Increase Strength and Stamina;Knowledge and understanding of Target Heart Rate Range (THRR);Able to understand and use rate of perceived exertion (RPE) scale Increase Physical Activity;Able to understand and use Dyspnea scale;Understanding of Exercise Prescription;Increase Strength and Stamina;Knowledge and understanding of Target Heart Rate Range (THRR);Able to understand and use rate of perceived exertion (  RPE) scale      Comments Elon Jester has completed 2 full exercise sessions. Elon Jester has had issues with her blood sugar being too low. Michele exercises on the track and Nustep for 15 min. She averages 1.92 METs on the track and 2.0 METs at level 1 on the Nustep. Elon Jester performs the warmup and cooldown mostly standing except for squats. She does an alternative exercise for sqauts. It is difficult to notate any discernable progressions since she has only completed 2 sessions. Will continue to monitor and progress able. Elon Jester has completed 13 exercise sessions. She exercises for 15 min on the track and Nustep. She averages 2.23 METs on the track and 2.3 METs at level 2 on the  Nustep. Elon Jester performs the warmup and cooldown standing without limitations. Elon Jester has not increased her METs for both exercise modes. I believe she has reached her conditioning limit. Will continue to monitor and progress as able.      Expected Outcomes Through exercise at rehab and home, the patient will decrease shortness of breath with daily activities and feel confident in carrying out an exercise regimen at home. Through exercise at rehab and home, the patient will decrease shortness of breath with daily activities and feel confident in carrying out an exercise regimen at home.               Discharge Exercise Prescription (Final Exercise Prescription Changes):  Exercise Prescription Changes - 04/04/23 1227       Response to Exercise   Blood Pressure (Admit) 122/64    Blood Pressure (Exit) 114/74    Heart Rate (Admit) 98 bpm    Heart Rate (Exercise) 116 bpm    Heart Rate (Exit) 100 bpm    Oxygen Saturation (Admit) 92 %    Oxygen Saturation (Exercise) 94 %    Oxygen Saturation (Exit) 97 %    Rating of Perceived Exertion (Exercise) 12    Perceived Dyspnea (Exercise) 1    Duration Continue with 30 min of aerobic exercise without signs/symptoms of physical distress.    Intensity THRR unchanged      Progression   Progression Continue to progress workloads to maintain intensity without signs/symptoms of physical distress.      Resistance Training   Training Prescription Yes    Weight red bands    Reps 10-15    Time 10 Minutes      NuStep   Level 2    SPM 60    Minutes 15    METs 2.3      Track   Laps 7    Minutes 15    METs 2.08             Nutrition:  Target Goals: Understanding of nutrition guidelines, daily intake of sodium 1500mg , cholesterol 200mg , calories 30% from fat and 7% or less from saturated fats, daily to have 5 or more servings of fruits and vegetables.  Biometrics:  Pre Biometrics - 02/20/23 1217       Pre Biometrics   Grip Strength  22 kg              Nutrition Therapy Plan and Nutrition Goals:  Nutrition Therapy & Goals - 04/23/23 1102       Nutrition Therapy   Diet Heart healthy/carbohydrate consistent diet    Drug/Food Interactions Statins/Certain Fruits      Personal Nutrition Goals   Nutrition Goal Patient to improve diet quality by using the plate method as a guide for meal  planning to include lean protein/plant protein, fruits, vegetables, whole grains, nonfat dairy as part of a well-balanced diet.    Personal Goal #2 Patient to identify strategies for weight loss of 0.5-2.0# per week.    Comments Goals in progress. Safiyah has started making some dietary changes including increased protein and decreased portion sizes; she started Ozempic for weight loss and blood sugar control in November 2023 at 309#. She has maintained her weight since starting with our program. She continues to deal with blood sugar lows and treating with simple carbohydrates; despite regularly occuring lows, her PCP has not recommended changing her basal insulin regiment. She does continue regular intake of sweets in the evenings.  Antrice will benefit from participation in pulmonary rehab for nutrition, exercise, and lifestyle modification.      Intervention Plan   Intervention Nutrition handout(s) given to patient.;Prescribe, educate and counsel regarding individualized specific dietary modifications aiming towards targeted core components such as weight, hypertension, lipid management, diabetes, heart failure and other comorbidities.    Expected Outcomes Short Term Goal: Understand basic principles of dietary content, such as calories, fat, sodium, cholesterol and nutrients.;Long Term Goal: Adherence to prescribed nutrition plan.             Nutrition Assessments:  Nutrition Assessments - 02/26/23 1350       Rate Your Plate Scores   Pre Score 57            MEDIFICTS Score Key: ?70 Need to make dietary changes  40-70 Heart  Healthy Diet ? 40 Therapeutic Level Cholesterol Diet  Flowsheet Row PULMONARY REHAB OTHER RESPIRATORY from [facility]/26/2023 in [facility]  Picture Your Plate Total Score on Admission 57      Picture Your Plate Scores: <40 Unhealthy dietary pattern with much room for improvement. 41-50 Dietary pattern unlikely to meet recommendations for good health and room for improvement. 51-60 More healthful dietary pattern, with some room for improvement.  >60 Healthy dietary pattern, although there may be some specific behaviors that could be improved.    Nutrition Goals Re-Evaluation:  Nutrition Goals Re-Evaluation     Row Name 02/26/23 1243 03/28/23 1014 04/23/23 1102         Goals   Current Weight 300 lb 14.9 oz (136.5 kg) 299 lb 2.6 oz (135.7 kg) 298 lb 8.1 oz (135.4 kg)     Comment lipids WNL, A1c 5.3, hx of fatty liver- AST 47, ALT 48 followed by gastro No new labs; most recent labs lipids WNL, A1c 5.3, hx of fatty liver- AST 47, ALT 48 followed by gastro No new labs; most recent labs lipids WNL, A1c 5.3, hx of fatty liver- AST 47, ALT 48 followed by gastro     Expected Outcome Elon Jester has started making some dietary changes including increased protein and decreased portion sizes; she started Ozempic for weight loss and blood sugar control in November 2023 at 309#. She reports am/fasting blood sugars <75 and some instances of overeating carbohydrates or eating in the middle of the night to sustain blood sugar levels. She will follow-up with PCP this week . Lashannon will benefit from participation in pulmonary rehab for nutrition, exercise, and lifestyle modification. Goals in progress. [place] has started making some dietary changes including increased protein and decreased portion sizes; she started Ozempic for weight loss and blood sugar control in November 2023 at 309#. She continues to deal with blood sugar lows and treating with simple carbohydrates  which does seem to impact her weight loss efforts; despite regularly occuring lows and normal A1c, her PCP has not recommended changing her insulin regiment. Evelyna will benefit from participation in pulmonary rehab for nutrition, exercise, and lifestyle modification. Goals in progress. Felesha has started making some dietary changes including increased protein and decreased portion sizes; she started Ozempic for weight loss and blood sugar control in November 2023 at 309#. She has maintained her weight since starting with our program. She continues to deal with blood sugar lows and treating with simple carbohydrates; despite regularly occuring lows, her PCP has not recommended changing her basal insulin regiment. She does continue regular intake of sweets in the evenings. Terrence will benefit from participation in pulmonary rehab for nutrition, exercise, and lifestyle modification.              Nutrition Goals Discharge (Final Nutrition Goals Re-Evaluation):  Nutrition Goals Re-Evaluation - 04/23/23 1102       Goals   Current Weight 298 lb 8.1 oz (135.4 kg)    Comment No new labs; most recent labs lipids WNL, A1c 5.3, hx of fatty liver- AST 47, ALT 48 followed by gastro    Expected Outcome Goals in progress. Delsie has started making some dietary changes including increased protein and decreased portion sizes; she started Ozempic for weight loss and blood sugar control in November 2023 at 309#. She has maintained her weight since starting with our program. She continues to deal with blood sugar lows and treating with simple carbohydrates; despite regularly occuring lows, her PCP has not recommended changing her basal insulin regiment. She does continue regular intake of sweets in the evenings. Ariel will benefit from participation in pulmonary rehab for nutrition, exercise, and lifestyle modification.             Psychosocial: Target Goals: Acknowledge presence or absence of significant depression and/or  stress, maximize coping skills, provide positive support system. Participant is able to verbalize types and ability to use techniques and skills needed for reducing stress and depression.  Initial Review & Psychosocial Screening:  Initial Psych Review & Screening - 02/20/23 1111       Initial Review   Current issues with History of Depression;Current Psychotropic Meds;Current Stress Concerns    Source of Stress Concerns Family;Chronic Illness    Comments Pt states more stress due to mother in law moving, her health, and her shortness of breath      Family Dynamics   Good Support System? Yes      Barriers   Psychosocial barriers to participate in program The patient should benefit from training in stress management and relaxation.      Screening Interventions   Interventions Encouraged to exercise;To provide support and resources with identified psychosocial needs;Provide feedback about the scores to participant    Expected Outcomes Long Term Goal: Stressors or current issues are controlled or eliminated.;Short Term goal: Identification and review with participant of any Quality of Life or Depression concerns found by scoring the questionnaire.;Long Term goal: The participant improves quality of Life and PHQ9 Scores as seen by post scores and/or verbalization of changes             Quality of Life Scores:  Scores of 19 and below usually indicate a poorer quality of life in these areas.  A difference of  2-3 points is a clinically meaningful difference.  A difference of 2-3 points in the total score of the Quality of Life Index has been associated with  significant improvement in overall quality of life, self-image, physical symptoms, and general health in studies assessing change in quality of life.  PHQ-9: Review Flowsheet       02/20/2023 06/17/2017 02/23/2016  Depression screen PHQ 2/9  Decreased Interest 1 0 2  Down, Depressed, Hopeless 1 0 0  PHQ - 2 Score 2 0 2  Altered  sleeping 1 - 3  Tired, decreased energy 3 - 3  Change in appetite 1 - 1  Feeling bad or failure about yourself  0 - 2  Trouble concentrating 1 - 0  Moving slowly or fidgety/restless 0 - 3  Suicidal thoughts 0 - 0  PHQ-9 Score 8 - 14  Difficult doing work/chores Somewhat difficult - Extremely dIfficult   Interpretation of Total Score  Total Score Depression Severity:  1-4 = Minimal depression, 5-9 = Mild depression, 10-14 = Moderate depression, 15-19 = Moderately severe depression, 20-27 = Severe depression   Psychosocial Evaluation and Intervention:  Psychosocial Evaluation - 02/20/23 1116       Psychosocial Evaluation & Interventions   Interventions Relaxation education    Comments Elon Jester states that she is stressed about her mother-in-law moving in with her and has stress about her health. Elon Jester is currently on psychotropic meds that keep her mental health stable    Expected Outcomes For Elon Jester to participate in PR without any psychosocial barriers or concerns    Continue Psychosocial Services  Follow up required by staff   We will continue to monitor Elon Jester for any needs that may arise            Psychosocial Re-Evaluation:  Psychosocial Re-Evaluation     Row Name 03/13/23 1119 04/15/23 1314           Psychosocial Re-Evaluation   Current issues with Current Depression;History of Depression;Current Psychotropic Meds;Current Stress Concerns Current Depression;History of Depression;Current Psychotropic Meds;Current Stress Concerns      Comments Elon Jester has been dealing with stress and depression. She is depressed about her health diagnosis and unable to do things she has done prior, like driving a car. Her mother-in-law moving in is also causing her stress. She is compliant in taking her psychotropic meds and feels her mental health is "ok". She currently declines a referral to see a therapist. Another issue is getting her blood sugars within a normal range. She has been  working with her medical team to correct this issue. It is not perfect, but small changes are being made. Elon Jester has been under stress and depression due to her decline in health and family dynamics. She is compliant in taking her psychotropic meds and states her mental health has been stable. She currently declines a referral to see a mental health professional. Elon Jester is also stressed about her ups and downs in her blood sugar. She has been working diligently with her MD about changes, but the process has been slow. She has worked with our dietician and progress has been made.      Expected Outcomes For Elon Jester to continue in PR with decreased stress and normal blood sugars For Elon Jester to continue to attend pulmonary rehab and to use positive and self-care techniques to manage her stress. For Elon Jester to gain better control of her blood sugars through medication and diet.      Interventions Encouraged to attend Pulmonary Rehabilitation for the exercise;Stress management education Encouraged to attend Pulmonary Rehabilitation for the exercise;Stress management education      Continue Psychosocial Services  Follow up required by  staff  We will continue to monitor Elon Jester for any barriers that may arise No Follow up required               Psychosocial Discharge (Final Psychosocial Re-Evaluation):  Psychosocial Re-Evaluation - 04/15/23 1314       Psychosocial Re-Evaluation   Current issues with Current Depression;History of Depression;Current Psychotropic Meds;Current Stress Concerns    Comments Elon Jester has been under stress and depression due to her decline in health and family dynamics. She is compliant in taking her psychotropic meds and states her mental health has been stable. She currently declines a referral to see a mental health professional. Elon Jester is also stressed about her ups and downs in her blood sugar. She has been working diligently with her MD about changes, but the process has  been slow. She has worked with our dietician and progress has been made.    Expected Outcomes For Elon Jester to continue to attend pulmonary rehab and to use positive and self-care techniques to manage her stress. For Elon Jester to gain better control of her blood sugars through medication and diet.    Interventions Encouraged to attend Pulmonary Rehabilitation for the exercise;Stress management education    Continue Psychosocial Services  No Follow up required             Education: Education Goals: Education classes will be provided on a weekly basis, covering required topics. Participant will state understanding/return demonstration of topics presented.  Learning Barriers/Preferences:  Learning Barriers/Preferences - 02/20/23 1117       Learning Barriers/Preferences   Learning Barriers Sight;Exercise Concerns    Learning Preferences Individual Instruction;Group Instruction;Verbal Instruction             Education Topics: Introduction to Pulmonary Rehab Group instruction provided by PowerPoint, verbal discussion, and written material to support subject matter. Instructor reviews what Pulmonary Rehab is, the purpose of the program, and how patients are referred.     Know Your Numbers Group instruction that is supported by a PowerPoint presentation. Instructor discusses importance of knowing and understanding resting, exercise, and post-exercise oxygen saturation, heart rate, and blood pressure. Oxygen saturation, heart rate, blood pressure, rating of perceived exertion, and dyspnea are reviewed along with a normal range for these values.  Flowsheet Row PULMONARY REHAB OTHER RESPIRATORY from 03/21/2023 in [facility]  Date 03/21/23  Educator Ep  Instruction Review Code 1- Verbalizes Understanding       Exercise for the Pulmonary Patient Group instruction that is supported by a PowerPoint presentation. Instructor discusses  benefits of exercise, core components of exercise, frequency, duration, and intensity of an exercise routine, importance of utilizing pulse oximetry during exercise, safety while exercising, and options of places to exercise outside of rehab.  Flowsheet Row PULMONARY REHAB OTHER RESPIRATORY from 03/14/2023 in [facility]  Date 03/14/23  Educator EP  Instruction Review Code 1- Verbalizes Understanding       MET Level  Group instruction provided by PowerPoint, verbal discussion, and written material to support subject matter. Instructor reviews what METs are and how to increase METs.  Flowsheet Row PULMONARY REHAB OTHER RESPIRATORY from 02/28/2023 in [facility]  Date 02/28/23  Educator EP  Instruction Review Code 1- Verbalizes Understanding       Pulmonary Medications Verbally interactive group education provided by instructor with focus on inhaled medications and proper  administration.   Anatomy and Physiology of the Respiratory System Group instruction provided by PowerPoint, verbal discussion, and written material to support subject matter. Instructor reviews respiratory cycle and anatomical components of the respiratory system and their functions. Instructor also reviews differences in obstructive and restrictive respiratory diseases with examples of each.    Oxygen Safety Group instruction provided by PowerPoint, verbal discussion, and written material to support subject matter. There is an overview of "What is Oxygen" and "Why do we need it".  Instructor also reviews how to create a safe environment for oxygen use, the importance of using oxygen as prescribed, and the risks of noncompliance. There is a brief discussion on traveling with oxygen and resources the patient may utilize. Flowsheet Row PULMONARY REHAB OTHER RESPIRATORY from 03/28/2023 in [facility]  Date 03/28/23  Educator RN  Instruction Review Code 1- Verbalizes Understanding       Oxygen Use Group instruction provided by PowerPoint, verbal discussion, and written material to discuss how supplemental oxygen is prescribed and different types of oxygen supply systems. Resources for more information are provided.  Flowsheet Row PULMONARY REHAB OTHER RESPIRATORY from 04/04/2023 in [facility]  Date 04/04/23  Educator RT  Instruction Review Code 1- Verbalizes Understanding       Breathing Techniques Group instruction that is supported by demonstration and informational handouts. Instructor discusses the benefits of pursed lip and diaphragmatic breathing and detailed demonstration on how to perform both.  Flowsheet Row PULMONARY REHAB OTHER RESPIRATORY from 04/18/2023 in [facility] V[facility], [facility] Lung [facility]  Date 04/18/23  Educator RT  Instruction Review Code 1- Verbalizes Understanding        Risk Factor Reduction Group instruction that is supported by a PowerPoint presentation. Instructor discusses the definition of a risk factor, different risk factors for pulmonary disease, and how the heart and lungs work together.   MD Day A group question and answer session with a medical doctor that allows participants to ask questions that relate to their pulmonary disease state.   Nutrition for the Pulmonary Patient Group instruction provided by PowerPoint slides, verbal discussion, and written materials to support subject matter. The instructor gives an explanation and review of healthy diet recommendations, which includes a discussion on weight management, recommendations for fruit and vegetable consumption, as well as protein, fluid, caffeine, fiber, sodium, sugar, and alcohol. Tips for eating when patients are short of breath are discussed.     Other Education Group or individual verbal, written, or video instructions that support the educational goals of the pulmonary rehab program.    Knowledge Questionnaire Score:  Knowledge Questionnaire Score - 02/20/23 1142       Knowledge Questionnaire Score   Pre Score 17/18             Core Components/Risk Factors/Patient Goals at Admission:  Personal Goals and Risk Factors at Admission - 02/20/23 1117       Core Components/Risk Factors/Patient Goals on Admission    Weight Management Obesity;Weight Loss    Improve shortness of breath with ADL's Yes    Intervention Provide education, individualized exercise plan and daily activity instruction to help decrease symptoms of SOB with activities of daily living.    Expected Outcomes Short Term: Improve cardiorespiratory fitness to achieve a reduction of symptoms when performing ADLs;Long Term: Be able to perform more ADLs without symptoms or delay  the onset of symptoms    Increase knowledge of respiratory medications and ability to use respiratory devices properly  Yes    Intervention Provide education and demonstration as needed of appropriate use of medications, inhalers, and oxygen therapy.    Expected Outcomes Short Term: Achieves understanding of medications use. Understands that oxygen is a medication prescribed by physician. Demonstrates appropriate use of inhaler and oxygen therapy.;Long Term: Maintain appropriate use of medications, inhalers, and oxygen therapy.    Diabetes Yes    Intervention Provide education about signs/symptoms and action to take for hypo/hyperglycemia.;Provide education about proper nutrition, including hydration, and aerobic/resistive exercise prescription along with prescribed medications to achieve blood glucose in normal ranges: Fasting glucose 65-99 mg/dL    Expected Outcomes Short Term: Participant verbalizes understanding of the signs/symptoms and immediate care of hyper/hypoglycemia, proper foot  care and importance of medication, aerobic/resistive exercise and nutrition plan for blood glucose control.;Long Term: Attainment of HbA1C < 7%.    Stress Yes    Intervention Offer individual and/or small group education and counseling on adjustment to heart disease, stress management and health-related lifestyle change. Teach and support self-help strategies.;Refer participants experiencing significant psychosocial distress to appropriate mental health specialists for further evaluation and treatment. When possible, include family members and significant others in education/counseling sessions.    Expected Outcomes Short Term: Participant demonstrates changes in health-related behavior, relaxation and other stress management skills, ability to obtain effective social support, and compliance with psychotropic medications if prescribed.;Long Term: Emotional wellbeing is indicated by absence of clinically significant psychosocial distress or social isolation.             Core Components/Risk Factors/Patient Goals Review:   Goals and Risk Factor Review     Row Name 03/13/23 1125 04/15/23 1327           Core Components/Risk Factors/Patient Goals Review   Personal Goals Review Weight Management/Obesity;Diabetes;Improve shortness of breath with ADL's;Develop more efficient breathing techniques such as purse lipped breathing and diaphragmatic breathing and practicing self-pacing with activity.;Stress;Increase knowledge of respiratory medications and ability to use respiratory devices properly. Weight Management/Obesity;Diabetes;Improve shortness of breath with ADL's;Develop more efficient breathing techniques such as purse lipped breathing and diaphragmatic breathing and practicing self-pacing with activity.;Stress;Increase knowledge of respiratory medications and ability to use respiratory devices properly.      Review Elon Jester has completed 4 PR classes so far. She has been working out on [employer]  and walking the Track for exercise. She hasn't made any improvement yet on the Nu[employer]. She has maintained her lap count while walking. She knows how to report her Rate of Perceived Exertion and her Dyspnea scale to staff. Elon Jester is also working with out dietician to lose weight. So far, no changes in her weight yet Elon Jester has only been to 4 classes thus far. Elon Jester has correctly stated when to use her inhaler and has properly demonstrated it with our respiratory therapist. She can also initiate pursed lip breathing when she is short of breath. Michele's oxygen saturation has continued to be within normal limits while on room air. We will continue to work on achieving Michele's goals. Elon Jester is working towards her goal of weight loss. She is currently working with our dietician. She has not made any progress towards her goal. Elon Jester is also working on improving her shortness of breath with ADLs. Elon Jester stated that she is slowly building stamina and can see a difference when she walks long distances. Elon Jester can initiate purse lipped & diaphragmatic breathing  on her own. She can also self-pace with activity when she becomes short of breath. These goals have been met by the patient. Elon Jester has also met her goal of increasing her knowledge of respiratory meds and using them properly. She has correctly stated when to use her inhaler and has properly demonstrated it with our respiratory therapist. Elon Jester is still working on blood glucose control and decreasing stress levels.      Expected Outcomes See Admission Goals See Admission Goals               Core Components/Risk Factors/Patient Goals at Discharge (Final Review):   Goals and Risk Factor Review - 04/15/23 1327       Core Components/Risk Factors/Patient Goals Review   Personal Goals Review Weight Management/Obesity;Diabetes;Improve shortness of breath with ADL's;Develop more efficient breathing techniques such as purse lipped breathing and  diaphragmatic breathing and practicing self-pacing with activity.;Stress;Increase knowledge of respiratory medications and ability to use respiratory devices properly.    Review Elon Jester is working towards her goal of weight loss. She is currently working with our dietician. She has not made any progress towards her goal. Elon Jester is also working on improving her shortness of breath with ADLs. Elon Jester stated that she is slowly building stamina and can see a difference when she walks long distances. Elon Jester can initiate purse lipped & diaphragmatic breathing on her own. She can also self-pace with activity when she becomes short of breath. These goals have been met by the patient. Elon Jester has also met her goal of increasing her knowledge of respiratory meds and using them properly. She has correctly stated when to use her inhaler and has properly demonstrated it with our respiratory therapist. Elon Jester is still working on blood glucose control and decreasing stress levels.    Expected Outcomes See Admission Goals             ITP Comments:  Pt is making expected progress toward Pulmonary Rehab goals after completing 15 sessions. Recommend continued exercise, life style modification, education, and utilization of breathing techniques to increase stamina and strength, while also decreasing shortness of breath with exertion.  Dr. Mechele Collin is Medical Director for Pulmonary Rehab at [facility].

## 2023-04-25 ENCOUNTER — Encounter (HOSPITAL_COMMUNITY): Payer: Medicare Other

## 2023-04-25 ENCOUNTER — Encounter (HOSPITAL_COMMUNITY)
Admission: RE | Admit: 2023-04-25 | Discharge: 2023-04-25 | Disposition: A | Discharge status: Home / Self Care | Payer: Medicare Other | Source: Ambulatory Visit | Attending: Pulmonary Disease | Admitting: Pulmonary Disease

## 2023-04-25 DIAGNOSIS — J4530 Mild persistent asthma, uncomplicated: Secondary | ICD-10-CM

## 2023-04-25 DIAGNOSIS — J9611 Chronic respiratory failure with hypoxia: Secondary | ICD-10-CM | POA: Diagnosis not present

## 2023-04-25 NOTE — Progress Notes (Signed)
Daily Session Note  Patient Details  Name: [name] MRN: [id number] Date of Birth: 12-31-1965 Referring Provider:   Doristine Devoid Pulmonary Rehab Walk Test from 02/20/2023 in [facility]  Referring Provider Dr. Vassie Loll       Encounter Date: 04/25/2023  Check In:  Session Check In - 04/25/23 1134       Check-In   Supervising physician immediately available to respond to emergencies [facility] MD immediately available    Physician(s) Eligha Bridegroom, NP    Location [facility]    Staff Present Samantha Belarus, RD, Dutch Gray, RN, BSN;Randi Reeve BS, ACSM-CEP, Exercise Physiologist;Latunya Kissick Earlene Plater, MS, ACSM-CEP, Exercise Physiologist;Casey Katrinka Blazing, Pennelope Bracken, RN, BSN    Virtual Visit No    Medication changes reported     No    Fall or balance concerns reported    No    Comments uses walker    Tobacco Cessation No Change    Warm-up and Cool-down Performed as group-led instruction    Resistance Training Performed Yes    VAD Patient? No    PAD/SET Patient? No      Pain Assessment   Currently in Pain? No/denies    Pain Score 0-No pain    Multiple Pain Sites No             Capillary Blood Glucose: No results found for this or any previous visit (from the past 24 hour(s)).    Social History   Tobacco Use  Smoking Status Never  Smokeless Tobacco Never    Goals Met:  Proper associated with RPD/PD & O2 Sat Independence with exercise equipment Exercise tolerated well No report of concerns or symptoms today Strength training completed today  Goals Unmet:  Not Applicable  Comments: Service time is from 1010 to 1148.    Dr. Mechele Collin is Medical Director for Pulmonary Rehab at [facility].

## 2023-04-29 ENCOUNTER — Ambulatory Visit: Payer: Medicare Other | Admitting: Podiatry

## 2023-04-29 DIAGNOSIS — R234 Changes in skin texture: Secondary | ICD-10-CM | POA: Diagnosis not present

## 2023-04-29 DIAGNOSIS — L97521 Non-pressure chronic ulcer of other part of left foot limited to breakdown of skin: Secondary | ICD-10-CM

## 2023-04-29 NOTE — Progress Notes (Signed)
Subjective: Chief Complaint  Patient presents with   Pre-ulcerative calluses    4 week follow up     57 year old female presents the office today for follow-up evaluation of a wound on her left foot.  Overall states that she is doing much better.  She denies any swelling redness or any drainage.  No fever or chills.    Objective: AAO x3, NAD DP/PT pulses palpable bilaterally, CRT less than 3 seconds  LEFT: Preulcerative hyperkeratotic lesion noted along the hallux plantarly.  On the plantar aspect there is a transverse skin fissure on the sulcus of the toe with superficial granulation tissue present without any surrounding erythema, ascending cellulitis there is no drainage or pus.  No fluctuance or crepitation there is no malodor.  No signs of infection.   No pain with calf compression, swelling, warmth, erythema bilaterally   Assessment: Left hallux pre-ulcerative callus; skin fissure  Plan: -All treatment options discussed with the patient including all alternatives, risks, complications. -Sharply debrided the hyperkeratotic lesion on the medial aspect of the hallux as well as along the area of the skin fissure.  Recommend antibiotic ointment on the skin fissure lesion for now.  Once this is healed recommend moisturizer daily. Continue offloading  Return in about 4 weeks (around 05/27/2023) for toe ulcer.  Vivi Barrack DPM

## 2023-04-30 ENCOUNTER — Encounter (HOSPITAL_COMMUNITY): Payer: Medicare Other

## 2023-05-02 ENCOUNTER — Encounter (HOSPITAL_COMMUNITY)
Admission: RE | Admit: 2023-05-02 | Discharge: 2023-05-02 | Disposition: A | Discharge status: Home / Self Care | Payer: Medicare Other | Source: Ambulatory Visit | Attending: Pulmonary Disease | Admitting: Pulmonary Disease

## 2023-05-02 ENCOUNTER — Encounter (HOSPITAL_COMMUNITY): Payer: Medicare Other

## 2023-05-02 DIAGNOSIS — J9611 Chronic respiratory failure with hypoxia: Secondary | ICD-10-CM

## 2023-05-02 DIAGNOSIS — J4530 Mild persistent asthma, uncomplicated: Secondary | ICD-10-CM

## 2023-05-02 NOTE — Progress Notes (Signed)
Daily Session Note  Patient Details  Name: SAMMI APRAHAMIAN MRN: [id number] Date of Birth: December 29, 1965 Referring Provider:   Doristine Devoid Pulmonary Rehab Walk Test from 02/20/2023 in [facility], [facility]  Referring Provider Dr. Vassie Loll       Encounter Date: 05/02/2023  Check In:  Session Check In - 05/02/23 1148       Check-In   Supervising physician immediately available to respond to emergencies [facility] immediately available    Physician(s) Robin Searing, NP    [facility]    Staff Present Samantha Belarus, RD, Dutch Gray, RN, BSN;Randi Dionisio Paschal, ACSM-CEP, Exercise Physiologist;Carlette Les Pou, RN, Doris Cheadle, MS, ACSM-CEP, Exercise Physiologist;Larri Yehle Katrinka Blazing, RT    Virtual Visit No    Medication changes reported     No    Fall or balance concerns reported    Yes    Comments fell at home    Tobacco Cessation No Change    Warm-up and Cool-down Performed as group-led instruction    Resistance Training Performed Yes    VAD Patient? No    PAD/SET Patient? No      Pain Assessment   Currently in Pain? No/denies    Multiple Pain Sites No             Capillary Blood Glucose: No results found for this or any previous visit (from the past 24 hour(s)).    Social History   Tobacco Use  Smoking Status Never  Smokeless Tobacco Never    Goals Met:  Proper associated with RPD/PD & O2 Sat Independence with exercise equipment Exercise tolerated well No report of concerns or symptoms today Strength training completed today  Goals Unmet:  Not Applicable  Comments: Service time is from 1008 to 1210.    Dr. Mechele Collin is Medical Director for Pulmonary Rehab at [facility].

## 2023-05-03 NOTE — Telephone Encounter (Signed)
Called and spoke to Patient. She was able to get a 30 day supply of hyoscyamines for $36.  Her insurance will pay for bentyl so she can get that also.  She indicates that Dr. Chales Abrahams has her on both of these medications and has for years.  Since she is now able to get both medications she did not have any other questions.

## 2023-05-03 NOTE — Telephone Encounter (Signed)
PT is calling to get an update on hyoscyamine. Would like to discuss other options since her insurance will not cover it. Please advise.

## 2023-05-07 ENCOUNTER — Encounter (HOSPITAL_COMMUNITY)
Admission: RE | Admit: 2023-05-07 | Discharge: 2023-05-07 | Disposition: A | Discharge status: Home / Self Care | Payer: Medicare Other | Source: Ambulatory Visit | Attending: Pulmonary Disease | Admitting: Pulmonary Disease

## 2023-05-07 ENCOUNTER — Encounter (HOSPITAL_COMMUNITY): Payer: Medicare Other

## 2023-05-07 VITALS — Wt 298.3 lb

## 2023-05-07 DIAGNOSIS — J9611 Chronic respiratory failure with hypoxia: Secondary | ICD-10-CM

## 2023-05-07 DIAGNOSIS — J4530 Mild persistent asthma, uncomplicated: Secondary | ICD-10-CM

## 2023-05-09 ENCOUNTER — Encounter (HOSPITAL_COMMUNITY)
Admission: RE | Admit: 2023-05-09 | Discharge: 2023-05-09 | Disposition: A | Discharge status: Home / Self Care | Payer: Medicare Other | Source: Ambulatory Visit | Attending: Pulmonary Disease | Admitting: Pulmonary Disease

## 2023-05-09 ENCOUNTER — Encounter (HOSPITAL_COMMUNITY): Payer: Medicare Other

## 2023-05-09 DIAGNOSIS — J9611 Chronic respiratory failure with hypoxia: Secondary | ICD-10-CM | POA: Diagnosis not present

## 2023-05-09 NOTE — Progress Notes (Signed)
Daily Session Note  Patient Details  Name: [name] MRN: [id number] Date of Birth: 03-May-1966 Referring Provider:   Doristine Devoid Pulmonary Rehab Walk Test from 02/20/2023 in [facility]  Referring Provider Dr. Vassie Loll       Encounter Date: 05/07/2023  Check In:   Capillary Blood Glucose: No results found for this or any previous visit (from the past 24 hour(s)).    Social History   Tobacco Use  Smoking Status Never  Smokeless Tobacco Never    Goals Met:  Proper associated with RPD/PD & O2 Sat Independence with exercise equipment Exercise tolerated well No report of concerns or symptoms today Strength training completed today  Goals Unmet:  Not Applicable  Comments: Service time is from 1014 to 1140.    Dr. Mechele Collin is Medical Director for Pulmonary Rehab at [facility].

## 2023-05-09 NOTE — Progress Notes (Signed)
Daily Session Note  Patient Details  Name: [name] MRN: [id number] Date of Birth: May 06, 1966 Referring Provider:   Doristine Devoid Pulmonary Rehab Walk Test from 02/20/2023 in [facility]  Referring Provider Dr. Vassie Loll       Encounter Date: 05/09/2023  Check In:  Session Check In - 05/09/23 1140       Check-In   Supervising physician immediately available to respond to emergencies [facility] immediately available    Physician(s) Edd Fabian, NP    [facility]    Staff Present Samantha Belarus, RD, Dutch Gray, RN, BSN;Randi Reeve BS, ACSM-CEP, Exercise Physiologist;Johnny Hale Bogus, MS, Exercise Physiologist;Kaylee Earlene Plater, MS, ACSM-CEP, Exercise Physiologist;Monserrat Vidaurri Katrinka Blazing, RT    Virtual Visit No    Medication changes reported     No    Fall or balance concerns reported    No    Tobacco Cessation No Change    Warm-up and Cool-down Performed as group-led instruction    Resistance Training Performed Yes    VAD Patient? No    PAD/SET Patient? No      Pain Assessment   Currently in Pain? No/denies    Multiple Pain Sites No             Capillary Blood Glucose: No results found for this or any previous visit (from the past 24 hour(s)).    Social History   Tobacco Use  Smoking Status Never  Smokeless Tobacco Never    Goals Met:  Proper associated with RPD/PD & O2 Sat Independence with exercise equipment Exercise tolerated well No report of concerns or symptoms today Strength training completed today  Goals Unmet:  Not Applicable  Comments: Service time is from 1014 to 1145.    Dr. Mechele Collin is Medical Director for Pulmonary Rehab at [facility].

## 2023-05-13 ENCOUNTER — Other Ambulatory Visit: Payer: Self-pay | Admitting: Neurology

## 2023-05-13 DIAGNOSIS — M542 Cervicalgia: Secondary | ICD-10-CM

## 2023-05-14 ENCOUNTER — Encounter (HOSPITAL_COMMUNITY): Payer: Medicare Other

## 2023-05-14 ENCOUNTER — Encounter (HOSPITAL_COMMUNITY)
Admission: RE | Admit: 2023-05-14 | Discharge: 2023-05-14 | Disposition: A | Discharge status: Home / Self Care | Payer: Medicare Other | Source: Ambulatory Visit | Attending: Pulmonary Disease | Admitting: Pulmonary Disease

## 2023-05-14 DIAGNOSIS — J4530 Mild persistent asthma, uncomplicated: Secondary | ICD-10-CM | POA: Insufficient documentation

## 2023-05-14 DIAGNOSIS — J9611 Chronic respiratory failure with hypoxia: Secondary | ICD-10-CM | POA: Diagnosis present

## 2023-05-14 NOTE — Progress Notes (Signed)
Daily Session Note  Patient Details  Name: [name] MRN: [id number] Date of Birth: 06-06-1966 Referring Provider:   Doristine Devoid Pulmonary Rehab Walk Test from 02/20/2023 in [facility]  Referring Provider Dr. Vassie Loll       Encounter Date: 05/14/2023  Check In:  Session Check In - 05/14/23 1050       Check-In   Supervising physician immediately available to respond to emergencies [facility] immediately available    Physician(s) Carlyon Shadow, NP    [facility]    Staff Present Samantha Belarus, RD, LDN;Lamia Mariner Dionisio Paschal, ACSM-CEP, Exercise Physiologist;Carlette Les Pou, RN, Doris Cheadle, MS, ACSM-CEP, Exercise Physiologist;Casey Katrinka Blazing, RT    Virtual Visit No    Medication changes reported     No    Fall or balance concerns reported    No    Tobacco Cessation No Change    Warm-up and Cool-down Performed as group-led instruction    Resistance Training Performed Yes    VAD Patient? No    PAD/SET Patient? No      Pain Assessment   Currently in Pain? No/denies    Multiple Pain Sites No             Capillary Blood Glucose: No results found for this or any previous visit (from the past 24 hour(s)).    Social History   Tobacco Use  Smoking Status Never  Smokeless Tobacco Never    Goals Met:  Independence with exercise equipment Exercise tolerated well No report of concerns or symptoms today Strength training completed today  Goals Unmet:  Not Applicable  Comments: Service time is from 1008 to 1140.    Dr. Mechele Collin is Medical Director for Pulmonary Rehab at [facility].

## 2023-05-16 ENCOUNTER — Encounter (HOSPITAL_COMMUNITY): Payer: Medicare Other

## 2023-05-16 ENCOUNTER — Encounter (HOSPITAL_COMMUNITY)
Admission: RE | Admit: 2023-05-16 | Discharge: 2023-05-16 | Disposition: A | Discharge status: Home / Self Care | Payer: Medicare Other | Source: Ambulatory Visit | Attending: Pulmonary Disease | Admitting: Pulmonary Disease

## 2023-05-16 DIAGNOSIS — J9611 Chronic respiratory failure with hypoxia: Secondary | ICD-10-CM

## 2023-05-16 NOTE — Progress Notes (Signed)
Daily Session Note  Patient Details  Name: DEVANSHI CRUSE MRN: [id number] Date of Birth: Jan 30, 1966 Referring Provider:   Doristine Devoid Pulmonary Rehab Walk Test from 02/20/2023 in [facility], [facility]  Referring Provider Dr. Vassie Loll       Encounter Date: 05/16/2023  Check In:  Session Check In - 05/16/23 1026       Check-In   Supervising physician immediately available to respond to emergencies [facility] immediately available    Physician(s) Jari Favre, PA    Location [facility]    Staff Present Samantha Belarus, RD, LDN;Randi Dionisio Paschal, ACSM-CEP, Exercise Physiologist;Kaylee Earlene Plater, MS, ACSM-CEP, Exercise Physiologist;Faryn Sieg Katrinka Blazing, RT    Virtual Visit No    Medication changes reported     No    Fall or balance concerns reported    No    Tobacco Cessation No Change    Warm-up and Cool-down Not performed (comment)    Resistance Training Performed No    VAD Patient? No    PAD/SET Patient? No      Pain Assessment   Currently in Pain? No/denies    Multiple Pain Sites No             Capillary Blood Glucose: No results found for this or any previous visit (from the past 24 hour(s)).    Social History   Tobacco Use  Smoking Status Never  Smokeless Tobacco Never    Goals Met:  Proper associated with RPD/PD & O2 Sat Independence with exercise equipment Exercise tolerated well No report of concerns or symptoms today Strength training completed today  Goals Unmet:  Not Applicable  Comments: Service time is from 1000 to 1020.    Dr. Mechele Collin is Medical Director for Pulmonary Rehab at [facility].

## 2023-05-17 NOTE — Progress Notes (Signed)
Discharge Progress Report  Patient Details  Name: [name] MRN: [id number] Date of Birth: 12-Oct-1966 Referring Provider:   Doristine Devoid Pulmonary Rehab Walk Test from 02/20/2023 in [facility]  Referring Provider Dr. Vassie Loll        Number of Visits: 21  Reason for Discharge:  Patient has met program and personal goals.  Smoking History:  Social History   Tobacco Use  Smoking Status Never  Smokeless Tobacco Never    Diagnosis:  Chronic respiratory failure with hypoxia [facility])  ADL [facility]:  Pulmonary Assessment Scores     Row Name 02/20/23 1143 02/20/23 1220 05/09/23 1607     ADL [facility]   ADL Phase -- -- Exit   SOB Score total 61 -- 77     CAT Score   CAT Score 28 -- 25     mMRC Score   mMRC Score -- 2 --            Initial Exercise Prescription:  Initial Exercise Prescription - 02/20/23 1300       Date of Initial Exercise RX and Referring Provider   Date 02/20/23    Referring Provider Dr. Vassie Loll    Expected Discharge Date 05/16/23      Recumbant Elliptical   Level 1    Minutes 15      Track   Laps 8    Minutes 15      Prescription Details   Frequency (times per week) 2    Duration Progress to 30 minutes of continuous aerobic without signs/symptoms of physical distress      Intensity   THRR 40-80% of Max Heartrate 66-132    Ratings of Perceived Exertion 11-13    Perceived Dyspnea 0-4      Progression   Progression Continue to progress workloads to maintain intensity without signs/symptoms of physical distress.      Resistance Training   Training Prescription Yes    Weight red bands    Reps 10-15             Discharge Exercise Prescription (Final Exercise Prescription Changes):  Exercise Prescription Changes - 05/07/23 1200       Response to Exercise   Blood Pressure (Admit) 96/58    Blood Pressure (Exercise) 114/64    Blood Pressure (Exit) 98/58    Heart Rate (Admit)  93 bpm    Heart Rate (Exercise) 122 bpm    Heart Rate (Exit) 92 bpm    Oxygen Saturation (Admit) 95 %    Oxygen Saturation (Exercise) 94 %    Oxygen Saturation (Exit) 95 %    Rating of Perceived Exertion (Exercise) 11    Perceived Dyspnea (Exercise) 1    Duration Continue with 30 min of aerobic exercise without signs/symptoms of physical distress.    Intensity THRR unchanged      Resistance Training   Training Prescription Yes    Weight red bands    Reps 10-15    Time 10 Minutes      NuStep   Level 2    Minutes 15    METs 2.3      Track   Laps 9    Minutes 15    METs 2.38             Functional Capacity:  6 Minute Walk     Row Name 02/20/23 1212 05/16/23 1543       6 Minute Walk  Phase Initial Discharge    Distance 812 feet 1455 feet    Distance % Change -- 79.19 %    Distance Feet Change -- 643 ft    Walk Time 6 minutes 6 minutes    # of Rest Breaks 1  2:43-3:40 0    MPH 1.54 2.17    METS 1.92 2.83    RPE 17 13.5    Perceived Dyspnea  2 2    VO2 Peak 6.71 9.89    Symptoms -- No    Resting HR 82 bpm 77 bpm    Resting BP 102/60 104/60    Resting Oxygen Saturation  96 % 96 %    Exercise Oxygen Saturation  during 6 min walk 93 % 95 %    Max Ex. HR 132 bpm 139 bpm    Max Ex. BP 122/64 148/66    2 Minute Post BP 112/68 126/60      Interval HR   1 Minute HR 111 98    2 Minute HR 124 116    3 Minute HR 126 126    4 Minute HR 115 133    5 Minute HR 119 135    6 Minute HR 132 139    2 Minute Post HR 84 95    Interval Heart Rate? Yes Yes      Interval Oxygen   Interval Oxygen? Yes Yes    Baseline Oxygen Saturation % 96 % 96 %    1 Minute Oxygen Saturation % 98 % 95 %    1 Minute Liters of Oxygen 0 L 0 L    2 Minute Oxygen Saturation % 94 % 96 %    2 Minute Liters of Oxygen 0 L 0 L    3 Minute Oxygen Saturation % 96 % 95 %    3 Minute Liters of Oxygen 0 L 0 L    4 Minute Oxygen Saturation % 95 % 95 %    4 Minute Liters of Oxygen 0 L 0 L    5  Minute Oxygen Saturation % 93 % 95 %    5 Minute Liters of Oxygen 0 L 0 L    6 Minute Oxygen Saturation % 94 % 95 %    6 Minute Liters of Oxygen 0 L 0 L    2 Minute Post Oxygen Saturation % 96 % 98 %    2 Minute Post Liters of Oxygen 0 L 0 L             Psychological, QOL, Others - Outcomes: PHQ 2/9:    05/09/2023    4:05 PM 02/20/2023   11:21 AM 06/17/2017   10:30 AM 02/23/2016    9:50 AM  Depression screen PHQ 2/9  Decreased Interest 1 1 0 2  Down, Depressed, Hopeless 1 1 0 0  PHQ - 2 Score 2 2 0 2  Altered sleeping 0 1  3  Tired, decreased energy 0 3  3  Change in appetite 0 1  1  Feeling bad or failure about yourself  0 0  2  Trouble concentrating 0 1  0  Moving slowly or fidgety/restless 0 0  3  Suicidal thoughts 0 0  0  PHQ-9 Score 2 8  14   Difficult doing work/chores Somewhat difficult Somewhat difficult  Extremely dIfficult    Quality of Life:   Personal Goals: Goals established at orientation with interventions provided to work toward goal.  Personal Goals and Risk Factors at  Admission - 02/20/23 1117       Core Components/Risk Factors/Patient Goals on Admission    Weight Management Obesity;Weight Loss    Improve shortness of breath with ADL's Yes    Intervention Provide education, individualized exercise plan and daily activity instruction to help decrease symptoms of SOB with activities of daily living.    Expected Outcomes Short Term: Improve cardiorespiratory fitness to achieve a reduction of symptoms when performing ADLs;Long Term: Be able to perform more ADLs without symptoms or delay the onset of symptoms    Increase knowledge of respiratory medications and ability to use respiratory devices properly  Yes    Intervention Provide education and demonstration as needed of appropriate use of medications, inhalers, and oxygen therapy.    Expected Outcomes Short Term: Achieves understanding of medications use. Understands that oxygen is a medication prescribed  by physician. Demonstrates appropriate use of inhaler and oxygen therapy.;Long Term: Maintain appropriate use of medications, inhalers, and oxygen therapy.    Diabetes Yes    Intervention Provide education about signs/symptoms and action to take for hypo/hyperglycemia.;Provide education about proper nutrition, including hydration, and aerobic/resistive exercise prescription along with prescribed medications to achieve blood glucose in normal ranges: Fasting glucose 65-99 mg/dL    Expected Outcomes Short Term: Participant verbalizes understanding of the signs/symptoms and immediate care of hyper/hypoglycemia, proper foot care and importance of medication, aerobic/resistive exercise and nutrition plan for blood glucose control.;Long Term: Attainment of HbA1C < 7%.    Stress Yes    Intervention Offer individual and/or small group education and counseling on adjustment to heart disease, stress management and health-related lifestyle change. Teach and support self-help strategies.;Refer participants experiencing significant psychosocial distress to appropriate mental health specialists for further evaluation and treatment. When possible, include family members and significant others in education/counseling sessions.    Expected Outcomes Short Term: Participant demonstrates changes in health-related behavior, relaxation and other stress management skills, ability to obtain effective social support, and compliance with psychotropic medications if prescribed.;Long Term: Emotional wellbeing is indicated by absence of clinically significant psychosocial distress or social isolation.              Personal Goals Discharge:  Goals and Risk Factor Review     Row Name 03/13/23 1125 04/15/23 1327           Core Components/Risk Factors/Patient Goals Review   Personal Goals Review Weight Management/Obesity;Diabetes;Improve shortness of breath with ADL's;Develop more efficient breathing techniques such as purse  lipped breathing and diaphragmatic breathing and practicing self-pacing with activity.;Stress;Increase knowledge of respiratory medications and ability to use respiratory devices properly. Weight Management/Obesity;Diabetes;Improve shortness of breath with ADL's;Develop more efficient breathing techniques such as purse lipped breathing and diaphragmatic breathing and practicing self-pacing with activity.;Stress;Increase knowledge of respiratory medications and ability to use respiratory devices properly.      Review [name] has completed 4 PR classes so far. She has been working out on [employer] and walking the Track for exercise. She hasn't made any improvement yet on the NuStep. She has maintained her lap count while walking. She knows how to report her Rate of Perceived Exertion and her Dyspnea scale to staff. [name] is also working with out dietician to lose weight. So far, no changes in her weight yet [name] has only been to 4 classes thus far. [name] has correctly stated when to use her inhaler and has properly demonstrated it with our respiratory therapist. She can also initiate pursed lip breathing when she is short of breath. [name]'s  oxygen saturation has continued to be within normal limits while on room air. We will continue to work on achieving [name]'s goals. [name] is working towards her goal of weight loss. She is currently working with our dietician. She has not made any progress towards her goal. [name] is also working on improving her shortness of breath with ADLs. [name] stated that she is slowly building stamina and can see a difference when she walks long distances. [name] can initiate purse lipped & diaphragmatic breathing on her own. She can also self-pace with activity when she becomes short of breath. These goals have been met by the patient. [name] has also met her goal of increasing her knowledge of respiratory meds and using them properly. She has correctly stated when to use  her inhaler and has properly demonstrated it with our respiratory therapist. [name] is still working on blood glucose control and decreasing stress levels.      Expected Outcomes See Admission Goals See Admission Goals               Exercise Goals and Review:  Exercise Goals     Row Name 02/20/23 1111 03/11/23 0843 04/17/23 1158         Exercise Goals   Increase Physical Activity Yes Yes Yes     Intervention Provide advice, education, support and counseling about physical activity/exercise needs.;Develop an individualized exercise prescription for aerobic and resistive training based on initial evaluation findings, risk stratification, comorbidities and participant's personal goals. Provide advice, education, support and counseling about physical activity/exercise needs.;Develop an individualized exercise prescription for aerobic and resistive training based on initial evaluation findings, risk stratification, comorbidities and participant's personal goals. Provide advice, education, support and counseling about physical activity/exercise needs.;Develop an individualized exercise prescription for aerobic and resistive training based on initial evaluation findings, risk stratification, comorbidities and participant's personal goals.     Expected Outcomes Short Term: Attend rehab on a regular basis to increase amount of physical activity.;Long Term: Exercising regularly at least 3-5 days a week.;Long Term: Add in home exercise to make exercise part of routine and to increase amount of physical activity. Short Term: Attend rehab on a regular basis to increase amount of physical activity.;Long Term: Exercising regularly at least 3-5 days a week.;Long Term: Add in home exercise to make exercise part of routine and to increase amount of physical activity. Short Term: Attend rehab on a regular basis to increase amount of physical activity.;Long Term: Exercising regularly at least 3-5 days a week.;Long  Term: Add in home exercise to make exercise part of routine and to increase amount of physical activity.     Increase Strength and Stamina Yes Yes Yes     Intervention Provide advice, education, support and counseling about physical activity/exercise needs.;Develop an individualized exercise prescription for aerobic and resistive training based on initial evaluation findings, risk stratification, comorbidities and participant's personal goals. Provide advice, education, support and counseling about physical activity/exercise needs.;Develop an individualized exercise prescription for aerobic and resistive training based on initial evaluation findings, risk stratification, comorbidities and participant's personal goals. Provide advice, education, support and counseling about physical activity/exercise needs.;Develop an individualized exercise prescription for aerobic and resistive training based on initial evaluation findings, risk stratification, comorbidities and participant's personal goals.     Expected Outcomes Short Term: Increase workloads from initial exercise prescription for resistance, speed, and METs.;Short Term: Perform resistance training exercises routinely during rehab and add in resistance training at home;Long Term: Improve cardiorespiratory fitness, muscular endurance and strength as  measured by increased METs and functional capacity ( ) Short Term: Increase workloads from initial exercise prescription for resistance, speed, and METs.;Short Term: Perform resistance training exercises routinely during rehab and add in resistance training at home;Long Term: Improve cardiorespiratory fitness, muscular endurance and strength as measured by increased METs and functional capacity ( ) Short Term: Increase workloads from initial exercise prescription for resistance, speed, and METs.;Short Term: Perform resistance training exercises routinely during rehab and add in resistance training at home;Long  Term: Improve cardiorespiratory fitness, muscular endurance and strength as measured by increased METs and functional capacity ( )     Able to understand and use rate of perceived exertion (RPE) scale Yes Yes Yes     Intervention Provide education and explanation on how to use RPE scale Provide education and explanation on how to use RPE scale Provide education and explanation on how to use RPE scale     Expected Outcomes Short Term: Able to use RPE daily in rehab to express subjective intensity level;Long Term:  Able to use RPE to guide intensity level when exercising independently Short Term: Able to use RPE daily in rehab to express subjective intensity level;Long Term:  Able to use RPE to guide intensity level when exercising independently Short Term: Able to use RPE daily in rehab to express subjective intensity level;Long Term:  Able to use RPE to guide intensity level when exercising independently     Able to understand and use Dyspnea scale Yes Yes Yes     Intervention Provide education and explanation on how to use Dyspnea scale Provide education and explanation on how to use Dyspnea scale Provide education and explanation on how to use Dyspnea scale     Expected Outcomes Short Term: Able to use Dyspnea scale daily in rehab to express subjective sense of shortness of breath during exertion;Long Term: Able to use Dyspnea scale to guide intensity level when exercising independently Short Term: Able to use Dyspnea scale daily in rehab to express subjective sense of shortness of breath during exertion;Long Term: Able to use Dyspnea scale to guide intensity level when exercising independently Short Term: Able to use Dyspnea scale daily in rehab to express subjective sense of shortness of breath during exertion;Long Term: Able to use Dyspnea scale to guide intensity level when exercising independently     Knowledge and understanding of Target Heart Rate Range (THRR) Yes Yes Yes     Intervention Provide  education and explanation of THRR including how the numbers were predicted and where they are located for reference Provide education and explanation of THRR including how the numbers were predicted and where they are located for reference Provide education and explanation of THRR including how the numbers were predicted and where they are located for reference     Expected Outcomes Short Term: Able to state/look up THRR;Short Term: Able to use daily as guideline for intensity in rehab;Long Term: Able to use THRR to govern intensity when exercising independently Short Term: Able to state/look up THRR;Short Term: Able to use daily as guideline for intensity in rehab;Long Term: Able to use THRR to govern intensity when exercising independently Short Term: Able to state/look up THRR;Short Term: Able to use daily as guideline for intensity in rehab;Long Term: Able to use THRR to govern intensity when exercising independently     Understanding of Exercise Prescription Yes Yes Yes     Intervention Provide education, explanation, and written materials on patient's individual exercise prescription Provide education, explanation, and written materials on patient's  individual exercise prescription Provide education, explanation, and written materials on patient's individual exercise prescription     Expected Outcomes Short Term: Able to explain program exercise prescription;Long Term: Able to explain home exercise prescription to exercise independently Short Term: Able to explain program exercise prescription;Long Term: Able to explain home exercise prescription to exercise independently Short Term: Able to explain program exercise prescription;Long Term: Able to explain home exercise prescription to exercise independently              Exercise Goals Re-Evaluation:  Exercise Goals Re-Evaluation     Row Name 03/11/23 0844 04/17/23 1202           Exercise Goal Re-Evaluation   Exercise Goals Review Increase  Physical Activity;Able to understand and use Dyspnea scale;Understanding of Exercise Prescription;Increase Strength and Stamina;Knowledge and understanding of Target Heart Rate Range (THRR);Able to understand and use rate of perceived exertion (RPE) scale Increase Physical Activity;Able to understand and use Dyspnea scale;Understanding of Exercise Prescription;Increase Strength and Stamina;Knowledge and understanding of Target Heart Rate Range (THRR);Able to understand and use rate of perceived exertion (RPE) scale      Comments [name] has completed 2 full exercise sessions. [name] has had issues with her blood sugar being too low. [name] exercises on the track and Nustep for 15 min. She averages 1.92 METs on the track and 2.0 METs at level 1 on the Nustep. [name] performs the warmup and cooldown mostly standing except for squats. She does an alternative exercise for sqauts. It is difficult to notate any discernable progressions since she has only completed 2 sessions. Will continue to monitor and progress able. [name] has completed 13 exercise sessions. She exercises for 15 min on the track and Nustep. She averages 2.23 METs on the track and 2.3 METs at level 2 on the Nustep. [name] performs the warmup and cooldown standing without limitations. [name] has not increased her METs for both exercise modes. I believe she has reached her conditioning limit. Will continue to monitor and progress as able.      Expected Outcomes Through exercise at rehab and home, the patient will decrease shortness of breath with daily activities and feel confident in carrying out an exercise regimen at home. Through exercise at rehab and home, the patient will decrease shortness of breath with daily activities and feel confident in carrying out an exercise regimen at home.               Nutrition & Weight - Outcomes:  Pre Biometrics - 02/20/23 1217       Pre Biometrics   Grip Strength 22 kg               Nutrition:  Nutrition Therapy & Goals - 05/16/23 1109       Nutrition Therapy   Diet Heart healthy/carbohydrate consistent diet    Drug/Food Interactions Statins/Certain Fruits      Personal Nutrition Goals   Nutrition Goal Patient to improve diet quality by using the plate method as a guide for meal planning to include lean protein/plant protein, fruits, vegetables, whole grains, nonfat dairy as part of a well-balanced diet.    Personal Goal #2 Patient to identify strategies for weight loss of 0.5-2.0# per week.    Comments Goals in action. [name] has started making some dietary changes including increased protein and decreased portion sizes; she started Ozempic for weight loss and blood sugar control in November 2023 at 309#. She has maintained her weight since starting with our program. She  continues to deal with blood sugar lows and treating with simple carbohydrates; despite regularly occuring lows, her PCP has not recommended changing her basal insulin regiment. [name] has started working on decreasing sweets/refined carbohydrates to aid with blood sugar and weight loss.  [name] will benefit from continuation of lifestyle changes and continued follow-up with endocrinology.      Intervention Plan   Intervention Nutrition handout(s) given to patient.;Prescribe, educate and counsel regarding individualized specific dietary modifications aiming towards targeted core components such as weight, hypertension, lipid management, diabetes, heart failure and other comorbidities.    Expected Outcomes Short Term Goal: Understand basic principles of dietary content, such as calories, fat, sodium, cholesterol and nutrients.;Long Term Goal: Adherence to prescribed nutrition plan.             Nutrition Discharge:  Nutrition Assessments - 05/09/23 1411       Rate Your Plate Scores   Post Score 56             Education Questionnaire Score:  Knowledge Questionnaire Score - 05/09/23 1609        Knowledge Questionnaire Score   Post Score 18/18             Goals reviewed with patient; copy given to patient.

## 2023-05-26 ENCOUNTER — Ambulatory Visit
Admission: RE | Admit: 2023-05-26 | Discharge: 2023-05-26 | Disposition: A | Discharge status: Home / Self Care | Payer: Medicare Other | Source: Ambulatory Visit | Attending: Neurology | Admitting: Neurology

## 2023-05-26 DIAGNOSIS — M542 Cervicalgia: Secondary | ICD-10-CM

## 2023-05-28 ENCOUNTER — Ambulatory Visit: Payer: Medicare Other | Admitting: Podiatry

## 2023-06-17 ENCOUNTER — Ambulatory Visit: Payer: Medicare Other | Admitting: Podiatry

## 2023-06-17 ENCOUNTER — Encounter: Payer: Self-pay | Admitting: Podiatry

## 2023-06-17 DIAGNOSIS — L97521 Non-pressure chronic ulcer of other part of left foot limited to breakdown of skin: Secondary | ICD-10-CM | POA: Diagnosis not present

## 2023-06-17 DIAGNOSIS — R234 Changes in skin texture: Secondary | ICD-10-CM | POA: Diagnosis not present

## 2023-06-24 NOTE — Progress Notes (Signed)
Subjective: Chief Complaint  Patient presents with   Toe Pain    Follow up skin fissure hallux left   "Its split open again"    57 year old female presents the office today for follow-up evaluation of a wound on her left foot.  The original wound has been doing well and has not reoccurred however she has noticed a skin fissure, ulcer on the plantar aspect of the toe.  No drainage or pus or increasing swelling or redness.  No fevers or chills.  No other concerns today.   Objective: AAO x3, NAD DP/PT pulses palpable bilaterally, CRT less than 3 seconds  LEFT: Preulcerative hyperkeratotic lesion noted along the hallux plantarly.  On the sulcus of the toe plantarly is a transverse skin fissure with granulation tissue present in the wound bed.  There is no probing, undermining or tunneling.  No exposed bone or tendon.  No edema, erythema or signs of infection. No pain with calf compression, swelling, warmth, erythema bilaterally  Assessment: Left hallux pre-ulcerative callus; skin fissure  Plan: -All treatment options discussed with the patient including all alternatives, risks, complications -Sharply debrided the hyperkeratotic tissue any complications or bleeding from the original ulceration.  This appears to have healed. -Sharply debrided the skin fissure down to healthy, granular tissue on the plantar aspect the toe.  Recommended continue with daily dressing changes but also she can use a small amount of Vaseline once the area is healed to help with the difference looking back open again. -Monitor for any clinical signs or symptoms of infection and directed to call the office immediately should any occur or go to the ER.  Return in about 4 weeks (around 07/15/2023) for skin fissure.  Vivi Barrack DPM

## 2023-07-11 ENCOUNTER — Encounter (HOSPITAL_BASED_OUTPATIENT_CLINIC_OR_DEPARTMENT_OTHER): Payer: Self-pay | Admitting: Pulmonary Disease

## 2023-07-11 ENCOUNTER — Ambulatory Visit (HOSPITAL_BASED_OUTPATIENT_CLINIC_OR_DEPARTMENT_OTHER): Payer: Medicare Other | Admitting: Pulmonary Disease

## 2023-07-11 VITALS — BP 108/60 | HR 82 | Ht 66.0 in | Wt 292.6 lb

## 2023-07-11 DIAGNOSIS — J986 Disorders of diaphragm: Secondary | ICD-10-CM

## 2023-07-11 DIAGNOSIS — J9611 Chronic respiratory failure with hypoxia: Secondary | ICD-10-CM | POA: Diagnosis not present

## 2023-07-11 DIAGNOSIS — J4530 Mild persistent asthma, uncomplicated: Secondary | ICD-10-CM

## 2023-07-11 DIAGNOSIS — G4733 Obstructive sleep apnea (adult) (pediatric): Secondary | ICD-10-CM

## 2023-07-11 MED ORDER — AEROCHAMBER MV MISC: 0 refills | Status: AC

## 2023-07-11 NOTE — Patient Instructions (Signed)
Spacer  Change BiPAP settings.  Ambulatory saturation to qualify for oxygen

## 2023-07-11 NOTE — Assessment & Plan Note (Signed)
She would like to lower pressures on BiPAP. Lower EPAP minimum 6 cm and lower IPAP maximum to 18. Left abdominal was reviewed which shows acceptable compliance on average about 5 hours with a few missed nights, minimal leak, good control of events on average settings of 13/9 cm on auto BiPAP. Weight loss encouraged, compliance with goal of at least 4-6 hrs every night is the expectation. Advised against medications with sedative side effects Cautioned against driving when sleepy - understanding that sleepiness will vary on a day to day basis

## 2023-07-11 NOTE — Assessment & Plan Note (Addendum)
Ambulatory saturation today to check if she qualifies for oxygen She has completed pulmonary rehab and I encouraged her to continue maintenance program at home or at the [facility]

## 2023-07-11 NOTE — Progress Notes (Signed)
   Subjective:    Patient ID: [name], female    DOB: 1966-11-11, 57 y.o.   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      PMH -CIDP as above, dysautonomia [name]) , orthostatic hypotension with syncope and multiple falls, proteinuria/CKD [name] ) , hypertension, thyroid cancer status post thyroidectomy , fibromyalgia , IDDM on insulin pump  Chief Complaint  Patient presents with   Chronic respiratory failure with hypoxia   She arrives with her husband [name] today She continues with dyspnea especially in the hot humid weather.  She states that her oxygen saturation continues to drop on uneven surfaces but stays okay on level ground.  In the past whenever we have checked her oxygen saturation, although low at baseline does not desaturate enough for [place] to provide her with oxygen. She continues on Breo which seems to help.  She would like a spacer to help with the albuterol. She completed cardiopulmonary rehab program with seem to help, she did walking and using bands which she is trying to continue at home but has difficulty during the summer getting to an indoor place. She is compliant with her BiPAP machine and feels like the mask gets stuck on her face.  She took away the ramp after last visit she would like the pressure to be lowered  She desaturated to 86% on walking and recovered with 2 L of oxygen   Significant tests/ events reviewed   PAP titration 08/14/22 biPAP 24/20 , CPAP was titrated to 18 cm but could not toelrate due to persistent hypoxia, RERAs & hypopneas    ONO on CPAP on 06/03/2022 showed patient spent 8 minutes with SPO2 less than 88%. SPO2 low 82% with average 91%.     6-MWT  04/16/2022, baseline SPO2 low was 97% on room air. After 2-minute post walk SPO2 was 96% on room air.    HST 01/2021 mild OSA, AHI 7/h 11/2020 Sniff test decreased excursion of the right hemidiaphragm  PFT 12/2020 moderate reversible obstruction, ratio 85, FEV1 57%, FVC 62%, significant bronchodilator response improved by 0.54 to 81%, TLC 80%, DLCO 108%   Chest x-ray 11/18/2020 shows low volumes elevated right hemidiaphragm. Chest x-ray 02/2015 also shows lower volumes and similar mild elevation of right hemidiaphragm  Review of Systems neg for any significant sore throat, dysphagia, itching, sneezing, nasal congestion or excess/ purulent secretions, fever, chills, sweats, unintended wt loss, pleuritic or exertional cp, hempoptysis, orthopnea pnd or change in chronic leg swelling. Also denies presyncope, palpitations, heartburn, abdominal pain, nausea, vomiting, diarrhea or change in bowel or urinary habits, dysuria,hematuria, rash, arthralgias, visual complaints, headache, numbness weakness or ataxia.      Objective:   Physical Exam   Gen. Pleasant, obese, in no distress ENT - no lesions, no post nasal drip Neck: No JVD, no thyromegaly, no carotid bruits Lungs: no use of accessory muscles, no dullness to percussion, decreased without rales or rhonchi  Cardiovascular: Rhythm regular, heart sounds  normal, no murmurs or gallops, no peripheral edema Musculoskeletal: No deformities, no cyanosis or clubbing , no tremors, left leg bigger than right, chronic      Assessment & Plan:

## 2023-07-11 NOTE — Assessment & Plan Note (Signed)
We will provide her with spacer. Continue on Breo. Albuterol MDI can be used every 6 hours as needed

## 2023-07-11 NOTE — Assessment & Plan Note (Signed)
Feel that BiPAP would be better than CPAP in this setting

## 2023-07-16 ENCOUNTER — Other Ambulatory Visit: Payer: Self-pay | Admitting: Gastroenterology

## 2023-07-16 ENCOUNTER — Ambulatory Visit: Payer: Medicare Other | Admitting: Podiatry

## 2023-07-16 DIAGNOSIS — E1140 Type 2 diabetes mellitus with diabetic neuropathy, unspecified: Secondary | ICD-10-CM | POA: Diagnosis not present

## 2023-07-16 DIAGNOSIS — L84 Corns and callosities: Secondary | ICD-10-CM

## 2023-07-16 DIAGNOSIS — E1149 Type 2 diabetes mellitus with other diabetic neurological complication: Secondary | ICD-10-CM

## 2023-07-16 NOTE — Progress Notes (Signed)
Subjective: No chief complaint on file.   57 year old female presents the office today for follow-up evaluation of a wound on her left foot, skin fissure.  States that she is doing well.  She has not seen drainage of pus or any swelling or redness.  She keeps moisturizer on this at night.  No further skin breakdown that she reports.  No fevers or chills.  No other concerns today.   Objective: AAO x3, NAD DP/PT pulses palpable bilaterally, CRT less than 3 seconds  LEFT: Preulcerative hyperkeratotic lesion noted along the hallux plantarly and there is a new callus on the plantar aspect of the right hallux. There is no underlying ulceration, drainage, or signs of infection.  No other serrations noted today. No pain with calf compression, swelling, warmth, erythema bilaterally  Assessment: Left hallux pre-ulcerative callus; skin fissure  Plan: -All treatment options discussed with the patient including all alternatives, risks, complications -Sharply debrided the hyperkeratotic tissue  x 2 without any complications or bleeding from the original ulceration.  This appears to have healed. -Continue moisturizer daily, offloading.  -Monitor for any clinical signs or symptoms of infection and directed to call the office immediately should any occur or go to the ER.  Return in about 2 months (around 09/15/2023) for pre-ulcerative callus.  Vivi Barrack DPM

## 2023-07-24 ENCOUNTER — Telehealth: Payer: Self-pay | Admitting: Pulmonary Disease

## 2023-07-24 NOTE — Telephone Encounter (Signed)
Patient states needs walk test with POC. Patient phone number is [phone].

## 2023-07-31 NOTE — Telephone Encounter (Signed)
Amb Walk available in chart from 07/11/23: Office Visit 07/11/2023 [facility] Flowsheet Data (all recorded)  Ambulatory Pulse Oximetry   Row Name 07/11/23 0901      Resting  Supplemental oxygen during test? Yes  dropped to 86 on lap 3 and put her on 2L of oxygen to hold at 94 for lap lap 4 and lap 5      O2 Flow Rate (L/min) 2 L/min      FiO2 (%) 94 %      Resting Heart Rate 94      Resting Sp02 95      Lap 1 (250 feet)  HR 110      02 Sat 90      Lap 2 (250 feet)  HR 116      02 Sat 93      Lap 3 (250 feet)  HR 86      02 Sat 103      Tech Comments: dropped to 86 on lap 3 and put her on 2L of oxygen to hold at 94 for lap lap 4 and lap 5       [facility] will complete POC walk to determine if patient qualifies.

## 2023-08-19 ENCOUNTER — Other Ambulatory Visit: Payer: Self-pay | Admitting: Podiatry

## 2023-08-28 ENCOUNTER — Other Ambulatory Visit: Payer: Self-pay | Admitting: Gastroenterology

## 2023-09-16 ENCOUNTER — Encounter: Payer: Self-pay | Admitting: Podiatry

## 2023-09-16 ENCOUNTER — Ambulatory Visit: Payer: Medicare Other | Admitting: Podiatry

## 2023-09-16 VITALS — HR 93 | Resp 18 | Ht 66.0 in | Wt 292.0 lb

## 2023-09-16 DIAGNOSIS — L84 Corns and callosities: Secondary | ICD-10-CM

## 2023-09-16 DIAGNOSIS — M79675 Pain in left toe(s): Secondary | ICD-10-CM

## 2023-09-16 DIAGNOSIS — B351 Tinea unguium: Secondary | ICD-10-CM

## 2023-09-16 DIAGNOSIS — E1149 Type 2 diabetes mellitus with other diabetic neurological complication: Secondary | ICD-10-CM

## 2023-09-16 DIAGNOSIS — M79674 Pain in right toe(s): Secondary | ICD-10-CM

## 2023-09-16 NOTE — Progress Notes (Signed)
Subjective: Chief Complaint  Patient presents with   Callouses    RM11: pre-ulcerative callus      57 year old female presents the office today for follow-up evaluation of a wound on her left foot, skin fissure.  States that the callus just builds up as she denies any open sores or any drainage or swelling or redness.  Also she is concerned her left third toenail is to have some blood around the nail and is getting thick and long.  She does get tenderness to this toenail.  No swelling redness or injury to the other toenails.  No open lesions otherwise.   Objective: AAO x3, NAD DP/PT pulses palpable bilaterally, CRT less than 3 seconds  LEFT: Preulcerative hyperkeratotic lesion noted along the hallux plantarly and there is a new callus on the plantar aspect of the right hallux. There is no underlying ulceration, drainage, or signs of infection.  Nails are hypertrophic, dystrophic, brittle, discolored, elongated 10. No surrounding redness or drainage.  There is some dried blood present along the left second toe and the nail is long.  No edema, erythema or signs of infection.  There is also a preulcerative area noted on the right second toe that is new breakdown No other serrations noted today. No pain with calf compression, swelling, warmth, erythema bilaterally  Assessment: Left hallux pre-ulcerative callus; skin fissure  Plan: -All treatment options discussed with the patient including all alternatives, risks, complications -Sharply debrided the hyperkeratotic tissue  x 1 without any complications or bleeding from the original ulceration.  This appears to have healed. -Sharply debrided the nails x 8 without complications or bleeding.  -Continue moisturizer daily, offloading.  -Monitor for any clinical signs or symptoms of infection and directed to call the office immediately should any occur or go to the ER.  Return in about 2 months (around 11/18/2023) for routine foot care.  Vivi Barrack DPM

## 2023-10-03 ENCOUNTER — Other Ambulatory Visit: Payer: Self-pay | Admitting: Internal Medicine

## 2023-10-04 ENCOUNTER — Other Ambulatory Visit: Payer: Self-pay | Admitting: Podiatry

## 2023-10-11 ENCOUNTER — Encounter: Payer: Self-pay | Admitting: Internal Medicine

## 2023-10-11 ENCOUNTER — Ambulatory Visit: Payer: Medicare Other | Attending: Internal Medicine | Admitting: Internal Medicine

## 2023-10-11 VITALS — BP 117/72 | HR 95 | Ht 66.0 in | Wt 290.6 lb

## 2023-10-11 DIAGNOSIS — R55 Syncope and collapse: Secondary | ICD-10-CM | POA: Diagnosis not present

## 2023-10-11 DIAGNOSIS — I951 Orthostatic hypotension: Secondary | ICD-10-CM

## 2023-10-11 MED ORDER — LISINOPRIL 10 MG PO TABS: 5.0000 mg | ORAL_TABLET | Freq: Every day | ORAL | Status: DC

## 2023-10-11 MED ORDER — BISOPROLOL FUMARATE 5 MG PO TABS: 2.5000 mg | ORAL_TABLET | Freq: Every day | ORAL | Status: DC

## 2023-10-11 NOTE — Patient Instructions (Signed)
Medication Instructions:  Your physician has recommended you make the following change in your medication:   ** Decrease Lisinopril 10mg  to 1/2 tablet (5mg ) by mouth daily.  ** Decrease Bisoprolol 5mg  to 1/2 (2.5mg ) tablet by mouth daily.  *If you need a refill on your cardiac medications before your next appointment, please call your pharmacy*   Lab Work: None ordered.  If you have labs (blood work) drawn today and your tests are completely normal, you will receive your results only by: MyChart Message (if you have MyChart) OR A paper copy in the mail If you have any lab test that is abnormal or we need to change your treatment, we will call you to review the results.   Testing/Procedures: None ordered.    Follow-Up: At [facility], you and your health needs are our priority.  As part of our continuing mission to provide you with exceptional heart care, we have created designated Provider Care Teams.  These Care Teams include your primary Cardiologist (physician) and Advanced Practice Providers (APPs -  Physician Assistants and Nurse Practitioners) who all work together to provide you with the care you need, when you need it.  We recommend signing up for the patient portal called "MyChart".  Sign up information is provided on this After Visit Summary.  MyChart is used to connect with patients for Virtual Visits (Telemedicine).  Patients are able to view lab/test results, encounter notes, upcoming appointments, etc.  Non-urgent messages can be sent to your provider as well.   To learn more about what you can do with MyChart, go to [email or web address].    Your next appointment:   6 months with Dr Graciela Husbands

## 2023-10-11 NOTE — Progress Notes (Unsigned)
Patient Care Team: Adrian Prince, MD as PCP - General (Endocrinology) Duke Salvia, MD as PCP - Electrophysiology (Cardiology) Glendale Chard, DO as Consulting Physician (Neurology)   HPI  [name] is a 57 y.o. female Seen in follow-up for recurrent syncope with hypertension, documented orthostatic hypotension in the context of diabetes with likely some degree of neuropathy;  also diagnosed with CIDP    Nephrology has been managing hypertension and notably, systolic hypertension has precluded sodium loading for volume support  Strategies employed have included 1-abdominal binder associate with skin erosion, but more recently has tolerated both abdominal and lower extremity compression 2-fluids 3-beta-blockers improving exercise tolerance  She has been much better with the clonidine and the recent introduction of oxygen therapy both of which have been associated with significant decrease in tachypalpitations.  Continues with lightheadedness but blood pressures and heart rates have been more stable and the episodes are less frequent.       DATE TEST EF%   02/18 Echo  65-70 %   02/22 Echo  60-65 %         Date   Cr              K Hgb   4/19 0.82 4.1         14.1(10/19)   5/21   15.7   11/23 0.83 4.5    2/24 1.0 3.8 14.5      Past Medical History:  Diagnosis Date   Anginal pain (HCC)    chest pain  heart cath done   Anxiety    Cancer [facility])    thyroid cancer   Chronic bronchitis (HCC)    "get it q yr"   Chronic inflammatory demyelinating polyneuropathy (HCC)    Chronic kidney disease    Proteinuria   Dyspnea    sob with exertion   Early cataract    right   Fibromyalgia    GERD (gastroesophageal reflux disease)    Heart murmur    Hepatic adenoma    High cholesterol    History of gout    History of hiatal hernia    History of kidney stones    Hypertension    Hypothyroidism    IBS (irritable bowel syndrome)    Iron deficiency anemia     "comes and goes"   Migraine    "@ least once/month" (02/18/2015)   Obesity    PONV (postoperative nausea and vomiting)    patch and anti nausea meds work well   Proteinuria    Spinal headache    Syncope    Tachycardia    Type II diabetes mellitus (HCC)    pt has an insulin pump   Wears glasses     Past Surgical History:  Procedure Laterality Date   ABDOMINAL HYSTERECTOMY  ~2012   "lap"   ANKLE RECONSTRUCTION Left 03/29/2016   Procedure: LEFT ANKLE LATERAL LIGAMENT RECONSTRUCTION AND PERONEAL TENDON REPAIR OR TENOLYSIS;  Surgeon: Toni Arthurs, MD;  Location: [facility] OR;  Service: Orthopedics;  Laterality: Left;   BACK SURGERY     CARDIAC CATHETERIZATION  04/2009   "clean"   COLONOSCOPY  01/12/2015   Colonic polyps status post polypectomy, small internal hemorrhoids. Otherwise normal. Bx: mild chronic gastritis.   CYSTOSCOPY WITH URETEROSCOPY, STONE BASKETRY AND STENT PLACEMENT  ~ 2006   ESOPHAGOGASTRODUODENOSCOPY  07/21/2015   Esophageal stricture status post esophageal dilatation. Mild gastritis.   insulin pump     U 500  IR IMAGING GUIDED PORT INSERTION  09/22/2018   IRRIGATION AND DEBRIDEMENT KNEE Left 04/03/2018   Procedure: Arthroscopic versus open irrigation and debridement  left knee;  Surgeon: Yolonda Kida, MD;  Location: [facility] OR;  Service: Orthopedics;  Laterality: Left;   LAPAROSCOPIC CHOLECYSTECTOMY  05/2004   LUMBAR DISC SURGERY  08/2011   SURAL NERVE BX Left 09/30/2017   Procedure: LEFT SURAL NERVE AND MUSCLE BIOPSY;  Surgeon: Tressie Stalker, MD;  Location: [facility] OR;  Service: Neurosurgery;  Laterality: Left;  LEFT SURAL NERVE AND MUSCLE BIOPSY   THYROIDECTOMY N/A 02/18/2015   Procedure: TOTAL THYROIDECTOMY;  Surgeon: Darnell Level, MD;  Location: [facility] OR;  Service: General;  Laterality: N/A;   TOTAL THYROIDECTOMY  02/18/2015    Current Outpatient Medications  Medication Sig Dispense Refill   acetaminophen (TYLENOL) 500 MG tablet Take 1,000 mg by mouth every 6 (six)  hours as needed for moderate pain or headache.      albuterol (VENTOLIN HFA) 108 (90 Base) MCG/ACT inhaler inhale 2 puffs DAILY (Patient taking differently: Inhale 2 puffs into the lungs every 6 (six) hours as needed for wheezing or shortness of breath. inhale 2 puffs DAILY) 6.7 g 11   ALPRAZolam (XANAX) 0.5 MG tablet Take 1 tablet by mouth every 6 (six) hours as needed.     ammonium lactate (LAC-HYDRIN) 12 % lotion APPLY TO ARMS   HANDS 2 TIMES A DAY ESPECIALLY AFTER BATH     APPLE CIDER VINEGAR PO Take 1 each by mouth daily. One gummy once a day     ARTIFICIAL TEARS 0.2-0.2-1 % SOLN See admin instructions.     ASPIRIN 81 PO 81 tablets every day by oral route.     atorvastatin (LIPITOR) 20 MG tablet Take 1 tablet by mouth daily.     Azelastine HCl 137 MCG/SPRAY SOLN daily.     b complex vitamins tablet Take 1 tablet by mouth daily.     Bayer Microlet Lancets lancets 4 each by Other route 4 (four) times daily.     Bepotastine Besilate 1.5 % SOLN 1 drop 2 (two) times daily.     bisoprolol (ZEBETA) 5 MG tablet Take 1 tablet (5 mg total) by mouth daily. 30 tablet 0   Black Pepper-Turmeric (TURMERIC CURCUMIN) 04-999 MG CAPS See admin instructions. At bedtime     cetirizine (ZYRTEC) 10 MG tablet 1 tablet as needed     clobetasol cream (TEMOVATE) 0.05 % Apply 1 application topically 2 (two) times daily as needed.      cloNIDine (CATAPRES - DOSED IN MG/24 HR) 0.1 mg/24hr patch Place 1 patch (0.1 mg total) onto the skin once a week. Start in 2 weeks 4 patch 12   Continuous Blood Gluc Sensor (FREESTYLE LIBRE 3 SENSOR) MISC USE AS DIRECTED TO CHECK BLOOD SUGARS     D3-50 50000 units capsule Take 50,000 Units by mouth every Saturday.  4   dapagliflozin propanediol (FARXIGA) 5 MG TABS tablet 1 tablet     diclofenac Sodium (VOLTAREN) 1 % GEL APPLY 2 GRAMS TO AFFECTED AREA OF FOOT 2-4 TIMES DAILY 100 g 0   dicyclomine (BENTYL) 10 MG capsule Take 1 capsule (10 mg total) by mouth every 8 (eight) hours as needed  for spasms. 120 capsule 11   diphenhydrAMINE (BENADRYL) 50 MG/ML injection      DULoxetine (CYMBALTA) 60 MG capsule Take 60 mg by mouth daily.     fluticasone (FLONASE) 50 MCG/ACT nasal spray Place 1 spray into both nostrils at  bedtime.     fluticasone furoate-vilanterol (BREO ELLIPTA) 200-25 MCG/ACT AEPB Inhale 1 puff into the lungs daily. 60 each 2   furosemide (LASIX) 80 MG tablet Take 80 mg by mouth daily.     gabapentin (NEURONTIN) 300 MG capsule Take 2 capsules (600 mg total) by mouth 3 (three) times daily. 540 capsule 1   GAMUNEX-C 20 GM/200ML SOLN      glucose blood (CONTOUR NEXT TEST) test strip 4 each by Other route 4 (four) times daily.     HYDROcodone-acetaminophen (NORCO) 7.5-325 MG tablet Take 1-2 tablets by mouth every 6 (six) hours as needed for moderate pain (for pain.). 40 tablet 0   hyoscyamine (LEVSIN SL) 0.125 MG SL tablet PLACE 2 TABLETS UNDER TONGUE EVERY 4 HOURS AS NEEDED WITH MEALS. 120 tablet 6   Insulin Human (INSULIN PUMP) SOLN Inject into the skin.     insulin regular human CONCENTRATED (HUMULIN R) 500 UNIT/ML injection Inject 2 Units into the skin See admin instructions. 2.0 units /hour 0500-2359, 1.95units 0000-0459 with carb ratio 1:50 for meal time bolus     Ivermectin 1 % CREA Apply topically.     ketoconazole (NIZORAL) 2 % cream Apply 1 application topically 2 (two) times daily as needed (for toes).      levothyroxine (SYNTHROID) 137 MCG tablet Take 2 tablets by mouth daily.     lisinopril (ZESTRIL) 10 MG tablet Take 1 tablet (10 mg total) by mouth daily. 90 tablet 3   metFORMIN (GLUCOPHAGE) 1000 MG tablet 1 tablet with a meal     methocarbamol (ROBAXIN) 500 MG tablet Take 500 mg by mouth 3 (three) times daily as needed.     metroNIDAZOLE (METROGEL) 1 % gel Apply topically 2 (two) times daily.     Multiple Vitamin (MULTIVITAMIN) tablet Take 1 tablet by mouth at bedtime.      mupirocin ointment (BACTROBAN) 2 % APPLY OINTMENT TOPICALLY TO AFFECTED AREA ON TOE  TWICE DAILY 22 g 0   NURTEC 75 MG TBDP Take 1 tablet by mouth daily.     ondansetron (ZOFRAN-ODT) 8 MG disintegrating tablet TAKE ONE TABLET BY MOUTH EVERY 8 HOURS AS NEEDED 30 tablet 11   OZEMPIC, 1 MG/DOSE, 4 MG/3ML SOPN Inject into the skin.     pantoprazole (PROTONIX) 40 MG tablet Take 1 tablet (40 mg total) by mouth 2 (two) times daily. 180 tablet 4   PATADAY 0.1 % ophthalmic solution 1 drop into affected eye Ophthalmic Twice a day for 30 days     polyethylene glycol (MIRALAX / GLYCOLAX) 17 g packet Take 17 g by mouth daily. 90 packet 3   PROCTOSOL HC 2.5 % rectal cream Apply 1 application topically 2 (two) times daily as needed for hemorrhoids.   2   sodium chloride 0.9 % infusion Inject into the vein.     Spacer/Aero-Holding Chambers (AEROCHAMBER MV) inhaler Use as instructed 1 each 0   SSD 1 % cream Apply 1 application topically daily. 50 g 0   Urea (UREA NAIL) 45 % GEL Apply 1 Application topically daily. 28 mL 1   VRAYLAR 1.5 MG capsule Take 1.5 mg by mouth daily.     No current facility-administered medications for this visit.    Allergies  Allergen Reactions   Latex Itching, Other (See Comments) and Rash    Blisters with prolonged contact   Penicillins Anaphylaxis, Shortness Of Breath, Swelling, Palpitations and Other (See Comments)    Has patient had a PCN reaction causing immediate rash,  facial/tongue/throat swelling, SOB or lightheadedness with hypotension: Yes Has patient had a PCN reaction causing severe rash involving mucus membranes or skin necrosis: No Has patient had a PCN reaction that required hospitalization No Has patient had a PCN reaction occurring within the last 10 years: No If all of the above answers are "NO", then may proceed with Cephalosporin use.    Clarithromycin Nausea And Vomiting and Other (See Comments)    Any antibiotic (doxyclycine, etc)   Adhesive [Tape] Itching and Other (See Comments)    Redness   Moxifloxacin Nausea And Vomiting and Other  (See Comments)    Needs Zofran or Phenergan   Other Other (See Comments)    All antibiotics makes patient sick   Review of Systems negative except from HPI and PMH  Physical Exam:BP 117/72 (Patient Position: Standing)   Pulse 95   Ht 5\' 6"  (1.676 m)   Wt 290 lb 9.6 oz (131.8 kg)   SpO2 95%   BMI 46.90 kg/m  Well developed and Morbidly obese  in no acute distress HENT normal Neck supple with JVP-  flat   Clear Regular rate and rhythm, no murmurs or gallops Abd-soft with active BS No Clubbing cyanosis edema Skin-warm and dry A & Oriented  Grossly normal sensory and motor function  ECG sinus at 87 Interval 17/09/38   Assessment and  Plan:   Orthostatic hypotension supine systolic hypertension   Syncope   Diabetes   Morbid obesity  CIDP ( chronic inflammatory demyelinating polyneuropathy)  Reactive airways disease   Obstructive sleep apnea   Dyspnea is better with the oxygen therapy.  Less palpitations.  With her now relatively lower blood pressures, we will continue her on the clonidine which seems to have been very helpful as a central sympatholytic, we will decrease her bisoprolol from 5--2.5, and her lisinopril from 10--5, maintaining it because of renal protection in the setting of diabetes and proteinuria.         Marland Kitchen

## 2023-11-14 ENCOUNTER — Other Ambulatory Visit: Payer: Self-pay | Admitting: Internal Medicine

## 2023-11-14 ENCOUNTER — Other Ambulatory Visit: Payer: Self-pay | Admitting: Pulmonary Disease

## 2023-11-15 ENCOUNTER — Encounter: Payer: Self-pay | Admitting: Podiatry

## 2023-11-15 ENCOUNTER — Other Ambulatory Visit: Payer: Self-pay

## 2023-11-15 ENCOUNTER — Ambulatory Visit: Payer: Medicare Other | Admitting: Podiatry

## 2023-11-15 DIAGNOSIS — R234 Changes in skin texture: Secondary | ICD-10-CM

## 2023-11-15 DIAGNOSIS — L84 Corns and callosities: Secondary | ICD-10-CM

## 2023-11-15 DIAGNOSIS — M7752 Other enthesopathy of left foot: Secondary | ICD-10-CM

## 2023-11-15 MED ORDER — FLUTICASONE FUROATE-VILANTEROL 200-25 MCG/ACT IN AEPB: 1.0000 | INHALATION_SPRAY | Freq: Every day | RESPIRATORY_TRACT | 6 refills | Status: DC

## 2023-11-15 MED ORDER — MUPIROCIN 2 % EX OINT: 1.0000 | TOPICAL_OINTMENT | Freq: Two times a day (BID) | CUTANEOUS | 2 refills | Status: AC

## 2023-11-15 MED ORDER — DICLOFENAC SODIUM 3 % EX GEL: 1.0000 | Freq: Two times a day (BID) | CUTANEOUS | 1 refills | Status: DC | PRN

## 2023-11-15 NOTE — Telephone Encounter (Signed)
Breo 200 sent to pharmacy from last office visit note.

## 2023-11-18 ENCOUNTER — Telehealth: Payer: Self-pay

## 2023-11-18 NOTE — Telephone Encounter (Signed)
PA request received from covermymeds for diclofenac 3% gel. PA submitted through covermymeds and waiting on response.  AMYMICHELE Grindstaff  (Key: [id number])

## 2023-11-18 NOTE — Telephone Encounter (Signed)
I received PA request for diclofenac 3% gel. I need a diagnosis to complete PA.

## 2023-11-18 NOTE — Progress Notes (Signed)
Subjective: Chief Complaint  Patient presents with   Routine Post Op    Patient states that her left hallux has callouses and it split open, patient states it has been months like this , no medication for pain, patient doesn't feel it .    57 year old female presents the office today for follow-up evaluation of a wound on her left foot, skin fissure.  She states the areas opened back up and the skin fissure.  Finally drainage or pus.  She been keeping the protective moisturizers on the area.  She has been using Voltaren on her other toes made to her and she is asking for higher dose if possible.  No new open lesions identified otherwise.  No fevers or chills.   Objective: AAO x3, NAD DP/PT pulses palpable bilaterally, CRT less than 3 seconds  LEFT: Hyperkeratotic lesion noted upon after the hallux and long.  More proximally there is a transverse skin fissure.  There is no probing to bone, and or tunneling there is no gross tendon.  There is no surrounding erythema, ascending cellulitis.  No fluctuation or crepitation.  No malodor.  She been ongoing chronic pains in the other toes but there is no specific area pinpoint tenderness. No pain with calf compression, swelling, warmth, erythema bilaterally  Assessment: Left hallux pre-ulcerative callus; skin fissure  Plan: -All treatment options discussed with the patient including all alternatives, risks, complications -Sharply debrided the hyperkeratotic tissue  x 1 without any complications or bleeding from the original ulceration.  Continue antibiotic ointment on the area of the skin fissure.  Discussed moisturizer on the callused area. -Supine wound approximately discussed wearing a sneaker to get better support.  Also we can try to modify this when she brings them in to help take additional pressure of the toe. -Prescribed Voltaren for the other toes as needed. -Continue moisturizer daily, offloading.  -Monitor for any clinical signs or  symptoms of infection and directed to call the office immediately should any occur or go to the ER.  Vivi Barrack DPM

## 2023-11-19 NOTE — Telephone Encounter (Signed)
Evie- can you help with this? Thanks!

## 2023-11-20 NOTE — Telephone Encounter (Addendum)
PA was denied. Patient initiated an appeal. Waiting on reply about appeal. May take up to 7 days.

## 2023-11-28 NOTE — Telephone Encounter (Signed)
I called and spoke with patient and informed her of the upheld denial and recommendations. She stated that capsaicin blisters her skin and biofreeze doesn't work (just like putting on regular lotion).

## 2023-11-28 NOTE — Telephone Encounter (Signed)
Spoke with [employer] representative and denial was upheld. It was upheld because medication not prescribed in accordance with FDA labeled use or use accepted by Medicare approved drug compendia. Decision was made on 11/20/23. They sent denial letter to member on 11/20/23.

## 2023-12-06 NOTE — Telephone Encounter (Signed)
Discussed with patient and she verbalized her understanding. She states she will try lidocaine patches.

## 2023-12-08 ENCOUNTER — Other Ambulatory Visit: Payer: Self-pay | Admitting: Internal Medicine

## 2023-12-23 ENCOUNTER — Ambulatory Visit: Payer: Medicare Other | Admitting: Podiatry

## 2023-12-24 ENCOUNTER — Ambulatory Visit (INDEPENDENT_AMBULATORY_CARE_PROVIDER_SITE_OTHER): Payer: Medicare Other

## 2023-12-24 ENCOUNTER — Ambulatory Visit: Payer: Medicare Other | Admitting: Podiatry

## 2023-12-24 DIAGNOSIS — L84 Corns and callosities: Secondary | ICD-10-CM

## 2023-12-24 DIAGNOSIS — E1149 Type 2 diabetes mellitus with other diabetic neurological complication: Secondary | ICD-10-CM

## 2023-12-24 DIAGNOSIS — M2062 Acquired deformities of toe(s), unspecified, left foot: Secondary | ICD-10-CM

## 2023-12-24 DIAGNOSIS — M778 Other enthesopathies, not elsewhere classified: Secondary | ICD-10-CM

## 2023-12-25 NOTE — Progress Notes (Signed)
 Subjective: Chief Complaint  Patient presents with   Foot Ulcer    RM#13 Follow up on left foot ulcer patient states has no other concerns today.    58 year old female presents the office today for follow-up evaluation of a wound on her left foot, skin fissure.  States that she been doing well.  She denies any drainage or pus or increasing swelling redness.  She does not have any fevers or chills.  She has no other concerns today.    Objective: AAO x3, NAD DP/PT pulses palpable bilaterally, CRT less than 3 seconds  LEFT: Hyperkeratotic lesion, skin fissure noted on the plantar aspect of toe on the sulcus.  Upon debridement there is no definitive skin breakdown today but is a deep fissure but there is no granulation tissue or open lesion.  There is no probing, undermining or tunneling.  There is no surrounding erythema, ascending size.  No fluctuation or crepitation.  No malodor.  No obvious signs of infection noted today. No pain with calf compression, swelling, warmth, erythema bilaterally  Assessment: Left hallux pre-ulcerative callus; skin fissure  Plan: -All treatment options discussed with the patient including all alternatives, risks, complications -X-rays obtained reviewed.  Multiple views were obtained.  There is no cortical destruction suggest osteomyelitis.  No soft tissue edema.  No evidence of acute fracture. -I started debrided hyperkeratotic lesion with any complications or bleeding.  Recommended small Vaseline and moisturizer daily.  Should it open after bleed recommend: Antibiotic ointment with Silvadene .  Continue offloading. -Monitor for any clinical signs or symptoms of infection and directed to call the office immediately should any occur or go to the ER.  Return in about 6 weeks (around 02/04/2024).  Donnice JONELLE Fees DPM

## 2024-01-07 ENCOUNTER — Ambulatory Visit (HOSPITAL_BASED_OUTPATIENT_CLINIC_OR_DEPARTMENT_OTHER): Payer: Medicare Other | Admitting: Pulmonary Disease

## 2024-01-07 ENCOUNTER — Encounter (HOSPITAL_BASED_OUTPATIENT_CLINIC_OR_DEPARTMENT_OTHER): Payer: Self-pay | Admitting: Pulmonary Disease

## 2024-01-07 VITALS — BP 112/70 | HR 72 | Resp 16 | Ht 66.0 in | Wt 287.0 lb

## 2024-01-07 DIAGNOSIS — G6181 Chronic inflammatory demyelinating polyneuritis: Secondary | ICD-10-CM

## 2024-01-07 DIAGNOSIS — J986 Disorders of diaphragm: Secondary | ICD-10-CM

## 2024-01-07 DIAGNOSIS — R0609 Other forms of dyspnea: Secondary | ICD-10-CM | POA: Diagnosis not present

## 2024-01-07 DIAGNOSIS — G4733 Obstructive sleep apnea (adult) (pediatric): Secondary | ICD-10-CM

## 2024-01-07 NOTE — Patient Instructions (Signed)
Refills on Breo as needed  biPAP supplies as needed

## 2024-01-07 NOTE — Progress Notes (Signed)
Subjective:    Patient ID: [name], female    DOB: 17-Jun-1966, 58 y.o.   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      PMH -CIDP as above, dysautonomia Graciela Husbands) , orthostatic hypotension with syncope and multiple falls, proteinuria/CKD Signe Colt ) , hypertension, thyroid cancer status post thyroidectomy , fibromyalgia , IDDM on insulin pump   Discussed the use of AI scribe software for clinical note transcription with the patient, who gave verbal consent to proceed.  History of Present Illness   The patient, with a history of CIDP, shortness of breath, right diaphragm weakness, sleep apnea on BiPAP, and brain surgery for tremors, presents with increased shortness of breath and wheezing. She attributes these symptoms to recent weight gain and is currently on a journey to lose weight again. She reports a change in her speech due to adjustments made to her tremor control device, which she prefers over having the tremors. She is not currently using any oxygen therapy but does use a BiPAP machine at night. She reports that her oxygen saturation levels remain above 94% with pursed lip breathing and that cooler weather has improved her breathing. However, she still experiences shortness of breath and tachycardia with exertion, which improves with the use of oxygen. She is also on Breo for her breathing and only uses albuterol when absolutely necessary due to its effect on her tachycardia. She reports some issues with her BiPAP mask causing skin irritation, but has found some relief with a cream she ordered. She is also considering other mask options to reduce skin contact. She reports some weight loss, which she hopes  will improve her breathing and oxygen levels.      Last office visit we reduced pressures on BiPAP. Download now shows excellent control of events on average settings 13/9 with mild leak auto BiPAP settings, average usage is close to 6 hours.  She is very compliant and BiPAP is only helped improve her daytime somnolence and fatigue  On ambulation oxygen saturation stayed at 92% and dropped transiently to 89   Significant tests/ events reviewed  07/2023 OV >>She desaturated to 86% on walking and recovered with 2 L of oxygen   PAP titration 08/14/22 biPAP 24/20 , CPAP was titrated to 18 cm but could not toelrate due to persistent hypoxia, RERAs & hypopneas    ONO on CPAP on 06/03/2022 showed patient spent 8 minutes with SPO2 less than 88%. SPO2 low 82% with average 91%.     6-MWT 04/16/2022, baseline SPO2 low was 97% on room air. After 2-minute post walk SPO2 was 96% on room air.    HST 01/2021 mild OSA, AHI 7/h 11/2020 Sniff test decreased excursion of the right hemidiaphragm  PFT 12/2020 moderate reversible obstruction, ratio 85, FEV1 57%, FVC 62%, significant bronchodilator response improved by 0.54 to 81%, TLC 80%, DLCO 108%   Chest x-ray 11/18/2020 shows low volumes elevated right hemidiaphragm. Chest x-ray 02/2015 also shows lower volumes and similar mild elevation of right hemidiaphragm  Review of Systems neg for any significant sore throat, dysphagia, itching, sneezing, nasal congestion or excess/ purulent secretions, fever, chills, sweats, unintended wt loss, pleuritic or exertional cp, hempoptysis,  orthopnea pnd or change in chronic leg swelling. Also denies presyncope, palpitations, heartburn, abdominal pain, nausea, vomiting, diarrhea or change in bowel or urinary habits, dysuria,hematuria, rash, arthralgias, visual complaints, headache, numbness weakness or ataxia.     Objective:   Physical Exam  Gen. Pleasant, obese, in no distress ENT - no lesions, no post nasal drip Neck: No  JVD, no thyromegaly, no carotid bruits Lungs: no use of accessory muscles, no dullness to percussion, decreased without rales or rhonchi  Cardiovascular: Rhythm regular, heart sounds  normal, no murmurs or gallops, no peripheral edema Musculoskeletal: No deformities, no cyanosis or clubbing , no tremors       Assessment & Plan:     Assessment and Plan    Right Diaphragm Weakness Contributing to respiratory issues, exacerbating dyspnea during exertion. - Encourage use of oxygen during exertion -bipap during sleep  Shortness of Breath Dyspnea, particularly with exertion. Managed with pursed lip breathing and supplemental oxygen. Oxygen saturation drops with exertion but recovers with rest and oxygen use. - Continue pursed lip breathing techniques - Use supplemental oxygen during exertion - Monitor oxygen saturation levels  Obstructive Sleep Apnea Managed with BiPAP at night. Improved sleep duration with current settings. Issues with mask fit and skin irritation. Discussed alternative mask options and use of CPAP cream or mask liners. - Continue BiPAP therapy with current settings - Order new headgear and supplies for BiPAP - Consider CPAP cream or mask liners - Discuss alternative mask options  Chronic Inflammatory Demyelinating Polyneuropathy (CIDP) Characterized by progressive weakness and impaired sensory function. Managed with infusions, causing fatigue. Speech difficulties due to deep brain stimulation adjustments for tremors. Prefers tremor control over speech clarity. - Continue current infusion therapy - Monitor for fatigue and adjust treatment as necessary   Weight Management Weight gain contributing to respiratory symptoms. Current weight 287 lbs, down from 309 lbs. Engaging in physical activity and considering chair Pilates or chair yoga. - Continue weight loss efforts - Encourage physical activity such as walking on an incline - Consider chair Pilates or chair yoga  exercises  General Health Maintenance Managing health well despite multiple chronic conditions. Proactive in seeking solutions and maintaining treatment regimens. - Renew Breo prescription - Ensure adequate supply of BiPAP equipment and accessories  Follow-up - Schedule follow-up appointment in one year -- Monitor for changes in oxygen qualification requirements.

## 2024-01-22 ENCOUNTER — Other Ambulatory Visit: Payer: Self-pay | Admitting: Pulmonary Disease

## 2024-01-28 ENCOUNTER — Other Ambulatory Visit: Payer: Self-pay | Admitting: Gastroenterology

## 2024-02-04 ENCOUNTER — Ambulatory Visit: Payer: Medicare Other | Admitting: Podiatry

## 2024-02-04 DIAGNOSIS — M79672 Pain in left foot: Secondary | ICD-10-CM

## 2024-02-04 DIAGNOSIS — M2062 Acquired deformities of toe(s), unspecified, left foot: Secondary | ICD-10-CM | POA: Diagnosis not present

## 2024-02-04 DIAGNOSIS — L97522 Non-pressure chronic ulcer of other part of left foot with fat layer exposed: Secondary | ICD-10-CM

## 2024-02-04 DIAGNOSIS — G8929 Other chronic pain: Secondary | ICD-10-CM

## 2024-02-04 MED ORDER — LIDOCAINE 5 % EX OINT: 1.0000 | TOPICAL_OINTMENT | CUTANEOUS | 0 refills | Status: DC | PRN

## 2024-02-04 NOTE — Progress Notes (Unsigned)
 Subjective: Chief Complaint  Patient presents with   Diabetic Ulcer    RM#11 Follow up care on left ulcer on big toe has a large split across the toe no drainage.    58 year old female presents the office today for follow-up evaluation of a wound on her left foot, skin fissure.  States that she been doing well.  She denies any drainage or pus or increasing swelling redness.  She does not have any fevers or chills.  She has no other concerns today.    Objective: AAO x3, NAD DP/PT pulses palpable bilaterally, CRT less than 3 seconds  LEFT: Hyperkeratotic lesion, skin fissure noted on the plantar aspect of toe on the sulcus.  Upon debridement there is no definitive skin breakdown today but is a deep fissure but there is no granulation tissue or open lesion.  There is no probing, undermining or tunneling.  There is no surrounding erythema, ascending size.  No fluctuation or crepitation.  No malodor.  No obvious signs of infection noted today. No pain with calf compression, swelling, warmth, erythema bilaterally  Assessment: Left hallux pre-ulcerative callus; skin fissure  Plan: -All treatment options discussed with the patient including all alternatives, risks, complications -sx shoe  Pain -lidocaine cream  -I started debrided hyperkeratotic lesion with any complications or bleeding.  Recommended small Vaseline and moisturizer daily.  Should it open after bleed recommend: Antibiotic ointment with Silvadene.  Continue offloading. -Monitor for any clinical signs or symptoms of infection and directed to call the office immediately should any occur or go to the ER.  Return in about 6 weeks (around 02/04/2024).  Vivi Barrack DPM

## 2024-02-07 ENCOUNTER — Telehealth: Payer: Self-pay

## 2024-02-07 NOTE — Telephone Encounter (Addendum)
 PA request for lidocaine 5% ointment denied.    I have submitted clinical note to see if an appeal will be approved

## 2024-02-17 NOTE — Telephone Encounter (Addendum)
 PA was still denied after sending additional clinical note.

## 2024-02-18 ENCOUNTER — Ambulatory Visit: Payer: Medicare Other | Admitting: Podiatry

## 2024-02-18 ENCOUNTER — Encounter (HOSPITAL_BASED_OUTPATIENT_CLINIC_OR_DEPARTMENT_OTHER): Payer: Self-pay | Admitting: Pulmonary Disease

## 2024-02-18 ENCOUNTER — Encounter: Payer: Self-pay | Admitting: Podiatry

## 2024-02-18 DIAGNOSIS — L97522 Non-pressure chronic ulcer of other part of left foot with fat layer exposed: Secondary | ICD-10-CM | POA: Diagnosis not present

## 2024-02-18 DIAGNOSIS — G4733 Obstructive sleep apnea (adult) (pediatric): Secondary | ICD-10-CM

## 2024-02-18 MED ORDER — HYDROGEL EX GEL: 1.0000 | Freq: Every day | CUTANEOUS | 1 refills | Status: DC | PRN

## 2024-02-18 NOTE — Telephone Encounter (Signed)
 Please advise if mask change is okay

## 2024-02-18 NOTE — Progress Notes (Unsigned)
 Subjective: Chief Complaint  Patient presents with   Foot Ulcer    RM#14 Follow up on left foot ulcer doing well today no signs of infection. Would like to discuss pain treatment having ins denial.      58 year old female presents the office today for follow-up evaluation of a wound on her left foot, skin fissure.  She is to keep Medihoney ointment on the area daily.  Denies any increasing swelling or redness.  No purulence.  Does not endorse any fevers or chills.  Objective: AAO x3, NAD DP/PT pulses palpable bilaterally, CRT less than 3 seconds  LEFT: Hyperkeratotic lesion, skin fissure noted on the plantar aspect of toe on the sulcus.  The wound measures somewhat smaller at 2 x 0.1 x 0.1 cm but with the central portion has granulation tissue present in it and the medial lateral aspects are more superficial hyperkeratotic tissue.  Prior to debridement it was mostly covered with callus and a small superficial wound was present.  There is no probing, undermining or tunneling.  No exposed bone or tendon.  No edema, erythema or signs of infection. No pain with calf compression, swelling, warmth, erythema bilaterally  Assessment: Left hallux ulceration  Plan: Left hallux ulceration -Medically necessary wound debridement was performed today.  Sharply debrided the ulceration remove nonviable, devitalized tissue utilizing #312 scalpel down toe healthy, granular tissue.  I was not able to measure the wound prior to debridement as it was covered with callus and post wound measurements are noted above.  There was no blood loss.  Cleaned the wound with saline.  No appointment was applied followed by dressing.  Recommend continue with daily dressing changes with hydrogel. -Continue surgical shoe for offloading. -Monitor for any clinical signs or symptoms of infection and directed to call the office immediately should any occur or go to the ER.   Return for 2-3 weeks for ulcer.  Vivi Barrack  DPM

## 2024-02-25 ENCOUNTER — Other Ambulatory Visit: Payer: Self-pay | Admitting: Gastroenterology

## 2024-03-10 ENCOUNTER — Ambulatory Visit: Admitting: Podiatry

## 2024-03-20 ENCOUNTER — Ambulatory Visit: Admitting: Podiatry

## 2024-03-20 DIAGNOSIS — R234 Changes in skin texture: Secondary | ICD-10-CM | POA: Diagnosis not present

## 2024-03-20 NOTE — Progress Notes (Signed)
 Subjective: Chief Complaint  Patient presents with   Foot Ulcer    RM#11 Left big toe ulcer no pain.      58 year old female presents the office today for follow-up evaluation of a wound on her left foot, skin fissure.  Said that she alternates Vaseline as well as hydrogel on the wound.  She denies any drainage or pus or increasing swelling or redness.  She does not report any fevers or chills.  She has no pain with this.  No other concerns.   Objective: AAO x3, NAD DP/PT pulses palpable bilaterally, CRT less than 3 seconds  LEFT: Hyperkeratotic lesion, skin fissure noted at the plantar aspect left hallux.  Looks at the superficial wound but wanted debrided all hyperkeratotic tissue the wound itself is actually healed with a callus gets quite thick.  There is no drainage or pus.  No edema, erythema or signs of infection.  There are no open lesions identified at this time. No pain with calf compression, swelling, warmth, erythema bilaterally  Assessment: Left hallux ulceration/skin fissure  Plan: Left hallux ulceration -I sharply debrided the thick hyperkeratotic tissue without any complications or bleeding today.  Initially looked like a open wound however once I debrided this there is no ulceration identified.  Continue with daily dressing use, offloading. -Monitor for any clinical signs or symptoms of infection and directed to call the office immediately should any occur or go to the ER.  Return in about 3 weeks (around 04/10/2024) for toe ulcer left.  Charity Conch DPM

## 2024-04-09 ENCOUNTER — Other Ambulatory Visit: Payer: Self-pay | Admitting: Gastroenterology

## 2024-04-10 ENCOUNTER — Ambulatory Visit: Admitting: Podiatry

## 2024-04-10 DIAGNOSIS — T148XXA Other injury of unspecified body region, initial encounter: Secondary | ICD-10-CM

## 2024-04-10 DIAGNOSIS — L97521 Non-pressure chronic ulcer of other part of left foot limited to breakdown of skin: Secondary | ICD-10-CM | POA: Diagnosis not present

## 2024-04-10 NOTE — Progress Notes (Unsigned)
 Subjective: Chief Complaint  Patient presents with   Foot Ulcer    RM#12 Follow up on left big toe ulcer no pain no drainage.    58 year old female presents the office today for follow-up evaluation of a wound on her left foot, skin fissure.  She been using hydrogel on the wound.  She is not reporting increasing swelling or redness.  No drainage or pus that she reports.  She has been wearing her croc shoe more.  No fevers or chills.  Objective: AAO x3, NAD DP/PT pulses palpable bilaterally, CRT less than 3 seconds  LEFT: Hyperkeratotic lesion, skin fissure noted at the plantar aspect left hallux.  Today there was a area increased dried blood under the callus.  Upon debridement there is some mild bleeding noted the skin fissure.  There is a superficial area of skin breakdown but there is no probing, undermining or tunneling.  There is no surrounding erythema, ascending cellulitis.  No fluctuation, crepitation, malodor. Right: There is what appears to be a small dried hemorrhagic blister in the right fifth toe without any skin breakdown.  No signs of infection.  No edema, erythema. No pain with calf compression, swelling, warmth, erythema bilaterally  Assessment: Left hallux ulceration/skin fissure; blister right fifth toe without signs of infection  Plan: Left hallux ulceration - Medically necessary wound debridement performed today.  Sharply debrided the hyperkeratotic lesion on the plantar aspect the hallux.  Initially with preulcerative there is no skin breakdown but once I debrided this there was an area of skin breakdown measuring 0.3 x 0.2 x 0.1 cm without any probing, undermining or tunneling.  There is minimal blood loss.  Hemostasis achieved with manual compression.  Cleaned the area.  Silvadene  was applied followed by dressing.  Continue with daily dressing changes, offloading.  Advised her going back to wearing her sneaker as opposed to her croc as this could been what is causing the  increased callus, ulcer.   Right fifth toe blister - Normal blister on the right fifth toe infection.  This could also be coming from her shoes.  Discussed going back to wearing her sneakers more frequently.  Monitoring signs or symptoms of infection.   [name] DPM

## 2024-04-14 NOTE — Progress Notes (Unsigned)
  Electrophysiology Office Note:   Date:  04/16/2024  ID:  [name], DOB Jul 12, 1966, MRN [id number]  Primary Cardiologist: None Electrophysiologist: Richardo Chandler, MD      History of Present Illness:   [name] is a 58 y.o. female with h/o HTN, recurrent syncope, DM2 and neuropathy, CIDP, and documented orthostatic hypotension seen today for routine electrophysiology followup.   Seen 10/2023. Lisinopril  and bisoprolol  decreased.  Since last being seen in our clinic the patient reports doing OK. No new complaints today.   She is only having syncope 2-3 times a month. Has "stopped counting" her falls. Uses WC on bad days, otherwise gets around with a walker. Uses a compressive cami. Tights and leggings are difficult to don and doff so she avoids. Drinking at least 80 oz fluid daily. Denies edema.   Review of systems complete and found to be negative unless listed in HPI.   EP Information / Studies Reviewed:    EKG is ordered today. Personal review as below.  EKG Interpretation Date/Time:  Thursday Apr 16 2024 08:05:23 EDT Ventricular Rate:  83 PR Interval:  148 QRS Duration:  80 QT Interval:  374 QTC Calculation: 439 R Axis:   -37  Text Interpretation: Sinus rhythm with occasional Premature ventricular complexes Left axis deviation Inferior infarct , age undetermined Possible Anterior infarct , age undetermined When compared with ECG of 11-Oct-2023 08:26, Premature ventricular complexes are now Present Borderline criteria for Anterior infarct are now Present Confirmed by Pilar Bridge [phone]) on 04/16/2024 8:09:13 AM    Arrhythmia/Device History No specialty comments available.   Physical Exam:   VS:  BP 106/60 (BP Location: Right Arm, Patient Position: Sitting, Cuff Size: Large)   Pulse 83   Ht 5\' 6"  (1.676 m)   Wt 276 lb 8 oz (125.4 kg)   SpO2 94%   BMI 44.63 kg/m    Wt Readings from Last 3 Encounters:  04/16/24 276 lb 8 oz (125.4 kg)  01/07/24 287 lb (130.2 kg)   10/11/23 290 lb 9.6 oz (131.8 kg)     GEN: No acute distress NECK: No JVD; No carotid bruits CARDIAC: Regular rate and rhythm, no murmurs, rubs, gallops RESPIRATORY:  Clear to auscultation without rales, wheezing or rhonchi  ABDOMEN: Soft, non-tender, non-distended EXTREMITIES:  No edema; No deformity   ASSESSMENT AND PLAN:    Orthostatic hypotension Syncope HTN Continue clonidine  0.1 mg patch weekly BB and lisinopril  decreased at last episode  Diabetes Obesity Body mass index is 44.63 kg/m.  Encouraged lifestyle modification  Per primary     Follow up with EP APP in 12 months. Aware of Dr. Antone Batten pending retirement.   Signed, Tylene Galla, PA-C

## 2024-04-16 ENCOUNTER — Encounter: Payer: Self-pay | Admitting: Student

## 2024-04-16 ENCOUNTER — Ambulatory Visit: Attending: Student | Admitting: Student

## 2024-04-16 VITALS — BP 106/60 | HR 83 | Ht 66.0 in | Wt 276.5 lb

## 2024-04-16 DIAGNOSIS — I951 Orthostatic hypotension: Secondary | ICD-10-CM | POA: Diagnosis not present

## 2024-04-16 DIAGNOSIS — R55 Syncope and collapse: Secondary | ICD-10-CM

## 2024-04-16 DIAGNOSIS — I10 Essential (primary) hypertension: Secondary | ICD-10-CM

## 2024-04-16 NOTE — Patient Instructions (Signed)
 Medication Instructions:  Your physician recommends that you continue on your current medications as directed. Please refer to the Current Medication list given to you today.  *If you need a refill on your cardiac medications before your next appointment, please call your pharmacy*  Lab Work: None ordered If you have labs (blood work) drawn today and your tests are completely normal, you will receive your results only by: MyChart Message (if you have MyChart) OR A paper copy in the mail If you have any lab test that is abnormal or we need to change your treatment, we will call you to review the results.  Follow-Up: At [facility], you and your health needs are our priority.  As part of our continuing mission to provide you with exceptional heart care, our providers are all part of one team.  This team includes your primary Cardiologist (physician) and Advanced Practice Providers or APPs (Physician Assistants and Nurse Practitioners) who all work together to provide you with the care you need, when you need it.  Your next appointment:   1 year(s)  Provider:   Bambi Lever "Michaelle Adolphus, PA-C

## 2024-04-26 ENCOUNTER — Other Ambulatory Visit: Payer: Self-pay | Admitting: Gastroenterology

## 2024-04-27 ENCOUNTER — Other Ambulatory Visit: Payer: Self-pay | Admitting: Gastroenterology

## 2024-05-08 ENCOUNTER — Encounter: Payer: Self-pay | Admitting: Podiatry

## 2024-05-08 ENCOUNTER — Ambulatory Visit: Admitting: Podiatry

## 2024-05-08 DIAGNOSIS — L84 Corns and callosities: Secondary | ICD-10-CM

## 2024-05-08 DIAGNOSIS — E1140 Type 2 diabetes mellitus with diabetic neuropathy, unspecified: Secondary | ICD-10-CM | POA: Diagnosis not present

## 2024-05-08 DIAGNOSIS — M2041 Other hammer toe(s) (acquired), right foot: Secondary | ICD-10-CM

## 2024-05-08 DIAGNOSIS — M2042 Other hammer toe(s) (acquired), left foot: Secondary | ICD-10-CM

## 2024-05-08 DIAGNOSIS — Z872 Personal history of diseases of the skin and subcutaneous tissue: Secondary | ICD-10-CM | POA: Diagnosis not present

## 2024-05-08 DIAGNOSIS — E1149 Type 2 diabetes mellitus with other diabetic neurological complication: Secondary | ICD-10-CM

## 2024-05-08 NOTE — Progress Notes (Signed)
 Subjective: Chief Complaint  Patient presents with   Foot Ulcer    RM#13 Left foot big toe ulcer follow up no drainage.    58 year old female presents the office today for follow-up evaluation of a wound on her left foot, skin fissure.  She said that she still keeps Vaseline on this.  The calluses gotten quite big.  She denies any drainage or pus or any bleeding.   He is asking about area in the back of her left heel she had a blister which now she notices a knot.  No drainage or pus.  No injuries.  She is asked me to file the right big toenail as it is thick and it rubs inside shoes.  ROS: Negative except for otherwise mentioned above  Objective: AAO x3, NAD DP/PT pulses palpable bilaterally, CRT less than 3 seconds  Sensation decreased with SWMF LEFT: Thick hyperkeratotic lesion noted on plantar aspect of the hallux without any underlying ulceration however this preulcerative is there is dried blood present but there is no definitive skin breakdown identified today.  There is no surrounding erythema, ascending cellulitis.  There is no fluctuation or crepitation.  There is no malodor. On the posterior aspect the left there is a prominence, bone spur present along the heel.  There is a preulcerative lesion noted with there is no blister or any drainage noted today.  No erythema or warmth. Left: No open lesions.  The right hallux nail is thick there is no edema, erythema or sign.  It is not significantly elongated. Digital contractures present bilaterally. No pain with calf compression, swelling, warmth, erythema bilaterally  Assessment: Left hallux ulceration/skin fissure; bone spur left heel; onychodystrophy  Plan: Left hallux preulcerative lesion -Sharply debrided hyperkeratotic lesion to the any complications or bleeding..  The wound is healed with the area still quite thick and she gets a skin fissure present.  Recommend continue moisturizer, offloading.  I do think she will also  benefit from new insoles to help decrease the pressure to help facilitate with her skin breakdown and ulcerations.  She also has digital contractures which I think would beneficial to help offload. -Monitor for any clinical signs or symptoms of infection and directed to call the office immediately should any occur or go to the ER.  Right hallux onychodystrophy - I smoothed the nail with any complications or bleeding  Left heel spur, pre-ulcerative lesion - I dispensed a gel Achilles sleeve to help decrease the pressure.  Prescription shoes avoid excess pressure.  Offloading.  Monitor for any signs or symptoms of infection.  Return in about 4 weeks (around 06/05/2024) for pre-ulcerative callus.  Charity Conch DPM

## 2024-05-11 ENCOUNTER — Other Ambulatory Visit: Payer: Self-pay | Admitting: Gastroenterology

## 2024-05-25 ENCOUNTER — Other Ambulatory Visit (INDEPENDENT_AMBULATORY_CARE_PROVIDER_SITE_OTHER)

## 2024-05-25 ENCOUNTER — Ambulatory Visit: Admitting: Gastroenterology

## 2024-05-25 ENCOUNTER — Encounter: Payer: Self-pay | Admitting: Gastroenterology

## 2024-05-25 ENCOUNTER — Other Ambulatory Visit: Payer: Self-pay | Admitting: Gastroenterology

## 2024-05-25 VITALS — BP 98/60 | HR 84 | Ht 64.5 in | Wt 274.5 lb

## 2024-05-25 DIAGNOSIS — Z860101 Personal history of adenomatous and serrated colon polyps: Secondary | ICD-10-CM

## 2024-05-25 DIAGNOSIS — Z860100 Personal history of colon polyps, unspecified: Secondary | ICD-10-CM

## 2024-05-25 DIAGNOSIS — K760 Fatty (change of) liver, not elsewhere classified: Secondary | ICD-10-CM

## 2024-05-25 DIAGNOSIS — K219 Gastro-esophageal reflux disease without esophagitis: Secondary | ICD-10-CM | POA: Diagnosis not present

## 2024-05-25 DIAGNOSIS — K222 Esophageal obstruction: Secondary | ICD-10-CM | POA: Diagnosis not present

## 2024-05-25 DIAGNOSIS — K582 Mixed irritable bowel syndrome: Secondary | ICD-10-CM

## 2024-05-25 LAB — CBC WITH DIFFERENTIAL/PLATELET
Basophils Absolute: 0 10*3/uL (ref 0.0–0.1)
Basophils Relative: 0.5 % (ref 0.0–3.0)
Eosinophils Absolute: 0 10*3/uL (ref 0.0–0.7)
Eosinophils Relative: 0.6 % (ref 0.0–5.0)
HCT: 40.1 % (ref 36.0–46.0)
Hemoglobin: 13.5 g/dL (ref 12.0–15.0)
Lymphocytes Relative: 36.4 % (ref 12.0–46.0)
Lymphs Abs: 0.9 10*3/uL (ref 0.7–4.0)
MCHC: 33.6 g/dL (ref 30.0–36.0)
MCV: 88.8 fl (ref 78.0–100.0)
Monocytes Absolute: 0.5 10*3/uL (ref 0.1–1.0)
Monocytes Relative: 18.3 % — ABNORMAL HIGH (ref 3.0–12.0)
Neutro Abs: 1.1 10*3/uL — ABNORMAL LOW (ref 1.4–7.7)
Neutrophils Relative %: 44.2 % (ref 43.0–77.0)
Platelets: 160 10*3/uL (ref 150.0–400.0)
RBC: 4.51 Mil/uL (ref 3.87–5.11)
RDW: 13.9 % (ref 11.5–15.5)
WBC: 2.6 10*3/uL — ABNORMAL LOW (ref 4.0–10.5)

## 2024-05-25 LAB — COMPREHENSIVE METABOLIC PANEL WITH GFR
ALT: 31 U/L (ref 0–35)
AST: 36 U/L (ref 0–37)
Albumin: 3.6 g/dL (ref 3.5–5.2)
Alkaline Phosphatase: 88 U/L (ref 39–117)
BUN: 20 mg/dL (ref 6–23)
CO2: 28 meq/L (ref 19–32)
Calcium: 9.2 mg/dL (ref 8.4–10.5)
Chloride: 98 meq/L (ref 96–112)
Creatinine, Ser: 0.88 mg/dL (ref 0.40–1.20)
GFR: 73.32 mL/min (ref >60.00)
Glucose, Bld: 123 mg/dL — ABNORMAL HIGH (ref 70–99)
Potassium: 4 meq/L (ref 3.5–5.1)
Sodium: 133 meq/L — ABNORMAL LOW (ref 135–145)
Total Bilirubin: 0.3 mg/dL (ref 0.2–1.2)
Total Protein: 9.3 g/dL — ABNORMAL HIGH (ref 6.0–8.3)

## 2024-05-25 LAB — PROTIME-INR
INR: 1.1 ratio — ABNORMAL HIGH (ref 0.8–1.0)
Prothrombin Time: 11.8 s (ref 9.6–13.1)

## 2024-05-25 LAB — HEMOGLOBIN A1C: Hgb A1c MFr Bld: 6.5 % (ref 4.6–6.5)

## 2024-05-25 NOTE — H&P (View-Only) (Signed)
 IMPRESSION and PLAN:    #1. H/O Colonic Polyps. S/P colon 06/2018, fair prep, Bx- TAs.  #2. GERD with eso stricture s/p EGD with dil 18 mm TTS 06/2018 with significant resolution of dysphagia.  Prev Bx neg for EoE.  #3. MASLD (Fatty Liver) on ozempic.  CT chest 04/2022 showing findings now suspicious for liver cirrhosis.  Has associated obesity, DM2, HLD. Dx 2001 on liver biopsy at [facility] showing stage I steatohepatitis.  H/O hepatic adenomas 1990s at [facility].  Being followed by serial ultrasounds. Neg autoimmune hepatitis profile, AMA, ceruloplasmin, alpha-1 antitrypsin, p-ANCA, acute hepatitis profile 09/2017.  #4. IBS-D, now with constipation. Neg random colonic bx for microscopic colitis 2008.   Plan: -EGD with dil at [facility] (off ozempic x 1 week) -Continue Hyoscamine 0.125 mg S/L 2 tabs before meals and Q4hrs prn #120  11 RF -Decrease Bentyl  10 mg p.o. every day.  Wean off and stop in a month or so -Zofran  8mg  ODT Q8hrs prn #30, 4 refills -Miralax  17g po every day to continue. -Continue Protonix  40mg  po bid #180, 4 refills -CBC, CMP, PT INR, AFP, Fib-4, HBA1c -US  abdo. -Encouraged continued weight loss on Ozempic. -If still with problems, CT AP with contrast -Rpt cologuard 11/2024. -Would like to hold off on colon      HPI:    Chief Complaint:   [name] is a 58 y.o. female  With PMH of DM2 req insulin , HTN, HLD, fatty liver, CIDP, fibromyalgia, dysautonomia, OSA on BIPAP, anxiety/depression, obesity, migraines thyroid  cancer s/p resection with secondary hypothyroidism For follow-up visit  Discussed the use of AI scribe software for clinical note transcription with the patient, who gave verbal consent to proceed.  History of Present Illness [name] is a 58 year old female with esophageal issues and fatty liver who presents with nausea, vomiting, and difficulty swallowing.  She experiences nausea and vomiting, particularly when food gets lodged in her  throat. She takes Zofran  for nausea, which she finds effective, and ensures to take it regularly to prevent vomiting. Foods like chicken, rice, and meat tend to get stuck, leading to episodes where vomiting occurs through her nose and mouth when food is lodged.  She has a history of esophageal issues and underwent an endoscopy with dilation in 2019. Her symptoms have been occurring more frequently, and she manages them by eating earlier in the day and chewing food well. Nausea occurs if she waits too long to take her medication.  She has a history of cervical spine issues, including spondylosis and multiple bulges, causing neck pain. She manages the pain with stretches.  She has a history of colon polyps and underwent a colonoscopy in 2019, which was difficult for her to prepare for. She completed a Cologuard test in 2022.  She is currently on Ozempic, which has led to constipation, with bowel movements occurring about once a week. She takes Bentyl  twice a day for spasms and uses Miralax  to manage constipation. She also takes Protonix  twice a day.  She has a history of fatty liver and adenomas, with liver enzymes that have fluctuated but are currently lower than previous levels. She has been losing weight with the help of Ozempic, having reduced her weight from 309 pounds to 274 pounds.  No epistaxis or significant gum bleeding. No diarrhea. She reports chest pain, which she attributes to muscular issues rather than cardiac.    Wt Readings from Last 3 Encounters:  05/25/24 274 lb  8 oz (124.5 kg)  04/16/24 276 lb 8 oz (125.4 kg)  01/07/24 287 lb (130.2 kg)    Past GI procedures: Neg cologuard 11/2021, rpt 3 yrs. EGD 06/2018 -distal eso stricture s/p dil -Negative biopsies for EOE.  Negative small bowel biopsies previously -Negative H. pylori.  Colon 06/2018 -Polyps s/p polypectomy. Bx- TA.  Does not want another. -Negative random colon biopsies for microscopic colitis.  2DE 01/2021: Nl  EF 60-65% Past Medical History:  Diagnosis Date   Anginal pain (HCC)    chest pain  heart cath done   Anxiety    Cancer [facility])    thyroid  cancer   Chronic bronchitis (HCC)    get it q yr   Chronic inflammatory demyelinating polyneuropathy (HCC)    Chronic kidney disease    Proteinuria   Dyspnea    sob with exertion   Early cataract    right   Fibromyalgia    GERD (gastroesophageal reflux disease)    Heart murmur    Hepatic adenoma    High cholesterol    History of gout    History of hiatal hernia    History of kidney stones    Hypertension    Hypothyroidism    IBS (irritable bowel syndrome)    Iron deficiency anemia    comes and goes   Migraine    @ least once/month (02/18/2015)   Obesity    PONV (postoperative nausea and vomiting)    patch and anti nausea meds work well   Proteinuria    Spinal headache    Syncope    Tachycardia    Type II diabetes mellitus (HCC)    pt has an insulin  pump   Wears glasses     Current Outpatient Medications  Medication Sig Dispense Refill   acetaminophen  (TYLENOL ) 500 MG tablet Take 1,000 mg by mouth every 6 (six) hours as needed for moderate pain or headache.      albuterol  (VENTOLIN  HFA) 108 (90 Base) MCG/ACT inhaler Inhale 2 puffs into the lungs every 6 (six) hours as needed for wheezing or shortness of breath. inhale 2 puffs DAILY 6.7 g 11   ALPRAZolam  (XANAX ) 0.5 MG tablet Take 1 tablet by mouth every 6 (six) hours as needed.     ammonium lactate  (LAC-HYDRIN ) 12 % lotion APPLY TO ARMS   HANDS 2 TIMES A DAY ESPECIALLY AFTER BATH     APPLE CIDER VINEGAR PO Take 1 each by mouth daily. One gummy once a day     ARTIFICIAL TEARS 0.2-0.2-1 % SOLN See admin instructions.     ASPIRIN  81 PO 81 tablets every day by oral route.     atorvastatin  (LIPITOR) 20 MG tablet Take 1 tablet by mouth daily.     Azelastine HCl 137 MCG/SPRAY SOLN daily.     b complex vitamins tablet Take 1 tablet by mouth daily.     Bayer Microlet Lancets  lancets 4 each by Other route 4 (four) times daily.     Bepotastine Besilate 1.5 % SOLN 1 drop 2 (two) times daily.     bisoprolol  (ZEBETA ) 5 MG tablet TAKE ONE TABLET BY MOUTH DAILY **MUST KEEP UPCOMING NOVEMBER APPOINTMENT FOR MORE REFILLS** (Patient taking differently: Take 2.5 mg by mouth daily.) 45 tablet 3   Black Pepper-Turmeric (TURMERIC CURCUMIN) 04-999 MG CAPS See admin instructions. At bedtime     cetirizine (ZYRTEC) 10 MG tablet 1 tablet as needed     clobetasol cream (TEMOVATE) 0.05 % Apply 1 application  topically 2 (two) times daily as needed.      cloNIDine  (CATAPRES  - DOSED IN MG/24 HR) 0.1 mg/24hr patch Place 1 patch (0.1 mg total) onto the skin once a week. 12 patch 3   Continuous Blood Gluc Sensor (FREESTYLE LIBRE 3 SENSOR) MISC USE AS DIRECTED TO CHECK BLOOD SUGARS     D3-50 50000 units capsule Take 50,000 Units by mouth every Saturday.  4   dapagliflozin propanediol (FARXIGA) 5 MG TABS tablet Take 10 mg by mouth daily.     diclofenac  Sodium (VOLTAREN ) 1 % GEL APPLY 2 GRAMS TO AFFECTED AREA OF FOOT 2-4 TIMES DAILY 100 g 0   dicyclomine  (BENTYL ) 10 MG capsule Take 1 capsule (10 mg total) by mouth every 8 (eight) hours as needed for spasms. 120 capsule 11   diphenhydrAMINE  (BENADRYL ) 50 MG/ML injection      DULoxetine (CYMBALTA) 60 MG capsule Take 60 mg by mouth daily.     fluticasone  (FLONASE ) 50 MCG/ACT nasal spray Place 1 spray into both nostrils at bedtime.     fluticasone  furoate-vilanterol (BREO ELLIPTA ) 200-25 MCG/ACT AEPB Inhale 1 puff into the lungs daily. 60 each 6   furosemide  (LASIX ) 40 MG tablet Take 40 mg by mouth every other day.     furosemide  (LASIX ) 80 MG tablet Take 80 mg by mouth daily.     gabapentin  (NEURONTIN ) 300 MG capsule Take 2 capsules (600 mg total) by mouth 3 (three) times daily. 540 capsule 1   GAMUNEX-C  20 GM/200ML SOLN      glucose blood (CONTOUR NEXT TEST) test strip 4 each by Other route 4 (four) times daily.     HYDROcodone -acetaminophen   (NORCO) 7.5-325 MG tablet Take 1-2 tablets by mouth every 6 (six) hours as needed for moderate pain (for pain.). 40 tablet 0   hyoscyamine  (LEVSIN  SL) 0.125 MG SL tablet PLACE 2 TABLETS UNDER TONGUE EVERY 4 HOURS AS NEEDED WITH MEALS. 120 tablet 1   Insulin  Human (INSULIN  PUMP) SOLN Inject into the skin.     insulin  regular human CONCENTRATED (HUMULIN R ) 500 UNIT/ML injection Inject 2 Units into the skin See admin instructions. 2.0 units /hour 0500-2359, 1.95units 0000-0459 with carb ratio 1:50 for meal time bolus     Ivermectin 1 % CREA Apply topically.     ketoconazole (NIZORAL) 2 % cream Apply 1 application topically 2 (two) times daily as needed (for toes).      levothyroxine  (SYNTHROID ) 137 MCG tablet Take 2 tablets by mouth daily.     lisinopril  (ZESTRIL ) 10 MG tablet Take 0.5 tablets (5 mg total) by mouth daily. (Patient taking differently: Take 10 mg by mouth daily.)     metFORMIN  (GLUCOPHAGE ) 1000 MG tablet 1 tablet with a meal     methocarbamol (ROBAXIN) 500 MG tablet Take 500 mg by mouth 3 (three) times daily as needed.     metroNIDAZOLE (METROGEL) 1 % gel Apply topically 2 (two) times daily.     Multiple Vitamin (MULTIVITAMIN) tablet Take 1 tablet by mouth at bedtime.      mupirocin  ointment (BACTROBAN ) 2 % Apply 1 Application topically 2 (two) times daily. 30 g 2   NURTEC 75 MG TBDP Take 1 tablet by mouth daily.     ondansetron  (ZOFRAN -ODT) 8 MG disintegrating tablet TAKE ONE TABLET BY MOUTH EVERY 8 HOURS AS NEEDED 30 tablet 11   OZEMPIC, 1 MG/DOSE, 4 MG/3ML SOPN Inject into the skin.     pantoprazole  (PROTONIX ) 40 MG tablet Take 1 tablet (40 mg  total) by mouth 2 (two) times daily. 180 tablet 0   polyethylene glycol (MIRALAX  / GLYCOLAX ) 17 g packet Take 17 g by mouth daily. 90 packet 3   PROCTOSOL HC 2.5 % rectal cream Apply 1 application topically 2 (two) times daily as needed for hemorrhoids.   2   sodium chloride  0.9 % infusion Inject into the vein.     Spacer/Aero-Holding  Chambers (AEROCHAMBER MV) inhaler Use as instructed 1 each 0   SSD 1 % cream Apply 1 application topically daily. 50 g 0   VRAYLAR 1.5 MG capsule Take 1.5 mg by mouth daily.     Wound Dressings (HYDROGEL) GEL Apply 1 Application topically daily as needed. 120 mL 1   No current facility-administered medications for this visit.    Past Surgical History:  Procedure Laterality Date   ABDOMINAL HYSTERECTOMY  ~2012   lap   ANKLE RECONSTRUCTION Left 03/29/2016   Procedure: LEFT ANKLE LATERAL LIGAMENT RECONSTRUCTION AND PERONEAL TENDON REPAIR OR TENOLYSIS;  Surgeon: Amada Backer, MD;  Location: [facility] OR;  Service: Orthopedics;  Laterality: Left;   BACK SURGERY     CARDIAC CATHETERIZATION  04/2009   clean   COLONOSCOPY  01/12/2015   Colonic polyps status post polypectomy, small internal hemorrhoids. Otherwise normal. Bx: mild chronic gastritis.   CYSTOSCOPY WITH URETEROSCOPY, STONE BASKETRY AND STENT PLACEMENT  ~ 2006   ESOPHAGOGASTRODUODENOSCOPY  07/21/2015   Esophageal stricture status post esophageal dilatation. Mild gastritis.   insulin  pump     U 500   IR IMAGING GUIDED PORT INSERTION  09/22/2018   IRRIGATION AND DEBRIDEMENT KNEE Left 04/03/2018   Procedure: Arthroscopic versus open irrigation and debridement  left knee;  Surgeon: Janeth Medicus, MD;  Location: [facility] OR;  Service: Orthopedics;  Laterality: Left;   LAPAROSCOPIC CHOLECYSTECTOMY  05/2004   LUMBAR DISC SURGERY  08/2011   SURAL NERVE BX Left 09/30/2017   Procedure: LEFT SURAL NERVE AND MUSCLE BIOPSY;  Surgeon: Garry Kansas, MD;  Location: [facility] OR;  Service: Neurosurgery;  Laterality: Left;  LEFT SURAL NERVE AND MUSCLE BIOPSY   THYROIDECTOMY N/A 02/18/2015   Procedure: TOTAL THYROIDECTOMY;  Surgeon: Oralee Billow, MD;  Location: [facility] OR;  Service: General;  Laterality: N/A;   TOTAL THYROIDECTOMY  02/18/2015    Family History  Problem Relation Age of Onset   Hypertension Mother    Emphysema Mother    AAA (abdominal aortic  aneurysm) Mother    Hypertension Father    Coronary artery disease Father    Cancer - Prostate Father    Renal Disease Father    Hypertension Brother    Colon cancer Neg Hx    Esophageal cancer Neg Hx    Liver cancer Neg Hx    Pancreatic cancer Neg Hx    Rectal cancer Neg Hx    Stomach cancer Neg Hx     Social History   Tobacco Use   Smoking status: Never   Smokeless tobacco: Never  Vaping Use   Vaping status: Never Used  Substance Use Topics   Alcohol use: No   Drug use: No    Allergies  Allergen Reactions   Latex Itching, Other (See Comments) and Rash    Blisters with prolonged contact   Penicillins Anaphylaxis, Shortness Of Breath, Swelling, Palpitations and Other (See Comments)    Has patient had a PCN reaction causing immediate rash, facial/tongue/throat swelling, SOB or lightheadedness with hypotension: Yes Has patient had a PCN reaction causing severe rash involving mucus membranes or  skin necrosis: No Has patient had a PCN reaction that required hospitalization No Has patient had a PCN reaction occurring within the last 10 years: No If all of the above answers are NO, then may proceed with Cephalosporin use.    Clarithromycin Nausea And Vomiting and Other (See Comments)    Any antibiotic (doxyclycine, etc)   Adhesive [Tape] Itching and Other (See Comments)    Redness   Moxifloxacin Nausea And Vomiting and Other (See Comments)    Needs Zofran  or Phenergan    Other Other (See Comments)    All antibiotics makes patient sick     Review of Systems: All systems reviewed and negative except where noted in HPI.    Physical Exam:     BP 98/60 (BP Location: Left Arm, Patient Position: Sitting, Cuff Size: Large)   Pulse 84   Ht 5' 4.5 (1.638 m) Comment: height measured without shoes  Wt 274 lb 8 oz (124.5 kg)   BMI 46.39 kg/m   Filed Weights   05/25/24 1435  Weight: 274 lb 8 oz (124.5 kg)    GENERAL:  Alert, oriented, cooperative, not in acute  distress. PSYCH: :Pleasant, normal mood and affect. HEENT:  conjunctiva pink, mucous membranes moist, neck supple without masses. No jaundice. CARDIAC:  S1 S2 normal. No murmers. PULM: Normal respiratory effort, lungs CTA bilaterally, no wheezing. ABDOMEN: Inspection: No visible peristalsis, no abnormal pulsations, skin normal.  Palpation/percussion: Soft, nontender, nondistended, no rigidity, no abnormal dullness to percussion, no hepatosplenomegaly and no palpable abdominal masses.  Auscultation: Normal bowel sounds, no abdominal bruits. Rectal exam: Deferred SKIN:  turgor, no lesions seen. NEURO: Alert and oriented x 3, no focal neurologic deficits.   Data Reviewed: I have personally reviewed following labs and imaging studies     Latest Ref Rng & Units 01/15/2023    9:28 AM 12/06/2022   11:04 AM 09/22/2018    7:37 AM  CBC  WBC 4.0 - 10.5 K/uL 4.0  5.1  4.5   Hemoglobin 12.0 - 15.0 g/dL 16.1  09.6  04.5   Hematocrit 36.0 - 46.0 % 42.2  41.4  43.1   Platelets 150.0 - 400.0 K/uL 182.0  163  177       Latest Ref Rng & Units 01/15/2023    9:28 AM 09/26/2017    9:44 AM 07/01/2009    9:34 AM  Hepatic Function  Total Protein 6.0 - 8.3 g/dL 40.9  6.9  6.0   Albumin 3.5 - 5.2 g/dL 3.8  4.1  3.2   AST 0 - 37 U/L 47  105  47   ALT 0 - 35 U/L 48  71  32   Alk Phosphatase 39 - 117 U/L 71  83  39   Total Bilirubin 0.2 - 1.2 mg/dL 0.4  0.5  1.1     Results LABS AST: 44 (01/2024) ALT: 47 (01/2024) ALP: 87 (01/2024) Albumin: 3.8 (01/2024) AST: 105 (2024) ALT: 71 (2024) HbA1c: 6.1 (02/2024) TSH: 34 (02/2024)  RADIOLOGY Cervical spine MRI: Spondylosis with multiple bulges (05/2023)  DIAGNOSTIC Colonoscopy: Two polyps (2019)     Nakeshia Waldeck,MD 05/25/2024, 2:58 PM   CC Rosslyn Coons, MD

## 2024-05-25 NOTE — Progress Notes (Signed)
 IMPRESSION and PLAN:    #1. H/O Colonic Polyps. S/P colon 06/2018, fair prep, Bx- TAs.  #2. GERD with eso stricture s/p EGD with dil 18 mm TTS 06/2018 with significant resolution of dysphagia.  Prev Bx neg for EoE.  #3. MASLD (Fatty Liver) on ozempic.  CT chest 04/2022 showing findings now suspicious for liver cirrhosis.  Has associated obesity, DM2, HLD. Dx 2001 on liver biopsy at [facility] showing stage I steatohepatitis.  H/O hepatic adenomas 1990s at [facility].  Being followed by serial ultrasounds. Neg autoimmune hepatitis profile, AMA, ceruloplasmin, alpha-1 antitrypsin, p-ANCA, acute hepatitis profile 09/2017.  #4. IBS-D, now with constipation. Neg random colonic bx for microscopic colitis 2008.   Plan: -EGD with dil at [facility] (off ozempic x 1 week) -Continue Hyoscamine 0.125 mg S/L 2 tabs before meals and Q4hrs prn #120  11 RF -Decrease Bentyl  10 mg p.o. every day.  Wean off and stop in a month or so -Zofran  8mg  ODT Q8hrs prn #30, 4 refills -Miralax  17g po every day to continue. -Continue Protonix  40mg  po bid #180, 4 refills -CBC, CMP, PT INR, AFP, Fib-4, HBA1c -US  abdo. -Encouraged continued weight loss on Ozempic. -If still with problems, CT AP with contrast -Rpt cologuard 11/2024. -Would like to hold off on colon      HPI:    Chief Complaint:   [name] is a 58 y.o. female  With PMH of DM2 req insulin , HTN, HLD, fatty liver, CIDP, fibromyalgia, dysautonomia, OSA on BIPAP, anxiety/depression, obesity, migraines thyroid  cancer s/p resection with secondary hypothyroidism For follow-up visit  Discussed the use of AI scribe software for clinical note transcription with the patient, who gave verbal consent to proceed.  History of Present Illness [name] is a 58 year old female with esophageal issues and fatty liver who presents with nausea, vomiting, and difficulty swallowing.  She experiences nausea and vomiting, particularly when food gets lodged in her  throat. She takes Zofran  for nausea, which she finds effective, and ensures to take it regularly to prevent vomiting. Foods like chicken, rice, and meat tend to get stuck, leading to episodes where vomiting occurs through her nose and mouth when food is lodged.  She has a history of esophageal issues and underwent an endoscopy with dilation in 2019. Her symptoms have been occurring more frequently, and she manages them by eating earlier in the day and chewing food well. Nausea occurs if she waits too long to take her medication.  She has a history of cervical spine issues, including spondylosis and multiple bulges, causing neck pain. She manages the pain with stretches.  She has a history of colon polyps and underwent a colonoscopy in 2019, which was difficult for her to prepare for. She completed a Cologuard test in 2022.  She is currently on Ozempic, which has led to constipation, with bowel movements occurring about once a week. She takes Bentyl  twice a day for spasms and uses Miralax  to manage constipation. She also takes Protonix  twice a day.  She has a history of fatty liver and adenomas, with liver enzymes that have fluctuated but are currently lower than previous levels. She has been losing weight with the help of Ozempic, having reduced her weight from 309 pounds to 274 pounds.  No epistaxis or significant gum bleeding. No diarrhea. She reports chest pain, which she attributes to muscular issues rather than cardiac.    Wt Readings from Last 3 Encounters:  05/25/24 274 lb  8 oz (124.5 kg)  04/16/24 276 lb 8 oz (125.4 kg)  01/07/24 287 lb (130.2 kg)    Past GI procedures: Neg cologuard 11/2021, rpt 3 yrs. EGD 06/2018 -distal eso stricture s/p dil -Negative biopsies for EOE.  Negative small bowel biopsies previously -Negative H. pylori.  Colon 06/2018 -Polyps s/p polypectomy. Bx- TA.  Does not want another. -Negative random colon biopsies for microscopic colitis.  2DE 01/2021: Nl  EF 60-65% Past Medical History:  Diagnosis Date   Anginal pain (HCC)    chest pain  heart cath done   Anxiety    Cancer [facility])    thyroid  cancer   Chronic bronchitis (HCC)    get it q yr   Chronic inflammatory demyelinating polyneuropathy (HCC)    Chronic kidney disease    Proteinuria   Dyspnea    sob with exertion   Early cataract    right   Fibromyalgia    GERD (gastroesophageal reflux disease)    Heart murmur    Hepatic adenoma    High cholesterol    History of gout    History of hiatal hernia    History of kidney stones    Hypertension    Hypothyroidism    IBS (irritable bowel syndrome)    Iron deficiency anemia    comes and goes   Migraine    @ least once/month (02/18/2015)   Obesity    PONV (postoperative nausea and vomiting)    patch and anti nausea meds work well   Proteinuria    Spinal headache    Syncope    Tachycardia    Type II diabetes mellitus (HCC)    pt has an insulin  pump   Wears glasses     Current Outpatient Medications  Medication Sig Dispense Refill   acetaminophen  (TYLENOL ) 500 MG tablet Take 1,000 mg by mouth every 6 (six) hours as needed for moderate pain or headache.      albuterol  (VENTOLIN  HFA) 108 (90 Base) MCG/ACT inhaler Inhale 2 puffs into the lungs every 6 (six) hours as needed for wheezing or shortness of breath. inhale 2 puffs DAILY 6.7 g 11   ALPRAZolam  (XANAX ) 0.5 MG tablet Take 1 tablet by mouth every 6 (six) hours as needed.     ammonium lactate  (LAC-HYDRIN ) 12 % lotion APPLY TO ARMS   HANDS 2 TIMES A DAY ESPECIALLY AFTER BATH     APPLE CIDER VINEGAR PO Take 1 each by mouth daily. One gummy once a day     ARTIFICIAL TEARS 0.2-0.2-1 % SOLN See admin instructions.     ASPIRIN  81 PO 81 tablets every day by oral route.     atorvastatin  (LIPITOR) 20 MG tablet Take 1 tablet by mouth daily.     Azelastine HCl 137 MCG/SPRAY SOLN daily.     b complex vitamins tablet Take 1 tablet by mouth daily.     Bayer Microlet Lancets  lancets 4 each by Other route 4 (four) times daily.     Bepotastine Besilate 1.5 % SOLN 1 drop 2 (two) times daily.     bisoprolol  (ZEBETA ) 5 MG tablet TAKE ONE TABLET BY MOUTH DAILY **MUST KEEP UPCOMING NOVEMBER APPOINTMENT FOR MORE REFILLS** (Patient taking differently: Take 2.5 mg by mouth daily.) 45 tablet 3   Black Pepper-Turmeric (TURMERIC CURCUMIN) 04-999 MG CAPS See admin instructions. At bedtime     cetirizine (ZYRTEC) 10 MG tablet 1 tablet as needed     clobetasol cream (TEMOVATE) 0.05 % Apply 1 application  topically 2 (two) times daily as needed.      cloNIDine  (CATAPRES  - DOSED IN MG/24 HR) 0.1 mg/24hr patch Place 1 patch (0.1 mg total) onto the skin once a week. 12 patch 3   Continuous Blood Gluc Sensor (FREESTYLE LIBRE 3 SENSOR) MISC USE AS DIRECTED TO CHECK BLOOD SUGARS     D3-50 50000 units capsule Take 50,000 Units by mouth every Saturday.  4   dapagliflozin propanediol (FARXIGA) 5 MG TABS tablet Take 10 mg by mouth daily.     diclofenac  Sodium (VOLTAREN ) 1 % GEL APPLY 2 GRAMS TO AFFECTED AREA OF FOOT 2-4 TIMES DAILY 100 g 0   dicyclomine  (BENTYL ) 10 MG capsule Take 1 capsule (10 mg total) by mouth every 8 (eight) hours as needed for spasms. 120 capsule 11   diphenhydrAMINE  (BENADRYL ) 50 MG/ML injection      DULoxetine (CYMBALTA) 60 MG capsule Take 60 mg by mouth daily.     fluticasone  (FLONASE ) 50 MCG/ACT nasal spray Place 1 spray into both nostrils at bedtime.     fluticasone  furoate-vilanterol (BREO ELLIPTA ) 200-25 MCG/ACT AEPB Inhale 1 puff into the lungs daily. 60 each 6   furosemide  (LASIX ) 40 MG tablet Take 40 mg by mouth every other day.     furosemide  (LASIX ) 80 MG tablet Take 80 mg by mouth daily.     gabapentin  (NEURONTIN ) 300 MG capsule Take 2 capsules (600 mg total) by mouth 3 (three) times daily. 540 capsule 1   GAMUNEX-C  20 GM/200ML SOLN      glucose blood (CONTOUR NEXT TEST) test strip 4 each by Other route 4 (four) times daily.     HYDROcodone -acetaminophen   (NORCO) 7.5-325 MG tablet Take 1-2 tablets by mouth every 6 (six) hours as needed for moderate pain (for pain.). 40 tablet 0   hyoscyamine  (LEVSIN  SL) 0.125 MG SL tablet PLACE 2 TABLETS UNDER TONGUE EVERY 4 HOURS AS NEEDED WITH MEALS. 120 tablet 1   Insulin  Human (INSULIN  PUMP) SOLN Inject into the skin.     insulin  regular human CONCENTRATED (HUMULIN R ) 500 UNIT/ML injection Inject 2 Units into the skin See admin instructions. 2.0 units /hour 0500-2359, 1.95units 0000-0459 with carb ratio 1:50 for meal time bolus     Ivermectin 1 % CREA Apply topically.     ketoconazole (NIZORAL) 2 % cream Apply 1 application topically 2 (two) times daily as needed (for toes).      levothyroxine  (SYNTHROID ) 137 MCG tablet Take 2 tablets by mouth daily.     lisinopril  (ZESTRIL ) 10 MG tablet Take 0.5 tablets (5 mg total) by mouth daily. (Patient taking differently: Take 10 mg by mouth daily.)     metFORMIN  (GLUCOPHAGE ) 1000 MG tablet 1 tablet with a meal     methocarbamol (ROBAXIN) 500 MG tablet Take 500 mg by mouth 3 (three) times daily as needed.     metroNIDAZOLE (METROGEL) 1 % gel Apply topically 2 (two) times daily.     Multiple Vitamin (MULTIVITAMIN) tablet Take 1 tablet by mouth at bedtime.      mupirocin  ointment (BACTROBAN ) 2 % Apply 1 Application topically 2 (two) times daily. 30 g 2   NURTEC 75 MG TBDP Take 1 tablet by mouth daily.     ondansetron  (ZOFRAN -ODT) 8 MG disintegrating tablet TAKE ONE TABLET BY MOUTH EVERY 8 HOURS AS NEEDED 30 tablet 11   OZEMPIC, 1 MG/DOSE, 4 MG/3ML SOPN Inject into the skin.     pantoprazole  (PROTONIX ) 40 MG tablet Take 1 tablet (40 mg  total) by mouth 2 (two) times daily. 180 tablet 0   polyethylene glycol (MIRALAX  / GLYCOLAX ) 17 g packet Take 17 g by mouth daily. 90 packet 3   PROCTOSOL HC 2.5 % rectal cream Apply 1 application topically 2 (two) times daily as needed for hemorrhoids.   2   sodium chloride  0.9 % infusion Inject into the vein.     Spacer/Aero-Holding  Chambers (AEROCHAMBER MV) inhaler Use as instructed 1 each 0   SSD 1 % cream Apply 1 application topically daily. 50 g 0   VRAYLAR 1.5 MG capsule Take 1.5 mg by mouth daily.     Wound Dressings (HYDROGEL) GEL Apply 1 Application topically daily as needed. 120 mL 1   No current facility-administered medications for this visit.    Past Surgical History:  Procedure Laterality Date   ABDOMINAL HYSTERECTOMY  ~2012   lap   ANKLE RECONSTRUCTION Left 03/29/2016   Procedure: LEFT ANKLE LATERAL LIGAMENT RECONSTRUCTION AND PERONEAL TENDON REPAIR OR TENOLYSIS;  Surgeon: Amada Backer, MD;  Location: [facility] OR;  Service: Orthopedics;  Laterality: Left;   BACK SURGERY     CARDIAC CATHETERIZATION  04/2009   clean   COLONOSCOPY  01/12/2015   Colonic polyps status post polypectomy, small internal hemorrhoids. Otherwise normal. Bx: mild chronic gastritis.   CYSTOSCOPY WITH URETEROSCOPY, STONE BASKETRY AND STENT PLACEMENT  ~ 2006   ESOPHAGOGASTRODUODENOSCOPY  07/21/2015   Esophageal stricture status post esophageal dilatation. Mild gastritis.   insulin  pump     U 500   IR IMAGING GUIDED PORT INSERTION  09/22/2018   IRRIGATION AND DEBRIDEMENT KNEE Left 04/03/2018   Procedure: Arthroscopic versus open irrigation and debridement  left knee;  Surgeon: Janeth Medicus, MD;  Location: [facility] OR;  Service: Orthopedics;  Laterality: Left;   LAPAROSCOPIC CHOLECYSTECTOMY  05/2004   LUMBAR DISC SURGERY  08/2011   SURAL NERVE BX Left 09/30/2017   Procedure: LEFT SURAL NERVE AND MUSCLE BIOPSY;  Surgeon: Garry Kansas, MD;  Location: [facility] OR;  Service: Neurosurgery;  Laterality: Left;  LEFT SURAL NERVE AND MUSCLE BIOPSY   THYROIDECTOMY N/A 02/18/2015   Procedure: TOTAL THYROIDECTOMY;  Surgeon: Oralee Billow, MD;  Location: [facility] OR;  Service: General;  Laterality: N/A;   TOTAL THYROIDECTOMY  02/18/2015    Family History  Problem Relation Age of Onset   Hypertension Mother    Emphysema Mother    AAA (abdominal aortic  aneurysm) Mother    Hypertension Father    Coronary artery disease Father    Cancer - Prostate Father    Renal Disease Father    Hypertension Brother    Colon cancer Neg Hx    Esophageal cancer Neg Hx    Liver cancer Neg Hx    Pancreatic cancer Neg Hx    Rectal cancer Neg Hx    Stomach cancer Neg Hx     Social History   Tobacco Use   Smoking status: Never   Smokeless tobacco: Never  Vaping Use   Vaping status: Never Used  Substance Use Topics   Alcohol use: No   Drug use: No    Allergies  Allergen Reactions   Latex Itching, Other (See Comments) and Rash    Blisters with prolonged contact   Penicillins Anaphylaxis, Shortness Of Breath, Swelling, Palpitations and Other (See Comments)    Has patient had a PCN reaction causing immediate rash, facial/tongue/throat swelling, SOB or lightheadedness with hypotension: Yes Has patient had a PCN reaction causing severe rash involving mucus membranes or  skin necrosis: No Has patient had a PCN reaction that required hospitalization No Has patient had a PCN reaction occurring within the last 10 years: No If all of the above answers are NO, then may proceed with Cephalosporin use.    Clarithromycin Nausea And Vomiting and Other (See Comments)    Any antibiotic (doxyclycine, etc)   Adhesive [Tape] Itching and Other (See Comments)    Redness   Moxifloxacin Nausea And Vomiting and Other (See Comments)    Needs Zofran  or Phenergan    Other Other (See Comments)    All antibiotics makes patient sick     Review of Systems: All systems reviewed and negative except where noted in HPI.    Physical Exam:     BP 98/60 (BP Location: Left Arm, Patient Position: Sitting, Cuff Size: Large)   Pulse 84   Ht 5' 4.5 (1.638 m) Comment: height measured without shoes  Wt 274 lb 8 oz (124.5 kg)   BMI 46.39 kg/m   Filed Weights   05/25/24 1435  Weight: 274 lb 8 oz (124.5 kg)    GENERAL:  Alert, oriented, cooperative, not in acute  distress. PSYCH: :Pleasant, normal mood and affect. HEENT:  conjunctiva pink, mucous membranes moist, neck supple without masses. No jaundice. CARDIAC:  S1 S2 normal. No murmers. PULM: Normal respiratory effort, lungs CTA bilaterally, no wheezing. ABDOMEN: Inspection: No visible peristalsis, no abnormal pulsations, skin normal.  Palpation/percussion: Soft, nontender, nondistended, no rigidity, no abnormal dullness to percussion, no hepatosplenomegaly and no palpable abdominal masses.  Auscultation: Normal bowel sounds, no abdominal bruits. Rectal exam: Deferred SKIN:  turgor, no lesions seen. NEURO: Alert and oriented x 3, no focal neurologic deficits.   Data Reviewed: I have personally reviewed following labs and imaging studies     Latest Ref Rng & Units 01/15/2023    9:28 AM 12/06/2022   11:04 AM 09/22/2018    7:37 AM  CBC  WBC 4.0 - 10.5 K/uL 4.0  5.1  4.5   Hemoglobin 12.0 - 15.0 g/dL 16.1  09.6  04.5   Hematocrit 36.0 - 46.0 % 42.2  41.4  43.1   Platelets 150.0 - 400.0 K/uL 182.0  163  177       Latest Ref Rng & Units 01/15/2023    9:28 AM 09/26/2017    9:44 AM 07/01/2009    9:34 AM  Hepatic Function  Total Protein 6.0 - 8.3 g/dL 40.9  6.9  6.0   Albumin 3.5 - 5.2 g/dL 3.8  4.1  3.2   AST 0 - 37 U/L 47  105  47   ALT 0 - 35 U/L 48  71  32   Alk Phosphatase 39 - 117 U/L 71  83  39   Total Bilirubin 0.2 - 1.2 mg/dL 0.4  0.5  1.1     Results LABS AST: 44 (01/2024) ALT: 47 (01/2024) ALP: 87 (01/2024) Albumin: 3.8 (01/2024) AST: 105 (2024) ALT: 71 (2024) HbA1c: 6.1 (02/2024) TSH: 34 (02/2024)  RADIOLOGY Cervical spine MRI: Spondylosis with multiple bulges (05/2023)  DIAGNOSTIC Colonoscopy: Two polyps (2019)     Nakeshia Waldeck,MD 05/25/2024, 2:58 PM   CC Rosslyn Coons, MD

## 2024-05-25 NOTE — Patient Instructions (Signed)
 _______________________________________________________  If your blood pressure at your visit was 140/90 or greater, please contact your primary care physician to follow up on this.  _______________________________________________________  If you are age 57 or older, your body mass index should be between 23-30. Your Body mass index is 46.39 kg/m. If this is out of the aforementioned range listed, please consider follow up with your Primary Care Provider.  If you are age 50 or younger, your body mass index should be between 19-25. Your Body mass index is 46.39 kg/m. If this is out of the aformentioned range listed, please consider follow up with your Primary Care Provider.   ________________________________________________________  The [facility] providers would like to encourage you to use MYCHART to communicate with providers for non-urgent requests or questions.  Due to long hold times on the telephone, sending your provider a message by [facility] may be a faster and more efficient way to get a response.  Please allow 48 business hours for a response.  Please remember that this is for non-urgent requests.  _______________________________________________________  Your provider has requested that you go to the basement level for lab work before leaving today. Press B on the elevator. The lab is located at the first door on the left as you exit the elevator.  You have been scheduled for an abdominal ultrasound at [facility] Address [address] [place], [place], [place] [address] on 06-01-24 at 10:15am. Please arrive 15 minutes prior to your appointment for registration. Make certain not to have anything to eat or drink midnight prior to your appointment. Should you need to reschedule your appointment, please contact radiology at [phone]. This test typically takes about 30 minutes to perform.  Repeat cologuard for 11-2024. Please call 2 months prior to schedule this. A letter  will be sent as it gets closer.  Continue current medications.  It has been recommended to you by your physician that you have a EGD at [facility] completed. Please contact our office at [phone] in July to see if we have September schedule out at the hospital. You will be scheduled for a pre-visit and procedure at that time.  Thank you,  Dr. Lajuan Pila

## 2024-05-26 LAB — NASH FIBROSURE(R) PLUS

## 2024-05-26 LAB — AFP TUMOR MARKER: AFP-Tumor Marker: 1.1 ng/mL

## 2024-05-29 ENCOUNTER — Telehealth: Payer: Self-pay

## 2024-05-29 ENCOUNTER — Ambulatory Visit: Payer: Self-pay | Admitting: Gastroenterology

## 2024-05-29 NOTE — Telephone Encounter (Signed)
 Dr. Geralyn Knee L. Holwerda given a verbal clearance since he was the doctor on call with [facility] [phone] on call number)  P [phone] and she was told him personally to do a basil drop by 20% 24 hours before the procedure.   Patient made aware and voiced understanding. She was told to call us  on Monday to give us  an update on how she feels and to reach out to [facility] as well and she voiced understanding  Can you call patient to make sure is doing alright before her procedure please?

## 2024-05-29 NOTE — Telephone Encounter (Addendum)
 Patient scheduled for 06-02-24. She said she took her ozempic on June 12 and she didn't take it on the 19th so she agreed to have the procedure because still haven't taken it today. Instructions done and will call her later as she is aware and she has access to mychart too   Case [id number]

## 2024-05-29 NOTE — Addendum Note (Signed)
 Addended by: Linette Rias E on: 05/29/2024 12:51 PM   Modules accepted: Orders

## 2024-06-01 ENCOUNTER — Ambulatory Visit (HOSPITAL_BASED_OUTPATIENT_CLINIC_OR_DEPARTMENT_OTHER)
Admission: RE | Admit: 2024-06-01 | Discharge: 2024-06-01 | Disposition: A | Discharge status: Home / Self Care | Source: Ambulatory Visit | Attending: Gastroenterology | Admitting: Gastroenterology

## 2024-06-01 DIAGNOSIS — K760 Fatty (change of) liver, not elsewhere classified: Secondary | ICD-10-CM | POA: Diagnosis not present

## 2024-06-01 DIAGNOSIS — K582 Mixed irritable bowel syndrome: Secondary | ICD-10-CM

## 2024-06-01 NOTE — Anesthesia Preprocedure Evaluation (Signed)
 Anesthesia Evaluation  Patient identified by MRN, date of birth, ID band Patient awake    Reviewed: Allergy & Precautions, NPO status , Patient's Chart, lab work & pertinent test results  History of Anesthesia Complications (+) PONV, POST - OP SPINAL HEADACHE and history of anesthetic complications  Airway Mallampati: II  TM Distance: >3 FB Neck ROM: Full    Dental no notable dental hx. (+) Teeth Intact, Dental Advisory Given   Pulmonary shortness of breath, asthma , sleep apnea and Oxygen sleep apnea    Pulmonary exam normal breath sounds clear to auscultation       Cardiovascular hypertension, Pt. on medications and Pt. on home beta blockers Normal cardiovascular exam Rhythm:Regular Rate:Normal  2022 Echo  1. Left ventricular ejection fraction, by estimation, is 60 to 65%. Left  ventricular ejection fraction by 3D volume is 61 %. The left ventricle has  normal function. The left ventricle has no regional wall motion  abnormalities. Indeterminate diastolic  filling due to E-A fusion.   2. Right ventricular systolic function is normal. The right ventricular  size is normal.   3. The mitral valve is normal in structure. No evidence of mitral valve  regurgitation. No evidence of mitral stenosis.   4. The aortic valve is normal in structure. Aortic valve regurgitation is  not visualized. No aortic stenosis is present.      Neuro/Psych  PSYCHIATRIC DISORDERS Anxiety Depression     Neuromuscular disease    GI/Hepatic hiatal hernia,GERD  ,,  Endo/Other  diabetes, Type 1Hypothyroidism    Renal/GU Renal disease     Musculoskeletal  (+) Arthritis ,  Fibromyalgia -  Abdominal  (+) + obese  Peds  Hematology   Anesthesia Other Findings All: Latex, PCN, Clarithromycin, moxifloxacin  Reproductive/Obstetrics                             Anesthesia Physical Anesthesia Plan  ASA: 3  Anesthesia  Plan: MAC   Post-op Pain Management: Minimal or no pain anticipated   Induction:   PONV Risk Score and Plan: TIVA and Treatment may vary due to age or medical condition  Airway Management Planned: Natural Airway and Nasal Cannula  Additional Equipment: None  Intra-op Plan:   Post-operative Plan: Extubation in OR  Informed Consent: I have reviewed the patients History and Physical, chart, labs and discussed the procedure including the risks, benefits and alternatives for the proposed anesthesia with the patient or authorized representative who has indicated his/her understanding and acceptance.     Dental advisory given  Plan Discussed with: CRNA  Anesthesia Plan Comments: (EGD, WITH DILATION USING SAVARY-GILLIARD DILATOR OVER GUIDEWIRE for stricture)        Anesthesia Quick Evaluation

## 2024-06-02 ENCOUNTER — Ambulatory Visit (HOSPITAL_COMMUNITY): Payer: Self-pay | Admitting: Anesthesiology

## 2024-06-02 ENCOUNTER — Encounter (HOSPITAL_COMMUNITY)
Admission: RE | Disposition: A | Discharge status: Home / Self Care | Payer: Self-pay | Source: Home / Self Care | Attending: Gastroenterology

## 2024-06-02 ENCOUNTER — Ambulatory Visit (HOSPITAL_COMMUNITY)
Admission: RE | Admit: 2024-06-02 | Discharge: 2024-06-02 | Disposition: A | Discharge status: Home / Self Care | Attending: Gastroenterology | Admitting: Gastroenterology

## 2024-06-02 ENCOUNTER — Other Ambulatory Visit: Payer: Self-pay

## 2024-06-02 ENCOUNTER — Encounter (HOSPITAL_COMMUNITY): Payer: Self-pay | Admitting: Gastroenterology

## 2024-06-02 DIAGNOSIS — Z6841 Body Mass Index (BMI) 40.0 and over, adult: Secondary | ICD-10-CM | POA: Insufficient documentation

## 2024-06-02 DIAGNOSIS — K2970 Gastritis, unspecified, without bleeding: Secondary | ICD-10-CM | POA: Diagnosis present

## 2024-06-02 DIAGNOSIS — K222 Esophageal obstruction: Secondary | ICD-10-CM

## 2024-06-02 DIAGNOSIS — Z7985 Long-term (current) use of injectable non-insulin antidiabetic drugs: Secondary | ICD-10-CM | POA: Diagnosis not present

## 2024-06-02 DIAGNOSIS — G6181 Chronic inflammatory demyelinating polyneuritis: Secondary | ICD-10-CM | POA: Insufficient documentation

## 2024-06-02 DIAGNOSIS — E1122 Type 2 diabetes mellitus with diabetic chronic kidney disease: Secondary | ICD-10-CM | POA: Insufficient documentation

## 2024-06-02 DIAGNOSIS — B3781 Candidal esophagitis: Secondary | ICD-10-CM | POA: Insufficient documentation

## 2024-06-02 DIAGNOSIS — Z794 Long term (current) use of insulin: Secondary | ICD-10-CM | POA: Insufficient documentation

## 2024-06-02 DIAGNOSIS — K3189 Other diseases of stomach and duodenum: Secondary | ICD-10-CM

## 2024-06-02 DIAGNOSIS — Z85850 Personal history of malignant neoplasm of thyroid: Secondary | ICD-10-CM | POA: Diagnosis not present

## 2024-06-02 DIAGNOSIS — E669 Obesity, unspecified: Secondary | ICD-10-CM | POA: Insufficient documentation

## 2024-06-02 DIAGNOSIS — N189 Chronic kidney disease, unspecified: Secondary | ICD-10-CM | POA: Diagnosis not present

## 2024-06-02 DIAGNOSIS — M797 Fibromyalgia: Secondary | ICD-10-CM | POA: Insufficient documentation

## 2024-06-02 DIAGNOSIS — F419 Anxiety disorder, unspecified: Secondary | ICD-10-CM | POA: Insufficient documentation

## 2024-06-02 DIAGNOSIS — R1310 Dysphagia, unspecified: Secondary | ICD-10-CM | POA: Diagnosis not present

## 2024-06-02 DIAGNOSIS — G4733 Obstructive sleep apnea (adult) (pediatric): Secondary | ICD-10-CM | POA: Insufficient documentation

## 2024-06-02 DIAGNOSIS — Z79890 Hormone replacement therapy (postmenopausal): Secondary | ICD-10-CM | POA: Diagnosis not present

## 2024-06-02 DIAGNOSIS — I129 Hypertensive chronic kidney disease with stage 1 through stage 4 chronic kidney disease, or unspecified chronic kidney disease: Secondary | ICD-10-CM | POA: Insufficient documentation

## 2024-06-02 DIAGNOSIS — K7581 Nonalcoholic steatohepatitis (NASH): Secondary | ICD-10-CM | POA: Diagnosis not present

## 2024-06-02 DIAGNOSIS — E7800 Pure hypercholesterolemia, unspecified: Secondary | ICD-10-CM | POA: Diagnosis not present

## 2024-06-02 DIAGNOSIS — K580 Irritable bowel syndrome with diarrhea: Secondary | ICD-10-CM | POA: Diagnosis not present

## 2024-06-02 DIAGNOSIS — F32A Depression, unspecified: Secondary | ICD-10-CM | POA: Diagnosis not present

## 2024-06-02 DIAGNOSIS — E039 Hypothyroidism, unspecified: Secondary | ICD-10-CM

## 2024-06-02 DIAGNOSIS — E038 Other specified hypothyroidism: Secondary | ICD-10-CM | POA: Insufficient documentation

## 2024-06-02 DIAGNOSIS — K219 Gastro-esophageal reflux disease without esophagitis: Secondary | ICD-10-CM | POA: Insufficient documentation

## 2024-06-02 DIAGNOSIS — I10 Essential (primary) hypertension: Secondary | ICD-10-CM | POA: Diagnosis not present

## 2024-06-02 DIAGNOSIS — Z79899 Other long term (current) drug therapy: Secondary | ICD-10-CM | POA: Insufficient documentation

## 2024-06-02 DIAGNOSIS — K760 Fatty (change of) liver, not elsewhere classified: Secondary | ICD-10-CM

## 2024-06-02 DIAGNOSIS — Z7984 Long term (current) use of oral hypoglycemic drugs: Secondary | ICD-10-CM | POA: Diagnosis not present

## 2024-06-02 HISTORY — PX: BALLOON DILATION: SHX5330

## 2024-06-02 HISTORY — DX: Sleep apnea, unspecified: G47.30

## 2024-06-02 HISTORY — PX: ESOPHAGOGASTRODUODENOSCOPY: SHX5428

## 2024-06-02 LAB — NASH FIBROSURE(R) PLUS
ALPHA 2-MACROGLOBULINS, QN: 179 mg/dL (ref 110–276)
ALT (SGPT) P5P: 35 IU/L (ref 0–40)
AST (SGOT) P5P: 45 IU/L — ABNORMAL HIGH (ref 0–40)
Apolipoprotein A-1: 125 mg/dL (ref 116–209)
Bilirubin, Total: 0.2 mg/dL (ref 0.0–1.2)
Cholesterol, Total: 126 mg/dL (ref 100–199)
Fibrosis Score: 0.19 (ref 0.00–0.21)
GGT: 129 IU/L — ABNORMAL HIGH (ref 0–60)
Glucose: 122 mg/dL — ABNORMAL HIGH (ref 70–99)
Haptoglobin: 150 mg/dL (ref 33–346)
NASH Score: 0.67 — ABNORMAL HIGH (ref 0.00–0.25)
Steatosis Score: 0.81 — ABNORMAL HIGH (ref 0.00–0.40)
Triglycerides: 169 mg/dL — ABNORMAL HIGH (ref 0–149)

## 2024-06-02 LAB — FIB-4 W/RX NASH FIBROSURE PLUS
ALT: 36 IU/L — ABNORMAL HIGH (ref 0–32)
AST: 38 IU/L (ref 0–40)
FIB-4 Index: 2.14 (ref 0.00–2.67)
Platelets: 166 10*3/uL (ref 150–450)

## 2024-06-02 LAB — GLUCOSE, CAPILLARY: Glucose-Capillary: 104 mg/dL — ABNORMAL HIGH (ref 70–99)

## 2024-06-02 SURGERY — EGD (ESOPHAGOGASTRODUODENOSCOPY)
Anesthesia: Monitor Anesthesia Care

## 2024-06-02 MED ORDER — BISOPROLOL FUMARATE 5 MG PO TABS
2.5000 mg | ORAL_TABLET | Freq: Every day | ORAL | 2 refills | Status: AC
Start: 2024-06-02 — End: ?

## 2024-06-02 MED ORDER — LIDOCAINE 2% (20 MG/ML) 5 ML SYRINGE
INTRAMUSCULAR | Status: DC | PRN
Start: 2024-06-02 — End: 2024-06-02
  Administered 2024-06-02: 80 mg via INTRAVENOUS

## 2024-06-02 MED ORDER — LISINOPRIL 10 MG PO TABS: 5.0000 mg | ORAL_TABLET | Freq: Every day | ORAL | Status: AC

## 2024-06-02 MED ORDER — PROPOFOL 10 MG/ML IV BOLUS
INTRAVENOUS | Status: AC
Start: 2024-06-02 — End: 2024-06-02
  Filled 2024-06-02: qty 20

## 2024-06-02 MED ORDER — SODIUM CHLORIDE 0.9 % IV SOLN
INTRAVENOUS | Status: AC | PRN
  Administered 2024-06-02: 500 mL via INTRAVENOUS

## 2024-06-02 MED ORDER — DEXMEDETOMIDINE HCL IN NACL 80 MCG/20ML IV SOLN
INTRAVENOUS | Status: DC | PRN
  Administered 2024-06-02: 4 ug via INTRAVENOUS

## 2024-06-02 MED ORDER — PROPOFOL 500 MG/50ML IV EMUL
INTRAVENOUS | Status: DC | PRN
  Administered 2024-06-02: 20 mg via INTRAVENOUS
  Administered 2024-06-02: 30 mg via INTRAVENOUS
  Administered 2024-06-02: 40 mg via INTRAVENOUS
  Administered 2024-06-02: 30 mg via INTRAVENOUS
  Administered 2024-06-02: 180 ug/kg/min via INTRAVENOUS
  Administered 2024-06-02: 30 mg via INTRAVENOUS

## 2024-06-02 MED ORDER — LACTATED RINGERS IV SOLN: INTRAVENOUS | Status: DC | PRN

## 2024-06-02 MED ORDER — PROPOFOL 10 MG/ML IV BOLUS
INTRAVENOUS | Status: AC
  Filled 2024-06-02: qty 20

## 2024-06-02 MED ORDER — ONDANSETRON HCL 4 MG/2ML IJ SOLN
INTRAMUSCULAR | Status: DC | PRN
  Administered 2024-06-02: 4 mg via INTRAVENOUS

## 2024-06-02 MED ORDER — SODIUM CHLORIDE 0.9 % IV SOLN: INTRAVENOUS | Status: DC

## 2024-06-02 NOTE — Anesthesia Postprocedure Evaluation (Signed)
 Anesthesia Post Note  Patient: [name]  Procedure(s) Performed: EGD (ESOPHAGOGASTRODUODENOSCOPY) BALLOON DILATION     Patient location during evaluation: Endoscopy Anesthesia Type: MAC Level of consciousness: awake and alert Pain management: pain level controlled Vital Signs Assessment: post-procedure vital signs reviewed and stable Respiratory status: spontaneous breathing, nonlabored ventilation, respiratory function stable and patient connected to nasal cannula oxygen Cardiovascular status: stable and blood pressure returned to baseline Postop Assessment: no apparent nausea or vomiting Anesthetic complications: no  No notable events documented.  Last Vitals:  Vitals:   06/02/24 0850 06/02/24 0900  BP: (!) 120/57 125/77  Pulse: 88 85  Resp: (!) 22 15  Temp: 36.7 C   SpO2: 99% 96%    Last Pain:  Vitals:   06/02/24 0900  TempSrc:   PainSc: 0-No pain                 Garnette DELENA Gab

## 2024-06-02 NOTE — Op Note (Signed)
 [facility] Patient Name: [name] Procedure Date: 06/02/2024 MRN: [id number] Attending MD: Lynnie Bring , MD, [id number] Date of Birth: 11/03/1966 CSN: [id number] Age: 58 Admit Type: Ambulatory Procedure:                Upper GI endoscopy Indications:              Dysphagia Providers:                Lynnie Bring, MD, Particia Fischer, RN, Fairy Marina, Technician Referring MD:              Medicines:                Monitored Anesthesia Care Complications:            No immediate complications. Estimated Blood Loss:     Estimated blood loss: none. Procedure:                Pre-Anesthesia Assessment:                           - Prior to the procedure, a History and Physical                            was performed, and patient medications and                            allergies were reviewed. [name] tolerance of                            previous anesthesia was also reviewed. The risks                            and benefits of the procedure and the sedation                            options and risks were discussed with the patient.                            All questions were answered, and informed consent                            was obtained. Prior Anticoagulants: The patient has                            taken no anticoagulant or antiplatelet agents. ASA                            Grade Assessment: III - A patient with severe                            systemic disease. After reviewing the risks and                            benefits,  the patient was deemed in satisfactory                            condition to undergo the procedure.                           After obtaining informed consent, the endoscope was                            passed under direct vision. Throughout the                            procedure, the patient's blood pressure, pulse, and                            oxygen saturations were monitored  continuously. The                            GIF-H190 ([id number]) Olympus endoscope was introduced                            through the mouth, and advanced to the second part                            of duodenum. The upper GI endoscopy was                            accomplished without difficulty. The patient                            tolerated the procedure well. Scope In: Scope Out: Findings:      One benign-appearing, intrinsic mild stenosis was found 36 cm from the       incisors. This stenosis measured 1.4 cm (inner diameter). The stenosis       was traversed. A TTS dilator was passed through the scope. Dilation with       an 18-19-20 mm balloon dilator was performed to 20 mm. Bx for EoE not       performed since prev bx were neg for EoE.      Localized, 2-3 white plaques were found at the gastroesophageal       junction. Biopsies were taken with a cold forceps for histology.      Localized minimal inflammation characterized by congestion (edema) and       erythema was found in the gastric antrum. Biopsies were taken with a       cold forceps for histology.      The examined duodenum was normal. Impression:               - Benign-appearing esophageal stenosis. Dilated.                           - Esophageal plaques were found, suspicious for                            candidiasis. Biopsied.                           -  Gastritis. Biopsied.                           - Normal examined duodenum. Moderate Sedation:      Not Applicable - Patient had care per Anesthesia. Recommendation:           - Patient has a contact number available for                            emergencies. The signs and symptoms of potential                            delayed complications were discussed with the                            patient. Return to normal activities tomorrow.                            Written discharge instructions were provided to the                            patient.                            - Resume previous diet.                           - Continue present medications.                           - Await pathology results.                           - The findings and recommendations were discussed                            with the patient's family. Procedure Code(s):        --- Professional ---                           [phone], Esophagogastroduodenoscopy, flexible,                            transoral; with transendoscopic balloon dilation of                            esophagus (less than 30 mm diameter)                           43239, 59, Esophagogastroduodenoscopy, flexible,                            transoral; with biopsy, single or multiple Diagnosis Code(s):        --- Professional ---                           K22.2, Esophageal obstruction  K22.9, Disease of esophagus, unspecified                           K29.70, Gastritis, unspecified, without bleeding                           R13.10, Dysphagia, unspecified CPT copyright 2022 [employer] Association. All rights reserved. The codes documented in this report are preliminary and upon coder review may  be revised to meet current compliance requirements. Lynnie Bring, MD 06/02/2024 8:47:02 AM This report has been signed electronically. Number of Addenda: 0

## 2024-06-02 NOTE — Transfer of Care (Signed)
 Immediate Anesthesia Transfer of Care Note  Patient: [name]  Procedure(s) Performed: EGD, WITH DILATION USING SAVARY-GILLIARD DILATOR OVER GUIDEWIRE  Patient Location: PACU and Endoscopy Unit  Anesthesia Type:MAC  Level of Consciousness: awake, drowsy, and patient cooperative  Airway & Oxygen Therapy: Patient Spontanous Breathing and Patient connected to face mask oxygen  Post-op Assessment: Report given to RN and Post -op Vital signs reviewed and stable  Post vital signs: Reviewed and stable  Last Vitals:  Vitals Value Taken Time  BP 116/58   Temp    Pulse 86 06/02/24 08:43  Resp 19 06/02/24 08:43  SpO2 100 % 06/02/24 08:43  Vitals shown include unfiled device data.  Last Pain:  Vitals:   06/02/24 0705  TempSrc: Temporal  PainSc: 0-No pain         Complications: No notable events documented.

## 2024-06-02 NOTE — Discharge Instructions (Signed)

## 2024-06-02 NOTE — Interval H&P Note (Signed)
 History and Physical Interval Note:  06/02/2024 8:12 AM  [name]  has presented today for surgery, with the diagnosis of gerd eso stricture MASLD.  The various methods of treatment have been discussed with the patient and family. After consideration of risks, benefits and other options for treatment, the patient has consented to  Procedure(s): EGD, WITH DILATION USING SAVARY-GILLIARD DILATOR OVER GUIDEWIRE (N/A) as a surgical intervention.  [name] history has been reviewed, patient examined, no change in status, stable for surgery.  I have reviewed the patient's chart and labs.  Questions were answered to the patient's satisfaction.     Lynnie Bring

## 2024-06-03 LAB — SURGICAL PATHOLOGY

## 2024-06-04 ENCOUNTER — Encounter (HOSPITAL_COMMUNITY): Payer: Self-pay | Admitting: Gastroenterology

## 2024-06-05 ENCOUNTER — Ambulatory Visit: Admitting: Podiatry

## 2024-06-05 DIAGNOSIS — L97522 Non-pressure chronic ulcer of other part of left foot with fat layer exposed: Secondary | ICD-10-CM | POA: Diagnosis not present

## 2024-06-05 DIAGNOSIS — E08621 Diabetes mellitus due to underlying condition with foot ulcer: Secondary | ICD-10-CM

## 2024-06-05 MED ORDER — SILVER SULFADIAZINE 1 % EX CREA: 1.0000 | TOPICAL_CREAM | Freq: Every day | CUTANEOUS | 0 refills | Status: AC

## 2024-06-05 NOTE — Progress Notes (Unsigned)
 Took a Hunk of skin out last friday

## 2024-06-10 ENCOUNTER — Other Ambulatory Visit: Payer: Self-pay

## 2024-06-10 ENCOUNTER — Ambulatory Visit: Payer: Self-pay | Admitting: Gastroenterology

## 2024-06-10 DIAGNOSIS — B3781 Candidal esophagitis: Secondary | ICD-10-CM

## 2024-06-10 MED ORDER — FLUCONAZOLE 100 MG PO TABS: 200.0000 mg | ORAL_TABLET | Freq: Every day | ORAL | 0 refills | Status: AC

## 2024-06-10 NOTE — Progress Notes (Signed)
 [name]'s eso Bx-mild Candida esophagitis  Diflucan 400 mg p.o. x 1 followed by 200 mg p.o. daily x 14 days Hold atorvastatin  while taking diflucan Can decrease cymbalata to every other day while taking Diflucan.

## 2024-06-22 ENCOUNTER — Ambulatory Visit (INDEPENDENT_AMBULATORY_CARE_PROVIDER_SITE_OTHER)

## 2024-06-22 ENCOUNTER — Ambulatory Visit: Admitting: Podiatry

## 2024-06-22 VITALS — Ht 64.5 in | Wt 274.5 lb

## 2024-06-22 DIAGNOSIS — M7752 Other enthesopathy of left foot: Secondary | ICD-10-CM | POA: Diagnosis not present

## 2024-06-22 DIAGNOSIS — E08621 Diabetes mellitus due to underlying condition with foot ulcer: Secondary | ICD-10-CM

## 2024-06-22 DIAGNOSIS — L97522 Non-pressure chronic ulcer of other part of left foot with fat layer exposed: Secondary | ICD-10-CM

## 2024-06-24 ENCOUNTER — Other Ambulatory Visit: Payer: Self-pay | Admitting: Gastroenterology

## 2024-06-24 NOTE — Progress Notes (Signed)
 Subjective: Chief Complaint  Patient presents with   Foot Ulcer    RM 13 Patient is here for ulcer on the left hallux.Patient is currently using silvadene  cream and changing the dressing each night. Wound has some scabbing, is open with some drainage (no foul odor). Patient lost nail on the left  hallux.    58 year old female presents the office today for follow-up evaluation of a wound on her left foot, skin fissure.  She denies any drainage or pus or increasing swelling or redness.  She does not report any fevers or chills.  She is also having pain to the ankle joint itself.  She does not recall any recent falls or injuries.  She points globally around the ankle where she gets the discomfort.   Last A1c was 6.5 on 05/25/2024  ROS: Negative except for otherwise mentioned above  Objective: AAO x3, NAD DP/PT pulses palpable bilaterally, CRT less than 3 seconds  Sensation decreased with SWMF LEFT: Today is a thick hyperkeratotic lesion.  There is a new wound just adjacent to where the skin fissures are.  The skin fissure that we have been treating with that wound appears to be healing well.  There is a central granular wound present measuring after debridement as the wound was not large measuring 1.5 x 0.8 x 0.2 cm.  There is increased hyperkeratotic tissue on the periphery of the wound which I debrided today.  There is no probing to bone, undermining or tunneling.  No exposed bone or tendon.  There is tenderness globally around the ankle joint itself.  There is no erythema or warmth to the send there is no pain with range of motion.  No pain with calf compression, swelling, warmth, erythema bilaterally  Assessment: Left hallux ulceration, arthritis ankle  Plan: Ulcer left foot - Medically necessary wound debridement was performed today.  She has debrided the ulceration utilizing a #312 blade scalpel to help remove all nonviable, devitalized tissue to the plantar healing.  There is minimal  blood loss.  Hemostasis achieved with manual compression.  Antibiotic ointment was applied followed by dressing.  Continue with daily dressing changes, offloading. She was placed into a CAM boot today.   Ankle pain -Likely due to arthritis, foot structure and mechanical issues.  Recommend elevation and cam boot today.  Radiology X-rays were obtained reviewed of the foot, ankle and the left side.  Multiple views were obtained.  Arthritic is present ankle with ossicle noted along the medial malleolus.  Arthritis in the rear foot.  With attention to the hallux there is no definitive cortical changes there changes compared to prior x-rays noted that would be consistent with osteomyelitis at this time.  No soft tissue emphysema.  Return in about 1 week (around 06/29/2024) for ulcer, ankle pain.  Donnice JONELLE Fees DPM

## 2024-06-29 ENCOUNTER — Ambulatory Visit: Admitting: Podiatry

## 2024-06-29 ENCOUNTER — Ambulatory Visit: Payer: Self-pay | Admitting: Podiatry

## 2024-06-29 ENCOUNTER — Ambulatory Visit (HOSPITAL_COMMUNITY)
Admission: RE | Admit: 2024-06-29 | Discharge: 2024-06-29 | Disposition: A | Discharge status: Home / Self Care | Source: Ambulatory Visit | Attending: Podiatry | Admitting: Podiatry

## 2024-06-29 ENCOUNTER — Encounter: Payer: Self-pay | Admitting: Podiatry

## 2024-06-29 DIAGNOSIS — L97522 Non-pressure chronic ulcer of other part of left foot with fat layer exposed: Secondary | ICD-10-CM

## 2024-06-29 DIAGNOSIS — E08621 Diabetes mellitus due to underlying condition with foot ulcer: Secondary | ICD-10-CM | POA: Diagnosis not present

## 2024-06-29 DIAGNOSIS — M7989 Other specified soft tissue disorders: Secondary | ICD-10-CM | POA: Insufficient documentation

## 2024-06-29 DIAGNOSIS — M7752 Other enthesopathy of left foot: Secondary | ICD-10-CM

## 2024-06-29 MED ORDER — DOXYCYCLINE HYCLATE 100 MG PO TABS: 100.0000 mg | ORAL_TABLET | Freq: Two times a day (BID) | ORAL | 0 refills | Status: DC

## 2024-07-01 NOTE — Progress Notes (Signed)
 Subjective: Chief Complaint  Patient presents with   Foot Pain    ulcer, ankle pain. 50% better. 6 pain. Wearing pneumatic cast. Left foot great toe plantar ulcer. IDDM A1C 56.35.    58 year old female presents the office today for follow-up evaluation of a wound on her left foot, skin fissure.  Succus and bloody drainage but no purulence.  She is open using the cam boot.  No creasing swelling or redness.  No fevers or chills.   She states that she has noticed increased swelling to her left leg.  She still has ongoing discomfort of the ankle which is been a chronic issue.   Last A1c was 6.5 on 05/25/2024  ROS: Negative except for otherwise mentioned above  Objective: AAO x3, NAD DP/PT pulses palpable bilaterally, CRT less than 3 seconds  Sensation decreased with SWMF LEFT: Today is a thick hyperkeratotic lesion.  There is a new wound just adjacent to where the skin fissures are.  The skin fissure that we have been treating with that wound appears to be healing well.  There is a central granular wound present measuring after debridement  it was measuring 1.4 x 0.7 x 0.2 cm.  There is hyperkeratotic tissue on the periphery of the wound which I debrided today.  There is no probing to bone, undermining or tunneling.  No exposed bone or tendon.  There is tenderness globally around the ankle joint itself.  There is no erythema or warmth to the send there is no pain with range of motion.  No pain with calf compression, erythema or warmth.  There is increased edema present to the left calf however it is supple.    Assessment: Left hallux ulceration, arthritis ankle, swelling left leg  Plan: Ulcer left foot - Medically necessary wound debridement was performed today.  She has debrided the ulceration utilizing a #312 blade scalpel to help remove all nonviable, devitalized tissue to the plantar healing.  There is minimal blood loss.  Hemostasis achieved with manual compression.  Antibiotic  ointment was applied followed by dressing.  Continue with daily dressing changes, offloading. She was placed into a CAM boot today.   Ankle pain -Likely due to arthritis, foot structure and mechanical issues.  Recommend elevation and cam boot today.  Consider injection once the wound is healed.  Left leg swelling - DVT study ordered-scheduled for later today.  Return for ulcer in 2-3 weeks.  [name] DPM

## 2024-07-08 ENCOUNTER — Other Ambulatory Visit: Payer: Self-pay | Admitting: Podiatry

## 2024-07-23 ENCOUNTER — Ambulatory Visit: Admitting: Podiatry

## 2024-07-27 ENCOUNTER — Ambulatory Visit: Admitting: Podiatry

## 2024-07-27 DIAGNOSIS — M7989 Other specified soft tissue disorders: Secondary | ICD-10-CM

## 2024-07-27 DIAGNOSIS — L97522 Non-pressure chronic ulcer of other part of left foot with fat layer exposed: Secondary | ICD-10-CM

## 2024-07-27 DIAGNOSIS — M7752 Other enthesopathy of left foot: Secondary | ICD-10-CM | POA: Diagnosis not present

## 2024-07-27 NOTE — Progress Notes (Unsigned)
 Subjective: Chief Complaint  Patient presents with   Foot Ulcer    Left foot ulcer follow up pt stated that it is doing okay no concerns at this time      58 year old female presents the office today for follow-up evaluation of a wound on her left foot, skin fissure.  She is been keeping ointment on the toe daily.  She does not report any drainage or pus but she does get some bloody drainage on the bandage.  No recent swelling or redness.  Has been using the cam boot which is now in the toe but also states that makes her ankle feel better.  No recent injuries or changes otherwise.  Denies any fevers or chills.   Last A1c was 6.5 on 05/25/2024  ROS: Negative except for otherwise mentioned above  Objective: AAO x3, NAD DP/PT pulses palpable bilaterally, CRT less than 3 seconds  Sensation decreased with SWMF LEFT: Today is a granular wound which measures the same in size measuring 1.4 x 0.7 cm but appears to be filling in more and more granulation tissue noted.  There are still hyperkeratotic periwound but appears to be lessening today.  There is no probing, undermining or tunneling.  There is no drainage or pus.  There is no fluctuation or crepitation.  There is no malodor.  There is no area of tenderness otherwise of the foot or ankle today. Swelling improved to the lower extremity.  There is no pain with calf compression, erythema or warmth.  Assessment: Left hallux ulceration, arthritis ankle, improved swelling left lower extremity  Plan: Ulcer left foot - Medically necessary wound debridement was performed today.  She has debrided the ulceration utilizing a #312 blade scalpel to help remove all nonviable, devitalized tissue to the plantar healing.  There is minimal blood loss.  Hemostasis achieved with manual compression.  Pre and post measurements were the same and noted above.  Prisma was applied followed by dressing.  With every other day dressing changes unless there is drainage that  she can change it daily.  Continue with daily dressing changes, offloading. She was placed into a CAM boot today.   Ankle pain -Likely due to arthritis, foot structure and mechanical issues.  Continue cam boot for now.  Left leg swelling - DVT study well.  Swelling is improved. Elevation.   [name] Fees DPM

## 2024-08-07 ENCOUNTER — Other Ambulatory Visit: Payer: Self-pay | Admitting: Gastroenterology

## 2024-08-07 NOTE — Telephone Encounter (Signed)
 Refills sent

## 2024-08-17 ENCOUNTER — Ambulatory Visit: Admitting: Podiatry

## 2024-08-17 DIAGNOSIS — E08621 Diabetes mellitus due to underlying condition with foot ulcer: Secondary | ICD-10-CM

## 2024-08-17 DIAGNOSIS — L97522 Non-pressure chronic ulcer of other part of left foot with fat layer exposed: Secondary | ICD-10-CM | POA: Diagnosis not present

## 2024-08-17 DIAGNOSIS — M2022 Hallux rigidus, left foot: Secondary | ICD-10-CM

## 2024-08-18 NOTE — Progress Notes (Signed)
 Subjective: Chief Complaint  Patient presents with   Wound Check    Left foot toe ulcer follow up  Pt stated that she does not feel like it is where it needs to be at this point       58 year old female presents the office today for follow-up evaluation of a wound on her left foot.  She states that still at the same she still gets some bloody drainage on the bandage but no purulence.  No present swelling or redness.  She does not report any fevers or chills.  She is continue with daily dressing changes and she has been wearing the cam boot.   Last A1c was 6.5 on 05/25/2024  ROS: Negative except for otherwise mentioned above  Objective: AAO x3, NAD DP/PT pulses palpable bilaterally, CRT less than 3 seconds  Sensation decreased with SWMF LEFT: Today is a granular wound which measures the same in size measuring 1.5 x 0.7 x 0.2 cm and appears about the same.  There is hyperkeratotic periwound.  There is no probing, undermining or tunneling.  No fluctuation or crepitation.  There is no malodor. Decreased range of motion of the hallux and dorsiflexion at the MPJ. There is no pain with calf compression, erythema or warmth.  Assessment: Left hallux ulceration  Plan: Ulcer left foot - Medically necessary wound debridement was performed today.  She has debrided the ulceration utilizing a #312 blade scalpel to help remove all nonviable, devitalized tissue to the plantar healing.  There is minimal blood loss.  I have the hyperkeratotic periwound.  Silvadene  was applied followed by dressing.  Continue daily dressing changes.   With every other day dressing changes unless there is drainage that she can change it daily.  Continue with daily dressing changes, offloading. She was placed into a CAM boot today.   Hallux rigidus - I do think a lot of her symptoms and the result of the wound not healing is because limited dorsiflexion of the hallux.  She has been offloading the cam boot.  I dispensed  various offloading pads, buttress pads today to try to help.  We discussed if needed consider possible surgical intervention which may include a Keller arthroplasty.  Ideally I like the wound to heal but if it is no signs of healing may need to consider more advanced treatment options to get the wound to heal and how prevent reoccurrence and try to save the toe.  Unfortunately she is at risk of amputation of the toe which she is well aware of.  Donnice JONELLE Fees DPM

## 2024-09-07 ENCOUNTER — Encounter: Payer: Self-pay | Admitting: Podiatry

## 2024-09-07 ENCOUNTER — Ambulatory Visit: Admitting: Podiatry

## 2024-09-07 DIAGNOSIS — E08621 Diabetes mellitus due to underlying condition with foot ulcer: Secondary | ICD-10-CM

## 2024-09-07 DIAGNOSIS — L97522 Non-pressure chronic ulcer of other part of left foot with fat layer exposed: Secondary | ICD-10-CM

## 2024-09-08 NOTE — Progress Notes (Signed)
 Subjective: Chief Complaint  Patient presents with   Diabetic Ulcer    Rm12 f/u left big toe/   58 year old female presents the office today for follow-up evaluation of a wound on her left foot.  She has been using Silvadene  on the wound and she also uses an over-the-counter rosebud oil around the wound to help with softer on the callus area.  She does not report any pus.  Some bloody drainage.  No increasing swelling or redness.  Denies any fevers or chills.  Last A1c was 6.5 on 05/25/2024  ROS: Negative except for otherwise mentioned above  Objective: AAO x3, NAD DP/PT pulses palpable bilaterally, CRT less than 3 seconds  Sensation decreased with SWMF LEFT: Today is a granular wound which measures the same in size measuring 1.4 x 0.6 x 0.1 cm and appears to be more superficial today.  There is hyperkeratotic periwound.  There is no probing, undermining or tunneling.  No fluctuation or crepitation.  There is no malodor. Decreased range of motion of the hallux and dorsiflexion at the MPJ. There is no pain with calf compression, erythema or warmth.  Assessment: Left hallux ulceration  Plan: Ulcer left foot - I did sharply debride the minimal hyperkeratotic tissue on the periphery of the wound today.  Continue with Silvadene  dressing changes daily and offloading.  Dispensed her a buttress pad which I made to help offload.  Remain in surgical shoe.  Continue daily dressing changes, offloading. -Monitor for any clinical signs or symptoms of infection and directed to call the office immediately should any occur or go to the ER.  Return in about 3 weeks (around 09/28/2024) for ulcer.  [name] DPM

## 2024-09-28 ENCOUNTER — Ambulatory Visit: Admitting: Podiatry

## 2024-09-28 ENCOUNTER — Ambulatory Visit (INDEPENDENT_AMBULATORY_CARE_PROVIDER_SITE_OTHER)

## 2024-09-28 DIAGNOSIS — L97522 Non-pressure chronic ulcer of other part of left foot with fat layer exposed: Secondary | ICD-10-CM

## 2024-09-28 DIAGNOSIS — E08621 Diabetes mellitus due to underlying condition with foot ulcer: Secondary | ICD-10-CM | POA: Diagnosis not present

## 2024-09-28 MED ORDER — SULFAMETHOXAZOLE-TRIMETHOPRIM 800-160 MG PO TABS: 1.0000 | ORAL_TABLET | Freq: Two times a day (BID) | ORAL | 0 refills | Status: DC

## 2024-10-08 ENCOUNTER — Encounter: Payer: Self-pay | Admitting: Podiatry

## 2024-10-08 ENCOUNTER — Ambulatory Visit: Admitting: Podiatry

## 2024-10-08 ENCOUNTER — Ambulatory Visit (HOSPITAL_COMMUNITY)
Admission: RE | Admit: 2024-10-08 | Discharge: 2024-10-08 | Disposition: A | Discharge status: Home / Self Care | Source: Ambulatory Visit | Attending: Podiatry | Admitting: Podiatry

## 2024-10-08 DIAGNOSIS — E08621 Diabetes mellitus due to underlying condition with foot ulcer: Secondary | ICD-10-CM

## 2024-10-08 DIAGNOSIS — M2022 Hallux rigidus, left foot: Secondary | ICD-10-CM

## 2024-10-08 DIAGNOSIS — L97522 Non-pressure chronic ulcer of other part of left foot with fat layer exposed: Secondary | ICD-10-CM | POA: Diagnosis present

## 2024-10-09 ENCOUNTER — Ambulatory Visit: Payer: Self-pay | Admitting: Podiatry

## 2024-10-09 LAB — VAS US PAD ABI
Left ABI: 1.02
Right ABI: 1.21

## 2024-10-09 NOTE — Progress Notes (Signed)
 Subjective: Chief Complaint  Patient presents with   Foot Ulcer    Diabetic ulcer of toe of left foot associated with diabetes mellitus due to underlying condition, with fat layer exposed (HCC)      58 year old female presents the office today for follow-up evaluation of a wound on her left foot.  She is to continue with daily dressing changes.  She does not report any fevers or chills or increase in swelling.  She finished a course of antibiotics.  No pain although she does have neuropathy.  No other concerns today.   Last A1c was 6.5 on 05/25/2024  ROS: Negative except for otherwise mentioned above  Objective: AAO x3, NAD DP/PT pulses palpable bilaterally, CRT less than 3 seconds  Sensation decreased with SWMF LEFT: Today is a granular wound which measures the same in size measuring 1.4 x 0.6 x 0.2 cm and appears to be the same in size examined it but it has filled in the periphery is more superficial of the skin.  There is no probing to bone, undermining or tunneling.  There is no surrounding erythema, ascending cellulitis.  No fluctuation or crepitation but there is decreased edema present of the toe.  Decreased dorsiflexion of the first MTPJ There is no pain with calf compression, erythema or warmth.  Assessment: Left hallux ulceration- chronic  hallux limitus  Plan: Ulcer left foot - I did sharply debride the minimal hyperkeratotic tissue on the periphery of the wound today.  Pre and post debridement measurements of the same.  Reports when she is a Hydrofera Blue I gave her some to take home but not apply this today as she does not be going for circulation test after this appointment today.  Continue offloading the cam boot.  If symptoms persist may need to consider surgical intervention to help facilitate healing and help prevent amputation. -Monitor for any clinical signs or symptoms of infection and directed to call the office immediately should any occur or go to the  ER.  Return in about 2 weeks (around 10/22/2024) for toe ulcer.  [name] DPM

## 2024-10-21 ENCOUNTER — Other Ambulatory Visit: Payer: Self-pay

## 2024-10-23 ENCOUNTER — Ambulatory Visit: Admitting: Podiatry

## 2024-10-23 ENCOUNTER — Encounter: Payer: Self-pay | Admitting: Podiatry

## 2024-10-23 DIAGNOSIS — L97522 Non-pressure chronic ulcer of other part of left foot with fat layer exposed: Secondary | ICD-10-CM

## 2024-10-23 DIAGNOSIS — E08621 Diabetes mellitus due to underlying condition with foot ulcer: Secondary | ICD-10-CM

## 2024-10-23 MED ORDER — CLONIDINE 0.1 MG/24HR TD PTWK: 0.1000 mg | MEDICATED_PATCH | TRANSDERMAL | 1 refills | Status: AC

## 2024-10-26 NOTE — Progress Notes (Signed)
 Subjective: Chief Complaint  Patient presents with   Foot Ulcer    Left foot plantar great toe ulcer. Daily dressing changes. Wearing pneumatic cast. 0 pain. IDDM A33C 71.109.   58 year old female presents the office today for follow-up evaluation of a wound on her left foot.  She has not seen any increase in swelling or redness but she has noticed some increased drainage.  She does not report any fevers or chills.    Last A1c was 6.5 on 05/25/2024  ROS: Negative except for otherwise mentioned above  Objective: AAO x3, NAD-presents walking in cam boot. DP/PT pulses palpable bilaterally, CRT less than 3 seconds  Sensation decreased with SWMF LEFT: Today is a granular wound which measures the same in size measuring 1.2 x 0.6 x 0.2 cm and appears to be slightly smaller compared to prior.  Wound base is healthy with 100% granulation tissue present.  There is no purulence noted today.  Some minimal edema present to the hallux without any ascending cellulitis.  No fluctuation or crepitation.  There is no malodor.  Decreased dorsiflexion of the first MTPJ There is no pain with calf compression, erythema or warmth.  Assessment: Left hallux ulceration- chronic  hallux limitus  Plan: Ulcer left foot - Sharply debrided the ulceration today with a #312 with scalpel down to healthy, bleeding tissue.  Pre and post debridement measurements of the same.  Wound continue Hydrofera Blue.  Discussed daily dressing changes, offloading. -Monitor for any clinical signs or symptoms of infection and directed to call the office immediately should any occur or go to the ER.  Return for toe ulcer in 2-3 weeks.   [name] DPM

## 2024-10-29 ENCOUNTER — Encounter: Payer: Self-pay | Admitting: Podiatry

## 2024-10-29 ENCOUNTER — Other Ambulatory Visit: Payer: Self-pay | Admitting: Podiatry

## 2024-10-29 MED ORDER — DOXYCYCLINE HYCLATE 100 MG PO TABS: 100.0000 mg | ORAL_TABLET | Freq: Two times a day (BID) | ORAL | 0 refills | Status: DC

## 2024-11-09 ENCOUNTER — Ambulatory Visit: Admitting: Podiatry

## 2024-11-09 ENCOUNTER — Other Ambulatory Visit: Payer: Self-pay

## 2024-11-09 ENCOUNTER — Ambulatory Visit (INDEPENDENT_AMBULATORY_CARE_PROVIDER_SITE_OTHER)

## 2024-11-09 DIAGNOSIS — L97522 Non-pressure chronic ulcer of other part of left foot with fat layer exposed: Secondary | ICD-10-CM | POA: Diagnosis not present

## 2024-11-09 DIAGNOSIS — E08621 Diabetes mellitus due to underlying condition with foot ulcer: Secondary | ICD-10-CM

## 2024-11-09 DIAGNOSIS — M86172 Other acute osteomyelitis, left ankle and foot: Secondary | ICD-10-CM

## 2024-11-09 DIAGNOSIS — M2022 Hallux rigidus, left foot: Secondary | ICD-10-CM | POA: Diagnosis not present

## 2024-11-09 MED ORDER — PANTOPRAZOLE SODIUM 40 MG PO TBEC: 40.0000 mg | DELAYED_RELEASE_TABLET | Freq: Two times a day (BID) | ORAL | 0 refills | Status: DC

## 2024-11-09 MED ORDER — DOXYCYCLINE HYCLATE 100 MG PO TABS: 100.0000 mg | ORAL_TABLET | Freq: Two times a day (BID) | ORAL | 0 refills | Status: DC

## 2024-11-10 NOTE — Progress Notes (Signed)
 Subjective: Chief Complaint  Patient presents with   Foot Ulcer    Left foot great toe ulcer follow up pt stated that there hasn't been as much drainage     58 year old female presents the office today for follow-up evaluation of a wound on her left foot.  She states that she still been some drainage from the area.  She started using the collagen dressing again yesterday.  She is concerned that the callus buildup around the wound.  Denies any fevers or chills.   Last A1c was 6.5 on 05/25/2024  ROS: Negative except for otherwise mentioned above  Objective: AAO x3, NAD-presents walking in cam boot. DP/PT pulses palpable bilaterally, CRT less than 3 seconds  Sensation decreased with SWMF LEFT: Today is a granular wound which measures 1.5 x 0.6 x 0.2 cm. Wound base is healthy with 95% granulation tissue present about 5% fibrotic tissue.  There is no purulence noted today.  Some minimal edema present to the hallux without any ascending cellulitis.  No fluctuation or crepitation.  There is no malodor.  Decreased dorsiflexion of the first MTPJ There is no pain with calf compression, erythema or warmth.  Assessment: Left hallux ulceration- hallux limitus  Plan: Ulcer left foot - Sharply debrided the ulceration today with a #312 with scalpel down to healthy, bleeding tissue.  Pre and post debridement measurements of the same.  Wound continue Prisma dressing changes daily.  Discussed daily dressing changes, offloading.  Possible infection, hallux rigidus -We discussed surgical intervention for the wound given the nonhealing of this.  I have ordered MRI to further rule out any deeper infection. -Continue antibiotics. -Monitor for any clinical signs or symptoms of infection and directed to call the office immediately should any occur or go to the ER.  Radiology: X-rays were obtained reviewed.  Multiple views obtained.  There is no definitive cortical changes suggest osteomyelitis.  Increased  density noted along the distal phalanx which could correspond to the wound versus infection.  Return in about 2 weeks (around 11/23/2024) for toe ulcer, MRI results .  Donnice JONELLE Fees DPM

## 2024-11-19 ENCOUNTER — Inpatient Hospital Stay (INDEPENDENT_AMBULATORY_CARE_PROVIDER_SITE_OTHER): Admission: RE | Admit: 2024-11-19 | Discharge: 2024-11-19 | Attending: Podiatry | Admitting: Radiology

## 2024-11-19 DIAGNOSIS — M86172 Other acute osteomyelitis, left ankle and foot: Secondary | ICD-10-CM

## 2024-11-20 ENCOUNTER — Other Ambulatory Visit: Payer: Self-pay | Admitting: Podiatry

## 2024-11-20 ENCOUNTER — Ambulatory Visit: Payer: Self-pay | Admitting: Podiatry

## 2024-11-20 DIAGNOSIS — E08621 Diabetes mellitus due to underlying condition with foot ulcer: Secondary | ICD-10-CM

## 2024-11-21 LAB — CBC WITH DIFFERENTIAL/PLATELET
Basophils Absolute: 0 x10E3/uL (ref 0.0–0.2)
Basos: 0 %
EOS (ABSOLUTE): 0.1 x10E3/uL (ref 0.0–0.4)
Eos: 3 %
Hematocrit: 44.1 % (ref 34.0–46.6)
Hemoglobin: 14.4 g/dL (ref 11.1–15.9)
Immature Grans (Abs): 0 x10E3/uL (ref 0.0–0.1)
Immature Granulocytes: 0 %
Lymphocytes Absolute: 1.1 x10E3/uL (ref 0.7–3.1)
Lymphs: 26 %
MCH: 29.3 pg (ref 26.6–33.0)
MCHC: 32.7 g/dL (ref 31.5–35.7)
MCV: 90 fL (ref 79–97)
Monocytes Absolute: 0.5 x10E3/uL (ref 0.1–0.9)
Monocytes: 11 %
Neutrophils Absolute: 2.4 x10E3/uL (ref 1.4–7.0)
Neutrophils: 60 %
Platelets: 195 x10E3/uL (ref 150–450)
RBC: 4.92 x10E6/uL (ref 3.77–5.28)
RDW: 13.6 % (ref 11.7–15.4)
WBC: 4 x10E3/uL (ref 3.4–10.8)

## 2024-11-21 LAB — BASIC METABOLIC PANEL WITH GFR
BUN/Creatinine Ratio: 20 (ref 9–23)
BUN: 18 mg/dL (ref 6–24)
CO2: 25 mmol/L (ref 20–29)
Calcium: 9.2 mg/dL (ref 8.7–10.2)
Chloride: 97 mmol/L (ref 96–106)
Creatinine, Ser: 0.89 mg/dL (ref 0.57–1.00)
Glucose: 101 mg/dL — ABNORMAL HIGH (ref 70–99)
Potassium: 4.2 mmol/L (ref 3.5–5.2)
Sodium: 138 mmol/L (ref 134–144)
eGFR: 76 mL/min/1.73 (ref >59)

## 2024-11-21 LAB — SEDIMENTATION RATE: Sed Rate: 78 mm/h — ABNORMAL HIGH (ref 0–40)

## 2024-11-21 LAB — C-REACTIVE PROTEIN: CRP: <1 mg/L (ref 0–10)

## 2024-11-23 ENCOUNTER — Ambulatory Visit: Payer: Self-pay | Admitting: Podiatry

## 2024-11-26 ENCOUNTER — Ambulatory Visit: Admitting: Gastroenterology

## 2024-11-26 ENCOUNTER — Other Ambulatory Visit

## 2024-11-26 VITALS — BP 102/64 | HR 88 | Ht 66.0 in | Wt 261.4 lb

## 2024-11-26 DIAGNOSIS — R110 Nausea: Secondary | ICD-10-CM | POA: Diagnosis not present

## 2024-11-26 DIAGNOSIS — R1319 Other dysphagia: Secondary | ICD-10-CM | POA: Diagnosis not present

## 2024-11-26 DIAGNOSIS — Z860100 Personal history of colon polyps, unspecified: Secondary | ICD-10-CM

## 2024-11-26 DIAGNOSIS — K582 Mixed irritable bowel syndrome: Secondary | ICD-10-CM

## 2024-11-26 DIAGNOSIS — K219 Gastro-esophageal reflux disease without esophagitis: Secondary | ICD-10-CM

## 2024-11-26 DIAGNOSIS — K760 Fatty (change of) liver, not elsewhere classified: Secondary | ICD-10-CM | POA: Diagnosis not present

## 2024-11-26 DIAGNOSIS — Z1211 Encounter for screening for malignant neoplasm of colon: Secondary | ICD-10-CM

## 2024-11-26 LAB — CBC WITH DIFFERENTIAL/PLATELET
Basophils Absolute: 0 K/uL (ref 0.0–0.1)
Basophils Relative: 0.8 % (ref 0.0–3.0)
Eosinophils Absolute: 0.1 K/uL (ref 0.0–0.7)
Eosinophils Relative: 2.8 % (ref 0.0–5.0)
HCT: 39.9 % (ref 36.0–46.0)
Hemoglobin: 13.4 g/dL (ref 12.0–15.0)
Lymphocytes Relative: 28.1 % (ref 12.0–46.0)
Lymphs Abs: 0.7 K/uL (ref 0.7–4.0)
MCHC: 33.7 g/dL (ref 30.0–36.0)
MCV: 87.1 fl (ref 78.0–100.0)
Monocytes Absolute: 0.4 K/uL (ref 0.1–1.0)
Monocytes Relative: 15.3 % — ABNORMAL HIGH (ref 3.0–12.0)
Neutro Abs: 1.4 K/uL (ref 1.4–7.7)
Neutrophils Relative %: 53 % (ref 43.0–77.0)
Platelets: 162 K/uL (ref 150.0–400.0)
RBC: 4.58 Mil/uL (ref 3.87–5.11)
RDW: 14.6 % (ref 11.5–15.5)
WBC: 2.6 K/uL — ABNORMAL LOW (ref 4.0–10.5)

## 2024-11-26 LAB — HEPATIC FUNCTION PANEL
ALT: 92 U/L — ABNORMAL HIGH (ref 3–35)
AST: 107 U/L — ABNORMAL HIGH (ref 5–37)
Albumin: 3.5 g/dL (ref 3.5–5.2)
Alkaline Phosphatase: 105 U/L (ref 39–117)
Bilirubin, Direct: 0.2 mg/dL (ref 0.1–0.3)
Total Bilirubin: 0.3 mg/dL (ref 0.2–1.2)
Total Protein: 9.6 g/dL — ABNORMAL HIGH (ref 6.0–8.3)

## 2024-11-26 LAB — PROTIME-INR
INR: 1.1 ratio — ABNORMAL HIGH (ref 0.8–1.0)
Prothrombin Time: 12 s (ref 9.6–13.1)

## 2024-11-26 MED ORDER — PANTOPRAZOLE SODIUM 40 MG PO TBEC: 40.0000 mg | DELAYED_RELEASE_TABLET | Freq: Two times a day (BID) | ORAL | 0 refills | Status: AC

## 2024-11-26 MED ORDER — HYOSCYAMINE SULFATE 0.125 MG SL SUBL: SUBLINGUAL_TABLET | SUBLINGUAL | 8 refills | Status: AC

## 2024-11-26 MED ORDER — ONDANSETRON 8 MG PO TBDP: 8.0000 mg | ORAL_TABLET | Freq: Three times a day (TID) | ORAL | 11 refills | Status: AC | PRN

## 2024-11-26 NOTE — Progress Notes (Signed)
 Chief Complaint:follow-up Primary GI Doctor:Dr. Charlanne  HPI: [name] is a 58 y.o. female  With PMH of DM2 req insulin , HTN, HLD, fatty liver, CIDP, fibromyalgia, dysautonomia, OSA on BIPAP, anxiety/depression, obesity, migraines thyroid  cancer s/p resection with secondary hypothyroidism.  Interval History Patient last seen in GI office on 6/1/625 by Dr. Charlanne. Patient presents for follow-up. Accompanied by her husband.  Patient reports after the dilatation in June she felt better for a month or two then symptoms of dysphagia returned. She reports she will eat and drink something and it feels lodged in throat and comes right back up. She completed the antifungal medication prescribed for esophageal candida. She does use breathing treatments with spacer. Reinforced oral hygiene.   Patient also has history of fatty liver.  She is on ozempic, reports appetite has decreased. She has lost 13lbs since June. She consumes small meals throughout the day. She takes ondansetron  prn for nausea.   She has had more constipation since being on Ozempic. She uses OTC miralax  about once a week and OTC metamucil gummies. She has BM about twice a week.    Wt Readings from Last 3 Encounters:  11/26/24 261 lb 6 oz (118.6 kg)  06/22/24 274 lb 8 oz (124.5 kg)  06/02/24 274 lb 8 oz (124.5 kg)    Past GI procedures: Neg cologuard 11/2021, rpt 3 yrs.  EGD 06/02/24  - Benign- appearing esophageal stenosis. Dilated. - Esophageal plaques were found, suspicious for candidiasis. Biopsied. - Gastritis. Biopsied. - Normal examined duodenum. Path: positive for candida esophagitis.  EGD 06/2018 -distal eso stricture s/p dil -Negative biopsies for EOE.  Negative small bowel biopsies previously -Negative H. pylori.   Colon 06/2018 -Polyps s/p polypectomy. Bx- TA.  Does not want another. -Negative random colon biopsies for microscopic colitis.   2DE 01/2021: Nl EF 60-65%   Past Medical History:  Diagnosis  Date   Anginal pain    chest pain  heart cath done   Anxiety    Cancer [facility])    thyroid  cancer   Chronic bronchitis (HCC)    get it q yr   Chronic inflammatory demyelinating polyneuropathy (HCC)    Chronic kidney disease    Proteinuria   Dyspnea    sob with exertion   Early cataract    right   Fibromyalgia    GERD (gastroesophageal reflux disease)    Heart murmur    Hepatic adenoma    High cholesterol    History of gout    History of hiatal hernia    History of kidney stones    Hypertension    Hypothyroidism    IBS (irritable bowel syndrome)    Iron deficiency anemia    comes and goes   Migraine    @ least once/month (02/18/2015)   Obesity    PONV (postoperative nausea and vomiting)    patch and anti nausea meds work well   Proteinuria    Sleep apnea    Spinal headache    Syncope    Tachycardia    Type II diabetes mellitus (HCC)    pt has an insulin  pump   Wears glasses     Past Surgical History:  Procedure Laterality Date   ABDOMINAL HYSTERECTOMY  ~2012   lap   ANKLE RECONSTRUCTION Left 03/29/2016   Procedure: LEFT ANKLE LATERAL LIGAMENT RECONSTRUCTION AND PERONEAL TENDON REPAIR OR TENOLYSIS;  Surgeon: Norleen Armor, MD;  Location: [facility] OR;  Service: Orthopedics;  Laterality: Left;   BACK  SURGERY     BALLOON DILATION N/A 06/02/2024   Procedure: BALLOON DILATION;  Surgeon: Charlanne Groom, MD;  Location: [facility];  Service: Gastroenterology;  Laterality: N/A;   CARDIAC CATHETERIZATION  04/2009   clean   COLONOSCOPY  01/12/2015   Colonic polyps status post polypectomy, small internal hemorrhoids. Otherwise normal. Bx: mild chronic gastritis.   CYSTOSCOPY WITH URETEROSCOPY, STONE BASKETRY AND STENT PLACEMENT  ~ 2006   ESOPHAGOGASTRODUODENOSCOPY  07/21/2015   Esophageal stricture status post esophageal dilatation. Mild gastritis.   ESOPHAGOGASTRODUODENOSCOPY N/A 06/02/2024   Procedure: EGD (ESOPHAGOGASTRODUODENOSCOPY);  Surgeon: Charlanne Groom, MD;   Location: [facility];  Service: Gastroenterology;  Laterality: N/A;   insulin  pump     U 500   IR IMAGING GUIDED PORT INSERTION  09/22/2018   IRRIGATION AND DEBRIDEMENT KNEE Left 04/03/2018   Procedure: Arthroscopic versus open irrigation and debridement  left knee;  Surgeon: Sharl Selinda Dover, MD;  Location: [facility] OR;  Service: Orthopedics;  Laterality: Left;   LAPAROSCOPIC CHOLECYSTECTOMY  05/2004   LUMBAR DISC SURGERY  08/2011   SURAL NERVE BX Left 09/30/2017   Procedure: LEFT SURAL NERVE AND MUSCLE BIOPSY;  Surgeon: Mavis Purchase, MD;  Location: [facility] OR;  Service: Neurosurgery;  Laterality: Left;  LEFT SURAL NERVE AND MUSCLE BIOPSY   THYROIDECTOMY N/A 02/18/2015   Procedure: TOTAL THYROIDECTOMY;  Surgeon: Krystal Spinner, MD;  Location: [facility];  Service: General;  Laterality: N/A;   TOTAL THYROIDECTOMY  02/18/2015    Allergies as of 11/26/2024 - Review Complete 11/26/2024  Allergen Reaction Noted   Latex Itching, Other (See Comments), and Rash 04/14/2009   Penicillins Anaphylaxis, Shortness Of Breath, Swelling, Palpitations, and Other (See Comments) 04/14/2009   Clarithromycin Nausea And Vomiting and Other (See Comments) 04/14/2009   Adhesive [tape] Itching and Other (See Comments) 02/15/2015   Moxifloxacin Nausea And Vomiting and Other (See Comments) 04/14/2009   Other Other (See Comments) 09/15/2018    Family History  Problem Relation Age of Onset   Hypertension Mother    Emphysema Mother    AAA (abdominal aortic aneurysm) Mother    Hypertension Father    Coronary artery disease Father    Cancer - Prostate Father    Renal Disease Father    Hypertension Brother    Colon cancer Neg Hx    Esophageal cancer Neg Hx    Liver cancer Neg Hx    Pancreatic cancer Neg Hx    Rectal cancer Neg Hx    Stomach cancer Neg Hx     Review of Systems:    Constitutional: No weight loss, fever, chills, weakness or fatigue HEENT: Eyes: No change in vision               Ears, Nose, Throat:  No  change in hearing or congestion Skin: No rash or itching Cardiovascular: No chest pain, chest pressure or palpitations   Respiratory: No SOB or cough Gastrointestinal: See HPI and otherwise negative Genitourinary: No dysuria or change in urinary frequency Neurological: No headache, dizziness or syncope Musculoskeletal: No new muscle or joint pain Hematologic: No bleeding or bruising Psychiatric: No history of depression or anxiety    Physical Exam:  Vital signs: BP 102/64 (BP Location: Left Arm, Patient Position: Sitting, Cuff Size: Normal)   Pulse 88   Ht 5' 6 (1.676 m)   Wt 261 lb 6 oz (118.6 kg)   BMI 42.19 kg/m   Constitutional:   Pleasant  female appears to be in NAD, Well developed, Well nourished, alert  and cooperative Eyes:   PEERL, EOMI. No icterus. Conjunctiva pink. Neck:  Supple Throat: Oral cavity and pharynx without inflammation, swelling or lesion.  Respiratory: Respirations even and unlabored. Lungs clear to auscultation bilaterally.   No wheezes, crackles, or rhonchi.  Cardiovascular: Normal S1, S2. Regular rate and rhythm. No peripheral edema, cyanosis or pallor.  Gastrointestinal:  Soft, nondistended, nontender. No rebound or guarding. Hypoactive bowel sounds. No appreciable masses or hepatomegaly. Rectal:  Not performed.  Msk:  Symmetrical without gross deformities. Without edema, no deformity or joint abnormality.  Neurologic:  Alert and  oriented x4;  grossly normal neurologically.  Skin:   Dry and intact without significant lesions or rashes.  RELEVANT LABS AND IMAGING: CBC    Latest Ref Rng & Units 11/20/2024    4:14 PM 05/25/2024    3:35 PM 05/25/2024    3:33 PM  CBC  WBC 3.4 - 10.8 x10E3/uL 4.0   2.6   Hemoglobin 11.1 - 15.9 g/dL 85.5   86.4   Hematocrit 34.0 - 46.6 % 44.1   40.1   Platelets 150 - 450 x10E3/uL 195  166  160.0      CMP     Latest Ref Rng & Units 11/20/2024    4:14 PM 05/25/2024    3:35 PM 05/25/2024    3:33 PM  CMP  Glucose  70 - 99 mg/dL 898   876   BUN 6 - 24 mg/dL 18   20   Creatinine 9.42 - 1.00 mg/dL 9.10   9.11   Sodium 865 - 144 mmol/L 138   133   Potassium 3.5 - 5.2 mmol/L 4.2   4.0   Chloride 96 - 106 mmol/L 97   98   CO2 20 - 29 mmol/L 25   28   Calcium  8.7 - 10.2 mg/dL 9.2   9.2   Total Protein 6.0 - 8.3 g/dL   9.3   Total Bilirubin 0.2 - 1.2 mg/dL   0.3   Alkaline Phos 39 - 117 U/L   88   AST 0 - 40 IU/L  38  36   ALT 0 - 32 IU/L  36  31     05/25/24 labs show:  PLT 182, PT-11.6 INR-1.1, AFP-1.4, Fib-4, HBA1c -6.5,Immune to hepatitis A and B   Imaging: 06/01/24 abd u/s IMPRESSION: 1. No acute abnormality identified. 2. Mild increased echotexture of the liver. This is nonspecific but can be seen in fatty infiltration of liver.  Assessment: Encounter Diagnoses  Name Primary?   Esophageal dysphagia    Gastroesophageal reflux disease, unspecified whether esophagitis present Yes   Irritable bowel syndrome with both constipation and diarrhea    Hepatic steatosis    Special screening for malignant neoplasms, colon    History of colonic polyps    Fatty liver    Metabolic dysfunction-associated steatotic liver disease (MASLD)    Chronic nausea    #1. H/O Colonic Polyps. S/P colon 06/2018, fair prep, Bx- TAs. Cologuard  12/22 negative.    #2. GERD with eso stricture s/p EGD with dil 20 mm TTS 05/2024. Bx positive for candid esophagitis.  #3 eso dysphagia, reoccurring s/p dilatation #4 Candida esophagitis, completed diflucan    #5. MASLD (Fatty Liver) on ozempic.  CT chest 04/2022 showing findings now suspicious for liver cirrhosis.  Has associated obesity, DM2, HLD. Dx 2001 on liver biopsy at [facility] showing stage I steatohepatitis.  H/O hepatic adenomas 1990s at [facility].  Being followed by serial ultrasounds. Neg autoimmune hepatitis  profile, AMA, ceruloplasmin, alpha-1 antitrypsin, p-ANCA, acute hepatitis profile 09/2017. LFTs normal 6/25. Normal AFP. Immune to hepatitis A and B. Fib4 score 2.14. NASH  score F0S2-S3. Moderate NASH.  #6. IBS-D, now with constipation since starting Ozempic. Neg random colonic bx for microscopic colitis 2008. She uses OTC Miralax  prn.   Plan: - check CBC, hepatic panel, PT/INR - Order abdominal u/s RUQ for HCC screening -Order barium esophagram  -Continue Hyoscamine 0.125 mg S/L 2 tabs before meals, refilled -Zofran  8mg  ODT Q8hrs prn, refilled -Miralax  17g po daily, titrate as needed -Continue Protonix  40mg  po bid, refilled -Encouraged continued weight loss on Ozempic. -Rpt cologuard 11/2024. -Would like to hold off on colonoscopy  Thank you for the courtesy of this consult. Please call me with any questions or concerns.   Denese Mentink, FNP-C [facility] 11/26/2024, 9:59 AM  Cc: Nichole Senior, MD

## 2024-11-26 NOTE — Patient Instructions (Signed)
 Your provider has requested that you go to the basement level for lab work before leaving today. Press B on the elevator. The lab is located at the first door on the left as you exit the elevator.  You have been scheduled for an abdominal ultrasound at [facility] (1st floor of hospital) on 11/30/24 at 11:15am. Please arrive 30 minutes prior to your appointment for registration. Make certain not to have anything to eat or drink after midnight prior to your appointment. Should you need to reschedule your appointment, please contact radiology at [phone]. This test typically takes about 30 minutes to perform.  You have been scheduled for a Barium Esophogram at [facility] (Entrance A of the hospital) on 12/02/24 at 9:00am. Please arrive 30 minutes prior to your appointment for registration. Make certain not to have anything to eat or drink after midnight prior to your test. If you need to reschedule for any reason, please contact radiology at [phone] to do so. __________________________________________________________________ A barium swallow is an examination that concentrates on views of the esophagus. This tends to be a double contrast exam (barium and two liquids which, when combined, create a gas to distend the wall of the oesophagus) or single contrast (non-ionic iodine  based). The study is usually tailored to your symptoms so a good history is essential. Attention is paid during the study to the form, structure and configuration of the esophagus, looking for functional disorders (such as aspiration, dysphagia, achalasia, motility and reflux) EXAMINATION You may be asked to change into a gown, depending on the type of swallow being performed. A radiologist and radiographer will perform the procedure. The radiologist will advise you of the type of contrast selected for your procedure and direct you during the exam. You will be asked to stand, sit or lie in several different  positions and to hold a small amount of fluid in your mouth before being asked to swallow while the imaging is performed .In some instances you may be asked to swallow barium coated marshmallows to assess the motility of a solid food bolus. The exam can be recorded as a digital or video fluoroscopy procedure. POST PROCEDURE It will take 1-2 days for the barium to pass through your system. To facilitate this, it is important, unless otherwise directed, to increase your fluids for the next 24-48hrs and to resume your normal diet.  This test typically takes about 30 minutes to perform. __________________________________________________________________________________  Your provider has ordered Cologuard testing as an option for colon cancer screening. This is performed by [employer] and may be out of network with your insurance. PRIOR to completing the test, it is YOUR responsibility to contact your insurance about covered benefits for this test. Your out of pocket expense could be anywhere from $0.00 to $649.00.   When you call to check coverage with your insurer, please provide the following information:   -The ONLY provider of Cologuard is [employer] Dispensing  - CPT code for Cologuard is [phone].  [employer] NPI # [id number]  -[employer] Tax ID # [id number]   We have already sent your demographic and insurance information to [employer] (phone number [phone]) and they should contact you within the next week regarding your test. If you have not heard from them within the next week, please call our office at [phone].  _______________________________________________________  If your blood pressure at your visit was 140/90 or greater, please contact your primary care physician to follow up on this.  _______________________________________________________  If you are age 54 or older, your body mass index should be between 23-30. Your Body  mass index is 42.19 kg/m. If this is out of the aforementioned range listed, please consider follow up with your Primary Care Provider.  If you are age 34 or younger, your body mass index should be between 19-25. Your Body mass index is 42.19 kg/m. If this is out of the aformentioned range listed, please consider follow up with your Primary Care Provider.   ________________________________________________________  The [facility] providers would like to encourage you to use MYCHART to communicate with providers for non-urgent requests or questions.  Due to long hold times on the telephone, sending your provider a message by [facility] may be a faster and more efficient way to get a response.  Please allow 48 business hours for a response.  Please remember that this is for non-urgent requests.  _______________________________________________________  [facility] is using a team-based approach to care.  Your team is made up of your doctor and two to three APPS. Our APPS (Nurse Practitioners and Physician Assistants) work with your physician to ensure care continuity for you. They are fully qualified to address your health concerns and develop a treatment plan. They communicate directly with your gastroenterologist to care for you. Seeing the Advanced Practice Practitioners on your physician's team can help you by facilitating care more promptly, often allowing for earlier appointments, access to diagnostic testing, procedures, and other specialty referrals.  Thank you for trusting me with your gastrointestinal care. Deanna May, FNP-C

## 2024-11-27 ENCOUNTER — Ambulatory Visit: Admitting: Podiatry

## 2024-11-27 DIAGNOSIS — E08621 Diabetes mellitus due to underlying condition with foot ulcer: Secondary | ICD-10-CM | POA: Diagnosis not present

## 2024-11-27 DIAGNOSIS — M86172 Other acute osteomyelitis, left ankle and foot: Secondary | ICD-10-CM | POA: Diagnosis not present

## 2024-11-27 DIAGNOSIS — L97522 Non-pressure chronic ulcer of other part of left foot with fat layer exposed: Secondary | ICD-10-CM

## 2024-11-30 ENCOUNTER — Ambulatory Visit: Payer: Self-pay | Admitting: Gastroenterology

## 2024-11-30 ENCOUNTER — Ambulatory Visit (INDEPENDENT_AMBULATORY_CARE_PROVIDER_SITE_OTHER)
Admission: RE | Admit: 2024-11-30 | Discharge: 2024-11-30 | Disposition: A | Discharge status: Home / Self Care | Source: Ambulatory Visit | Attending: Gastroenterology | Admitting: Gastroenterology

## 2024-11-30 DIAGNOSIS — K220 Achalasia of cardia: Secondary | ICD-10-CM

## 2024-11-30 DIAGNOSIS — R4702 Dysphasia: Secondary | ICD-10-CM

## 2024-11-30 DIAGNOSIS — R1319 Other dysphagia: Secondary | ICD-10-CM

## 2024-11-30 DIAGNOSIS — K224 Dyskinesia of esophagus: Secondary | ICD-10-CM

## 2024-11-30 DIAGNOSIS — Z1211 Encounter for screening for malignant neoplasm of colon: Secondary | ICD-10-CM

## 2024-11-30 DIAGNOSIS — Z9049 Acquired absence of other specified parts of digestive tract: Secondary | ICD-10-CM | POA: Diagnosis not present

## 2024-11-30 DIAGNOSIS — K760 Fatty (change of) liver, not elsewhere classified: Secondary | ICD-10-CM | POA: Diagnosis not present

## 2024-11-30 DIAGNOSIS — K582 Mixed irritable bowel syndrome: Secondary | ICD-10-CM

## 2024-11-30 DIAGNOSIS — K219 Gastro-esophageal reflux disease without esophagitis: Secondary | ICD-10-CM

## 2024-11-30 DIAGNOSIS — Z860100 Personal history of colon polyps, unspecified: Secondary | ICD-10-CM

## 2024-12-01 ENCOUNTER — Telehealth: Payer: Self-pay | Admitting: Podiatry

## 2024-12-01 ENCOUNTER — Ambulatory Visit: Admitting: Podiatry

## 2024-12-01 ENCOUNTER — Ambulatory Visit

## 2024-12-01 ENCOUNTER — Other Ambulatory Visit: Payer: Self-pay | Admitting: Podiatry

## 2024-12-01 DIAGNOSIS — M86172 Other acute osteomyelitis, left ankle and foot: Secondary | ICD-10-CM

## 2024-12-01 MED ORDER — DOXYCYCLINE HYCLATE 100 MG PO TABS: 100.0000 mg | ORAL_TABLET | Freq: Two times a day (BID) | ORAL | 0 refills | Status: AC

## 2024-12-01 NOTE — Progress Notes (Signed)
 Subjective: No chief complaint on file.     58 year old female presents the office today for bone biopsy left hallux.  She is continue with dressing changes daily as well as the cam boot.  Does not endorse any fevers or chills and she has no other concerns today.  Last A1c was 6.5 on 05/25/2024  ROS: Negative except for otherwise mentioned above  Objective: AAO x3, NAD-presents walking in cam boot. DP/PT pulses palpable bilaterally, CRT less than 3 seconds  Sensation decreased with SWMF LEFT: Today is a granular wound which measures 1.5 x 1 x 0.2 cm.  Wound appears to be more granular and there is no significant hyperkeratotic tissue along the periphery.  There are some trace edema present of the toe but is no erythema or warmth today.  There is no fluctuation or crepitation.  There is no malodor.  No significant changes otherwise. There is no pain with calf compression, erythema or warmth.   Assessment: Left hallux ulceration- hallux limitus  Plan:   Concern for osteomyelitis -Given this we discussed a bone biopsy of the left hallux.  We discussed the procedure as well as postoperative course.  Alternatives, risks, complications were discussed.  No promises or guarantees given.  She is still unfortunately risk of amputation although she is hoping to avoid this.  Consent was signed.  She was brought back to the procedure room and cleaned skin with alcohol.  Mixture of 3 mL of lidocaine , Marcaine  plain was infiltrated in a regional block fashion.  The left lower extremity scrubbed, prepped, draped in normal sterile fashion.  Timeout was performed.  A small stab incision was made medially along the hallux on the distal phalanx just adjacent to the area of the wound but not through the wound.  At this time a Jamshidi needle was utilized to remove a piece of bone.  This was sent to both pathology as well as microbiology.  There is no purulence noted.  I irrigated the incision with saline  hemostasis achieved.  Closed the incision with nylon.  Dressing applied.  She tolerated the procedure without any complications.  Postprocedure instructions discussed.  Monitor closely any signs or symptoms of infection.  Discussed keep the dressing on for 2 days and she can start to change the bandage he is on the same that she was doing prior to the procedure.  Continue cam boot immobilization.  Elevation limited weightbearing.  Vital signs remained stable throughout the procedure and she is afebrile today.  Return for toe ulcer in 7-10 days.  Donnice JONELLE Fees DPM

## 2024-12-01 NOTE — Progress Notes (Signed)
 Subjective: Chief Complaint  Patient presents with   Foot Ulcer    F/U for toe ulcer, MRI results . A1C 5.7 IDDM      58 year old female presents the office today for follow-up evaluation of a wound on her left foot.  States that she still gets bloody drainage.  No pus.  No present swelling or redness.  She does not endorse any fevers or chills.  She is continue with daily dressing changes.  She continues in the cam boot as well.  Last A1c was 6.5 on 05/25/2024  ROS: Negative except for otherwise mentioned above  Objective: AAO x3, NAD-presents walking in cam boot. DP/PT pulses palpable bilaterally, CRT less than 3 seconds  Sensation decreased with SWMF LEFT: Today is a granular wound which measures 1.5 x 1 x 0.2 cm. Wound base is healthy with mostly granulation tissue present on wound bed.  Is no probing to bone, undermining or tunneling.  There is no purulence noted today.  Some minimal edema present to the hallux without any ascending cellulitis.  No fluctuation or crepitation.  There is no malodor.  Decreased dorsiflexion of the first MTPJ There is no pain with calf compression, erythema or warmth.    Assessment: Left hallux ulceration- hallux limitus  Plan: Ulcer left foot - Sharply debrided the ulceration today with a #312 with scalpel down to healthy, bleeding tissue.  Removed nonviable, deep tissue in order promote wound healing.  Pre and post debridement measurements of the same.  Wound continue antibiotic ointment dressing changes daily.  Discussed daily dressing changes, offloading.  Concern for osteomyelitis -Reviewed the MRI.  Given concern for infection we will hold off on further antibiotics and recommended bone biopsy next week.  No plan to do this in the office.  We discussed the procedure doses postoperative course.  Alternatives, risks, complications were discussed.  No promises or guarantees given.  She is aware that she is at risk of amputation of the toe although  she states again today she does not want to have this performed she does not want to lose her toe.  If symptoms were to worsen or change prior to next appointment to let us  know we will restart antibiotics.  No follow-ups on file.  [name] DPM

## 2024-12-01 NOTE — Telephone Encounter (Signed)
 DOS- 12/01/2024  BIOPSY, BONE, TROCAR, OR NEEDLE; SUPERFICIAL LT- 20220  [employer]BS MEDICARE EFFECTIVE DATE- 12/11/2023  DEDUCTIBLE- N/A OOP- $5900 REMAINING- $2837.44 COINSURANCE- $5900 REMAINING- $2837.44  PER BCBS PORTAL, PRIOR AUTH IS NOT REQUIRED FOR CPT CODE 79779.

## 2024-12-02 ENCOUNTER — Ambulatory Visit (HOSPITAL_COMMUNITY)
Admission: RE | Admit: 2024-12-02 | Discharge: 2024-12-02 | Disposition: A | Discharge status: Home / Self Care | Source: Ambulatory Visit | Attending: Gastroenterology | Admitting: Gastroenterology

## 2024-12-02 DIAGNOSIS — Z1211 Encounter for screening for malignant neoplasm of colon: Secondary | ICD-10-CM | POA: Insufficient documentation

## 2024-12-02 DIAGNOSIS — R1319 Other dysphagia: Secondary | ICD-10-CM | POA: Insufficient documentation

## 2024-12-02 DIAGNOSIS — K582 Mixed irritable bowel syndrome: Secondary | ICD-10-CM | POA: Insufficient documentation

## 2024-12-02 DIAGNOSIS — K219 Gastro-esophageal reflux disease without esophagitis: Secondary | ICD-10-CM | POA: Insufficient documentation

## 2024-12-02 DIAGNOSIS — Z860100 Personal history of colon polyps, unspecified: Secondary | ICD-10-CM | POA: Diagnosis present

## 2024-12-02 DIAGNOSIS — K760 Fatty (change of) liver, not elsewhere classified: Secondary | ICD-10-CM | POA: Insufficient documentation

## 2024-12-04 ENCOUNTER — Other Ambulatory Visit: Payer: Self-pay

## 2024-12-04 NOTE — Addendum Note (Signed)
 Addended by: KOLEEN PERKINS F on: 12/04/2024 10:22 AM   Modules accepted: Orders

## 2024-12-04 NOTE — Telephone Encounter (Signed)
 Patient scheduled for Esophageal Manometry 03/17/2025 @ 8:30 AM  Case # [id number]

## 2024-12-04 NOTE — Telephone Encounter (Signed)
 Patient contacted with appointment information for Esophageal Manometry. 03/17/2025 @ 8:30 am    Instructions sent to patient in MyChart. Insulin  pump instruction request sent to Dr Nichole. Patient verbalized understanding.

## 2024-12-07 ENCOUNTER — Encounter: Payer: Self-pay | Admitting: Podiatry

## 2024-12-08 ENCOUNTER — Ambulatory Visit: Payer: Self-pay | Admitting: Podiatry

## 2024-12-08 LAB — ANAEROBIC AND AEROBIC CULTURE
AER RESULT:: NO GROWTH
MICRO NUMBER:: 17396999
MICRO NUMBER:: 17397000
SPECIMEN QUALITY:: ADEQUATE
SPECIMEN QUALITY:: ADEQUATE

## 2024-12-08 LAB — HOUSE ACCOUNT TRACKING

## 2024-12-11 ENCOUNTER — Ambulatory Visit: Admitting: Podiatry

## 2024-12-11 ENCOUNTER — Ambulatory Visit (HOSPITAL_BASED_OUTPATIENT_CLINIC_OR_DEPARTMENT_OTHER): Admitting: Pulmonary Disease

## 2024-12-11 VITALS — BP 109/64 | HR 102 | Temp 97.3°F | Ht 66.0 in | Wt 261.4 lb

## 2024-12-11 DIAGNOSIS — L97522 Non-pressure chronic ulcer of other part of left foot with fat layer exposed: Secondary | ICD-10-CM

## 2024-12-11 DIAGNOSIS — M86172 Other acute osteomyelitis, left ankle and foot: Secondary | ICD-10-CM

## 2024-12-11 NOTE — Progress Notes (Signed)
 Subjective: Chief Complaint  Patient presents with   Wound Check    RM 11 Patient is here for ulcer of the left hallux. Wound is open with moderate drainage.   59 year old female presents with her husband for follow-up evaluation status post bone biopsy as well as for follow-up evaluation of the wound.  She states that she is about finished with her antibiotics.  She does not report any fevers or chills in the EXTR being some drainage from the wound but it has improved.  No present swelling or redness.  She has no pain although she does have neuropathy.  Objective: AAO x3, NAD DP/PT pulses palpable bilaterally, CRT less than 3 seconds Sensation decreased with Semmes Weinstein monofilament.  Ulceration on the plantar aspect of the toe both pre and post wound measurements measured 1.3 x 0.9 x 0.2 cm.  Granulation tissue along the wound base.  There is no surrounding erythema, ascending cellulitis.  No fluctuation or crepitation.  There is no malodor. Biopsy site is well-healed. No pain with calf compression, swelling, warmth, erythema  Assessment: Chronic ulceration with concern for osteomyelitis  Plan: Osteomyelitis, status post bone biopsy - Both the bone culture as well as pathology were negative for osteomyelitis.  Discussed at this time no signs of infection however we need to continue to monitor as this is still a possibility and she is still at risk of amputation. - At this time clinically no signs of infection so we will see how she does off of antibiotics.  Low threshold for restarting them. - Trend blood work.  Likely will order blood work next appointment  Ulceration, chronic - Sharply debrided ulceration utilized #312 with scalpel to remove all nonviable, devitalized tissue in order promote wound healing.  Pre and posterior measurements were the same and noted above.  There is minimal blood loss.  Hemostasis achieved through renal compression.  Silvadene  applied today followed by  dressing.  Continue daily dressing changes, offloading at all times.  No follow-ups on file.  Donnice JONELLE Fees DPM    -Patient encouraged to call the office with any questions, concerns, change in symptoms.

## 2024-12-14 LAB — COLOGUARD: COLOGUARD: NEGATIVE

## 2024-12-28 ENCOUNTER — Ambulatory Visit (HOSPITAL_BASED_OUTPATIENT_CLINIC_OR_DEPARTMENT_OTHER): Admitting: Pulmonary Disease

## 2024-12-28 ENCOUNTER — Encounter (HOSPITAL_BASED_OUTPATIENT_CLINIC_OR_DEPARTMENT_OTHER): Payer: Self-pay | Admitting: Pulmonary Disease

## 2024-12-28 VITALS — BP 138/72 | HR 94 | Ht 66.0 in | Wt 259.5 lb

## 2024-12-28 DIAGNOSIS — R1310 Dysphagia, unspecified: Secondary | ICD-10-CM

## 2024-12-28 DIAGNOSIS — G6181 Chronic inflammatory demyelinating polyneuritis: Secondary | ICD-10-CM

## 2024-12-28 DIAGNOSIS — J9611 Chronic respiratory failure with hypoxia: Secondary | ICD-10-CM

## 2024-12-28 DIAGNOSIS — G4733 Obstructive sleep apnea (adult) (pediatric): Secondary | ICD-10-CM

## 2024-12-28 MED ORDER — FLUTICASONE FUROATE-VILANTEROL 200-25 MCG/ACT IN AEPB: 1.0000 | INHALATION_SPRAY | Freq: Every day | RESPIRATORY_TRACT | 6 refills | Status: AC

## 2024-12-28 MED ORDER — ALBUTEROL SULFATE HFA 108 (90 BASE) MCG/ACT IN AERS: 2.0000 | INHALATION_SPRAY | Freq: Four times a day (QID) | RESPIRATORY_TRACT | 11 refills | Status: AC | PRN

## 2024-12-28 NOTE — Patient Instructions (Signed)
" °  VISIT SUMMARY: During your visit, we discussed your chronic conditions, including CIDP, chronic diaphragmatic weakness, obstructive sleep apnea, and chronic respiratory failure. We also addressed your swallowing difficulties and the impact of your right toe ulcer on your mobility. We reviewed your current treatments and made some recommendations to help manage your symptoms and improve your comfort.  YOUR PLAN: -CHRONIC INFLAMMATORY DEMYELINATING POLYNEUROPATHY (CIDP) WITH CHRONIC DIAPHRAGMATIC WEAKNESS, OBSTRUCTIVE SLEEP APNEA, AND CHRONIC RESPIRATORY FAILURE WITH HYPOXIA: CIDP is a neurological disorder that causes progressive weakness and impaired sensory function in the legs and arms. Your chronic diaphragmatic weakness, obstructive sleep apnea, and chronic respiratory failure with hypoxia are being managed with BiPAP and oxygen therapy at night, which you should continue. Your IG infusion regimen is effective in maintaining your strength. We discussed trying a new mask design to improve comfort and reduce nasal irritation.  -DYSPHAGIA: Dysphagia means difficulty swallowing. You have been experiencing this along with nausea and vomiting, likely due to an esophageal motility issue. You are scheduled for further testing in April to assess this. In the meantime, you should eat slowly and in small meals to help manage your symptoms.  INSTRUCTIONS: Please continue with your current BiPAP and oxygen therapy at night. Consider trying a new mask design for better comfort. Continue your IG infusion regimen as it is helping maintain your strength. For your swallowing difficulties, proceed with the scheduled esophageal motility test in April and remember to eat slowly and in small meals. Follow up with us  after your tests or sooner if you experience any new or worsening symptoms.    Contains text generated by [employer].   "

## 2024-12-28 NOTE — Addendum Note (Signed)
 Addended by: TRUDY WARREN CROME on: 12/28/2024 04:41 PM   Modules accepted: Orders

## 2024-12-28 NOTE — Progress Notes (Signed)
 "  Subjective:    Patient ID: [name], female    DOB: 05/11/1966, 59 y.o.   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      PMH -CIDP as above, dysautonomia Rob[place]) , orthostatic hypotension with syncope and multiple falls, proteinuria/CKD [place] ) , hypertension, thyroid  cancer status post thyroidectomy , fibromyalgia , IDDM on insulin  pump    Discussed the use of AI scribe software for clinical note transcription with the patient, who gave verbal consent to proceed.  History of Present Illness [name] is a 59 year old female with CIDP, chronic diaphragmatic weakness, and OSA with chronic respiratory failure who presents for follow-up.  She uses BiPAP and oxygen at night, which improves her breathing. She feels stronger overall and is trying to maintain strength to avoid wheelchair dependence, though episodes of low blood pressure sometimes limit her mobility.  She has a right toe ulcer requiring off-loading, which alters her gait and contributes to hip and back discomfort. She alternates between a boot, cane, and walker, which limits how far she can walk.  With longer walks, such as in larger stores, she develops fatigue and increased heart rate. She tolerates shorter weekly trips to nearby stores.  She has difficulty swallowing with nausea from esophageal motility problems. A recent barium swallow showed poor esophageal transit, and she is awaiting further motility testing.  She uses Breo and albuterol  as part of her current respiratory regimen.   BiPAP DL reviewed Esophagram - shows web + dysmotility  Significant tests/ events reviewed   07/2023 OV >>She desaturated to 86% on  walking and recovered with 2 L of oxygen   PAP titration 08/14/22 biPAP 24/20 , CPAP was titrated to 18 cm but could not toelrate due to persistent hypoxia, RERAs & hypopneas    ONO on CPAP on 06/03/2022 showed patient spent 8 minutes with SPO2 less than 88%. SPO2 low 82% with average 91%.     6-MWT 04/16/2022, baseline SPO2 low was 97% on room air. After 2-minute post walk SPO2 was 96% on room air.    HST 01/2021 mild OSA, AHI 7/h 11/2020 Sniff test decreased excursion of the right hemidiaphragm  PFT 12/2020 moderate reversible obstruction, ratio 85, FEV1 57%, FVC 62%, significant bronchodilator response improved by 0.54 to 81%, TLC 80%, DLCO 108%   Chest x-ray 11/18/2020 shows low volumes elevated right hemidiaphragm. Chest x-ray 02/2015 also shows lower volumes and similar mild elevation of right hemidiaphragm  Review of Systems  neg for any significant sore throat, dysphagia, itching, sneezing, nasal congestion or excess/ purulent secretions, fever, chills, sweats, unintended wt loss, pleuritic or exertional cp, hempoptysis, orthopnea pnd or change in chronic leg swelling. Also denies presyncope, palpitations, heartburn, abdominal pain, nausea, vomiting, diarrhea or change in bowel or urinary habits, dysuria,hematuria, rash, arthralgias, visual complaints, headache, numbness weakness or ataxia.      Objective:   Physical Exam  Gen. Pleasant, obese, in no distress ENT - no lesions, no post nasal drip Neck: No JVD, no thyromegaly, no carotid bruits Lungs: no use of accessory muscles, no dullness to percussion, decreased without rales or rhonchi  Cardiovascular: Rhythm regular, heart sounds  normal, no  murmurs or gallops, no peripheral edema Musculoskeletal: No deformities, no cyanosis or clubbing , no tremors Ambulates with cane       Assessment & Plan:   Assessment and Plan Assessment & Plan Chronic inflammatory demyelinating polyneuropathy (CIDP) with chronic diaphragmatic weakness,  obstructive sleep apnea, and chronic respiratory failure with hypoxia CIDP with chronic diaphragmatic weakness, obstructive sleep apnea, and chronic respiratory failure with hypoxia. Reports improvement in breathing with cooler weather and consistent use of BiPAP and oxygen at night. BiPAP compliance is good with low residual events. Oxygen use improves sleep quality and reduces events. No major flare-ups in the past two to three years. IG infusion regimen appears effective in maintaining strength and preventing decline. Discussed potential mask adjustment to improve comfort and reduce nasal irritation. - Continue BiPAP therapy with current settings. - Continue oxygen therapy at night. - Consider trial of new mask design to improve comfort and reduce nasal irritation. - Continue current IG infusion regimen.  Dysphagia Reports difficulty swallowing and sensation of food not passing, leading to nausea and vomiting. Previous barium swallow study indicated possible esophageal motility issue. Scheduled for further testing in April to assess esophageal motility. Advised to eat slowly and in small meals to manage symptoms. - Proceed with scheduled esophageal motility test in April. - Advised to eat slowly and in small meals to manage symptoms.     "

## 2025-01-01 ENCOUNTER — Ambulatory Visit: Admitting: Podiatry

## 2025-01-01 ENCOUNTER — Ambulatory Visit

## 2025-01-01 DIAGNOSIS — L97522 Non-pressure chronic ulcer of other part of left foot with fat layer exposed: Secondary | ICD-10-CM | POA: Diagnosis not present

## 2025-01-01 NOTE — Progress Notes (Signed)
 Subjective: Chief Complaint  Patient presents with   Foot Ulcer    IDDM Patient with an A1c 5.7 of  presents today for F/U on her diabetic ulcer of toe of left foot associated with diabetes mellitus reports little to no drainage.    59 year old female presents with her husband for follow-up evaluation status post bone biopsy as well as for follow-up evaluation of the wound.  States that she has been doing well and has been less drainage.  She been using Silvadene , collagen on the wound daily.  She does not report any increase in swelling or redness.  No fevers or chills and she has no other concerns today.   Objective: AAO x3, NAD-presents wearing regular shoe  DP/PT pulses palpable bilaterally, CRT less than 3 seconds Sensation decreased with Semmes Weinstein monofilament.  Ulceration on the plantar aspect of the hallux post wound measurements measured 1.1 x 0.7 x 0.1 cm.  Granulation tissue along the wound base.  There is no surrounding erythema, ascending cellulitis.  No fluctuation or crepitation.  There is no malodor.  Clinically the wound is improving. Biopsy site is well-healed. No pain with calf compression, swelling, warmth, erythema     Assessment: Chronic ulceration with concern for osteomyelitis  Plan:  Radiology -X-rays were obtained reviewed.  Multiple views obtained.  There is no specific cortical destruction suggest osteomyelitis.  There is some haziness around the wound which could be result of the biopsy, wound.   Osteomyelitis, status post bone biopsy - Appears to be stable.  Continue to monitor. Ulceration, chronic  Ulceration, left hallux -Procedure: Excisional Debridement of Wound Tool: Sharp #312 chisel blade/tissue nipper Type of Debridement: Sharp Excisional Frequency: @periodically  until appropriately healed.  Dressing is to be changed daily/keeping the wound clean and dry Rationale: Removal of non-viable soft tissue from the wound to promote healing.   Anesthesia: none Pre-Debridement Wound Measurements: 1 cm x 0.6 cm x 0.1 cm  Post-Debridement Wound Measurements: 1.1 cm x 0.7 cm x 0.1 cm  Area devitalized tissue removed(nonviable tissue only): 1.1 cm x 0.7 cm.  Blood loss: Minimal (<50cc) Depth of Debridement: with fat layer exposed Description of tissue removed: Devitalized Tissue and Non-viable tissue Technique: The wound and the surrounding skin were prepped and draped in usual aseptic fashion.  Aseptic technique was maintained throughout the procedure.  Using #312 blade/tissue nipper sharp debridement of necrotic/nonviable tissue was performed until healthy bleeding wound bed was achieved.  No underlying bone or tendon was exposed during debridement.  The wound was thoroughly irrigated with normal saline solution Wound Progress:  Current Wound Volume: Debridement was performed of the chronic nonhealing diabetic foot wound on left foot Toe left, great toe on the left.  Debridement removed 1.1 cm x 0.7 cm of the necrotic tissue and subcutaneous tissue and none amount of purulent drainage was not present. Presence/absence of tissue: Necrotic tissue/nonviable tissue present at the base of the wound.  Sharp debridement was performed to remove the necrotic tissue/nonviable tissue back to viable tissue.  No devitalized/nonviable tissue present postdebridement.  Wound appeared clean and clear of infection No material in the wound was present that was identified to be inhibiting healing. Dressing: Dry, sterile, compression dressing. Disposition: Patient tolerated procedure well. Patient to return in 1 week for follow-up or as listed above.   No follow-ups on file.  Donnice JONELLE Fees DPM

## 2025-01-06 ENCOUNTER — Other Ambulatory Visit: Payer: Self-pay | Admitting: Gastroenterology

## 2025-01-21 ENCOUNTER — Ambulatory Visit: Admitting: Podiatry

## 2025-03-17 ENCOUNTER — Ambulatory Visit (HOSPITAL_COMMUNITY): Admit: 2025-03-17 | Admitting: Gastroenterology

## 2025-03-17 ENCOUNTER — Encounter (HOSPITAL_COMMUNITY): Payer: Self-pay

## 2025-03-17 SURGERY — MANOMETRY, ESOPHAGUS

## 2025-04-16 ENCOUNTER — Ambulatory Visit: Admitting: Student
